# Patient Record
Sex: Female | Born: 1965 | Race: White | Hispanic: No | Smoking: Current every day smoker
Health system: Southern US, Community
[De-identification: ages and names within clinical notes are randomized; demographics above are authoritative.]

## PROBLEM LIST (undated history)

## (undated) DIAGNOSIS — Z9889 Other specified postprocedural states: Secondary | ICD-10-CM

## (undated) DIAGNOSIS — G8929 Other chronic pain: Secondary | ICD-10-CM

## (undated) DIAGNOSIS — G894 Chronic pain syndrome: Secondary | ICD-10-CM

## (undated) DIAGNOSIS — M546 Pain in thoracic spine: Secondary | ICD-10-CM

## (undated) DIAGNOSIS — S22070A Wedge compression fracture of T9-T10 vertebra, initial encounter for closed fracture: Secondary | ICD-10-CM

## (undated) DIAGNOSIS — I051 Rheumatic mitral insufficiency: Secondary | ICD-10-CM

## (undated) DIAGNOSIS — M542 Cervicalgia: Secondary | ICD-10-CM

## (undated) DIAGNOSIS — I340 Nonrheumatic mitral (valve) insufficiency: Secondary | ICD-10-CM

## (undated) DIAGNOSIS — S22070G Wedge compression fracture of T9-T10 vertebra, subsequent encounter for fracture with delayed healing: Secondary | ICD-10-CM

## (undated) DIAGNOSIS — S22000S Wedge compression fracture of unspecified thoracic vertebra, sequela: Secondary | ICD-10-CM

## (undated) DIAGNOSIS — Z1231 Encounter for screening mammogram for malignant neoplasm of breast: Secondary | ICD-10-CM

## (undated) DIAGNOSIS — M79642 Pain in left hand: Secondary | ICD-10-CM

## (undated) DIAGNOSIS — F418 Other specified anxiety disorders: Secondary | ICD-10-CM

## (undated) DIAGNOSIS — Z0000 Encounter for general adult medical examination without abnormal findings: Secondary | ICD-10-CM

## (undated) DIAGNOSIS — R0602 Shortness of breath: Secondary | ICD-10-CM

## (undated) DIAGNOSIS — M545 Low back pain, unspecified: Secondary | ICD-10-CM

## (undated) DIAGNOSIS — M810 Age-related osteoporosis without current pathological fracture: Secondary | ICD-10-CM

## (undated) DIAGNOSIS — J449 Chronic obstructive pulmonary disease, unspecified: Secondary | ICD-10-CM

## (undated) DIAGNOSIS — I2510 Atherosclerotic heart disease of native coronary artery without angina pectoris: Secondary | ICD-10-CM

## (undated) DIAGNOSIS — R911 Solitary pulmonary nodule: Secondary | ICD-10-CM

## (undated) DIAGNOSIS — Z8781 Personal history of (healed) traumatic fracture: Secondary | ICD-10-CM

## (undated) DIAGNOSIS — S22050A Wedge compression fracture of T5-T6 vertebra, initial encounter for closed fracture: Principal | ICD-10-CM

## (undated) DIAGNOSIS — Z01818 Encounter for other preprocedural examination: Secondary | ICD-10-CM

## (undated) DIAGNOSIS — M5441 Lumbago with sciatica, right side: Secondary | ICD-10-CM

## (undated) DIAGNOSIS — M65311 Trigger thumb, right thumb: Secondary | ICD-10-CM

## (undated) DIAGNOSIS — M8008XA Age-related osteoporosis with current pathological fracture, vertebra(e), initial encounter for fracture: Principal | ICD-10-CM

## (undated) DIAGNOSIS — S22000A Wedge compression fracture of unspecified thoracic vertebra, initial encounter for closed fracture: Principal | ICD-10-CM

## (undated) DIAGNOSIS — M5137 Other intervertebral disc degeneration, lumbosacral region: Secondary | ICD-10-CM

## (undated) DIAGNOSIS — G43009 Migraine without aura, not intractable, without status migrainosus: Secondary | ICD-10-CM

## (undated) DIAGNOSIS — M51379 Other intervertebral disc degeneration, lumbosacral region without mention of lumbar back pain or lower extremity pain: Secondary | ICD-10-CM

## (undated) DIAGNOSIS — R10A2 Flank pain, left side: Secondary | ICD-10-CM

## (undated) DIAGNOSIS — F319 Bipolar disorder, unspecified: Secondary | ICD-10-CM

## (undated) DIAGNOSIS — M1990 Unspecified osteoarthritis, unspecified site: Secondary | ICD-10-CM

## (undated) DIAGNOSIS — F4310 Post-traumatic stress disorder, unspecified: Secondary | ICD-10-CM

## (undated) DIAGNOSIS — R109 Unspecified abdominal pain: Secondary | ICD-10-CM

## (undated) DIAGNOSIS — I509 Heart failure, unspecified: Secondary | ICD-10-CM

## (undated) DIAGNOSIS — E7800 Pure hypercholesterolemia, unspecified: Secondary | ICD-10-CM

## (undated) DIAGNOSIS — I4891 Unspecified atrial fibrillation: Secondary | ICD-10-CM

## (undated) HISTORY — PX: VALVE REPLACEMENT: SUR13

## (undated) HISTORY — PX: TUBAL LIGATION: SHX77

## (undated) HISTORY — DX: Heart failure, unspecified: I50.9

## (undated) HISTORY — PX: KNEE ARTHROSCOPY: SUR90

## (undated) HISTORY — PX: CERVICAL SPINE SURGERY: SHX589

## (undated) HISTORY — DX: Pure hypercholesterolemia, unspecified: E78.00

## (undated) HISTORY — PX: ABDOMINAL HYSTERECTOMY: SHX81

## (undated) HISTORY — PX: OOPHORECTOMY: SHX86

## (undated) HISTORY — DX: Unspecified atrial fibrillation: I48.91

---

## 2011-11-09 DIAGNOSIS — J309 Allergic rhinitis, unspecified: Secondary | ICD-10-CM | POA: Insufficient documentation

## 2013-09-25 DIAGNOSIS — R911 Solitary pulmonary nodule: Secondary | ICD-10-CM

## 2013-09-25 HISTORY — DX: Solitary pulmonary nodule: R91.1

## 2013-10-09 DIAGNOSIS — F17200 Nicotine dependence, unspecified, uncomplicated: Secondary | ICD-10-CM | POA: Insufficient documentation

## 2015-06-12 DIAGNOSIS — E785 Hyperlipidemia, unspecified: Secondary | ICD-10-CM | POA: Insufficient documentation

## 2015-07-05 DIAGNOSIS — I5032 Chronic diastolic (congestive) heart failure: Secondary | ICD-10-CM | POA: Insufficient documentation

## 2015-07-10 DIAGNOSIS — I272 Pulmonary hypertension, unspecified: Secondary | ICD-10-CM

## 2015-07-10 HISTORY — DX: Pulmonary hypertension, unspecified: I27.20

## 2015-08-24 DIAGNOSIS — J449 Chronic obstructive pulmonary disease, unspecified: Secondary | ICD-10-CM | POA: Insufficient documentation

## 2015-10-01 DIAGNOSIS — I052 Rheumatic mitral stenosis with insufficiency: Secondary | ICD-10-CM

## 2015-10-01 HISTORY — DX: Rheumatic mitral stenosis with insufficiency: I05.2

## 2015-10-09 NOTE — Anesthesia Post-Procedure Evaluation (Signed)
Formatting of this note might be different from the original.    Patient: Tracie HarrierKaren C Laurel  * No procedures listed *  Anesthesia type: MAC    Patient location:  Phase II  Patient participation:  Patient able to participate in this evaluation at age appropriate level.    Vital signs reviewed and can be found in nursing documentation.    Post vital signs:   stable  Level of consciousness:   awake, alert and oriented    Post-anesthesia pain:   adequate analgesia  Airway patency:   patent  Respiratory:   unassisted, respiration function adequate, spontaneous ventilation, nasal cannula  Cardiovascular:   stable, blood pressure acceptable and heart rate acceptable  Hydration:   adequate hydration  Temperature: temperature adequate >96.72F  PONV:  nausea and vomiting controlled  Regional anesthesia: no block performed    Anesthetic complications:   no  Electronically signed by Kipp BroodLorie A Lisenby, CRNA at 10/09/2015  9:22 AM EDT

## 2015-10-09 NOTE — Anesthesia Pre-Procedure Evaluation (Signed)
Formatting of this note is different from the original.  Images from the original note were not included.  Past Medical History:   Diagnosis Date   ? Acid reflux    ? Allergy    ? Anxiety    ? Asthma    ? Atrial fibrillation (*)    ? Bipolar 1 disorder (*)    ? CHF (congestive heart failure) (*)    ? COPD (chronic obstructive pulmonary disease) (*)    ? DDD (degenerative disc disease)    ? DDD (degenerative disc disease), cervical    ? Diverticulitis    ? H/O steroid therapy    ? Mitral valve disease    ? Osteoporosis    ? Osteoporosis    ? Sciatic nerve pain    ? Scoliosis    ? Scoliosis    ? Seasonal allergies    ? Teeth missing     no uppper teeth     Past Surgical History:   Procedure Laterality Date   ? Cesarean section     ? Hysterectomy     ? Knee surgery     ? Neck surgery       Patient Active Problem List   Diagnosis   ? Allergy   ? Panlobular emphysema (*)   ? Asthma   ? Anxiety   ? Bipolar 1 disorder (*)   ? Allergic rhinitis   ? Antral gastritis   ? Chronic back pain   ? Onychomycosis   ? Nodule of right lung   ? Back pain with right-sided sciatica   ? Nicotine dependence   ? Right-sided low back pain without sciatica   ? Onycholysis   ? Dyslipidemia   ? Paroxysmal atrial fibrillation (*)   ? Diastolic CHF, chronic (*)   ? Moderate to severe pulmonary hypertension (*)   ? COPD (chronic obstructive pulmonary disease) (*)   ? A-fib (*)   ? Non-rheumatic mitral regurgitation   ? Mitral regurgitation and mitral stenosis     Social History   Substance Use Topics   ? Smoking status: Current Every Day Smoker     Packs/day: 0.25     Types: Cigarettes     Start date: 09/26/1978   ? Smokeless tobacco: Former Neurosurgeon     Quit date: 09/27/2015      Comment: 6 per day- smoking cessation sheet and [place] QuitNow number given to patient   ? Alcohol use No     Lab Results   Component Value Date    WBC 8.0 10/07/2015    HGB 9.5 (L) 10/07/2015    HCT 28.1 (L) 10/07/2015    Plt Ct 198 10/07/2015    CHOLESTEROL TOTAL 252 (H)  09/24/2015    Trig 154 (H) 09/24/2015    HDL 63 09/24/2015    LDL 158 (H) 09/24/2015    ALT 12 09/24/2015    AST 18 09/24/2015    Na 139 10/08/2015    Potassium 3.7 10/08/2015    Cl 99 10/08/2015    Creatinine 0.80 10/08/2015    BUN 8 10/08/2015    CO2 27 10/08/2015    TSH 3.85 08/25/2015    INR 2.71 10/09/2015    Glucose 98 10/08/2015    Hgb A1C 5.1 09/24/2015    Microalbum.,U,Random 25.9 06/12/2015     Ecg 12 Lead    Result Date: 08/25/2015  Ventricular Rate 178 Atrial Rate 197 QRS Duration 80 Q-T Interval 228 QTC Calculation(Bezet) 392 Calculated R  Axis 61 Calculated T Axis 123 Diagnosis #### Critical Test Result: High HR Atrial fibrillation with rapid ventricular response Septal infarct , age undetermined Abnormal ECG When compared with ECG of 04-Jul-2015 14:34, No significant change was found [name], [name] [phone]) on 08/25/2015 10:47:19 AM certifies that he/she has reviewed the ECG tracing and confirms the  independent interpretation is correct.    Ecg 12-lead    Result Date: 08/25/2015  Ventricular Rate 122 Atrial Rate 244 QRS Duration 92 Q-T Interval 344 QTC Calculation(Bezet) 490 Calculated P Axis -100 Calculated R Axis 51 Calculated T Axis -98 Diagnosis Atrial flutter with 2:1 AV conduction Marked ST abnormality, possible inferior subendocardial injury Abnormal ECG When compared with ECG of 24-Aug-2015 12:12, Atrial flutter has replaced Atrial fibrillation Criteria for Septal infarct are no longer present ST more depressed in Inferior leads ST more depressed in Anterior leads T wave inversion now evident in Inferior leads    Ecg 12 Lead    Result Date: 07/05/2015  Ventricular Rate 148 Atrial Rate 163 QRS Duration 98 Q-T Interval 260 QTC Calculation(Bezet) 408 Calculated R Axis 52 Calculated T Axis -124 Diagnosis Atrial fibrillation with rapid ventricular response Nonspecific ST and T wave abnormality Abnormal ECG When compared with ECG of 27-Sep-2013 15:08, Atrial fibrillation has replaced Sinus rhythm  Vent. rate has increased BY  56 BPM Nonspecific T wave abnormality now evident in Inferior leads Nonspecific T wave abnormality now evident in Lateral leads [name], [name] ([phone]) on 07/05/2015 9:44:29 AM certifies that he/she has reviewed the ECG tracing and confirms the independent  interpretation is correct.    Ecg 12 Lead Unit Performed    EKG: agree with computer interpretation.  unchanged from previous tracings, normal sinus rhythm, nonspecific ST and T waves changes, likely lvh, left atrial enlargement.     Ecg 12 Lead Unit Performed    EKG: agree with computer interpretation.  unchanged from previous tracings, normal sinus rhythm, nonspecific ST and T waves changes, likely lvh.     Results for orders placed or performed during the hospital encounter of 10/01/15   TEE during CVOR/CATH/EP    Narrative                                        [facility]                                          [address]                                        [address]                                         [id number]                                       [place],                                       [place] [phone]                      CVOR Transesophageal Echocardiogram  Name: Mary, [name] Date: 10/01/2015 09:40 AM  MRN: [id number]            Patient Location: [facility] CVRU^[id number]  DOB: 1965-08-14          Gender: Female  Age: 50 yrs              Race: White or Caucasian  Reason For Study: MV disorders (424.0)    Procedure/Quality  A complete transesophageal echocardiogram was performed (2D, Doppler, and  color flow Doppler). Intraoperative TEE was performed. The Intraoperative  TEE was used to guide the surgical procedure.    Procedure  Summary  Preop Diagnosis: Rheumatic valvuar heart diseaes.  Postop Diagnosis: S/P bioprosthetic MVR.  Procedure: Transesophageal echocardiogram.  Assistants: None.  Estimated Blood Loss: n/a.  Anesthesia: General anesthesia managed by anesthetist/anesthesiologist.  Complications: None.    Interpretation Summary  1. 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  2/ Preserved biventricular function. The left ventricular function is in  the 60% range.    Left Ventricle  The left ventricle is normal in size. There is no thrombus. There is normal  left ventricular wall thickness. Left ventricular systolic function is  normal. Contractile pattern is otherwise normal.    Right Ventricle  The right ventricle is normal size. The right ventricular ejection fraction  is normal.    Atria  The left atrium is severely dilated. No thrombus is detected in the left  atrial appendage. Intact septum. The right atrium is normal.    Mitral Valve  PRE: The mitral valve is grossly abnormal. It is thickened. The posterior  leaflet is tethered. The anterior leaflet has a"hockey stick" deformity c/w  rheumatic heart disease. There is 3+ MR and mild MS    POST: S/P bioprosthetic MVR with normal function.    Tricuspid Valve  The tricuspid valve is not well visualized, but is grossly normal. There is  trace  tricuspid regurgitation.    Aortic Valve  The valve is tricuspid with mild commisural thcikening. There are two jets  of AI that appear 1-2+. No AS seen.    Vessels  The aortic root is normal. The descending aorta is normal. The pulmonary  artery is normal.    Pericardium  There is no pericardial effusion.    ____________________________________________________________________________    Electronically signed by:  Lorna Dibble, M.D.  on 10/02/2015 04:56 PM  Ordering Physician: [id number]^Y Q^^^^^MV  Referring Physician: Hazel Sams  Performed By: Lorna Dibble., M.D.    Results for orders placed  or performed during the hospital encounter of 08/24/15   Echocardiogram Limited    Narrative                                                [facility]                                           [address]                                           [place], [place] [address]                                              [phone]    Adult Echocardiogram  Name: Mary [name], BOUTWELL Date: 08/25/2015 10:45 AM    BP: 117/77 mmHg  MRN: [id number]            Patient Location: [facility] ZDG^[id number]  DOB: Apr 13, 1966          Gender: Female                     Height: 64 in  Age: 53 yrs              Race: White or Caucasian           Weight: 205 lb  Reason For Study: Evaluate mitral valve                                                              BSA: 2.0 m2  Ordering Physician: [id number]^KAMDAR^APUR^R^^^^^EPI  Performed By: Kizzie Ide    Procedure/Quality  A limited 2D echo was performed. The image quality of this study was  technically adequate. The patient is in normal sinus rhythm.    Interpretation Summary  Limited echocardiogram assessement of mitral valve.    1. Left ventricle is normal in size and  function.  2. Mild to moderate mitral stenosis. Calculated MVA is 1.64 cm2 by pressure  half-time. Mean gradient is 7 mmHg. Moderate to moderately severe (2-3+)  mitral regurgitation.  3. Aortic valve is trileaflet with eccentric mild (1+) aortic  regurgitation. May be underestimated given eccentricity of regurgitant jet.    Left Ventricle  The left ventricle is normal in size. There is normal left ventricular wall  thickness. Left ventricular systolic function is normal. Ejection Fraction  = >55%.    Mitral Valve  Thickened leaflets with hockey-stick deformity of the anterior leaflet and  decreased excursion. There is mild mitral stenosis. There is moderate  mitral regurgitation (2+).    Pericardium/Pleural  There is a trivial pericardial effusion of no  hemodynamic significance.    MMode/2D Measurements & Calculations  IVSd: 0.99 cm           LVIDd:      IVS/LVPW: 0.99     Ao root diam:                          5.6 cm      FS: 38.9 %         2.5 cm                          LVIDs:      EDV(Teich):        Ao root area:                          3.4 cm      155.7 ml           4.9 cm2                          LVPWd:      EF(Teich): 68.6 %  LA dimension:                          1.0 cm                         5.0 cm            _____________________________________________________________  LVOT diam: 2.0 cm  LVOT area(traced):  3.1 cm2    Doppler Measurements & Calculations  MV E max vel:       MV max PG:     MV P1/2t max vel:     LV V1 max PG:  181.8 cm/sec        18.3 mmHg      211.7 cm/sec          7.4 mmHg  MV A max vel:       MV mean PG:    MV P1/2t: 133.7 msec  LV V1 mean PG:  80.2 cm/sec         6.5 mmHg       MVA(P1/2t): 1.6 cm2   3.8 mmHg  MV E/A: 2.3         MVA(VTI):      MV dec slope:         LV V1 max:                      1.2 cm2        463.8 cm/sec2  135.9 cm/sec                                     MV dec time: 0.40 sec LV V1 mean:                                                           89.3 cm/sec                                                           LV V1 VTI: 27.0 cm          _____________________________________________________________    SV(LVOT): 82.2 ml    ____________________________________________________________________________    Electronically signed [id number] Meadows  on 08/25/2015 11:41 AM    Transesophageal Echocardiogram    Narrative                                                [facility]                                           [address]                                           [place], [place] [address]                                              [phone]    Transesophageal Echocardiogram  Name: KAORU, BENDA Date: 08/26/2015 09:54 AM     BP:  125/68 mmHg  MRN: [id number]           Patient Location: [facility] W4XL^[id number]  DOB: 09-26-1965         Gender: Female                      Height: 64 in  Age: 59 yrs             Race: White or Caucasian            Weight: 205 lb  Reason For Study: MV disorders (424.0)  BSA: 2.0 m2  Ordering Physician: [id number]^JYNW^G^^^^^NFA  Performed By: Rod Holler    Procedure/Quality A complete transesophageal echocardiogram was performed  (2D, Doppler, and color flow Doppler). The image quality of this study was  technically adequate. The probe was swallowed without complications.  Patient underwent a transesophageal echocardiogram under sedation.    Interpretation Summary  The left ventricle is normal in size.  Left ventricular systolic function is normal.  The right ventricle is normal size.  The right ventricular ejection fraction is normal.  Saline contrast injected, no interatrial shunt.  The mitral valve leaflets appear thickened, but open well.  The mitral valve appears rheumatic.  There is severe mitral regurgitation (4+).  There is mild tricuspid regurgitation (1+).  The aortic valve opens well.  The aortic valve is tri-leaflet.  There is moderate aortic regurgitation (2+).  2 eccentric jets from right coronary cusp, 1 directed anteriorly, 1  directed posteriorly  _______________________________________    Left Ventricle  The left ventricle is normal in size. Left ventricular systolic function is  normal.    Right Ventricle  The right ventricle is normal size. The right ventricular ejection fraction  is normal.    Atria  Saline contrast injected, no interatrial shunt. The right atrium is normal.    Mitral Valve  The mitral valve leaflets appear thickened, but open well. The mitral valve  appears rheumatic.There is severe mitral regurgitation (4+).    Tricuspid Valve  The tricuspid valve leaflets are thin and pliable and the valve motion is  normal.  There is mild tricuspid regurgitation (1+).    Aortic Valve  The aortic valve opens well. The aortic valve is tri-leaflet. There is  moderate aortic regurgitation (2+). 2 eccentric jets from right coronary  cusp, 1 directed anteriorly, 1 directed posteriorly.    Pulmonic Valve  The pulmonic valve leaflets are thin and pliable; valve motion is normal.    Vessels  The aortic root is normal.    Pericardium  There is no pericardial effusion.    ____________________________________________________________________________    Electronically signed OZ:[id number] Kamdar  on 08/26/2015 01:18 PM    Results for orders placed or performed during the hospital encounter of 07/04/15   Echocardiogram 2D Complete    Narrative                                                [facility]                                           [address]                                           [place], [place] [address]  [phone]    Adult Echocardiogram  Name: Mary [name], PANIK Date: 07/05/2015 08:08 AM    BP: 124/68 mmHg  MRN: [id number]            Patient Location: [facility] IMU^[id number]  HR: 66  DOB: [birth year]          Gender: Female                     Height: 65 in  Age: 3 yrs              Race: White or Caucasian           Weight: 202 lb  Reason For Study: Atrial fibrillation (427.31)                                                              BSA: 2.0 m2  Ordering Physician: [id number]^[id number]^^^  Performed By: Merry Lofty    Procedure/Quality  A complete two-dimensional transthoracic echocardiogram was performed (2D,  M-mode, Doppler and color flow Doppler). The image quality of this study  was technically adequate. The patient is in normal sinus rhythm.    Interpretation Summary  Left ventricular systolic function is normal.  The left ventricle is mildly dilated.  Grade II Diastolic Dysfunction (moderate increase in filling  pressure).  LA Volume is c/w moderately dilated (42 ml/m2- 48 ml/m2)  Mild to moderate mitral stenosis  There is moderate mitral regurgitation (2+).  There is mild tricuspid regurgitation (1+).  Right ventricular systolic pressure is elevated at 40-46mm Hg, consistent  with moderate pulmonary hypertension.  Mild aortic regurgitation.    Left Ventricle  The left ventricle is mildly dilated. There is no thrombus. There is normal  left ventricular wall thickness. The left ventricular ejection fraction is  grossly normal. Ejection Fraction = >55%. Grade II Diastolic Dysfunction  (moderate increase in filling pressure). Left ventricular systolic function  is normal. The left ventricular wall motion is normal.    Right Ventricle  The right ventricle is normal size. The right ventricular ejection fraction  is grossly normal.    Atria  LA Volume is c/w moderately dilated (42 ml/m2- 48 ml/m2). The right atrium  is mildly dilated. The interatrial septum is intact with no evidence for an  atrial septal defect.    Mitral Valve  The mitral valve leaflets appear thickened, but open well. The Mean Mitral  Valve Gradient is 11 mmHg. The Mitral Valve Area by Pressure 1/2 time is  1.95 cm2. Mild to moderate mitral stenosis. There is moderate mitral  regurgitation (2+).    Tricuspid Valve  The tricuspid valve leaflets are thin and pliable and the valve motion is  normal. There is no tricuspid valve vegetation. There is no tricuspid  stenosis. There is mild tricuspid regurgitation (1+). Right ventricular  systolic pressure is elevated at 40-69mm Hg, consistent with moderate  pulmonary hypertension.    Aortic Valve  The aortic valve is normal in structure and function. The aortic valve is  trileaflet. There is no aortic valvular vegetation. No hemodynamically  significant valvular aortic stenosis. Mild aortic regurgitation.    Pulmonic Valve  The pulmonic valve is not well visualized. There is trace pulmonic  valvular  regurgitation.    Great Vessels  The  aortic root is normal size.    Pericardium/Pleural  There is a trivial pericardial effusion of no hemodynamic significance.  There is no pleural effusion.    MMode/2D Measurements & Calculations  IVSd: 0.91 cm             LVIDd: 5.5 cm IVS/LVPW: 1.2 Ao root diam: 2.9 cm                            LVIDs: 2.8 cm FS: 49.6 %    Ao root area: 6.7 cm2                            LVPWd: 0.75 cm              LA dimension: 4.8 cm            _____________________________________________________________  LVOT diam: 1.8 cm    LVOT area(traced): 2.5 cm2    Doppler Measurements & Calculations  MV E max vel:      MV max PG:        MV P1/2t max vel:    Ao V2 max:  170.5 cm/sec       22.2 mmHg         236.6 cm/sec         193.0 cm/sec  MV A max vel:      MV mean PG:       MV P1/2t: 112.7 msec Ao max PG:  142.1 cm/sec       10.6 mmHg         MVA(P1/2t): 2.0 cm2  14.9 mmHg  MV E/A: 1.2        MVA(VTI): 1.0 cm2 MV dec slope:        Ao mean PG:                                                            9.3 mmHg                                       614.9 cm/sec2        AVA(V,D): 2.0 cm2                                       MV dec time:                                       0.40 sec          _____________________________________________________________  AI max vel:        LV V1 max PG:     SV(LVOT): 80.7 ml    TR max vel:  441.9 cm/sec       9.2 mmHg                               313.7 cm/sec  AI max PG:  LV V1 mean PG:                         TR max PG:  78.1 mmHg          4.9 mmHg                               39.4 mmHg  AI dec slope:      LV V1 max:                     152.0 cm/sec  316.8 cm/sec2      LV V1 mean:  AI P1/2t:          103.5 cm/sec  408.5 msec         LV V1 VTI:                     31.2 cm    ____________________________________________________________________________    Electronically signed [id number] Slotwiner  on 07/05/2015 12:12 PM       Anesthesia  Evaluation     Patient summary reviewed and Nursing notes reviewed    Airway   Mallampati: II  TM distance: >3 FB  Neck ROM: full  Dental    (+) upper dentures       Pulmonary    (+) COPD moderate, asthma, decreased breath sounds, tobacco use pack-years,   Cardiovascular   Exercise tolerance: <4 METS (MV replaced over a week ago, pt has not been discharged from hospital yet)  (+) CHF, Valvular problems, MR,     ECG reviewed  Rhythm: regular  Rate: normal    Neuro/Psych    (+) neuromuscular disease, psychiatric history anxiety, bipolar disorder    GI/Hepatic/Renal - negative ROS   (+) GERD poorly controlled,     Endo/Other    (+) arthritis,   Abdominal  - normal exam     Other findings: +smoker's cough.   Other ROS/MED HX:  +bipolar  ANXIETY         Anesthesia Plan    intravenous induction   ASA 3     MAC   (I discussed IV propofol with spontaneous breathing. We discussed possible sore throat, hoarseness, and cough. She understands and agrees. )  Anesthetic plan and risks discussed with patient.    Plan discussed with attending.    Date of last liquid: 10/08/15     Date of last solid: 10/08/15         Electronically signed by Kipp Brood, CRNA at 10/09/2015  9:07 AM EDT

## 2015-12-18 DIAGNOSIS — Z952 Presence of prosthetic heart valve: Secondary | ICD-10-CM

## 2015-12-18 DIAGNOSIS — Z7901 Long term (current) use of anticoagulants: Secondary | ICD-10-CM | POA: Insufficient documentation

## 2015-12-18 HISTORY — DX: Presence of prosthetic heart valve: Z95.2

## 2016-05-24 DIAGNOSIS — F329 Major depressive disorder, single episode, unspecified: Secondary | ICD-10-CM | POA: Diagnosis not present

## 2016-05-24 DIAGNOSIS — I480 Paroxysmal atrial fibrillation: Secondary | ICD-10-CM | POA: Diagnosis not present

## 2016-05-24 DIAGNOSIS — I272 Pulmonary hypertension, unspecified: Secondary | ICD-10-CM | POA: Diagnosis not present

## 2016-05-24 DIAGNOSIS — R072 Precordial pain: Secondary | ICD-10-CM | POA: Diagnosis not present

## 2016-05-24 DIAGNOSIS — R0602 Shortness of breath: Secondary | ICD-10-CM | POA: Diagnosis not present

## 2016-05-24 DIAGNOSIS — R799 Abnormal finding of blood chemistry, unspecified: Secondary | ICD-10-CM | POA: Diagnosis not present

## 2016-05-24 DIAGNOSIS — I5032 Chronic diastolic (congestive) heart failure: Secondary | ICD-10-CM | POA: Diagnosis not present

## 2016-05-25 DIAGNOSIS — Z7901 Long term (current) use of anticoagulants: Secondary | ICD-10-CM | POA: Diagnosis not present

## 2016-06-14 DIAGNOSIS — F319 Bipolar disorder, unspecified: Secondary | ICD-10-CM | POA: Diagnosis not present

## 2016-06-14 DIAGNOSIS — I480 Paroxysmal atrial fibrillation: Secondary | ICD-10-CM | POA: Diagnosis not present

## 2016-06-14 DIAGNOSIS — F17210 Nicotine dependence, cigarettes, uncomplicated: Secondary | ICD-10-CM | POA: Diagnosis not present

## 2016-06-14 DIAGNOSIS — I272 Pulmonary hypertension, unspecified: Secondary | ICD-10-CM | POA: Diagnosis not present

## 2016-06-28 DIAGNOSIS — Z952 Presence of prosthetic heart valve: Secondary | ICD-10-CM | POA: Diagnosis not present

## 2016-07-22 DIAGNOSIS — J441 Chronic obstructive pulmonary disease with (acute) exacerbation: Secondary | ICD-10-CM | POA: Diagnosis not present

## 2016-07-22 DIAGNOSIS — R52 Pain, unspecified: Secondary | ICD-10-CM | POA: Diagnosis not present

## 2016-07-22 DIAGNOSIS — R0602 Shortness of breath: Secondary | ICD-10-CM | POA: Diagnosis not present

## 2016-07-26 DIAGNOSIS — J431 Panlobular emphysema: Secondary | ICD-10-CM | POA: Diagnosis not present

## 2016-07-26 DIAGNOSIS — F319 Bipolar disorder, unspecified: Secondary | ICD-10-CM | POA: Diagnosis not present

## 2016-08-13 DIAGNOSIS — J019 Acute sinusitis, unspecified: Secondary | ICD-10-CM | POA: Diagnosis not present

## 2016-08-13 DIAGNOSIS — Z7982 Long term (current) use of aspirin: Secondary | ICD-10-CM | POA: Diagnosis not present

## 2016-08-13 DIAGNOSIS — K219 Gastro-esophageal reflux disease without esophagitis: Secondary | ICD-10-CM | POA: Diagnosis not present

## 2016-08-13 DIAGNOSIS — J329 Chronic sinusitis, unspecified: Secondary | ICD-10-CM | POA: Diagnosis not present

## 2016-08-13 DIAGNOSIS — J441 Chronic obstructive pulmonary disease with (acute) exacerbation: Secondary | ICD-10-CM | POA: Diagnosis not present

## 2016-08-13 DIAGNOSIS — Z7951 Long term (current) use of inhaled steroids: Secondary | ICD-10-CM | POA: Diagnosis not present

## 2016-08-13 DIAGNOSIS — Z79899 Other long term (current) drug therapy: Secondary | ICD-10-CM | POA: Diagnosis not present

## 2016-08-13 DIAGNOSIS — F17210 Nicotine dependence, cigarettes, uncomplicated: Secondary | ICD-10-CM | POA: Diagnosis not present

## 2016-08-13 DIAGNOSIS — R0602 Shortness of breath: Secondary | ICD-10-CM | POA: Diagnosis not present

## 2016-08-13 DIAGNOSIS — R062 Wheezing: Secondary | ICD-10-CM | POA: Diagnosis not present

## 2016-08-13 DIAGNOSIS — I4891 Unspecified atrial fibrillation: Secondary | ICD-10-CM | POA: Diagnosis not present

## 2016-08-13 DIAGNOSIS — M419 Scoliosis, unspecified: Secondary | ICD-10-CM | POA: Diagnosis not present

## 2016-08-13 DIAGNOSIS — Z7901 Long term (current) use of anticoagulants: Secondary | ICD-10-CM | POA: Diagnosis not present

## 2016-08-13 DIAGNOSIS — M810 Age-related osteoporosis without current pathological fracture: Secondary | ICD-10-CM | POA: Diagnosis not present

## 2016-08-13 DIAGNOSIS — M5030 Other cervical disc degeneration, unspecified cervical region: Secondary | ICD-10-CM | POA: Diagnosis not present

## 2016-08-13 DIAGNOSIS — Z888 Allergy status to other drugs, medicaments and biological substances status: Secondary | ICD-10-CM | POA: Diagnosis not present

## 2016-08-13 DIAGNOSIS — Z91048 Other nonmedicinal substance allergy status: Secondary | ICD-10-CM | POA: Diagnosis not present

## 2016-08-13 DIAGNOSIS — R0902 Hypoxemia: Secondary | ICD-10-CM | POA: Diagnosis not present

## 2016-08-13 DIAGNOSIS — F319 Bipolar disorder, unspecified: Secondary | ICD-10-CM | POA: Diagnosis not present

## 2016-08-22 DIAGNOSIS — Z7901 Long term (current) use of anticoagulants: Secondary | ICD-10-CM | POA: Diagnosis not present

## 2016-08-25 DIAGNOSIS — I480 Paroxysmal atrial fibrillation: Secondary | ICD-10-CM | POA: Diagnosis not present

## 2016-08-25 DIAGNOSIS — Z7901 Long term (current) use of anticoagulants: Secondary | ICD-10-CM | POA: Diagnosis not present

## 2016-09-03 DIAGNOSIS — Z7982 Long term (current) use of aspirin: Secondary | ICD-10-CM | POA: Diagnosis not present

## 2016-09-03 DIAGNOSIS — Z952 Presence of prosthetic heart valve: Secondary | ICD-10-CM | POA: Diagnosis not present

## 2016-09-03 DIAGNOSIS — M5030 Other cervical disc degeneration, unspecified cervical region: Secondary | ICD-10-CM | POA: Diagnosis not present

## 2016-09-03 DIAGNOSIS — I059 Rheumatic mitral valve disease, unspecified: Secondary | ICD-10-CM | POA: Diagnosis not present

## 2016-09-03 DIAGNOSIS — S63502A Unspecified sprain of left wrist, initial encounter: Secondary | ICD-10-CM | POA: Diagnosis not present

## 2016-09-03 DIAGNOSIS — F419 Anxiety disorder, unspecified: Secondary | ICD-10-CM | POA: Diagnosis not present

## 2016-09-03 DIAGNOSIS — W010XXA Fall on same level from slipping, tripping and stumbling without subsequent striking against object, initial encounter: Secondary | ICD-10-CM | POA: Diagnosis not present

## 2016-09-03 DIAGNOSIS — K219 Gastro-esophageal reflux disease without esophagitis: Secondary | ICD-10-CM | POA: Diagnosis not present

## 2016-09-03 DIAGNOSIS — F319 Bipolar disorder, unspecified: Secondary | ICD-10-CM | POA: Diagnosis not present

## 2016-09-03 DIAGNOSIS — M25532 Pain in left wrist: Secondary | ICD-10-CM | POA: Diagnosis not present

## 2016-09-03 DIAGNOSIS — Z79899 Other long term (current) drug therapy: Secondary | ICD-10-CM | POA: Diagnosis not present

## 2016-09-03 DIAGNOSIS — M810 Age-related osteoporosis without current pathological fracture: Secondary | ICD-10-CM | POA: Diagnosis not present

## 2016-09-03 DIAGNOSIS — Z90710 Acquired absence of both cervix and uterus: Secondary | ICD-10-CM | POA: Diagnosis not present

## 2016-09-03 DIAGNOSIS — S6992XA Unspecified injury of left wrist, hand and finger(s), initial encounter: Secondary | ICD-10-CM | POA: Diagnosis not present

## 2016-09-03 DIAGNOSIS — I509 Heart failure, unspecified: Secondary | ICD-10-CM | POA: Diagnosis not present

## 2016-09-03 DIAGNOSIS — Z7901 Long term (current) use of anticoagulants: Secondary | ICD-10-CM | POA: Diagnosis not present

## 2016-09-03 DIAGNOSIS — M419 Scoliosis, unspecified: Secondary | ICD-10-CM | POA: Diagnosis not present

## 2016-09-03 DIAGNOSIS — M25539 Pain in unspecified wrist: Secondary | ICD-10-CM | POA: Diagnosis not present

## 2016-09-03 DIAGNOSIS — J449 Chronic obstructive pulmonary disease, unspecified: Secondary | ICD-10-CM | POA: Diagnosis not present

## 2016-09-03 DIAGNOSIS — Z888 Allergy status to other drugs, medicaments and biological substances status: Secondary | ICD-10-CM | POA: Diagnosis not present

## 2016-09-03 DIAGNOSIS — I4891 Unspecified atrial fibrillation: Secondary | ICD-10-CM | POA: Diagnosis not present

## 2016-09-03 DIAGNOSIS — F17210 Nicotine dependence, cigarettes, uncomplicated: Secondary | ICD-10-CM | POA: Diagnosis not present

## 2016-09-06 DIAGNOSIS — Z7901 Long term (current) use of anticoagulants: Secondary | ICD-10-CM | POA: Diagnosis not present

## 2016-09-08 DIAGNOSIS — J449 Chronic obstructive pulmonary disease, unspecified: Secondary | ICD-10-CM | POA: Diagnosis not present

## 2016-09-10 DIAGNOSIS — R51 Headache: Secondary | ICD-10-CM | POA: Diagnosis not present

## 2016-09-10 DIAGNOSIS — S064X0A Epidural hemorrhage without loss of consciousness, initial encounter: Secondary | ICD-10-CM | POA: Diagnosis not present

## 2016-09-10 DIAGNOSIS — S098XXA Other specified injuries of head, initial encounter: Secondary | ICD-10-CM | POA: Diagnosis not present

## 2016-09-10 DIAGNOSIS — S0190XA Unspecified open wound of unspecified part of head, initial encounter: Secondary | ICD-10-CM | POA: Diagnosis not present

## 2016-09-10 DIAGNOSIS — S0990XA Unspecified injury of head, initial encounter: Secondary | ICD-10-CM | POA: Diagnosis not present

## 2016-09-13 DIAGNOSIS — J449 Chronic obstructive pulmonary disease, unspecified: Secondary | ICD-10-CM | POA: Diagnosis not present

## 2016-09-16 DIAGNOSIS — Z7901 Long term (current) use of anticoagulants: Secondary | ICD-10-CM | POA: Diagnosis not present

## 2016-09-16 DIAGNOSIS — I480 Paroxysmal atrial fibrillation: Secondary | ICD-10-CM | POA: Diagnosis not present

## 2016-09-19 DIAGNOSIS — M545 Low back pain: Secondary | ICD-10-CM | POA: Diagnosis not present

## 2016-09-19 DIAGNOSIS — M533 Sacrococcygeal disorders, not elsewhere classified: Secondary | ICD-10-CM | POA: Diagnosis not present

## 2016-09-19 DIAGNOSIS — Z1231 Encounter for screening mammogram for malignant neoplasm of breast: Secondary | ICD-10-CM | POA: Diagnosis not present

## 2016-09-19 DIAGNOSIS — F17210 Nicotine dependence, cigarettes, uncomplicated: Secondary | ICD-10-CM | POA: Diagnosis not present

## 2016-09-19 DIAGNOSIS — Z1211 Encounter for screening for malignant neoplasm of colon: Secondary | ICD-10-CM | POA: Diagnosis not present

## 2016-09-19 DIAGNOSIS — Z1212 Encounter for screening for malignant neoplasm of rectum: Secondary | ICD-10-CM | POA: Diagnosis not present

## 2016-09-19 DIAGNOSIS — G8929 Other chronic pain: Secondary | ICD-10-CM | POA: Diagnosis not present

## 2016-10-13 DIAGNOSIS — I480 Paroxysmal atrial fibrillation: Secondary | ICD-10-CM | POA: Diagnosis not present

## 2016-10-13 DIAGNOSIS — Z7901 Long term (current) use of anticoagulants: Secondary | ICD-10-CM | POA: Diagnosis not present

## 2016-11-03 DIAGNOSIS — Z1231 Encounter for screening mammogram for malignant neoplasm of breast: Secondary | ICD-10-CM | POA: Diagnosis not present

## 2016-11-11 DIAGNOSIS — I480 Paroxysmal atrial fibrillation: Secondary | ICD-10-CM | POA: Diagnosis not present

## 2016-11-11 DIAGNOSIS — Z7901 Long term (current) use of anticoagulants: Secondary | ICD-10-CM | POA: Diagnosis not present

## 2016-11-24 DIAGNOSIS — R928 Other abnormal and inconclusive findings on diagnostic imaging of breast: Secondary | ICD-10-CM | POA: Diagnosis not present

## 2016-12-09 DIAGNOSIS — Z7901 Long term (current) use of anticoagulants: Secondary | ICD-10-CM | POA: Diagnosis not present

## 2016-12-09 DIAGNOSIS — I480 Paroxysmal atrial fibrillation: Secondary | ICD-10-CM | POA: Diagnosis not present

## 2016-12-26 DIAGNOSIS — R079 Chest pain, unspecified: Secondary | ICD-10-CM | POA: Diagnosis not present

## 2016-12-26 DIAGNOSIS — F319 Bipolar disorder, unspecified: Secondary | ICD-10-CM | POA: Diagnosis not present

## 2016-12-26 DIAGNOSIS — I480 Paroxysmal atrial fibrillation: Secondary | ICD-10-CM | POA: Diagnosis not present

## 2016-12-26 DIAGNOSIS — M25512 Pain in left shoulder: Secondary | ICD-10-CM | POA: Diagnosis not present

## 2016-12-26 DIAGNOSIS — M533 Sacrococcygeal disorders, not elsewhere classified: Secondary | ICD-10-CM | POA: Diagnosis not present

## 2016-12-26 DIAGNOSIS — M79672 Pain in left foot: Secondary | ICD-10-CM | POA: Diagnosis not present

## 2016-12-26 DIAGNOSIS — S2231XD Fracture of one rib, right side, subsequent encounter for fracture with routine healing: Secondary | ICD-10-CM | POA: Diagnosis not present

## 2016-12-26 DIAGNOSIS — N61 Mastitis without abscess: Secondary | ICD-10-CM | POA: Diagnosis not present

## 2017-01-05 DIAGNOSIS — Z7901 Long term (current) use of anticoagulants: Secondary | ICD-10-CM | POA: Diagnosis not present

## 2017-01-05 DIAGNOSIS — I480 Paroxysmal atrial fibrillation: Secondary | ICD-10-CM | POA: Diagnosis not present

## 2017-01-06 DIAGNOSIS — F319 Bipolar disorder, unspecified: Secondary | ICD-10-CM | POA: Diagnosis not present

## 2017-01-06 DIAGNOSIS — F17218 Nicotine dependence, cigarettes, with other nicotine-induced disorders: Secondary | ICD-10-CM | POA: Diagnosis not present

## 2017-02-03 DIAGNOSIS — Z7901 Long term (current) use of anticoagulants: Secondary | ICD-10-CM | POA: Diagnosis not present

## 2017-02-03 DIAGNOSIS — I480 Paroxysmal atrial fibrillation: Secondary | ICD-10-CM | POA: Diagnosis not present

## 2017-02-07 DIAGNOSIS — F314 Bipolar disorder, current episode depressed, severe, without psychotic features: Secondary | ICD-10-CM

## 2017-02-07 DIAGNOSIS — F17218 Nicotine dependence, cigarettes, with other nicotine-induced disorders: Secondary | ICD-10-CM | POA: Diagnosis not present

## 2017-02-07 DIAGNOSIS — F4310 Post-traumatic stress disorder, unspecified: Secondary | ICD-10-CM | POA: Diagnosis not present

## 2017-02-07 HISTORY — DX: Bipolar disorder, current episode depressed, severe, without psychotic features: F31.4

## 2017-02-17 DIAGNOSIS — R102 Pelvic and perineal pain: Secondary | ICD-10-CM | POA: Diagnosis not present

## 2017-02-17 DIAGNOSIS — F319 Bipolar disorder, unspecified: Secondary | ICD-10-CM | POA: Diagnosis not present

## 2017-02-17 DIAGNOSIS — S0990XA Unspecified injury of head, initial encounter: Secondary | ICD-10-CM | POA: Diagnosis not present

## 2017-02-17 DIAGNOSIS — R51 Headache: Secondary | ICD-10-CM | POA: Diagnosis not present

## 2017-02-17 DIAGNOSIS — M25561 Pain in right knee: Secondary | ICD-10-CM | POA: Diagnosis not present

## 2017-02-17 DIAGNOSIS — Z7982 Long term (current) use of aspirin: Secondary | ICD-10-CM | POA: Diagnosis not present

## 2017-02-17 DIAGNOSIS — F17210 Nicotine dependence, cigarettes, uncomplicated: Secondary | ICD-10-CM | POA: Diagnosis not present

## 2017-02-17 DIAGNOSIS — J449 Chronic obstructive pulmonary disease, unspecified: Secondary | ICD-10-CM | POA: Diagnosis not present

## 2017-02-17 DIAGNOSIS — Z043 Encounter for examination and observation following other accident: Secondary | ICD-10-CM | POA: Diagnosis not present

## 2017-02-17 DIAGNOSIS — M810 Age-related osteoporosis without current pathological fracture: Secondary | ICD-10-CM | POA: Diagnosis not present

## 2017-02-17 DIAGNOSIS — I4891 Unspecified atrial fibrillation: Secondary | ICD-10-CM | POA: Diagnosis not present

## 2017-02-17 DIAGNOSIS — Z79899 Other long term (current) drug therapy: Secondary | ICD-10-CM | POA: Diagnosis not present

## 2017-02-17 DIAGNOSIS — Z888 Allergy status to other drugs, medicaments and biological substances status: Secondary | ICD-10-CM | POA: Diagnosis not present

## 2017-02-17 DIAGNOSIS — R0781 Pleurodynia: Secondary | ICD-10-CM | POA: Diagnosis not present

## 2017-02-17 DIAGNOSIS — Z9109 Other allergy status, other than to drugs and biological substances: Secondary | ICD-10-CM | POA: Diagnosis not present

## 2017-02-17 DIAGNOSIS — M953 Acquired deformity of neck: Secondary | ICD-10-CM | POA: Diagnosis not present

## 2017-02-17 DIAGNOSIS — Z7901 Long term (current) use of anticoagulants: Secondary | ICD-10-CM | POA: Diagnosis not present

## 2017-02-17 DIAGNOSIS — Z7951 Long term (current) use of inhaled steroids: Secondary | ICD-10-CM | POA: Diagnosis not present

## 2017-02-17 DIAGNOSIS — K219 Gastro-esophageal reflux disease without esophagitis: Secondary | ICD-10-CM | POA: Diagnosis not present

## 2017-02-17 DIAGNOSIS — I509 Heart failure, unspecified: Secondary | ICD-10-CM | POA: Diagnosis not present

## 2017-02-17 DIAGNOSIS — S299XXA Unspecified injury of thorax, initial encounter: Secondary | ICD-10-CM | POA: Diagnosis not present

## 2017-02-17 DIAGNOSIS — R1084 Generalized abdominal pain: Secondary | ICD-10-CM | POA: Diagnosis not present

## 2017-02-17 DIAGNOSIS — M25562 Pain in left knee: Secondary | ICD-10-CM | POA: Diagnosis not present

## 2017-03-09 DIAGNOSIS — I480 Paroxysmal atrial fibrillation: Secondary | ICD-10-CM | POA: Diagnosis not present

## 2017-03-09 DIAGNOSIS — Z7901 Long term (current) use of anticoagulants: Secondary | ICD-10-CM | POA: Diagnosis not present

## 2017-04-18 DIAGNOSIS — D72829 Elevated white blood cell count, unspecified: Secondary | ICD-10-CM | POA: Diagnosis not present

## 2017-04-18 DIAGNOSIS — F17210 Nicotine dependence, cigarettes, uncomplicated: Secondary | ICD-10-CM | POA: Diagnosis not present

## 2017-04-18 DIAGNOSIS — J302 Other seasonal allergic rhinitis: Secondary | ICD-10-CM | POA: Diagnosis not present

## 2017-04-18 DIAGNOSIS — I4891 Unspecified atrial fibrillation: Secondary | ICD-10-CM | POA: Diagnosis not present

## 2017-04-18 DIAGNOSIS — I509 Heart failure, unspecified: Secondary | ICD-10-CM | POA: Diagnosis not present

## 2017-04-18 DIAGNOSIS — F319 Bipolar disorder, unspecified: Secondary | ICD-10-CM | POA: Diagnosis not present

## 2017-04-18 DIAGNOSIS — R069 Unspecified abnormalities of breathing: Secondary | ICD-10-CM | POA: Diagnosis not present

## 2017-04-18 DIAGNOSIS — R0602 Shortness of breath: Secondary | ICD-10-CM | POA: Diagnosis not present

## 2017-04-18 DIAGNOSIS — F419 Anxiety disorder, unspecified: Secondary | ICD-10-CM | POA: Diagnosis not present

## 2017-04-18 DIAGNOSIS — K219 Gastro-esophageal reflux disease without esophagitis: Secondary | ICD-10-CM | POA: Diagnosis not present

## 2017-04-18 DIAGNOSIS — J441 Chronic obstructive pulmonary disease with (acute) exacerbation: Secondary | ICD-10-CM | POA: Diagnosis not present

## 2017-04-18 DIAGNOSIS — Z952 Presence of prosthetic heart valve: Secondary | ICD-10-CM | POA: Diagnosis not present

## 2017-04-18 DIAGNOSIS — Z7982 Long term (current) use of aspirin: Secondary | ICD-10-CM | POA: Diagnosis not present

## 2017-04-18 DIAGNOSIS — Z7951 Long term (current) use of inhaled steroids: Secondary | ICD-10-CM | POA: Diagnosis not present

## 2017-04-18 DIAGNOSIS — J440 Chronic obstructive pulmonary disease with acute lower respiratory infection: Secondary | ICD-10-CM | POA: Diagnosis not present

## 2017-04-18 DIAGNOSIS — Z888 Allergy status to other drugs, medicaments and biological substances status: Secondary | ICD-10-CM | POA: Diagnosis not present

## 2017-04-18 DIAGNOSIS — Z79899 Other long term (current) drug therapy: Secondary | ICD-10-CM | POA: Diagnosis not present

## 2017-04-18 DIAGNOSIS — J209 Acute bronchitis, unspecified: Secondary | ICD-10-CM | POA: Diagnosis not present

## 2017-04-18 DIAGNOSIS — Z7901 Long term (current) use of anticoagulants: Secondary | ICD-10-CM | POA: Diagnosis not present

## 2017-04-21 DIAGNOSIS — I480 Paroxysmal atrial fibrillation: Secondary | ICD-10-CM | POA: Diagnosis not present

## 2017-04-21 DIAGNOSIS — Z7901 Long term (current) use of anticoagulants: Secondary | ICD-10-CM | POA: Diagnosis not present

## 2017-04-28 DIAGNOSIS — F17210 Nicotine dependence, cigarettes, uncomplicated: Secondary | ICD-10-CM | POA: Diagnosis not present

## 2017-04-28 DIAGNOSIS — E669 Obesity, unspecified: Secondary | ICD-10-CM | POA: Diagnosis not present

## 2017-04-28 DIAGNOSIS — M545 Low back pain: Secondary | ICD-10-CM | POA: Diagnosis not present

## 2017-04-28 DIAGNOSIS — Z1211 Encounter for screening for malignant neoplasm of colon: Secondary | ICD-10-CM | POA: Diagnosis not present

## 2017-04-28 DIAGNOSIS — F314 Bipolar disorder, current episode depressed, severe, without psychotic features: Secondary | ICD-10-CM | POA: Diagnosis not present

## 2017-04-28 DIAGNOSIS — G8929 Other chronic pain: Secondary | ICD-10-CM | POA: Diagnosis not present

## 2017-05-16 DIAGNOSIS — K0889 Other specified disorders of teeth and supporting structures: Secondary | ICD-10-CM | POA: Diagnosis not present

## 2017-05-16 DIAGNOSIS — I4891 Unspecified atrial fibrillation: Secondary | ICD-10-CM | POA: Diagnosis not present

## 2017-05-16 DIAGNOSIS — F319 Bipolar disorder, unspecified: Secondary | ICD-10-CM | POA: Diagnosis not present

## 2017-05-16 DIAGNOSIS — I509 Heart failure, unspecified: Secondary | ICD-10-CM | POA: Diagnosis not present

## 2017-05-16 DIAGNOSIS — M5030 Other cervical disc degeneration, unspecified cervical region: Secondary | ICD-10-CM | POA: Diagnosis not present

## 2017-05-16 DIAGNOSIS — J449 Chronic obstructive pulmonary disease, unspecified: Secondary | ICD-10-CM | POA: Diagnosis not present

## 2017-05-16 DIAGNOSIS — Z7901 Long term (current) use of anticoagulants: Secondary | ICD-10-CM | POA: Diagnosis not present

## 2017-05-16 DIAGNOSIS — K120 Recurrent oral aphthae: Secondary | ICD-10-CM | POA: Diagnosis not present

## 2017-05-16 DIAGNOSIS — Z7982 Long term (current) use of aspirin: Secondary | ICD-10-CM | POA: Diagnosis not present

## 2017-05-16 DIAGNOSIS — Z79899 Other long term (current) drug therapy: Secondary | ICD-10-CM | POA: Diagnosis not present

## 2017-05-16 DIAGNOSIS — F17210 Nicotine dependence, cigarettes, uncomplicated: Secondary | ICD-10-CM | POA: Diagnosis not present

## 2017-05-16 DIAGNOSIS — M810 Age-related osteoporosis without current pathological fracture: Secondary | ICD-10-CM | POA: Diagnosis not present

## 2017-05-16 DIAGNOSIS — Z7951 Long term (current) use of inhaled steroids: Secondary | ICD-10-CM | POA: Diagnosis not present

## 2017-05-16 DIAGNOSIS — R6884 Jaw pain: Secondary | ICD-10-CM | POA: Diagnosis not present

## 2017-05-16 DIAGNOSIS — Z91018 Allergy to other foods: Secondary | ICD-10-CM | POA: Diagnosis not present

## 2017-05-22 DIAGNOSIS — Z7901 Long term (current) use of anticoagulants: Secondary | ICD-10-CM | POA: Diagnosis not present

## 2017-05-22 DIAGNOSIS — I480 Paroxysmal atrial fibrillation: Secondary | ICD-10-CM | POA: Diagnosis not present

## 2017-06-13 DIAGNOSIS — Z7901 Long term (current) use of anticoagulants: Secondary | ICD-10-CM | POA: Diagnosis not present

## 2017-06-29 DIAGNOSIS — Z7901 Long term (current) use of anticoagulants: Secondary | ICD-10-CM | POA: Diagnosis not present

## 2017-06-29 DIAGNOSIS — I480 Paroxysmal atrial fibrillation: Secondary | ICD-10-CM | POA: Diagnosis not present

## 2017-07-03 DIAGNOSIS — F319 Bipolar disorder, unspecified: Secondary | ICD-10-CM | POA: Diagnosis not present

## 2017-07-03 DIAGNOSIS — F17210 Nicotine dependence, cigarettes, uncomplicated: Secondary | ICD-10-CM | POA: Diagnosis not present

## 2017-07-03 DIAGNOSIS — M810 Age-related osteoporosis without current pathological fracture: Secondary | ICD-10-CM | POA: Diagnosis not present

## 2017-07-03 DIAGNOSIS — Z91048 Other nonmedicinal substance allergy status: Secondary | ICD-10-CM | POA: Diagnosis not present

## 2017-07-03 DIAGNOSIS — K0889 Other specified disorders of teeth and supporting structures: Secondary | ICD-10-CM | POA: Diagnosis not present

## 2017-07-03 DIAGNOSIS — Z7901 Long term (current) use of anticoagulants: Secondary | ICD-10-CM | POA: Diagnosis not present

## 2017-07-03 DIAGNOSIS — F419 Anxiety disorder, unspecified: Secondary | ICD-10-CM | POA: Diagnosis not present

## 2017-07-03 DIAGNOSIS — M5030 Other cervical disc degeneration, unspecified cervical region: Secondary | ICD-10-CM | POA: Diagnosis not present

## 2017-07-03 DIAGNOSIS — Z888 Allergy status to other drugs, medicaments and biological substances status: Secondary | ICD-10-CM | POA: Diagnosis not present

## 2017-07-03 DIAGNOSIS — I509 Heart failure, unspecified: Secondary | ICD-10-CM | POA: Diagnosis not present

## 2017-07-03 DIAGNOSIS — K219 Gastro-esophageal reflux disease without esophagitis: Secondary | ICD-10-CM | POA: Diagnosis not present

## 2017-07-03 DIAGNOSIS — J449 Chronic obstructive pulmonary disease, unspecified: Secondary | ICD-10-CM | POA: Diagnosis not present

## 2017-07-03 DIAGNOSIS — I4891 Unspecified atrial fibrillation: Secondary | ICD-10-CM | POA: Diagnosis not present

## 2017-07-03 DIAGNOSIS — Z79899 Other long term (current) drug therapy: Secondary | ICD-10-CM | POA: Diagnosis not present

## 2017-07-05 DIAGNOSIS — M7989 Other specified soft tissue disorders: Secondary | ICD-10-CM | POA: Diagnosis not present

## 2017-07-05 DIAGNOSIS — I480 Paroxysmal atrial fibrillation: Secondary | ICD-10-CM | POA: Diagnosis not present

## 2017-07-05 DIAGNOSIS — I5032 Chronic diastolic (congestive) heart failure: Secondary | ICD-10-CM | POA: Diagnosis not present

## 2017-07-05 DIAGNOSIS — M79631 Pain in right forearm: Secondary | ICD-10-CM | POA: Diagnosis not present

## 2017-07-05 DIAGNOSIS — K047 Periapical abscess without sinus: Secondary | ICD-10-CM | POA: Diagnosis not present

## 2017-07-05 DIAGNOSIS — H903 Sensorineural hearing loss, bilateral: Secondary | ICD-10-CM

## 2017-07-05 HISTORY — DX: Sensorineural hearing loss, bilateral: H90.3

## 2017-07-12 DIAGNOSIS — Z720 Tobacco use: Secondary | ICD-10-CM | POA: Diagnosis not present

## 2017-07-12 DIAGNOSIS — I480 Paroxysmal atrial fibrillation: Secondary | ICD-10-CM | POA: Diagnosis not present

## 2017-07-12 DIAGNOSIS — Z952 Presence of prosthetic heart valve: Secondary | ICD-10-CM | POA: Diagnosis not present

## 2017-07-24 DIAGNOSIS — M810 Age-related osteoporosis without current pathological fracture: Secondary | ICD-10-CM | POA: Diagnosis not present

## 2017-07-24 DIAGNOSIS — I272 Pulmonary hypertension, unspecified: Secondary | ICD-10-CM | POA: Diagnosis not present

## 2017-07-24 DIAGNOSIS — G459 Transient cerebral ischemic attack, unspecified: Secondary | ICD-10-CM | POA: Diagnosis not present

## 2017-07-24 DIAGNOSIS — Z79899 Other long term (current) drug therapy: Secondary | ICD-10-CM | POA: Diagnosis not present

## 2017-07-24 DIAGNOSIS — R29818 Other symptoms and signs involving the nervous system: Secondary | ICD-10-CM | POA: Diagnosis not present

## 2017-07-24 DIAGNOSIS — Z7901 Long term (current) use of anticoagulants: Secondary | ICD-10-CM | POA: Diagnosis not present

## 2017-07-24 DIAGNOSIS — I5032 Chronic diastolic (congestive) heart failure: Secondary | ICD-10-CM | POA: Diagnosis not present

## 2017-07-24 DIAGNOSIS — K219 Gastro-esophageal reflux disease without esophagitis: Secondary | ICD-10-CM | POA: Diagnosis not present

## 2017-07-24 DIAGNOSIS — I6789 Other cerebrovascular disease: Secondary | ICD-10-CM | POA: Diagnosis not present

## 2017-07-24 DIAGNOSIS — I639 Cerebral infarction, unspecified: Secondary | ICD-10-CM | POA: Diagnosis not present

## 2017-07-24 DIAGNOSIS — J449 Chronic obstructive pulmonary disease, unspecified: Secondary | ICD-10-CM | POA: Diagnosis not present

## 2017-07-24 DIAGNOSIS — R29810 Facial weakness: Secondary | ICD-10-CM | POA: Diagnosis not present

## 2017-07-24 DIAGNOSIS — I480 Paroxysmal atrial fibrillation: Secondary | ICD-10-CM | POA: Diagnosis not present

## 2017-07-24 DIAGNOSIS — R4781 Slurred speech: Secondary | ICD-10-CM | POA: Diagnosis not present

## 2017-07-24 DIAGNOSIS — R531 Weakness: Secondary | ICD-10-CM | POA: Diagnosis not present

## 2017-07-24 DIAGNOSIS — Z952 Presence of prosthetic heart valve: Secondary | ICD-10-CM | POA: Diagnosis not present

## 2017-07-24 DIAGNOSIS — F17210 Nicotine dependence, cigarettes, uncomplicated: Secondary | ICD-10-CM | POA: Diagnosis not present

## 2017-07-24 HISTORY — DX: Transient cerebral ischemic attack, unspecified: G45.9

## 2017-07-25 DIAGNOSIS — I480 Paroxysmal atrial fibrillation: Secondary | ICD-10-CM | POA: Diagnosis not present

## 2017-07-25 DIAGNOSIS — I5032 Chronic diastolic (congestive) heart failure: Secondary | ICD-10-CM | POA: Diagnosis not present

## 2017-07-25 DIAGNOSIS — I082 Rheumatic disorders of both aortic and tricuspid valves: Secondary | ICD-10-CM | POA: Diagnosis not present

## 2017-07-25 DIAGNOSIS — I272 Pulmonary hypertension, unspecified: Secondary | ICD-10-CM | POA: Diagnosis not present

## 2017-07-25 DIAGNOSIS — J449 Chronic obstructive pulmonary disease, unspecified: Secondary | ICD-10-CM | POA: Diagnosis not present

## 2017-07-26 DIAGNOSIS — I272 Pulmonary hypertension, unspecified: Secondary | ICD-10-CM | POA: Diagnosis not present

## 2017-07-26 DIAGNOSIS — J449 Chronic obstructive pulmonary disease, unspecified: Secondary | ICD-10-CM | POA: Diagnosis not present

## 2017-07-26 DIAGNOSIS — I480 Paroxysmal atrial fibrillation: Secondary | ICD-10-CM | POA: Diagnosis not present

## 2017-07-26 DIAGNOSIS — I5032 Chronic diastolic (congestive) heart failure: Secondary | ICD-10-CM | POA: Diagnosis not present

## 2017-08-04 DIAGNOSIS — Z7901 Long term (current) use of anticoagulants: Secondary | ICD-10-CM | POA: Diagnosis not present

## 2017-08-04 DIAGNOSIS — I480 Paroxysmal atrial fibrillation: Secondary | ICD-10-CM | POA: Diagnosis not present

## 2017-08-31 DIAGNOSIS — Z7901 Long term (current) use of anticoagulants: Secondary | ICD-10-CM | POA: Diagnosis not present

## 2017-08-31 DIAGNOSIS — I480 Paroxysmal atrial fibrillation: Secondary | ICD-10-CM | POA: Diagnosis not present

## 2017-09-18 DIAGNOSIS — I517 Cardiomegaly: Secondary | ICD-10-CM | POA: Diagnosis not present

## 2017-09-18 DIAGNOSIS — I4891 Unspecified atrial fibrillation: Secondary | ICD-10-CM | POA: Diagnosis not present

## 2017-09-18 DIAGNOSIS — M810 Age-related osteoporosis without current pathological fracture: Secondary | ICD-10-CM | POA: Diagnosis not present

## 2017-09-18 DIAGNOSIS — Z7901 Long term (current) use of anticoagulants: Secondary | ICD-10-CM | POA: Diagnosis not present

## 2017-09-18 DIAGNOSIS — F319 Bipolar disorder, unspecified: Secondary | ICD-10-CM | POA: Diagnosis not present

## 2017-09-18 DIAGNOSIS — J449 Chronic obstructive pulmonary disease, unspecified: Secondary | ICD-10-CM | POA: Diagnosis not present

## 2017-09-18 DIAGNOSIS — Z7982 Long term (current) use of aspirin: Secondary | ICD-10-CM | POA: Diagnosis not present

## 2017-09-18 DIAGNOSIS — Z888 Allergy status to other drugs, medicaments and biological substances status: Secondary | ICD-10-CM | POA: Diagnosis not present

## 2017-09-18 DIAGNOSIS — Z7951 Long term (current) use of inhaled steroids: Secondary | ICD-10-CM | POA: Diagnosis not present

## 2017-09-18 DIAGNOSIS — Z79899 Other long term (current) drug therapy: Secondary | ICD-10-CM | POA: Diagnosis not present

## 2017-09-18 DIAGNOSIS — I509 Heart failure, unspecified: Secondary | ICD-10-CM | POA: Diagnosis not present

## 2017-09-18 DIAGNOSIS — R079 Chest pain, unspecified: Secondary | ICD-10-CM | POA: Diagnosis not present

## 2017-09-18 DIAGNOSIS — G319 Degenerative disease of nervous system, unspecified: Secondary | ICD-10-CM | POA: Diagnosis not present

## 2017-09-18 DIAGNOSIS — F17210 Nicotine dependence, cigarettes, uncomplicated: Secondary | ICD-10-CM | POA: Diagnosis not present

## 2017-09-18 DIAGNOSIS — F419 Anxiety disorder, unspecified: Secondary | ICD-10-CM | POA: Diagnosis not present

## 2017-09-26 DIAGNOSIS — R0602 Shortness of breath: Secondary | ICD-10-CM | POA: Diagnosis not present

## 2017-09-26 DIAGNOSIS — M79641 Pain in right hand: Secondary | ICD-10-CM | POA: Diagnosis not present

## 2017-09-26 DIAGNOSIS — M722 Plantar fascial fibromatosis: Secondary | ICD-10-CM | POA: Diagnosis not present

## 2017-09-26 DIAGNOSIS — G8929 Other chronic pain: Secondary | ICD-10-CM | POA: Diagnosis not present

## 2017-09-26 DIAGNOSIS — M79642 Pain in left hand: Secondary | ICD-10-CM | POA: Diagnosis not present

## 2017-09-26 DIAGNOSIS — M545 Low back pain: Secondary | ICD-10-CM | POA: Diagnosis not present

## 2017-09-26 DIAGNOSIS — J441 Chronic obstructive pulmonary disease with (acute) exacerbation: Secondary | ICD-10-CM | POA: Diagnosis not present

## 2017-09-28 DIAGNOSIS — I480 Paroxysmal atrial fibrillation: Secondary | ICD-10-CM | POA: Diagnosis not present

## 2017-09-28 DIAGNOSIS — Z7901 Long term (current) use of anticoagulants: Secondary | ICD-10-CM | POA: Diagnosis not present

## 2017-10-04 DIAGNOSIS — N6001 Solitary cyst of right breast: Secondary | ICD-10-CM | POA: Diagnosis not present

## 2017-10-04 DIAGNOSIS — M545 Low back pain: Secondary | ICD-10-CM | POA: Diagnosis not present

## 2017-10-04 DIAGNOSIS — M7552 Bursitis of left shoulder: Secondary | ICD-10-CM | POA: Diagnosis not present

## 2017-10-04 DIAGNOSIS — M546 Pain in thoracic spine: Secondary | ICD-10-CM | POA: Diagnosis not present

## 2017-10-04 DIAGNOSIS — M722 Plantar fascial fibromatosis: Secondary | ICD-10-CM | POA: Diagnosis not present

## 2017-10-04 DIAGNOSIS — G5603 Carpal tunnel syndrome, bilateral upper limbs: Secondary | ICD-10-CM | POA: Diagnosis not present

## 2017-10-04 DIAGNOSIS — F17210 Nicotine dependence, cigarettes, uncomplicated: Secondary | ICD-10-CM | POA: Diagnosis not present

## 2017-10-04 DIAGNOSIS — W19XXXA Unspecified fall, initial encounter: Secondary | ICD-10-CM | POA: Diagnosis not present

## 2017-10-04 DIAGNOSIS — G8929 Other chronic pain: Secondary | ICD-10-CM | POA: Diagnosis not present

## 2017-10-04 DIAGNOSIS — E669 Obesity, unspecified: Secondary | ICD-10-CM | POA: Diagnosis not present

## 2017-10-06 DIAGNOSIS — M79642 Pain in left hand: Secondary | ICD-10-CM | POA: Diagnosis not present

## 2017-10-06 DIAGNOSIS — M79641 Pain in right hand: Secondary | ICD-10-CM | POA: Diagnosis not present

## 2017-10-06 DIAGNOSIS — G5603 Carpal tunnel syndrome, bilateral upper limbs: Secondary | ICD-10-CM | POA: Diagnosis not present

## 2017-10-06 DIAGNOSIS — M25532 Pain in left wrist: Secondary | ICD-10-CM | POA: Diagnosis not present

## 2017-10-06 DIAGNOSIS — M25531 Pain in right wrist: Secondary | ICD-10-CM | POA: Diagnosis not present

## 2017-10-06 DIAGNOSIS — M546 Pain in thoracic spine: Secondary | ICD-10-CM | POA: Diagnosis not present

## 2017-10-27 DIAGNOSIS — R10814 Left lower quadrant abdominal tenderness: Secondary | ICD-10-CM | POA: Diagnosis not present

## 2017-10-27 DIAGNOSIS — Z9889 Other specified postprocedural states: Secondary | ICD-10-CM | POA: Diagnosis not present

## 2017-10-27 DIAGNOSIS — F17210 Nicotine dependence, cigarettes, uncomplicated: Secondary | ICD-10-CM | POA: Diagnosis not present

## 2017-10-27 DIAGNOSIS — Z91048 Other nonmedicinal substance allergy status: Secondary | ICD-10-CM | POA: Diagnosis not present

## 2017-10-27 DIAGNOSIS — Z90721 Acquired absence of ovaries, unilateral: Secondary | ICD-10-CM | POA: Diagnosis not present

## 2017-10-27 DIAGNOSIS — Z888 Allergy status to other drugs, medicaments and biological substances status: Secondary | ICD-10-CM | POA: Diagnosis not present

## 2017-10-27 DIAGNOSIS — Z79899 Other long term (current) drug therapy: Secondary | ICD-10-CM | POA: Diagnosis not present

## 2017-10-27 DIAGNOSIS — F419 Anxiety disorder, unspecified: Secondary | ICD-10-CM | POA: Diagnosis not present

## 2017-10-27 DIAGNOSIS — Z90710 Acquired absence of both cervix and uterus: Secondary | ICD-10-CM | POA: Diagnosis not present

## 2017-10-27 DIAGNOSIS — R109 Unspecified abdominal pain: Secondary | ICD-10-CM | POA: Diagnosis not present

## 2017-10-27 DIAGNOSIS — R1012 Left upper quadrant pain: Secondary | ICD-10-CM | POA: Diagnosis not present

## 2017-10-27 DIAGNOSIS — I4891 Unspecified atrial fibrillation: Secondary | ICD-10-CM | POA: Diagnosis not present

## 2017-10-27 DIAGNOSIS — Z7982 Long term (current) use of aspirin: Secondary | ICD-10-CM | POA: Diagnosis not present

## 2017-10-27 DIAGNOSIS — R1084 Generalized abdominal pain: Secondary | ICD-10-CM | POA: Diagnosis not present

## 2017-10-27 DIAGNOSIS — R52 Pain, unspecified: Secondary | ICD-10-CM | POA: Diagnosis not present

## 2017-10-27 DIAGNOSIS — M810 Age-related osteoporosis without current pathological fracture: Secondary | ICD-10-CM | POA: Diagnosis not present

## 2017-10-27 DIAGNOSIS — Z7951 Long term (current) use of inhaled steroids: Secondary | ICD-10-CM | POA: Diagnosis not present

## 2017-10-27 DIAGNOSIS — I509 Heart failure, unspecified: Secondary | ICD-10-CM | POA: Diagnosis not present

## 2017-10-27 DIAGNOSIS — F319 Bipolar disorder, unspecified: Secondary | ICD-10-CM | POA: Diagnosis not present

## 2017-10-27 DIAGNOSIS — Z7901 Long term (current) use of anticoagulants: Secondary | ICD-10-CM | POA: Diagnosis not present

## 2017-10-27 DIAGNOSIS — J449 Chronic obstructive pulmonary disease, unspecified: Secondary | ICD-10-CM | POA: Diagnosis not present

## 2017-10-27 DIAGNOSIS — R0689 Other abnormalities of breathing: Secondary | ICD-10-CM | POA: Diagnosis not present

## 2017-10-27 DIAGNOSIS — K2970 Gastritis, unspecified, without bleeding: Secondary | ICD-10-CM | POA: Diagnosis not present

## 2017-10-30 DIAGNOSIS — I480 Paroxysmal atrial fibrillation: Secondary | ICD-10-CM | POA: Diagnosis not present

## 2017-10-30 DIAGNOSIS — R109 Unspecified abdominal pain: Secondary | ICD-10-CM | POA: Diagnosis not present

## 2017-10-30 DIAGNOSIS — Z7901 Long term (current) use of anticoagulants: Secondary | ICD-10-CM | POA: Diagnosis not present

## 2017-10-30 DIAGNOSIS — F419 Anxiety disorder, unspecified: Secondary | ICD-10-CM | POA: Diagnosis not present

## 2017-10-30 DIAGNOSIS — K219 Gastro-esophageal reflux disease without esophagitis: Secondary | ICD-10-CM | POA: Diagnosis not present

## 2017-11-06 DIAGNOSIS — F319 Bipolar disorder, unspecified: Secondary | ICD-10-CM | POA: Diagnosis not present

## 2017-11-06 DIAGNOSIS — Z7951 Long term (current) use of inhaled steroids: Secondary | ICD-10-CM | POA: Diagnosis not present

## 2017-11-06 DIAGNOSIS — Z91048 Other nonmedicinal substance allergy status: Secondary | ICD-10-CM | POA: Diagnosis not present

## 2017-11-06 DIAGNOSIS — Z952 Presence of prosthetic heart valve: Secondary | ICD-10-CM | POA: Diagnosis not present

## 2017-11-06 DIAGNOSIS — Z79899 Other long term (current) drug therapy: Secondary | ICD-10-CM | POA: Diagnosis not present

## 2017-11-06 DIAGNOSIS — M810 Age-related osteoporosis without current pathological fracture: Secondary | ICD-10-CM | POA: Diagnosis not present

## 2017-11-06 DIAGNOSIS — J449 Chronic obstructive pulmonary disease, unspecified: Secondary | ICD-10-CM | POA: Diagnosis not present

## 2017-11-06 DIAGNOSIS — F17210 Nicotine dependence, cigarettes, uncomplicated: Secondary | ICD-10-CM | POA: Diagnosis not present

## 2017-11-06 DIAGNOSIS — M419 Scoliosis, unspecified: Secondary | ICD-10-CM | POA: Diagnosis not present

## 2017-11-06 DIAGNOSIS — I509 Heart failure, unspecified: Secondary | ICD-10-CM | POA: Diagnosis not present

## 2017-11-06 DIAGNOSIS — K219 Gastro-esophageal reflux disease without esophagitis: Secondary | ICD-10-CM | POA: Diagnosis not present

## 2017-11-06 DIAGNOSIS — F419 Anxiety disorder, unspecified: Secondary | ICD-10-CM | POA: Diagnosis not present

## 2017-11-06 DIAGNOSIS — R079 Chest pain, unspecified: Secondary | ICD-10-CM | POA: Diagnosis not present

## 2017-11-06 DIAGNOSIS — Z888 Allergy status to other drugs, medicaments and biological substances status: Secondary | ICD-10-CM | POA: Diagnosis not present

## 2017-11-06 DIAGNOSIS — Z7901 Long term (current) use of anticoagulants: Secondary | ICD-10-CM | POA: Diagnosis not present

## 2017-11-06 DIAGNOSIS — M5030 Other cervical disc degeneration, unspecified cervical region: Secondary | ICD-10-CM | POA: Diagnosis not present

## 2017-11-06 DIAGNOSIS — R0789 Other chest pain: Secondary | ICD-10-CM | POA: Diagnosis not present

## 2017-11-07 DIAGNOSIS — J449 Chronic obstructive pulmonary disease, unspecified: Secondary | ICD-10-CM | POA: Diagnosis not present

## 2017-11-08 DIAGNOSIS — K5909 Other constipation: Secondary | ICD-10-CM | POA: Diagnosis not present

## 2017-11-08 DIAGNOSIS — R634 Abnormal weight loss: Secondary | ICD-10-CM | POA: Diagnosis not present

## 2017-11-08 DIAGNOSIS — R1012 Left upper quadrant pain: Secondary | ICD-10-CM | POA: Diagnosis not present

## 2017-11-08 DIAGNOSIS — K219 Gastro-esophageal reflux disease without esophagitis: Secondary | ICD-10-CM | POA: Diagnosis not present

## 2017-11-08 DIAGNOSIS — Z1211 Encounter for screening for malignant neoplasm of colon: Secondary | ICD-10-CM | POA: Diagnosis not present

## 2017-11-08 DIAGNOSIS — Z7901 Long term (current) use of anticoagulants: Secondary | ICD-10-CM | POA: Diagnosis not present

## 2017-11-09 DIAGNOSIS — J449 Chronic obstructive pulmonary disease, unspecified: Secondary | ICD-10-CM | POA: Diagnosis not present

## 2017-11-15 DIAGNOSIS — N6001 Solitary cyst of right breast: Secondary | ICD-10-CM | POA: Diagnosis not present

## 2017-11-15 DIAGNOSIS — R922 Inconclusive mammogram: Secondary | ICD-10-CM | POA: Diagnosis not present

## 2017-11-27 DIAGNOSIS — Z7901 Long term (current) use of anticoagulants: Secondary | ICD-10-CM | POA: Diagnosis not present

## 2017-11-27 DIAGNOSIS — I480 Paroxysmal atrial fibrillation: Secondary | ICD-10-CM | POA: Diagnosis not present

## 2017-12-26 DIAGNOSIS — Z7901 Long term (current) use of anticoagulants: Secondary | ICD-10-CM | POA: Diagnosis not present

## 2017-12-26 DIAGNOSIS — I480 Paroxysmal atrial fibrillation: Secondary | ICD-10-CM | POA: Diagnosis not present

## 2018-01-23 DIAGNOSIS — I480 Paroxysmal atrial fibrillation: Secondary | ICD-10-CM | POA: Diagnosis not present

## 2018-01-23 DIAGNOSIS — Z7901 Long term (current) use of anticoagulants: Secondary | ICD-10-CM | POA: Diagnosis not present

## 2018-01-25 DIAGNOSIS — Z1211 Encounter for screening for malignant neoplasm of colon: Secondary | ICD-10-CM | POA: Diagnosis not present

## 2018-01-25 DIAGNOSIS — G5601 Carpal tunnel syndrome, right upper limb: Secondary | ICD-10-CM | POA: Diagnosis not present

## 2018-01-25 DIAGNOSIS — J302 Other seasonal allergic rhinitis: Secondary | ICD-10-CM | POA: Diagnosis not present

## 2018-01-25 DIAGNOSIS — I482 Chronic atrial fibrillation: Secondary | ICD-10-CM | POA: Diagnosis not present

## 2018-01-25 DIAGNOSIS — F319 Bipolar disorder, unspecified: Secondary | ICD-10-CM | POA: Diagnosis not present

## 2018-01-25 DIAGNOSIS — Z23 Encounter for immunization: Secondary | ICD-10-CM | POA: Diagnosis not present

## 2018-01-25 DIAGNOSIS — K210 Gastro-esophageal reflux disease with esophagitis: Secondary | ICD-10-CM | POA: Diagnosis not present

## 2018-01-25 DIAGNOSIS — F17200 Nicotine dependence, unspecified, uncomplicated: Secondary | ICD-10-CM | POA: Diagnosis not present

## 2018-01-30 DIAGNOSIS — J441 Chronic obstructive pulmonary disease with (acute) exacerbation: Secondary | ICD-10-CM | POA: Diagnosis not present

## 2018-02-06 DIAGNOSIS — Z1211 Encounter for screening for malignant neoplasm of colon: Secondary | ICD-10-CM | POA: Diagnosis not present

## 2018-02-20 DIAGNOSIS — Z7901 Long term (current) use of anticoagulants: Secondary | ICD-10-CM | POA: Diagnosis not present

## 2018-02-20 DIAGNOSIS — I480 Paroxysmal atrial fibrillation: Secondary | ICD-10-CM | POA: Diagnosis not present

## 2018-02-21 DIAGNOSIS — R195 Other fecal abnormalities: Secondary | ICD-10-CM | POA: Diagnosis not present

## 2018-02-21 DIAGNOSIS — K625 Hemorrhage of anus and rectum: Secondary | ICD-10-CM | POA: Diagnosis not present

## 2018-03-02 DIAGNOSIS — F319 Bipolar disorder, unspecified: Secondary | ICD-10-CM | POA: Diagnosis not present

## 2018-03-02 DIAGNOSIS — K625 Hemorrhage of anus and rectum: Secondary | ICD-10-CM | POA: Diagnosis not present

## 2018-03-02 DIAGNOSIS — I110 Hypertensive heart disease with heart failure: Secondary | ICD-10-CM | POA: Diagnosis not present

## 2018-03-02 DIAGNOSIS — K635 Polyp of colon: Secondary | ICD-10-CM | POA: Diagnosis not present

## 2018-03-02 DIAGNOSIS — E785 Hyperlipidemia, unspecified: Secondary | ICD-10-CM | POA: Diagnosis not present

## 2018-03-02 DIAGNOSIS — Z800 Family history of malignant neoplasm of digestive organs: Secondary | ICD-10-CM | POA: Diagnosis not present

## 2018-03-02 DIAGNOSIS — I4891 Unspecified atrial fibrillation: Secondary | ICD-10-CM | POA: Diagnosis not present

## 2018-03-02 DIAGNOSIS — Z90710 Acquired absence of both cervix and uterus: Secondary | ICD-10-CM | POA: Diagnosis not present

## 2018-03-02 DIAGNOSIS — Z7901 Long term (current) use of anticoagulants: Secondary | ICD-10-CM | POA: Diagnosis not present

## 2018-03-02 DIAGNOSIS — Z888 Allergy status to other drugs, medicaments and biological substances status: Secondary | ICD-10-CM | POA: Diagnosis not present

## 2018-03-02 DIAGNOSIS — R195 Other fecal abnormalities: Secondary | ICD-10-CM | POA: Diagnosis not present

## 2018-03-02 DIAGNOSIS — F411 Generalized anxiety disorder: Secondary | ICD-10-CM | POA: Diagnosis not present

## 2018-03-02 DIAGNOSIS — K219 Gastro-esophageal reflux disease without esophagitis: Secondary | ICD-10-CM | POA: Diagnosis not present

## 2018-03-02 DIAGNOSIS — D125 Benign neoplasm of sigmoid colon: Secondary | ICD-10-CM | POA: Diagnosis not present

## 2018-03-02 DIAGNOSIS — D124 Benign neoplasm of descending colon: Secondary | ICD-10-CM | POA: Diagnosis not present

## 2018-03-02 DIAGNOSIS — Z79899 Other long term (current) drug therapy: Secondary | ICD-10-CM | POA: Diagnosis not present

## 2018-03-02 DIAGNOSIS — M5137 Other intervertebral disc degeneration, lumbosacral region: Secondary | ICD-10-CM | POA: Diagnosis not present

## 2018-03-02 DIAGNOSIS — Z952 Presence of prosthetic heart valve: Secondary | ICD-10-CM | POA: Diagnosis not present

## 2018-03-02 DIAGNOSIS — I509 Heart failure, unspecified: Secondary | ICD-10-CM | POA: Diagnosis not present

## 2018-03-02 DIAGNOSIS — J449 Chronic obstructive pulmonary disease, unspecified: Secondary | ICD-10-CM | POA: Diagnosis not present

## 2018-03-02 DIAGNOSIS — Z8673 Personal history of transient ischemic attack (TIA), and cerebral infarction without residual deficits: Secondary | ICD-10-CM | POA: Diagnosis not present

## 2018-03-02 DIAGNOSIS — Z7951 Long term (current) use of inhaled steroids: Secondary | ICD-10-CM | POA: Diagnosis not present

## 2018-03-07 DIAGNOSIS — K635 Polyp of colon: Secondary | ICD-10-CM | POA: Diagnosis not present

## 2018-03-09 DIAGNOSIS — R110 Nausea: Secondary | ICD-10-CM | POA: Diagnosis not present

## 2018-03-09 DIAGNOSIS — R195 Other fecal abnormalities: Secondary | ICD-10-CM | POA: Diagnosis not present

## 2018-03-09 DIAGNOSIS — M549 Dorsalgia, unspecified: Secondary | ICD-10-CM | POA: Diagnosis not present

## 2018-03-09 DIAGNOSIS — R1012 Left upper quadrant pain: Secondary | ICD-10-CM | POA: Diagnosis not present

## 2018-03-13 ENCOUNTER — Encounter: Payer: Medicare Other | Admitting: Neurology

## 2018-03-13 ENCOUNTER — Telehealth: Payer: Self-pay | Admitting: Neurology

## 2018-03-13 DIAGNOSIS — Z7901 Long term (current) use of anticoagulants: Secondary | ICD-10-CM | POA: Diagnosis not present

## 2018-03-13 DIAGNOSIS — K0889 Other specified disorders of teeth and supporting structures: Secondary | ICD-10-CM | POA: Diagnosis not present

## 2018-03-13 DIAGNOSIS — Z79899 Other long term (current) drug therapy: Secondary | ICD-10-CM | POA: Diagnosis not present

## 2018-03-13 DIAGNOSIS — J449 Chronic obstructive pulmonary disease, unspecified: Secondary | ICD-10-CM | POA: Diagnosis not present

## 2018-03-13 DIAGNOSIS — K029 Dental caries, unspecified: Secondary | ICD-10-CM | POA: Diagnosis not present

## 2018-03-13 DIAGNOSIS — I4891 Unspecified atrial fibrillation: Secondary | ICD-10-CM | POA: Diagnosis not present

## 2018-03-13 DIAGNOSIS — F319 Bipolar disorder, unspecified: Secondary | ICD-10-CM | POA: Diagnosis not present

## 2018-03-13 DIAGNOSIS — F17200 Nicotine dependence, unspecified, uncomplicated: Secondary | ICD-10-CM | POA: Diagnosis not present

## 2018-03-13 DIAGNOSIS — Z7951 Long term (current) use of inhaled steroids: Secondary | ICD-10-CM | POA: Diagnosis not present

## 2018-03-13 DIAGNOSIS — K219 Gastro-esophageal reflux disease without esophagitis: Secondary | ICD-10-CM | POA: Diagnosis not present

## 2018-03-13 DIAGNOSIS — Z7982 Long term (current) use of aspirin: Secondary | ICD-10-CM | POA: Diagnosis not present

## 2018-03-13 NOTE — Telephone Encounter (Signed)
No showed for appointment, this patient did not show for a EMG and nerve conduction study today.

## 2018-03-14 ENCOUNTER — Encounter: Payer: Self-pay | Admitting: Neurology

## 2018-03-14 ENCOUNTER — Emergency Department (HOSPITAL_COMMUNITY)
Admission: EM | Admit: 2018-03-14 | Discharge: 2018-03-14 | Disposition: A | Discharge status: Home / Self Care | Payer: Medicare Other | Attending: Emergency Medicine | Admitting: Emergency Medicine

## 2018-03-14 ENCOUNTER — Encounter (HOSPITAL_COMMUNITY): Payer: Self-pay | Admitting: Emergency Medicine

## 2018-03-14 ENCOUNTER — Other Ambulatory Visit: Payer: Self-pay

## 2018-03-14 DIAGNOSIS — Z8673 Personal history of transient ischemic attack (TIA), and cerebral infarction without residual deficits: Secondary | ICD-10-CM | POA: Diagnosis not present

## 2018-03-14 DIAGNOSIS — K0889 Other specified disorders of teeth and supporting structures: Secondary | ICD-10-CM | POA: Diagnosis present

## 2018-03-14 DIAGNOSIS — J449 Chronic obstructive pulmonary disease, unspecified: Secondary | ICD-10-CM | POA: Insufficient documentation

## 2018-03-14 DIAGNOSIS — Z7901 Long term (current) use of anticoagulants: Secondary | ICD-10-CM | POA: Insufficient documentation

## 2018-03-14 DIAGNOSIS — I4891 Unspecified atrial fibrillation: Secondary | ICD-10-CM | POA: Insufficient documentation

## 2018-03-14 DIAGNOSIS — K047 Periapical abscess without sinus: Secondary | ICD-10-CM | POA: Diagnosis not present

## 2018-03-14 DIAGNOSIS — F419 Anxiety disorder, unspecified: Secondary | ICD-10-CM | POA: Diagnosis not present

## 2018-03-14 DIAGNOSIS — F17210 Nicotine dependence, cigarettes, uncomplicated: Secondary | ICD-10-CM | POA: Insufficient documentation

## 2018-03-14 DIAGNOSIS — Z7982 Long term (current) use of aspirin: Secondary | ICD-10-CM | POA: Diagnosis not present

## 2018-03-14 DIAGNOSIS — F319 Bipolar disorder, unspecified: Secondary | ICD-10-CM | POA: Diagnosis not present

## 2018-03-14 DIAGNOSIS — K219 Gastro-esophageal reflux disease without esophagitis: Secondary | ICD-10-CM | POA: Diagnosis not present

## 2018-03-14 DIAGNOSIS — F17200 Nicotine dependence, unspecified, uncomplicated: Secondary | ICD-10-CM | POA: Diagnosis not present

## 2018-03-14 DIAGNOSIS — Z79899 Other long term (current) drug therapy: Secondary | ICD-10-CM | POA: Diagnosis not present

## 2018-03-14 HISTORY — DX: Bipolar disorder, unspecified: F31.9

## 2018-03-14 HISTORY — DX: Unspecified osteoarthritis, unspecified site: M19.90

## 2018-03-14 HISTORY — DX: Post-traumatic stress disorder, unspecified: F43.10

## 2018-03-14 HISTORY — DX: Chronic obstructive pulmonary disease, unspecified: J44.9

## 2018-03-14 MED ORDER — LIDOCAINE VISCOUS HCL 2 % MT SOLN: 15.0000 mL | OROMUCOSAL | 0 refills | Status: DC | PRN

## 2018-03-14 MED ORDER — BUPIVACAINE-EPINEPHRINE (PF) 0.5% -1:200000 IJ SOLN
1.8000 mL | Freq: Once | INTRAMUSCULAR | Status: AC
  Administered 2018-03-14: 1.8 mL
  Filled 2018-03-14: qty 1.8

## 2018-03-14 NOTE — Discharge Instructions (Addendum)
Thank you for allowing me to care for you today in the Emergency Department.   Take 1 tablet of amoxicillin from the prescription that you were given at [facility] rocking him every 8 hours for the next week.  Do not stop taking this medication even if your symptoms improve.  You can use 50 mL of viscous lidocaine every 3 hours to help with your dental pain.  Take 650 mg of Tylenol or 600 mg of ibuprofen with food every 6 hours for pain control.  You can also alternate between these 2 medications every 3 hours.  You can apply cool compress to the right side of the face, but avoid applying heat to the right side of the face because this can cause worsening swelling and symptoms.  Please use the attached dental resource guide to schedule a follow-up with a dentist.  I would advise calling this afternoon as most dental offices are closed on Friday.  Also, Dr. Gwenlyn Saran office is listed above.  Return to the emergency department if you develop significantly worsening symptoms including feeling as if you are gasping for air, a high fever, inability to open your mouth, drooling, or other new, concerning symptoms.

## 2018-03-14 NOTE — ED Provider Notes (Signed)
[facility] EMERGENCY DEPARTMENT Provider Note   CSN: [id number] Arrival date & time: 03/14/18  1203     History   Chief Complaint Chief Complaint  Patient presents with  . Dental Pain    HPI Haley Patton is a 52 y.o. female with a history of mitral valve replacement, A. fib on Coumadin, who presents to the emergency department with a chief complaint of dental pain and right-sided facial swelling.  The patient reports right lower dental pain that began yesterday.  She was seen at [facility] last night and was treated with Norco and started on a course of amoxicillin.  She reports that when she awoke today she had some swelling to the right side of her face with worsening dental pain.  She denies fever, chills, chest pain, dyspnea, trismus, dysphagia, muffled voice, neck swelling.  Reports that she was sent home with 1 tablet of Norco, which she took this morning, but reports her pain has worsened and is constant.  She reports that the tooth that is causing her pain was fractured sometime ago.  She is not currently established with a dentist.  The history is provided by the patient. No language interpreter was used.    Past Medical History:  Diagnosis Date  . Bipolar 1 disorder ([place])   . COPD (chronic obstructive pulmonary disease) ([place])   . DJD (degenerative joint disease)   . PTSD (post-traumatic stress disorder)     There are no active problems to display for this patient.   Past Surgical History:  Procedure Laterality Date  . ABDOMINAL HYSTERECTOMY    . CERVICAL SPINE SURGERY    . VALVE REPLACEMENT       OB History   None      Home Medications    Prior to Admission medications   Medication Sig Start Date End Date Taking? Authorizing Provider  lidocaine (XYLOCAINE) 2 % solution Use as directed 15 mLs in the mouth or throat every 3 (three) hours as needed for mouth pain. 03/14/18   Daffney Greenly, Laymond Purser, PA-C    Family History History reviewed. No  pertinent family history.  Social History Social History   Tobacco Use  . Smoking status: Current Every Day Smoker    Packs/day: 1.50    Types: Cigarettes  . Smokeless tobacco: Never Used  Substance Use Topics  . Alcohol use: Never    Frequency: Never  . Drug use: Never     Allergies   Lithium   Review of Systems Review of Systems  Constitutional: Negative for activity change, chills and fever.  HENT: Positive for dental problem and facial swelling. Negative for sinus pressure, sinus pain, sore throat and trouble swallowing.   Respiratory: Negative for shortness of breath.   Cardiovascular: Negative for chest pain.  Gastrointestinal: Negative for abdominal pain.  Musculoskeletal: Negative for back pain.  Skin: Negative for rash.   Physical Exam Updated Vital Signs BP (!) 148/74 (BP Location: Right Arm)   Pulse 79   Temp 99.3 F (37.4 C) (Oral)   Resp 18   Ht 5\' 4"  (1.626 m)   Wt 97.1 kg   SpO2 94%   BMI 36.73 kg/m   Physical Exam  Constitutional: No distress.  HENT:  Head: Normocephalic.  Poor dentition. Tooth # 29 is fractured.  Mild induration noted to the corresponding gingiva with mild fluctuance, consistent with a dental abscess.  No active or drainage able to be expressed.  Mild swelling noted to the right body  of the mandible.  Eyes: Conjunctivae are normal.  Neck: Neck supple.  Cardiovascular: Normal rate and regular rhythm. Exam reveals no gallop and no friction rub.  No murmur heard. Pulmonary/Chest: Effort normal. No respiratory distress.  Abdominal: Soft. She exhibits no distension.  Neurological: She is alert.  Skin: Skin is warm. No rash noted.  Psychiatric: Her behavior is normal.  Nursing note and vitals reviewed.  ED Treatments / Results  Labs (all labs ordered are listed, but only abnormal results are displayed) Labs Reviewed - No data to display  EKG None  Radiology No results found.  Procedures Dental Block Date/Time:  03/14/2018 3:05 PM Performed by: Joanne Gavel, PA-C Authorized by: Joanne Gavel, PA-C   Consent:    Consent obtained:  Verbal   Consent given by:  Patient   Risks discussed:  Hematoma, intravascular injection, pain, swelling, nerve damage and unsuccessful block   Alternatives discussed:  No treatment Indications:    Indications: dental abscess   Location:    Block type:  Inferior alveolar   Laterality:  Right Procedure details (see MAR for exact dosages):    Needle gauge:  27 G   Anesthetic injected:  Bupivacaine 0.5% WITH epi   Injection procedure:  Anatomic landmarks identified, introduced needle, incremental injection, negative aspiration for blood and anatomic landmarks palpated Post-procedure details:    Outcome:  Anesthesia achieved   Patient tolerance of procedure:  Tolerated well, no immediate complications   (including critical care time)  Medications Ordered in ED Medications  bupivacaine-epinephrine (MARCAINE W/ EPI) 0.5% -1:200000 injection 1.8 mL (1.8 mLs Infiltration Given by Other 03/14/18 1420)     Initial Impression / Assessment and Plan / ED Course  I have reviewed the triage vital signs and the nursing notes.  Pertinent labs & imaging results that were available during my care of the patient were reviewed by me and considered in my medical decision making (see chart for details).     52 year old female with a history of mitral valve replacement, A. fib on Coumadin presenting with a right mandibular abscess.  She was started on antibiotics at another ED visit last night.  She is here for worsening pain.  On exam, she has worsening induration and a developing dental abscess, but it is not amenable to I&D at this time.  Discussed the patient with Dr. Roderic Palau, attending physician.  Dental block performed in the ED with resolution of her pain.  Will discharge with viscous lidocaine for home use and recommended anti-inflammatories.  She has been urged to  follow-up with a dentist.  Doubt Ludwig angina at this time.  She is hemodynamically stable and in no acute distress.  She is safe for discharge home with outpatient follow-up.  Final Clinical Impressions(s) / ED Diagnoses   Final diagnoses:  Dental abscess    ED Discharge Orders         Ordered    lidocaine (XYLOCAINE) 2 % solution  Every  3 hours PRN     03/14/18 1453           Tanor Glaspy A, PA-C 03/14/18 1511    Milton Ferguson, MD 03/14/18 [phone]

## 2018-03-14 NOTE — ED Triage Notes (Signed)
PT c/o right lower dental pain x2 days. PT states she was at [facility] yesterday and the pain hasn't been relieved by the 2 doses of amoxicillin taken. PT requesting pain medication today.

## 2018-03-27 DIAGNOSIS — I480 Paroxysmal atrial fibrillation: Secondary | ICD-10-CM | POA: Diagnosis not present

## 2018-03-27 DIAGNOSIS — Z7901 Long term (current) use of anticoagulants: Secondary | ICD-10-CM | POA: Diagnosis not present

## 2018-04-16 ENCOUNTER — Other Ambulatory Visit (HOSPITAL_COMMUNITY): Payer: Self-pay | Admitting: Internal Medicine

## 2018-04-16 DIAGNOSIS — I10 Essential (primary) hypertension: Secondary | ICD-10-CM | POA: Diagnosis not present

## 2018-04-16 DIAGNOSIS — R1084 Generalized abdominal pain: Secondary | ICD-10-CM

## 2018-04-16 DIAGNOSIS — J449 Chronic obstructive pulmonary disease, unspecified: Secondary | ICD-10-CM | POA: Diagnosis not present

## 2018-04-16 DIAGNOSIS — F17210 Nicotine dependence, cigarettes, uncomplicated: Secondary | ICD-10-CM | POA: Diagnosis not present

## 2018-04-16 DIAGNOSIS — I509 Heart failure, unspecified: Secondary | ICD-10-CM | POA: Diagnosis not present

## 2018-04-16 DIAGNOSIS — F319 Bipolar disorder, unspecified: Secondary | ICD-10-CM | POA: Diagnosis not present

## 2018-04-16 DIAGNOSIS — I482 Chronic atrial fibrillation, unspecified: Secondary | ICD-10-CM | POA: Diagnosis not present

## 2018-04-19 ENCOUNTER — Other Ambulatory Visit (HOSPITAL_COMMUNITY): Payer: Self-pay | Admitting: Internal Medicine

## 2018-04-19 ENCOUNTER — Ambulatory Visit (HOSPITAL_COMMUNITY)
Admission: RE | Admit: 2018-04-19 | Discharge: 2018-04-19 | Disposition: A | Discharge status: Home / Self Care | Payer: Medicare Other | Source: Ambulatory Visit | Attending: Internal Medicine | Admitting: Internal Medicine

## 2018-04-19 DIAGNOSIS — M549 Dorsalgia, unspecified: Secondary | ICD-10-CM | POA: Insufficient documentation

## 2018-04-19 DIAGNOSIS — M47816 Spondylosis without myelopathy or radiculopathy, lumbar region: Secondary | ICD-10-CM | POA: Diagnosis not present

## 2018-04-19 DIAGNOSIS — R109 Unspecified abdominal pain: Secondary | ICD-10-CM | POA: Diagnosis not present

## 2018-04-19 DIAGNOSIS — R932 Abnormal findings on diagnostic imaging of liver and biliary tract: Secondary | ICD-10-CM | POA: Insufficient documentation

## 2018-04-19 DIAGNOSIS — Z952 Presence of prosthetic heart valve: Secondary | ICD-10-CM | POA: Diagnosis not present

## 2018-04-19 DIAGNOSIS — I700 Atherosclerosis of aorta: Secondary | ICD-10-CM | POA: Diagnosis not present

## 2018-04-19 DIAGNOSIS — M47896 Other spondylosis, lumbar region: Secondary | ICD-10-CM | POA: Insufficient documentation

## 2018-04-19 DIAGNOSIS — R1012 Left upper quadrant pain: Secondary | ICD-10-CM | POA: Diagnosis not present

## 2018-04-19 DIAGNOSIS — M8588 Other specified disorders of bone density and structure, other site: Secondary | ICD-10-CM | POA: Diagnosis not present

## 2018-04-19 DIAGNOSIS — R1084 Generalized abdominal pain: Secondary | ICD-10-CM

## 2018-04-19 DIAGNOSIS — M4854XA Collapsed vertebra, not elsewhere classified, thoracic region, initial encounter for fracture: Secondary | ICD-10-CM | POA: Diagnosis not present

## 2018-04-19 DIAGNOSIS — I708 Atherosclerosis of other arteries: Secondary | ICD-10-CM | POA: Insufficient documentation

## 2018-04-19 DIAGNOSIS — Z7901 Long term (current) use of anticoagulants: Secondary | ICD-10-CM | POA: Diagnosis not present

## 2018-04-23 DIAGNOSIS — I482 Chronic atrial fibrillation, unspecified: Secondary | ICD-10-CM | POA: Diagnosis not present

## 2018-04-23 DIAGNOSIS — Z1389 Encounter for screening for other disorder: Secondary | ICD-10-CM | POA: Diagnosis not present

## 2018-04-23 DIAGNOSIS — F319 Bipolar disorder, unspecified: Secondary | ICD-10-CM | POA: Diagnosis not present

## 2018-04-23 DIAGNOSIS — Z1331 Encounter for screening for depression: Secondary | ICD-10-CM | POA: Diagnosis not present

## 2018-04-23 DIAGNOSIS — R300 Dysuria: Secondary | ICD-10-CM | POA: Diagnosis not present

## 2018-04-23 DIAGNOSIS — I10 Essential (primary) hypertension: Secondary | ICD-10-CM | POA: Diagnosis not present

## 2018-04-23 DIAGNOSIS — N390 Urinary tract infection, site not specified: Secondary | ICD-10-CM | POA: Diagnosis not present

## 2018-04-23 DIAGNOSIS — J449 Chronic obstructive pulmonary disease, unspecified: Secondary | ICD-10-CM | POA: Diagnosis not present

## 2018-04-23 DIAGNOSIS — I480 Paroxysmal atrial fibrillation: Secondary | ICD-10-CM | POA: Diagnosis not present

## 2018-04-23 DIAGNOSIS — I2510 Atherosclerotic heart disease of native coronary artery without angina pectoris: Secondary | ICD-10-CM | POA: Diagnosis not present

## 2018-04-23 DIAGNOSIS — Z7901 Long term (current) use of anticoagulants: Secondary | ICD-10-CM | POA: Diagnosis not present

## 2018-04-24 ENCOUNTER — Other Ambulatory Visit (HOSPITAL_COMMUNITY): Payer: Self-pay | Admitting: Internal Medicine

## 2018-04-24 DIAGNOSIS — R109 Unspecified abdominal pain: Secondary | ICD-10-CM

## 2018-04-26 ENCOUNTER — Other Ambulatory Visit: Payer: Self-pay

## 2018-04-26 ENCOUNTER — Encounter (HOSPITAL_COMMUNITY): Payer: Self-pay

## 2018-04-26 ENCOUNTER — Emergency Department (HOSPITAL_COMMUNITY)
Admission: EM | Admit: 2018-04-26 | Discharge: 2018-04-26 | Disposition: A | Discharge status: Home / Self Care | Payer: Medicare Other | Attending: Emergency Medicine | Admitting: Emergency Medicine

## 2018-04-26 ENCOUNTER — Emergency Department (HOSPITAL_COMMUNITY): Payer: Medicare Other

## 2018-04-26 DIAGNOSIS — J449 Chronic obstructive pulmonary disease, unspecified: Secondary | ICD-10-CM | POA: Insufficient documentation

## 2018-04-26 DIAGNOSIS — F17210 Nicotine dependence, cigarettes, uncomplicated: Secondary | ICD-10-CM | POA: Insufficient documentation

## 2018-04-26 DIAGNOSIS — R1013 Epigastric pain: Secondary | ICD-10-CM | POA: Insufficient documentation

## 2018-04-26 DIAGNOSIS — R1084 Generalized abdominal pain: Secondary | ICD-10-CM | POA: Diagnosis present

## 2018-04-26 DIAGNOSIS — K5730 Diverticulosis of large intestine without perforation or abscess without bleeding: Secondary | ICD-10-CM | POA: Diagnosis not present

## 2018-04-26 LAB — URINALYSIS, ROUTINE W REFLEX MICROSCOPIC
BILIRUBIN URINE: NEGATIVE
Glucose, UA: NEGATIVE mg/dL
Hgb urine dipstick: NEGATIVE
Ketones, ur: NEGATIVE mg/dL
LEUKOCYTES UA: NEGATIVE
Nitrite: NEGATIVE
PH: 5.5 (ref 5.0–8.0)
PROTEIN: NEGATIVE mg/dL

## 2018-04-26 LAB — LIPASE, BLOOD: Lipase: 28 U/L (ref 11–51)

## 2018-04-26 LAB — CBC WITH DIFFERENTIAL/PLATELET
Abs Immature Granulocytes: 0.02 10*3/uL (ref 0.00–0.07)
BASOS ABS: 0.1 10*3/uL (ref 0.0–0.1)
Basophils Relative: 1 %
EOS PCT: 2 %
Eosinophils Absolute: 0.2 10*3/uL (ref 0.0–0.5)
HEMATOCRIT: 44.8 % (ref 36.0–46.0)
Hemoglobin: 14 g/dL (ref 12.0–15.0)
Immature Granulocytes: 0 %
LYMPHS PCT: 38 %
Lymphs Abs: 2.4 10*3/uL (ref 0.7–4.0)
MCH: 27.8 pg (ref 26.0–34.0)
MCHC: 31.3 g/dL (ref 30.0–36.0)
MCV: 88.9 fL (ref 80.0–100.0)
MONOS PCT: 6 %
Monocytes Absolute: 0.4 10*3/uL (ref 0.1–1.0)
NRBC: 0 % (ref 0.0–0.2)
Neutro Abs: 3.2 10*3/uL (ref 1.7–7.7)
Neutrophils Relative %: 53 %
PLATELETS: 292 10*3/uL (ref 150–400)
RBC: 5.04 MIL/uL (ref 3.87–5.11)
RDW: 15.4 % (ref 11.5–15.5)
WBC: 6.1 10*3/uL (ref 4.0–10.5)

## 2018-04-26 LAB — COMPREHENSIVE METABOLIC PANEL
ALT: 17 U/L (ref 0–44)
AST: 20 U/L (ref 15–41)
Albumin: 4.3 g/dL (ref 3.5–5.0)
Alkaline Phosphatase: 63 U/L (ref 38–126)
Anion gap: 9 (ref 5–15)
BILIRUBIN TOTAL: 0.9 mg/dL (ref 0.3–1.2)
BUN: 10 mg/dL (ref 6–20)
CO2: 27 mmol/L (ref 22–32)
CREATININE: 1.06 mg/dL — AB (ref 0.44–1.00)
Calcium: 9.4 mg/dL (ref 8.9–10.3)
Chloride: 103 mmol/L (ref 98–111)
GFR calc Af Amer: >60 mL/min (ref >60)
GFR calc non Af Amer: >60 mL/min (ref >60)
GLUCOSE: 97 mg/dL (ref 70–99)
Potassium: 4 mmol/L (ref 3.5–5.1)
Sodium: 139 mmol/L (ref 135–145)
TOTAL PROTEIN: 8 g/dL (ref 6.5–8.1)

## 2018-04-26 MED ORDER — SODIUM CHLORIDE 0.9 % IV BOLUS
500.0000 mL | Freq: Once | INTRAVENOUS | Status: AC
  Administered 2018-04-26: 500 mL via INTRAVENOUS

## 2018-04-26 MED ORDER — ONDANSETRON HCL 4 MG/2ML IJ SOLN
4.0000 mg | Freq: Once | INTRAMUSCULAR | Status: AC
  Administered 2018-04-26: 4 mg via INTRAVENOUS
  Filled 2018-04-26: qty 2

## 2018-04-26 MED ORDER — HYDROMORPHONE HCL 1 MG/ML IJ SOLN
0.5000 mg | Freq: Once | INTRAMUSCULAR | Status: AC
  Administered 2018-04-26: 0.5 mg via INTRAVENOUS
  Filled 2018-04-26: qty 1

## 2018-04-26 MED ORDER — DICYCLOMINE HCL 20 MG PO TABS: ORAL_TABLET | ORAL | 0 refills | Status: DC

## 2018-04-26 MED ORDER — BUSPIRONE HCL 5 MG PO TABS: 15.0000 mg | ORAL_TABLET | Freq: Once | ORAL | Status: DC

## 2018-04-26 MED ORDER — IOPAMIDOL (ISOVUE-300) INJECTION 61%
100.0000 mL | Freq: Once | INTRAVENOUS | Status: AC | PRN
  Administered 2018-04-26: 100 mL via INTRAVENOUS

## 2018-04-26 NOTE — ED Triage Notes (Signed)
Pt reports chronic abd pain.  Says has been progressively worse.  PT is scheduled for a ct scan on Dec 24.  Pt says pain worse today and feels sharper than usual.  Reports pain is in LUQ and radiates to back and left shoulder.  VOmited x 1.  Denies diarrhea.  LBM was this morning and was normal per pt.

## 2018-04-26 NOTE — ED Provider Notes (Signed)
[facility] EMERGENCY DEPARTMENT Provider Note   CSN: [id number] Arrival date & time: 04/26/18  9562     History   Chief Complaint Chief Complaint  Patient presents with  . Abdominal Pain    HPI Haley Patton is a 52 y.o. female.  Patient complains of generalized abdominal pain.  She has been hurting for weeks and weeks.  Colonoscopy has been on remarkable.  The history is provided by the patient. No language interpreter was used.  Abdominal Pain   This is a new problem. The current episode started 12 to 24 hours ago. The problem occurs constantly. The problem has not changed since onset.The pain is associated with an unknown factor. The pain is located in the generalized abdominal region. The quality of the pain is aching. The pain is at a severity of 4/10. The pain is moderate. Pertinent negatives include anorexia, diarrhea, frequency, hematuria and headaches. Nothing aggravates the symptoms.    Past Medical History:  Diagnosis Date  . Bipolar 1 disorder ([place])   . COPD (chronic obstructive pulmonary disease) ([place])   . DJD (degenerative joint disease)   . PTSD (post-traumatic stress disorder)     There are no active problems to display for this patient.   Past Surgical History:  Procedure Laterality Date  . ABDOMINAL HYSTERECTOMY    . CERVICAL SPINE SURGERY    . VALVE REPLACEMENT       OB History   No obstetric history on file.      Home Medications    Prior to Admission medications   Medication Sig Start Date End Date Taking? Authorizing Provider  dicyclomine (BENTYL) 20 MG tablet Take 1 every 8 hours as needed for abdominal cramping 04/26/18   Milton Ferguson, MD  lidocaine (XYLOCAINE) 2 % solution Use as directed 15 mLs in the mouth or throat every 3 (three) hours as needed for mouth pain. 03/14/18   McDonald, Mia A, PA-C    Family History No family history on file.  Social History Social History   Tobacco Use  . Smoking status: Current Every Day  Smoker    Packs/day: 1.50    Types: Cigarettes  . Smokeless tobacco: Never Used  Substance Use Topics  . Alcohol use: Never    Frequency: Never  . Drug use: Never     Allergies   Lithium   Review of Systems Review of Systems  Constitutional: Negative for appetite change and fatigue.  HENT: Negative for congestion, ear discharge and sinus pressure.   Eyes: Negative for discharge.  Respiratory: Negative for cough.   Cardiovascular: Negative for chest pain.  Gastrointestinal: Positive for abdominal pain. Negative for anorexia and diarrhea.  Genitourinary: Negative for frequency and hematuria.  Musculoskeletal: Negative for back pain.  Skin: Negative for rash.  Neurological: Negative for seizures and headaches.  Psychiatric/Behavioral: Negative for hallucinations.     Physical Exam Updated Vital Signs BP 132/69   Pulse 61   Temp 98 F (36.7 C) (Oral)   Resp 19   Ht 5\' 4"  (1.626 m)   Wt 101.2 kg   SpO2 95%   BMI 38.28 kg/m   Physical Exam Constitutional:      Appearance: She is well-developed.  HENT:     Head: Normocephalic.     Nose: No congestion.  Eyes:     General: No scleral icterus.    Conjunctiva/sclera: Conjunctivae normal.  Neck:     Musculoskeletal: Neck supple.     Thyroid: No thyromegaly.  Cardiovascular:  Rate and Rhythm: Normal rate and regular rhythm.     Heart sounds: No murmur. No friction rub. No gallop.   Pulmonary:     Breath sounds: No stridor. No wheezing or rales.  Chest:     Chest wall: No tenderness.  Abdominal:     General: There is no distension.     Tenderness: There is abdominal tenderness. There is no rebound.  Musculoskeletal: Normal range of motion.  Lymphadenopathy:     Cervical: No cervical adenopathy.  Skin:    Findings: No erythema or rash.  Neurological:     Mental Status: She is oriented to person, place, and time.     Motor: No abnormal muscle tone.     Coordination: Coordination normal.  Psychiatric:          Behavior: Behavior normal.      ED Treatments / Results  Labs (all labs ordered are listed, but only abnormal results are displayed) Labs Reviewed  COMPREHENSIVE METABOLIC PANEL - Abnormal; Notable for the following components:      Result Value   Creatinine, Ser 1.06 (*)    All other components within normal limits  URINALYSIS, ROUTINE W REFLEX MICROSCOPIC - Abnormal; Notable for the following components:   Specific Gravity, Urine <1.005 (*)    All other components within normal limits  CBC WITH DIFFERENTIAL/PLATELET  LIPASE, BLOOD    EKG EKG Interpretation  Date/Time:  Thursday April 26 2018 09:48:11 EST Ventricular Rate:  60 PR Interval:    QRS Duration: 104 QT Interval:  467 QTC Calculation: 467 R Axis:   55 Text Interpretation:  Sinus rhythm Nonspecific T abnrm, anterolateral leads Baseline wander in lead(s) V1 Confirmed by Milton Ferguson [phone]) on 04/26/2018 1:22:26 PM   Radiology Ct Abdomen Pelvis W Contrast  Result Date: 04/26/2018 CLINICAL DATA:  Pt reports chronic abd pain. Says has been progressively worse. Pt says pain worse today and feels sharper than usual. Reports pain is in LUQ and radiates to back and left shoulder. VOmited x 1. HX COPD, hysterectomy EXAM: CT ABDOMEN AND PELVIS WITH CONTRAST TECHNIQUE: Multidetector CT imaging of the abdomen and pelvis was performed using the standard protocol following bolus administration of intravenous contrast. CONTRAST:  123mL ISOVUE-300 IOPAMIDOL (ISOVUE-300) INJECTION 61% COMPARISON:  Ultrasound 04/19/2018 FINDINGS: Lower chest: No pleural or pericardial effusion. Visualized lung bases clear. Hepatobiliary: No focal liver abnormality is seen. No gallstones, gallbladder wall thickening, or biliary dilatation. Pancreas: Unremarkable. No pancreatic ductal dilatation or surrounding inflammatory changes. Spleen: Normal in size without focal abnormality. Adrenals/Urinary Tract: Adrenal glands are unremarkable. Kidneys  are normal, without renal calculi, focal lesion, or hydronephrosis. Bladder is unremarkable. Stomach/Bowel: Stomach and small bowel are nondilated. Normal appendix. Colon is nondistended, with scattered sigmoid diverticula; no adjacent inflammatory/edematous change or abscess. Vascular/Lymphatic: Scattered calcified aortoiliac atheromatous plaque without aneurysm or high-grade stenosis. Portal vein patent. Circumaortic left renal vein, an anatomic variant. No abdominal or pelvic adenopathy. Reproductive: Status post hysterectomy. No adnexal masses. Other: No ascites. Right pelvic phleboliths. Surgical staple line anterior to the right psoas. No free air. Musculoskeletal: No acute or significant osseous findings. IMPRESSION: 1. No acute findings. 2. Scattered sigmoid diverticula. 3. Aortoiliac  atherosclerosis (ICD10-170.0). Electronically Signed   By: Lucrezia Europe M.D.   On: 04/26/2018 11:32    Procedures Procedures (including critical care time)  Medications Ordered in ED Medications  busPIRone (BUSPAR) tablet 15 mg (has no administration in time range)  sodium chloride 0.9 % bolus 500 mL (0 mLs Intravenous  Stopped 04/26/18 1151)  ondansetron (ZOFRAN) injection 4 mg (4 mg Intravenous Given 04/26/18 1032)  HYDROmorphone (DILAUDID) injection 0.5 mg (0.5 mg Intravenous Given 04/26/18 1032)  iopamidol (ISOVUE-300) 61 % injection 100 mL (100 mLs Intravenous Contrast Given 04/26/18 1105)  HYDROmorphone (DILAUDID) injection 0.5 mg (0.5 mg Intravenous Given 04/26/18 1304)     Initial Impression / Assessment and Plan / ED Course  I have reviewed the triage vital signs and the nursing notes.  Pertinent labs & imaging results that were available during my care of the patient were reviewed by me and considered in my medical decision making (see chart for details).     Labs and CT scan unremarkable.  Patient given Bentyl for the discomfort and referred to GI  Final Clinical Impressions(s) / ED Diagnoses     Final diagnoses:  Epigastric pain    ED Discharge Orders         Ordered    dicyclomine (BENTYL) 20 MG tablet     04/26/18 1323           Milton Ferguson, MD 04/26/18 1329

## 2018-04-26 NOTE — Discharge Instructions (Signed)
Follow-up with Dr. Gala Romney in 1-2 weeks

## 2018-04-30 ENCOUNTER — Telehealth: Payer: Self-pay | Admitting: Gastroenterology

## 2018-04-30 NOTE — Telephone Encounter (Signed)
Pt seen at [facility] ER and was recommended to follow up with [place]. Can we accept her as a new patient?

## 2018-04-30 NOTE — Telephone Encounter (Signed)
Yes

## 2018-05-01 ENCOUNTER — Encounter: Payer: Self-pay | Admitting: Gastroenterology

## 2018-05-01 DIAGNOSIS — K219 Gastro-esophageal reflux disease without esophagitis: Secondary | ICD-10-CM | POA: Diagnosis not present

## 2018-05-01 DIAGNOSIS — J449 Chronic obstructive pulmonary disease, unspecified: Secondary | ICD-10-CM | POA: Diagnosis not present

## 2018-05-01 DIAGNOSIS — R1011 Right upper quadrant pain: Secondary | ICD-10-CM | POA: Diagnosis not present

## 2018-05-01 DIAGNOSIS — I482 Chronic atrial fibrillation, unspecified: Secondary | ICD-10-CM | POA: Diagnosis not present

## 2018-05-01 NOTE — Telephone Encounter (Signed)
OV made and letter mailed °

## 2018-05-08 ENCOUNTER — Encounter (HOSPITAL_COMMUNITY): Payer: Self-pay

## 2018-05-08 ENCOUNTER — Ambulatory Visit (HOSPITAL_COMMUNITY): Payer: Medicare Other

## 2018-05-10 ENCOUNTER — Ambulatory Visit (INDEPENDENT_AMBULATORY_CARE_PROVIDER_SITE_OTHER): Payer: Medicare Other | Admitting: Internal Medicine

## 2018-05-10 ENCOUNTER — Encounter (INDEPENDENT_AMBULATORY_CARE_PROVIDER_SITE_OTHER): Payer: Self-pay | Admitting: Internal Medicine

## 2018-05-10 VITALS — BP 130/70 | HR 68 | Temp 98.6°F | Ht 64.5 in | Wt 221.2 lb

## 2018-05-10 DIAGNOSIS — R1012 Left upper quadrant pain: Secondary | ICD-10-CM

## 2018-05-10 DIAGNOSIS — R109 Unspecified abdominal pain: Secondary | ICD-10-CM

## 2018-05-10 NOTE — Patient Instructions (Signed)
CBC, CMET, UA 

## 2018-05-10 NOTE — Progress Notes (Signed)
Subjective:    Patient ID: Haley Patton, female    DOB: November 12, 1965, 52 y.o.   MRN: [id number]  HPI Referred by Dr. Legrand Rams for abdominal pain. Seen in ED due to abdominal pain. CT scan of abdomen 04/26/2018 did not show anything significant. She was started on Dicyclomine.  US abdomen 04/19/2018 revealed  IMPRESSION: 1. No gallstones.  No ductal dilatation. 2. Echogenic liver parenchyma consistent with hepatic steatosis. No focal abnormality. 3. Only the tail of the pancreas is obscured. The remainder of the pancreas appears normal. Today she says she has a pain in her left flank for about a year. She says she was scheduled for an EGD/Colonoscopy in [place] but had to move due to family circumstances. She says she had a colonoscopy 2 months ago and she had polyps.  Biopsy in epic states they wree hyperplastic. (Dr Anthony Sar)  She says the pain is under her left lateral ribs and radiates into her back.  Hx of atrial fib, CHF, animal heart valve and maintained on Coumadin.  Appetite is good. No weight loss. Her BMs are okay. She takes Fiber and has a BM four times a weeks. No melena or BRRB.   CBC    Component Value Date/Time   WBC 6.1 04/26/2018 1007   RBC 5.04 04/26/2018 1007   HGB 14.0 04/26/2018 1007   HCT 44.8 04/26/2018 1007   PLT 292 04/26/2018 1007   MCV 88.9 04/26/2018 1007   MCH 27.8 04/26/2018 1007   MCHC 31.3 04/26/2018 1007   RDW 15.4 04/26/2018 1007   LYMPHSABS 2.4 04/26/2018 1007   MONOABS 0.4 04/26/2018 1007   EOSABS 0.2 04/26/2018 1007   BASOSABS 0.1 04/26/2018 1007    Urinalysis    Component Value Date/Time   COLORURINE YELLOW 04/26/2018 0944   APPEARANCEUR CLEAR 04/26/2018 0944   LABSPEC <1.005 (L) 04/26/2018 0944   PHURINE 5.5 04/26/2018 0944   GLUCOSEU NEGATIVE 04/26/2018 0944   HGBUR NEGATIVE 04/26/2018 0944   BILIRUBINUR NEGATIVE 04/26/2018 0944   KETONESUR NEGATIVE 04/26/2018 0944   PROTEINUR NEGATIVE 04/26/2018 0944   NITRITE NEGATIVE  04/26/2018 0944   LEUKOCYTESUR NEGATIVE 04/26/2018 0944   CMP Latest Ref Rng & Units 04/26/2018  Glucose 70 - 99 mg/dL 97  BUN 6 - 20 mg/dL 10  Creatinine 0.44 - 1.00 mg/dL 1.06(H)  Sodium 135 - 145 mmol/L 139  Potassium 3.5 - 5.1 mmol/L 4.0  Chloride 98 - 111 mmol/L 103  CO2 22 - 32 mmol/L 27  Calcium 8.9 - 10.3 mg/dL 9.4  Total Protein 6.5 - 8.1 g/dL 8.0  Total Bilirubin 0.3 - 1.2 mg/dL 0.9  Alkaline Phos 38 - 126 U/L 63  AST 15 - 41 U/L 20  ALT 0 - 44 U/L 17        Review of Systems    Past Medical History:  Diagnosis Date  . Atrial fibrillation ([place])   . Bipolar 1 disorder ([place])   . CHF (congestive heart failure) ([place])   . COPD (chronic obstructive pulmonary disease) ([place])   . DJD (degenerative joint disease)   . High cholesterol   . PTSD (post-traumatic stress disorder)     Past Surgical History:  Procedure Laterality Date  . ABDOMINAL HYSTERECTOMY    . CERVICAL SPINE SURGERY    . VALVE REPLACEMENT      Allergies  Allergen Reactions  . Lithium     Vomiting     Current Outpatient Medications on File Prior to Visit  Medication Sig  Dispense Refill  . busPIRone (BUSPAR) 10 MG tablet Take 10 mg by mouth at bedtime.    . dicyclomine (BENTYL) 20 MG tablet Take 1 every 8 hours as needed for abdominal cramping 20 tablet 0  . furosemide (LASIX) 40 MG tablet Take 40 mg by mouth daily.    . Lurasidone HCl (LATUDA) 60 MG TABS Take by mouth daily.    . montelukast (SINGULAIR) 10 MG tablet Take 10 mg by mouth at bedtime.    Marland Kitchen omeprazole (PRILOSEC) 40 MG capsule Take 40 mg by mouth daily.    . pravastatin (PRAVACHOL) 40 MG tablet Take 40 mg by mouth daily.    . pregabalin (LYRICA) 150 MG capsule Take 150 mg by mouth 3 (three) times daily.    Marland Kitchen warfarin (COUMADIN) 2.5 MG tablet Take 2.5 mg by mouth daily. Takes 5mg  daily.     No current facility-administered medications on file prior to visit.        Objective:   Physical Exam Blood pressure 130/70, pulse 68,  temperature 98.6 F (37 C), height 5' 4.5" (1.638 m), weight 221 lb 3.2 oz (100.3 kg). Alert and oriented. Skin warm and dry. Oral mucosa is moist.   . Sclera anicteric, conjunctivae is pink. Thyroid not enlarged. No cervical lymphadenopathy. Lungs clear. Heart regular rate and rhythm.  Abdomen is soft. Bowel sounds are positive. No hepatomegaly. No abdominal masses felt. No tenderness.  No edema to lower extremities.         Assessment & Plan:  Left flank pain. Left upper quadrant pain.  I do not think this is GI. Will get a CBC,CMET, and urinalysis. Probably needs to see a urologist. Colonoscopy UTD.

## 2018-05-11 LAB — CBC WITH DIFFERENTIAL/PLATELET
Absolute Monocytes: 482 cells/uL (ref 200–950)
BASOS PCT: 0.6 %
Basophils Absolute: 47 cells/uL (ref 0–200)
EOS ABS: 142 {cells}/uL (ref 15–500)
EOS PCT: 1.8 %
HCT: 43.3 % (ref 35.0–45.0)
HEMOGLOBIN: 14.5 g/dL (ref 11.7–15.5)
LYMPHS ABS: 3042 {cells}/uL (ref 850–3900)
MCH: 28.4 pg (ref 27.0–33.0)
MCHC: 33.5 g/dL (ref 32.0–36.0)
MCV: 84.7 fL (ref 80.0–100.0)
MPV: 10.7 fL (ref 7.5–12.5)
Monocytes Relative: 6.1 %
NEUTROS ABS: 4187 {cells}/uL (ref 1500–7800)
Neutrophils Relative %: 53 %
Platelets: 312 10*3/uL (ref 140–400)
RBC: 5.11 10*6/uL — ABNORMAL HIGH (ref 3.80–5.10)
RDW: 14.5 % (ref 11.0–15.0)
Total Lymphocyte: 38.5 %
WBC: 7.9 10*3/uL (ref 3.8–10.8)

## 2018-05-11 LAB — COMPREHENSIVE METABOLIC PANEL
AG RATIO: 1.7 (calc) (ref 1.0–2.5)
ALT: 11 U/L (ref 6–29)
AST: 17 U/L (ref 10–35)
Albumin: 4.5 g/dL (ref 3.6–5.1)
Alkaline phosphatase (APISO): 67 U/L (ref 33–130)
BUN/Creatinine Ratio: 12 (calc) (ref 6–22)
BUN: 14 mg/dL (ref 7–25)
CHLORIDE: 100 mmol/L (ref 98–110)
CO2: 29 mmol/L (ref 20–32)
Calcium: 9.5 mg/dL (ref 8.6–10.4)
Creat: 1.14 mg/dL — ABNORMAL HIGH (ref 0.50–1.05)
GLOBULIN: 2.7 g/dL (ref 1.9–3.7)
GLUCOSE: 80 mg/dL (ref 65–139)
Potassium: 4.3 mmol/L (ref 3.5–5.3)
SODIUM: 139 mmol/L (ref 135–146)
TOTAL PROTEIN: 7.2 g/dL (ref 6.1–8.1)
Total Bilirubin: 0.5 mg/dL (ref 0.2–1.2)

## 2018-05-11 LAB — URINALYSIS
Bilirubin Urine: NEGATIVE
Glucose, UA: NEGATIVE
Hgb urine dipstick: NEGATIVE
Ketones, ur: NEGATIVE
LEUKOCYTES UA: NEGATIVE
NITRITE: NEGATIVE
PH: 6 (ref 5.0–8.0)
Protein, ur: NEGATIVE
SPECIFIC GRAVITY, URINE: 1.018 (ref 1.001–1.03)

## 2018-05-21 DIAGNOSIS — I480 Paroxysmal atrial fibrillation: Secondary | ICD-10-CM | POA: Diagnosis not present

## 2018-05-21 DIAGNOSIS — Z7901 Long term (current) use of anticoagulants: Secondary | ICD-10-CM | POA: Diagnosis not present

## 2018-05-22 DIAGNOSIS — J449 Chronic obstructive pulmonary disease, unspecified: Secondary | ICD-10-CM | POA: Diagnosis not present

## 2018-05-22 DIAGNOSIS — M940 Chondrocostal junction syndrome [Tietze]: Secondary | ICD-10-CM | POA: Diagnosis not present

## 2018-05-22 DIAGNOSIS — M549 Dorsalgia, unspecified: Secondary | ICD-10-CM | POA: Diagnosis not present

## 2018-05-22 DIAGNOSIS — F319 Bipolar disorder, unspecified: Secondary | ICD-10-CM | POA: Diagnosis not present

## 2018-05-28 ENCOUNTER — Ambulatory Visit (INDEPENDENT_AMBULATORY_CARE_PROVIDER_SITE_OTHER): Payer: Medicare Other | Admitting: Cardiology

## 2018-05-28 ENCOUNTER — Encounter: Payer: Self-pay | Admitting: *Deleted

## 2018-05-28 ENCOUNTER — Encounter: Payer: Self-pay | Admitting: Cardiology

## 2018-05-28 VITALS — BP 111/70 | HR 81 | Ht 64.0 in | Wt 227.2 lb

## 2018-05-28 DIAGNOSIS — Z952 Presence of prosthetic heart valve: Secondary | ICD-10-CM | POA: Diagnosis not present

## 2018-05-28 DIAGNOSIS — I4891 Unspecified atrial fibrillation: Secondary | ICD-10-CM | POA: Insufficient documentation

## 2018-05-28 MED ORDER — METOPROLOL TARTRATE 25 MG PO TABS: 12.5000 mg | ORAL_TABLET | Freq: Two times a day (BID) | ORAL | 3 refills | Status: DC

## 2018-05-28 NOTE — Patient Instructions (Addendum)
Medication Instructions:   Your physician recommends that you continue on your current medications as directed. Please refer to the Current Medication list given to you today.   Restart metoprolol tartrate 12.5 mg by mouth twice daily.  Labwork:  NONE  Testing/Procedures:  NONE  Follow-Up:  Your physician recommends that you schedule a follow-up appointment in: 6 months. You will receive a reminder letter in the mail in about 4 months reminding you to call and schedule your appointment. If you don't receive this letter, please contact our office.  Any Other Special Instructions Will Be Listed Below (If Applicable).  You have been referred to the Coumadin [facility]  If you need a refill on your cardiac medications before your next appointment, please call your pharmacy.

## 2018-05-28 NOTE — Progress Notes (Signed)
Clinical Summary Haley Patton is a 53 y.o.female previuosly followed by [facility] cardiology. This is her first visit with [place], seen today as a new patient.   1. PAF/flutter - on coumadin. INR followed at [facility]. - no recent palpitations. Has not been taking metoprolol, stopped on her own.   2. History of rheumatic mitral valve disease - from [facility] notes history of bioprosthetic MVR, MAZE, and LAA ligation May 2017 with a 27 mm St Jude porcine mitral valve. Surgery done in [place].  - 07/2017 echo [facility]: LVEF 60-65%, normal MVR  - chronic SOB she related to her COPD. CHronic LE edema that is controlled with lasix.    3. COPD - followed by pcp    Past Medical History:  Diagnosis Date  . Atrial fibrillation ([place])   . Bipolar 1 disorder ([place])   . CHF (congestive heart failure) ([place])   . COPD (chronic obstructive pulmonary disease) ([place])   . DJD (degenerative joint disease)   . High cholesterol   . PTSD (post-traumatic stress disorder)      Allergies  Allergen Reactions  . Lithium     Vomiting      Current Outpatient Medications  Medication Sig Dispense Refill  . aspirin EC 81 MG tablet Take 81 mg by mouth daily.    . busPIRone (BUSPAR) 10 MG tablet Take 10 mg by mouth at bedtime.    . dicyclomine (BENTYL) 20 MG tablet Take 1 every 8 hours as needed for abdominal cramping 20 tablet 0  . furosemide (LASIX) 40 MG tablet Take 40 mg by mouth daily.    . Lurasidone HCl (LATUDA) 60 MG TABS Take by mouth daily.    . montelukast (SINGULAIR) 10 MG tablet Take 10 mg by mouth at bedtime.    Marland Kitchen omeprazole (PRILOSEC) 40 MG capsule Take 40 mg by mouth daily.    . pravastatin (PRAVACHOL) 40 MG tablet Take 40 mg by mouth daily.    . pregabalin (LYRICA) 150 MG capsule Take 150 mg by mouth 3 (three) times daily.    Marland Kitchen warfarin (COUMADIN) 2.5 MG tablet Take 2.5 mg by mouth daily. Takes 5mg  daily.     No current facility-administered medications for this visit.      Past Surgical  History:  Procedure Laterality Date  . ABDOMINAL HYSTERECTOMY    . CERVICAL SPINE SURGERY    . VALVE REPLACEMENT       Allergies  Allergen Reactions  . Lithium     Vomiting       No family history on file.   Social History Haley Patton reports that she has been smoking cigarettes. She has been smoking about 1.50 packs per day. She has never used smokeless tobacco. Haley Patton reports no history of alcohol use.   Review of Systems CONSTITUTIONAL: No weight loss, fever, chills, weakness or fatigue.  HEENT: Eyes: No visual loss, blurred vision, double vision or yellow sclerae.No hearing loss, sneezing, congestion, runny nose or sore throat.  SKIN: No rash or itching.  CARDIOVASCULAR: per hpi RESPIRATORY: per hpi  GASTROINTESTINAL: No anorexia, nausea, vomiting or diarrhea. No abdominal pain or blood.  GENITOURINARY: No burning on urination, no polyuria NEUROLOGICAL: No headache, dizziness, syncope, paralysis, ataxia, numbness or tingling in the extremities. No change in bowel or bladder control.  MUSCULOSKELETAL: No muscle, back pain, joint pain or stiffness.  LYMPHATICS: No enlarged nodes. No history of splenectomy.  PSYCHIATRIC: No history of depression or anxiety.  ENDOCRINOLOGIC: No reports of sweating,  cold or heat intolerance. No polyuria or polydipsia.  Marland Kitchen   Physical Examination There were no vitals filed for this visit. Filed Weights   05/28/18 1439  Weight: 227 lb 3.2 oz (103.1 kg)    Gen: resting comfortably, no acute distress HEENT: no scleral icterus, pupils equal round and reactive, no palptable cervical adenopathy,  CV: RRR, no m/r/g, no jvd Resp: Clear to auscultation bilaterally GI: abdomen is soft, non-tender, non-distended, normal bowel sounds, no hepatosplenomegaly MSK: extremities are warm, no edema.  Skin: warm, no rash Neuro:  no focal deficits Psych: appropriate affect   Diagnostic Studies  Echocardiogram: 06/15/2016 Fair quality study  with normal LV systolic function, right atrial enlargement, mild mitral stenosis, bioprosthetic mitral valve, moderate pulmonary hypertension, mild aortic regurgitation.  11/13/2015 Normal LV size with mild concentyric LVH with borderline normal systolic function EF is 49-70%.  There is severe BAE.  Intact bioprosthesis in the mitral position. There is trace MR.  The prosthetic mitral valve mean gradient is normal at 89mmHg  There is mild TR with mild pulmonary hypertension  There is moderate AI.  08/26/2015 (TEE) The left ventricle is normal in size. Left ventricular systolic function is normal. The right ventricle is normal size. The right ventricular ejection fraction is normal. Saline contrast injected, no interatrial shunt. The mitral valve leaflets appear thickened, but open well. The mitral valve appears rheumatic. There is severe mitral regurgitation (4+). There is mild tricuspid regurgitation (1+). The aortic valve opens well. The aortic valve is tri-leaflet. There is moderate aortic regurgitation (2+). 2 eccentric jets from right coronary cusp, 1 directed anteriorly, 1 directed posteriorly  08/25/2015 1. Left ventricle is normal in size and function. 2. Mild to moderate mitral stenosis. Calculated MVA is 1.64 cm2 by pressure half-time. Mean gradient is 7 mmHg. Moderate to moderately severe (2-3+) mitral regurgitation. 3. Aortic valve is trileaflet with eccentric mild (1+) aortic regurgitation. May be underestimated given eccentricity of regurgitant jet.  07/05/2015 Left ventricular systolic function is normal. The left ventricle is mildly dilated. Grade II Diastolic Dysfunction (moderate increase in filling pressure). LA Volume is c/w moderately dilated (42 ml/m2- 48 ml/m2) Mild to moderate mitral stenosis There is moderate mitral regurgitation (2+). There is mild tricuspid regurgitation (1+). Right ventricular systolic pressure is elevated at 40-59mm Hg,  consistent with moderate pulmonary hypertension. Mild aortic regurgitation.  Heart Catheterization:  08/27/2015 1. Essentially normal coronary arteries with minor luminal irregularities of mid RCA and otherwise normal coronaries. 2. Normal LV end diastolic pressure at 14 mmHg.  External Cardioversion: 10/09/2015 The risk of cardiac thrombi was assessed. Patient received a total of 1 synchronized biphasic shock of 100. Cardioversion was successful with post-cardioversion rhythm of Sinus. Complications: None  Intraoperative TEE: 10/01/2015 1. 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 2. Preserved biventricular function. The left ventricular function is in the 60% range.  07/2017 echo Interpretation Summary 1. Left ventricle is normal in size and function with EF 60-65%. Normal LV wall thickness. 2. Right ventricle is normal in size and function. 3. Status post bioprosthetic mitral valve replacement. Valve appears well seated and functioning well. Mean gradient across the valve 4 mmHg. 4. Aortic valve appears trileaflet with mild sclerosis. No evidence of any aortic stenosis. Mild aortic regurgitation. 5. Atria are normal in size. 6. Normal (-) agitated saline bubble study. No interatrial shunt. No PFO/ASD.  Assessment and Plan  1. PAF - no recent symptoms - she will restart her home lopressor 12.5mg  bid, she had stopped  on her own. - continue coumadin, we will refer to our coumadin clinic - EKG today shows she is in normal sinus rhythm  2. History of MVR - bioprosthetic valve with normal function by 07/2017 - no symptoms, continue to monitor  F/u 6 months. Request pcp labs.        Arnoldo Lenis, M.D.

## 2018-05-29 DIAGNOSIS — Z5181 Encounter for therapeutic drug level monitoring: Secondary | ICD-10-CM | POA: Insufficient documentation

## 2018-05-31 ENCOUNTER — Ambulatory Visit (INDEPENDENT_AMBULATORY_CARE_PROVIDER_SITE_OTHER): Payer: Medicare Other | Admitting: Pharmacist

## 2018-05-31 DIAGNOSIS — Z5181 Encounter for therapeutic drug level monitoring: Secondary | ICD-10-CM | POA: Diagnosis not present

## 2018-05-31 DIAGNOSIS — I4891 Unspecified atrial fibrillation: Secondary | ICD-10-CM

## 2018-05-31 LAB — POCT INR: INR: 2.5 (ref 2.0–3.0)

## 2018-05-31 NOTE — Patient Instructions (Addendum)
Description   Continue 2 tablets every day. Recheck in 1 week      A full discussion of the nature of anticoagulants has been carried out.  A benefit risk analysis has been presented to the patient, so that they understand the justification for choosing anticoagulation at this time. The need for frequent and regular monitoring, precise dosage adjustment and compliance is stressed.  Side effects of potential bleeding are discussed.  The patient should avoid any OTC items containing aspirin or ibuprofen, and should avoid great swings in general diet.  Avoid alcohol consumption.  Call if any signs of abnormal bleeding.

## 2018-06-04 NOTE — Progress Notes (Signed)
INR goal 2-3 is fine for her. She has been doing home monitoring for a long time with [facility] and is very comfortable with it, she is transferring her care to use and would very much like to continue this home arrangement, I am ok with it   Zandra Abts MD

## 2018-06-05 ENCOUNTER — Telehealth: Payer: Self-pay | Admitting: Pharmacist

## 2018-06-05 DIAGNOSIS — Z7901 Long term (current) use of anticoagulants: Secondary | ICD-10-CM | POA: Diagnosis not present

## 2018-06-05 DIAGNOSIS — Z952 Presence of prosthetic heart valve: Secondary | ICD-10-CM | POA: Diagnosis not present

## 2018-06-05 DIAGNOSIS — I052 Rheumatic mitral stenosis with insufficiency: Secondary | ICD-10-CM | POA: Diagnosis not present

## 2018-06-05 NOTE — Telephone Encounter (Signed)
INR goal 2-3 is fine for her. She has been doing home monitoring for a long time with [facility] and is very comfortable with it, she is transferring her care to use and would very much like to continue this home arrangement, I am ok with it   Haley Abts MD  Will maintain INR at 2-3 per above and set up home monitoring at visit on Thursday.

## 2018-06-05 NOTE — Telephone Encounter (Signed)
-----   Message from Arnoldo Lenis, MD sent at 06/04/2018  9:10 AM EST -----   ----- Message ----- From: Erskine Emery, [place] Sent: 05/31/2018  11:37 AM EST To: Arnoldo Lenis, MD  Dr. Harl Bowie - Pt has a tissue valve and Afib, but was previously maintained at 2.5-3.5 INR. Based on guidelines for her Afib indication she should be 2.0-3.0. Please advise if you would like for me to change her back to the higher INR range. She also has a home monitor, which we generally reserve only for patients that are unable to come to appts for transportation or other clinical/social reasons. Would you like for her to be monitored remotely or in office? Please advise. Thank you!

## 2018-06-13 ENCOUNTER — Ambulatory Visit (HOSPITAL_COMMUNITY): Payer: Self-pay | Admitting: Psychiatry

## 2018-06-13 DIAGNOSIS — G894 Chronic pain syndrome: Secondary | ICD-10-CM | POA: Diagnosis not present

## 2018-06-13 DIAGNOSIS — F319 Bipolar disorder, unspecified: Secondary | ICD-10-CM | POA: Diagnosis not present

## 2018-06-13 DIAGNOSIS — J449 Chronic obstructive pulmonary disease, unspecified: Secondary | ICD-10-CM | POA: Diagnosis not present

## 2018-06-13 DIAGNOSIS — I482 Chronic atrial fibrillation, unspecified: Secondary | ICD-10-CM | POA: Diagnosis not present

## 2018-06-19 DIAGNOSIS — Z7901 Long term (current) use of anticoagulants: Secondary | ICD-10-CM | POA: Diagnosis not present

## 2018-06-19 DIAGNOSIS — I480 Paroxysmal atrial fibrillation: Secondary | ICD-10-CM | POA: Diagnosis not present
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
## 2018-06-29 ENCOUNTER — Encounter (INDEPENDENT_AMBULATORY_CARE_PROVIDER_SITE_OTHER): Payer: Self-pay

## 2018-06-29 ENCOUNTER — Ambulatory Visit (INDEPENDENT_AMBULATORY_CARE_PROVIDER_SITE_OTHER): Payer: Medicare Other | Admitting: Psychiatry

## 2018-06-29 ENCOUNTER — Encounter (HOSPITAL_COMMUNITY): Payer: Self-pay | Admitting: Psychiatry

## 2018-06-29 VITALS — BP 120/74 | HR 64 | Ht 64.0 in | Wt 226.0 lb

## 2018-06-29 DIAGNOSIS — F0630 Mood disorder due to known physiological condition, unspecified: Secondary | ICD-10-CM | POA: Diagnosis not present

## 2018-06-29 DIAGNOSIS — F4310 Post-traumatic stress disorder, unspecified: Secondary | ICD-10-CM

## 2018-06-29 MED ORDER — LURASIDONE HCL 40 MG PO TABS: 40.0000 mg | ORAL_TABLET | Freq: Every day | ORAL | 0 refills | Status: DC

## 2018-06-29 NOTE — Patient Instructions (Signed)
1. Continue Latuda 40 mg daily 2. Referral to therapy  3. Check TSH 4. Obtain record from her prior psyhciatrist 5. Return to clinic in one month for 30 mins

## 2018-07-06 ENCOUNTER — Ambulatory Visit (HOSPITAL_COMMUNITY)
Admission: RE | Admit: 2018-07-06 | Discharge: 2018-07-06 | Disposition: A | Discharge status: Home / Self Care | Payer: Medicare Other | Source: Ambulatory Visit | Attending: Internal Medicine | Admitting: Internal Medicine

## 2018-07-06 ENCOUNTER — Other Ambulatory Visit (HOSPITAL_COMMUNITY): Payer: Self-pay | Admitting: Internal Medicine

## 2018-07-06 DIAGNOSIS — M25552 Pain in left hip: Secondary | ICD-10-CM | POA: Insufficient documentation

## 2018-07-06 DIAGNOSIS — M549 Dorsalgia, unspecified: Secondary | ICD-10-CM | POA: Diagnosis not present

## 2018-07-13 ENCOUNTER — Telehealth: Payer: Self-pay | Admitting: Orthopedic Surgery

## 2018-07-13 NOTE — Telephone Encounter (Signed)
Per referral for left hip pain by patient's primary care, Dr Legrand Rams, said is a new patient there as well, please review Xray of left hip/pelvis. Patient recently moved back to this area; said over the past 20 years, has had back fractures x3, epidural steroid injection x1, also had neck surgery, and states has herniated disc. States has been unable to obtain any past records as of yet. Please advise if ok to schedule for hip evaluation.

## 2018-07-16 DIAGNOSIS — Z7901 Long term (current) use of anticoagulants: Secondary | ICD-10-CM | POA: Diagnosis not present

## 2018-07-16 DIAGNOSIS — I480 Paroxysmal atrial fibrillation: Secondary | ICD-10-CM | POA: Diagnosis not present

## 2018-07-16 NOTE — Telephone Encounter (Signed)
Called back to patient; scheduled as noted; patient and referring provider's office aware.

## 2018-07-16 NOTE — Telephone Encounter (Signed)
March 23

## 2018-07-17 ENCOUNTER — Telehealth: Payer: Self-pay | Admitting: Gastroenterology

## 2018-07-17 NOTE — Telephone Encounter (Signed)
Noted  

## 2018-07-17 NOTE — Telephone Encounter (Signed)
Haley Patton, patient was referred as a new patient to [place] from the ED. She established care with Dr. Olevia Perches practice 05/10/18. Please cancel appt and needs to stay with Dr. Laural Golden. Routing to [place] just as FYI.

## 2018-07-17 NOTE — Telephone Encounter (Signed)
CANCELLED APPOINTMENT

## 2018-07-19 ENCOUNTER — Ambulatory Visit: Payer: Medicare Other | Admitting: Gastroenterology

## 2018-07-20 DIAGNOSIS — J0100 Acute maxillary sinusitis, unspecified: Secondary | ICD-10-CM | POA: Diagnosis not present

## 2018-07-24 ENCOUNTER — Ambulatory Visit (HOSPITAL_COMMUNITY): Payer: Medicare Other | Admitting: Licensed Clinical Social Worker
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
## 2018-07-31 ENCOUNTER — Ambulatory Visit: Payer: Self-pay | Admitting: *Deleted

## 2018-08-02 ENCOUNTER — Other Ambulatory Visit: Payer: Self-pay

## 2018-08-02 ENCOUNTER — Encounter (HOSPITAL_COMMUNITY): Payer: Self-pay | Admitting: Psychiatry

## 2018-08-02 ENCOUNTER — Ambulatory Visit (INDEPENDENT_AMBULATORY_CARE_PROVIDER_SITE_OTHER): Payer: Medicare Other | Admitting: Psychiatry

## 2018-08-02 VITALS — BP 115/77 | HR 76 | Ht 64.0 in | Wt 228.0 lb

## 2018-08-02 DIAGNOSIS — Z79899 Other long term (current) drug therapy: Secondary | ICD-10-CM

## 2018-08-02 DIAGNOSIS — F4310 Post-traumatic stress disorder, unspecified: Secondary | ICD-10-CM | POA: Diagnosis not present

## 2018-08-02 DIAGNOSIS — Z915 Personal history of self-harm: Secondary | ICD-10-CM | POA: Diagnosis not present

## 2018-08-02 DIAGNOSIS — F0630 Mood disorder due to known physiological condition, unspecified: Secondary | ICD-10-CM | POA: Diagnosis not present

## 2018-08-02 MED ORDER — LAMOTRIGINE 25 MG PO TABS: ORAL_TABLET | ORAL | 1 refills | Status: DC

## 2018-08-02 NOTE — Patient Instructions (Signed)
1. Discontinue Latuda (used to be on 20 mg) 2. Start lamotrigine 25 mg daily for two weeks, then 50 mg daily  3. Check TSH 4. Return to clinic in two months for 15 mins

## 2018-08-06 ENCOUNTER — Ambulatory Visit: Payer: Medicare Other | Admitting: Orthopedic Surgery

## 2018-08-14 DIAGNOSIS — Z7901 Long term (current) use of anticoagulants: Secondary | ICD-10-CM | POA: Diagnosis not present

## 2018-08-14 DIAGNOSIS — I480 Paroxysmal atrial fibrillation: Secondary | ICD-10-CM | POA: Diagnosis not present

## 2018-08-20 ENCOUNTER — Other Ambulatory Visit (HOSPITAL_COMMUNITY): Payer: Self-pay | Admitting: Psychiatry

## 2018-08-20 ENCOUNTER — Telehealth (HOSPITAL_COMMUNITY): Payer: Self-pay | Admitting: *Deleted

## 2018-08-20 MED ORDER — TRAZODONE HCL 50 MG PO TABS: ORAL_TABLET | ORAL | 1 refills | Status: DC

## 2018-08-20 NOTE — Telephone Encounter (Signed)
Ask her if she would like to try Trazodone. Will order it to the pharmacy if she is interested.

## 2018-08-20 NOTE — Telephone Encounter (Signed)
Ptient informed per Dr Modesta Messing : Ordered trazodone, 25-50 mg at night as needed for sleep. Advise her to try lower dose (25 mg) first to avoid oversedation

## 2018-08-20 NOTE — Telephone Encounter (Signed)
Dr Modesta Messing Patient called " stating that on last visit she did mention to you that she isn't getting any sleep". And now with everything going on in the world &  her PTSD & Bipolar she isn't sleeping & asked if she could get something that would help her sleep? Net visit is 10-07-2018. # [phone]

## 2018-08-20 NOTE — Telephone Encounter (Signed)
Dr Alvino Chapel with patient & she is willing to try the Trazodone.

## 2018-08-20 NOTE — Telephone Encounter (Signed)
Ordered trazodone, 25-50 mg at night as needed for sleep. Advise her to try lower dose (25 mg) first to avoid oversedation.

## 2018-08-21 DIAGNOSIS — J449 Chronic obstructive pulmonary disease, unspecified: Secondary | ICD-10-CM | POA: Diagnosis not present

## 2018-08-21 DIAGNOSIS — J069 Acute upper respiratory infection, unspecified: Secondary | ICD-10-CM | POA: Diagnosis not present

## 2018-08-27 ENCOUNTER — Ambulatory Visit: Payer: Medicare Other | Admitting: Orthopedic Surgery

## 2018-08-27 ENCOUNTER — Other Ambulatory Visit: Payer: Self-pay

## 2018-08-28 ENCOUNTER — Other Ambulatory Visit: Payer: Self-pay

## 2018-08-28 ENCOUNTER — Emergency Department (HOSPITAL_COMMUNITY): Payer: Medicare Other

## 2018-08-28 ENCOUNTER — Emergency Department (HOSPITAL_COMMUNITY)
Admission: EM | Admit: 2018-08-28 | Discharge: 2018-08-28 | Disposition: A | Discharge status: Home / Self Care | Payer: Medicare Other | Attending: Emergency Medicine | Admitting: Emergency Medicine

## 2018-08-28 ENCOUNTER — Encounter (HOSPITAL_COMMUNITY): Payer: Self-pay | Admitting: Emergency Medicine

## 2018-08-28 DIAGNOSIS — F17210 Nicotine dependence, cigarettes, uncomplicated: Secondary | ICD-10-CM | POA: Diagnosis not present

## 2018-08-28 DIAGNOSIS — G8929 Other chronic pain: Secondary | ICD-10-CM | POA: Insufficient documentation

## 2018-08-28 DIAGNOSIS — I110 Hypertensive heart disease with heart failure: Secondary | ICD-10-CM | POA: Insufficient documentation

## 2018-08-28 DIAGNOSIS — Z7982 Long term (current) use of aspirin: Secondary | ICD-10-CM | POA: Insufficient documentation

## 2018-08-28 DIAGNOSIS — Z79899 Other long term (current) drug therapy: Secondary | ICD-10-CM | POA: Diagnosis not present

## 2018-08-28 DIAGNOSIS — R0789 Other chest pain: Secondary | ICD-10-CM | POA: Diagnosis not present

## 2018-08-28 DIAGNOSIS — R109 Unspecified abdominal pain: Secondary | ICD-10-CM | POA: Insufficient documentation

## 2018-08-28 DIAGNOSIS — I509 Heart failure, unspecified: Secondary | ICD-10-CM | POA: Insufficient documentation

## 2018-08-28 HISTORY — DX: Flank pain, left side: R10.A2

## 2018-08-28 HISTORY — DX: Other chronic pain: G89.29

## 2018-08-28 HISTORY — DX: Unspecified abdominal pain: R10.9

## 2018-08-28 LAB — URINALYSIS, ROUTINE W REFLEX MICROSCOPIC
Bilirubin Urine: NEGATIVE
Glucose, UA: NEGATIVE mg/dL
Ketones, ur: NEGATIVE mg/dL
Leukocytes,Ua: NEGATIVE
Nitrite: NEGATIVE
Protein, ur: NEGATIVE mg/dL
Specific Gravity, Urine: 1.016 (ref 1.005–1.030)
pH: 5 (ref 5.0–8.0)

## 2018-08-28 MED ORDER — DICYCLOMINE HCL 10 MG/ML IM SOLN
20.0000 mg | Freq: Once | INTRAMUSCULAR | Status: AC
  Administered 2018-08-28: 20 mg via INTRAMUSCULAR
  Filled 2018-08-28: qty 2

## 2018-08-28 MED ORDER — METHOCARBAMOL 500 MG PO TABS: 1000.0000 mg | ORAL_TABLET | Freq: Four times a day (QID) | ORAL | 0 refills | Status: DC | PRN

## 2018-08-28 NOTE — ED Provider Notes (Signed)
[facility] EMERGENCY DEPARTMENT Provider Note   CSN: [id number] Arrival date & time: 08/28/18  1834    History   Chief Complaint Chief Complaint  Patient presents with   Flank Pain    HPI Haley Patton is a 53 y.o. female.     HPI  Pt was seen at 1850. Per pt, c/o sudden onset and persistence of multiple intermittent episodes of left sided flank "pain" for the past 1 year, constant over the past 2 days.  Pt describes the pain as "aching," "bloating," "cramping" and radiating into the left side of her abd. Denies any change in her usual chronic pain pattern.  Has been associated with no other symptoms. States her PMD has been treating her "with pain medications." States she does not have definitive dx for her chronic/recurrent pain other than "maybe it's my kidney."  Pt states she ran out of pain meds (tramadol) and was called in mobic by her PMD. States she "can't take this because I'm on coumadin." Denies dysuria/hematuria, no abd pain, no N/V, no diarrhea, no black or blood in stools, no CP/palpitations, no cough/SOB, no fevers, no rash. The symptoms have been associated with no other complaints. The patient has a significant history of similar symptoms previously, recently being evaluated for this complaint and multiple prior evals for same.      Past Medical History:  Diagnosis Date   Atrial fibrillation ([place])    Bipolar 1 disorder ([facility])    CHF (congestive heart failure) ([facility])    Chronic left flank pain    COPD (chronic obstructive pulmonary disease) ([facility])    DJD (degenerative joint disease)    High cholesterol    PTSD (post-traumatic stress disorder)     Patient Active Problem List   Diagnosis Date Noted   Mood disorder in conditions classified elsewhere 06/29/2018   PTSD (post-traumatic stress disorder) 06/29/2018   LUQ pain 05/10/2018   Flank pain 05/10/2018    Past Surgical History:  Procedure Laterality Date   ABDOMINAL HYSTERECTOMY      CERVICAL SPINE SURGERY     VALVE REPLACEMENT       OB History   No obstetric history on file.      Home Medications    Prior to Admission medications   Medication Sig Start Date End Date Taking? Authorizing Provider  aspirin EC 81 MG tablet Take 81 mg by mouth daily.    [provider]  dicyclomine (BENTYL) 20 MG tablet Take 1 every 8 hours as needed for abdominal cramping 04/26/18   Milton Ferguson, MD  furosemide (LASIX) 40 MG tablet Take 40 mg by mouth daily.    [provider]  Ipratropium-Albuterol (COMBIVENT RESPIMAT) 20-100 MCG/ACT AERS respimat Inhale 1 puff into the lungs every 6 (six) hours.    [provider]  lamoTRIgine (LAMICTAL) 25 MG tablet 25 mg daily for two weeks, then 50 mg daily 08/02/18   [name], MD  metoprolol tartrate (LOPRESSOR) 25 MG tablet Take 0.5 tablets (12.5 mg total) by mouth 2 (two) times daily. 05/28/18 08/26/18  Arnoldo Lenis, MD  montelukast (SINGULAIR) 10 MG tablet Take 10 mg by mouth at bedtime.    [provider]  omeprazole (PRILOSEC) 40 MG capsule Take 40 mg by mouth daily.    [provider]  pravastatin (PRAVACHOL) 40 MG tablet Take 40 mg by mouth daily.    [provider]  pregabalin (LYRICA) 150 MG capsule Take 150 mg by mouth 3 (three) times  daily.    [provider]  traZODone (DESYREL) 50 MG tablet 25-50 mg at night as needed for sleep 08/20/18   [name], MD  warfarin (COUMADIN) 2.5 MG tablet Take 2.5 mg by mouth daily. Takes 5mg  daily.    [provider]    Family History Family History  Problem Relation Age of Onset   Bipolar disorder Mother     Social History Social History   Tobacco Use   Smoking status: Current Every Day Smoker    Packs/day: 1.50    Types: Cigarettes   Smokeless tobacco: Never Used  Substance Use Topics   Alcohol use: Never    Frequency: Never   Drug use: Never     Allergies   Lithium and Tape   Review of  Systems Review of Systems ROS: Statement: All systems negative except as marked or noted in the HPI; Constitutional: Negative for fever and chills. ; ; Eyes: Negative for eye pain, redness and discharge. ; ; ENMT: Negative for ear pain, hoarseness, nasal congestion, sinus pressure and sore throat. ; ; Cardiovascular: Negative for chest pain, palpitations, diaphoresis, dyspnea and peripheral edema. ; ; Respiratory: Negative for cough, wheezing and stridor. ; ; Gastrointestinal: Negative for nausea, vomiting, diarrhea, abdominal pain, blood in stool, hematemesis, jaundice and rectal bleeding. . ; ; Genitourinary: +flank pain. Negative for dysuria and hematuria. ; ; Musculoskeletal: Negative for back pain and neck pain. Negative for swelling and trauma.; ; Skin: Negative for pruritus, rash, abrasions, blisters, bruising and skin lesion.; ; Neuro: Negative for headache, lightheadedness and neck stiffness. Negative for weakness, altered level of consciousness, altered mental status, extremity weakness, paresthesias, involuntary movement, seizure and syncope.      Physical Exam Updated Vital Signs BP 137/81 (BP Location: Right Arm)    Pulse 85    Temp 98.3 F (36.8 C) (Oral)    Resp 18    Ht 5\' 4"  (1.626 m)    Wt 101.6 kg    SpO2 99%    BMI 38.45 kg/m   Physical Exam 1855: Physical examination:  Nursing notes reviewed; Vital signs and O2 SAT reviewed;  Constitutional: Well developed, Well nourished, Well hydrated, In no acute distress; Head:  Normocephalic, atraumatic; Eyes: EOMI, PERRL, No scleral icterus; ENMT: Mouth and pharynx normal, Mucous membranes moist; Neck: Supple, Full range of motion, No lymphadenopathy; Cardiovascular: Regular rate and rhythm, No gallop; Respiratory: Breath sounds clear & equal bilaterally, No wheezes.  Speaking full sentences with ease, Normal respiratory effort/excursion; Chest: Nontender, Movement normal; Abdomen: Soft, Nontender, Nondistended, Normal bowel sounds;  Genitourinary: No CVA tenderness; Spine:  No midline CS, TS, LS tenderness. +TTP left lumbar paraspinal muscles. No rash, no ecchymosis.;; Extremities: Peripheral pulses normal, No tenderness, No edema, No calf edema or asymmetry.; Neuro: AA&Ox3, Major CN grossly intact.  Speech clear. No gross focal motor or sensory deficits in extremities.; Skin: Color normal, Warm, Dry.   ED Treatments / Results  Labs (all labs ordered are listed, but only abnormal results are displayed)   EKG None  Radiology   Procedures Procedures (including critical care time)  Medications Ordered in ED Medications - No data to display   Initial Impression / Assessment and Plan / ED Course  I have reviewed the triage vital signs and the nursing notes.  Pertinent labs & imaging results that were available during my care of the patient were reviewed by me and considered in my medical decision making (see chart for details).     MDM  Reviewed: previous chart, nursing note and vitals Reviewed previous: labs Interpretation: labs and CT scan    Results for orders placed or performed during the hospital encounter of 08/28/18  Urinalysis, Routine w reflex microscopic  Result Value Ref Range   Color, Urine YELLOW YELLOW   APPearance CLEAR CLEAR   Specific Gravity, Urine 1.016 1.005 - 1.030   pH 5.0 5.0 - 8.0   Glucose, UA NEGATIVE NEGATIVE mg/dL   Hgb urine dipstick SMALL (A) NEGATIVE   Bilirubin Urine NEGATIVE NEGATIVE   Ketones, ur NEGATIVE NEGATIVE mg/dL   Protein, ur NEGATIVE NEGATIVE mg/dL   Nitrite NEGATIVE NEGATIVE   Leukocytes,Ua NEGATIVE NEGATIVE   RBC / HPF 0-5 0 - 5 RBC/hpf   WBC, UA 0-5 0 - 5 WBC/hpf   Bacteria, UA RARE (A) NONE SEEN   Squamous Epithelial / LPF 0-5 0 - 5   Ct Renal Stone Study Result Date: 08/28/2018 CLINICAL DATA:  Left flank pain for 2 days EXAM: CT ABDOMEN AND PELVIS WITHOUT CONTRAST TECHNIQUE: Multidetector CT imaging of the abdomen and pelvis was performed following  the standard protocol without IV contrast. COMPARISON:  04/26/2018 FINDINGS: LOWER CHEST: There is no basilar pleural or apical pericardial effusion. HEPATOBILIARY: The hepatic contours and density are normal. There is no intra- or extrahepatic biliary dilatation. The gallbladder is normal. PANCREAS: The pancreatic parenchymal contours are normal and there is no ductal dilatation. There is no peripancreatic fluid collection. SPLEEN: Normal. ADRENALS/URINARY TRACT: --Adrenal glands: Normal. --Right kidney/ureter: No hydronephrosis, nephroureterolithiasis, perinephric stranding or solid renal mass. --Left kidney/ureter: No hydronephrosis, nephroureterolithiasis, perinephric stranding or solid renal mass. --Urinary bladder: Normal for degree of distention STOMACH/BOWEL: --Stomach/Duodenum: There is no hiatal hernia or other gastric abnormality. The duodenal course and caliber are normal. --Small bowel: No dilatation or inflammation. --Colon: No focal abnormality. --Appendix: Surgically absent. VASCULAR/LYMPHATIC: There is aortic atherosclerosis without hemodynamically significant stenosis. No abdominal or pelvic lymphadenopathy. REPRODUCTIVE: Status post hysterectomy. No adnexal mass. MUSCULOSKELETAL. No bony spinal canal stenosis or focal osseous abnormality. OTHER: None. IMPRESSION: No acute abnormality of the abdomen or pelvis. Aortic atherosclerosis (ICD10-I70.0). Electronically Signed   By: Ulyses Jarred M.D.   On: 08/28/2018 19:25     Encounter Details Date Type Department Care Team Description  08/28/2018 Anti-coag visit [facility])  [address]  [place], [place] [address]  [phone]      Lab Results - documented in this encounter Protime-INR (08/27/2018) Protime-INR (08/27/2018)  Component Value Ref Range Performed At Pathologist Signature  INR 3.0 (A) 0.9 - 1.1     Protime-INR (08/27/2018)  Specimen  Blood    1954:   Workup reassuring. No clear cause for pt's chronic pain. Tx symptomatically, f/u PMD and GI MD. Dx and testing d/w pt.  Questions answered.  Verb understanding, agreeable to d/c home with outpt f/u.       Final Clinical Impressions(s) / ED Diagnoses   Final diagnoses:  None    ED Discharge Orders    None       Francine Graven, DO 08/31/18 1552

## 2018-08-28 NOTE — Discharge Instructions (Signed)
Take the prescription as directed.  Apply moist heat or ice to the area(s) of discomfort, for 15 minutes at a time, several times per day for the next few days.  Do not fall asleep on a heating or ice pack.  Call your regular medical doctor tomorrow to schedule a follow up appointment this week.  Call your GI doctor tomorrow to schedule a follow up appointment within the next week. Return to the Emergency Department immediately if worsening.

## 2018-08-28 NOTE — ED Triage Notes (Addendum)
Patient complaining of left flank pain x 2 days. States she has history of same and was treated by PCP. States she was given tramadol but is now out of the medication. States "Dr Legrand Rams called me in mobic today but he knows I can't take that because I'm on warfarin."

## 2018-08-29 ENCOUNTER — Telehealth (HOSPITAL_COMMUNITY): Payer: Self-pay | Admitting: *Deleted

## 2018-08-29 NOTE — Telephone Encounter (Signed)
Advise her to discontinue Trazodone. Will not plan to try other sleep aids at this time.

## 2018-08-29 NOTE — Telephone Encounter (Signed)
Dr Modesta Messing Patient called asking if she could STOP taking the Trazodone due to it's making her feel more depressed & she isn't sleeping well even trying the 25 mg & the 50 mg. It's like as if she took a "downer".

## 2018-08-29 NOTE — Telephone Encounter (Signed)
Patient called back stating sh had to go the ER yesterday for side pain & the doctor prescribed her Mobic.  She asked if that would have that kind of effect on her?

## 2018-08-29 NOTE — Telephone Encounter (Signed)
Patient stated she was previously on Buspirone HCL 10 mg PRN for Anxiety  Up to 3 a day.  Patient states that it really helped her sleep & could she try that medication again along with what she's already on?

## 2018-08-29 NOTE — Telephone Encounter (Signed)
Not at this time.

## 2018-08-29 NOTE — Telephone Encounter (Signed)
That is one possibility. One option is to discontinue Trazodone to see if it helps, and if not, consider limited use of Mobic, although I would recommend she would consult her physician about it.

## 2018-08-30 LAB — URINE CULTURE

## 2018-09-03 ENCOUNTER — Encounter (INDEPENDENT_AMBULATORY_CARE_PROVIDER_SITE_OTHER): Payer: Self-pay | Admitting: Internal Medicine

## 2018-09-03 ENCOUNTER — Other Ambulatory Visit: Payer: Self-pay

## 2018-09-03 ENCOUNTER — Ambulatory Visit (INDEPENDENT_AMBULATORY_CARE_PROVIDER_SITE_OTHER): Payer: Medicare Other | Admitting: Internal Medicine

## 2018-09-03 DIAGNOSIS — R109 Unspecified abdominal pain: Secondary | ICD-10-CM | POA: Diagnosis not present

## 2018-09-03 DIAGNOSIS — R1012 Left upper quadrant pain: Secondary | ICD-10-CM

## 2018-09-03 MED ORDER — LIDOCAINE 5 % EX PTCH: 1.0000 | MEDICATED_PATCH | CUTANEOUS | 0 refills | Status: DC

## 2018-09-03 NOTE — Patient Instructions (Signed)
Labs today. Try Lidocaine patch to LUQ. OV in 2 months.

## 2018-09-03 NOTE — Progress Notes (Signed)
Subjective:    Patient ID: Haley Patton, female    DOB: [birth year], 53 y.o.   MRN: [id number] Time [id number]. Total time 20 minutes.  PCP Dr. Legrand Rams LUQ pain and left flank pain.  HPI This is a telephone office visit. Patient is agreeable to see me. She is at home. I am in the office.  This is a telephone OV due to the Covid-19. Patient is unable to do video visit.   She underwent a colonoscopy in October of 2019 by Dr. Anthony Sar for LUQ pain. She did have hyperplastic polyps (In Care Everywhere). She has been seen multiple times in the ED due to this pain. Labs have all been normal. Has had this pain for about a year.  Her work up so far has been negative. No injury. She tells me she is still having the pain. She was told she had a lot of stool in her colon in the ED and took a laxative. The pain was not relieved but not as intense. She does take Metamucil once a week for her bowel. Her appetite is okay for the most part. No weight loss. The pain is located in her flank or her LUQ.  She denies melena or BRRB.  She says after she eats, sometimes she may have increased pain.  No Nausea or vomiting. Denies fever.   Hx of heart valve replacement and hx of atrial fib, CHF and maintained on Warfarin. 04/26/2018 CT abdomen/pelvis with CM: abdominal pain: LUQ IMPRESSION: 1. No acute findings. 2. Scattered sigmoid diverticula. 3. Aortoiliac  atherosclerosis (ICD10-170.0).  08/29/2018 US Renal Stone Study: LUQ pain:  IMPRESSION: No acute abnormality of the abdomen or pelvis.  Aortic atherosclerosis (ICD10-I70.0).   CBC    Component Value Date/Time   WBC 7.9 05/10/2018 1525   RBC 5.11 (H) 05/10/2018 1525   HGB 14.5 05/10/2018 1525   HCT 43.3 05/10/2018 1525   PLT 312 05/10/2018 1525   MCV 84.7 05/10/2018 1525   MCH 28.4 05/10/2018 1525   MCHC 33.5 05/10/2018 1525   RDW 14.5 05/10/2018 1525   LYMPHSABS 3,042 05/10/2018 1525   MONOABS 0.4 04/26/2018 1007   EOSABS 142  05/10/2018 1525   BASOSABS 47 05/10/2018 1525   Hepatic Function Latest Ref Rng & Units 05/10/2018 04/26/2018  Total Protein 6.1 - 8.1 g/dL 7.2 8.0  Albumin 3.5 - 5.0 g/dL - 4.3  AST 10 - 35 U/L 17 20  ALT 6 - 29 U/L 11 17  Alk Phosphatase 38 - 126 U/L - 63  Total Bilirubin 0.2 - 1.2 mg/dL 0.5 0.9   04/26/2018 Lipase: 2.5    Review of Systems Past Medical History:  Diagnosis Date  . Atrial fibrillation ([place])   . Bipolar 1 disorder ([place])   . CHF (congestive heart failure) ([place])   . Chronic left flank pain   . COPD (chronic obstructive pulmonary disease) ([place])   . DJD (degenerative joint disease)   . High cholesterol   . PTSD (post-traumatic stress disorder)     Past Surgical History:  Procedure Laterality Date  . ABDOMINAL HYSTERECTOMY    . CERVICAL SPINE SURGERY    . VALVE REPLACEMENT      Allergies  Allergen Reactions  . Lithium     Vomiting   . Tape Other (See Comments)    blisters    Current Outpatient Medications on File Prior to Visit  Medication Sig Dispense Refill  . aspirin EC 81 MG tablet Take 81 mg by mouth  daily.    . dicyclomine (BENTYL) 20 MG tablet Take 1 every 8 hours as needed for abdominal cramping 20 tablet 0  . furosemide (LASIX) 40 MG tablet Take 40 mg by mouth daily.    . Ipratropium-Albuterol (COMBIVENT RESPIMAT) 20-100 MCG/ACT AERS respimat Inhale 1 puff into the lungs every 6 (six) hours.    Marland Kitchen lamoTRIgine (LAMICTAL) 25 MG tablet Take 25 mg by mouth 2 (two) times daily.    . metoprolol succinate (TOPROL-XL) 25 MG 24 hr tablet Take 12.5 mg by mouth 2 (two) times a day.    Marland Kitchen omeprazole (PRILOSEC) 40 MG capsule Take 40 mg by mouth daily.    . pravastatin (PRAVACHOL) 40 MG tablet Take 40 mg by mouth daily.    . pregabalin (LYRICA) 150 MG capsule Take 150 mg by mouth 3 (three) times daily.    Marland Kitchen warfarin (COUMADIN) 2.5 MG tablet Take 2.5 mg by mouth daily. Takes 71m daily.    . metoprolol tartrate (LOPRESSOR) 25 MG tablet Take 0.5 tablets (12.5  mg total) by mouth 2 (two) times daily. 90 tablet 3   No current facility-administered medications on file prior to visit.         Objective:   Physical Exam Deferred        Assessment & Plan:  Left flank and LUQ pain. Will get a CBC, hepatic panel, amylase, and an H. Pylori, sedrate, H. pylori  Try the Lidoderm patch.  OV in 8 weeks.

## 2018-09-04 ENCOUNTER — Other Ambulatory Visit (HOSPITAL_COMMUNITY)
Admission: RE | Admit: 2018-09-04 | Discharge: 2018-09-04 | Disposition: A | Discharge status: Home / Self Care | Payer: Medicare Other | Source: Ambulatory Visit | Attending: Internal Medicine | Admitting: Internal Medicine

## 2018-09-04 ENCOUNTER — Other Ambulatory Visit: Payer: Self-pay

## 2018-09-04 DIAGNOSIS — R1012 Left upper quadrant pain: Secondary | ICD-10-CM | POA: Insufficient documentation

## 2018-09-04 LAB — HEPATIC FUNCTION PANEL
ALT: 14 U/L (ref 0–44)
AST: 16 U/L (ref 15–41)
Albumin: 3.9 g/dL (ref 3.5–5.0)
Alkaline Phosphatase: 60 U/L (ref 38–126)
Bilirubin, Direct: 0.1 mg/dL (ref 0.0–0.2)
Indirect Bilirubin: 0.4 mg/dL (ref 0.3–0.9)
Total Bilirubin: 0.5 mg/dL (ref 0.3–1.2)
Total Protein: 7 g/dL (ref 6.5–8.1)

## 2018-09-04 LAB — CBC WITH DIFFERENTIAL/PLATELET
Abs Immature Granulocytes: 0.01 10*3/uL (ref 0.00–0.07)
Basophils Absolute: 0.1 10*3/uL (ref 0.0–0.1)
Basophils Relative: 1 %
Eosinophils Absolute: 0.1 10*3/uL (ref 0.0–0.5)
Eosinophils Relative: 2 %
HCT: 44 % (ref 36.0–46.0)
Hemoglobin: 14.2 g/dL (ref 12.0–15.0)
Immature Granulocytes: 0 %
Lymphocytes Relative: 41 %
Lymphs Abs: 2.7 10*3/uL (ref 0.7–4.0)
MCH: 28.6 pg (ref 26.0–34.0)
MCHC: 32.3 g/dL (ref 30.0–36.0)
MCV: 88.5 fL (ref 80.0–100.0)
Monocytes Absolute: 0.4 10*3/uL (ref 0.1–1.0)
Monocytes Relative: 6 %
Neutro Abs: 3.3 10*3/uL (ref 1.7–7.7)
Neutrophils Relative %: 50 %
Platelets: 277 10*3/uL (ref 150–400)
RBC: 4.97 MIL/uL (ref 3.87–5.11)
RDW: 15 % (ref 11.5–15.5)
WBC: 6.7 10*3/uL (ref 4.0–10.5)
nRBC: 0 % (ref 0.0–0.2)

## 2018-09-04 LAB — AMYLASE: Amylase: 41 U/L (ref 28–100)

## 2018-09-04 LAB — SEDIMENTATION RATE: Sed Rate: 11 mm/hr (ref 0–22)

## 2018-09-05 LAB — H. PYLORI ANTIBODY, IGG: H Pylori IgG: 0.14 Index Value (ref 0.00–0.79)

## 2018-09-11 DIAGNOSIS — Z7901 Long term (current) use of anticoagulants: Secondary | ICD-10-CM | POA: Diagnosis not present

## 2018-09-11 DIAGNOSIS — I480 Paroxysmal atrial fibrillation: Secondary | ICD-10-CM | POA: Diagnosis not present

## 2018-09-14 DIAGNOSIS — M539 Dorsopathy, unspecified: Secondary | ICD-10-CM | POA: Diagnosis not present

## 2018-09-14 DIAGNOSIS — I2510 Atherosclerotic heart disease of native coronary artery without angina pectoris: Secondary | ICD-10-CM | POA: Diagnosis not present

## 2018-09-14 DIAGNOSIS — I482 Chronic atrial fibrillation, unspecified: Secondary | ICD-10-CM | POA: Diagnosis not present

## 2018-09-20 ENCOUNTER — Ambulatory Visit (HOSPITAL_COMMUNITY): Payer: Medicare Other | Admitting: Licensed Clinical Social Worker

## 2018-09-20 ENCOUNTER — Other Ambulatory Visit: Payer: Self-pay
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
## 2018-10-02 ENCOUNTER — Ambulatory Visit (HOSPITAL_COMMUNITY): Payer: Medicare Other | Admitting: Psychiatry

## 2018-10-02 ENCOUNTER — Other Ambulatory Visit: Payer: Self-pay

## 2018-10-04 ENCOUNTER — Other Ambulatory Visit (HOSPITAL_COMMUNITY): Payer: Self-pay | Admitting: Psychiatry

## 2018-10-04 ENCOUNTER — Telehealth (HOSPITAL_COMMUNITY): Payer: Self-pay | Admitting: *Deleted

## 2018-10-04 MED ORDER — LAMOTRIGINE 25 MG PO TABS: 50.0000 mg | ORAL_TABLET | Freq: Every day | ORAL | 0 refills | Status: DC

## 2018-10-04 NOTE — Telephone Encounter (Signed)
Dr Modesta Messing Patient was transferred to me after scheduling appointment with front office to notify that she has 14 pills left of her Lamictal & her visit is on 10/16/2018.

## 2018-10-04 NOTE — Telephone Encounter (Signed)
Ordered (lamotrigine 50 mg daily; take 2 of 25 mg tabs). Please discuss attendance policy as well.

## 2018-10-04 NOTE — Telephone Encounter (Signed)
Spoke with patient per provider:Ordered (lamotrigine 50 mg daily; take 2 of 25 mg tabs). Reminded attendance policy as well

## 2018-10-09 DIAGNOSIS — I480 Paroxysmal atrial fibrillation: Secondary | ICD-10-CM | POA: Diagnosis not present

## 2018-10-09 DIAGNOSIS — Z7901 Long term (current) use of anticoagulants: Secondary | ICD-10-CM | POA: Diagnosis not present
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
## 2018-10-15 ENCOUNTER — Other Ambulatory Visit: Payer: Self-pay

## 2018-10-15 ENCOUNTER — Ambulatory Visit (HOSPITAL_COMMUNITY)
Admission: RE | Admit: 2018-10-15 | Discharge: 2018-10-15 | Disposition: A | Discharge status: Home / Self Care | Payer: Medicare Other | Source: Ambulatory Visit | Attending: Internal Medicine | Admitting: Internal Medicine

## 2018-10-15 ENCOUNTER — Other Ambulatory Visit (HOSPITAL_COMMUNITY): Payer: Self-pay | Admitting: Internal Medicine

## 2018-10-15 DIAGNOSIS — M545 Low back pain, unspecified: Secondary | ICD-10-CM

## 2018-10-15 DIAGNOSIS — M549 Dorsalgia, unspecified: Secondary | ICD-10-CM | POA: Diagnosis not present

## 2018-10-15 DIAGNOSIS — M5384 Other specified dorsopathies, thoracic region: Secondary | ICD-10-CM | POA: Diagnosis not present

## 2018-10-15 DIAGNOSIS — N390 Urinary tract infection, site not specified: Secondary | ICD-10-CM | POA: Diagnosis not present

## 2018-10-16 ENCOUNTER — Ambulatory Visit (INDEPENDENT_AMBULATORY_CARE_PROVIDER_SITE_OTHER): Payer: Medicare Other | Admitting: Psychiatry

## 2018-10-16 ENCOUNTER — Encounter (HOSPITAL_COMMUNITY): Payer: Self-pay | Admitting: Psychiatry

## 2018-10-16 DIAGNOSIS — F0630 Mood disorder due to known physiological condition, unspecified: Secondary | ICD-10-CM | POA: Diagnosis not present

## 2018-10-16 DIAGNOSIS — F4310 Post-traumatic stress disorder, unspecified: Secondary | ICD-10-CM

## 2018-10-16 MED ORDER — LAMOTRIGINE 100 MG PO TABS: 100.0000 mg | ORAL_TABLET | Freq: Every day | ORAL | 1 refills | Status: DC

## 2018-10-16 NOTE — Patient Instructions (Signed)
1. Increase lamotrigine 100 mg daily  2. Next appointment: 7/31 at 8:40

## 2018-10-17 ENCOUNTER — Ambulatory Visit (INDEPENDENT_AMBULATORY_CARE_PROVIDER_SITE_OTHER): Payer: Medicare Other | Admitting: Licensed Clinical Social Worker

## 2018-10-17 ENCOUNTER — Other Ambulatory Visit: Payer: Self-pay | Admitting: Internal Medicine

## 2018-10-17 ENCOUNTER — Other Ambulatory Visit: Payer: Self-pay

## 2018-10-17 ENCOUNTER — Encounter (HOSPITAL_COMMUNITY): Payer: Self-pay | Admitting: Licensed Clinical Social Worker

## 2018-10-17 DIAGNOSIS — F0630 Mood disorder due to known physiological condition, unspecified: Secondary | ICD-10-CM | POA: Diagnosis not present

## 2018-10-17 DIAGNOSIS — F4310 Post-traumatic stress disorder, unspecified: Secondary | ICD-10-CM

## 2018-10-17 DIAGNOSIS — R9389 Abnormal findings on diagnostic imaging of other specified body structures: Secondary | ICD-10-CM
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
## 2018-10-18 ENCOUNTER — Other Ambulatory Visit (HOSPITAL_COMMUNITY): Payer: Self-pay | Admitting: Psychiatry

## 2018-10-18 ENCOUNTER — Telehealth (HOSPITAL_COMMUNITY): Payer: Self-pay | Admitting: *Deleted

## 2018-10-18 MED ORDER — LORAZEPAM 1 MG PO TABS: 1.0000 mg | ORAL_TABLET | Freq: Once | ORAL | 0 refills | Status: AC

## 2018-10-18 NOTE — Telephone Encounter (Signed)
Patient said Thank You So Much

## 2018-10-18 NOTE — Telephone Encounter (Signed)
Dr Harrington Challenger Patient called stated " Dr Legrand Rams ordered her a X-ray due to her c/o severe back pain. X-ray  Now requires a  MRI to done. And patient is very nervous because [facility] MRI machine is a 'Closed MRI Machine'. She says with her Claustrophobia she can't do it & asked if you could prescribe something that  Would help her relax? MRI is 10/25/2018 @ [facility]

## 2018-10-18 NOTE — Telephone Encounter (Signed)
Dr Modesta Messing pt

## 2018-10-18 NOTE — Telephone Encounter (Signed)
Dr Modesta Messing Patient called stated " Dr Legrand Rams ordered her a X-ray due to her c/o severe back pain. X-ray Now requires a  MRI to done. And patient is very nervous because [facility] MRI machine is a 'Closed MRI Machine'.  She says with her Claustrophobia she can't do it & asked if you could prescribe something that Would help her relax? MRI is 10/25/2018 @ [facility]

## 2018-10-18 NOTE — Telephone Encounter (Signed)
I ordered ativan 1 mg. Advise her to take this 15 mins before taking MRI. Please advise her not to drive after taking this medication.

## 2018-10-25 ENCOUNTER — Other Ambulatory Visit: Payer: Self-pay

## 2018-10-25 ENCOUNTER — Encounter (HOSPITAL_COMMUNITY): Payer: Self-pay

## 2018-10-25 ENCOUNTER — Ambulatory Visit (HOSPITAL_COMMUNITY)
Admission: RE | Admit: 2018-10-25 | Discharge: 2018-10-25 | Disposition: A | Discharge status: Home / Self Care | Payer: Medicare Other | Source: Ambulatory Visit | Attending: Internal Medicine | Admitting: Internal Medicine

## 2018-10-25 DIAGNOSIS — R9389 Abnormal findings on diagnostic imaging of other specified body structures: Secondary | ICD-10-CM

## 2018-10-26 ENCOUNTER — Telehealth (HOSPITAL_COMMUNITY): Payer: Self-pay | Admitting: *Deleted

## 2018-10-26 ENCOUNTER — Other Ambulatory Visit (HOSPITAL_COMMUNITY): Payer: Self-pay | Admitting: Psychiatry

## 2018-10-26 MED ORDER — LORAZEPAM 1 MG PO TABS: ORAL_TABLET | ORAL | 0 refills | Status: DC

## 2018-10-26 NOTE — Telephone Encounter (Signed)
ordered

## 2018-10-26 NOTE — Telephone Encounter (Signed)
Dr Modesta Messing MRI schedule on  10/25/2018 was placed on hold after taking her Ativan. Place on hold" due to clip in her heart & needed to get clearance from Cardiologist". Next MRI scheduled for  10-31-2018 @ 11:00 am @ [place] patient asked for another Ativan for this MRI. Patient has been informed that this would be the last Ativan prescribed .

## 2018-10-31 ENCOUNTER — Ambulatory Visit (HOSPITAL_COMMUNITY)
Admission: RE | Admit: 2018-10-31 | Discharge: 2018-10-31 | Disposition: A | Discharge status: Home / Self Care | Payer: Medicare Other | Source: Ambulatory Visit | Attending: Internal Medicine | Admitting: Internal Medicine

## 2018-10-31 ENCOUNTER — Other Ambulatory Visit: Payer: Self-pay

## 2018-10-31 DIAGNOSIS — R9389 Abnormal findings on diagnostic imaging of other specified body structures: Secondary | ICD-10-CM | POA: Insufficient documentation

## 2018-10-31 DIAGNOSIS — M4854XA Collapsed vertebra, not elsewhere classified, thoracic region, initial encounter for fracture: Secondary | ICD-10-CM | POA: Diagnosis not present

## 2018-11-01 DIAGNOSIS — M539 Dorsopathy, unspecified: Secondary | ICD-10-CM | POA: Diagnosis not present

## 2018-11-01 DIAGNOSIS — M4854XS Collapsed vertebra, not elsewhere classified, thoracic region, sequela of fracture: Secondary | ICD-10-CM | POA: Diagnosis not present

## 2018-11-05 ENCOUNTER — Ambulatory Visit (INDEPENDENT_AMBULATORY_CARE_PROVIDER_SITE_OTHER): Payer: Self-pay | Admitting: Internal Medicine

## 2018-11-05 DIAGNOSIS — Z7901 Long term (current) use of anticoagulants: Secondary | ICD-10-CM | POA: Diagnosis not present

## 2018-11-05 DIAGNOSIS — I480 Paroxysmal atrial fibrillation: Secondary | ICD-10-CM | POA: Diagnosis not present

## 2018-11-07 DIAGNOSIS — M545 Low back pain: Secondary | ICD-10-CM | POA: Diagnosis not present

## 2018-11-07 DIAGNOSIS — M546 Pain in thoracic spine: Secondary | ICD-10-CM | POA: Diagnosis not present

## 2018-11-08 ENCOUNTER — Telehealth (HOSPITAL_COMMUNITY): Payer: Self-pay | Admitting: *Deleted

## 2018-11-08 NOTE — Telephone Encounter (Signed)
PATIENT INFORMED PER PROVIDER : Advise her to decrease lamotrigine back to 50 mg daily. Advise her to contact us if any worsening in rash even she does not scratch or any rash in her mouth

## 2018-11-08 NOTE — Telephone Encounter (Signed)
Advise her to decrease lamotrigine back to 50 mg daily. Advise her to contact us if any worsening in rash even she does not scratch or any rash in her mouth.
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
## 2018-11-09 DIAGNOSIS — M549 Dorsalgia, unspecified: Secondary | ICD-10-CM | POA: Diagnosis not present

## 2018-11-09 DIAGNOSIS — Z952 Presence of prosthetic heart valve: Secondary | ICD-10-CM | POA: Diagnosis not present

## 2018-11-13 ENCOUNTER — Other Ambulatory Visit (HOSPITAL_COMMUNITY): Payer: Self-pay | Admitting: Interventional Radiology

## 2018-11-13 DIAGNOSIS — S22060A Wedge compression fracture of T7-T8 vertebra, initial encounter for closed fracture: Secondary | ICD-10-CM

## 2018-11-14 ENCOUNTER — Telehealth (HOSPITAL_COMMUNITY): Payer: Self-pay
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
## 2018-11-14 NOTE — Telephone Encounter (Signed)
Noted, thanks!

## 2018-11-15 ENCOUNTER — Other Ambulatory Visit: Payer: Self-pay

## 2018-11-15 ENCOUNTER — Encounter (HOSPITAL_COMMUNITY): Payer: Self-pay | Admitting: Licensed Clinical Social Worker

## 2018-11-15 ENCOUNTER — Ambulatory Visit (INDEPENDENT_AMBULATORY_CARE_PROVIDER_SITE_OTHER): Payer: Medicaid Other | Admitting: Licensed Clinical Social Worker

## 2018-11-15 DIAGNOSIS — F4310 Post-traumatic stress disorder, unspecified: Secondary | ICD-10-CM

## 2018-11-15 NOTE — Progress Notes (Signed)
Virtual Visit via Telephone Note  I connected with Haley Patton on 11/15/18 at  9:00 AM EDT by telephone and verified that I am speaking with the correct person using two identifiers.  Location: Patient: Home Provider: Office   I discussed the limitations, risks, security and privacy concerns of performing an evaluation and management service by telephone and the availability of in person appointments. I also discussed with the patient that there may be a patient responsible charge related to this service. The patient expressed understanding and agreed to proceed.  Participation Level: Active  Behavioral Response: CasualAlertDepressed  Type of Therapy: Individual Therapy  Treatment Goals addressed: Coping  Interventions: CBT and Solution Focused  Summary: Haley Patton is a 53 y.o. female who presents oriented x5 (person, place, situation, time, and object), casually dressed, appropriately groomed, average height, average weight and cooperative to address PTSD. Patient has a history of medical treatment including COPD, degenerative joint disease, and heart attack. Patient has a history of mental health treatment including outpatient therapy, medication management, and hospitalization. Patient denies psychosis including auditory and visual hallucinations. Patient denies homicidal and suicidal ideations. Patient denies substance abuse but has a history of substance abuse. Patient is at low risk for lethality at this time.  Physically: Patient has been picking at her skin. She has engaged in this behavior since she was a teenager. Patient has a lot of chronic health issues that make it difficult for her to move around.  Spiritually/values: No issues identified.  Relationships: Patient is worried about her adult sons. They are substance abusers. She contacts them on a daily basis to check on them but it leaves her feeling hurt and frustrated. She has decided to limit how often she talks to  them. Patient has agreed to contact them 5 days a week instead of 7 and slowly reduce from there.  Emotionally/Mentally/Behavior: Patient has been anxious. After discussion, patient agreed to work on examining her thoughts to identify why she experiences the emotions that she experiences.   Patient engaged in session. She responded well to interventions. 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 Patient will continue in outpatient therapy due to being the least restrictive service to meet her needs. Patient made minimal progress on her goals at this time.   Suicidal/Homicidal: Negativewithout intent/plan  Therapist Response: Therapist reviewed patient's recent thoughts and behavior. Therapist utilized CBT to address mood and anxiety. Therapist processed patient's thoughts to identify triggers for mood and anxiety. Therapist discussed patient's health and relationship with children. Therapist completed treatment plan with patient.   Plan: Return again in 4 weeks.  Diagnosis: Axis I: Post Traumatic Stress Disorder    Axis II: No diagnosis  I discussed the assessment and treatment plan with the patient. The patient was provided an opportunity to ask questions and all were answered. The patient agreed with the plan and demonstrated an understanding of the instructions.   The patient was advised to call back or seek an in-person evaluation if the symptoms worsen or if the condition fails to improve as anticipated.  I provided 40 minutes of non-face-to-face time during this encounter.     Glori Bickers, LCSW 11/15/2018

## 2018-11-26 ENCOUNTER — Other Ambulatory Visit: Payer: Self-pay | Admitting: Student

## 2018-11-27 ENCOUNTER — Other Ambulatory Visit: Payer: Self-pay

## 2018-11-27 ENCOUNTER — Ambulatory Visit (HOSPITAL_COMMUNITY)
Admission: RE | Admit: 2018-11-27 | Discharge: 2018-11-27 | Disposition: A | Discharge status: Home / Self Care | Payer: Medicare Other | Source: Ambulatory Visit | Attending: Interventional Radiology | Admitting: Interventional Radiology

## 2018-11-27 ENCOUNTER — Encounter (HOSPITAL_COMMUNITY): Payer: Self-pay

## 2018-11-27 ENCOUNTER — Other Ambulatory Visit (HOSPITAL_COMMUNITY): Payer: Self-pay | Admitting: Interventional Radiology

## 2018-11-27 DIAGNOSIS — Z87891 Personal history of nicotine dependence: Secondary | ICD-10-CM | POA: Diagnosis not present

## 2018-11-27 DIAGNOSIS — Z79899 Other long term (current) drug therapy: Secondary | ICD-10-CM | POA: Diagnosis not present

## 2018-11-27 DIAGNOSIS — J449 Chronic obstructive pulmonary disease, unspecified: Secondary | ICD-10-CM | POA: Diagnosis not present

## 2018-11-27 DIAGNOSIS — I4891 Unspecified atrial fibrillation: Secondary | ICD-10-CM | POA: Diagnosis not present

## 2018-11-27 DIAGNOSIS — Z7901 Long term (current) use of anticoagulants: Secondary | ICD-10-CM | POA: Insufficient documentation

## 2018-11-27 DIAGNOSIS — I509 Heart failure, unspecified: Secondary | ICD-10-CM | POA: Diagnosis not present

## 2018-11-27 DIAGNOSIS — S22060A Wedge compression fracture of T7-T8 vertebra, initial encounter for closed fracture: Secondary | ICD-10-CM | POA: Insufficient documentation

## 2018-11-27 DIAGNOSIS — E7800 Pure hypercholesterolemia, unspecified: Secondary | ICD-10-CM | POA: Insufficient documentation

## 2018-11-27 DIAGNOSIS — X58XXXA Exposure to other specified factors, initial encounter: Secondary | ICD-10-CM | POA: Diagnosis not present

## 2018-11-27 DIAGNOSIS — Z7982 Long term (current) use of aspirin: Secondary | ICD-10-CM | POA: Diagnosis not present

## 2018-11-27 HISTORY — PX: IR KYPHO THORACIC WITH BONE BIOPSY: IMG5518

## 2018-11-27 LAB — CBC
HCT: 42.5 % (ref 36.0–46.0)
Hemoglobin: 13.6 g/dL (ref 12.0–15.0)
MCH: 28.9 pg (ref 26.0–34.0)
MCHC: 32 g/dL (ref 30.0–36.0)
MCV: 90.4 fL (ref 80.0–100.0)
Platelets: 242 10*3/uL (ref 150–400)
RBC: 4.7 MIL/uL (ref 3.87–5.11)
RDW: 14.1 % (ref 11.5–15.5)
WBC: 6.8 10*3/uL (ref 4.0–10.5)
nRBC: 0 % (ref 0.0–0.2)

## 2018-11-27 LAB — URINALYSIS, COMPLETE (UACMP) WITH MICROSCOPIC
Bilirubin Urine: NEGATIVE
Glucose, UA: NEGATIVE mg/dL
Hgb urine dipstick: NEGATIVE
Ketones, ur: NEGATIVE mg/dL
Leukocytes,Ua: NEGATIVE
Nitrite: NEGATIVE
Protein, ur: NEGATIVE mg/dL
Specific Gravity, Urine: 1.015 (ref 1.005–1.030)
pH: 6 (ref 5.0–8.0)

## 2018-11-27 LAB — BASIC METABOLIC PANEL
Anion gap: 10 (ref 5–15)
BUN: 14 mg/dL (ref 6–20)
CO2: 23 mmol/L (ref 22–32)
Calcium: 9.3 mg/dL (ref 8.9–10.3)
Chloride: 106 mmol/L (ref 98–111)
Creatinine, Ser: 0.88 mg/dL (ref 0.44–1.00)
GFR calc Af Amer: >60 mL/min (ref >60)
GFR calc non Af Amer: >60 mL/min (ref >60)
Glucose, Bld: 98 mg/dL (ref 70–99)
Potassium: 3.9 mmol/L (ref 3.5–5.1)
Sodium: 139 mmol/L (ref 135–145)

## 2018-11-27 LAB — PROTIME-INR
INR: 1.1 (ref 0.8–1.2)
Prothrombin Time: 14.5 seconds (ref 11.4–15.2)

## 2018-11-27 LAB — APTT: aPTT: 34 seconds (ref 24–36)

## 2018-11-27 MED ORDER — CEFAZOLIN SODIUM-DEXTROSE 2-4 GM/100ML-% IV SOLN
INTRAVENOUS | Status: AC
  Administered 2018-11-27: 10:00:00 2 g via INTRAVENOUS
  Filled 2018-11-27: qty 100

## 2018-11-27 MED ORDER — HYDROMORPHONE HCL 1 MG/ML IJ SOLN
INTRAMUSCULAR | Status: AC | PRN
  Administered 2018-11-27: 1 mg via INTRAVENOUS

## 2018-11-27 MED ORDER — HYDROMORPHONE HCL 1 MG/ML IJ SOLN
INTRAMUSCULAR | Status: AC
  Filled 2018-11-27: qty 1

## 2018-11-27 MED ORDER — TOBRAMYCIN SULFATE 1.2 G IJ SOLR
INTRAMUSCULAR | Status: AC
  Filled 2018-11-27: qty 1.2

## 2018-11-27 MED ORDER — IOHEXOL 300 MG/ML  SOLN: 5.0000 mL | Freq: Once | INTRAMUSCULAR | Status: DC | PRN

## 2018-11-27 MED ORDER — FENTANYL CITRATE (PF) 100 MCG/2ML IJ SOLN
INTRAMUSCULAR | Status: AC | PRN
  Administered 2018-11-27 (×3): 25 ug via INTRAVENOUS

## 2018-11-27 MED ORDER — SODIUM CHLORIDE 0.9 % IV SOLN
INTRAVENOUS | Status: AC
  Administered 2018-11-27: 12:00:00 via INTRAVENOUS

## 2018-11-27 MED ORDER — BUPIVACAINE HCL (PF) 0.5 % IJ SOLN
INTRAMUSCULAR | Status: AC
  Filled 2018-11-27: qty 30

## 2018-11-27 MED ORDER — FENTANYL CITRATE (PF) 100 MCG/2ML IJ SOLN
INTRAMUSCULAR | Status: AC
  Filled 2018-11-27: qty 2

## 2018-11-27 MED ORDER — CEFAZOLIN SODIUM-DEXTROSE 2-4 GM/100ML-% IV SOLN
2.0000 g | Freq: Once | INTRAVENOUS | Status: AC
  Administered 2018-11-27: 2 g via INTRAVENOUS

## 2018-11-27 MED ORDER — MIDAZOLAM HCL 2 MG/2ML IJ SOLN
INTRAMUSCULAR | Status: AC
  Filled 2018-11-27: qty 2

## 2018-11-27 MED ORDER — BUPIVACAINE HCL 0.25 % IJ SOLN
INTRAMUSCULAR | Status: AC | PRN
  Administered 2018-11-27: 20 mL

## 2018-11-27 MED ORDER — MIDAZOLAM HCL 2 MG/2ML IJ SOLN
INTRAMUSCULAR | Status: AC | PRN
  Administered 2018-11-27 (×2): 1 mg via INTRAVENOUS

## 2018-11-27 NOTE — Procedures (Signed)
S/P T 8 balloon KP 
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
## 2018-11-27 NOTE — Discharge Instructions (Addendum)
1. No stooping,bending or lifting more than 10 lbs for 2 weeks. 2.Use walker to ambulate for 2 weeks. 3.No driving for 2 weeks   Balloon Kyphoplasty, Care After This sheet gives you information about how to care for yourself after your procedure. Your health care provider may also give you more specific instructions. If you have problems or questions, contact your health care provider. What can I expect after the procedure? After your procedure, it is common to have back pain. Follow these instructions at home: Medicines  Take over-the-counter and prescription medicines only as told by your health care provider.  Ask your health care provider if the medicine prescribed to you: ? Requires you to avoid driving or using heavy machinery. ? Can cause constipation. You may need to take steps to prevent or treat constipation, such as:  Drink enough fluid to keep your urine pale yellow.  Take over-the-counter or prescription medicines.  Eat foods that are high in fiber, such as beans, whole grains, and fresh fruits and vegetables.  Limit foods that are high in fat and processed sugars, such as fried or sweet foods. Puncture site care   Follow instructions from your health care provider about how to take care of your puncture site. Make sure you: ? Wash your hands with soap and water before and after you change your bandage (dressing). If soap and water are not available, use hand sanitizer. ? Change your dressing as told by your health care provider. ? Leave skin glue or adhesive strips in place. These skin closures may need to be in place for 2 weeks or longer. If adhesive strip edges start to loosen and curl up, you may trim the loose edges. Do not remove adhesive strips completely unless your health care provider tells you to do that.  Check your puncture site every day for signs of infection. Watch for: ? Redness, swelling, or pain. ? Fluid or blood. ? Warmth. ? Pus or a bad  smell.  Keep your dressing dry until your health care provider says that it can be removed. Managing pain, stiffness, and swelling   If directed, put ice on the painful area. ? Put ice in a plastic bag. ? Place a towel between your skin and the bag. ? Leave the ice on for 20 minutes, 2-3 times a day. Activity  Rest your back and avoid intense physical activity for as long as told by your health care provider.  Avoid bending, lifting, or twisting your back for as long as told by your health care provider.  Return to your normal activities as told by your health care provider. Ask your health care provider what activities are safe for you.  Do not lift anything that is heavier than 5 lb (2.2 kg). You may need to avoid heavy lifting for several weeks. General instructions  Do not use any products that contain nicotine or tobacco, such as cigarettes, e-cigarettes, and chewing tobacco. These can delay bone healing. If you need help quitting, ask your health care provider.  Do not drive for 24 hours if you were given a sedative during your procedure.  Keep all follow-up visits as told by your health care provider. This is important. Contact a health care provider if:  You have a fever or chills.  You have redness, swelling, or pain at the site of your puncture.  You have fluid, blood, or pus coming from the puncture site.  You have pain that gets worse or does not get  better with medicine.  You develop numbness or weakness in any part of your body. Get help right away if:  You have chest pain.  You have difficulty breathing.  You have weakness, numbness, or tingling in your legs.  You cannot control your bladder or bowel movements.  You suddenly become weak or numb on one side of your body.  You become very confused.  You have trouble speaking or understanding, or both. Summary  Follow instructions from your health care provider about how to take care of your puncture  site.  Take over-the-counter and prescription medicines only as told by your health care provider.  Rest your back and avoid intense physical activity for as long as told by your health care provider.  Contact a health care provider if you have pain that gets worse or does not get better with medicine.  Keep all follow-up visits as told by your health care provider. This is important. This information is not intended to replace advice given to you by your health care provider. Make sure you discuss any questions you have with your health care provider. Document Released: 01/21/2015 Document Revised: 04/09/2018 Document Reviewed: 04/09/2018 Elsevier Patient Education  2020 [employer].

## 2018-11-29 ENCOUNTER — Encounter (HOSPITAL_COMMUNITY): Payer: Self-pay | Admitting: Interventional Radiology

## 2018-11-30 ENCOUNTER — Other Ambulatory Visit (HOSPITAL_COMMUNITY)
Admission: RE | Admit: 2018-11-30 | Discharge: 2018-11-30 | Disposition: A | Discharge status: Home / Self Care | Payer: Medicare Other | Source: Ambulatory Visit | Attending: Internal Medicine | Admitting: Internal Medicine

## 2018-11-30 DIAGNOSIS — Z6835 Body mass index (BMI) 35.0-35.9, adult: Secondary | ICD-10-CM | POA: Insufficient documentation

## 2018-11-30 DIAGNOSIS — E785 Hyperlipidemia, unspecified: Secondary | ICD-10-CM | POA: Diagnosis not present

## 2018-11-30 DIAGNOSIS — Z7901 Long term (current) use of anticoagulants: Secondary | ICD-10-CM | POA: Diagnosis not present

## 2018-11-30 DIAGNOSIS — R7303 Prediabetes: Secondary | ICD-10-CM | POA: Diagnosis not present

## 2018-11-30 DIAGNOSIS — Z952 Presence of prosthetic heart valve: Secondary | ICD-10-CM | POA: Diagnosis not present

## 2018-11-30 DIAGNOSIS — I480 Paroxysmal atrial fibrillation: Secondary | ICD-10-CM | POA: Diagnosis not present

## 2018-11-30 LAB — COMPREHENSIVE METABOLIC PANEL
ALT: 15 U/L (ref 0–44)
AST: 20 U/L (ref 15–41)
Albumin: 4.2 g/dL (ref 3.5–5.0)
Alkaline Phosphatase: 65 U/L (ref 38–126)
Anion gap: 11 (ref 5–15)
BUN: 12 mg/dL (ref 6–20)
CO2: 27 mmol/L (ref 22–32)
Calcium: 9.2 mg/dL (ref 8.9–10.3)
Chloride: 101 mmol/L (ref 98–111)
Creatinine, Ser: 0.86 mg/dL (ref 0.44–1.00)
GFR calc Af Amer: >60 mL/min (ref >60)
GFR calc non Af Amer: >60 mL/min (ref >60)
Glucose, Bld: 75 mg/dL (ref 70–99)
Potassium: 3.6 mmol/L (ref 3.5–5.1)
Sodium: 139 mmol/L (ref 135–145)
Total Bilirubin: 1 mg/dL (ref 0.3–1.2)
Total Protein: 7.4 g/dL (ref 6.5–8.1)

## 2018-11-30 LAB — CBC WITH DIFFERENTIAL/PLATELET
Abs Immature Granulocytes: 0.01 10*3/uL (ref 0.00–0.07)
Basophils Absolute: 0 10*3/uL (ref 0.0–0.1)
Basophils Relative: 1 %
Eosinophils Absolute: 0.1 10*3/uL (ref 0.0–0.5)
Eosinophils Relative: 3 %
HCT: 44.4 % (ref 36.0–46.0)
Hemoglobin: 14.3 g/dL (ref 12.0–15.0)
Immature Granulocytes: 0 %
Lymphocytes Relative: 47 %
Lymphs Abs: 2.5 10*3/uL (ref 0.7–4.0)
MCH: 28.7 pg (ref 26.0–34.0)
MCHC: 32.2 g/dL (ref 30.0–36.0)
MCV: 89 fL (ref 80.0–100.0)
Monocytes Absolute: 0.4 10*3/uL (ref 0.1–1.0)
Monocytes Relative: 7 %
Neutro Abs: 2.2 10*3/uL (ref 1.7–7.7)
Neutrophils Relative %: 42 %
Platelets: 256 10*3/uL (ref 150–400)
RBC: 4.99 MIL/uL (ref 3.87–5.11)
RDW: 14 % (ref 11.5–15.5)
WBC: 5.3 10*3/uL (ref 4.0–10.5)
nRBC: 0 % (ref 0.0–0.2)

## 2018-11-30 LAB — LIPID PANEL
Cholesterol: 178 mg/dL (ref 0–200)
HDL: 62 mg/dL (ref >40)
LDL Cholesterol: 97 mg/dL (ref 0–99)
Total CHOL/HDL Ratio: 2.9 RATIO
Triglycerides: 95 mg/dL (ref <150)
VLDL: 19 mg/dL (ref 0–40)

## 2018-11-30 LAB — HEMOGLOBIN A1C
Hgb A1c MFr Bld: 5.3 % (ref 4.8–5.6)
Mean Plasma Glucose: 105.41 mg/dL

## 2018-11-30 LAB — LDL CHOLESTEROL, DIRECT: Direct LDL: 99.4 mg/dL — ABNORMAL HIGH (ref 0–99)

## 2018-11-30 LAB — TSH: TSH: 1.889 u[IU]/mL (ref 0.350–4.500)

## 2018-12-04 LAB — LIPOPROTEIN A (LPA): Lipoprotein (a): 21.7 nmol/L (ref <75.0)

## 2018-12-06 DIAGNOSIS — Z7901 Long term (current) use of anticoagulants: Secondary | ICD-10-CM | POA: Diagnosis not present

## 2018-12-06 DIAGNOSIS — I480 Paroxysmal atrial fibrillation: Secondary | ICD-10-CM | POA: Diagnosis not present
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
## 2018-12-14 ENCOUNTER — Encounter (HOSPITAL_COMMUNITY): Payer: Self-pay | Admitting: Psychiatry

## 2018-12-14 ENCOUNTER — Ambulatory Visit (INDEPENDENT_AMBULATORY_CARE_PROVIDER_SITE_OTHER): Payer: Medicare Other | Admitting: Psychiatry

## 2018-12-14 ENCOUNTER — Other Ambulatory Visit: Payer: Self-pay

## 2018-12-14 DIAGNOSIS — F4310 Post-traumatic stress disorder, unspecified: Secondary | ICD-10-CM | POA: Diagnosis not present

## 2018-12-14 DIAGNOSIS — F39 Unspecified mood [affective] disorder: Secondary | ICD-10-CM | POA: Diagnosis not present

## 2018-12-14 MED ORDER — LAMOTRIGINE 25 MG PO TABS: 75.0000 mg | ORAL_TABLET | Freq: Every day | ORAL | 0 refills | Status: DC

## 2018-12-14 NOTE — Patient Instructions (Signed)
1. Increase lamotrigine 75 mg daily  2. Next appointment: 10/5 at 9:20

## 2018-12-20 ENCOUNTER — Other Ambulatory Visit: Payer: Self-pay

## 2018-12-20 ENCOUNTER — Ambulatory Visit (INDEPENDENT_AMBULATORY_CARE_PROVIDER_SITE_OTHER): Payer: Medicaid Other | Admitting: Licensed Clinical Social Worker

## 2018-12-20 DIAGNOSIS — F4310 Post-traumatic stress disorder, unspecified: Secondary | ICD-10-CM

## 2018-12-20 NOTE — Progress Notes (Signed)
Virtual Visit via Telephone Note  I connected with Haley Patton on 12/20/18 at  9:00 AM EDT by telephone and verified that I am speaking with the correct person using two identifiers.  Location: Patient: Home Provider: Office   I discussed the limitations, risks, security and privacy concerns of performing an evaluation and management service by telephone and the availability of in person appointments. I also discussed with the patient that there may be a patient responsible charge related to this service. The patient expressed understanding and agreed to proceed.  Participation Level: Active  Behavioral Response: CasualAlertDepressed  Type of Therapy: Individual Therapy  Treatment Goals addressed: Coping  Interventions: CBT and Solution Focused  Summary: Haley Patton is a 53 y.o. female who presents oriented x5 (person, place, situation, time, and object), casually dressed, appropriately groomed, average height, average weight and cooperative to address PTSD. Patient has a history of medical treatment including COPD, degenerative joint disease, and heart attack. Patient has a history of mental health treatment including outpatient therapy, medication management, and hospitalization. Patient denies psychosis including auditory and visual hallucinations. Patient denies homicidal and suicidal ideations. Patient denies substance abuse but has a history of substance abuse. Patient is at low risk for lethality at this time.  Physically: Patient continues to pick at her skin. Patient has had some neck pain and back pain. Spiritually/values: No issues identified.  Relationships: Patient has been setting limits with her sons. She is no longer contacting her oldest son several times a week. She only contacted him 3 times between sessions. Patient also has not been sending her youngest son in jail money any more. She does not want to enable him any more.  Emotionally/Mentally/Behavior: Patient  has been anxious. She has been picking her skin more. She couldn't identify a specific reason why. After discussion, patient admitted that she was anxious about establishing care with a new primary care physician. Patient feels like it will take a lot of effort and she will have to go to a new place. Patient understood that it will be uncomfortable but that it will be a temporary pain/discomfort in comparison to the neck pain or back pain she would experience if she didn't establish care to get her pain taken care of. .   Patient engaged in session. She responded well to interventions. 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 Patient will continue in outpatient therapy due to being the least restrictive service to meet her needs. Patient made minimal progress on her goals at this time.   Suicidal/Homicidal: Negativewithout intent/plan  Therapist Response: Therapist reviewed patient's recent thoughts and behavior. Therapist utilized CBT to address mood and anxiety. Therapist processed patient's thoughts to identify triggers for mood and anxiety. Therapist discussed patient's health and setting boundaries with her children.   Plan: Return again in 4 weeks.  Diagnosis: Axis I: Post Traumatic Stress Disorder    Axis II: No diagnosis  I discussed the assessment and treatment plan with the patient. The patient was provided an opportunity to ask questions and all were answered. The patient agreed with the plan and demonstrated an understanding of the instructions.   The patient was advised to call back or seek an in-person evaluation if the symptoms worsen or if the condition fails to improve as anticipated.  I provided 40 minutes of non-face-to-face time during this encounter.     Glori Bickers, LCSW 12/20/2018

## 2019-01-03 ENCOUNTER — Ambulatory Visit (HOSPITAL_COMMUNITY)
Admission: RE | Admit: 2019-01-03 | Discharge: 2019-01-03 | Disposition: A | Discharge status: Home / Self Care | Payer: Medicare Other | Source: Ambulatory Visit | Attending: Internal Medicine | Admitting: Internal Medicine

## 2019-01-03 ENCOUNTER — Other Ambulatory Visit: Payer: Self-pay

## 2019-01-03 ENCOUNTER — Other Ambulatory Visit (HOSPITAL_COMMUNITY): Payer: Self-pay | Admitting: Internal Medicine

## 2019-01-03 DIAGNOSIS — J449 Chronic obstructive pulmonary disease, unspecified: Secondary | ICD-10-CM | POA: Diagnosis not present

## 2019-01-03 DIAGNOSIS — K219 Gastro-esophageal reflux disease without esophagitis: Secondary | ICD-10-CM | POA: Diagnosis not present

## 2019-01-03 DIAGNOSIS — M25532 Pain in left wrist: Secondary | ICD-10-CM

## 2019-01-03 DIAGNOSIS — M542 Cervicalgia: Secondary | ICD-10-CM

## 2019-01-03 DIAGNOSIS — F319 Bipolar disorder, unspecified: Secondary | ICD-10-CM | POA: Diagnosis not present

## 2019-01-03 DIAGNOSIS — Z7901 Long term (current) use of anticoagulants: Secondary | ICD-10-CM | POA: Diagnosis not present

## 2019-01-03 DIAGNOSIS — I480 Paroxysmal atrial fibrillation: Secondary | ICD-10-CM | POA: Diagnosis not present

## 2019-01-07 ENCOUNTER — Ambulatory Visit (INDEPENDENT_AMBULATORY_CARE_PROVIDER_SITE_OTHER): Payer: Medicare Other | Admitting: Psychiatry

## 2019-01-07 ENCOUNTER — Encounter (HOSPITAL_COMMUNITY): Payer: Self-pay | Admitting: Psychiatry

## 2019-01-07 ENCOUNTER — Other Ambulatory Visit: Payer: Self-pay

## 2019-01-07 DIAGNOSIS — F0630 Mood disorder due to known physiological condition, unspecified: Secondary | ICD-10-CM | POA: Diagnosis not present

## 2019-01-07 DIAGNOSIS — F4310 Post-traumatic stress disorder, unspecified: Secondary | ICD-10-CM

## 2019-01-07 DIAGNOSIS — Z62810 Personal history of physical and sexual abuse in childhood: Secondary | ICD-10-CM

## 2019-01-07 MED ORDER — HYDROXYZINE HCL 25 MG PO TABS: 25.0000 mg | ORAL_TABLET | Freq: Every day | ORAL | 1 refills | Status: DC | PRN
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
## 2019-01-10 ENCOUNTER — Ambulatory Visit (INDEPENDENT_AMBULATORY_CARE_PROVIDER_SITE_OTHER): Payer: Medicare Other | Admitting: Nurse Practitioner

## 2019-01-11 ENCOUNTER — Other Ambulatory Visit: Payer: Self-pay

## 2019-01-11 ENCOUNTER — Encounter (HOSPITAL_COMMUNITY): Payer: Self-pay | Admitting: Licensed Clinical Social Worker

## 2019-01-11 ENCOUNTER — Ambulatory Visit (INDEPENDENT_AMBULATORY_CARE_PROVIDER_SITE_OTHER): Payer: Medicaid Other | Admitting: Licensed Clinical Social Worker

## 2019-01-11 DIAGNOSIS — F4310 Post-traumatic stress disorder, unspecified: Secondary | ICD-10-CM | POA: Diagnosis not present

## 2019-01-11 NOTE — Progress Notes (Signed)
Virtual Visit via Telephone Note  I connected with Haley Patton on 01/11/19 at  8:00 AM EDT by telephone and verified that I am speaking with the correct person using two identifiers.  Location: Patient: Home Provider: Office   I discussed the limitations, risks, security and privacy concerns of performing an evaluation and management service by telephone and the availability of in person appointments. I also discussed with the patient that there may be a patient responsible charge related to this service. The patient expressed understanding and agreed to proceed.  Participation Level: Active  Behavioral Response: CasualAlertDepressed  Type of Therapy: Individual Therapy  Treatment Goals addressed: Coping  Interventions: CBT and Solution Focused  Summary: Haley Patton is a 53 y.o. female who presents oriented x5 (person, place, situation, time, and object), casually dressed, appropriately groomed, average height, average weight and cooperative to address PTSD. Patient has a history of medical treatment including COPD, degenerative joint disease, and heart attack. Patient has a history of mental health treatment including outpatient therapy, medication management, and hospitalization. Patient denies psychosis including auditory and visual hallucinations. Patient denies homicidal and suicidal ideations. Patient denies substance abuse but has a history of substance abuse. Patient is at low risk for lethality at this time.  Physically: Patient has a UTI. She has stopped taking her anti biotic due to making her feel bad. Spiritually/values: No issues identified.  Relationships: Patient has struggled with keeping her healthy boundaries with her sons. Patient has been checking on her son who has had to move and is now living with drug dealers.  Emotionally/Mentally/Behavior: Patient has been anxious. She has been worried about her sons. Patient has also not been able to place a call to find a  new doctor. She feels overwhelmed. After discussion, patient agreed to place one call a day to the doctor to find a new one. Patient was able to identify that when she calls her son's less and when she is able to get out in nature/outside she feels better.   Patient engaged in session. She responded well to interventions. 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 Patient will continue in outpatient therapy due to being the least restrictive service to meet her needs. Patient made minimal progress on her goals at this time.   Suicidal/Homicidal: Negativewithout intent/plan  Therapist Response: Therapist reviewed patient's recent thoughts and behavior. Therapist utilized CBT to address mood and anxiety. Therapist processed patient's thoughts to identify triggers for mood and anxiety. Therapist discussed patient's health and managing the anxiety.    Plan: Return again in 4 weeks.  Diagnosis: Axis I: Post Traumatic Stress Disorder    Axis II: No diagnosis  I discussed the assessment and treatment plan with the patient. The patient was provided an opportunity to ask questions and all were answered. The patient agreed with the plan and demonstrated an understanding of the instructions.   The patient was advised to call back or seek an in-person evaluation if the symptoms worsen or if the condition fails to improve as anticipated.  I provided 40 minutes of non-face-to-face time during this encounter.     Glori Bickers, LCSW 01/11/2019

## 2019-01-14 ENCOUNTER — Other Ambulatory Visit (HOSPITAL_COMMUNITY): Payer: Self-pay | Admitting: Internal Medicine

## 2019-01-14 DIAGNOSIS — Z1231 Encounter for screening mammogram for malignant neoplasm of breast: Secondary | ICD-10-CM

## 2019-01-15 DIAGNOSIS — S63502A Unspecified sprain of left wrist, initial encounter: Secondary | ICD-10-CM | POA: Diagnosis not present

## 2019-01-17 ENCOUNTER — Ambulatory Visit (INDEPENDENT_AMBULATORY_CARE_PROVIDER_SITE_OTHER): Payer: Medicare Other | Admitting: Nurse Practitioner

## 2019-01-23 ENCOUNTER — Other Ambulatory Visit: Payer: Self-pay

## 2019-01-23 ENCOUNTER — Ambulatory Visit (HOSPITAL_COMMUNITY)
Admission: RE | Admit: 2019-01-23 | Discharge: 2019-01-23 | Disposition: A | Discharge status: Home / Self Care | Payer: Medicare Other | Source: Ambulatory Visit | Attending: Internal Medicine | Admitting: Internal Medicine

## 2019-01-23 ENCOUNTER — Other Ambulatory Visit (HOSPITAL_COMMUNITY)
Admission: RE | Admit: 2019-01-23 | Discharge: 2019-01-23 | Disposition: A | Discharge status: Home / Self Care | Payer: Medicare Other | Source: Ambulatory Visit | Attending: Internal Medicine | Admitting: Internal Medicine

## 2019-01-23 DIAGNOSIS — Z1231 Encounter for screening mammogram for malignant neoplasm of breast: Secondary | ICD-10-CM | POA: Diagnosis not present

## 2019-01-23 LAB — URINALYSIS, ROUTINE W REFLEX MICROSCOPIC
Bilirubin Urine: NEGATIVE
Glucose, UA: NEGATIVE mg/dL
Hgb urine dipstick: NEGATIVE
Ketones, ur: NEGATIVE mg/dL
Leukocytes,Ua: NEGATIVE
Nitrite: NEGATIVE
Protein, ur: NEGATIVE mg/dL
Specific Gravity, Urine: 1.01 (ref 1.005–1.030)
pH: 6 (ref 5.0–8.0)

## 2019-01-28 DIAGNOSIS — Z79899 Other long term (current) drug therapy: Secondary | ICD-10-CM | POA: Diagnosis not present

## 2019-01-28 DIAGNOSIS — R300 Dysuria: Secondary | ICD-10-CM | POA: Diagnosis not present

## 2019-01-28 DIAGNOSIS — Z7901 Long term (current) use of anticoagulants: Secondary | ICD-10-CM | POA: Diagnosis not present

## 2019-01-28 DIAGNOSIS — Z7982 Long term (current) use of aspirin: Secondary | ICD-10-CM | POA: Diagnosis not present

## 2019-01-28 DIAGNOSIS — R1084 Generalized abdominal pain: Secondary | ICD-10-CM | POA: Diagnosis not present

## 2019-01-28 DIAGNOSIS — N159 Renal tubulo-interstitial disease, unspecified: Secondary | ICD-10-CM | POA: Diagnosis not present

## 2019-01-28 DIAGNOSIS — K5901 Slow transit constipation: Secondary | ICD-10-CM | POA: Diagnosis not present

## 2019-01-28 DIAGNOSIS — M545 Low back pain: Secondary | ICD-10-CM | POA: Diagnosis not present

## 2019-01-28 DIAGNOSIS — F17200 Nicotine dependence, unspecified, uncomplicated: Secondary | ICD-10-CM | POA: Diagnosis not present

## 2019-01-31 DIAGNOSIS — Z7901 Long term (current) use of anticoagulants: Secondary | ICD-10-CM | POA: Diagnosis not present

## 2019-01-31 DIAGNOSIS — I480 Paroxysmal atrial fibrillation: Secondary | ICD-10-CM | POA: Diagnosis not present

## 2019-02-03 DIAGNOSIS — I2510 Atherosclerotic heart disease of native coronary artery without angina pectoris: Secondary | ICD-10-CM | POA: Diagnosis not present

## 2019-02-03 DIAGNOSIS — J449 Chronic obstructive pulmonary disease, unspecified: Secondary | ICD-10-CM | POA: Diagnosis not present

## 2019-02-04 ENCOUNTER — Ambulatory Visit (HOSPITAL_COMMUNITY): Payer: Medicare Other | Admitting: Licensed Clinical Social Worker

## 2019-02-04 ENCOUNTER — Telehealth (HOSPITAL_COMMUNITY): Payer: Self-pay | Admitting: Licensed Clinical Social Worker

## 2019-02-04 ENCOUNTER — Other Ambulatory Visit: Payer: Self-pay

## 2019-02-04 NOTE — Telephone Encounter (Signed)
I connected with Haley Patton by phone. After 5 minutes she ended session due to not feeling like talking. I had previously offered her a shorter session of 15 minutes due to her not feeling well. This will be counted as a no-show.
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
## 2019-02-10 NOTE — Progress Notes (Addendum)
Subjective:    Patient ID: Haley Patton, female    DOB: 11-23-1965, 53 y.o.   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 porcine valve) + MAZE + ligation of left atrial appendage 10/01/2015.  She is on warfarin.  She is followed by cardiologist, Dr. Sharman Cheek.  She presents today for complaints of chronic left upper quadrant/flank pain which started 2 years ago.  She complains of abdominal distention, she feels knots in her abdomen. She presented to [facility] on 01/28/2019 with complaints of lower back pain and left flank pain.  An abdominal x-ray was done which showed stool in the colon. She was diagnosed with constipation and prescribed magnesium citrate and she was discharged home.  A urinalysis was negative.  No blood testing was completed.  She had a similar emergency room visit to [facility] 08/28/2018.  She complains of central abdominal distention with pain specifically to the left upper quadrant and left flank area.  She feels several hard knots to the left side of her abdomen.  She feels the central upper part of her abdomen protrudes. She is passing a normal formed bowel movement once daily, sometimes skips a few days.  No rectal bleeding or black stool.  No nausea or vomiting.  No heartburn or dysphasia.  No fever, sweats or chills.  No weight loss. She underwent a colonoscopy in October of 2019 by Dr. Anthony Sar which identified as several hyperplastic polyps in the descending and sigmoid colon, a repeat colonoscopy in 5 years was recommended, earlier if stool heme positive.  She continues to smoke cigarettes.  No alcohol use.  Labs 11/30/2018: Sodium 139.  Potassium 3.6.  BUN 12.  Creatinine 0.86.  Calcium 9.2.  AST 20.  ALT 15.  Alk phos 65.  WBC 5.3.  Hemoglobin 14.3.  Hematocrit 44.4.  Platelet 256.  Abdominal/pelvic CT 04/26/2018:  1. No acute findings. 2. Scattered sigmoid diverticula. 3. Aortoiliac  atherosclerosis    Past Medical History:  Diagnosis Date  . Atrial fibrillation ([place])   . CHF (congestive heart failure) ([place])   . Chronic left flank pain   . COPD (chronic obstructive pulmonary disease) ([place])   . DJD (degenerative joint disease)   . High cholesterol   . PTSD (post-traumatic stress disorder)    Past Surgical History:  Procedure Laterality Date  . ABDOMINAL HYSTERECTOMY    . CERVICAL SPINE SURGERY    . IR KYPHO THORACIC WITH BONE BIOPSY  11/27/2018  . VALVE REPLACEMENT     Current Outpatient Medications on File Prior to Visit  Medication Sig Dispense Refill  . albuterol (VENTOLIN HFA) 108 (90 Base) MCG/ACT inhaler Inhale 2 puffs into the lungs every 4 (four) hours as needed for wheezing or shortness of breath.    Marland Kitchen aspirin EC 81 MG tablet Take 81 mg by mouth daily.    . cetirizine (ZYRTEC) 10 MG tablet Take 10 mg by mouth daily.    . fluticasone (FLONASE) 50 MCG/ACT nasal spray Place 1 spray into both nostrils daily.    . furosemide (LASIX) 40 MG tablet Take 40 mg by mouth daily.    . hydrOXYzine (ATARAX/VISTARIL) 25 MG tablet Take 1 tablet (25 mg total) by mouth daily as needed for anxiety. 30 tablet 1  . Ipratropium-Albuterol (COMBIVENT RESPIMAT) 20-100 MCG/ACT AERS respimat Inhale 1 puff into the  lungs every 6 (six) hours.    Marland Kitchen lamoTRIgine (LAMICTAL) 25 MG tablet Take 3 tablets (75 mg total) by mouth daily. 270 tablet 0  . metoprolol succinate (TOPROL-XL) 25 MG 24 hr tablet Take 25 mg by mouth daily.    Marland Kitchen omeprazole (PRILOSEC) 40 MG capsule Take 40 mg by mouth daily.    Marland Kitchen OZEMPIC, 0.25 OR 0.5 MG/DOSE, 2 MG/1.5ML SOPN INJECT 0.25 MG SUBCUTANEOUSLY ONCE A WEEK FOR 4 WEEKS THEN INCREASE TO 0.5 MG WEEKLY    . pravastatin (PRAVACHOL) 40 MG tablet Take 40 mg by mouth at bedtime.     . pregabalin (LYRICA) 150 MG capsule Take 150 mg by mouth 3 (three) times daily.    . psyllium (METAMUCIL) 58.6 %  powder Take 1 packet by mouth daily.    . traMADol (ULTRAM) 50 MG tablet Take 50 mg by mouth 2 (two) times daily as needed.    . warfarin (COUMADIN) 2.5 MG tablet Take 3.75 mg by mouth daily.      No current facility-administered medications on file prior to visit.    Allergies  Allergen Reactions  . Lithium     Vomiting   . Tape Other (See Comments)    blisters  . Tobacco [Tobacco] Other (See Comments)    Increasing lung deterioration    Review of Systems See HPI, all other systems reviewed and are negative    Objective:   Physical Exam BP 128/85   Pulse 73   Temp 98.8 F (37.1 C) (Oral)   Ht 5' 5"  (1.651 m)   Wt 221 lb 4.8 oz (100.4 kg)   BMI 36.83 kg/m  General: 53 year old female in no acute distress Eyes: Sclera nonicteric, conjunctiva pink Mouth: Poor dentition, no ulcers or lesions Neck: Supple, no lymphadenopathy or thyromegaly Heart: Regular rate and rhythm, no murmurs Lungs: Breath sounds clear throughout Abdomen: The upper abdomen protrudes without hernia appreciated, 1-2 small firm lipoma type lesions to the left upper quadrant identified with deep palpation, mild left upper quadrant tenderness, negative CVA tenderness no lower abdominal tenderness, no HSM, positive bowel sounds to all 4 quadrants Extremities: No edema Neuro: Alert and oriented x4, no focal deficits     Assessment & Plan:   29.  53 year old female with chronic left upper quadrant/left flank pain -Abdominal MRI to further evaluate the pancreas as passed CT imaging did not explain her left upper quadrant/flank pain -Discuss scheduling the EGD if abdominal MRI negative -Follow-up in the office in 3 to 4 weeks  2.  Constipation.  Colonoscopy 02/2018 by Dr. Anthony Sar identified hyperplastic polyps. -MiraLAX 1 capful mixed in 8 ounces water at bedtime -next colonoscopy due 02/2023  3.  Status post MVR secondary to rheumatic heart disease ligation of left atrial appendage 10/01/2015.  She is on  warfarin.

## 2019-02-12 ENCOUNTER — Encounter (INDEPENDENT_AMBULATORY_CARE_PROVIDER_SITE_OTHER): Payer: Self-pay | Admitting: *Deleted

## 2019-02-12 ENCOUNTER — Ambulatory Visit (INDEPENDENT_AMBULATORY_CARE_PROVIDER_SITE_OTHER): Payer: Medicare Other | Admitting: Nurse Practitioner

## 2019-02-12 ENCOUNTER — Other Ambulatory Visit: Payer: Self-pay

## 2019-02-12 ENCOUNTER — Encounter (INDEPENDENT_AMBULATORY_CARE_PROVIDER_SITE_OTHER): Payer: Self-pay | Admitting: Nurse Practitioner

## 2019-02-12 VITALS — BP 128/85 | HR 73 | Temp 98.8°F | Ht 65.0 in | Wt 221.3 lb

## 2019-02-12 DIAGNOSIS — R1012 Left upper quadrant pain: Secondary | ICD-10-CM | POA: Diagnosis not present

## 2019-02-12 DIAGNOSIS — R109 Unspecified abdominal pain: Secondary | ICD-10-CM

## 2019-02-12 NOTE — Patient Instructions (Signed)
1. Complete the lab order   2. Schedule an abdominal MRI  3. Follow up in office in 3 to 4 weeks   4. If your abdominal MRI is normal, we will discuss scheduling an upper endoscopy to evaluate your stomach

## 2019-02-13 LAB — CBC WITH DIFFERENTIAL/PLATELET
Absolute Monocytes: 420 cells/uL (ref 200–950)
Basophils Absolute: 30 cells/uL (ref 0–200)
Basophils Relative: 0.5 %
Eosinophils Absolute: 48 cells/uL (ref 15–500)
Eosinophils Relative: 0.8 %
HCT: 45.2 % — ABNORMAL HIGH (ref 35.0–45.0)
Hemoglobin: 15.3 g/dL (ref 11.7–15.5)
Lymphs Abs: 2646 cells/uL (ref 850–3900)
MCH: 29.4 pg (ref 27.0–33.0)
MCHC: 33.8 g/dL (ref 32.0–36.0)
MCV: 86.8 fL (ref 80.0–100.0)
MPV: 10.8 fL (ref 7.5–12.5)
Monocytes Relative: 7 %
Neutro Abs: 2856 cells/uL (ref 1500–7800)
Neutrophils Relative %: 47.6 %
Platelets: 266 10*3/uL (ref 140–400)
RBC: 5.21 10*6/uL — ABNORMAL HIGH (ref 3.80–5.10)
RDW: 13.6 % (ref 11.0–15.0)
Total Lymphocyte: 44.1 %
WBC: 6 10*3/uL (ref 3.8–10.8)

## 2019-02-13 LAB — COMPLETE METABOLIC PANEL WITH GFR
AG Ratio: 2 (calc) (ref 1.0–2.5)
ALT: 12 U/L (ref 6–29)
AST: 18 U/L (ref 10–35)
Albumin: 4.8 g/dL (ref 3.6–5.1)
Alkaline phosphatase (APISO): 61 U/L (ref 37–153)
BUN/Creatinine Ratio: 7 (calc) (ref 6–22)
BUN: 6 mg/dL — ABNORMAL LOW (ref 7–25)
CO2: 26 mmol/L (ref 20–32)
Calcium: 10 mg/dL (ref 8.6–10.4)
Chloride: 104 mmol/L (ref 98–110)
Creat: 0.91 mg/dL (ref 0.50–1.05)
GFR, Est African American: 84 mL/min/{1.73_m2} (ref >=60)
GFR, Est Non African American: 73 mL/min/{1.73_m2} (ref >=60)
Globulin: 2.4 g/dL (calc) (ref 1.9–3.7)
Glucose, Bld: 86 mg/dL (ref 65–139)
Potassium: 4.1 mmol/L (ref 3.5–5.3)
Sodium: 141 mmol/L (ref 135–146)
Total Bilirubin: 0.6 mg/dL (ref 0.2–1.2)
Total Protein: 7.2 g/dL (ref 6.1–8.1)

## 2019-02-13 LAB — LIPASE: Lipase: 40 U/L (ref 7–60)

## 2019-02-13 LAB — C-REACTIVE PROTEIN: CRP: 5.7 mg/L (ref <8.0)

## 2019-02-18 ENCOUNTER — Encounter (HOSPITAL_COMMUNITY): Payer: Self-pay | Admitting: Psychiatry

## 2019-02-18 ENCOUNTER — Ambulatory Visit (INDEPENDENT_AMBULATORY_CARE_PROVIDER_SITE_OTHER): Payer: Medicare Other | Admitting: Psychiatry

## 2019-02-18 ENCOUNTER — Other Ambulatory Visit: Payer: Self-pay

## 2019-02-18 DIAGNOSIS — F4310 Post-traumatic stress disorder, unspecified: Secondary | ICD-10-CM

## 2019-02-18 DIAGNOSIS — F0630 Mood disorder due to known physiological condition, unspecified: Secondary | ICD-10-CM

## 2019-02-18 MED ORDER — LORAZEPAM 1 MG PO TABS: 1.0000 mg | ORAL_TABLET | Freq: Once | ORAL | 0 refills | Status: AC

## 2019-02-18 MED ORDER — LAMOTRIGINE 25 MG PO TABS: 75.0000 mg | ORAL_TABLET | Freq: Every day | ORAL | 1 refills | Status: DC

## 2019-02-18 NOTE — Patient Instructions (Signed)
1.Continue lamotrigine75mg  daily  2. Continue hydroxyzine 25 mg daily as needed for anxiety  3. Next appointment: in January

## 2019-02-19 ENCOUNTER — Ambulatory Visit (HOSPITAL_COMMUNITY)
Admission: RE | Admit: 2019-02-19 | Discharge: 2019-02-19 | Disposition: A | Discharge status: Home / Self Care | Payer: Medicare Other | Source: Ambulatory Visit | Attending: Nurse Practitioner | Admitting: Nurse Practitioner

## 2019-02-19 ENCOUNTER — Other Ambulatory Visit (INDEPENDENT_AMBULATORY_CARE_PROVIDER_SITE_OTHER): Payer: Self-pay | Admitting: Nurse Practitioner

## 2019-02-19 ENCOUNTER — Other Ambulatory Visit: Payer: Self-pay

## 2019-02-19 DIAGNOSIS — R109 Unspecified abdominal pain: Secondary | ICD-10-CM

## 2019-02-19 DIAGNOSIS — R1012 Left upper quadrant pain: Secondary | ICD-10-CM

## 2019-02-19 DIAGNOSIS — R10A0 Flank pain, unspecified side: Secondary | ICD-10-CM

## 2019-02-19 MED ORDER — GADOBUTROL 1 MMOL/ML IV SOLN
7.0000 mL | Freq: Once | INTRAVENOUS | Status: AC | PRN
  Administered 2019-02-19: 7 mL via INTRAVENOUS

## 2019-02-20 ENCOUNTER — Telehealth (INDEPENDENT_AMBULATORY_CARE_PROVIDER_SITE_OTHER): Payer: Self-pay | Admitting: Nurse Practitioner

## 2019-02-20 NOTE — Telephone Encounter (Signed)
Tammy, please contact the patient in 8 weeks to have a repeat CBC with iron, iron saturation and ferritin level. thx

## 2019-02-25 ENCOUNTER — Other Ambulatory Visit: Payer: Self-pay

## 2019-02-25 ENCOUNTER — Ambulatory Visit (INDEPENDENT_AMBULATORY_CARE_PROVIDER_SITE_OTHER): Payer: Medicaid Other | Admitting: Licensed Clinical Social Worker

## 2019-02-25 ENCOUNTER — Encounter (HOSPITAL_COMMUNITY): Payer: Self-pay | Admitting: Licensed Clinical Social Worker

## 2019-02-25 DIAGNOSIS — F4310 Post-traumatic stress disorder, unspecified: Secondary | ICD-10-CM

## 2019-02-25 NOTE — Progress Notes (Signed)
Virtual Visit via Telephone Note  I connected with Haley Patton on 02/25/19 at 10:00 AM EDT by telephone and verified that I am speaking with the correct person using two identifiers.  Location: Patient: Home Provider: Office   I discussed the limitations, risks, security and privacy concerns of performing an evaluation and management service by telephone and the availability of in person appointments. I also discussed with the patient that there may be a patient responsible charge related to this service. The patient expressed understanding and agreed to proceed.  Participation Level: Active  Behavioral Response: CasualAlertDepressed  Type of Therapy: Individual Therapy  Treatment Goals addressed: Coping  Interventions: CBT and Solution Focused  Summary: Haley Patton is a 53 y.o. female who presents oriented x5 (person, place, situation, time, and object), casually dressed, appropriately groomed, average height, average weight and cooperative to address PTSD. Patient has a history of medical treatment including COPD, degenerative joint disease, and heart attack. Patient has a history of mental health treatment including outpatient therapy, medication management, and hospitalization. Patient denies psychosis including auditory and visual hallucinations. Patient denies homicidal and suicidal ideations. Patient denies substance abuse but has a history of substance abuse. Patient is at low risk for lethality at this time.  Physically: Patient has been having GI issues. She has chronic constipation and it causes her constant discomfort. She also noticed that she has cysts that were found on an MRI and feels like her doctor has not addressed it. Patient has a follow up appointment and agreed to advocate for herself and ask about the concerns she has as well as asking about taking something to help with her bowel movements.  Spiritually/values: No issues identified.  Relationships: Patient  got her oldest son who was living [place] the woods and brought him home. She has been making sure he eats, etc. Patient could not talk about more due to feeling "crowded" by her son and partner.   Emotionally/Mentally/Behavior: Patient's mood has been up and down. She hasn't felt well physically. She agrees to continue to call to find a new doctor and to advocate for herself during her doctors visit.   Patient engaged in session. She responded well to interventions. 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 Patient will continue in outpatient therapy due to being the least restrictive service to meet her needs. Patient made minimal progress on her goals at this time.   Suicidal/Homicidal: Negativewithout intent/plan  Therapist Response: Therapist reviewed patient's recent thoughts and behavior. Therapist utilized CBT to address mood and anxiety. Therapist processed patient's thoughts to identify triggers for mood and anxiety. Therapist discussed patient's health and managing anxiety to call around for a new doctor.   Plan: Return again in 4 weeks.  Diagnosis: Axis I: Post Traumatic Stress Disorder    Axis II: No diagnosis  I discussed the assessment and treatment plan with the patient. The patient was provided an opportunity to ask questions and all were answered. The patient agreed with the plan and demonstrated an understanding of the instructions.   The patient was advised to call back or seek an in-person evaluation if the symptoms worsen or if the condition fails to improve as anticipated.  I provided 30 minutes of non-face-to-face time during this encounter.     Glori Bickers, LCSW 02/25/2019

## 2019-02-27 ENCOUNTER — Other Ambulatory Visit (INDEPENDENT_AMBULATORY_CARE_PROVIDER_SITE_OTHER): Payer: Self-pay | Admitting: *Deleted

## 2019-02-27 DIAGNOSIS — Z7901 Long term (current) use of anticoagulants: Secondary | ICD-10-CM

## 2019-02-27 DIAGNOSIS — R1012 Left upper quadrant pain: Secondary | ICD-10-CM

## 2019-02-27 NOTE — Telephone Encounter (Signed)
Lab work has been completed , how ever the [employer] would not cover. A waiver was printed. The orders were ordered under Dr.Rehman so that he will get the results.

## 2019-03-01 DIAGNOSIS — I480 Paroxysmal atrial fibrillation: Secondary | ICD-10-CM | POA: Diagnosis not present

## 2019-03-01 DIAGNOSIS — I2510 Atherosclerotic heart disease of native coronary artery without angina pectoris: Secondary | ICD-10-CM | POA: Diagnosis not present

## 2019-03-01 DIAGNOSIS — Z7901 Long term (current) use of anticoagulants: Secondary | ICD-10-CM | POA: Diagnosis not present

## 2019-03-01 DIAGNOSIS — K219 Gastro-esophageal reflux disease without esophagitis: Secondary | ICD-10-CM | POA: Diagnosis not present

## 2019-03-07 DIAGNOSIS — Z23 Encounter for immunization: Secondary | ICD-10-CM | POA: Diagnosis not present

## 2019-03-07 DIAGNOSIS — J449 Chronic obstructive pulmonary disease, unspecified: Secondary | ICD-10-CM | POA: Diagnosis not present

## 2019-03-07 DIAGNOSIS — F17200 Nicotine dependence, unspecified, uncomplicated: Secondary | ICD-10-CM | POA: Diagnosis not present

## 2019-03-07 DIAGNOSIS — R109 Unspecified abdominal pain: Secondary | ICD-10-CM | POA: Diagnosis not present

## 2019-03-28 ENCOUNTER — Ambulatory Visit (INDEPENDENT_AMBULATORY_CARE_PROVIDER_SITE_OTHER): Payer: Medicare Other | Admitting: Nurse Practitioner

## 2019-03-28 DIAGNOSIS — I480 Paroxysmal atrial fibrillation: Secondary | ICD-10-CM | POA: Diagnosis not present

## 2019-03-28 DIAGNOSIS — Z7901 Long term (current) use of anticoagulants: Secondary | ICD-10-CM | POA: Diagnosis not present

## 2019-04-01 ENCOUNTER — Other Ambulatory Visit: Payer: Self-pay

## 2019-04-01 ENCOUNTER — Ambulatory Visit (INDEPENDENT_AMBULATORY_CARE_PROVIDER_SITE_OTHER): Payer: Medicaid Other | Admitting: Licensed Clinical Social Worker

## 2019-04-01 ENCOUNTER — Encounter (HOSPITAL_COMMUNITY): Payer: Self-pay | Admitting: Licensed Clinical Social Worker

## 2019-04-01 DIAGNOSIS — F4310 Post-traumatic stress disorder, unspecified: Secondary | ICD-10-CM

## 2019-04-01 NOTE — Progress Notes (Signed)
Virtual Visit via Telephone Note  I connected with Haley Patton on 04/01/19 at 11:00 AM EST by telephone and verified that I am speaking with the correct person using two identifiers.  Location: Patient: Home Provider: Office   I discussed the limitations, risks, security and privacy concerns of performing an evaluation and management service by telephone and the availability of in person appointments. I also discussed with the patient that there may be a patient responsible charge related to this service. The patient expressed understanding and agreed to proceed.  Participation Level: Active  Behavioral Response: CasualAlertDepressed  Type of Therapy: Individual Therapy  Treatment Goals addressed: Coping  Interventions: CBT and Solution Focused  Summary: Haley Patton is a 53 y.o. female who presents oriented x5 (person, place, situation, time, and object), casually dressed, appropriately groomed, average height, average weight and cooperative to address PTSD. Patient has a history of medical treatment including COPD, degenerative joint disease, and heart attack. Patient has a history of mental health treatment including outpatient therapy, medication management, and hospitalization. Patient denies psychosis including auditory and visual hallucinations. Patient denies homicidal and suicidal ideations. Patient denies substance abuse but has a history of substance abuse. Patient is at low risk for lethality at this time.  Physically: Patient has lost weight due to changes that she has made. Patient is allergic to tobacco but continues to smoke. She has a sinus infection and has called her doctor for appropriate treatment. She feels like her cysts on her abdomen were not taken serious by her doctor. She saw her mother have cysts that grew and were not removed until they weighed several pounds.  Spiritually/values: Patient is very family oriented.  Relationships: Patient's relationship with  her son has improved.  Emotionally/Mentally/Behavior: Patient reports that things are getting better "slowly but surely." Patient has been taking steps to work on her physical health, her relationships, and continue to try to see "positves" where she can. Patient understood that if she can be consistent with this then it will greatly improve her mood.   Patient engaged in session. She responded well to interventions. 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 Patient will continue in outpatient therapy due to being the least restrictive service to meet her needs. Patient made minimal progress on her goals at this time.   Suicidal/Homicidal: Negativewithout intent/plan  Therapist Response: Therapist reviewed patient's recent thoughts and behavior. Therapist utilized CBT to address mood and anxiety. Therapist processed patient's thoughts to identify triggers for mood and anxiety. Therapist had patient identify what has gone well with patient and resources she has to manage her mood.   Plan: Return again in 4 weeks.  Diagnosis: Axis I: Post Traumatic Stress Disorder    Axis II: No diagnosis  I discussed the assessment and treatment plan with the patient. The patient was provided an opportunity to ask questions and all were answered. The patient agreed with the plan and demonstrated an understanding of the instructions.   The patient was advised to call back or seek an in-person evaluation if the symptoms worsen or if the condition fails to improve as anticipated.  I provided 40 minutes of non-face-to-face time during this encounter.     Glori Bickers, LCSW 04/01/2019

## 2019-04-08 ENCOUNTER — Other Ambulatory Visit (INDEPENDENT_AMBULATORY_CARE_PROVIDER_SITE_OTHER): Payer: Self-pay | Admitting: *Deleted

## 2019-04-08 DIAGNOSIS — R1012 Left upper quadrant pain: Secondary | ICD-10-CM

## 2019-04-23 ENCOUNTER — Ambulatory Visit: Payer: Medicare Other | Admitting: Podiatry

## 2019-04-28 DIAGNOSIS — Z7901 Long term (current) use of anticoagulants: Secondary | ICD-10-CM | POA: Diagnosis not present

## 2019-04-28 DIAGNOSIS — I480 Paroxysmal atrial fibrillation: Secondary | ICD-10-CM | POA: Diagnosis not present

## 2019-04-29 ENCOUNTER — Ambulatory Visit (INDEPENDENT_AMBULATORY_CARE_PROVIDER_SITE_OTHER): Payer: Medicare Other | Admitting: Licensed Clinical Social Worker

## 2019-04-29 ENCOUNTER — Other Ambulatory Visit: Payer: Self-pay

## 2019-04-29 ENCOUNTER — Encounter (HOSPITAL_COMMUNITY): Payer: Self-pay | Admitting: Licensed Clinical Social Worker

## 2019-04-29 DIAGNOSIS — F0630 Mood disorder due to known physiological condition, unspecified: Secondary | ICD-10-CM

## 2019-04-29 DIAGNOSIS — F4310 Post-traumatic stress disorder, unspecified: Secondary | ICD-10-CM | POA: Diagnosis not present

## 2019-04-29 NOTE — Progress Notes (Signed)
Virtual Visit via Telephone Note  I connected with Haley Patton on 04/29/19 at 11:00 AM EST by telephone and verified that I am speaking with the correct person using two identifiers.  Location: Patient: Home Provider: Office   I discussed the limitations, risks, security and privacy concerns of performing an evaluation and management service by telephone and the availability of in person appointments. I also discussed with the patient that there may be a patient responsible charge related to this service. The patient expressed understanding and agreed to proceed.  Participation Level: Active  Behavioral Response: CasualAlertDepressed  Type of Therapy: Individual Therapy  Treatment Goals addressed: Coping  Interventions: CBT and Solution Focused  Summary: Haley Patton is a 53 y.o. female who presents oriented x5 (person, place, situation, time, and object), casually dressed, appropriately groomed, average height, average weight and cooperative to address PTSD. Patient has a history of medical treatment including COPD, degenerative joint disease, and heart attack. Patient has a history of mental health treatment including outpatient therapy, medication management, and hospitalization. Patient denies psychosis including auditory and visual hallucinations. Patient denies homicidal and suicidal ideations. Patient denies substance abuse but has a history of substance abuse. Patient is at low risk for lethality at this time.  Physically: Patient reports that her pain has increased due to cooler weather. She is sleeping better and not using her inhaler as much. Patient has a rash on her shoulders that started the previous day that she is unsure what is occurring. Patient was directed to contact her PCP if her rash does not go away or increases.  Spiritually/values: None reported   Relationships: Patient reported that she is getting along with her son for "now." Patient noted that she lost her  ex at the beginning of the month. It was difficult for her but she is doing better with it now.  Emotionally/Mentally/Behavior: Patient continues to report improved mood and reduced anxiety. She feels like sleep and weight loss have played a big factor in her improved mood at this time. Patient continues to struggle with social anxiety but is taking steps to improve her life on a regular basis.    Patient engaged in session. She responded well to interventions. 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 Patient will continue in outpatient therapy due to being the least restrictive service to meet her needs. Patient made minimal progress on her goals at this time.   Suicidal/Homicidal: Negativewithout intent/plan  Therapist Response: Therapist reviewed patient's recent thoughts and behavior. Therapist utilized CBT to address mood and anxiety. Therapist processed patient's thoughts to identify triggers for mood and anxiety. Therapist had patient identify what has gone well with patient and discussed grief related to losing her ex.    Plan: Return again in 4 weeks.  Diagnosis: Axis I: Post Traumatic Stress Disorder    Axis II: No diagnosis  I discussed the assessment and treatment plan with the patient. The patient was provided an opportunity to ask questions and all were answered. The patient agreed with the plan and demonstrated an understanding of the instructions.   The patient was advised to call back or seek an in-person evaluation if the symptoms worsen or if the condition fails to improve as anticipated.  I provided 20 minutes of non-face-to-face time during this encounter.     Glori Bickers, LCSW 04/29/2019

## 2019-05-03 ENCOUNTER — Ambulatory Visit: Payer: Medicare Other | Admitting: Podiatry

## 2019-05-20 ENCOUNTER — Telehealth (HOSPITAL_COMMUNITY): Payer: Self-pay | Admitting: Clinical

## 2019-05-20 ENCOUNTER — Ambulatory Visit (HOSPITAL_COMMUNITY): Payer: Medicare Other | Admitting: Clinical

## 2019-05-20 ENCOUNTER — Other Ambulatory Visit: Payer: Self-pay

## 2019-05-20 NOTE — Telephone Encounter (Signed)
The [facility] therapist attempted text to session x2 at 11:00AM and 11:10AM, however, the patient did not respond and missed her scheduled session.

## 2019-05-27 ENCOUNTER — Ambulatory Visit (HOSPITAL_COMMUNITY): Payer: Medicaid Other | Admitting: Psychiatry

## 2019-06-05 ENCOUNTER — Encounter: Payer: Self-pay | Admitting: Psychiatry

## 2019-06-05 ENCOUNTER — Ambulatory Visit (INDEPENDENT_AMBULATORY_CARE_PROVIDER_SITE_OTHER): Payer: Medicare Other | Admitting: Psychiatry

## 2019-06-05 ENCOUNTER — Other Ambulatory Visit: Payer: Self-pay

## 2019-06-05 DIAGNOSIS — F0630 Mood disorder due to known physiological condition, unspecified: Secondary | ICD-10-CM | POA: Diagnosis not present

## 2019-06-05 DIAGNOSIS — F4310 Post-traumatic stress disorder, unspecified: Secondary | ICD-10-CM | POA: Diagnosis not present

## 2019-06-05 MED ORDER — HYDROXYZINE HCL 25 MG PO TABS: 25.0000 mg | ORAL_TABLET | Freq: Every day | ORAL | 0 refills | Status: AC | PRN

## 2019-06-05 MED ORDER — LAMOTRIGINE 25 MG PO TABS: 75.0000 mg | ORAL_TABLET | Freq: Every day | ORAL | 0 refills | Status: DC

## 2019-06-05 MED ORDER — LORAZEPAM 0.5 MG PO TABS: 0.5000 mg | ORAL_TABLET | Freq: Every day | ORAL | 0 refills | Status: DC | PRN

## 2019-06-05 NOTE — Progress Notes (Signed)
[facility] MD OP Progress Note  Virtual Visit via Telephone Note  I connected with Haley Patton on 06/05/19 at 11:00 AM EST by telephone and verified that I am speaking with the correct person using two identifiers.  I discussed the limitations, risks, security and privacy concerns of performing an evaluation and management service by telephone and the availability of in person appointments. I also discussed with the patient that there may be a patient responsible charge related to this service. The patient expressed understanding and agreed to proceed.   06/05/2019 11:01 AM Haley Patton  MRN:  [id number]  Chief Complaint:  " I am not doing well."  HPI: Patient was noted to be quite talkative during the session.  She described in great detail how she was stressed due to some of the ongoing issues in her life.  At times she was hard to follow and she informed that her younger son is being mean towards her and that is really hurtful to her.  She ruminated about things that happened in the past.  She stated that she does not think the medicines really help her much but she has tried so many that she does not know if there is any option left for her.  She requested if she could be given a few tablets of lorazepam or clonazepam to help her with excessive anxiety.  She stated that she acknowledges she has history of addiction and she does not want to be hooked up to any controlled substances but really would benefit from having something that she can take if she really needs it.  She acknowledged smoking marijuana but then stated that it does not really help much anyways. She denied any suicidal or homicidal ideations.  She acknowledged having some symptoms in the past but denied any intention to do anything at this point. She has a better if we knew of her place where she could live like a residential assisted living facility.  She states she knows that she is too young so she probably would not qualify for  a place like that.  Visit Diagnosis:    ICD-10-CM   1. Mood disorder in conditions classified elsewhere  F06.30   2. PTSD (post-traumatic stress disorder)  F43.10     Past Psychiatric History: PTSD, mood d/o  Past Medical History:  Past Medical History:  Diagnosis Date  . Atrial fibrillation ([place])   . CHF (congestive heart failure) ([place])   . Chronic left flank pain   . COPD (chronic obstructive pulmonary disease) ([place])   . DJD (degenerative joint disease)   . High cholesterol   . PTSD (post-traumatic stress disorder)     Past Surgical History:  Procedure Laterality Date  . ABDOMINAL HYSTERECTOMY    . CERVICAL SPINE SURGERY    . IR KYPHO THORACIC WITH BONE BIOPSY  11/27/2018  . VALVE REPLACEMENT      Family Psychiatric History: Mom- bipolar d/o  Family History:  Family History  Problem Relation Age of Onset  . Bipolar disorder Mother     Social History:  Social History   Socioeconomic History  . Marital status: Legally Separated    Spouse name: Not on file  . Number of children: Not on file  . Years of education: Not on file  . Highest education level: Not on file  Occupational History  . Not on file  Tobacco Use  . Smoking status: Current Every Day Smoker    Packs/day: 1.50  Types: Cigarettes  . Smokeless tobacco: Never Used  Substance and Sexual Activity  . Alcohol use: Never  . Drug use: Never  . Sexual activity: Not on file  Other Topics Concern  . Not on file  Social History Narrative  . Not on file   Social Determinants of Health   Financial Resource Strain:   . Difficulty of Paying Living Expenses: Not on file  Food Insecurity:   . Worried About Charity fundraiser in the Last Year: Not on file  . Ran Out of Food in the Last Year: Not on file  Transportation Needs:   . Lack of Transportation (Medical): Not on file  . Lack of Transportation (Non-Medical): Not on file  Physical Activity:   . Days of Exercise per Week: Not on file  .  Minutes of Exercise per Session: Not on file  Stress:   . Feeling of Stress : Not on file  Social Connections:   . Frequency of Communication with Friends and Family: Not on file  . Frequency of Social Gatherings with Friends and Family: Not on file  . Attends Religious Services: Not on file  . Active Member of Clubs or Organizations: Not on file  . Attends Archivist Meetings: Not on file  . Marital Status: Not on file    Allergies:  Allergies  Allergen Reactions  . Lithium     Vomiting   . Tape Other (See Comments)    blisters  . Tobacco [Tobacco] Other (See Comments)    Increasing lung deterioration     Metabolic Disorder Labs: Lab Results  Component Value Date   HGBA1C 5.3 11/30/2018   MPG 105.41 11/30/2018   No results found for: PROLACTIN Lab Results  Component Value Date   CHOL 178 11/30/2018   TRIG 95 11/30/2018   HDL 62 11/30/2018   CHOLHDL 2.9 11/30/2018   VLDL 19 11/30/2018   LDLCALC 97 11/30/2018   Lab Results  Component Value Date   TSH 1.889 11/30/2018    Therapeutic Level Labs: No results found for: LITHIUM No results found for: VALPROATE No components found for:  CBMZ  Current Medications: Current Outpatient Medications  Medication Sig Dispense Refill  . albuterol (VENTOLIN HFA) 108 (90 Base) MCG/ACT inhaler Inhale 2 puffs into the lungs every 4 (four) hours as needed for wheezing or shortness of breath.    Marland Kitchen aspirin EC 81 MG tablet Take 81 mg by mouth daily.    . cetirizine (ZYRTEC) 10 MG tablet Take 10 mg by mouth daily.    . fluticasone (FLONASE) 50 MCG/ACT nasal spray Place 1 spray into both nostrils daily.    . furosemide (LASIX) 40 MG tablet Take 40 mg by mouth daily.    . hydrOXYzine (ATARAX/VISTARIL) 25 MG tablet Take 1 tablet (25 mg total) by mouth daily as needed for anxiety. 30 tablet 1  . Ipratropium-Albuterol (COMBIVENT RESPIMAT) 20-100 MCG/ACT AERS respimat Inhale 1 puff into the lungs every 6 (six) hours.    Marland Kitchen  lamoTRIgine (LAMICTAL) 25 MG tablet Take 3 tablets (75 mg total) by mouth daily. 270 tablet 1  . metoprolol succinate (TOPROL-XL) 25 MG 24 hr tablet Take 25 mg by mouth daily.    Marland Kitchen omeprazole (PRILOSEC) 40 MG capsule Take 40 mg by mouth daily.    Marland Kitchen OZEMPIC, 0.25 OR 0.5 MG/DOSE, 2 MG/1.5ML SOPN INJECT 0.25 MG SUBCUTANEOUSLY ONCE A WEEK FOR 4 WEEKS THEN INCREASE TO 0.5 MG WEEKLY    . pravastatin (PRAVACHOL)  40 MG tablet Take 40 mg by mouth at bedtime.     . pregabalin (LYRICA) 150 MG capsule Take 150 mg by mouth 3 (three) times daily.    . psyllium (METAMUCIL) 58.6 % powder Take 1 packet by mouth daily.    . traMADol (ULTRAM) 50 MG tablet Take 50 mg by mouth 2 (two) times daily as needed.    . warfarin (COUMADIN) 2.5 MG tablet Take 3.75 mg by mouth daily.      No current facility-administered medications for this visit.       Psychiatric Specialty Exam: Review of Systems  There were no vitals taken for this visit.There is no height or weight on file to calculate BMI.  General Appearance: unable to assess due to phone visit  Eye Contact:  unable to assess due to phone visit  Speech:  Normal Rate and talkative  Volume:  Normal  Mood:  Depressed  Affect:  Congruent  Thought Process:  Descriptions of Associations: Circumstantial  Orientation:  Full (Time, Place, and Person)  Thought Content: Logical and Rumination   Suicidal Thoughts:  No  Homicidal Thoughts:  No  Memory:  Recent;   Good Remote;   Good  Judgement:  Fair  Insight:  Fair  Psychomotor Activity:  Normal  Concentration:  Concentration: Good and Attention Span: Fair  Recall:  Good  Fund of Knowledge: Good  Language: Good  Akathisia:  Negative  Handed:  Right  AIMS (if indicated): not done  Assets:  Communication Skills Desire for Improvement Financial Resources/Insurance Housing  ADL's:  Intact  Cognition: WNL  Sleep:  Fair    Assessment and Plan: Patient continues to have anxiety and is under a lot of stress  due to recent events.  She requested a few tablets of lorazepam or clonazepam for breakthrough anxiety.  She was provided with 10 tablets of lorazepam with no refills.  1. Mood disorder in conditions classified elsewhere  - LORazepam (ATIVAN) 0.5 MG tablet; Take 1 tablet (0.5 mg total) by mouth daily as needed for anxiety.  Dispense: 10 tablet; Refill: 0 - hydrOXYzine (ATARAX/VISTARIL) 25 MG tablet; Take 1 tablet (25 mg total) by mouth daily as needed for anxiety.  Dispense: 30 tablet; Refill: 0 - lamoTRIgine (LAMICTAL) 25 MG tablet; Take 3 tablets (75 mg total) by mouth daily.  Dispense: 270 tablet; Refill: 0  2. PTSD (post-traumatic stress disorder)  - lamoTRIgine (LAMICTAL) 25 MG tablet; Take 3 tablets (75 mg total) by mouth daily.  Dispense: 270 tablet; Refill: 0  We will have office reschedule her an appointment with therapist. Follow-up with Dr. Modesta Messing in 2 months.  [place] Crane, MD 06/05/2019, 11:01 AM

## 2019-06-06 ENCOUNTER — Emergency Department (HOSPITAL_COMMUNITY)
Admission: EM | Admit: 2019-06-06 | Discharge: 2019-06-06 | Disposition: A | Discharge status: Home / Self Care | Payer: Medicare Other | Attending: Emergency Medicine | Admitting: Emergency Medicine

## 2019-06-06 ENCOUNTER — Other Ambulatory Visit: Payer: Self-pay

## 2019-06-06 ENCOUNTER — Encounter (HOSPITAL_COMMUNITY): Payer: Self-pay | Admitting: Emergency Medicine

## 2019-06-06 ENCOUNTER — Emergency Department (HOSPITAL_COMMUNITY): Payer: Medicare Other

## 2019-06-06 DIAGNOSIS — R109 Unspecified abdominal pain: Secondary | ICD-10-CM | POA: Diagnosis not present

## 2019-06-06 DIAGNOSIS — Z7982 Long term (current) use of aspirin: Secondary | ICD-10-CM | POA: Insufficient documentation

## 2019-06-06 DIAGNOSIS — F17210 Nicotine dependence, cigarettes, uncomplicated: Secondary | ICD-10-CM | POA: Insufficient documentation

## 2019-06-06 DIAGNOSIS — J449 Chronic obstructive pulmonary disease, unspecified: Secondary | ICD-10-CM | POA: Diagnosis not present

## 2019-06-06 DIAGNOSIS — R319 Hematuria, unspecified: Secondary | ICD-10-CM | POA: Diagnosis not present

## 2019-06-06 DIAGNOSIS — Z7901 Long term (current) use of anticoagulants: Secondary | ICD-10-CM | POA: Insufficient documentation

## 2019-06-06 DIAGNOSIS — I480 Paroxysmal atrial fibrillation: Secondary | ICD-10-CM | POA: Diagnosis not present

## 2019-06-06 DIAGNOSIS — Z79899 Other long term (current) drug therapy: Secondary | ICD-10-CM | POA: Diagnosis not present

## 2019-06-06 DIAGNOSIS — R1032 Left lower quadrant pain: Secondary | ICD-10-CM | POA: Diagnosis not present

## 2019-06-06 LAB — CBC
HCT: 46.7 % — ABNORMAL HIGH (ref 36.0–46.0)
Hemoglobin: 15.5 g/dL — ABNORMAL HIGH (ref 12.0–15.0)
MCH: 30.5 pg (ref 26.0–34.0)
MCHC: 33.2 g/dL (ref 30.0–36.0)
MCV: 91.9 fL (ref 80.0–100.0)
Platelets: 281 10*3/uL (ref 150–400)
RBC: 5.08 MIL/uL (ref 3.87–5.11)
RDW: 13.4 % (ref 11.5–15.5)
WBC: 6.3 10*3/uL (ref 4.0–10.5)
nRBC: 0 % (ref 0.0–0.2)

## 2019-06-06 LAB — URINALYSIS, ROUTINE W REFLEX MICROSCOPIC
Bilirubin Urine: NEGATIVE
Glucose, UA: NEGATIVE mg/dL
Hgb urine dipstick: NEGATIVE
Ketones, ur: NEGATIVE mg/dL
Leukocytes,Ua: NEGATIVE
Nitrite: POSITIVE — AB
Protein, ur: NEGATIVE mg/dL
Specific Gravity, Urine: 1.023 (ref 1.005–1.030)
pH: 5 (ref 5.0–8.0)

## 2019-06-06 LAB — BASIC METABOLIC PANEL
Anion gap: 10 (ref 5–15)
BUN: 17 mg/dL (ref 6–20)
CO2: 22 mmol/L (ref 22–32)
Calcium: 9 mg/dL (ref 8.9–10.3)
Chloride: 105 mmol/L (ref 98–111)
Creatinine, Ser: 1.07 mg/dL — ABNORMAL HIGH (ref 0.44–1.00)
GFR calc Af Amer: >60 mL/min (ref >60)
GFR calc non Af Amer: 60 mL/min — ABNORMAL LOW (ref >60)
Glucose, Bld: 80 mg/dL (ref 70–99)
Potassium: 4 mmol/L (ref 3.5–5.1)
Sodium: 137 mmol/L (ref 135–145)

## 2019-06-06 LAB — DIFFERENTIAL
Abs Immature Granulocytes: 0.01 10*3/uL (ref 0.00–0.07)
Basophils Absolute: 0 10*3/uL (ref 0.0–0.1)
Basophils Relative: 1 %
Eosinophils Absolute: 0.2 10*3/uL (ref 0.0–0.5)
Eosinophils Relative: 3 %
Immature Granulocytes: 0 %
Lymphocytes Relative: 45 %
Lymphs Abs: 2.8 10*3/uL (ref 0.7–4.0)
Monocytes Absolute: 0.4 10*3/uL (ref 0.1–1.0)
Monocytes Relative: 7 %
Neutro Abs: 2.7 10*3/uL (ref 1.7–7.7)
Neutrophils Relative %: 44 %

## 2019-06-06 LAB — HEPATIC FUNCTION PANEL
ALT: 16 U/L (ref 0–44)
AST: 18 U/L (ref 15–41)
Albumin: 4 g/dL (ref 3.5–5.0)
Alkaline Phosphatase: 53 U/L (ref 38–126)
Bilirubin, Direct: 0.1 mg/dL (ref 0.0–0.2)
Indirect Bilirubin: 1.1 mg/dL — ABNORMAL HIGH (ref 0.3–0.9)
Total Bilirubin: 1.2 mg/dL (ref 0.3–1.2)
Total Protein: 7.2 g/dL (ref 6.5–8.1)

## 2019-06-06 LAB — PROTIME-INR
INR: 2.4 — ABNORMAL HIGH (ref 0.8–1.2)
Prothrombin Time: 25.8 seconds — ABNORMAL HIGH (ref 11.4–15.2)

## 2019-06-06 MED ORDER — SODIUM CHLORIDE 0.9 % IV BOLUS
500.0000 mL | Freq: Once | INTRAVENOUS | Status: AC
  Administered 2019-06-06: 13:00:00 500 mL via INTRAVENOUS

## 2019-06-06 MED ORDER — HYDROMORPHONE HCL 1 MG/ML IJ SOLN
0.5000 mg | Freq: Once | INTRAMUSCULAR | Status: AC
  Administered 2019-06-06: 13:00:00 0.5 mg via INTRAVENOUS
  Filled 2019-06-06: qty 1

## 2019-06-06 MED ORDER — ONDANSETRON HCL 4 MG/2ML IJ SOLN
4.0000 mg | Freq: Once | INTRAMUSCULAR | Status: AC
  Administered 2019-06-06: 13:00:00 4 mg via INTRAVENOUS
  Filled 2019-06-06: qty 2

## 2019-06-06 MED ORDER — HYDROCODONE-ACETAMINOPHEN 5-325 MG PO TABS: 1.0000 | ORAL_TABLET | Freq: Four times a day (QID) | ORAL | 0 refills | Status: DC | PRN

## 2019-06-06 NOTE — ED Triage Notes (Signed)
Patient complains of left side flank pain x3 days. Chills reported last night and the patient took tylenol without taking temp.

## 2019-06-06 NOTE — ED Provider Notes (Signed)
[facility] EMERGENCY DEPARTMENT Provider Note   CSN: [id number] Arrival date & time: 06/06/19  1155     History Chief Complaint  Patient presents with  . Flank Pain    Haley Patton is a 54 y.o. female.  Patient complains of left flank pain.  Patient did have a kidney stone before.  She also states that she saw some blood in her urine.  Patient is on Coumadin  The history is provided by the patient. No language interpreter was used.  Flank Pain This is a new problem. The current episode started yesterday. The problem occurs constantly. The problem has not changed since onset.Pertinent negatives include no chest pain, no abdominal pain and no headaches. Nothing aggravates the symptoms. Nothing relieves the symptoms. She has tried nothing for the symptoms.       Past Medical History:  Diagnosis Date  . Atrial fibrillation ([place])   . CHF (congestive heart failure) ([place])   . Chronic left flank pain   . COPD (chronic obstructive pulmonary disease) ([place])   . DJD (degenerative joint disease)   . High cholesterol   . PTSD (post-traumatic stress disorder)     Patient Active Problem List   Diagnosis Date Noted  . Mood disorder in conditions classified elsewhere 06/29/2018  . PTSD (post-traumatic stress disorder) 06/29/2018  . LUQ pain 05/10/2018  . Flank pain 05/10/2018    Past Surgical History:  Procedure Laterality Date  . ABDOMINAL HYSTERECTOMY    . CERVICAL SPINE SURGERY    . IR KYPHO THORACIC WITH BONE BIOPSY  11/27/2018  . VALVE REPLACEMENT       OB History   No obstetric history on file.     Family History  Problem Relation Age of Onset  . Bipolar disorder Mother     Social History   Tobacco Use  . Smoking status: Current Every Day Smoker    Packs/day: 1.50    Types: Cigarettes  . Smokeless tobacco: Never Used  Substance Use Topics  . Alcohol use: Never  . Drug use: Never    Home Medications Prior to Admission medications   Medication Sig  Start Date End Date Taking? Authorizing Provider  albuterol (VENTOLIN HFA) 108 (90 Base) MCG/ACT inhaler Inhale 2 puffs into the lungs every 4 (four) hours as needed for wheezing or shortness of breath.    [provider]  aspirin EC 81 MG tablet Take 81 mg by mouth daily.    [provider]  cetirizine (ZYRTEC) 10 MG tablet Take 10 mg by mouth daily.    [provider]  fluticasone (FLONASE) 50 MCG/ACT nasal spray Place 1 spray into both nostrils daily.    [provider]  furosemide (LASIX) 40 MG tablet Take 40 mg by mouth daily.    [provider]  hydrOXYzine (ATARAX/VISTARIL) 25 MG tablet Take 1 tablet (25 mg total) by mouth daily as needed for anxiety. 06/05/19   [place] Crane, MD  Ipratropium-Albuterol (COMBIVENT RESPIMAT) 20-100 MCG/ACT AERS respimat Inhale 1 puff into the lungs every 6 (six) hours.    [provider]  lamoTRIgine (LAMICTAL) 25 MG tablet Take 3 tablets (75 mg total) by mouth daily. 06/05/19   [place] Crane, MD  LORazepam (ATIVAN) 0.5 MG tablet Take 1 tablet (0.5 mg total) by mouth daily as needed for anxiety. 06/05/19   [place] Crane, MD  metoprolol succinate (TOPROL-XL) 25 MG 24 hr tablet Take 25 mg by mouth daily. 02/01/19   [provider]  omeprazole (  PRILOSEC) 40 MG capsule Take 40 mg by mouth daily.    [provider]  OZEMPIC, 0.25 OR 0.5 MG/DOSE, 2 MG/1.5ML SOPN INJECT 0.25 MG SUBCUTANEOUSLY ONCE A WEEK FOR 4 WEEKS THEN INCREASE TO 0.5 MG WEEKLY 01/24/19   [provider]  pravastatin (PRAVACHOL) 40 MG tablet Take 40 mg by mouth at bedtime.     [provider]  pregabalin (LYRICA) 150 MG capsule Take 150 mg by mouth 3 (three) times daily.    [provider]  psyllium (METAMUCIL) 58.6 % powder Take 1 packet by mouth daily.    [provider]  traMADol (ULTRAM) 50 MG tablet Take 50 mg by mouth 2 (two) times daily as needed.    [provider]  warfarin  (COUMADIN) 2.5 MG tablet Take 3.75 mg by mouth daily.     [provider]    Allergies    Lithium, Tape, and Tobacco [tobacco]  Review of Systems   Review of Systems  Constitutional: Negative for appetite change and fatigue.  HENT: Negative for congestion, ear discharge and sinus pressure.   Eyes: Negative for discharge.  Respiratory: Negative for cough.   Cardiovascular: Negative for chest pain.  Gastrointestinal: Negative for abdominal pain and diarrhea.  Genitourinary: Positive for flank pain. Negative for frequency and hematuria.  Musculoskeletal: Negative for back pain.  Skin: Negative for rash.  Neurological: Negative for seizures and headaches.  Psychiatric/Behavioral: Negative for hallucinations.    Physical Exam Updated Vital Signs BP 98/79 (BP Location: Right Arm)   Pulse 83   Temp 98.5 F (36.9 C) (Oral)   Resp 16   Ht 5\' 4"  (1.626 m)   Wt 93 kg   SpO2 97%   BMI 35.19 kg/m   Physical Exam Vitals and nursing note reviewed.  Constitutional:      Appearance: She is well-developed.  HENT:     Head: Normocephalic.     Nose: Nose normal.  Eyes:     General: No scleral icterus.    Conjunctiva/sclera: Conjunctivae normal.  Neck:     Thyroid: No thyromegaly.  Cardiovascular:     Rate and Rhythm: Normal rate and regular rhythm.     Heart sounds: No murmur. No friction rub. No gallop.   Pulmonary:     Breath sounds: No stridor. No wheezing or rales.  Chest:     Chest wall: No tenderness.  Abdominal:     General: There is no distension.     Tenderness: There is no abdominal tenderness. There is no rebound.  Musculoskeletal:        General: Normal range of motion.     Cervical back: Neck supple.  Lymphadenopathy:     Cervical: No cervical adenopathy.  Skin:    Findings: No erythema or rash.  Neurological:     Mental Status: She is alert and oriented to person, place, and time.     Motor: No abnormal muscle tone.     Coordination: Coordination  normal.  Psychiatric:        Behavior: Behavior normal.     ED Results / Procedures / Treatments   Labs (all labs ordered are listed, but only abnormal results are displayed) Labs Reviewed  BASIC METABOLIC PANEL - Abnormal; Notable for the following components:      Result Value   Creatinine, Ser 1.07 (*)    GFR calc non Af Amer 60 (*)    All other components within normal limits  CBC - Abnormal; Notable for  the following components:   Hemoglobin 15.5 (*)    HCT 46.7 (*)    All other components within normal limits  PROTIME-INR - Abnormal; Notable for the following components:   Prothrombin Time 25.8 (*)    INR 2.4 (*)    All other components within normal limits  HEPATIC FUNCTION PANEL - Abnormal; Notable for the following components:   Indirect Bilirubin 1.1 (*)    All other components within normal limits  URINE CULTURE  DIFFERENTIAL  URINALYSIS, ROUTINE W REFLEX MICROSCOPIC    EKG None  Radiology CT Renal Stone Study  Result Date: 06/06/2019 CLINICAL DATA:  Left-sided abdominal pain 1 week with hematuria and clots. EXAM: CT ABDOMEN AND PELVIS WITHOUT CONTRAST TECHNIQUE: Multidetector CT imaging of the abdomen and pelvis was performed following the standard protocol without IV contrast. COMPARISON:  08/28/2018 FINDINGS: Lower chest: Minimal scarring over the left lung base. 4 mm nodular density over the lateral right lower lobe unchanged. Calcification of the mitral valve annulus. Sternotomy wires present. Minimal calcified plaque over the descending thoracic aorta. Hepatobiliary: Mild prominence of the gallbladder which is otherwise unremarkable. Liver and biliary tree are normal. Pancreas: Normal. Spleen: Normal. Adrenals/Urinary Tract: Adrenal glands are normal. Kidneys are normal in size without hydronephrosis or nephrolithiasis. Subcentimeter hypodensity over the mid pole right kidney too small to characterize but likely a cyst and unchanged. Ureters and bladder are  normal. Stomach/Bowel: Stomach and small bowel are normal. Appendix is normal. Colon is unremarkable. Vascular/Lymphatic: Mild calcified plaque over the abdominal aorta which is normal in caliber. No adenopathy. Reproductive: Previous hysterectomy. Other: No free fluid or focal inflammatory change. Musculoskeletal: Mild T11 compression fracture unchanged. IMPRESSION: 1. No acute findings in the abdomen/pelvis. No evidence of renal/ureteral stones or obstruction. 2. Stable 4 mm nodular density over the right lower lobe. Recommend 1 follow-up noncontrast chest CT in 1 year. This recommendation follows the consensus statement: Guidelines for Management of Small Pulmonary Nodules Detected on CT Scans: A Statement from the [place] as published in Radiology 2005; 237:395-400. Online at: https://www.arnold.com/. 3. Subcentimeter hypodensity over the right renal mid pole likely a cyst but too small to characterize. 4.  Aortic Atherosclerosis (ICD10-I70.0). 5.  Stable mild T11 compression fracture. Electronically Signed   By: Marin Olp M.D.   On: 06/06/2019 14:28    Procedures Procedures (including critical care time)  Medications Ordered in ED Medications  sodium chloride 0.9 % bolus 500 mL (0 mLs Intravenous Stopped 06/06/19 1443)  HYDROmorphone (DILAUDID) injection 0.5 mg (0.5 mg Intravenous Given 06/06/19 1246)  ondansetron (ZOFRAN) injection 4 mg (4 mg Intravenous Given 06/06/19 1247)    ED Course  I have reviewed the triage vital signs and the nursing notes.  Pertinent labs & imaging results that were available during my care of the patient were reviewed by me and considered in my medical decision making (see chart for details).    MDM Rules/Calculators/A&P                     Patient with left-sided flank pain.  CT scan unremarkable.,   Urinalysis unremarkable.  Patient given pain medicine she will follow-up with a primary care doctor for her persistent  back pain which is most likely musculoskeletal or she could have passed a kidney stone does not seem Final Clinical Impression(s) / ED Diagnoses Final diagnoses:  None    Rx / DC Orders ED Discharge Orders    None  Milton Ferguson, MD 06/07/19 1100

## 2019-06-06 NOTE — Discharge Instructions (Addendum)
Follow up with your md next week. °

## 2019-06-08 LAB — URINE CULTURE: Special Requests: NORMAL

## 2019-06-10 DIAGNOSIS — Z6835 Body mass index (BMI) 35.0-35.9, adult: Secondary | ICD-10-CM | POA: Diagnosis not present

## 2019-06-10 DIAGNOSIS — I480 Paroxysmal atrial fibrillation: Secondary | ICD-10-CM | POA: Diagnosis not present

## 2019-06-10 DIAGNOSIS — Z720 Tobacco use: Secondary | ICD-10-CM | POA: Diagnosis not present

## 2019-06-10 DIAGNOSIS — Z952 Presence of prosthetic heart valve: Secondary | ICD-10-CM | POA: Diagnosis not present

## 2019-06-22 DIAGNOSIS — K219 Gastro-esophageal reflux disease without esophagitis: Secondary | ICD-10-CM | POA: Diagnosis not present

## 2019-06-22 DIAGNOSIS — Z8673 Personal history of transient ischemic attack (TIA), and cerebral infarction without residual deficits: Secondary | ICD-10-CM | POA: Diagnosis not present

## 2019-06-22 DIAGNOSIS — J449 Chronic obstructive pulmonary disease, unspecified: Secondary | ICD-10-CM | POA: Diagnosis not present

## 2019-06-22 DIAGNOSIS — Z7982 Long term (current) use of aspirin: Secondary | ICD-10-CM | POA: Diagnosis not present

## 2019-06-22 DIAGNOSIS — F17200 Nicotine dependence, unspecified, uncomplicated: Secondary | ICD-10-CM | POA: Diagnosis not present

## 2019-06-22 DIAGNOSIS — I4891 Unspecified atrial fibrillation: Secondary | ICD-10-CM | POA: Diagnosis not present

## 2019-06-22 DIAGNOSIS — Z79899 Other long term (current) drug therapy: Secondary | ICD-10-CM | POA: Diagnosis not present

## 2019-06-22 DIAGNOSIS — F419 Anxiety disorder, unspecified: Secondary | ICD-10-CM | POA: Diagnosis not present

## 2019-06-22 DIAGNOSIS — Z7901 Long term (current) use of anticoagulants: Secondary | ICD-10-CM | POA: Diagnosis not present

## 2019-06-22 DIAGNOSIS — R079 Chest pain, unspecified: Secondary | ICD-10-CM | POA: Diagnosis not present

## 2019-06-22 DIAGNOSIS — F319 Bipolar disorder, unspecified: Secondary | ICD-10-CM | POA: Diagnosis not present

## 2019-06-22 DIAGNOSIS — Z888 Allergy status to other drugs, medicaments and biological substances status: Secondary | ICD-10-CM | POA: Diagnosis not present

## 2019-07-02 ENCOUNTER — Other Ambulatory Visit: Payer: Self-pay

## 2019-07-02 ENCOUNTER — Ambulatory Visit (INDEPENDENT_AMBULATORY_CARE_PROVIDER_SITE_OTHER): Payer: Medicare Other | Admitting: Clinical

## 2019-07-02 DIAGNOSIS — F331 Major depressive disorder, recurrent, moderate: Secondary | ICD-10-CM

## 2019-07-02 DIAGNOSIS — F4310 Post-traumatic stress disorder, unspecified: Secondary | ICD-10-CM

## 2019-07-02 DIAGNOSIS — F419 Anxiety disorder, unspecified: Secondary | ICD-10-CM
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
## 2019-07-07 DIAGNOSIS — Z7901 Long term (current) use of anticoagulants: Secondary | ICD-10-CM | POA: Diagnosis not present

## 2019-07-07 DIAGNOSIS — I480 Paroxysmal atrial fibrillation: Secondary | ICD-10-CM | POA: Diagnosis not present
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
## 2019-07-19 ENCOUNTER — Ambulatory Visit (INDEPENDENT_AMBULATORY_CARE_PROVIDER_SITE_OTHER): Payer: Medicare Other | Admitting: Psychiatry

## 2019-07-19 ENCOUNTER — Encounter (HOSPITAL_COMMUNITY): Payer: Self-pay | Admitting: Psychiatry

## 2019-07-19 ENCOUNTER — Other Ambulatory Visit: Payer: Self-pay

## 2019-07-19 DIAGNOSIS — F4310 Post-traumatic stress disorder, unspecified: Secondary | ICD-10-CM | POA: Diagnosis not present

## 2019-07-19 DIAGNOSIS — F331 Major depressive disorder, recurrent, moderate: Secondary | ICD-10-CM

## 2019-07-19 DIAGNOSIS — F419 Anxiety disorder, unspecified: Secondary | ICD-10-CM

## 2019-07-19 MED ORDER — ESCITALOPRAM OXALATE 10 MG PO TABS: ORAL_TABLET | ORAL | 1 refills | Status: DC

## 2019-07-19 NOTE — Patient Instructions (Addendum)
1.Continuelamotrigine75mg  daily 2. Start lexapro 5 mg daily for one week, then 15 mg daily  3. Continue hydroxyzine 25 mg daily as needed for anxiety  4. Next appointment: 4/26 at 11:20  CONTACT INFORMATION  What to do if you need to get in touch with someone regarding a psychiatric issue:  1. EMERGENCY: For psychiatric emergencies (if you are suicidal or if there are any other safety issues) call 911 and/or go to your nearest Emergency Room immediately.   2. IF YOU NEED SOMEONE TO TALK TO RIGHT NOW: Given my clinical responsibilities, I may not be able to speak with you over the phone for a prolonged period of time.  a. You may always call The National Suicide Prevention Lifeline at 1-800-273-TALK 9408811858).  b. Your county of residence will also have local crisis services. For [facility]: [place] at [phone] ([place])

## 2019-08-02 ENCOUNTER — Other Ambulatory Visit: Payer: Self-pay

## 2019-08-02 ENCOUNTER — Ambulatory Visit (INDEPENDENT_AMBULATORY_CARE_PROVIDER_SITE_OTHER): Payer: Medicare Other | Admitting: Clinical

## 2019-08-02 DIAGNOSIS — F331 Major depressive disorder, recurrent, moderate: Secondary | ICD-10-CM | POA: Diagnosis not present

## 2019-08-02 DIAGNOSIS — F419 Anxiety disorder, unspecified: Secondary | ICD-10-CM

## 2019-08-02 DIAGNOSIS — F4310 Post-traumatic stress disorder, unspecified: Secondary | ICD-10-CM | POA: Diagnosis not present

## 2019-08-02 NOTE — Progress Notes (Addendum)
  Virtual Visit via Telephone Note  I connected with Haley Patton on 08/02/19 at 10:00 AM EDT by telephone and verified that I am speaking with the correct person using two identifiers.  Location: Patient: Home Provider: Office    I discussed the limitations, risks, security and privacy concerns of performing an evaluation and management service by telephone and the availability of in person appointments. I also discussed with the patient that there may be a patient responsible charge related to this service. The patient expressed understanding and agreed to proceed.     THERAPIST PROGRESS NOTE  Session Time: 10:00AM-10:55AM  Participation Level: Active  Behavioral Response: CasualAlertAngry and Irritable  Type of Therapy: Individual Therapy  Treatment Goals addressed: Anger  Interventions: CBT and Strength-based  Summary: Haley Patton is a 54 y.o. female who presents with PTSD and Depression. The OPT therapist worked with the patient for her initial session. The OPT therapist utilized Motivational Interviewing to assist in creating therapeutic repore. The patient in the session was engaged and work in collaboration giving feedback about her triggers and symptoms over the past few weeks. The OPT therapist utilized Cognitive Behavioral Therapy through cognitive restructuring as well as worked with the patient on coping strategies to assist in management of PTSD and mood. The OPT therapist inquired for holistic care about the patients adherence to medication therapy.  Suicidal/Homicidal: Nowithout intent/plan  Therapist Response: The OPT therapist worked with the patient for the patients initial scheduled session. The patient was engaged in her session and gave feedback in relation to triggers, symptoms, and behavior responses over the past few weeks. The OPT therapist worked with the patient utilizing an in session Cognitive Behavioral Therapy exercise. The patient was responsive  in the session and verbalized, " I dont understand other people they dont like me I talk to much, I dont want to be like them ". The patient indicated compliance, but concern with effectiveness in relation to her current medication therapy. The OPT therapist will continue treatment work with the patient in her next scheduled session.  Plan: Return again in 3 weeks.  Diagnosis: Axis I: PTSD (post-traumatic stress disorder) and Recurrent moderate major depressive disorder with anxiety    Axis II: No diagnosis  I discussed the assessment and treatment plan with the patient. The patient was provided an opportunity to ask questions and all were answered. The patient agreed with the plan and demonstrated an understanding of the instructions.   The patient was advised to call back or seek an in-person evaluation if the symptoms worsen or if the condition fails to improve as anticipated.  I provided 55 minutes of non-face-to-face time during this encounter.  Lennox Grumbles, LCSW 08/02/2019

## 2019-08-05 ENCOUNTER — Ambulatory Visit (HOSPITAL_COMMUNITY): Payer: Medicare Other | Admitting: Psychiatry

## 2019-08-06 ENCOUNTER — Ambulatory Visit: Payer: Medicare Other | Admitting: Family Medicine

## 2019-08-08 DIAGNOSIS — Z7901 Long term (current) use of anticoagulants: Secondary | ICD-10-CM | POA: Diagnosis not present

## 2019-08-08 DIAGNOSIS — I480 Paroxysmal atrial fibrillation: Secondary | ICD-10-CM | POA: Diagnosis not present

## 2019-08-20 ENCOUNTER — Telehealth: Payer: Self-pay | Admitting: Family Medicine

## 2019-08-20 NOTE — Telephone Encounter (Signed)
Left detailed message.   

## 2019-08-20 NOTE — Telephone Encounter (Signed)
Pt called back to get update on whether Haley Patton will see her for telephone or video visit tomorrow for new patient appt since she can't come in due to having ear pain. Says she really needs to keep this appt because she is about to run out of all of her medications. Wants to be called back asap.

## 2019-08-20 NOTE — Telephone Encounter (Signed)
We can do telephone visit.

## 2019-08-21 ENCOUNTER — Encounter: Payer: Self-pay | Admitting: Family Medicine

## 2019-08-21 ENCOUNTER — Ambulatory Visit (INDEPENDENT_AMBULATORY_CARE_PROVIDER_SITE_OTHER): Payer: Medicare Other | Admitting: Family Medicine

## 2019-08-21 DIAGNOSIS — J301 Allergic rhinitis due to pollen: Secondary | ICD-10-CM

## 2019-08-21 DIAGNOSIS — J019 Acute sinusitis, unspecified: Secondary | ICD-10-CM | POA: Diagnosis not present

## 2019-08-21 DIAGNOSIS — F0630 Mood disorder due to known physiological condition, unspecified: Secondary | ICD-10-CM

## 2019-08-21 DIAGNOSIS — I052 Rheumatic mitral stenosis with insufficiency: Secondary | ICD-10-CM

## 2019-08-21 DIAGNOSIS — K219 Gastro-esophageal reflux disease without esophagitis: Secondary | ICD-10-CM | POA: Diagnosis not present

## 2019-08-21 DIAGNOSIS — J449 Chronic obstructive pulmonary disease, unspecified: Secondary | ICD-10-CM | POA: Diagnosis not present

## 2019-08-21 DIAGNOSIS — F4310 Post-traumatic stress disorder, unspecified: Secondary | ICD-10-CM

## 2019-08-21 DIAGNOSIS — F314 Bipolar disorder, current episode depressed, severe, without psychotic features: Secondary | ICD-10-CM | POA: Diagnosis not present

## 2019-08-21 DIAGNOSIS — I5032 Chronic diastolic (congestive) heart failure: Secondary | ICD-10-CM | POA: Diagnosis not present

## 2019-08-21 DIAGNOSIS — M5137 Other intervertebral disc degeneration, lumbosacral region: Secondary | ICD-10-CM | POA: Insufficient documentation

## 2019-08-21 HISTORY — DX: Gastro-esophageal reflux disease without esophagitis: K21.9

## 2019-08-21 MED ORDER — PREGABALIN 150 MG PO CAPS: 150.0000 mg | ORAL_CAPSULE | Freq: Three times a day (TID) | ORAL | 2 refills | Status: DC

## 2019-08-21 MED ORDER — AMOXICILLIN-POT CLAVULANATE 875-125 MG PO TABS: 1.0000 | ORAL_TABLET | Freq: Two times a day (BID) | ORAL | 0 refills | Status: AC

## 2019-08-21 MED ORDER — PREDNISONE 10 MG (21) PO TBPK: ORAL_TABLET | ORAL | 0 refills | Status: AC

## 2019-08-21 MED ORDER — FLUTICASONE PROPIONATE 50 MCG/ACT NA SUSP: 1.0000 | Freq: Every day | NASAL | 5 refills | Status: AC

## 2019-08-21 MED ORDER — OMEPRAZOLE 40 MG PO CPDR: 40.0000 mg | DELAYED_RELEASE_CAPSULE | Freq: Every day | ORAL | 2 refills | Status: AC
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
## 2019-08-26 ENCOUNTER — Ambulatory Visit (INDEPENDENT_AMBULATORY_CARE_PROVIDER_SITE_OTHER): Payer: Medicare Other | Admitting: Clinical

## 2019-08-26 ENCOUNTER — Other Ambulatory Visit: Payer: Self-pay

## 2019-08-26 DIAGNOSIS — F4310 Post-traumatic stress disorder, unspecified: Secondary | ICD-10-CM | POA: Diagnosis not present

## 2019-08-26 DIAGNOSIS — F331 Major depressive disorder, recurrent, moderate: Secondary | ICD-10-CM | POA: Diagnosis not present

## 2019-08-26 DIAGNOSIS — F419 Anxiety disorder, unspecified: Secondary | ICD-10-CM | POA: Diagnosis not present
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
## 2019-08-28 ENCOUNTER — Telehealth: Payer: Self-pay | Admitting: Family Medicine

## 2019-08-29 ENCOUNTER — Other Ambulatory Visit: Payer: Self-pay

## 2019-08-29 MED ORDER — PREGABALIN 150 MG PO CAPS: 150.0000 mg | ORAL_CAPSULE | Freq: Three times a day (TID) | ORAL | 1 refills | Status: DC

## 2019-08-29 MED ORDER — PREGABALIN 150 MG PO CAPS: 150.0000 mg | ORAL_CAPSULE | Freq: Three times a day (TID) | ORAL | 2 refills | Status: DC

## 2019-08-29 MED ORDER — COMBIVENT RESPIMAT 20-100 MCG/ACT IN AERS: 1.0000 | INHALATION_SPRAY | Freq: Four times a day (QID) | RESPIRATORY_TRACT | 1 refills | Status: DC

## 2019-08-29 NOTE — Telephone Encounter (Signed)
Pt is unsure if she is moving to [place] or not. She said to give her to Monday and she will call back and make [place] aware.

## 2019-08-29 NOTE — Telephone Encounter (Signed)
I have sent both prescriptions electronically. I will VOID the printed one. Since she has moved to [place], is she transferring care from here?

## 2019-08-29 NOTE — Telephone Encounter (Signed)
Original message says pt recently moved to [place].

## 2019-08-29 NOTE — Telephone Encounter (Signed)
lmtcb

## 2019-08-29 NOTE — Telephone Encounter (Signed)
Pt made it sound like she was staying at a hotel for a few days

## 2019-08-29 NOTE — Telephone Encounter (Signed)
Lyrica Rx cancelled at [facility] in [place]. New Lyrica Rx printed and placed on Britney's desk to sign and fax to [facility] in [place].  Britney,  Are you alright to send in Combivent?
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
## 2019-09-09 ENCOUNTER — Other Ambulatory Visit: Payer: Self-pay

## 2019-09-09 ENCOUNTER — Encounter (HOSPITAL_COMMUNITY): Payer: Self-pay | Admitting: Psychiatry

## 2019-09-09 ENCOUNTER — Telehealth (INDEPENDENT_AMBULATORY_CARE_PROVIDER_SITE_OTHER): Payer: Medicare Other | Admitting: Psychiatry

## 2019-09-09 DIAGNOSIS — F331 Major depressive disorder, recurrent, moderate: Secondary | ICD-10-CM | POA: Diagnosis not present

## 2019-09-09 DIAGNOSIS — F4310 Post-traumatic stress disorder, unspecified: Secondary | ICD-10-CM | POA: Diagnosis not present

## 2019-09-09 MED ORDER — MIRTAZAPINE 15 MG PO TABS: ORAL_TABLET | ORAL | 1 refills | Status: AC

## 2019-09-09 MED ORDER — LAMOTRIGINE 25 MG PO TABS: 75.0000 mg | ORAL_TABLET | Freq: Every day | ORAL | 0 refills | Status: AC

## 2019-09-09 NOTE — Patient Instructions (Addendum)
1.Continuelamotrigine75mg  daily 2. Start mirtazapine 7.5 mg at night for one week, then 15 mg at night  3.Continuehydroxyzine 25 mg daily as needed for anxiety  4. Next appointment: 6/7 at 11:20

## 2019-09-13 ENCOUNTER — Other Ambulatory Visit: Payer: Self-pay

## 2019-09-13 ENCOUNTER — Ambulatory Visit (INDEPENDENT_AMBULATORY_CARE_PROVIDER_SITE_OTHER): Payer: Medicare Other | Admitting: Clinical

## 2019-09-13 ENCOUNTER — Ambulatory Visit (HOSPITAL_COMMUNITY): Payer: Medicare Other | Admitting: Clinical

## 2019-09-13 DIAGNOSIS — F4310 Post-traumatic stress disorder, unspecified: Secondary | ICD-10-CM

## 2019-09-13 DIAGNOSIS — F419 Anxiety disorder, unspecified: Secondary | ICD-10-CM | POA: Diagnosis not present

## 2019-09-13 DIAGNOSIS — F331 Major depressive disorder, recurrent, moderate: Secondary | ICD-10-CM | POA: Diagnosis not present

## 2019-09-13 DIAGNOSIS — I480 Paroxysmal atrial fibrillation: Secondary | ICD-10-CM | POA: Diagnosis not present
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
## 2019-09-16 ENCOUNTER — Ambulatory Visit (INDEPENDENT_AMBULATORY_CARE_PROVIDER_SITE_OTHER): Payer: Medicare Other | Admitting: Internal Medicine

## 2019-09-25 ENCOUNTER — Ambulatory Visit (HOSPITAL_COMMUNITY): Payer: Medicare Other | Admitting: Clinical

## 2019-09-26 DIAGNOSIS — Z7901 Long term (current) use of anticoagulants: Secondary | ICD-10-CM | POA: Diagnosis not present

## 2019-09-26 DIAGNOSIS — I480 Paroxysmal atrial fibrillation: Secondary | ICD-10-CM | POA: Diagnosis not present
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
## 2019-10-18 DIAGNOSIS — N209 Urinary calculus, unspecified: Secondary | ICD-10-CM | POA: Diagnosis not present

## 2019-10-21 ENCOUNTER — Telehealth (HOSPITAL_COMMUNITY): Payer: Self-pay | Admitting: Psychiatry

## 2019-10-21 ENCOUNTER — Telehealth (HOSPITAL_COMMUNITY): Payer: Medicare Other | Admitting: Psychiatry

## 2019-10-21 ENCOUNTER — Other Ambulatory Visit: Payer: Self-pay

## 2019-10-21 NOTE — Telephone Encounter (Signed)
Called the patient twice for appointment scheduled today. The patient did not answer the phone. No option to leave a voice message.  

## 2019-10-23 ENCOUNTER — Other Ambulatory Visit: Payer: Self-pay | Admitting: Family Medicine

## 2019-10-23 MED ORDER — CETIRIZINE HCL 10 MG PO TABS: 10.0000 mg | ORAL_TABLET | Freq: Every day | ORAL | 1 refills | Status: AC

## 2019-10-23 NOTE — Telephone Encounter (Signed)
°  Prescription Request  10/23/2019  What is the name of the medication or equipment? cetirizine (ZYRTEC) 10 MG tablet    Have you contacted your pharmacy to request a refill? (if applicable) yes  Which pharmacy would you like this sent to? [facility] in [place]    Patient notified that their request is being sent to the clinical staff for review and that they should receive a response within 2 business days.

## 2019-10-24 ENCOUNTER — Telehealth: Payer: Self-pay | Admitting: Family Medicine

## 2019-10-24 NOTE — Telephone Encounter (Signed)
Patient is staying in [place] right now with family will be back in July has appt 11/26/19 with you.

## 2019-10-24 NOTE — Telephone Encounter (Signed)
  Prescription Request  10/24/2019  What is the name of the medication or equipment? pregabalin (LYRICA) 150 MG capsule  Have you contacted your pharmacy to request a refill? (if applicable) yes  Which pharmacy would you like this sent to? [facility] in [place]   Patient notified that their request is being sent to the clinical staff for review and that they should receive a response within 2 business days.

## 2019-10-24 NOTE — Telephone Encounter (Signed)
Patient is just staying with goddaughter until July helping watch kids.

## 2019-10-24 NOTE — Telephone Encounter (Signed)
Responded to in refill encounter.

## 2019-10-29 ENCOUNTER — Inpatient Hospital Stay
Admit: 2019-10-29 | Discharge: 2019-10-29 | Disposition: A | Discharge status: Home / Self Care | Payer: MEDICARE | Attending: Emergency Medicine

## 2019-10-29 ENCOUNTER — Emergency Department: Admit: 2019-10-29 | Payer: MEDICARE | Primary: Family Medicine

## 2019-10-29 DIAGNOSIS — R0789 Other chest pain: Secondary | ICD-10-CM

## 2019-10-29 LAB — CBC WITH AUTOMATED DIFF
ABS. BASOPHILS: 0.1 10*3/uL (ref 0.0–0.1)
ABS. EOSINOPHILS: 0 10*3/uL (ref 0.0–0.4)
ABS. LYMPHOCYTES: 2.3 10*3/uL (ref 0.9–3.6)
ABS. MONOCYTES: 0.9 10*3/uL (ref 0.05–1.2)
ABS. NEUTROPHILS: 11.2 10*3/uL — ABNORMAL HIGH (ref 1.8–8.0)
BASOPHILS: 0 % (ref 0–2)
EOSINOPHILS: 0 % (ref 0–5)
HCT: 40.4 % (ref 35.0–45.0)
HGB: 13.8 g/dL (ref 12.0–16.0)
LYMPHOCYTES: 16 % — ABNORMAL LOW (ref 21–52)
MCH: 30.4 PG (ref 24.0–34.0)
MCHC: 34.2 g/dL (ref 31.0–37.0)
MCV: 89 FL (ref 74.0–97.0)
MONOCYTES: 6 % (ref 3–10)
MPV: 9.5 FL (ref 9.2–11.8)
NEUTROPHILS: 77 % — ABNORMAL HIGH (ref 40–73)
PLATELET: 301 10*3/uL (ref 135–420)
RBC: 4.54 M/uL (ref 4.20–5.30)
RDW: 13.1 % (ref 11.6–14.5)
WBC: 14.5 10*3/uL — ABNORMAL HIGH (ref 4.6–13.2)

## 2019-10-29 LAB — METABOLIC PANEL, BASIC
Anion gap: 5 mmol/L (ref 3.0–18)
BUN/Creatinine ratio: 8 — ABNORMAL LOW (ref 12–20)
BUN: 7 MG/DL (ref 7.0–18)
CO2: 29 mmol/L (ref 21–32)
Calcium: 9.9 MG/DL (ref 8.5–10.1)
Chloride: 105 mmol/L (ref 100–111)
Creatinine: 0.83 MG/DL (ref 0.6–1.3)
GFR est AA: >60 mL/min/{1.73_m2} (ref >60)
GFR est non-AA: >60 mL/min/{1.73_m2} (ref >60)
Glucose: 98 mg/dL (ref 74–99)
Potassium: 3.4 mmol/L — ABNORMAL LOW (ref 3.5–5.5)
Sodium: 139 mmol/L (ref 136–145)

## 2019-10-29 LAB — CARDIAC PANEL,(CK, CKMB & TROPONIN)
CK - MB: <1 ng/ml (ref <3.6)
CK: 196 U/L — ABNORMAL HIGH (ref 26–192)
Troponin-I, QT: <0.02 NG/ML (ref 0.0–0.045)

## 2019-10-29 LAB — EKG, 12 LEAD, INITIAL
Atrial Rate: 81 {beats}/min
Calculated P Axis: 74 degrees
Calculated R Axis: 36 degrees
Calculated T Axis: 61 degrees
Diagnosis: NORMAL
P-R Interval: 144 ms
Q-T Interval: 388 ms
QRS Duration: 84 ms
QTC Calculation (Bezet): 450 ms
Ventricular Rate: 81 {beats}/min

## 2019-10-29 LAB — MAGNESIUM
Magnesium: 2.2 mg/dL (ref 1.6–2.6)
Magnesium: 2.2 mg/dL (ref 1.6–2.6)

## 2019-10-29 LAB — NT-PRO BNP: NT pro-BNP: 430 PG/ML (ref 0–900)

## 2019-10-29 LAB — BASIC METABOLIC PANEL
Anion Gap: 5 mmol/L (ref 3.0–18)
BUN: 7 MG/DL (ref 7.0–18)
Bun/Cre Ratio: 8 — ABNORMAL LOW (ref 12–20)
CO2: 29 mmol/L (ref 21–32)
Calcium: 9.9 MG/DL (ref 8.5–10.1)
Chloride: 105 mmol/L (ref 100–111)
Creatinine: 0.83 MG/DL (ref 0.6–1.3)
EGFR IF NonAfrican American: >60 mL/min/{1.73_m2} (ref >60)
GFR African American: >60 mL/min/{1.73_m2} (ref >60)
Glucose: 98 mg/dL (ref 74–99)
Potassium: 3.4 mmol/L — ABNORMAL LOW (ref 3.5–5.5)
Sodium: 139 mmol/L (ref 136–145)

## 2019-10-29 LAB — CBC WITH AUTO DIFFERENTIAL
Basophils %: 0 % (ref 0–2)
Basophils Absolute: 0.1 10*3/uL (ref 0.0–0.1)
Eosinophils %: 0 % (ref 0–5)
Eosinophils Absolute: 0 10*3/uL (ref 0.0–0.4)
Hematocrit: 40.4 % (ref 35.0–45.0)
Hemoglobin: 13.8 g/dL (ref 12.0–16.0)
Lymphocytes %: 16 % — ABNORMAL LOW (ref 21–52)
Lymphocytes Absolute: 2.3 10*3/uL (ref 0.9–3.6)
MCH: 30.4 PG (ref 24.0–34.0)
MCHC: 34.2 g/dL (ref 31.0–37.0)
MCV: 89 FL (ref 74.0–97.0)
MPV: 9.5 FL (ref 9.2–11.8)
Monocytes %: 6 % (ref 3–10)
Monocytes Absolute: 0.9 10*3/uL (ref 0.05–1.2)
Neutrophils %: 77 % — ABNORMAL HIGH (ref 40–73)
Neutrophils Absolute: 11.2 10*3/uL — ABNORMAL HIGH (ref 1.8–8.0)
Platelets: 301 10*3/uL (ref 135–420)
RBC: 4.54 M/uL (ref 4.20–5.30)
RDW: 13.1 % (ref 11.6–14.5)
WBC: 14.5 10*3/uL — ABNORMAL HIGH (ref 4.6–13.2)

## 2019-10-29 LAB — CARDIAC PANEL
CK-MB: <1 ng/ml (ref <3.6)
Total CK: 196 U/L — ABNORMAL HIGH (ref 26–192)
Troponin I: <0.02 NG/ML (ref 0.0–0.045)

## 2019-10-29 LAB — EKG 12-LEAD
Atrial Rate: 81 {beats}/min
Diagnosis: NORMAL
P Axis: 74 degrees
P-R Interval: 144 ms
Q-T Interval: 388 ms
QRS Duration: 84 ms
QTc Calculation (Bazett): 450 ms
R Axis: 36 degrees
T Axis: 61 degrees
Ventricular Rate: 81 {beats}/min

## 2019-10-29 LAB — PROBNP, N-TERMINAL: BNP: 430 PG/ML (ref 0–900)

## 2019-10-29 MED ORDER — DOXYCYCLINE HYCLATE 100 MG TAB
100 mg | ORAL_TABLET | Freq: Two times a day (BID) | ORAL | 0 refills | Status: AC
Start: 2019-10-29 — End: 2019-11-05

## 2019-10-29 MED ORDER — PHARMACY INFORMATION NOTE
Freq: Once | Status: DC
Start: 2019-10-29 — End: 2019-10-29

## 2019-10-29 MED ORDER — LORAZEPAM 2 MG/ML IJ SOLN
2 mg/mL | INTRAMUSCULAR | Status: AC
Start: 2019-10-29 — End: 2019-10-29
  Administered 2019-10-29: 16:00:00 via INTRAVENOUS

## 2019-10-29 MED FILL — PHARMACY INFORMATION NOTE: Qty: 1

## 2019-10-29 MED FILL — LORAZEPAM 2 MG/ML IJ SOLN: 2 mg/mL | INTRAMUSCULAR | Qty: 1
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
## 2019-10-29 NOTE — ED Notes (Signed)
Pt presents to ED via EMS w/ c/o chest pain starting at approx 0400. Pt reports recent episode like this that was relieved w/ just aspirin. Pt had left atrial valve replaced 5 years ago. EMS gave aspirin in route.

## 2019-11-19 ENCOUNTER — Telehealth: Payer: Self-pay | Admitting: Family Medicine

## 2019-11-19 MED ORDER — ALBUTEROL SULFATE HFA 108 (90 BASE) MCG/ACT IN AERS: 2.0000 | INHALATION_SPRAY | RESPIRATORY_TRACT | 0 refills | Status: DC | PRN

## 2019-11-19 MED ORDER — ALBUTEROL SULFATE HFA 108 (90 BASE) MCG/ACT IN AERS: 2.0000 | INHALATION_SPRAY | Freq: Four times a day (QID) | RESPIRATORY_TRACT | 5 refills | Status: AC | PRN

## 2019-11-19 MED ORDER — COMBIVENT RESPIMAT 20-100 MCG/ACT IN AERS: 1.0000 | INHALATION_SPRAY | Freq: Four times a day (QID) | RESPIRATORY_TRACT | 0 refills | Status: AC

## 2019-11-19 NOTE — Telephone Encounter (Signed)
[facility] calling about the albuterol (VENTOLIN HFA) 108 (90 Base) MCG/ACT inhaler. State they only carry the 18gram inhaler and wanting to know if the 8g can be changed to 18gram.

## 2019-11-19 NOTE — Telephone Encounter (Signed)
Done

## 2019-11-19 NOTE — Addendum Note (Signed)
Addended by: Loman Brooklyn on: 11/19/2019 03:14 PM   Modules accepted: Orders

## 2019-11-19 NOTE — Telephone Encounter (Signed)
Aware. 

## 2019-11-19 NOTE — Telephone Encounter (Signed)
Left message on VM that requested rx refills were sent to there pharmacy and to keep f/u appt with Colvin Caroli 7/13 for further refills.

## 2019-11-19 NOTE — Telephone Encounter (Signed)
  Prescription Request  11/19/2019  What is the name of the medication or equipment?  Ipratropium-Albuterol (COMBIVENT RESPIMAT) 20-100 MCG/ACT AERS respimat albuterol (VENTOLIN HFA) 108 (90 Base) MCG/ACT inhaler   Have you contacted your pharmacy to request a refill? (if applicable) no  Which pharmacy would you like this sent to? [facility] in [place]    Patient notified that their request is being sent to the clinical staff for review and that they should receive a response within 2 business days.

## 2019-11-26 ENCOUNTER — Ambulatory Visit: Payer: Medicare Other | Admitting: Family Medicine

## 2019-12-03 ENCOUNTER — Encounter: Payer: Self-pay | Admitting: Family Medicine

## 2019-12-03 DIAGNOSIS — I480 Paroxysmal atrial fibrillation: Secondary | ICD-10-CM | POA: Diagnosis not present

## 2019-12-03 DIAGNOSIS — Z7901 Long term (current) use of anticoagulants: Secondary | ICD-10-CM | POA: Diagnosis not present

## 2019-12-21 ENCOUNTER — Other Ambulatory Visit: Payer: Self-pay | Admitting: Family Medicine

## 2019-12-24 DIAGNOSIS — E785 Hyperlipidemia, unspecified: Secondary | ICD-10-CM | POA: Diagnosis not present

## 2019-12-24 DIAGNOSIS — E119 Type 2 diabetes mellitus without complications: Secondary | ICD-10-CM | POA: Diagnosis not present

## 2019-12-24 DIAGNOSIS — Z114 Encounter for screening for human immunodeficiency virus [HIV]: Secondary | ICD-10-CM | POA: Diagnosis not present

## 2019-12-24 DIAGNOSIS — R791 Abnormal coagulation profile: Secondary | ICD-10-CM | POA: Diagnosis not present

## 2019-12-24 DIAGNOSIS — Z0000 Encounter for general adult medical examination without abnormal findings: Secondary | ICD-10-CM | POA: Diagnosis not present

## 2019-12-24 DIAGNOSIS — F99 Mental disorder, not otherwise specified: Secondary | ICD-10-CM | POA: Diagnosis not present

## 2019-12-24 DIAGNOSIS — Z1159 Encounter for screening for other viral diseases: Secondary | ICD-10-CM | POA: Diagnosis not present

## 2019-12-24 NOTE — Telephone Encounter (Signed)
Needs to schedule follow up appointment

## 2019-12-24 NOTE — Telephone Encounter (Signed)
Pt is no longer seeing [place] as she lives in [place].

## 2019-12-26 ENCOUNTER — Emergency Department: Admit: 2019-12-26 | Payer: MEDICARE | Primary: Family Medicine

## 2019-12-26 ENCOUNTER — Inpatient Hospital Stay
Admit: 2019-12-26 | Discharge: 2019-12-27 | Disposition: A | Discharge status: Home / Self Care | Payer: MEDICARE | Attending: Emergency Medicine

## 2019-12-26 DIAGNOSIS — R1084 Generalized abdominal pain: Secondary | ICD-10-CM

## 2019-12-26 DIAGNOSIS — N23 Unspecified renal colic: Secondary | ICD-10-CM | POA: Diagnosis not present

## 2019-12-26 DIAGNOSIS — R109 Unspecified abdominal pain: Secondary | ICD-10-CM | POA: Diagnosis not present

## 2019-12-26 DIAGNOSIS — I4891 Unspecified atrial fibrillation: Secondary | ICD-10-CM | POA: Diagnosis not present

## 2019-12-26 DIAGNOSIS — Z952 Presence of prosthetic heart valve: Secondary | ICD-10-CM | POA: Diagnosis not present

## 2019-12-26 DIAGNOSIS — Z79899 Other long term (current) drug therapy: Secondary | ICD-10-CM | POA: Diagnosis not present

## 2019-12-26 DIAGNOSIS — R52 Pain, unspecified: Secondary | ICD-10-CM | POA: Diagnosis not present

## 2019-12-26 DIAGNOSIS — F419 Anxiety disorder, unspecified: Secondary | ICD-10-CM | POA: Diagnosis not present

## 2019-12-26 DIAGNOSIS — M5489 Other dorsalgia: Secondary | ICD-10-CM | POA: Diagnosis not present

## 2019-12-26 DIAGNOSIS — F17210 Nicotine dependence, cigarettes, uncomplicated: Secondary | ICD-10-CM | POA: Diagnosis not present

## 2019-12-26 DIAGNOSIS — F411 Generalized anxiety disorder: Secondary | ICD-10-CM | POA: Diagnosis not present

## 2019-12-26 DIAGNOSIS — R079 Chest pain, unspecified: Secondary | ICD-10-CM | POA: Diagnosis not present

## 2019-12-26 DIAGNOSIS — I509 Heart failure, unspecified: Secondary | ICD-10-CM | POA: Diagnosis not present

## 2019-12-26 DIAGNOSIS — Z87442 Personal history of urinary calculi: Secondary | ICD-10-CM | POA: Diagnosis not present

## 2019-12-26 DIAGNOSIS — J449 Chronic obstructive pulmonary disease, unspecified: Secondary | ICD-10-CM | POA: Diagnosis not present

## 2019-12-26 DIAGNOSIS — Z7901 Long term (current) use of anticoagulants: Secondary | ICD-10-CM | POA: Diagnosis not present

## 2019-12-26 DIAGNOSIS — K5730 Diverticulosis of large intestine without perforation or abscess without bleeding: Secondary | ICD-10-CM | POA: Diagnosis not present

## 2019-12-26 DIAGNOSIS — M797 Fibromyalgia: Secondary | ICD-10-CM | POA: Diagnosis not present

## 2019-12-26 DIAGNOSIS — R069 Unspecified abnormalities of breathing: Secondary | ICD-10-CM | POA: Diagnosis not present

## 2019-12-26 LAB — CBC WITH AUTOMATED DIFF
ABS. BASOPHILS: 0.1 10*3/uL (ref 0.0–0.1)
ABS. EOSINOPHILS: 0.1 10*3/uL (ref 0.0–0.4)
ABS. LYMPHOCYTES: 3.1 10*3/uL (ref 0.9–3.6)
ABS. MONOCYTES: 0.7 10*3/uL (ref 0.05–1.2)
ABS. NEUTROPHILS: 7.8 10*3/uL (ref 1.8–8.0)
BASOPHILS: 1 % (ref 0–2)
EOSINOPHILS: 1 % (ref 0–5)
HCT: 41.9 % (ref 35.0–45.0)
HGB: 14.2 g/dL (ref 12.0–16.0)
LYMPHOCYTES: 26 % (ref 21–52)
MCH: 30.1 PG (ref 24.0–34.0)
MCHC: 33.9 g/dL (ref 31.0–37.0)
MCV: 89 FL (ref 74.0–97.0)
MONOCYTES: 6 % (ref 3–10)
MPV: 10.4 FL (ref 9.2–11.8)
NEUTROPHILS: 67 % (ref 40–73)
PLATELET: 237 10*3/uL (ref 135–420)
RBC: 4.71 M/uL (ref 4.20–5.30)
RDW: 13 % (ref 11.6–14.5)
WBC: 11.7 10*3/uL (ref 4.6–13.2)

## 2019-12-26 LAB — METABOLIC PANEL, COMPREHENSIVE
A-G Ratio: 1.2 (ref 0.8–1.7)
ALT (SGPT): 23 U/L (ref 13–56)
AST (SGOT): 31 U/L (ref 10–38)
Albumin: 4.3 g/dL (ref 3.4–5.0)
Alk. phosphatase: 70 U/L (ref 45–117)
Anion gap: 4 mmol/L (ref 3.0–18)
BUN/Creatinine ratio: 14 (ref 12–20)
BUN: 16 MG/DL (ref 7.0–18)
Bilirubin, total: 0.6 MG/DL (ref 0.2–1.0)
CO2: 31 mmol/L (ref 21–32)
Calcium: 9.5 MG/DL (ref 8.5–10.1)
Chloride: 104 mmol/L (ref 100–111)
Creatinine: 1.12 MG/DL (ref 0.6–1.3)
GFR est AA: >60 mL/min/{1.73_m2} (ref >60)
GFR est non-AA: 51 mL/min/{1.73_m2} — ABNORMAL LOW (ref >60)
Globulin: 3.6 g/dL (ref 2.0–4.0)
Glucose: 88 mg/dL (ref 74–99)
Potassium: 3.4 mmol/L — ABNORMAL LOW (ref 3.5–5.5)
Protein, total: 7.9 g/dL (ref 6.4–8.2)
Sodium: 139 mmol/L (ref 136–145)

## 2019-12-26 LAB — EKG, 12 LEAD, INITIAL
Atrial Rate: 79 {beats}/min
Calculated P Axis: 80 degrees
Calculated R Axis: 63 degrees
Calculated T Axis: 71 degrees
Diagnosis: NORMAL
P-R Interval: 134 ms
Q-T Interval: 406 ms
QRS Duration: 82 ms
QTC Calculation (Bezet): 465 ms
Ventricular Rate: 79 {beats}/min

## 2019-12-26 LAB — NT-PRO BNP: NT pro-BNP: 441 PG/ML (ref 0–900)

## 2019-12-26 LAB — TROPONIN I: Troponin-I, QT: <0.02 NG/ML (ref 0.0–0.045)

## 2019-12-26 LAB — COMPREHENSIVE METABOLIC PANEL
ALT: 23 U/L (ref 13–56)
AST: 31 U/L (ref 10–38)
Albumin/Globulin Ratio: 1.2 (ref 0.8–1.7)
Albumin: 4.3 g/dL (ref 3.4–5.0)
Alkaline Phosphatase: 70 U/L (ref 45–117)
Anion Gap: 4 mmol/L (ref 3.0–18)
BUN/Creatinine Ratio: 14 (ref 12–20)
BUN: 16 MG/DL (ref 7.0–18)
CO2: 31 mmol/L (ref 21–32)
Calcium: 9.5 MG/DL (ref 8.5–10.1)
Chloride: 104 mmol/L (ref 100–111)
Creatinine: 1.12 MG/DL (ref 0.6–1.3)
GFR African American: >60 mL/min/{1.73_m2} (ref >60)
Globulin: 3.6 g/dL (ref 2.0–4.0)
Glucose: 88 mg/dL (ref 74–99)
Potassium: 3.4 mmol/L — ABNORMAL LOW (ref 3.5–5.5)
Sodium: 139 mmol/L (ref 136–145)
Total Bilirubin: 0.6 MG/DL (ref 0.2–1.0)
Total Protein: 7.9 g/dL (ref 6.4–8.2)
eGFR NON-AA: 51 mL/min/{1.73_m2} — ABNORMAL LOW (ref >60)

## 2019-12-26 LAB — EKG 12-LEAD
Atrial Rate: 79 {beats}/min
Diagnosis: NORMAL
P Axis: 80 degrees
P-R Interval: 134 ms
Q-T Interval: 406 ms
QRS Duration: 82 ms
QTc Calculation (Bazett): 465 ms
R Axis: 63 degrees
T Axis: 71 degrees
Ventricular Rate: 79 {beats}/min

## 2019-12-26 LAB — CBC WITH AUTO DIFFERENTIAL
Basophils %: 1 % (ref 0–2)
Basophils Absolute: 0.1 10*3/uL (ref 0.0–0.1)
Eosinophils %: 1 % (ref 0–5)
Eosinophils Absolute: 0.1 10*3/uL (ref 0.0–0.4)
Hematocrit: 41.9 % (ref 35.0–45.0)
Hemoglobin: 14.2 g/dL (ref 12.0–16.0)
Lymphocytes %: 26 % (ref 21–52)
Lymphocytes Absolute: 3.1 10*3/uL (ref 0.9–3.6)
MCH: 30.1 PG (ref 24.0–34.0)
MCHC: 33.9 g/dL (ref 31.0–37.0)
MCV: 89 FL (ref 74.0–97.0)
MPV: 10.4 FL (ref 9.2–11.8)
Monocytes %: 6 % (ref 3–10)
Monocytes Absolute: 0.7 10*3/uL (ref 0.05–1.2)
Neutrophils %: 67 % (ref 40–73)
Neutrophils Absolute: 7.8 10*3/uL (ref 1.8–8.0)
Platelets: 237 10*3/uL (ref 135–420)
RBC: 4.71 M/uL (ref 4.20–5.30)
RDW: 13 % (ref 11.6–14.5)
WBC: 11.7 10*3/uL (ref 4.6–13.2)

## 2019-12-26 LAB — TROPONIN: Troponin I: <0.02 NG/ML (ref 0.0–0.045)

## 2019-12-26 LAB — PROBNP, N-TERMINAL: BNP: 441 PG/ML (ref 0–900)

## 2019-12-26 MED ORDER — LORAZEPAM 2 MG/ML IJ SOLN
2 mg/mL | INTRAMUSCULAR | Status: DC
Start: 2019-12-26 — End: 2019-12-26

## 2019-12-26 MED ORDER — KETOROLAC TROMETHAMINE 15 MG/ML INJECTION
15 mg/mL | INTRAMUSCULAR | Status: DC
Start: 2019-12-26 — End: 2019-12-26

## 2019-12-26 MED FILL — KETOROLAC TROMETHAMINE 15 MG/ML INJECTION: 15 mg/mL | INTRAMUSCULAR | Qty: 1

## 2019-12-26 NOTE — ED Provider Notes (Signed)
EMERGENCY DEPARTMENT HISTORY AND PHYSICAL EXAM    I have evaluated the patient at 1:53 PM      Date: 12/26/2019  Patient Name: Mary [name]    History of Presenting Illness     Chief Complaint   Patient presents with   ??? Chest Pain         History Provided By: Patient  Location/Duration/Severity/Modifying factors   Patient is a 54 year old female with past medical history of PTSD, anxiety/depression, CHF, atrial fibrillation on warfarin status post atrial clip, valve replacement, COPD, fibromyalgia, chronic back pain presenting to the emergency department for evaluation of chest pain and anxiety.  Patient reports that she feels like her CHF has been "acting up".  She talked to her primary care doctor who told her to double up on her dose of Lasix and that she would see her in a week.  However, she feels like to symptoms are not improving.  She is complaining of chest tightness and extreme anxiety.  Additionally, patient reports passing an approximately 3 mm renal stone this morning and she is having spasms in her left flank.  She denies having fevers or chills, headache, shortness of breath, cough, abdominal pain, nausea, vomiting, diarrhea, numbness or tingling or weakness.                PCP: None    Current Facility-Administered Medications   Medication Dose Route Frequency Provider Last Rate Last Admin   ??? LORazepam (ATIVAN) injection 0.5 mg  0.5 mg IntraVENous See Admin Instructions Retta Diones, DO       ??? ketorolac (TORADOL) injection 15 mg  15 mg IntraVENous NOW Retta Diones, DO           Past History     Past Medical History:  No past medical history on file.    Past Surgical History:  No past surgical history on file.    Family History:  No family history on file.    Social History:  Social History     Tobacco Use   ??? Smoking status: Not on file   Substance Use Topics   ??? Alcohol use: Not on file   ??? Drug use: Not on file       Allergies:  Allergies   Allergen Reactions   ??? Adhesive Rash   ??? Lithium  Nausea and Vomiting         Review of Systems       Review of Systems   Constitutional: Negative for activity change, chills, diaphoresis, fatigue and fever.   HENT: Negative for congestion, ear pain, sinus pain and voice change.    Eyes: Negative for photophobia, pain and visual disturbance.   Respiratory: Negative for cough, chest tightness, shortness of breath, wheezing and stridor.    Cardiovascular: Positive for chest pain. Negative for palpitations.   Gastrointestinal: Negative for abdominal distention, abdominal pain, constipation, diarrhea, nausea and vomiting.   Endocrine: Negative for polydipsia, polyphagia and polyuria.   Genitourinary: Positive for flank pain. Negative for difficulty urinating, dysuria and hematuria.   Musculoskeletal: Negative for back pain, joint swelling and myalgias.   Skin: Negative for rash.   Neurological: Negative for dizziness, weakness and headaches.   Psychiatric/Behavioral: Negative for agitation. The patient is nervous/anxious.          Physical Exam     Visit Vitals  BP 128/66 (BP 1 Location: Left upper arm, BP Patient Position: At rest)   Pulse 96   Temp 99.4 ??F (37.4 ??  C)   Resp 20   Ht 5' 3" (1.6 m)   Wt 82.1 kg (181 lb)   SpO2 95%   BMI 32.06 kg/m??         Physical Exam  Constitutional:       General: She is not in acute distress.     Appearance: She is not toxic-appearing.      Comments: Patient appears very anxious but in no acute distress.   HENT:      Head: Normocephalic and atraumatic.      Mouth/Throat:      Mouth: Mucous membranes are moist.   Eyes:      Extraocular Movements: Extraocular movements intact.      Pupils: Pupils are equal, round, and reactive to light.   Cardiovascular:      Rate and Rhythm: Normal rate and regular rhythm.      Heart sounds: Normal heart sounds. No murmur heard.   No friction rub. No gallop.    Pulmonary:      Effort: Pulmonary effort is normal.      Breath sounds: Normal breath sounds.   Abdominal:      General: There is no  distension.      Palpations: Abdomen is soft. There is no mass.      Tenderness: There is no abdominal tenderness. There is no guarding.      Hernia: No hernia is present.   Musculoskeletal:         General: No swelling, tenderness or deformity.      Cervical back: Normal range of motion and neck supple.   Skin:     General: Skin is warm and dry.      Findings: No rash.   Neurological:      General: No focal deficit present.      Mental Status: She is alert and oriented to person, place, and time.   Psychiatric:         Mood and Affect: Mood is anxious.           Diagnostic Study Results     Labs -  Recent Results (from the past 12 hour(s))   METABOLIC PANEL, COMPREHENSIVE    Collection Time: 12/26/19 10:50 AM   Result Value Ref Range    Sodium 139 136 - 145 mmol/L    Potassium 3.4 (L) 3.5 - 5.5 mmol/L    Chloride 104 100 - 111 mmol/L    CO2 31 21 - 32 mmol/L    Anion gap 4 3.0 - 18 mmol/L    Glucose 88 74 - 99 mg/dL    BUN 16 7.0 - 18 MG/DL    Creatinine 1.12 0.6 - 1.3 MG/DL    BUN/Creatinine ratio 14 12 - 20      GFR est AA >60 >60 ml/min/1.5m    GFR est non-AA 51 (L) >60 ml/min/1.743m   Calcium 9.5 8.5 - 10.1 MG/DL    Bilirubin, total 0.6 0.2 - 1.0 MG/DL    ALT (SGPT) 23 13 - 56 U/L    AST (SGOT) 31 10 - 38 U/L    Alk. phosphatase 70 45 - 117 U/L    Protein, total 7.9 6.4 - 8.2 g/dL    Albumin 4.3 3.4 - 5.0 g/dL    Globulin 3.6 2.0 - 4.0 g/dL    A-G Ratio 1.2 0.8 - 1.7     CBC WITH AUTOMATED DIFF    Collection Time: 12/26/19 10:50 AM   Result Value Ref  Range    WBC 11.7 4.6 - 13.2 K/uL    RBC 4.71 4.20 - 5.30 M/uL    HGB 14.2 12.0 - 16.0 g/dL    HCT 41.9 35.0 - 45.0 %    MCV 89.0 74.0 - 97.0 FL    MCH 30.1 24.0 - 34.0 PG    MCHC 33.9 31.0 - 37.0 g/dL    RDW 13.0 11.6 - 14.5 %    PLATELET 237 135 - 420 K/uL    MPV 10.4 9.2 - 11.8 FL    NEUTROPHILS 67 40 - 73 %    LYMPHOCYTES 26 21 - 52 %    MONOCYTES 6 3 - 10 %    EOSINOPHILS 1 0 - 5 %    BASOPHILS 1 0 - 2 %    ABS. NEUTROPHILS 7.8 1.8 - 8.0 K/UL    ABS.  LYMPHOCYTES 3.1 0.9 - 3.6 K/UL    ABS. MONOCYTES 0.7 0.05 - 1.2 K/UL    ABS. EOSINOPHILS 0.1 0.0 - 0.4 K/UL    ABS. BASOPHILS 0.1 0.0 - 0.1 K/UL    DF AUTOMATED     NT-PRO BNP    Collection Time: 12/26/19 10:50 AM   Result Value Ref Range    NT pro-BNP 441 0 - 900 PG/ML   TROPONIN I    Collection Time: 12/26/19 10:50 AM   Result Value Ref Range    Troponin-I, QT <0.02 0.0 - 0.045 NG/ML   EKG, 12 LEAD, INITIAL    Collection Time: 12/26/19 10:54 AM   Result Value Ref Range    Ventricular Rate 79 BPM    Atrial Rate 79 BPM    P-R Interval 134 ms    QRS Duration 82 ms    Q-T Interval 406 ms    QTC Calculation (Bezet) 465 ms    Calculated P Axis 80 degrees    Calculated R Axis 63 degrees    Calculated T Axis 71 degrees    Diagnosis       Normal sinus rhythm  Minimal voltage criteria for LVH, may be normal variant ( Sokolow-Lyon )  Nonspecific ST and T wave abnormality  Abnormal ECG  When compared with ECG of 29-Oct-2019 11:28,  No significant change was found  Confirmed by Berna Spare MD, --- ([id number]) on 12/26/2019 3:30:46 PM         Radiologic Studies -   XR CHEST PORT   Final Result   :      Minimal bilateral lung base atelectasis or scarring. Otherwise unremarkable   exam.            Medical Decision Making   I am the first provider for this patient.    I reviewed the vital signs, available nursing notes, past medical history, past surgical history, family history and social history.    Vital Signs-Reviewed the patient's vital signs.      EKG: NSR with rate of 79.  No ST elevation or depression.  Normal intervals.      Records Reviewed: Nursing Notes and Old Medical Records (Time of Review: 1:53 PM)    ED Course: Progress Notes, Reevaluation, and Consults:         Provider Notes (Medical Decision Making):   MDM  Number of Diagnoses or Management Options  Anxiety state  Renal colic  Diagnosis management comments: Patient presenting to the emergency department for evaluation of worsening CHF and anxiety, as well as left  flank pain after passing a renal stone  this morning.  She is very anxious on exam but vitals are stable.  She is afebrile.  Saturating well on room air.  Overall physical exam is benign.  Prior to my evaluation, patient had left the emergency department to go to her primary care doctor's office around the corner.  She reportedly sat herself down outside on the sidewalk and had a panic attack.  She was brought back to the emergency department and on my evaluation was more calm although still anxious.  She will be given a dose of Ativan.  She will be given Toradol for her renal colic/bladder spasms.  Blood work that was initiated in triage is negative for acute heart failure.  Initial troponin is negative.  Blood work in general is unremarkable.  Unremarkable chest x-ray.  I will also get in touch with crisis services that they can evaluate her.      1600:  Patient was feeling improved after receiving Ativan and Toradol.  I did discuss case with crisis services who came to provide her with resources, however patient had left the emergency department prior to them seeing her.  She did not divulge any active suicidal or homicidal ideations.  Patient will be discharged to follow-up with her primary care doctor.  ED return precautions have been discussed.             Diagnosis     Clinical Impression:   1. Renal colic    2. Anxiety state        Disposition: home    Follow-up Information     Follow up With Specialties Details Why Jeff    [facility] EMERGENCY DEPT Emergency Medicine  As needed, If symptoms worsen [place]  [phone]    your primary care provider               Patient's Medications    No medications on file     Disclaimer: Sections of this note are dictated using utilizing voice recognition software.  Minor typographical errors may be present. If questions arise, please do not hesitate to contact me or call our department.

## 2019-12-26 NOTE — ED Notes (Signed)
Pt arrived EMS for chest pain and bilateral lower extremity edema starting yesterday  Hx of HTN, CHF, COPD, BiPolar, AFIB, PTSD.    Pt reports Lasix prescription adjusted recently and took three pills yesterday with some relief and chest pain worsening today.  Chest pain is non radiating burning pain.

## 2019-12-26 NOTE — ED Notes (Signed)
Pt presenting w/CP/L-sided pain since 1800 day prior.  Pt states she passed a kidney stone prior to calling 911.    Hx: CHF, A-fib, Hypertension, Aortic valve replacement, COPD, Asthma  Hx: Bipolar, PTSD, Personality disorder, OCD, depression; PT DENIES HI/SI   Pt is a smoker

## 2019-12-26 NOTE — ED Notes (Signed)
 Presents via [employer] with c/o passing a kidney stone  Pt has been seen 2 times in this ed today for the same.  Pt denies any specific complaint

## 2019-12-26 NOTE — ED Notes (Deleted)
Left sided CP; may also have kidney stone.  H/o CHF, afib, HTN, AVR, COPD, asthma.  Will need help with transportation home.    I performed a brief evaluation, including history and physical, of the patient here in triage and I have determined that pt will need further treatment and evaluation from the main side ER physician.  I have placed initial orders to help in expediting patients care.     December 26, 2019 at 11:11 AM - Loral Kitchens, PA        Visit Vitals  BP (!) 134/113 (BP 1 Location: Left upper arm, BP Patient Position: At rest;Sitting)   Pulse 73   Temp 99.4 F (37.4 C)   Resp 16   Ht 5\' 3"  (1.6 m)   Wt 82.1 kg (181 lb)   SpO2 100%   BMI 32.06 kg/m

## 2019-12-26 NOTE — ED Notes (Signed)
Pt screaming she was walking out and leaving, demanding IV be pulled.  IV DC'd at this time.   Pt THEN asking for crackers, anxiety meds and kleenex.

## 2019-12-27 ENCOUNTER — Inpatient Hospital Stay
Admit: 2019-12-27 | Discharge: 2019-12-27 | Disposition: A | Discharge status: Home / Self Care | Payer: MEDICARE | Attending: Emergency Medicine

## 2019-12-27 ENCOUNTER — Emergency Department: Admit: 2019-12-27 | Payer: MEDICARE | Primary: Family Medicine

## 2019-12-27 DIAGNOSIS — R1084 Generalized abdominal pain: Secondary | ICD-10-CM | POA: Diagnosis not present

## 2019-12-27 DIAGNOSIS — R109 Unspecified abdominal pain: Secondary | ICD-10-CM | POA: Diagnosis not present

## 2019-12-27 DIAGNOSIS — R2243 Localized swelling, mass and lump, lower limb, bilateral: Secondary | ICD-10-CM | POA: Diagnosis not present

## 2019-12-27 DIAGNOSIS — J449 Chronic obstructive pulmonary disease, unspecified: Secondary | ICD-10-CM | POA: Diagnosis not present

## 2019-12-27 DIAGNOSIS — K5730 Diverticulosis of large intestine without perforation or abscess without bleeding: Secondary | ICD-10-CM | POA: Diagnosis not present

## 2019-12-27 LAB — URINALYSIS W/ RFLX MICROSCOPIC
Bilirubin, Urine: NEGATIVE
Bilirubin: NEGATIVE
Blood, Urine: NEGATIVE
Blood: NEGATIVE
Glucose, Ur: NEGATIVE mg/dL
Glucose: NEGATIVE mg/dL
Ketone: NEGATIVE mg/dL
Ketones, Urine: NEGATIVE mg/dL
Leukocyte Esterase, Urine: NEGATIVE
Leukocyte Esterase: NEGATIVE
Nitrite, Urine: NEGATIVE
Nitrites: NEGATIVE
Protein, UA: NEGATIVE mg/dL
Protein: NEGATIVE mg/dL
Specific Gravity, UA: 1.029 (ref 1.005–1.030)
Specific gravity: 1.029 (ref 1.005–1.030)
Urobilinogen, UA, POCT: 1 EU/dL (ref 0.2–1.0)
Urobilinogen: 1 EU/dL (ref 0.2–1.0)
pH (UA): 5.5 (ref 5.0–8.0)
pH, UA: 5.5 (ref 5.0–8.0)

## 2019-12-27 MED ORDER — ACETAMINOPHEN 500 MG TAB
500 mg | Freq: Once | ORAL | Status: AC
Start: 2019-12-27 — End: 2019-12-27
  Administered 2019-12-27: 06:00:00 via ORAL

## 2019-12-27 MED ORDER — IBUPROFEN 600 MG TAB
600 mg | Freq: Once | ORAL | Status: AC
Start: 2019-12-27 — End: 2019-12-27
  Administered 2019-12-27: 06:00:00 via ORAL

## 2019-12-27 MED FILL — ACETAMINOPHEN 500 MG TAB: 500 mg | ORAL | Qty: 2

## 2019-12-27 MED FILL — IBUPROFEN 600 MG TAB: 600 mg | ORAL | Qty: 1

## 2019-12-27 NOTE — ED Notes (Signed)
Pt d/cd by provider

## 2019-12-27 NOTE — ED Provider Notes (Signed)
Patient is a 54 year old female who presents to the emergency department with a left-sided flank pain radiating around into her groin and.  Of note patient states that she was seen here earlier today for what she describes as a CHF exacerbation, however she states that she left because she was tired of sitting and waiting.  Currently she denies any chest pain or difficulty breathing.  States the swelling in her lower extremities has improved.  She denies any worsening cough.  She states that for which she was seen here this morning she feels better however she does have this additional pain in her left flank, rating down to her left groin that she is concerned about.  She states that she recently passed a very large kidney stone.  She is states those she has had kidney stones in the past.  She denies any dysuria but states she has had significant increased urinary frequency.  Denies any hematuria.  She has had some subjective sweats but denies any overt fevers.  She denies any difficulty urinating.  She denies any urgency.  She denies any trauma or injury.  States that the pain is worse if she tries to take a deep breath or touches the area.  She denies any rashes or skin changes.  The symptoms of been going on for several days.  She passed the stone only a couple days ago.           Past Medical History:   Diagnosis Date   ??? Anxiety    ??? Asthma    ??? Bipolar 1 disorder (HCC)    ??? CHF (congestive heart failure) (HCC)    ??? COPD (chronic obstructive pulmonary disease) (HCC)    ??? H/O aortic valve replacement    ??? PTSD (post-traumatic stress disorder)        History reviewed. No pertinent surgical history.      History reviewed. No pertinent family history.    Social History     Socioeconomic History   ??? Marital status: SINGLE     Spouse name: Not on file   ??? Number of children: Not on file   ??? Years of education: Not on file   ??? Highest education level: Not on file   Occupational History   ??? Not on file   Tobacco Use    ??? Smoking status: Current Every Day Smoker   Substance and Sexual Activity   ??? Alcohol use: Not Currently   ??? Drug use: Yes     Types: Marijuana   ??? Sexual activity: Not on file   Other Topics Concern   ??? Not on file   Social History Narrative   ??? Not on file     Social Determinants of Health     Financial Resource Strain:    ??? Difficulty of Paying Living Expenses:    Food Insecurity:    ??? Worried About Programme researcher, broadcasting/film/video in the Last Year:    ??? Barista in the Last Year:    Transportation Needs:    ??? Freight forwarder (Medical):    ??? Lack of Transportation (Non-Medical):    Physical Activity:    ??? Days of Exercise per Week:    ??? Minutes of Exercise per Session:    Stress:    ??? Feeling of Stress :    Social Connections:    ??? Frequency of Communication with Friends and Family:    ??? Frequency of Social Gatherings with  Friends and Family:    ??? Attends Religious Services:    ??? Database administrator or Organizations:    ??? Attends Engineer, structural:    ??? Marital Status:    Intimate Programme researcher, broadcasting/film/video Violence:    ??? Fear of Current or Ex-Partner:    ??? Emotionally Abused:    ??? Physically Abused:    ??? Sexually Abused:          ALLERGIES: Adhesive and Lithium    Review of Systems  Constitutional: [no fever]  HENT: [no ear pain]  Eyes: [no change in vision]  Respiratory: [no SOB]  Cardio: [no chest pain]  GI: [no blood in stool]  GU: [no hematuria]  MSK: Positive for back pain  Skin: [no rashes]  Neuro: [no headache]    Vitals:    12/26/19 2150   BP: (!) 110/90   Pulse: 73   Resp: 16   Temp: 99.4 ??F (37.4 ??C)   SpO2: 93%   Weight: 82.1 kg (181 lb)            Physical Exam   General: [No acute distress]  Head: [Normocephalic, atraumatic]  Psych: [cooperative and alert]  Eyes: [No scleral icterus, normal conjunctiva]  ENT: [moist oral mucosa]  Neck: [supple]  CV: [regular rate and rhythm, 1+ pitting edema, palpable radial pulses]  Pulm: [clear breath sounds bilaterally without any wheezing or rhonchi, normal  respiratory rate]  GI: [normal bowel sounds, soft, non-tender], mild left-sided CVA tenderness  MSK: [moves all four extremities]  Skin: [no rashes]  Neuro: [alert and conversive]     MDM   Patient is a 54 year old female who presents withLeft-sided flank pain and increased urinary frequency.  Patient does state that she recently passed a kidney stone and does have a history of kidney stones.  Unclear if she still has a retained stone causing her discomfort or if this is just pain from previously passing 1.  Would also like to rule out infection in this patient.  Review of the EMR shows that she had blood work done less than 24 hours ago.  This is reviewed.  This shows a CBC, CMP, troponin and BNP all within normal limits.  Since her symptoms have improved I do not think that she requires repeat imaging at this time.  She is still making urine and at that time had minimal increase in her baseline creatinine and therefore do not feel this needs to be repeated at this time.  We will however check a urine and obtain a CT Noncon to evaluate for any retained kidney stones.  UA is unremarkable for any signs of infection or blood.    CT scan of the abdomen pelvis shows no acute abnormalities.  No renal stones are visualized.  Patient has diverticulosis without diverticulitis.  They also note a chronic appearing compression fracture of T11 and T12.  Upon reexamination patient does not have any point tenderness here and states that she has had chronic back pain in this area for some time.  She denies any difficulty walking, traveling into her urine or stool or changes in her symptoms over this time.      Patient stable for discharge at this time.  Patient is in agreement with the plan to be discharged at this time.  All the patient's questions were answered.  Patient was given written instructions on the diagnosis, and states understanding of the plan moving forward.  We did discuss important signs and symptoms that should  prompt quick return to the emergency department.    Disposition: Patient was discharged home in stable condition.  They will follow up with her PCP and urology    Prescriptions: None    Diagnosis: Acute flank pain  History of CHF    Procedures

## 2019-12-27 NOTE — ED Notes (Signed)
Pt walking in and out of ed lobby to smoke cigarettes

## 2019-12-31 DIAGNOSIS — M549 Dorsalgia, unspecified: Secondary | ICD-10-CM | POA: Diagnosis not present

## 2019-12-31 DIAGNOSIS — Z8739 Personal history of other diseases of the musculoskeletal system and connective tissue: Secondary | ICD-10-CM | POA: Diagnosis not present

## 2019-12-31 DIAGNOSIS — R0781 Pleurodynia: Secondary | ICD-10-CM | POA: Diagnosis not present

## 2019-12-31 DIAGNOSIS — Z8679 Personal history of other diseases of the circulatory system: Secondary | ICD-10-CM | POA: Diagnosis not present

## 2019-12-31 DIAGNOSIS — Z7901 Long term (current) use of anticoagulants: Secondary | ICD-10-CM | POA: Diagnosis not present

## 2019-12-31 DIAGNOSIS — F3175 Bipolar disorder, in partial remission, most recent episode depressed: Secondary | ICD-10-CM | POA: Diagnosis not present

## 2019-12-31 DIAGNOSIS — I480 Paroxysmal atrial fibrillation: Secondary | ICD-10-CM | POA: Diagnosis not present

## 2020-01-21 ENCOUNTER — Other Ambulatory Visit: Payer: Self-pay | Admitting: Family Medicine

## 2020-01-24 ENCOUNTER — Inpatient Hospital Stay: Payer: MEDICARE | Primary: Family Medicine

## 2020-01-24 ENCOUNTER — Encounter

## 2020-01-24 ENCOUNTER — Inpatient Hospital Stay: Admit: 2020-01-24 | Payer: MEDICARE | Attending: Family Medicine | Primary: Family Medicine

## 2020-01-24 DIAGNOSIS — M545 Low back pain, unspecified: Secondary | ICD-10-CM

## 2020-01-24 DIAGNOSIS — M8580 Other specified disorders of bone density and structure, unspecified site: Secondary | ICD-10-CM

## 2020-01-24 DIAGNOSIS — M47816 Spondylosis without myelopathy or radiculopathy, lumbar region: Secondary | ICD-10-CM | POA: Diagnosis not present

## 2020-01-25 ENCOUNTER — Inpatient Hospital Stay
Admit: 2020-01-25 | Discharge: 2020-01-25 | Disposition: A | Discharge status: Home / Self Care | Payer: MEDICARE | Attending: Emergency Medicine

## 2020-01-25 DIAGNOSIS — M5442 Lumbago with sciatica, left side: Secondary | ICD-10-CM

## 2020-01-25 DIAGNOSIS — I509 Heart failure, unspecified: Secondary | ICD-10-CM | POA: Diagnosis not present

## 2020-01-25 DIAGNOSIS — T148XXA Other injury of unspecified body region, initial encounter: Secondary | ICD-10-CM | POA: Diagnosis not present

## 2020-01-25 DIAGNOSIS — F17200 Nicotine dependence, unspecified, uncomplicated: Secondary | ICD-10-CM | POA: Diagnosis not present

## 2020-01-25 DIAGNOSIS — Z952 Presence of prosthetic heart valve: Secondary | ICD-10-CM | POA: Diagnosis not present

## 2020-01-25 DIAGNOSIS — J449 Chronic obstructive pulmonary disease, unspecified: Secondary | ICD-10-CM | POA: Diagnosis not present

## 2020-01-25 MED ORDER — PREDNISONE 50 MG TAB
50 mg | ORAL_TABLET | Freq: Every day | ORAL | 0 refills | Status: AC
Start: 2020-01-25 — End: 2020-01-28

## 2020-01-25 MED ORDER — OXYCODONE-ACETAMINOPHEN 5 MG-325 MG TAB
5-325 mg | ORAL_TABLET | ORAL | 0 refills | Status: AC | PRN
Start: 2020-01-25 — End: 2020-02-01

## 2020-01-25 NOTE — ED Provider Notes (Signed)
EMERGENCY DEPARTMENT HISTORY AND PHYSICAL EXAM  This was created with voice recognition software and transcription errors may be present.     2:44 PM  Date: 01/25/2020  Patient Name: Mary [name]    History of Presenting Illness     Chief Complaint:    History Provided By:     HPI: Mary [name] is a 54 y.o. female past medical history of anxiety asthma bipolar disease CHF COPD aortic valve replacement PTSD multiple back injuries who presents with low back pain.  Patient [place] that [place]  She lives with her daughter and notes that she is having worse pain.  Patient states she walked to the store and back yesterday but states she thinks she aggravated it.  She has lost some left leg shooting pains no saddle paresthesias no bowel or bladder incontinence.  No difficulty with bowel or bladder problems either.  Also notes some intermittent left leg numbness that is unchanged from prior    PCP: None      Past History     Past Medical History:  Past Medical History:   Diagnosis Date   ??? Anxiety    ??? Asthma    ??? Bipolar 1 disorder (HCC)    ??? CHF (congestive heart failure) (HCC)    ??? COPD (chronic obstructive pulmonary disease) (HCC)    ??? H/O aortic valve replacement    ??? PTSD (post-traumatic stress disorder)        Past Surgical History:  No past surgical history on file.    Family History:  No family history on file.    Social History:  Social History     Tobacco Use   ??? Smoking status: Current Every Day Smoker   Substance Use Topics   ??? Alcohol use: Not Currently   ??? Drug use: Yes     Types: Marijuana       Allergies:  Allergies   Allergen Reactions   ??? Adhesive Rash   ??? Lithium Nausea and Vomiting       Review of Systems     Review of Systems   All other systems reviewed and are negative.    10 point review of systems otherwise negative unless noted in HPI.    Physical Exam       Physical Exam  Constitutional:       Appearance: She is well-developed.   HENT:      Head: Normocephalic and  atraumatic.   Eyes:      Pupils: Pupils are equal, round, and reactive to light.   Cardiovascular:      Rate and Rhythm: Normal rate and regular rhythm.      Heart sounds: Normal heart sounds. No murmur heard.   No friction rub.   Pulmonary:      Effort: Pulmonary effort is normal. No respiratory distress.      Breath sounds: Normal breath sounds. No wheezing.   Abdominal:      General: There is no distension.      Palpations: Abdomen is soft.      Tenderness: There is no abdominal tenderness. There is no guarding or rebound.   Musculoskeletal:         General: Normal range of motion.      Cervical back: Normal range of motion and neck supple.      Comments: Left paraspinous lumbar back pain   Skin:     General: Skin is warm and dry.   Neurological:  Mental Status: She is alert and oriented to person, place, and time.   Psychiatric:         Behavior: Behavior normal.         Thought Content: Thought content normal.         Diagnostic Study Results     Vital Signs  EKG:  Labs:   Imaging:     Medical Decision Making     ED Course: Progress Notes, Reevaluation, and Consults:    I will be the provider of record for this patient.     Provider Notes (Medical Decision Making): With extensive history of injuries to her back with rods and fractures had x-rays done yesterday notes persistent pain    X-ray reviewed from yesterday unremarkable will discuss with family medicine team to determine course of action    Family medicine patient was not given any prescription for pain medication on her last visit.  We will give her prescription here and then have her follow-up Monday with the family medicine group       Diagnosis     Clinical Impression: No diagnosis found.    Disposition:        Patient's Medications    No medications on file

## 2020-01-25 NOTE — ED Notes (Signed)
Patient arrived via ems for mid back pain. States she walked to [employer] on Thursday and thinks she hurt her back carrying too much home.

## 2020-01-28 DIAGNOSIS — Z7901 Long term (current) use of anticoagulants: Secondary | ICD-10-CM | POA: Diagnosis not present

## 2020-01-28 DIAGNOSIS — I480 Paroxysmal atrial fibrillation: Secondary | ICD-10-CM | POA: Diagnosis not present

## 2020-02-06 ENCOUNTER — Ambulatory Visit
Admit: 2020-02-06 | Discharge: 2020-02-06 | Payer: MEDICARE | Attending: Physical Medicine & Rehabilitation | Primary: Family Medicine

## 2020-02-06 ENCOUNTER — Ambulatory Visit: Attending: Physical Medicine & Rehabilitation | Primary: Family Medicine

## 2020-02-06 DIAGNOSIS — M542 Cervicalgia: Secondary | ICD-10-CM

## 2020-02-06 DIAGNOSIS — M818 Other osteoporosis without current pathological fracture: Secondary | ICD-10-CM | POA: Diagnosis not present

## 2020-02-06 DIAGNOSIS — M4854XD Collapsed vertebra, not elsewhere classified, thoracic region, subsequent encounter for fracture with routine healing: Secondary | ICD-10-CM | POA: Diagnosis not present

## 2020-02-06 DIAGNOSIS — G894 Chronic pain syndrome: Secondary | ICD-10-CM | POA: Diagnosis not present

## 2020-02-06 DIAGNOSIS — M546 Pain in thoracic spine: Secondary | ICD-10-CM | POA: Diagnosis not present

## 2020-02-06 DIAGNOSIS — G8929 Other chronic pain: Secondary | ICD-10-CM | POA: Diagnosis not present

## 2020-02-06 MED ORDER — CYCLOBENZAPRINE 5 MG TAB
5 mg | ORAL_TABLET | ORAL | 0 refills | Status: DC
Start: 2020-02-06 — End: 2020-03-06

## 2020-02-06 MED ORDER — PREGABALIN 200 MG CAP
200 mg | ORAL_CAPSULE | Freq: Three times a day (TID) | ORAL | 2 refills | Status: DC
Start: 2020-02-06 — End: 2020-04-29

## 2020-02-06 NOTE — Progress Notes (Signed)
 Mary [name] presents today for   Chief Complaint   Patient presents with   . Neck Pain   . Back Pain       Is someone accompanying this pt?no    Is the patient using any DME equipment during OV? no      Coordination of Care:  1. Have you been to the ER, urgent care clinic since your last visit? no  Hospitalized since your last visit? no    2. Have you seen or consulted any other health care providers outside of the [facility] since your last visit? no Include any pap smears or colon screening. no
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
## 2020-02-07 NOTE — Telephone Encounter (Signed)
Patient called again crying requesting guidance for what she should do regarding her pain. Please advise.

## 2020-02-07 NOTE — Telephone Encounter (Signed)
Patient called again crying. Please advise patient. Patient's contact is [phone]

## 2020-02-07 NOTE — Telephone Encounter (Signed)
Patient called crying because she has been up all night in pain.  She said she called the ED and they told her they would just be having her sit in a wheelchair because they had no beds. She is asking to be seen today.  Please advise.    Patient [phone]

## 2020-02-07 NOTE — Telephone Encounter (Signed)
I called and spoke to the pt. The pt was identified using 2 pt identifiers. She states that something happened yesterday to her back. She felt a pop and she has not been comfortable since then. Her pain is in the mid back area and very sharp. Pt reports that she has had a  compression fracture in the back at T8 in December 2020. Pt is not able to go to urgent care because off transportation issues.

## 2020-02-08 NOTE — Telephone Encounter (Signed)
Late entry from yesterday. Spoke to patient. Acute back pain while standing having coffee and cigarette. Denies trauma, neuro sx, fever, CP. Sounded sedated.    Use ice/heat/stretches. If severe, needs go to urgent care.

## 2020-02-11 NOTE — Telephone Encounter (Signed)
 Patient states she cannot take the muscle relaxer. She says taking it along with her other medications makes her walk around like a drunk.  She did not take it last night or today.     She is asking if we can just order an xray of her back.  She is still in pain and tearful.  She does not want to just go to the ED for an xray.  She says she has a history of compression fractures.      Patient [phone]

## 2020-02-12 NOTE — Telephone Encounter (Signed)
She needs to be evaluated.  I dont know what part of her back is hurting  cervical, thoracic, lumbar?

## 2020-02-12 NOTE — Telephone Encounter (Signed)
It is the thoracic area. Pt reported this Friday when I spoke to her. She spoke to Dr. Wilford Corner on Friday evening. Pt is aware that we are not an urgent care clinic.

## 2020-02-12 NOTE — Telephone Encounter (Signed)
She will need to be evaluated.  She can go to urgent care that has x-ray if needed.

## 2020-02-12 NOTE — Telephone Encounter (Signed)
Sent back to you

## 2020-02-12 NOTE — Telephone Encounter (Signed)
Patient called again for status of this request.  Please return call at [phone]

## 2020-02-13 ENCOUNTER — Encounter

## 2020-02-13 NOTE — Telephone Encounter (Signed)
I called the pt about her message. She was not able to be reached. A message was left for the pt letting her know that the reviewing provider recommends that she go to an urgent care for the requested x-ray and evaluation. The number to the office was provided in case she has any questions or concerns. No other numbers listed for pt contact.

## 2020-02-18 ENCOUNTER — Ambulatory Visit: Attending: Cardiovascular Disease | Primary: Family Medicine

## 2020-02-21 ENCOUNTER — Ambulatory Visit: Payer: MEDICARE | Primary: Family Medicine

## 2020-02-25 DIAGNOSIS — Z7901 Long term (current) use of anticoagulants: Secondary | ICD-10-CM | POA: Diagnosis not present

## 2020-02-25 DIAGNOSIS — I480 Paroxysmal atrial fibrillation: Secondary | ICD-10-CM | POA: Diagnosis not present

## 2020-03-02 ENCOUNTER — Emergency Department: Admit: 2020-03-02 | Payer: MEDICARE | Primary: Family Medicine

## 2020-03-02 ENCOUNTER — Inpatient Hospital Stay
Admit: 2020-03-02 | Discharge: 2020-03-02 | Disposition: A | Discharge status: Home / Self Care | Payer: MEDICARE | Attending: Emergency Medicine

## 2020-03-02 DIAGNOSIS — M546 Pain in thoracic spine: Secondary | ICD-10-CM

## 2020-03-02 DIAGNOSIS — Y93K1 Activity, walking an animal: Secondary | ICD-10-CM | POA: Diagnosis not present

## 2020-03-02 DIAGNOSIS — J449 Chronic obstructive pulmonary disease, unspecified: Secondary | ICD-10-CM | POA: Diagnosis not present

## 2020-03-02 DIAGNOSIS — Z79899 Other long term (current) drug therapy: Secondary | ICD-10-CM | POA: Diagnosis not present

## 2020-03-02 DIAGNOSIS — I509 Heart failure, unspecified: Secondary | ICD-10-CM | POA: Diagnosis not present

## 2020-03-02 DIAGNOSIS — X500XXA Overexertion from strenuous movement or load, initial encounter: Secondary | ICD-10-CM | POA: Diagnosis not present

## 2020-03-02 DIAGNOSIS — M810 Age-related osteoporosis without current pathological fracture: Secondary | ICD-10-CM | POA: Diagnosis not present

## 2020-03-02 DIAGNOSIS — Z791 Long term (current) use of non-steroidal anti-inflammatories (NSAID): Secondary | ICD-10-CM | POA: Diagnosis not present

## 2020-03-02 DIAGNOSIS — F17200 Nicotine dependence, unspecified, uncomplicated: Secondary | ICD-10-CM | POA: Diagnosis not present

## 2020-03-02 DIAGNOSIS — Z7901 Long term (current) use of anticoagulants: Secondary | ICD-10-CM | POA: Diagnosis not present

## 2020-03-02 MED ORDER — HYDROCODONE-ACETAMINOPHEN 5 MG-325 MG TAB
5-325 mg | ORAL | Status: AC
Start: 2020-03-02 — End: 2020-03-02
  Administered 2020-03-02: 16:00:00 via ORAL

## 2020-03-02 MED ORDER — LIDOCAINE 5 % (700 MG/PATCH) ADHESIVE PATCH
5 % | CUTANEOUS | 0 refills | Status: DC
Start: 2020-03-02 — End: 2020-04-27

## 2020-03-02 MED ORDER — HYDROCODONE-ACETAMINOPHEN 5 MG-325 MG TAB
5-325 mg | ORAL_TABLET | Freq: Four times a day (QID) | ORAL | 0 refills | Status: AC | PRN
Start: 2020-03-02 — End: 2020-03-05

## 2020-03-02 MED FILL — HYDROCODONE-ACETAMINOPHEN 5 MG-325 MG TAB: 5-325 mg | ORAL | Qty: 1

## 2020-03-02 NOTE — ED Provider Notes (Signed)
ED Provider Notes by Silvio Pate, PA at 03/02/20 0845                Author: Silvio Pate, PA  Service: EMERGENCY  Author Type: Physician Assistant       Filed: 03/02/20 1846  Date of Service: 03/02/20 0845  Status: Attested           Editor: Silvio Pate, PA (Physician Assistant)  Cosigner: Gerrit Halls, MD at 03/03/20 0732          Attestation signed by Gerrit Halls, MD at 03/03/20 0732          I was personally available for consultation in the emergency department.  I have reviewed the chart and agree with the documented record by the APP, including  the assessment, treatment plan, and disposition.  Barbaraann Share, MD.                                 EMERGENCY DEPARTMENT HISTORY AND PHYSICAL EXAM      8:45 AM         Date: 03/02/2020   Patient Name: Mary [name]        History of Presenting Illness          Chief Complaint       Patient presents with        ?  Back Pain        History Provided By: Patient      Additional History (Context): Mary [name]  is a 54 y.o. female with hx of  PTSD, COPD, asthma, chronic thoracic back pain, and other noted PMH who presents with complaint of acute on chronic mid back pain x2 days.  Patient notes symptoms started after "cleaning a lot and walking my daughter's dog ".  Patient notes symptoms are  worse with movement, associated with muscle spasms.  Patient denies fever or chills, diaphoresis, chest pain, shortness of breath, abdominal pain, nausea or vomiting, urinary or bowel incontinence or retention, numbness or tingling, history IVDA, history  of cancer.  Patient notes she has taken her Lyrica and a "leftover baclofen" without relief of symptoms.      PCP: None        Current Facility-Administered Medications             Medication  Dose  Route  Frequency  Provider  Last Rate  Last Admin              ?  HYDROcodone-acetaminophen (NORCO) 5-325 mg per tablet 1 Tablet   1 Tablet  Oral  NOW  Silvio Pate, PA                Current Outpatient  Medications          Medication  Sig  Dispense  Refill           ?  HYDROcodone-acetaminophen (NORCO) 5-325 mg per tablet  Take 1 Tablet by mouth every six (6) hours as needed for Pain for up to 3 days. Max Daily Amount: 4 Tablets.  10 Tablet  0           ?  lidocaine (Lidoderm) 5 %  Apply patch to the affected area for 12 hours a day and remove for 12 hours a day.  30 Each  0           ?  albuterol (PROVENTIL HFA, VENTOLIN HFA, PROAIR HFA) 90  mcg/actuation inhaler  Take 2 Puffs by inhalation every six (6) hours as needed.         ?  aspirin delayed-release 81 mg tablet  Take 81 mg by mouth daily.         ?  cetirizine (ZYRTEC) 10 mg tablet  Take 10 mg by mouth daily.         ?  fluticasone propionate (FLONASE) 50 mcg/actuation nasal spray  1 Spray by Nasal route daily.         ?  furosemide (LASIX) 40 mg tablet  TAKE ONE TABLET BY MOUTH EVERY DAY         ?  ipratropium-albuteroL (Combivent Respimat) 20-100 mcg/actuation inhaler  Take 1 Puff by inhalation.         ?  Combivent Respimat 20-100 mcg/actuation inhaler  INHALE 1 PUFF BY MOUTH INTO LUNGS EVERY 6 HOURS         ?  lamoTRIgine (LaMICtal) 25 mg tablet  TAKE 3 TABLETS BY MOUTH ONCE DAILY         ?  metoprolol succinate (TOPROL-XL) 25 mg XL tablet           ?  omeprazole (PRILOSEC) 40 mg capsule  Take 40 mg by mouth daily.         ?  pravastatin (PRAVACHOL) 40 mg tablet  Take 40 mg by mouth nightly.           ?  predniSONE (STERAPRED DS) 10 mg dose pack  As directed x 6 days         ?  Psyllium Husk-Sucrose 3.4 gram/7 gram powd  Take 1 Packet by mouth daily.         ?  warfarin (COUMADIN) 2.5 mg tablet  TAKE 2 TABLETS BY MOUTH ONCE DAILY ON MONDAY WEDNESDAY FRIDAY AND TAKE 1 & 1 2 (ONE AND ONE HALF) ALL OTHER DAYS         ?  pregabalin (LYRICA) 200 mg capsule  Take 1 Capsule by mouth three (3) times daily. Max Daily Amount: 600 mg.  90 Capsule  2           ?  cyclobenzaprine (FLEXERIL) 5 mg tablet  TAKE 1 TABLET BY MOUTH EVERY DAY AT BEDTIME AS NEEDED  30  Tablet  0             Past History        Past Medical History:     Past Medical History:        Diagnosis  Date         ?  Anxiety       ?  Asthma       ?  Bipolar 1 disorder (HCC)       ?  CHF (congestive heart failure) (HCC)       ?  COPD (chronic obstructive pulmonary disease) (HCC)       ?  H/O aortic valve replacement           ?  PTSD (post-traumatic stress disorder)             Past Surgical History:   No past surgical history on file.      Family History:   No family history on file.      Social History:     Social History          Tobacco Use         ?  Smoking status:  Current  Every Day Smoker              Packs/day:  1.00         ?  Smokeless tobacco:  Never Used       Substance Use Topics         ?  Alcohol use:  Not Currently     ?  Drug use:  Yes              Types:  Marijuana           Allergies:     Allergies        Allergen  Reactions         ?  Adhesive  Rash         ?  Lithium  Nausea and Vomiting                Review of Systems           Review of Systems    Constitutional: Negative for chills and fever.    Respiratory: Negative for shortness of breath.     Cardiovascular: Negative for chest pain.    Gastrointestinal: Negative for abdominal pain, nausea and vomiting.    Musculoskeletal: Positive for back pain.    Skin: Negative for rash.    Neurological: Negative for weakness.    All other systems reviewed and are negative.              Physical Exam        Visit Vitals      BP  (!) 148/84     Pulse  68     Temp  98.8 ??F (37.1 ??C)     Resp  16        SpO2  97%              Physical Exam   Vitals and nursing note reviewed.   Constitutional:        General: She is not in acute distress.     Appearance: Normal appearance. She is well-developed. She is not ill-appearing, toxic-appearing or diaphoretic.   HENT:       Head: Normocephalic and atraumatic.   Cardiovascular :       Rate and Rhythm: Normal rate and regular rhythm.      Heart sounds: Normal heart sounds. No murmur heard.   No friction rub.  No gallop.    Pulmonary:       Effort: Pulmonary effort is normal. No respiratory distress.      Breath sounds: Normal breath sounds. No wheezing or rales.    Abdominal:      General: Bowel sounds are normal. There is no distension.      Palpations: Abdomen is soft.      Tenderness: There is no abdominal tenderness. There is no right CVA tenderness,  left CVA tenderness, guarding or rebound.     Musculoskeletal:          General: Normal range of motion.      Cervical back: Normal, normal range of motion and neck supple.      Thoracic back: Tenderness  (left thoracic paravertebrals TTP without erythema/edema) and bony tenderness  present. No swelling, edema or deformity. Normal range of motion.      Lumbar back: Normal. No swelling, edema, deformity or spasms.      Comments: No saddle anesthesia, negative SLR  b/l, full ROM of hip and knee, normal gait     Skin:  General: Skin is warm and dry.      Findings: No rash.    Neurological:       Mental Status: She is alert.      Gait: Gait normal.                 Diagnostic Study Results        Labs -   No results found for this or any previous visit (from the past 12 hour(s)).      Radiologic Studies -      XR SPINE THORAC 2 V       Final Result     Osteoporosis with compression fractures as described.                       Medical Decision Making     I am the first provider for this patient.      I reviewed the vital signs, available nursing notes, past medical history, past surgical history, family history and social history.      Vital Signs-Reviewed the patient's vital signs.      Records Reviewed: Nursing Notes and Old Medical Records (Time of Review: 8:45 AM)   Orthopedics visit 9/23:      1. Neck pain   -     AMB POC XRAY, SPINE, CERVICAL; 2 OR 3   -     pregabalin (LYRICA) 200 mg capsule; Take 1 Capsule by mouth three (3) times daily. Max Daily Amount: 600 mg.   2. Chronic midline thoracic back pain   -     AMB POC XRAY, SPINE; THORACIC, 2 VIEW   3. Thoracic  compression fracture, sequela   -     DEXA BONE DENSITY STUDY AXIAL; Future   4. Chronic pain syndrome   -     AMB SUPPLY ORDER   -     pregabalin (LYRICA) 200 mg capsule; Take 1 Capsule by mouth three (3) times daily. Max Daily Amount: 600 mg.   -     cyclobenzaprine (FLEXERIL) 5 mg tablet; TAKE 1 TABLET BY MOUTH EVERY DAY AT BEDTIME AS NEEDED   5. Other osteoporosis without current pathological fracture    -     DEXA BONE DENSITY STUDY AXIAL; Future   1.  Mary [name] is a 54 y.o. female with history of cervical fusion, multiple osteoporotic compression fractures, status post kyphoplasty  presenting with diffuse spine pain.  No focal radiculopathy or myelopathy on exam.  Multiple stressors including unstable home situation and psychiatric disease.   2.  DEXA bone density scan for evaluation of osteoporosis, she has multiple compression fractures.   3.  DME order for single point cane   4.  Dose adjustment. Increase Lyrica to 200 mg TID   5.  Refill Flexeril. Take QHS PRN for spasm      ED Course: Progress Notes, Reevaluation, and Consults:   10:30 AM  Reviewed results with patient. Discussed need for close outpatient follow-up with spine surgeon this week for reassessment. Discussed strict return precautions, including numbness, weakness,  or any other medical concerns. Pt in agreement with plan, ready to go home.       Provider Notes (Medical Decision Making): 54 year old female with history of chronic thoracic back pain and compression fractures who presents  to the ED due to acute on chronic pain.  No fall or trauma. No numbness/tingling, weakness. X-ray demonstrates osteoporosis with known compression fractures.  Pain controlled.  Stable  for discharge with close outpatient follow-up with spine surgeon, strict  return precautions.        Diagnosis        Clinical Impression:       1.  Acute midline thoracic back pain            Disposition: home         Follow-up Information               Follow up With   Specialties  Details  Why  Contact Info              [facility] EMERGENCY DEPT  Emergency Medicine    If symptoms worsen  [address]   [place] [address]   [phone]              [name], MD  Physical Medicine and Rehabilitation  Schedule an appointment as soon as possible for a visit     [address]   [address]   [place] [address]   [phone]                      Patient's Medications       Start Taking           HYDROCODONE-ACETAMINOPHEN (NORCO) 5-325 MG PER TABLET     Take 1 Tablet by mouth every six (6) hours as needed for Pain for up to 3 days. Max Daily Amount: 4 Tablets.           LIDOCAINE (LIDODERM) 5 %     Apply patch to the affected area for 12 hours a day and remove for 12 hours a day.       Continue Taking           ALBUTEROL (PROVENTIL HFA, VENTOLIN HFA, PROAIR HFA) 90 MCG/ACTUATION INHALER     Take 2 Puffs by inhalation every six (6) hours as needed.           ASPIRIN DELAYED-RELEASE 81 MG TABLET     Take 81 mg by mouth daily.           CETIRIZINE (ZYRTEC) 10 MG TABLET     Take 10 mg by mouth daily.           COMBIVENT RESPIMAT 20-100 MCG/ACTUATION INHALER     INHALE 1 PUFF BY MOUTH INTO LUNGS EVERY 6 HOURS           CYCLOBENZAPRINE (FLEXERIL) 5 MG TABLET     TAKE 1 TABLET BY MOUTH EVERY DAY AT BEDTIME AS NEEDED           FLUTICASONE PROPIONATE (FLONASE) 50 MCG/ACTUATION NASAL SPRAY     1 Spray by Nasal route daily.           FUROSEMIDE (LASIX) 40 MG TABLET     TAKE ONE TABLET BY MOUTH EVERY DAY           IPRATROPIUM-ALBUTEROL (COMBIVENT RESPIMAT) 20-100 MCG/ACTUATION INHALER     Take 1 Puff by inhalation.           LAMOTRIGINE (LAMICTAL) 25 MG TABLET     TAKE 3 TABLETS BY MOUTH ONCE DAILY           METOPROLOL SUCCINATE (TOPROL-XL) 25 MG XL TABLET                OMEPRAZOLE (PRILOSEC) 40 MG CAPSULE     Take 40 mg by mouth daily.  PRAVASTATIN (PRAVACHOL) 40 MG TABLET     Take 40 mg by mouth nightly.             PREDNISONE (STERAPRED DS) 10 MG DOSE PACK     As directed  x 6 days           PREGABALIN (LYRICA) 200 MG CAPSULE     Take 1 Capsule by mouth three (3) times daily. Max Daily Amount: 600 mg.           PSYLLIUM HUSK-SUCROSE 3.4 GRAM/7 GRAM POWD     Take 1 Packet by mouth daily.           WARFARIN (COUMADIN) 2.5 MG TABLET     TAKE 2 TABLETS BY MOUTH ONCE DAILY ON MONDAY WEDNESDAY FRIDAY AND TAKE 1 & 1 2 (ONE AND ONE HALF) ALL OTHER DAYS       These Medications have changed          No medications on file       Stop Taking          No medications on file           Dictation disclaimer:  Please note that this dictation was completed with Dragon, the computer voice recognition software.  Quite often unanticipated grammatical, syntax, homophones, and other  interpretive errors are inadvertently transcribed by the computer software.  Please disregard these errors.  Please excuse any errors that have escaped final proofreading.

## 2020-03-03 ENCOUNTER — Encounter

## 2020-03-03 NOTE — Progress Notes (Signed)
EDnotes and new thoracic xray reviewed. Slight worsening T10,11,12. Ordered new T spine for increased pain, poss. kypho.

## 2020-03-03 NOTE — Progress Notes (Signed)
Call pt and let her know that Dr. Wilford Corner reviewed her thoracic x-ray and has ordered a thoracic MRI.

## 2020-03-04 ENCOUNTER — Telehealth

## 2020-03-04 ENCOUNTER — Encounter

## 2020-03-04 NOTE — Telephone Encounter (Signed)
Patient called in to state she had to go to ED on 10/18 for back pain. Patient wanted to see if her appt scheduled (03/19/20) can be moved up ASAP. Patient would also like a pain med be sent to her pharmacy.     Patient has bone density scheduled (03/06/20) and thinks it should probably be rescheduled.     Requesting call back @: [phone]

## 2020-03-04 NOTE — Telephone Encounter (Signed)
Reviewed ER note.  Pt has already been ordered a thoracic MRI by Dr. Wilford Corner.  She needs to have this done prior to being seen by Dr. Wilford Corner.  We cannot give any prescription pain medication.

## 2020-03-04 NOTE — Progress Notes (Signed)
No new fx.

## 2020-03-05 NOTE — Telephone Encounter (Signed)
I called and spoke to the pt. The pt was identified using 2 pt identifiers. She was notified of the provider's response to her recent phone call. The pt verbalized understanding and states that she has not been contacted yet to schedule the MRI. She was going to wait until today to call them if she had not heard from them. The pt was advised to get the MRI scheduled and then call the office back with the date. Then we can try to change her follow up appt to an earlier date based on the MRI date. She verbalized understanding and will call back once she has this information.

## 2020-03-06 ENCOUNTER — Ambulatory Visit: Payer: MEDICARE | Primary: Family Medicine

## 2020-03-06 MED ORDER — DIAZEPAM 10 MG TAB
10 mg | ORAL_TABLET | ORAL | 0 refills | Status: DC
Start: 2020-03-06 — End: 2020-03-06

## 2020-03-06 MED ORDER — CYCLOBENZAPRINE 5 MG TAB
5 mg | ORAL_TABLET | Freq: Three times a day (TID) | ORAL | 0 refills | Status: DC | PRN
Start: 2020-03-06 — End: 2020-04-05

## 2020-03-06 MED ORDER — DIAZEPAM 10 MG TAB
10 mg | ORAL_TABLET | ORAL | 0 refills | Status: DC
Start: 2020-03-06 — End: 2020-04-27

## 2020-03-06 NOTE — Telephone Encounter (Signed)
Patient called stating her MRI is scheduled for 11/8, and her bone scan is 11/9. She is scheduled to come in 11/4. She wanted to know when she can follow up after. The next available is 12/1.    Patient can be reached at [phone].

## 2020-03-06 NOTE — Telephone Encounter (Signed)
Pt was contacted and notified of the new prescriptions and where they were sent. She verbalized understanding. No questions or concerns voiced at this time.

## 2020-03-06 NOTE — Telephone Encounter (Signed)
Patient called in again in regards to the previous message. Patient is also requesting a rx for pain medication to help with pain she's experiencing. Patient's contact is [phone]

## 2020-03-06 NOTE — Telephone Encounter (Signed)
I called and spoke to the pt about her request. The pt was identified using 2 pt identifiers. Pt was made aware that the provider is not in the office on Fridays and will not be able to review her message until Monday. The earliest appt I could offer the pt after her tests was 03/30/20 at 1545. She accepted the appt. The pt is also requesting something to take for her anxiety/claustrophobia for the upcoming MRI. Pt would also like something for pain. Message will be forwarded to provider for review. Pt will be called back once response received. No other questions or concerns voiced at this time.

## 2020-03-06 NOTE — Telephone Encounter (Signed)
Ok for Valium pre-MRI, won't need for DEXA.  Increase Flexeril to TID. Cont Lyrica 200mg  TID.    Rx x 2 e-scribed.

## 2020-03-09 ENCOUNTER — Telehealth

## 2020-03-09 MED ORDER — HYDROCODONE-ACETAMINOPHEN 5 MG-325 MG TAB
5-325 mg | ORAL_TABLET | Freq: Four times a day (QID) | ORAL | 0 refills | Status: AC | PRN
Start: 2020-03-09 — End: 2020-03-16

## 2020-03-09 NOTE — Telephone Encounter (Signed)
I e-scribed  some Norco. PMP reviewed.  Changed the T MRI to STAT, worsening T10 on xray  Put in for referral to IR for poss kypho T10

## 2020-03-09 NOTE — Telephone Encounter (Signed)
Patient called requesting a call back from Dr. Wilford Corner. She stated she is in so much pain she cannot put her bra on. She stated her medication Flexeril is helping her sleep better, but it is not helping with the pain. She stated the only relief she gets is from laying down, and she cannot lay down all day. She stated she will go back to the emergency room if she cannot get help.    Please advise patient at [phone].

## 2020-03-09 NOTE — Telephone Encounter (Signed)
Dr Wilford Corner,    Pt with compression fractures.  Has been getting small of amounts of opioids from ER per PMP.  Do you want to put her on anything else.  Is on lyrica currently.

## 2020-03-09 NOTE — Telephone Encounter (Signed)
Pt was contacted and notified of the prescription sent over to the pharmacy. She was also made aware that the MRI for the thoracic spine was changed from routine to stat. She will be looking out for a call from central scheduling for this. No questions or concerns voiced by the pt at this time.

## 2020-03-10 ENCOUNTER — Encounter

## 2020-03-10 ENCOUNTER — Inpatient Hospital Stay: Admit: 2020-03-10 | Payer: MEDICARE | Attending: Physical Medicine & Rehabilitation | Primary: Family Medicine

## 2020-03-10 DIAGNOSIS — S22070A Wedge compression fracture of T9-T10 vertebra, initial encounter for closed fracture: Secondary | ICD-10-CM

## 2020-03-10 DIAGNOSIS — M546 Pain in thoracic spine: Secondary | ICD-10-CM | POA: Diagnosis not present

## 2020-03-11 ENCOUNTER — Encounter: Payer: MEDICARE | Attending: Physical Medicine & Rehabilitation | Primary: Family Medicine

## 2020-03-12 NOTE — Telephone Encounter (Signed)
Attempted to contact about her message. She was not able to be reached. A message was left asking for a return call to the office at her earliest convenience so that we can get her another appt with Dr. Wilford Corner. Next available was 11/18. The number to the office was provided. No pt names were used in the message for HIPAA protection.

## 2020-03-12 NOTE — Telephone Encounter (Signed)
Patient took a Flexeril yesterday prior to her scheduled appt and fell asleep missing her appt to review her MRI.  She apologized for this.  She is asking if she can be fit in for another appt.  She was thinking she still had the one scheduled for 03/30/20, but that was cancelled when yesterday's was made.  She will still need to review the bone scan results.  Please advise if this is possible.    Patient [id number]

## 2020-03-19 ENCOUNTER — Encounter: Attending: Physical Medicine & Rehabilitation | Primary: Family Medicine

## 2020-03-23 ENCOUNTER — Telehealth

## 2020-03-23 ENCOUNTER — Ambulatory Visit: Payer: MEDICARE | Primary: Family Medicine

## 2020-03-23 NOTE — Telephone Encounter (Signed)
Patient called to request pain medication. Patient states she is in constant pain and needs something to hold her until she goes to appointment.    Requesting call back: [phone]    Confirmed pharmacy: [facility] [address]

## 2020-03-23 NOTE — Telephone Encounter (Signed)
I can give her an acute rx of ultram if she would like that

## 2020-03-23 NOTE — Telephone Encounter (Signed)
She was given a one time rx for norco.  It is not re-fillable.

## 2020-03-23 NOTE — Telephone Encounter (Signed)
Pt was called and notified of the provider's response to her request. She verbalized understanding and is asking if any other medication can be called in to help her discomfort. She has the bone scan scheduled for tomorrow and is scheduled for the interventional radiology appt on 04/03/20. The day after she is scheduled to see Dr. Wilford Corner on 04/02/20. Message will be routed to provider for review. Pt aware. No questions or concerns voiced at this time.

## 2020-03-24 ENCOUNTER — Encounter

## 2020-03-24 ENCOUNTER — Ambulatory Visit: Payer: MEDICARE | Primary: Family Medicine

## 2020-03-24 MED ORDER — TRAMADOL 50 MG TAB
50 mg | ORAL_TABLET | ORAL | 0 refills | Status: AC | PRN
Start: 2020-03-24 — End: 2020-03-31

## 2020-03-24 NOTE — Telephone Encounter (Signed)
sent 

## 2020-03-24 NOTE — Telephone Encounter (Signed)
Patient called regarding the message thread from yesterday 03/23/20. She says her daughter tried to go to her pharmacy to pick up the medication but they said nothing was there for her. She confirmed her pharmacy is [facility] on [address] and she's requesting to be called to let her know when we send it over at [phone].

## 2020-03-24 NOTE — Telephone Encounter (Signed)
Pt called and notified. The pt was identified using 2 pt identifiers. Pt verbalized understanding and is aware of pharmacy location.

## 2020-03-24 NOTE — Telephone Encounter (Signed)
I resent the ultram.  I notified the patient

## 2020-03-24 NOTE — Telephone Encounter (Signed)
I called and spoke to [name]. She was asked if she would like to have a prescription for some tramadol to help with her pain. The pt states that she will take anything that will help her pain. She confirmed her pharmacy over the phone. She is aware that it will be a small prescription. Pt will be notified once the medication has been sent over to the pharmacy.

## 2020-03-25 DIAGNOSIS — I480 Paroxysmal atrial fibrillation: Secondary | ICD-10-CM | POA: Diagnosis not present

## 2020-03-25 DIAGNOSIS — Z7901 Long term (current) use of anticoagulants: Secondary | ICD-10-CM | POA: Diagnosis not present

## 2020-03-30 ENCOUNTER — Encounter: Attending: Physical Medicine & Rehabilitation | Primary: Family Medicine

## 2020-04-02 ENCOUNTER — Encounter: Attending: Physical Medicine & Rehabilitation | Primary: Family Medicine

## 2020-04-03 NOTE — Telephone Encounter (Signed)
 Patient called very upset and crying asking why provider cancelled her appt today with the doctor for cement in her back at [facility].  She is reluctant to call her medicaid transportation because she is still hopeful she can attend this appt.   I tried to assist, but I am confused as to who, what, or where.  She had an appt that was missed yesterday with us  for follow up.  I do not see communication regarding cancelling a procedure for her.  Please return call to clarify.     Patient [phone]

## 2020-04-03 NOTE — Telephone Encounter (Signed)
I tried contacting Dr. Jaquita Folds office to see why the appt was canceled for today for the pt. Judeth Cornfield is currently in clinic and not available. A message was left for her to call the office back so that we can get clarification on this appt. The number was provided to the office. I called and spoke to the pt. The pt was identified using 2 pt identifiers. She states that she is not sure why it was canceled. Pt was advised that I will contact her when I get the rest of the info from radiology. She verbalized understanding and has no questions at this time.

## 2020-04-05 ENCOUNTER — Inpatient Hospital Stay
Admit: 2020-04-05 | Discharge: 2020-04-05 | Disposition: A | Discharge status: Home / Self Care | Payer: MEDICARE | Attending: Emergency Medicine

## 2020-04-05 ENCOUNTER — Emergency Department: Admit: 2020-04-05 | Payer: MEDICARE | Primary: Family Medicine

## 2020-04-05 ENCOUNTER — Emergency Department: Payer: MEDICARE | Primary: Family Medicine

## 2020-04-05 DIAGNOSIS — R0789 Other chest pain: Secondary | ICD-10-CM

## 2020-04-05 DIAGNOSIS — F17200 Nicotine dependence, unspecified, uncomplicated: Secondary | ICD-10-CM | POA: Diagnosis not present

## 2020-04-05 DIAGNOSIS — Z7982 Long term (current) use of aspirin: Secondary | ICD-10-CM | POA: Diagnosis not present

## 2020-04-05 DIAGNOSIS — R079 Chest pain, unspecified: Secondary | ICD-10-CM | POA: Diagnosis not present

## 2020-04-05 DIAGNOSIS — I509 Heart failure, unspecified: Secondary | ICD-10-CM | POA: Diagnosis not present

## 2020-04-05 DIAGNOSIS — Z79899 Other long term (current) drug therapy: Secondary | ICD-10-CM | POA: Diagnosis not present

## 2020-04-05 DIAGNOSIS — J449 Chronic obstructive pulmonary disease, unspecified: Secondary | ICD-10-CM | POA: Diagnosis not present

## 2020-04-05 DIAGNOSIS — R0781 Pleurodynia: Secondary | ICD-10-CM | POA: Diagnosis not present

## 2020-04-05 DIAGNOSIS — R791 Abnormal coagulation profile: Secondary | ICD-10-CM | POA: Diagnosis not present

## 2020-04-05 LAB — CBC WITH AUTOMATED DIFF
ABS. BASOPHILS: 0.1 10*3/uL (ref 0.0–0.1)
ABS. EOSINOPHILS: 0.1 10*3/uL (ref 0.0–0.4)
ABS. IMM. GRANS.: 0 10*3/uL (ref 0.00–0.04)
ABS. LYMPHOCYTES: 2.4 10*3/uL (ref 0.9–3.6)
ABS. MONOCYTES: 0.3 10*3/uL (ref 0.05–1.2)
ABS. NEUTROPHILS: 2.5 10*3/uL (ref 1.8–8.0)
ABSOLUTE NRBC: 0 10*3/uL (ref 0.00–0.01)
BASOPHILS: 1 % (ref 0–2)
EOSINOPHILS: 2 % (ref 0–5)
HCT: 44.7 % (ref 35.0–45.0)
HGB: 14.7 g/dL (ref 12.0–16.0)
IMMATURE GRANULOCYTES: 0 % (ref 0.0–0.5)
LYMPHOCYTES: 45 % (ref 21–52)
MCH: 29.5 PG (ref 24.0–34.0)
MCHC: 32.9 g/dL (ref 31.0–37.0)
MCV: 89.6 FL (ref 78.0–100.0)
MONOCYTES: 6 % (ref 3–10)
MPV: 9.9 FL (ref 9.2–11.8)
NEUTROPHILS: 46 % (ref 40–73)
NRBC: 0 PER 100 WBC
PLATELET: 268 10*3/uL (ref 135–420)
RBC: 4.99 M/uL (ref 4.20–5.30)
RDW: 13.2 % (ref 11.6–14.5)
WBC: 5.5 10*3/uL (ref 4.6–13.2)

## 2020-04-05 LAB — METABOLIC PANEL, COMPREHENSIVE
A-G Ratio: 1.1 (ref 0.8–1.7)
ALT (SGPT): 21 U/L (ref 13–56)
AST (SGOT): 17 U/L (ref 10–38)
Albumin: 4.2 g/dL (ref 3.4–5.0)
Alk. phosphatase: 61 U/L (ref 45–117)
Anion gap: 4 mmol/L (ref 3.0–18)
BUN/Creatinine ratio: 16 (ref 12–20)
BUN: 15 MG/DL (ref 7.0–18)
Bilirubin, total: 0.4 MG/DL (ref 0.2–1.0)
CO2: 31 mmol/L (ref 21–32)
Calcium: 10 MG/DL (ref 8.5–10.1)
Chloride: 105 mmol/L (ref 100–111)
Creatinine: 0.93 MG/DL (ref 0.6–1.3)
GFR est AA: >60 mL/min/{1.73_m2} (ref >60)
GFR est non-AA: >60 mL/min/{1.73_m2} (ref >60)
Globulin: 3.7 g/dL (ref 2.0–4.0)
Glucose: 91 mg/dL (ref 74–99)
Potassium: 3.8 mmol/L (ref 3.5–5.5)
Protein, total: 7.9 g/dL (ref 6.4–8.2)
Sodium: 140 mmol/L (ref 136–145)

## 2020-04-05 LAB — PROTHROMBIN TIME + INR
INR: 1.8 — ABNORMAL HIGH (ref 0.8–1.2)
Prothrombin time: 20.6 s — ABNORMAL HIGH (ref 11.5–15.2)

## 2020-04-05 LAB — EKG, 12 LEAD, INITIAL
Atrial Rate: 70 {beats}/min
Calculated P Axis: 80 degrees
Calculated R Axis: 57 degrees
Calculated T Axis: 50 degrees
Diagnosis: NORMAL
P-R Interval: 154 ms
Q-T Interval: 416 ms
QRS Duration: 88 ms
QTC Calculation (Bezet): 449 ms
Ventricular Rate: 70 {beats}/min

## 2020-04-05 LAB — EKG, 12 LEAD, SUBSEQUENT
Atrial Rate: 53 {beats}/min
Calculated P Axis: 81 degrees
Calculated R Axis: 34 degrees
Calculated T Axis: 54 degrees
P-R Interval: 152 ms
Q-T Interval: 444 ms
QRS Duration: 102 ms
QTC Calculation (Bezet): 416 ms
Ventricular Rate: 53 {beats}/min

## 2020-04-05 LAB — TROPONIN I: Troponin-I, QT: <0.02 NG/ML (ref 0.0–0.045)

## 2020-04-05 LAB — NT-PRO BNP: NT pro-BNP: 506 PG/ML (ref 0–900)

## 2020-04-05 LAB — CARDIAC PANEL,(CK, CKMB & TROPONIN)
CK - MB: 1.9 ng/ml (ref <3.6)
CK-MB Index: 0.9 % (ref 0.0–4.0)
CK: 211 U/L — ABNORMAL HIGH (ref 26–192)
Troponin-I, QT: <0.02 NG/ML (ref 0.0–0.045)

## 2020-04-05 LAB — MAGNESIUM
Magnesium: 2.3 mg/dL (ref 1.6–2.6)
Magnesium: 2.3 mg/dL (ref 1.6–2.6)

## 2020-04-05 LAB — EKG 12-LEAD
Atrial Rate: 53 {beats}/min
Atrial Rate: 70 {beats}/min
Diagnosis: NORMAL
P Axis: 80 degrees
P Axis: 81 degrees
P-R Interval: 152 ms
P-R Interval: 154 ms
Q-T Interval: 416 ms
Q-T Interval: 444 ms
QRS Duration: 102 ms
QRS Duration: 88 ms
QTc Calculation (Bazett): 416 ms
QTc Calculation (Bazett): 449 ms
R Axis: 34 degrees
R Axis: 57 degrees
T Axis: 50 degrees
T Axis: 54 degrees
Ventricular Rate: 53 {beats}/min
Ventricular Rate: 70 {beats}/min

## 2020-04-05 LAB — CBC WITH AUTO DIFFERENTIAL
Basophils %: 1 % (ref 0–2)
Basophils Absolute: 0.1 10*3/uL (ref 0.0–0.1)
Eosinophils %: 2 % (ref 0–5)
Eosinophils Absolute: 0.1 10*3/uL (ref 0.0–0.4)
Granulocyte Absolute Count: 0 10*3/uL (ref 0.00–0.04)
Hematocrit: 44.7 % (ref 35.0–45.0)
Hemoglobin: 14.7 g/dL (ref 12.0–16.0)
Immature Granulocytes: 0 % (ref 0.0–0.5)
Lymphocytes %: 45 % (ref 21–52)
Lymphocytes Absolute: 2.4 10*3/uL (ref 0.9–3.6)
MCH: 29.5 PG (ref 24.0–34.0)
MCHC: 32.9 g/dL (ref 31.0–37.0)
MCV: 89.6 FL (ref 78.0–100.0)
MPV: 9.9 FL (ref 9.2–11.8)
Monocytes %: 6 % (ref 3–10)
Monocytes Absolute: 0.3 10*3/uL (ref 0.05–1.2)
NRBC Absolute: 0 10*3/uL (ref 0.00–0.01)
Neutrophils %: 46 % (ref 40–73)
Neutrophils Absolute: 2.5 10*3/uL (ref 1.8–8.0)
Nucleated RBCs: 0 PER 100 WBC
Platelets: 268 10*3/uL (ref 135–420)
RBC: 4.99 M/uL (ref 4.20–5.30)
RDW: 13.2 % (ref 11.6–14.5)
WBC: 5.5 10*3/uL (ref 4.6–13.2)

## 2020-04-05 LAB — COMPREHENSIVE METABOLIC PANEL
ALT: 21 U/L (ref 13–56)
AST: 17 U/L (ref 10–38)
Albumin/Globulin Ratio: 1.1 (ref 0.8–1.7)
Albumin: 4.2 g/dL (ref 3.4–5.0)
Alkaline Phosphatase: 61 U/L (ref 45–117)
Anion Gap: 4 mmol/L (ref 3.0–18)
BUN: 15 MG/DL (ref 7.0–18)
Bun/Cre Ratio: 16 (ref 12–20)
CO2: 31 mmol/L (ref 21–32)
Calcium: 10 MG/DL (ref 8.5–10.1)
Chloride: 105 mmol/L (ref 100–111)
Creatinine: 0.93 MG/DL (ref 0.6–1.3)
EGFR IF NonAfrican American: >60 mL/min/{1.73_m2} (ref >60)
GFR African American: >60 mL/min/{1.73_m2} (ref >60)
Globulin: 3.7 g/dL (ref 2.0–4.0)
Glucose: 91 mg/dL (ref 74–99)
Potassium: 3.8 mmol/L (ref 3.5–5.5)
Sodium: 140 mmol/L (ref 136–145)
Total Bilirubin: 0.4 MG/DL (ref 0.2–1.0)
Total Protein: 7.9 g/dL (ref 6.4–8.2)

## 2020-04-05 LAB — TROPONIN: Troponin I: <0.02 NG/ML (ref 0.0–0.045)

## 2020-04-05 LAB — PROBNP, N-TERMINAL: BNP: 506 PG/ML (ref 0–900)

## 2020-04-05 LAB — PROTIME-INR
INR: 1.8 — ABNORMAL HIGH (ref 0.8–1.2)
Protime: 20.6 s — ABNORMAL HIGH (ref 11.5–15.2)

## 2020-04-05 LAB — CARDIAC PANEL
CK-MB Index: 0.9 % (ref 0.0–4.0)
CK-MB: 1.9 ng/ml (ref <3.6)
Total CK: 211 U/L — ABNORMAL HIGH (ref 26–192)
Troponin I: <0.02 NG/ML (ref 0.0–0.045)

## 2020-04-05 MED ORDER — IOPAMIDOL 76 % IV SOLN
76 % | Freq: Once | INTRAVENOUS | Status: AC
Start: 2020-04-05 — End: 2020-04-05
  Administered 2020-04-05: 17:00:00 via INTRAVENOUS

## 2020-04-05 MED ORDER — TRAMADOL 50 MG TAB
50 mg | ORAL | Status: AC
Start: 2020-04-05 — End: 2020-04-05
  Administered 2020-04-05: 15:00:00 via ORAL

## 2020-04-05 MED FILL — ISOVUE-370  76 % INTRAVENOUS SOLUTION: 370 mg iodine /mL (76 %) | INTRAVENOUS | Qty: 100

## 2020-04-05 MED FILL — TRAMADOL 50 MG TAB: 50 mg | ORAL | Qty: 1

## 2020-04-05 NOTE — ED Notes (Signed)
Pt c/o left sided pain in left side of chest behind breast x 4 days that has been gradually been getting worse.

## 2020-04-05 NOTE — ED Notes (Signed)
Pt given discharge instructions. Pt verbalized understanding of instructions.

## 2020-04-05 NOTE — ED Provider Notes (Signed)
ED Provider Notes by Charma Igo, DO at 04/05/20 0902                Author: Charma Igo, DO  Service: EMERGENCY  Author Type: Physician       Filed: 04/05/20 1312  Date of Service: 04/05/20 0902  Status: Signed          Editor: Charma Igo, DO (Physician)               [place]           Patient Name: Mary [name]        History of Presenting Illness        HPI:       54 year old female with a past medical history of asthma, anxiety, bipolar disorder, COPD, aortic valve replacement, congestive heart failure, afib on warfarin s/p mitraclip (all cardiac procedures done in [place], currently trying to establish cardiac care  in this area) presents to the ED via EMS for evaluation of 4-day history of left-sided chest pain. She was given 324 ASA by EMS prior to arrival.  Patient reports the pain is there constantly and made worse with palpation to the area or deep inspiration.   She states it is on the left anterior axillary line at the level of her breast.  Nonradiating.  Reports it makes it more difficult for her to smoke cigarettes.  She denies any wheezing, cough, dyspnea, syncope, presyncope, fevers, chills.      Of note, patient was recently diagnosed with a thoracic depression fracture and she states she does not know if this pain is from that or if it is cardiac related.         PCP: None        No current facility-administered medications on file prior to encounter.          Current Outpatient Medications on File Prior to Encounter          Medication  Sig  Dispense  Refill           ?  diazePAM (Valium) 10 mg tablet  Take 30-60 minutes prior to procedure. Must have driver.  1 Tablet  0     ?  [DISCONTINUED] cyclobenzaprine (FLEXERIL) 5 mg tablet  Take 1 Tablet by mouth three (3) times daily as needed for Muscle Spasm(s).  90 Tablet  0     ?  lidocaine (Lidoderm) 5 %  Apply patch to the affected area for 12 hours a day and remove  for 12 hours a day.  30 Each  0     ?  albuterol (PROVENTIL HFA, VENTOLIN HFA, PROAIR HFA) 90 mcg/actuation inhaler  Take 2 Puffs by inhalation every six (6) hours as needed.         ?  aspirin delayed-release 81 mg tablet  Take 81 mg by mouth daily.         ?  cetirizine (ZYRTEC) 10 mg tablet  Take 10 mg by mouth daily.         ?  fluticasone propionate (FLONASE) 50 mcg/actuation nasal spray  1 Spray by Nasal route daily.         ?  furosemide (LASIX) 40 mg tablet  TAKE ONE TABLET BY MOUTH EVERY DAY         ?  ipratropium-albuteroL (Combivent Respimat) 20-100 mcg/actuation inhaler  Take 1 Puff by inhalation.         ?  Combivent Respimat  20-100 mcg/actuation inhaler  INHALE 1 PUFF BY MOUTH INTO LUNGS EVERY 6 HOURS         ?  lamoTRIgine (LaMICtal) 25 mg tablet  TAKE 3 TABLETS BY MOUTH ONCE DAILY         ?  metoprolol succinate (TOPROL-XL) 25 mg XL tablet           ?  omeprazole (PRILOSEC) 40 mg capsule  Take 40 mg by mouth daily.         ?  pravastatin (PRAVACHOL) 40 mg tablet  Take 40 mg by mouth nightly.           ?  predniSONE (STERAPRED DS) 10 mg dose pack  As directed x 6 days         ?  Psyllium Husk-Sucrose 3.4 gram/7 gram powd  Take 1 Packet by mouth daily.         ?  warfarin (COUMADIN) 2.5 mg tablet  TAKE 2 TABLETS BY MOUTH ONCE DAILY ON MONDAY WEDNESDAY FRIDAY AND TAKE 1 & 1 2 (ONE AND ONE HALF) ALL OTHER DAYS               ?  pregabalin (LYRICA) 200 mg capsule  Take 1 Capsule by mouth three (3) times daily. Max Daily Amount: 600 mg.  90 Capsule  2             Past History        Past Medical History:     Past Medical History:        Diagnosis  Date         ?  Anxiety       ?  Asthma       ?  Bipolar 1 disorder ([place])       ?  CHF (congestive heart failure) ([place])       ?  COPD (chronic obstructive pulmonary disease) ([place])       ?  H/O aortic valve replacement           ?  PTSD (post-traumatic stress disorder)             Past Surgical History:   No past surgical history on file.      Family History:   No  family history on file.      Social History:     Social History          Tobacco Use         ?  Smoking status:  Current Every Day Smoker              Packs/day:  1.00         ?  Smokeless tobacco:  Never Used       Substance Use Topics         ?  Alcohol use:  Not Currently     ?  Drug use:  Yes              Types:  Marijuana           Allergies:     Allergies        Allergen  Reactions         ?  Adhesive  Rash         ?  Lithium  Nausea and Vomiting           Review of Nursing Notes: I have reviewed the relevant nursing notes that were available at the time of this  entry. Portions of the Family and  Social history, as well as medications and allergies, may have been entered into my documentation by nursing or other ancillary staff; I have confirmed and may have supplemented that information to the best of my ability at the time the note was reviewed.  There are some disagreements between the nursing notes and my evaluation at times. Some of the above referenced nursing documentation appears in my note after completion of my review.        Review of Systems       CONSTITUTIONAL: Denies fevers, chills, sweats, or weight changes.   EYES: Denies any visual changes or symptoms  HENT: Denies headaches, changes to hearing,  rhinitis, sore throat, dysphagia, or change in voice.  CV: Denies palpitations.   Lungs/Chest: Denies dyspnea, wheezing, or cough.  GI: Denies nausea, vomiting, diarrhea,  constipation, abdominal pain, hematochezia, or melena.  GU: Denies urinary retention or incontinence. No genital discharge. No dysuria.   MSK: Denies myalgias or arthralgias.  NEURO: Denies chronic headaches or seizures.  No numbness, tingling or weakness.  PSYCHIATRIC: Denies problems with mood disturbance and anxiety.   DERMATOLOGIC: Denies any rashes or skin changes.       Physical Exam       General: In no apparent distress. Well-nourished/well-developed.   Head/Neck: Normocephalic, atraumatic. Nontender midline neck, normal  ROM.  EENT:  PERRLA. EOM intact bilaterally. Oropharyngeal mucosa is moist, lesions or erythema. No nasal discharge.   Cardiovascular: RRR, no murmurs,  gallops, or rubs. Peripheral pulses normal and intact in BUE and BLE.  Lungs/Chest: CTAB, no wheezing, rhonchi, or rales. Tenderness  to palpation to the left anterior axillary line at the level of the inframammary fold.  Abdomen: No distention. No organomegaly. No  rebound, rigidity, or guarding.  Nontender.  Extremities/Skin: Warm distal extremities No deformities. No edema. No rashes.    Neuro: A&O x 3. Grossly intact sensations and motor function in upper and lower extremities bilaterally.  Psych:  Appropriate mood and affect.        Diagnostic Study Results        Labs -         Recent Results (from the past 12 hour(s))     CBC WITH AUTOMATED DIFF          Collection Time: 04/05/20  9:22 AM         Result  Value  Ref Range            WBC  5.5  4.6 - 13.2 K/uL       RBC  4.99  4.20 - 5.30 M/uL       HGB  14.7  12.0 - 16.0 g/dL       HCT  44.7  35.0 - 45.0 %       MCV  89.6  78.0 - 100.0 FL       MCH  29.5  24.0 - 34.0 PG       MCHC  32.9  31.0 - 37.0 g/dL       RDW  13.2  11.6 - 14.5 %       PLATELET  268  135 - 420 K/uL       MPV  9.9  9.2 - 11.8 FL       NRBC  0.0  0 PER 100 WBC       ABSOLUTE NRBC  0.00  0.00 - 0.01 K/uL       NEUTROPHILS  46  40 - 73 %       LYMPHOCYTES  45  21 - 52 %       MONOCYTES  6  3 - 10 %       EOSINOPHILS  2  0 - 5 %       BASOPHILS  1  0 - 2 %       IMMATURE GRANULOCYTES  0  0.0 - 0.5 %       ABS. NEUTROPHILS  2.5  1.8 - 8.0 K/UL       ABS. LYMPHOCYTES  2.4  0.9 - 3.6 K/UL       ABS. MONOCYTES  0.3  0.05 - 1.2 K/UL       ABS. EOSINOPHILS  0.1  0.0 - 0.4 K/UL       ABS. BASOPHILS  0.1  0.0 - 0.1 K/UL       ABS. IMM. GRANS.  0.0  0.00 - 0.04 K/UL       DF  AUTOMATED          METABOLIC PANEL, COMPREHENSIVE          Collection Time: 04/05/20  9:22 AM         Result  Value  Ref Range            Sodium  140  136 - 145 mmol/L        Potassium  3.8  3.5 - 5.5 mmol/L       Chloride  105  100 - 111 mmol/L       CO2  31  21 - 32 mmol/L       Anion gap  4  3.0 - 18 mmol/L       Glucose  91  74 - 99 mg/dL       BUN  15  7.0 - 18 MG/DL       Creatinine  0.93  0.6 - 1.3 MG/DL       BUN/Creatinine ratio  16  12 - 20         GFR est AA  >60  >60 ml/min/1.63m       GFR est non-AA  >60  >60 ml/min/1.771m      Calcium  10.0  8.5 - 10.1 MG/DL       Bilirubin, total  0.4  0.2 - 1.0 MG/DL       ALT (SGPT)  21  13 - 56 U/L       AST (SGOT)  17  10 - 38 U/L       Alk. phosphatase  61  45 - 117 U/L       Protein, total  7.9  6.4 - 8.2 g/dL       Albumin  4.2  3.4 - 5.0 g/dL       Globulin  3.7  2.0 - 4.0 g/dL       A-G Ratio  1.1  0.8 - 1.7         NT-PRO BNP          Collection Time: 04/05/20  9:22 AM         Result  Value  Ref Range            NT pro-BNP  506  0 - 900 PG/ML       MAGNESIUM          Collection Time: 04/05/20  9:22 AM  Result  Value  Ref Range            Magnesium  2.3  1.6 - 2.6 mg/dL       CARDIAC PANEL,(CK, CKMB & TROPONIN)          Collection Time: 04/05/20  9:22 AM         Result  Value  Ref Range            CK - MB  1.9  <3.6 ng/ml       CK-MB Index  0.9  0.0 - 4.0 %       CK  211 (H)  26 - 192 U/L       Troponin-I, QT  <0.02  0.0 - 0.045 NG/ML       PROTHROMBIN TIME + INR          Collection Time: 04/05/20  9:22 AM         Result  Value  Ref Range            Prothrombin time  20.6 (H)  11.5 - 15.2 sec       INR  1.8 (H)  0.8 - 1.2         TROPONIN I          Collection Time: 04/05/20 12:38 PM         Result  Value  Ref Range            Troponin-I, QT  <0.02  0.0 - 0.045 NG/ML       EKG, 12 LEAD, INITIAL          Collection Time: 04/05/20 12:40 PM         Result  Value  Ref Range            Ventricular Rate  70  BPM       Atrial Rate  70  BPM       P-R Interval  154  ms       QRS Duration  88  ms       Q-T Interval  416  ms       QTC Calculation (Bezet)  449  ms       Calculated P Axis  80  degrees       Calculated R Axis  57   degrees       Calculated T Axis  50  degrees       Diagnosis                 Normal sinus rhythm   Nonspecific ST and T wave abnormality   Abnormal ECG   When compared with ECG of 26-Dec-2019 10:54,   No significant change was found              Radiologic Studies -      CTA CHEST W OR W WO CONT       Final Result          1. No pulmonary embolism.          2. Diffuse bronchial wall thickening likely reflects chronic bronchitis. Mild     emphysema.          3. Minimal contrast reflux into the IVC/hepatic veins, can be seen with elevated     right heart pressure.          4. Small pulmonary nodules measuring up to 5 mm.        Fleischner Society  recommendation for management of multiple incidental solid       pulmonary nodules < 6 mm is no routine follow up in low risk patients and     optional CT follow up at 12 months in high risk patients.           5. Partially visualized small right renal lesion, most likely a cyst, not     significantly changed from 12/27/2019.                                XR CHEST PORT       Final Result      IMPRESSION:          1. May resent sequela of early congestion. Follow-up with plain imaging.                 CT Results   (Last 48 hours)                                    04/05/20 1143    CTA CHEST W OR W WO CONT  Final result            Impression:           1. No pulmonary embolism.             2. Diffuse bronchial wall thickening likely reflects chronic bronchitis. Mild      emphysema.             3. Minimal contrast reflux into the IVC/hepatic veins, can be seen with elevated      right heart pressure.             4. Small pulmonary nodules measuring up to 5 mm.       Fleischner Society recommendation for management of multiple incidental solid      pulmonary nodules < 6 mm is no routine follow up in low risk patients and      optional CT follow up at 12 months in high risk patients.              5. Partially visualized small right renal lesion, most likely a cyst, not       significantly changed from 12/27/2019.                                                   Narrative:    EXAM: CTA CHEST PULMONARY EMBOLISM              CLINICAL INDICATION/HISTORY: pleuritic left chest pain, r/o PE. Left-sided chest      pain for 4 days, gradually worsening.             COMPARISON:  Chest radiograph earlier today             TECHNIQUE: CTA of the chest was performed using timing optimized for pulmonary      embolism technique, with IV contrast.  To maximize sensitivity the sagittal and      coronal reconstructions were created using a 3D multislice MIP (maximal      intensity projection) methodology.      CT scans at this facility are performed using dose optimization technique as  appropriate to the performed exam, to include automated exposure control,      adjustment of the mA and/or kV according to patient size (including appropriate      matching for site specific examinations), or use of iterative reconstruction      technique.                    FINDINGS:             Lung/Airway:  No evidence for pneumonia or pleural effusion. Mild atelectasis      medial right middle lobe and left lower lobe. Diffuse bronchial wall thickening.      Mild emphysematous changes. 5 mm right lower lobe nodule (3, 37). 4 mm      subpleural nodule medial right lower lobe (2, 128).             Lower neck: Unremarkable.             Mediastinum:  No mediastinal lymphadenopathy.             Pulmonary arteries (includes assessment of MIP images): Main pulmonary artery      measures 2.5 cm. No filling defect is seen in the main, lobar, or visualized      segmental pulmonary arteries.               Aorta and other cardiovascular structures: No aortic aneurysm or dissection.      Left atrial appendage clip. Mitral valve replacement.      Heart Strain assessment:      -  RV/LV ratio (normal <0.9): Normal      -  Dysfunction or bowing of interventricular septum: None      -  There is minimal visualization of contrast  reflux from the right heart into      the IVC/hepatic veins.             Upper abdominal structures: Partially visualized hypodense lesion at the      posterior right kidney measures at least 1.6 cm, incompletely characterized, but      likely a cyst.             Chest wall: Unremarkable.             Bones: External fixation hardware. Partially visualized ACDF. Prior      vertebroplasty T8. Compression deformities T12-L1, unchanged from prior      radiographs.                                        CXR Results   (Last 48 hours)                                    04/05/20 0941    XR CHEST PORT  Final result            Impression:     IMPRESSION:             1. May resent sequela of early congestion. Follow-up with plain imaging.                       Narrative:    HISTORY: Chest pain.             EXAM: Chest.  TECHNIQUE: Single view portable upright chest.              COMPARISON: 03/02/2020 and 12/26/2019             FINDINGS: Minimal bilateral diffuse interstitial prominence is demonstrated      without edema thorax pneumonia or pleural effusions. Heart and mediastinal      structures are unchanged. Heart is enlarged.. Visualized bony thorax and soft      tissues are within normal limits.                                    Medical Decision Making        I am the first provider for this patient.       Vital Signs- I personally reviewed and interpreted the patient's vital signs.   Patient Vitals for the past 12 hrs:            Temp  Pulse  Resp  BP  SpO2            04/05/20 1032  --  --  --  --  96 %            04/05/20 0930  98.2 ??F (36.8 ??C)  69  19  109/62  96 %           EKG interpretation: Sinus bradycardia.  53.  Normal axis.  Nonspecific ST/T changes.  No significant change from prior EKG earlier this  year.  No STEMI.   EKG read and interpreted by ED physician at 1017      Records Reviewed: I reviewed the patient's records to interpret any previous medical data available to me including EKGs,  previous medical  records, previous images, previous labs.      Provider Notes (Medical Decision Making):       54 year old female with a history of A. fib s/p mitraclip on warfarin, aortic valve replacement, COPD, CHF, presents to the ER via EMS for evaluation of left-sided chest pressure over the last 3 to  4 days.  Seems to be constant however exacerbated with certain movements and deep inspiration.  Her cardiologist is in [place].  States her PCP is with [facility].  EMS gave patient the need for ASA prior to arrival.      Upon my examination, patient is afebrile and overall well appearing. Hemodynamically normal. Neurovascularly intact.  She does have left anterior axillary line point tenderness however pain is also present at rest.      Given today's clinical picture, my differential diagnoses indlude: COPD, pneumonia, rib fracture, muscle strain, arrhythmia, ACS.      To further refine my diagnosis, I will order CBC, INR, CMP, cardiac enzymes, EKG, CXR. Will give patient her home dose of tramadol as she does have a recent thoracic compression fracture.      ED Course:         ED Course as of 04/05/20 1312       Sun Apr 05, 2020        1053  On reevaluation after tramadol, patient states the pain improved slightly however it is still there. States she feels it is more pleuritic at this  point. SPO2 96% on room air. Given her being subtherapeutic on her INR and her multiple underlying comorbidities including her extensive cardiac history, will obtain a CTA of the chest to ensure a pulmonary  embolus is not the reason for her pleuritic  chest pain that she presented to the ER for today. We will also plan for a 3-hour troponin and EKG. [EK]        1259  Repeat EKG within normal limits, no acute changes.  At this point, I do not think that this is cardiac related.  Her CTA is negative for a PE however,  she does have a pulmonary nodule on the right.  I discussed the case with the [facility]  family medicine resident who will evaluate the patient to ensure proper follow-up as an outpatient. [EK]        1301  Repeat troponin negative.  Family medicine team evaluated the patient and they will set up an appointment for her this week.      Patient remained stable throughout their ED course. I discussed relevant findings with patient. My plan is to discharge home with return precautions and PCP follow-up within two days. Additional verbal discharge instructions were given and discussed with  the patient. Patient expressed understanding, agrees to plan, and all of their questions were answered. [EK]              ED Course User Index   [EK] Rhiley Tarver, DO           Shaheem Pichon, DO   Date: 04/05/2020

## 2020-04-06 ENCOUNTER — Encounter

## 2020-04-06 MED ORDER — TRAMADOL 50 MG TAB
50 mg | ORAL_TABLET | Freq: Three times a day (TID) | ORAL | 0 refills | Status: AC | PRN
Start: 2020-04-06 — End: 2020-04-09

## 2020-04-06 NOTE — Telephone Encounter (Signed)
Patient called in regards to the previous message on 04/03/2020. Patient stated she is anxiously awaiting a call in regards to why her appt for cement in her back was cancelled. Patient stated she's also in pain and would like a refill request on tramadol(that was prescribed in the ER on 04/05/2020).    Patient is awaiting a cal back at [phone]

## 2020-04-06 NOTE — Telephone Encounter (Signed)
I called and spoke to [name]. The pt was identified using 2 pt identifiers. She was made aware of the provider's response to her recent request. The pt verbalized understanding and states that she has a follow up scheduled with her pcp on Wednesday to follow up for her recent ED visit yesterday. Pt was at the ER for chest pain. She will talk to her pcp about getting pain management referral. [name] states that she has now gotten her transportation status back up and running. I provided her the number to the scheduling dept for [facility] so that she can get her DEXA bone density test scheduled. The pt was instructed to contact the office once she has this scheduled so that we can make her an appt to follow up. She verbalized understanding and has no questions or concerns at this time.

## 2020-04-06 NOTE — Telephone Encounter (Signed)
I called and spoke to [facility] at Interventional Radiology regarding the pt's appt being canceled. She states that out providers spoke to Dr. Larry Sierras and canceled the appt. This procedure will not help the pt. Message will be routed to Dr. Wilford Corner for further review regarding the pt's request.

## 2020-04-06 NOTE — Telephone Encounter (Signed)
Dr. Larry Sierras called me last week and advised that there were no recent fractures on her recent MRI.   Old fractures like she has are not cemented, they have already healed.    Last visit, she was supposed to have a DEXA, I don't see that she has had this done. Results of this test will determine treatment options for improving her bone density and decreasing fracture risk.    We do not Rx chronic opioids. One time Norco was given because I thought she had a new fx. No further fills.     If she needs this type of medication, she needs to discuss PM referral with her PCP. I have seen her once in the office.

## 2020-04-07 ENCOUNTER — Ambulatory Visit
Admit: 2020-04-07 | Discharge: 2020-04-07 | Payer: MEDICARE | Attending: Cardiovascular Disease | Primary: Family Medicine

## 2020-04-07 ENCOUNTER — Ambulatory Visit: Attending: Cardiovascular Disease | Primary: Family Medicine

## 2020-04-07 DIAGNOSIS — Z9889 Other specified postprocedural states: Secondary | ICD-10-CM

## 2020-04-07 DIAGNOSIS — I208 Other forms of angina pectoris: Secondary | ICD-10-CM | POA: Diagnosis not present

## 2020-04-07 DIAGNOSIS — I10 Essential (primary) hypertension: Secondary | ICD-10-CM | POA: Diagnosis not present

## 2020-04-07 DIAGNOSIS — Z953 Presence of xenogenic heart valve: Secondary | ICD-10-CM | POA: Diagnosis not present

## 2020-04-07 DIAGNOSIS — F17200 Nicotine dependence, unspecified, uncomplicated: Secondary | ICD-10-CM | POA: Diagnosis not present

## 2020-04-07 LAB — AMB POC PT/INR
INR POC: 2.2
INR, POC: 2.2 NA

## 2020-04-07 NOTE — Progress Notes (Signed)
HISTORY OF PRESENT ILLNESS  Mary [name] is a 54 y.o. female.    New Patient  The history is provided by the patient. This is a chronic problem. The problem occurs daily. Associated symptoms include chest pain and shortness of breath. Pertinent negatives include no abdominal pain and no headaches.   Chest Pain (Angina)   The history is provided by the patient. This is a recurrent problem. The problem has not changed since onset.The problem occurs daily. The pain is present in the substernal region. The pain is at a severity of 3/10. The pain is mild. The quality of the pain is described as tightness. The pain does not radiate. Associated symptoms include diaphoresis and shortness of breath. Pertinent negatives include no abdominal pain, no claudication, no cough, no dizziness, no fever, no headaches, no hemoptysis, no nausea, no orthopnea, no palpitations, no PND, no sputum production, no vomiting and no weakness. Risk factors include smoking/tobacco exposure and hypertension.   Shortness of Breath  The history is provided by the patient. This is a chronic problem. The problem occurs frequently.The problem has not changed since onset.Associated symptoms include chest pain. Pertinent negatives include no fever, no headaches, no ear pain, no neck pain, no cough, no sputum production, no hemoptysis, no wheezing, no PND, no orthopnea, no vomiting, no abdominal pain, no rash, no leg swelling and no claudication. Associated medical issues include COPD.       Review of Systems   Constitutional: Positive for diaphoresis. Negative for chills, fever and weight loss.   HENT: Negative for ear pain and hearing loss.    Eyes: Negative for blurred vision.   Respiratory: Positive for shortness of breath. Negative for cough, hemoptysis, sputum production, wheezing and stridor.    Cardiovascular: Positive for chest pain. Negative for palpitations, orthopnea, claudication, leg swelling and PND.   Gastrointestinal: Negative for  abdominal pain, heartburn, nausea and vomiting.   Musculoskeletal: Negative for myalgias and neck pain.   Skin: Negative for rash.   Neurological: Negative for dizziness, tingling, tremors, focal weakness, loss of consciousness, weakness and headaches.   Psychiatric/Behavioral: Negative for depression and suicidal ideas.     Family History   Problem Relation Age of Onset   ??? No Known Problems Mother        Past Medical History:   Diagnosis Date   ??? Anxiety    ??? Asthma    ??? Bipolar 1 disorder (HCC)    ??? CHF (congestive heart failure) (HCC)    ??? COPD (chronic obstructive pulmonary disease) (HCC)    ??? H/O aortic valve replacement    ??? PTSD (post-traumatic stress disorder)        Past Surgical History:   Procedure Laterality Date   ??? HX HEART CATHETERIZATION     ??? HX HEART VALVE SURGERY         Social History     Tobacco Use   ??? Smoking status: Current Every Day Smoker     Packs/day: 1.00   ??? Smokeless tobacco: Never Used   Substance Use Topics   ??? Alcohol use: Never       Allergies   Allergen Reactions   ??? Adhesive Rash   ??? Lithium Nausea and Vomiting       Outpatient Medications Marked as Taking for the 04/07/20 encounter (Office Visit) with Lenox Ahr, MD   Medication Sig Dispense Refill   ??? traMADoL (Ultram) 50 mg tablet Take 1 Tablet by mouth every eight (8) hours as needed  for Pain for up to 3 days. Max Daily Amount: 150 mg. 9 Tablet 0   ??? albuterol (PROVENTIL HFA, VENTOLIN HFA, PROAIR HFA) 90 mcg/actuation inhaler Take 2 Puffs by inhalation every six (6) hours as needed.     ??? aspirin delayed-release 81 mg tablet Take 81 mg by mouth daily.     ??? cetirizine (ZYRTEC) 10 mg tablet Take 10 mg by mouth daily.     ??? fluticasone propionate (FLONASE) 50 mcg/actuation nasal spray 1 Spray by Nasal route daily.     ??? furosemide (LASIX) 40 mg tablet TAKE ONE TABLET BY MOUTH EVERY DAY     ??? ipratropium-albuteroL (Combivent Respimat) 20-100 mcg/actuation inhaler Take 1 Puff by inhalation.     ??? Combivent Respimat  20-100 mcg/actuation inhaler INHALE 1 PUFF BY MOUTH INTO LUNGS EVERY 6 HOURS     ??? lamoTRIgine (LaMICtal) 25 mg tablet TAKE 3 TABLETS BY MOUTH ONCE DAILY     ??? metoprolol succinate (TOPROL-XL) 25 mg XL tablet      ??? omeprazole (PRILOSEC) 40 mg capsule Take 40 mg by mouth daily.     ??? pravastatin (PRAVACHOL) 40 mg tablet Take 40 mg by mouth nightly.       ??? Psyllium Husk-Sucrose 3.4 gram/7 gram powd Take 1 Packet by mouth daily.     ??? warfarin (COUMADIN) 2.5 mg tablet TAKE 2 TABLETS BY MOUTH ONCE DAILY ON MONDAY WEDNESDAY FRIDAY AND TAKE 1 & 1 2 (ONE AND ONE HALF) ALL OTHER DAYS     ??? pregabalin (LYRICA) 200 mg capsule Take 1 Capsule by mouth three (3) times daily. Max Daily Amount: 600 mg. 90 Capsule 2        Visit Vitals  BP 122/63 (BP 1 Location: Left upper arm, BP Patient Position: Sitting, BP Cuff Size: Adult)   Pulse 74   Ht 5\' 3"  (1.6 m)   Wt 87.1 kg (192 lb)   SpO2 91%   BMI 34.01 kg/m??       Physical Exam  Constitutional:       Appearance: She is well-developed.   HENT:      Head: Normocephalic and atraumatic.   Eyes:      General: No scleral icterus.     Conjunctiva/sclera: Conjunctivae normal.   Neck:      Vascular: No JVD.   Cardiovascular:      Rate and Rhythm: Normal rate and regular rhythm.      Heart sounds: Normal heart sounds. No murmur heard.  No friction rub. No gallop.    Pulmonary:      Effort: Pulmonary effort is normal. No respiratory distress.      Breath sounds: Normal breath sounds. No stridor. No wheezing or rales.   Chest:      Chest wall: No tenderness.   Abdominal:      General: There is no distension.      Tenderness: There is no abdominal tenderness.   Musculoskeletal:         General: No tenderness. Normal range of motion.      Cervical back: Normal range of motion and neck supple.   Lymphadenopathy:      Cervical: No cervical adenopathy.   Skin:     Findings: No erythema or rash.   Neurological:      Mental Status: She is alert and oriented to person, place, and time.      Cranial  Nerves: No cranial nerve deficit.   Psychiatric:  Behavior: Behavior normal.     echo 2019:  Interpretation Summary   1. Left ventricle is normal in size and function with EF 60-65%. Normal LV   wall thickness.   2. Right ventricle is normal in size and function.   3. Status post bioprosthetic mitral valve replacement. Valve appears well   seated and functioning well. Mean gradient across the valve 4 mmHg.   4. Aortic valve appears trileaflet with mild sclerosis. No evidence of any   aortic stenosis. Mild aortic regurgitation.   5. Atria are normal in size.   6. Normal (-) agitated saline bubble study. No interatrial shunt. No   PFO/ASD.       ASSESSMENT and PLAN    ICD-10-CM ICD-9-CM    1. Stable angina pectoris (HCC)  I20.8 413.9 METABOLIC PANEL, COMPREHENSIVE      PROTHROMBIN TIME + INR      PTT      TSH 3RD GENERATION      CBC W/O DIFF      CASE REQUEST CATH LAB   2. Benign essential HTN  I10 401.1    3. Smoker  F17.200 305.1    4. S/P left atrial appendage ligation  Z98.890 V45.89    5. H/O maze procedure  Z98.890 V15.1    6. History of mitral valve replacement with bioprosthetic valve  Z95.3 V42.2 ECHO ADULT COMPLETE     Orders Placed This Encounter   ??? METABOLIC PANEL, COMPREHENSIVE     Standing Status:   Future     Standing Expiration Date:   04/08/2021   ??? PROTHROMBIN TIME + INR     Standing Status:   Future     Standing Expiration Date:   04/08/2021   ??? PTT     Standing Status:   Future     Standing Expiration Date:   04/08/2021   ??? TSH 3RD GENERATION     Standing Status:   Future     Standing Expiration Date:   04/08/2021   ??? CBC W/O DIFF     Standing Status:   Future     Standing Expiration Date:   04/08/2021   ??? ECHO ADULT COMPLETE     Standing Status:   Future     Standing Expiration Date:   10/08/2020     Order Specific Question:   Contrast Enhancement (Bubble Study, Definity, Optison) may be used if criteria listed in established evidence-based protocol has been identified.     Answer:   Yes      Follow-up and Dispositions    ?? Return in about 1 month (around 05/07/2020).       current treatment plan is effective, no change in therapy  very strongly urged to quit smoking to reduce cardiovascular risk  cardiovascular risk and specific lipid/LDL goals reviewed.    Patient is a 54 year old female with history of bioprosthetic mitral valve replacement/Maze procedure/LAA appendage ligation in 2017 seen for recurrent episodes of retrosternal chest discomfort and shortness of breath.  Continues to smoke 1 pack a day.  On Coumadin for history of proximal atrial fibrillation.  Chest pain-reports it is retrosternal occurring intermittently with shortness of breath.  Has mild diaphoresis.  CCS class III.  Shortness of breath progressively worsening.  No orthopnea or PND.  Recent EKG negative for acute ischemia.  Given multiple cardiac risk factors and chest pain concerning for intermittent coronary syndrome we will proceed with coronary angiogram to evaluate CAD.  Meanwhile patient advised to quit  smoking.  Will check INR-target INR is 2-2.5.

## 2020-04-07 NOTE — Addendum Note (Signed)
Addendum  Note by Marcelline Deist at 04/07/20 0900                Author: Marcelline Deist  Service: --  Author Type: Technician       Filed: 04/07/20 1014  Encounter Date: 04/07/2020  Status: Signed          Editor: Marcelline Deist (Technician)          Addended by: Marcelline Deist on: 04/07/2020 10:14 AM    Modules accepted: Orders

## 2020-04-07 NOTE — Progress Notes (Signed)
[name] is here today for anticoagulation monitoring for the diagnosis of Atrial Fibrillation.  Her INR goal is 2.0-3.0 and her current Coumadin dose is 2.5.     Today's findings include an INR of 2.2     Considering [name] past history, todays findings, and per the coumadin policy/protocol, [name] was instructed to take Coumadin as follows,  Keep current dose.  She was also instructed to come back in 2 weeks for an INR check.    A full discussion of the nature of anticoagulants has been carried out.  A full discussion of the need for frequent and regular monitoring, precise dosage adjustment and compliance was stressed.  Side effects of potential bleeding were discussed and [name] was instructed to call [phone] if there are any signs of abnormal bleeding.  [name] was instructed to avoid any OTC items containing aspirin or ibuprofen and prior to starting any new OTC products to consult with her physician or pharmacist to ensure no drug interactions are present.  [name] was instructed to avoid any major changes in her general diet and to avoid alcohol consumption.  .      [name] verbalized her understanding of all instructions and will call the office with any questions, concerns, or signs of abnormal bleeding or blood clot.

## 2020-04-08 DIAGNOSIS — F419 Anxiety disorder, unspecified: Secondary | ICD-10-CM | POA: Diagnosis not present

## 2020-04-08 DIAGNOSIS — M5134 Other intervertebral disc degeneration, thoracic region: Secondary | ICD-10-CM | POA: Diagnosis not present

## 2020-04-08 DIAGNOSIS — F3175 Bipolar disorder, in partial remission, most recent episode depressed: Secondary | ICD-10-CM | POA: Diagnosis not present

## 2020-04-08 DIAGNOSIS — S32000A Wedge compression fracture of unspecified lumbar vertebra, initial encounter for closed fracture: Secondary | ICD-10-CM | POA: Diagnosis not present

## 2020-04-11 IMAGING — MG MM DIGITAL SCREENING BILAT W/ TOMO W/ CAD
8 series · 8 of 24 positions shown · non-contrast
Comparison: Previous exam(s).

ACR Breast Density Category a: The breast tissue is almost entirely
fatty.

CLINICAL DATA: Screening.

EXAM:
DIGITAL SCREENING BILATERAL MAMMOGRAM WITH TOMO AND CAD

[L MLO synth-2D]
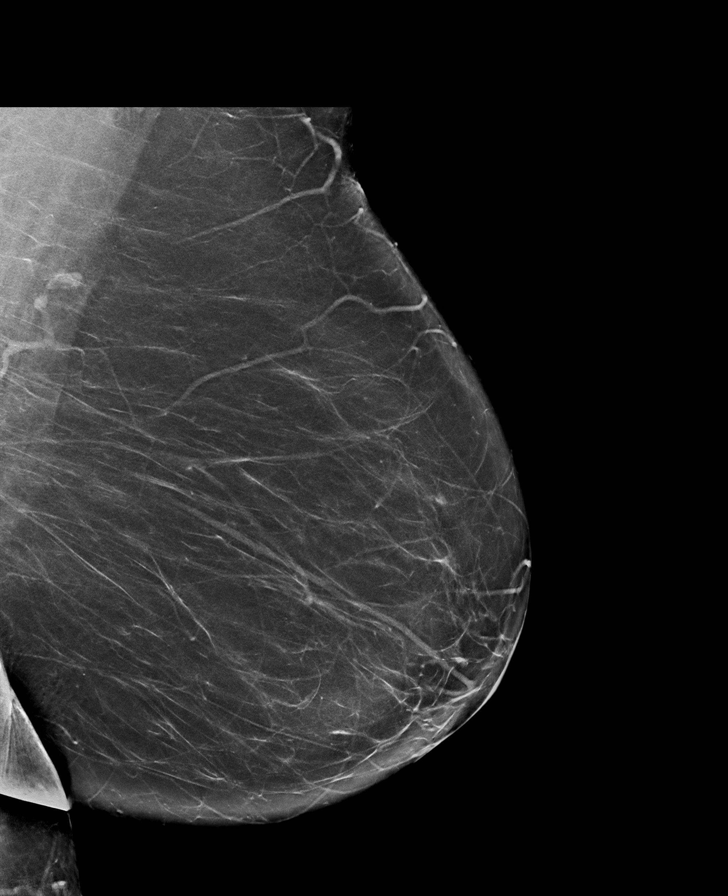

[L CC synth-2D]
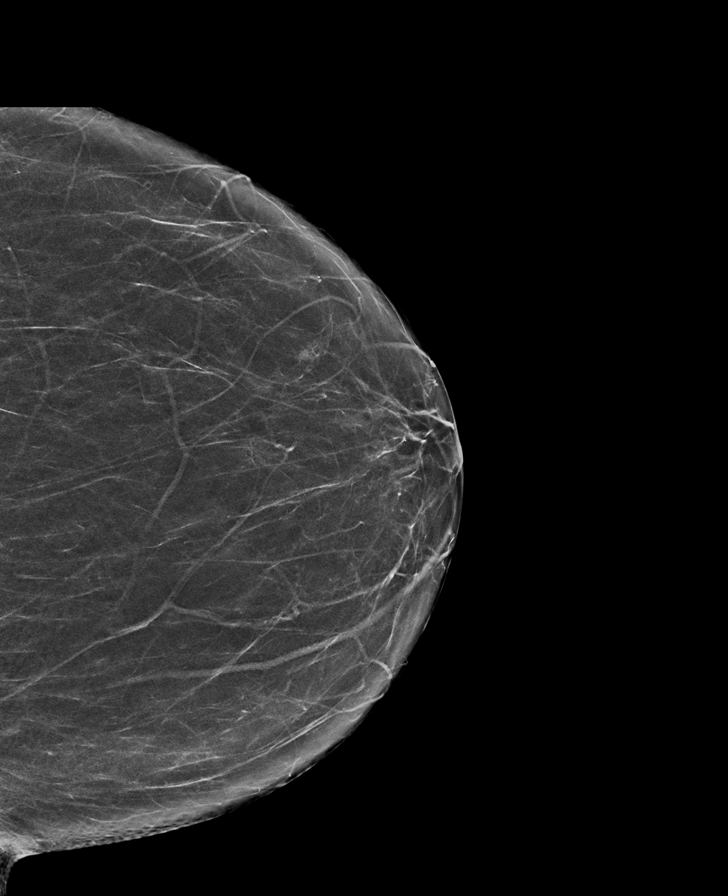

[R CC synth-2D]
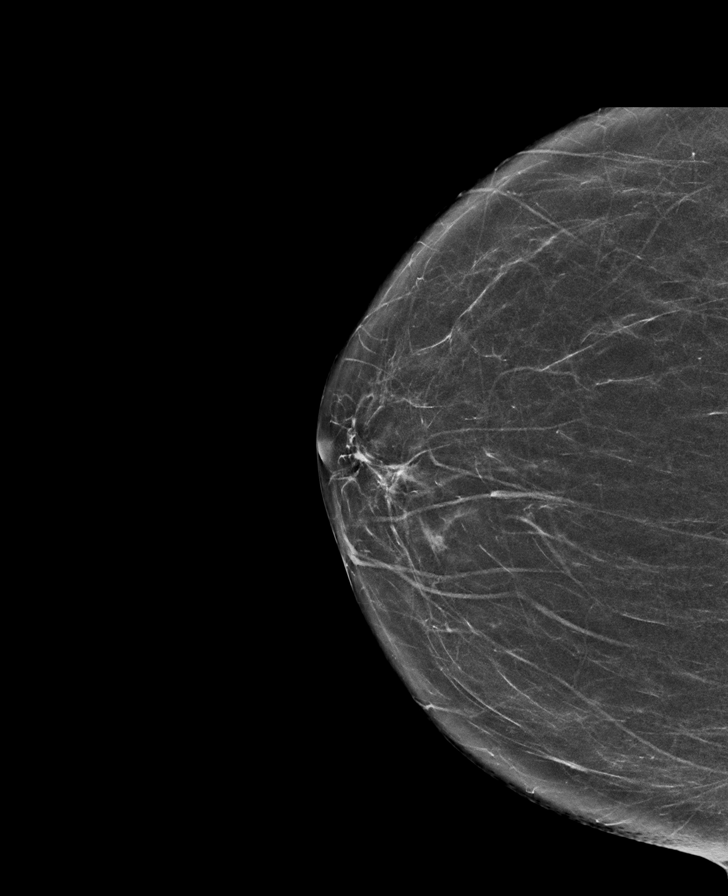

[R MLO synth-2D]
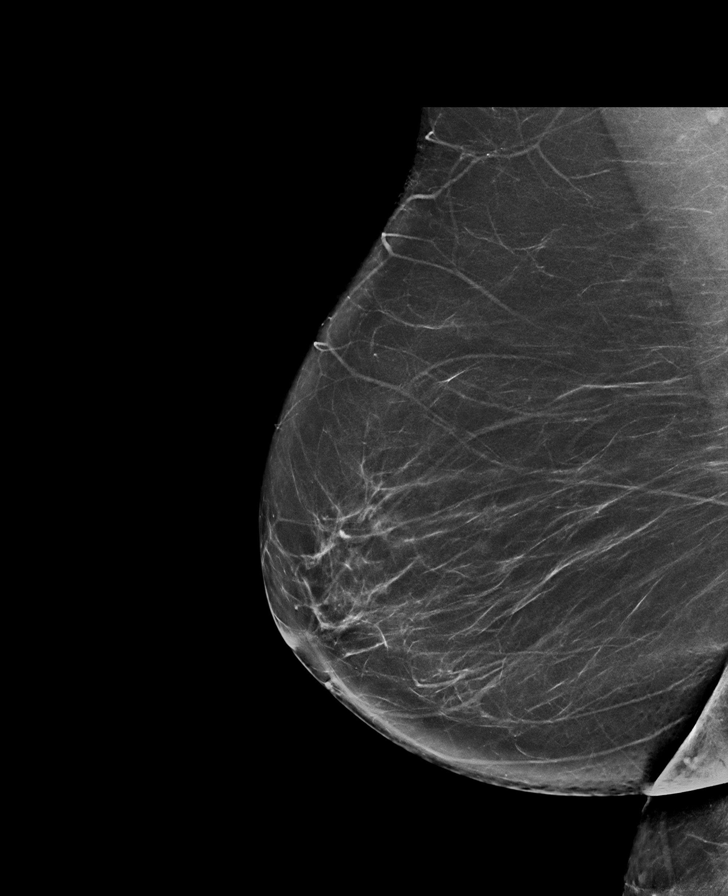

[L MLO tomo · tomo slice 39/77.0]
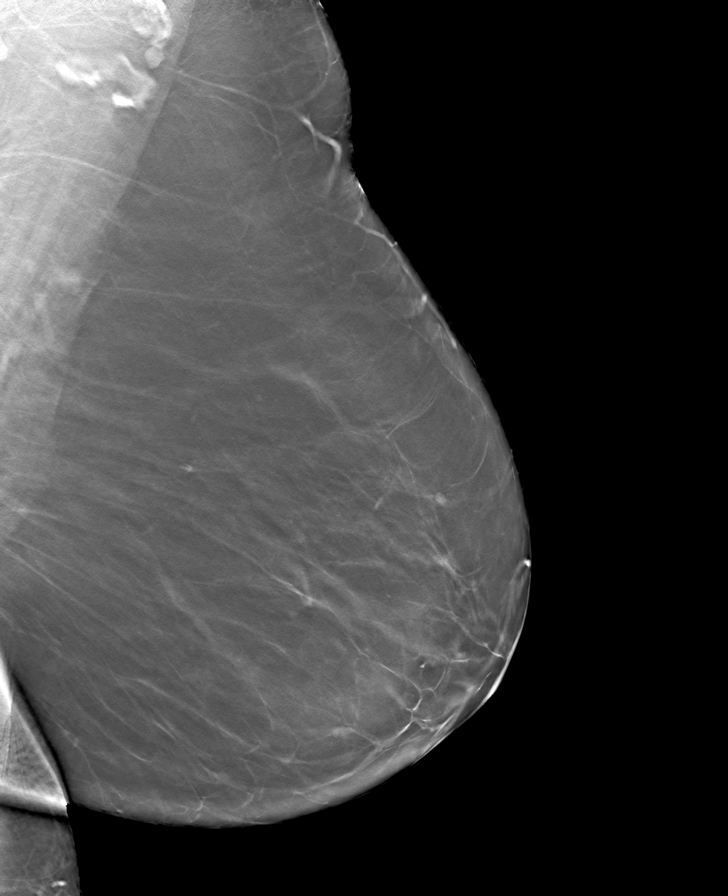

[R MLO tomo · tomo slice 38/75.0]
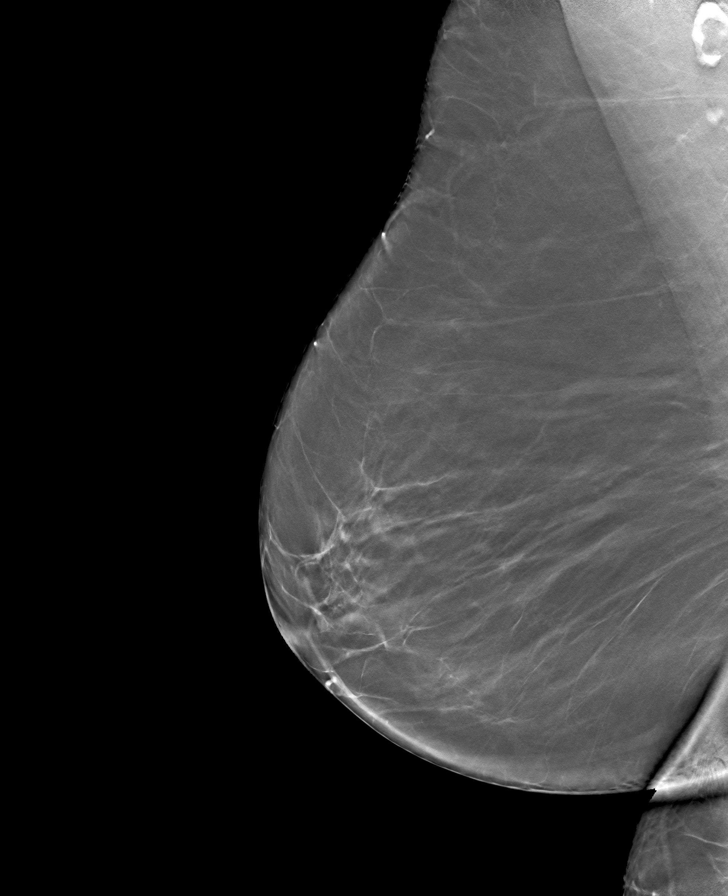

[L CC tomo · tomo slice 32/63.0]
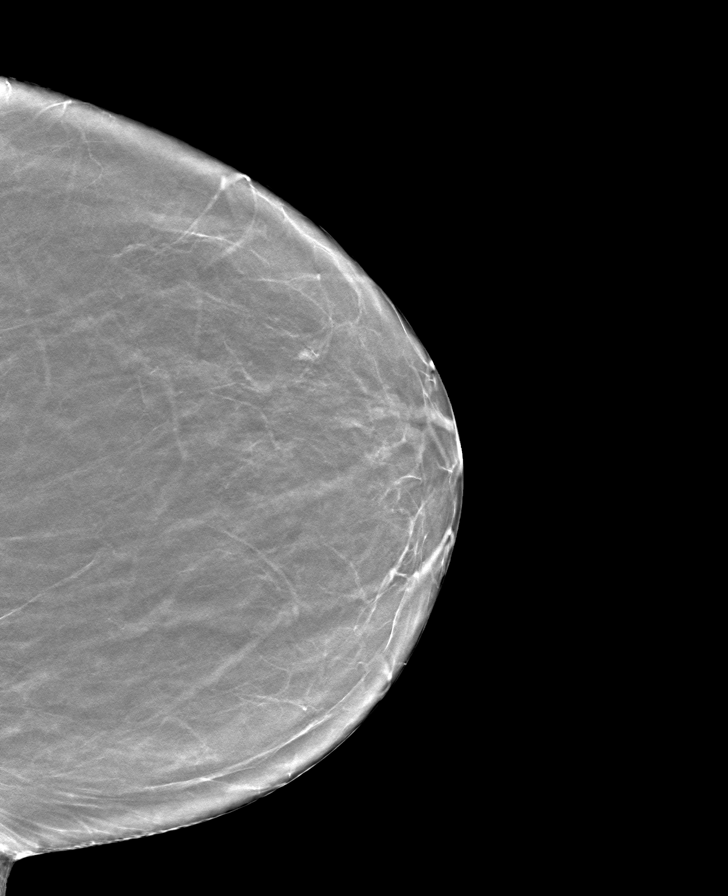

[R CC tomo · tomo slice 31/62.0]
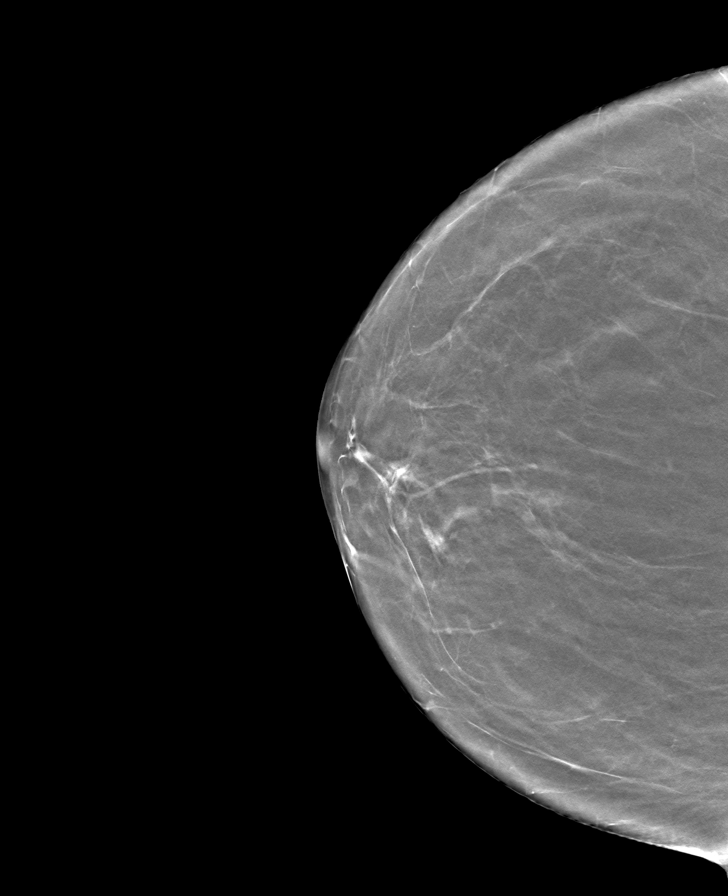

[8 of 24 positions shown; findings below may reference images not displayed]

FINDINGS: There are no findings suspicious for malignancy. Images were
processed with CAD.
IMPRESSION: No mammographic evidence of malignancy. A result letter of this
screening mammogram will be mailed directly to the patient.

RECOMMENDATION:
Screening mammogram in one year. (Code:[id number])

BI-RADS CATEGORY  1: Negative.

## 2020-04-22 ENCOUNTER — Inpatient Hospital Stay: Admit: 2020-04-22 | Payer: MEDICARE | Primary: Family Medicine

## 2020-04-22 DIAGNOSIS — I208 Other forms of angina pectoris: Secondary | ICD-10-CM

## 2020-04-22 LAB — METABOLIC PANEL, COMPREHENSIVE
A-G Ratio: 1.2 (ref 0.8–1.7)
ALT (SGPT): 20 U/L (ref 13–56)
AST (SGOT): 17 U/L (ref 10–38)
Albumin: 3.8 g/dL (ref 3.4–5.0)
Alk. phosphatase: 55 U/L (ref 45–117)
Anion gap: 5 mmol/L (ref 3.0–18)
BUN/Creatinine ratio: 15 (ref 12–20)
BUN: 15 MG/DL (ref 7.0–18)
Bilirubin, total: 0.6 MG/DL (ref 0.2–1.0)
CO2: 30 mmol/L (ref 21–32)
Calcium: 9.3 MG/DL (ref 8.5–10.1)
Chloride: 107 mmol/L (ref 100–111)
Creatinine: 0.98 MG/DL (ref 0.6–1.3)
GFR est AA: >60 mL/min/{1.73_m2} (ref >60)
GFR est non-AA: 59 mL/min/{1.73_m2} — ABNORMAL LOW (ref >60)
Globulin: 3.2 g/dL (ref 2.0–4.0)
Glucose: 82 mg/dL (ref 74–99)
Potassium: 4.5 mmol/L (ref 3.5–5.5)
Protein, total: 7 g/dL (ref 6.4–8.2)
Sodium: 142 mmol/L (ref 136–145)

## 2020-04-22 LAB — CBC W/O DIFF
ABSOLUTE NRBC: 0 10*3/uL (ref 0.00–0.01)
HCT: 44.3 % (ref 35.0–45.0)
HGB: 14.1 g/dL (ref 12.0–16.0)
MCH: 29.3 PG (ref 24.0–34.0)
MCHC: 31.8 g/dL (ref 31.0–37.0)
MCV: 92.1 FL (ref 78.0–100.0)
MPV: 9.3 FL (ref 9.2–11.8)
NRBC: 0 PER 100 WBC
PLATELET: 241 10*3/uL (ref 135–420)
RBC: 4.81 M/uL (ref 4.20–5.30)
RDW: 13 % (ref 11.6–14.5)
WBC: 6.1 10*3/uL (ref 4.6–13.2)

## 2020-04-22 LAB — PTT: aPTT: 36.4 s (ref 23.0–36.4)

## 2020-04-22 LAB — PROTHROMBIN TIME + INR
INR: 1.4 — ABNORMAL HIGH (ref 0.8–1.2)
Prothrombin time: 17.2 s — ABNORMAL HIGH (ref 11.5–15.2)

## 2020-04-22 LAB — TSH 3RD GENERATION
TSH: 1.88 u[IU]/mL (ref 0.36–3.74)
TSH: 1.88 u[IU]/mL (ref 0.36–3.74)

## 2020-04-22 LAB — COMPREHENSIVE METABOLIC PANEL
ALT: 20 U/L (ref 13–56)
AST: 17 U/L (ref 10–38)
Albumin/Globulin Ratio: 1.2 (ref 0.8–1.7)
Albumin: 3.8 g/dL (ref 3.4–5.0)
Alkaline Phosphatase: 55 U/L (ref 45–117)
Anion Gap: 5 mmol/L (ref 3.0–18)
BUN: 15 MG/DL (ref 7.0–18)
Bun/Cre Ratio: 15 (ref 12–20)
CO2: 30 mmol/L (ref 21–32)
Calcium: 9.3 MG/DL (ref 8.5–10.1)
Chloride: 107 mmol/L (ref 100–111)
Creatinine: 0.98 MG/DL (ref 0.6–1.3)
EGFR IF NonAfrican American: 59 mL/min/{1.73_m2} — ABNORMAL LOW (ref >60)
GFR African American: >60 mL/min/{1.73_m2} (ref >60)
Globulin: 3.2 g/dL (ref 2.0–4.0)
Glucose: 82 mg/dL (ref 74–99)
Potassium: 4.5 mmol/L (ref 3.5–5.5)
Sodium: 142 mmol/L (ref 136–145)
Total Bilirubin: 0.6 MG/DL (ref 0.2–1.0)
Total Protein: 7 g/dL (ref 6.4–8.2)

## 2020-04-22 LAB — PROTIME-INR
INR: 1.4 — ABNORMAL HIGH (ref 0.8–1.2)
Protime: 17.2 s — ABNORMAL HIGH (ref 11.5–15.2)

## 2020-04-22 LAB — APTT: aPTT: 36.4 s (ref 23.0–36.4)

## 2020-04-22 LAB — CBC
Hematocrit: 44.3 % (ref 35.0–45.0)
Hemoglobin: 14.1 g/dL (ref 12.0–16.0)
MCH: 29.3 PG (ref 24.0–34.0)
MCHC: 31.8 g/dL (ref 31.0–37.0)
MCV: 92.1 FL (ref 78.0–100.0)
MPV: 9.3 FL (ref 9.2–11.8)
NRBC Absolute: 0 10*3/uL (ref 0.00–0.01)
Nucleated RBCs: 0 PER 100 WBC
Platelets: 241 10*3/uL (ref 135–420)
RBC: 4.81 M/uL (ref 4.20–5.30)
RDW: 13 % (ref 11.6–14.5)
WBC: 6.1 10*3/uL (ref 4.6–13.2)

## 2020-04-22 NOTE — Telephone Encounter (Signed)
On warfarin for bioprosthetic valve and is scheduled for Cardiac cath on 04/27/20. Please advised if needs bridged to lovenox  and when to stop warfarin.

## 2020-04-23 ENCOUNTER — Telehealth

## 2020-04-23 ENCOUNTER — Ambulatory Visit: Payer: MEDICARE | Primary: Family Medicine

## 2020-04-23 DIAGNOSIS — I480 Paroxysmal atrial fibrillation: Secondary | ICD-10-CM | POA: Diagnosis not present

## 2020-04-23 DIAGNOSIS — Z7901 Long term (current) use of anticoagulants: Secondary | ICD-10-CM | POA: Diagnosis not present

## 2020-04-23 MED ORDER — ENOXAPARIN 60 MG/0.6 ML SUB-Q SYRINGE
60 mg/0.6 mL | Freq: Two times a day (BID) | SUBCUTANEOUS | 0 refills | Status: DC
Start: 2020-04-23 — End: 2020-05-03

## 2020-04-23 NOTE — Progress Notes (Signed)
Called and left message with patient regarding Lovenox injection per Dr. Norva Karvonen. Take 60 ml sub every 12 hours on 04/24/20- 04/26/20 and have cardiac cath on Monday. Prescription was sent to your [facility] pharmacy. If any questions to call office.

## 2020-04-24 NOTE — Telephone Encounter (Signed)
Sent to [place]  LPN  to complete

## 2020-04-27 ENCOUNTER — Inpatient Hospital Stay: Payer: MEDICARE

## 2020-04-27 DIAGNOSIS — I509 Heart failure, unspecified: Secondary | ICD-10-CM | POA: Diagnosis not present

## 2020-04-27 DIAGNOSIS — I208 Other forms of angina pectoris: Secondary | ICD-10-CM | POA: Diagnosis not present

## 2020-04-27 DIAGNOSIS — J449 Chronic obstructive pulmonary disease, unspecified: Secondary | ICD-10-CM | POA: Diagnosis not present

## 2020-04-27 DIAGNOSIS — F17210 Nicotine dependence, cigarettes, uncomplicated: Secondary | ICD-10-CM | POA: Diagnosis not present

## 2020-04-27 DIAGNOSIS — Z952 Presence of prosthetic heart valve: Secondary | ICD-10-CM | POA: Diagnosis not present

## 2020-04-27 DIAGNOSIS — Z7901 Long term (current) use of anticoagulants: Secondary | ICD-10-CM | POA: Diagnosis not present

## 2020-04-27 DIAGNOSIS — I110 Hypertensive heart disease with heart failure: Secondary | ICD-10-CM | POA: Diagnosis not present

## 2020-04-27 DIAGNOSIS — I2510 Atherosclerotic heart disease of native coronary artery without angina pectoris: Secondary | ICD-10-CM | POA: Diagnosis not present

## 2020-04-27 DIAGNOSIS — Z79899 Other long term (current) drug therapy: Secondary | ICD-10-CM | POA: Diagnosis not present

## 2020-04-27 DIAGNOSIS — Z7982 Long term (current) use of aspirin: Secondary | ICD-10-CM | POA: Diagnosis not present

## 2020-04-27 LAB — POC INR (AMB): INR (POC): 1.1 (ref <1.2)

## 2020-04-27 MED ORDER — HEPARIN (PORCINE) IN NS (PF) 1,000 UNIT/500 ML IV
1000 unit/500 mL | INTRAVENOUS | Status: AC | PRN
Start: 2020-04-27 — End: 2020-04-27
  Administered 2020-04-27 (×2)

## 2020-04-27 MED ORDER — FENTANYL CITRATE (PF) 50 MCG/ML IJ SOLN
50 mcg/mL | INTRAMUSCULAR | Status: DC | PRN
Start: 2020-04-27 — End: 2020-04-27
  Administered 2020-04-27 (×2): via INTRAVENOUS

## 2020-04-27 MED ORDER — LIDOCAINE (PF) 10 MG/ML (1 %) IJ SOLN
10 mg/mL (1 %) | INTRAMUSCULAR | Status: DC | PRN
Start: 2020-04-27 — End: 2020-04-27
  Administered 2020-04-27 (×2): via SUBCUTANEOUS

## 2020-04-27 MED ORDER — MIDAZOLAM 1 MG/ML IJ SOLN
1 mg/mL | INTRAMUSCULAR | Status: DC | PRN
Start: 2020-04-27 — End: 2020-04-27
  Administered 2020-04-27 (×2): via INTRAVENOUS

## 2020-04-27 MED ORDER — IOPAMIDOL 61 % IV SOLN
61 % | INTRAVENOUS | Status: DC | PRN
Start: 2020-04-27 — End: 2020-04-27
  Administered 2020-04-27: 16:00:00

## 2020-04-27 MED ORDER — VERAPAMIL 2.5 MG/ML IV
2.5 mg/mL | INTRAVENOUS | Status: AC
Start: 2020-04-27 — End: ?

## 2020-04-27 MED ORDER — HEPARIN (PORCINE) 1,000 UNIT/ML IJ SOLN
1000 unit/mL | INTRAMUSCULAR | Status: AC
Start: 2020-04-27 — End: ?

## 2020-04-27 MED ORDER — NITROGLYCERIN 2 MG/5 ML (400 MCG/ML) COMPOUNDED INJ HR
400 mcg/mL | Status: AC
Start: 2020-04-27 — End: ?

## 2020-04-27 MED ORDER — SODIUM CHLORIDE 0.9 % IV
INTRAVENOUS | Status: AC | PRN
Start: 2020-04-27 — End: 2020-04-27
  Administered 2020-04-27: 16:00:00 via INTRAVENOUS

## 2020-04-27 MED ORDER — ASPIRIN 81 MG CHEWABLE TAB
81 mg | ORAL | Status: DC
Start: 2020-04-27 — End: 2020-04-27

## 2020-04-27 MED ORDER — FENTANYL CITRATE (PF) 50 MCG/ML IJ SOLN
50 mcg/mL | INTRAMUSCULAR | Status: AC
Start: 2020-04-27 — End: ?

## 2020-04-27 MED ORDER — LIDOCAINE (PF) 10 MG/ML (1 %) IJ SOLN
10 mg/mL (1 %) | INTRAMUSCULAR | Status: AC
Start: 2020-04-27 — End: ?

## 2020-04-27 MED ORDER — SODIUM CHLORIDE 0.9 % IJ SYRG
Freq: Three times a day (TID) | INTRAMUSCULAR | Status: DC
Start: 2020-04-27 — End: 2020-04-27

## 2020-04-27 MED ORDER — ASPIRIN 81 MG CHEWABLE TAB
81 mg | Freq: Once | ORAL | Status: AC
Start: 2020-04-27 — End: 2020-04-27
  Administered 2020-04-27: 15:00:00 via ORAL

## 2020-04-27 MED ORDER — VERAPAMIL 2.5 MG/ML IV
2.5 mg/mL | INTRAVENOUS | Status: DC | PRN
Start: 2020-04-27 — End: 2020-04-27
  Administered 2020-04-27: 15:00:00 via INTRA_ARTERIAL

## 2020-04-27 MED ORDER — NITROGLYCERIN 2 MG/5 ML (400 MCG/ML) COMPOUNDED INJ HR
400 mcg/mL | Status: DC | PRN
Start: 2020-04-27 — End: 2020-04-27
  Administered 2020-04-27: 15:00:00 via INTRA_ARTERIAL

## 2020-04-27 MED ORDER — HEPARIN (PORCINE) IN NS (PF) 1,000 UNIT/500 ML IV
1000 unit/500 mL | INTRAVENOUS | Status: AC
Start: 2020-04-27 — End: ?

## 2020-04-27 MED ORDER — SODIUM CHLORIDE 0.9 % IJ SYRG
INTRAMUSCULAR | Status: DC | PRN
Start: 2020-04-27 — End: 2020-04-27

## 2020-04-27 MED ORDER — MIDAZOLAM 1 MG/ML IJ SOLN
1 mg/mL | INTRAMUSCULAR | Status: AC
Start: 2020-04-27 — End: ?

## 2020-04-27 MED FILL — MIDAZOLAM 1 MG/ML IJ SOLN: 1 mg/mL | INTRAMUSCULAR | Qty: 2

## 2020-04-27 MED FILL — HEPARIN (PORCINE) IN NS (PF) 1,000 UNIT/500 ML IV: 1000 unit/500 mL | INTRAVENOUS | Qty: 1000

## 2020-04-27 MED FILL — NITROGLYCERIN 2 MG/5 ML (400 MCG/ML) COMPOUNDED INJ HR: 400 mcg/mL | Qty: 5

## 2020-04-27 MED FILL — XYLOCAINE-MPF 10 MG/ML (1 %) INJECTION SOLUTION: 10 mg/mL (1 %) | INTRAMUSCULAR | Qty: 30

## 2020-04-27 MED FILL — FENTANYL CITRATE (PF) 50 MCG/ML IJ SOLN: 50 mcg/mL | INTRAMUSCULAR | Qty: 2

## 2020-04-27 MED FILL — BD POSIFLUSH NORMAL SALINE 0.9 % INJECTION SYRINGE: INTRAMUSCULAR | Qty: 40

## 2020-04-27 MED FILL — HEPARIN (PORCINE) 1,000 UNIT/ML IJ SOLN: 1000 unit/mL | INTRAMUSCULAR | Qty: 10

## 2020-04-27 MED FILL — VERAPAMIL 2.5 MG/ML IV: 2.5 mg/mL | INTRAVENOUS | Qty: 2

## 2020-04-27 MED FILL — ASPIRIN 81 MG CHEWABLE TAB: 81 mg | ORAL | Qty: 1

## 2020-04-27 NOTE — H&P (Signed)
H&P update    No new symptoms  Risks, benefits and alternatives of LHC/ptca/stent explained to patient.  All questions answered

## 2020-04-29 ENCOUNTER — Encounter

## 2020-04-29 DIAGNOSIS — Z7901 Long term (current) use of anticoagulants: Secondary | ICD-10-CM | POA: Diagnosis not present

## 2020-04-29 DIAGNOSIS — G8929 Other chronic pain: Secondary | ICD-10-CM | POA: Diagnosis not present

## 2020-04-29 DIAGNOSIS — M549 Dorsalgia, unspecified: Secondary | ICD-10-CM | POA: Diagnosis not present

## 2020-04-29 DIAGNOSIS — Z5181 Encounter for therapeutic drug level monitoring: Secondary | ICD-10-CM | POA: Diagnosis not present

## 2020-04-29 MED ORDER — PREGABALIN 200 MG CAP
200 mg | ORAL_CAPSULE | ORAL | 1 refills | Status: DC
Start: 2020-04-29 — End: 2020-06-03

## 2020-04-29 NOTE — Telephone Encounter (Signed)
Patient called as well.  She has her bone scan tomorrow with a follow up on the schedule for 06/03/20.      Patient

## 2020-04-30 ENCOUNTER — Ambulatory Visit: Payer: MEDICARE | Primary: Family Medicine

## 2020-05-03 ENCOUNTER — Inpatient Hospital Stay
Admit: 2020-05-03 | Discharge: 2020-05-15 | Disposition: A | Discharge status: Home / Self Care | Payer: MEDICARE | Attending: Family Medicine | Admitting: Family Medicine

## 2020-05-03 ENCOUNTER — Emergency Department: Admit: 2020-05-03 | Payer: MEDICARE | Primary: Family Medicine

## 2020-05-03 DIAGNOSIS — U071 COVID-19: Principal | ICD-10-CM

## 2020-05-03 DIAGNOSIS — R6520 Severe sepsis without septic shock: Secondary | ICD-10-CM | POA: Diagnosis not present

## 2020-05-03 DIAGNOSIS — J80 Acute respiratory distress syndrome: Secondary | ICD-10-CM | POA: Diagnosis present

## 2020-05-03 DIAGNOSIS — R918 Other nonspecific abnormal finding of lung field: Secondary | ICD-10-CM | POA: Diagnosis not present

## 2020-05-03 DIAGNOSIS — R079 Chest pain, unspecified: Secondary | ICD-10-CM | POA: Diagnosis not present

## 2020-05-03 DIAGNOSIS — J811 Chronic pulmonary edema: Secondary | ICD-10-CM | POA: Diagnosis not present

## 2020-05-03 DIAGNOSIS — I959 Hypotension, unspecified: Secondary | ICD-10-CM | POA: Diagnosis present

## 2020-05-03 DIAGNOSIS — I4891 Unspecified atrial fibrillation: Secondary | ICD-10-CM | POA: Diagnosis not present

## 2020-05-03 DIAGNOSIS — R509 Fever, unspecified: Secondary | ICD-10-CM | POA: Diagnosis not present

## 2020-05-03 DIAGNOSIS — J449 Chronic obstructive pulmonary disease, unspecified: Secondary | ICD-10-CM | POA: Diagnosis not present

## 2020-05-03 DIAGNOSIS — J9601 Acute respiratory failure with hypoxia: Secondary | ICD-10-CM | POA: Diagnosis not present

## 2020-05-03 DIAGNOSIS — Z79899 Other long term (current) drug therapy: Secondary | ICD-10-CM | POA: Diagnosis not present

## 2020-05-03 DIAGNOSIS — Z7901 Long term (current) use of anticoagulants: Secondary | ICD-10-CM | POA: Diagnosis not present

## 2020-05-03 DIAGNOSIS — F4310 Post-traumatic stress disorder, unspecified: Secondary | ICD-10-CM | POA: Diagnosis present

## 2020-05-03 DIAGNOSIS — Z9861 Coronary angioplasty status: Secondary | ICD-10-CM | POA: Diagnosis not present

## 2020-05-03 DIAGNOSIS — A419 Sepsis, unspecified organism: Secondary | ICD-10-CM | POA: Diagnosis not present

## 2020-05-03 DIAGNOSIS — I509 Heart failure, unspecified: Secondary | ICD-10-CM | POA: Diagnosis present

## 2020-05-03 DIAGNOSIS — N179 Acute kidney failure, unspecified: Secondary | ICD-10-CM | POA: Diagnosis not present

## 2020-05-03 DIAGNOSIS — R069 Unspecified abnormalities of breathing: Secondary | ICD-10-CM | POA: Diagnosis not present

## 2020-05-03 DIAGNOSIS — B9729 Other coronavirus as the cause of diseases classified elsewhere: Secondary | ICD-10-CM | POA: Diagnosis not present

## 2020-05-03 DIAGNOSIS — J1282 Pneumonia due to coronavirus disease 2019: Secondary | ICD-10-CM | POA: Diagnosis present

## 2020-05-03 DIAGNOSIS — J9690 Respiratory failure, unspecified, unspecified whether with hypoxia or hypercapnia: Secondary | ICD-10-CM | POA: Diagnosis not present

## 2020-05-03 DIAGNOSIS — Z6834 Body mass index (BMI) 34.0-34.9, adult: Secondary | ICD-10-CM | POA: Diagnosis not present

## 2020-05-03 DIAGNOSIS — J189 Pneumonia, unspecified organism: Secondary | ICD-10-CM | POA: Diagnosis not present

## 2020-05-03 DIAGNOSIS — J439 Emphysema, unspecified: Secondary | ICD-10-CM | POA: Diagnosis present

## 2020-05-03 DIAGNOSIS — J1289 Other viral pneumonia: Secondary | ICD-10-CM | POA: Diagnosis not present

## 2020-05-03 DIAGNOSIS — F319 Bipolar disorder, unspecified: Secondary | ICD-10-CM | POA: Diagnosis not present

## 2020-05-03 DIAGNOSIS — R55 Syncope and collapse: Secondary | ICD-10-CM | POA: Diagnosis not present

## 2020-05-03 DIAGNOSIS — I110 Hypertensive heart disease with heart failure: Secondary | ICD-10-CM | POA: Diagnosis present

## 2020-05-03 DIAGNOSIS — R001 Bradycardia, unspecified: Secondary | ICD-10-CM | POA: Diagnosis not present

## 2020-05-03 DIAGNOSIS — Z952 Presence of prosthetic heart valve: Secondary | ICD-10-CM | POA: Diagnosis not present

## 2020-05-03 DIAGNOSIS — J90 Pleural effusion, not elsewhere classified: Secondary | ICD-10-CM | POA: Diagnosis not present

## 2020-05-03 DIAGNOSIS — D849 Immunodeficiency, unspecified: Secondary | ICD-10-CM | POA: Diagnosis not present

## 2020-05-03 DIAGNOSIS — I480 Paroxysmal atrial fibrillation: Secondary | ICD-10-CM | POA: Diagnosis present

## 2020-05-03 DIAGNOSIS — F419 Anxiety disorder, unspecified: Secondary | ICD-10-CM | POA: Diagnosis present

## 2020-05-03 DIAGNOSIS — R0602 Shortness of breath: Secondary | ICD-10-CM | POA: Diagnosis not present

## 2020-05-03 DIAGNOSIS — F17210 Nicotine dependence, cigarettes, uncomplicated: Secondary | ICD-10-CM | POA: Diagnosis not present

## 2020-05-03 LAB — METABOLIC PANEL, COMPREHENSIVE
A-G Ratio: 1.4 (ref 0.8–1.7)
ALT (SGPT): 32 U/L (ref 13–56)
AST (SGOT): 30 U/L (ref 10–38)
Albumin: 4.2 g/dL (ref 3.4–5.0)
Alk. phosphatase: 55 U/L (ref 45–117)
Anion gap: 10 mmol/L (ref 3.0–18)
BUN/Creatinine ratio: 8 — ABNORMAL LOW (ref 12–20)
BUN: 8 MG/DL (ref 7.0–18)
Bilirubin, total: 0.6 MG/DL (ref 0.2–1.0)
CO2: 27 mmol/L (ref 21–32)
Calcium: 9.2 MG/DL (ref 8.5–10.1)
Chloride: 103 mmol/L (ref 100–111)
Creatinine: 0.99 MG/DL (ref 0.6–1.3)
GFR est AA: >60 mL/min/{1.73_m2} (ref >60)
GFR est non-AA: 59 mL/min/{1.73_m2} — ABNORMAL LOW (ref >60)
Globulin: 3.1 g/dL (ref 2.0–4.0)
Glucose: 98 mg/dL (ref 74–99)
Potassium: 3.6 mmol/L (ref 3.5–5.5)
Protein, total: 7.3 g/dL (ref 6.4–8.2)
Sodium: 140 mmol/L (ref 136–145)

## 2020-05-03 LAB — BLOOD GAS, ARTERIAL POC
Allens test (POC): POSITIVE
Base Excess, Arterial: 0.7 mmol/L
Base excess (POC): 0.7 mmol/L
FIO2 (POC): 2 %
FIO2: 2 %
HCO3 (POC): 25.4 MMOL/L (ref 22–26)
HCO3, Art: 25.4 MMOL/L (ref 22–26)
POC Allen's Test: POSITIVE
POC O2 SAT: 93 % (ref 92–97)
Respiratory Rate: 22
Total resp. rate: 22
pCO2 (POC): 40.3 MMHG (ref 35.0–45.0)
pCO2, Art: 40.3 MMHG (ref 35.0–45.0)
pH (POC): 7.41 (ref 7.35–7.45)
pH, Art: 7.41 (ref 7.35–7.45)
pO2 (POC): 67 MMHG — ABNORMAL LOW (ref 80–100)
pO2, Art: 67 MMHG — ABNORMAL LOW (ref 80–100)
sO2 (POC): 93 % (ref 92–97)

## 2020-05-03 LAB — POC LACTIC ACID: Lactic Acid (POC): 0.65 mmol/L (ref 0.40–2.00)

## 2020-05-03 LAB — CBC WITH AUTOMATED DIFF
ABS. BASOPHILS: 0 10*3/uL (ref 0.0–0.1)
ABS. EOSINOPHILS: 0 10*3/uL (ref 0.0–0.4)
ABS. IMM. GRANS.: 0 10*3/uL (ref 0.00–0.04)
ABS. LYMPHOCYTES: 0.4 10*3/uL — ABNORMAL LOW (ref 0.9–3.6)
ABS. MONOCYTES: 0.4 10*3/uL (ref 0.05–1.2)
ABS. NEUTROPHILS: 1.8 10*3/uL (ref 1.8–8.0)
ABSOLUTE NRBC: 0 10*3/uL (ref 0.00–0.01)
BASOPHILS: 1 % (ref 0–2)
EOSINOPHILS: 0 % (ref 0–5)
HCT: 43.1 % (ref 35.0–45.0)
HGB: 13.9 g/dL (ref 12.0–16.0)
IMMATURE GRANULOCYTES: 0 % (ref 0.0–0.5)
LYMPHOCYTES: 16 % — ABNORMAL LOW (ref 21–52)
MCH: 29.3 PG (ref 24.0–34.0)
MCHC: 32.3 g/dL (ref 31.0–37.0)
MCV: 90.7 FL (ref 78.0–100.0)
MONOCYTES: 15 % — ABNORMAL HIGH (ref 3–10)
MPV: 10.1 FL (ref 9.2–11.8)
NEUTROPHILS: 67 % (ref 40–73)
NRBC: 0 PER 100 WBC
PLATELET: 200 10*3/uL (ref 135–420)
RBC: 4.75 M/uL (ref 4.20–5.30)
RDW: 13.2 % (ref 11.6–14.5)
WBC: 2.7 10*3/uL — ABNORMAL LOW (ref 4.6–13.2)

## 2020-05-03 LAB — D DIMER: D DIMER: 0.34 ug/ml(FEU) (ref <0.46)

## 2020-05-03 LAB — EKG, 12 LEAD, INITIAL
Atrial Rate: 88 {beats}/min
Calculated P Axis: 95 degrees
Calculated R Axis: 45 degrees
Calculated T Axis: 145 degrees
Diagnosis: NORMAL
P-R Interval: 140 ms
Q-T Interval: 354 ms
QRS Duration: 90 ms
QTC Calculation (Bezet): 428 ms
Ventricular Rate: 88 {beats}/min

## 2020-05-03 LAB — RESPIRATORY VIRUS PANEL W/COVID-19, PCR
Adenovirus: NOT DETECTED
Adenovirus: NOT DETECTED
B. parapertussis, PCR: NOT DETECTED
BORDETELLA PARAPERTUSSIS PCR, BORPAR: NOT DETECTED
Bordetella pertussis - PCR: NOT DETECTED
Bordetella pertussis by PCR: NOT DETECTED
CORONAVIRUS 229E: NOT DETECTED
CORONAVIRUS HKU1: NOT DETECTED
CORONAVIRUS NL63: NOT DETECTED
CORONAVIRUS OC43: NOT DETECTED
Chlamydophila pneumoniae DNA, QL, PCR: NOT DETECTED
Chlamydophilia pneumoniae by PCR: NOT DETECTED
Coronavirus 229E: NOT DETECTED
Coronavirus CVNL63: NOT DETECTED
Coronavirus HKU1: NOT DETECTED
Coronavirus OC43: NOT DETECTED
INFLUENZA A: NOT DETECTED
INFLUENZA B: NOT DETECTED
Influenza A: NOT DETECTED
Influenza B: NOT DETECTED
Metapneumovirus: NOT DETECTED
Metapneumovirus: NOT DETECTED
Mycoplasma pneumoniae DNA, QL, PCR: NOT DETECTED
Mycoplasma pneumoniae by PCR: NOT DETECTED
PARAINFLUENZA 4: NOT DETECTED
Parainfluenza 1: NOT DETECTED
Parainfluenza 1: NOT DETECTED
Parainfluenza 2: NOT DETECTED
Parainfluenza 2: NOT DETECTED
Parainfluenza 3: NOT DETECTED
Parainfluenza 3: NOT DETECTED
Parainfluenza virus 4: NOT DETECTED
RSV by PCR: NOT DETECTED
RSV by PCR: NOT DETECTED
Rhinovirus Enterovirus PCR: NOT DETECTED
Rhinovirus and Enterovirus: NOT DETECTED
SARS Coronavirus-2: DETECTED — CR
SARS-CoV-2, PCR: DETECTED — CR

## 2020-05-03 LAB — C REACTIVE PROTEIN, QT: C-Reactive protein: 0.8 mg/dL — ABNORMAL HIGH (ref 0–0.3)

## 2020-05-03 LAB — TROPONIN-HIGH SENSITIVITY: Troponin-High Sensitivity: 8 ng/L (ref 0–54)

## 2020-05-03 LAB — PROCALCITONIN
Procalcitonin: <0.05 ng/mL
Procalcitonin: <0.05 ng/mL

## 2020-05-03 LAB — NT-PRO BNP: NT pro-BNP: 1579 PG/ML — ABNORMAL HIGH (ref 0–900)

## 2020-05-03 LAB — LD: LD: 248 U/L — ABNORMAL HIGH (ref 81–234)

## 2020-05-03 LAB — FERRITIN
Ferritin: 64 NG/ML (ref 8–388)
Ferritin: 64 NG/ML (ref 8–388)

## 2020-05-03 LAB — CBC WITH AUTO DIFFERENTIAL
Basophils %: 1 % (ref 0–2)
Basophils Absolute: 0 10*3/uL (ref 0.0–0.1)
Eosinophils %: 0 % (ref 0–5)
Eosinophils Absolute: 0 10*3/uL (ref 0.0–0.4)
Granulocyte Absolute Count: 0 10*3/uL (ref 0.00–0.04)
Hematocrit: 43.1 % (ref 35.0–45.0)
Hemoglobin: 13.9 g/dL (ref 12.0–16.0)
Immature Granulocytes: 0 % (ref 0.0–0.5)
Lymphocytes %: 16 % — ABNORMAL LOW (ref 21–52)
Lymphocytes Absolute: 0.4 10*3/uL — ABNORMAL LOW (ref 0.9–3.6)
MCH: 29.3 PG (ref 24.0–34.0)
MCHC: 32.3 g/dL (ref 31.0–37.0)
MCV: 90.7 FL (ref 78.0–100.0)
MPV: 10.1 FL (ref 9.2–11.8)
Monocytes %: 15 % — ABNORMAL HIGH (ref 3–10)
Monocytes Absolute: 0.4 10*3/uL (ref 0.05–1.2)
NRBC Absolute: 0 10*3/uL (ref 0.00–0.01)
Neutrophils %: 67 % (ref 40–73)
Neutrophils Absolute: 1.8 10*3/uL (ref 1.8–8.0)
Nucleated RBCs: 0 PER 100 WBC
Platelets: 200 10*3/uL (ref 135–420)
RBC: 4.75 M/uL (ref 4.20–5.30)
RDW: 13.2 % (ref 11.6–14.5)
WBC: 2.7 10*3/uL — ABNORMAL LOW (ref 4.6–13.2)

## 2020-05-03 LAB — EKG 12-LEAD
Atrial Rate: 88 {beats}/min
Diagnosis: NORMAL
P Axis: 95 degrees
P-R Interval: 140 ms
Q-T Interval: 354 ms
QRS Duration: 90 ms
QTc Calculation (Bazett): 428 ms
R Axis: 45 degrees
T Axis: 145 degrees
Ventricular Rate: 88 {beats}/min

## 2020-05-03 LAB — COMPREHENSIVE METABOLIC PANEL
ALT: 32 U/L (ref 13–56)
AST: 30 U/L (ref 10–38)
Albumin/Globulin Ratio: 1.4 (ref 0.8–1.7)
Albumin: 4.2 g/dL (ref 3.4–5.0)
Alkaline Phosphatase: 55 U/L (ref 45–117)
Anion Gap: 10 mmol/L (ref 3.0–18)
BUN: 8 MG/DL (ref 7.0–18)
Bun/Cre Ratio: 8 — ABNORMAL LOW (ref 12–20)
CO2: 27 mmol/L (ref 21–32)
Calcium: 9.2 MG/DL (ref 8.5–10.1)
Chloride: 103 mmol/L (ref 100–111)
Creatinine: 0.99 MG/DL (ref 0.6–1.3)
EGFR IF NonAfrican American: 59 mL/min/{1.73_m2} — ABNORMAL LOW (ref >60)
GFR African American: >60 mL/min/{1.73_m2} (ref >60)
Globulin: 3.1 g/dL (ref 2.0–4.0)
Glucose: 98 mg/dL (ref 74–99)
Potassium: 3.6 mmol/L (ref 3.5–5.5)
Sodium: 140 mmol/L (ref 136–145)
Total Bilirubin: 0.6 MG/DL (ref 0.2–1.0)
Total Protein: 7.3 g/dL (ref 6.4–8.2)

## 2020-05-03 LAB — POCT LACTIC ACID: POC Lactic Acid: 0.65 mmol/L (ref 0.40–2.00)

## 2020-05-03 LAB — D-DIMER, QUANTITATIVE: D-Dimer, Quant: 0.34 ug/ml(FEU) (ref <0.46)

## 2020-05-03 LAB — LACTATE DEHYDROGENASE: LD: 248 U/L — ABNORMAL HIGH (ref 81–234)

## 2020-05-03 LAB — PROBNP, N-TERMINAL: BNP: 1579 PG/ML — ABNORMAL HIGH (ref 0–900)

## 2020-05-03 LAB — TROPONIN, HIGH SENSITIVITY: Troponin, High Sensitivity: 8 ng/L (ref 0–54)

## 2020-05-03 LAB — C-REACTIVE PROTEIN: CRP: 0.8 mg/dL — ABNORMAL HIGH (ref 0–0.3)

## 2020-05-03 MED ORDER — ONDANSETRON 4 MG TAB, RAPID DISSOLVE
4 mg | Freq: Three times a day (TID) | ORAL | Status: DC | PRN
Start: 2020-05-03 — End: 2020-05-15

## 2020-05-03 MED ORDER — ENOXAPARIN 40 MG/0.4 ML SUB-Q SYRINGE
40 mg/0.4 mL | Freq: Two times a day (BID) | SUBCUTANEOUS | Status: DC
Start: 2020-05-03 — End: 2020-05-03
  Administered 2020-05-03: 17:00:00 via SUBCUTANEOUS

## 2020-05-03 MED ORDER — ENOXAPARIN 60 MG/0.6 ML SUB-Q SYRINGE
60 mg/0.6 mL | Freq: Two times a day (BID) | SUBCUTANEOUS | Status: DC
Start: 2020-05-03 — End: 2020-05-03

## 2020-05-03 MED ORDER — FUROSEMIDE 10 MG/ML IJ SOLN
10 mg/mL | Freq: Once | INTRAMUSCULAR | Status: AC
Start: 2020-05-03 — End: 2020-05-03
  Administered 2020-05-03: 17:00:00 via INTRAVENOUS

## 2020-05-03 MED ORDER — FUROSEMIDE 40 MG TAB
40 mg | Freq: Every day | ORAL | Status: DC
Start: 2020-05-03 — End: 2020-05-15
  Administered 2020-05-04 – 2020-05-06 (×3): via ORAL

## 2020-05-03 MED ORDER — PHARMACY WARFARIN NOTE
Status: DC
Start: 2020-05-03 — End: 2020-05-15

## 2020-05-03 MED ORDER — LAMOTRIGINE 25 MG TAB
25 mg | Freq: Every day | ORAL | Status: DC
Start: 2020-05-03 — End: 2020-05-04

## 2020-05-03 MED ORDER — SODIUM CHLORIDE 0.9 % IV
4 mg/mL | INTRAVENOUS | Status: DC
Start: 2020-05-03 — End: 2020-05-03
  Administered 2020-05-03: 18:00:00 via INTRAVENOUS

## 2020-05-03 MED ORDER — ACETAMINOPHEN 325 MG TABLET
325 mg | ORAL | Status: AC
Start: 2020-05-03 — End: 2020-05-03
  Administered 2020-05-03: 15:00:00 via ORAL

## 2020-05-03 MED ORDER — HYDROXYZINE 10 MG TAB
10 mg | Freq: Three times a day (TID) | ORAL | Status: DC | PRN
Start: 2020-05-03 — End: 2020-05-15
  Administered 2020-05-03 – 2020-05-07 (×2): via ORAL

## 2020-05-03 MED ORDER — PREGABALIN 25 MG CAP
25 mg | Freq: Three times a day (TID) | ORAL | Status: DC
Start: 2020-05-03 — End: 2020-05-15
  Administered 2020-05-03 – 2020-05-15 (×37): via ORAL

## 2020-05-03 MED ORDER — PANTOPRAZOLE 40 MG TAB, DELAYED RELEASE
40 mg | Freq: Every day | ORAL | Status: DC
Start: 2020-05-03 — End: 2020-05-15
  Administered 2020-05-04 – 2020-05-15 (×12): via ORAL

## 2020-05-03 MED ORDER — SODIUM CHLORIDE 0.9 % IJ SYRG
INTRAMUSCULAR | Status: DC | PRN
Start: 2020-05-03 — End: 2020-05-15

## 2020-05-03 MED ORDER — DEXAMETHASONE SODIUM PHOSPHATE 4 MG/ML IJ SOLN
4 mg/mL | Freq: Once | INTRAMUSCULAR | Status: AC
Start: 2020-05-03 — End: 2020-05-03
  Administered 2020-05-03: 17:00:00 via INTRAVENOUS

## 2020-05-03 MED ORDER — FLUTICASONE 50 MCG/ACTUATION NASAL SPRAY, SUSP
50 mcg/actuation | Freq: Every day | NASAL | Status: DC
Start: 2020-05-03 — End: 2020-05-15
  Administered 2020-05-05 – 2020-05-15 (×11): via NASAL

## 2020-05-03 MED ORDER — CETIRIZINE 10 MG TAB
10 mg | Freq: Every day | ORAL | Status: DC
Start: 2020-05-03 — End: 2020-05-15
  Administered 2020-05-04 – 2020-05-15 (×12): via ORAL

## 2020-05-03 MED ORDER — SODIUM CHLORIDE 0.9 % IV
4 mg/mL | INTRAVENOUS | Status: DC
Start: 2020-05-03 — End: 2020-05-03

## 2020-05-03 MED ORDER — IPRATROPIUM 20 MCG-ALBUTEROL 100 MCG/ACTUATION MIST FOR INHALATION
20-100 mcg/actuation | Freq: Four times a day (QID) | RESPIRATORY_TRACT | Status: DC
Start: 2020-05-03 — End: 2020-05-13
  Administered 2020-05-04 – 2020-05-14 (×31): via RESPIRATORY_TRACT

## 2020-05-03 MED ORDER — PRAVASTATIN 20 MG TAB
20 mg | Freq: Every evening | ORAL | Status: DC
Start: 2020-05-03 — End: 2020-05-15
  Administered 2020-05-04 – 2020-05-15 (×12): via ORAL

## 2020-05-03 MED ORDER — ACETAMINOPHEN 500 MG TAB
500 mg | Freq: Three times a day (TID) | ORAL | Status: DC | PRN
Start: 2020-05-03 — End: 2020-05-04
  Administered 2020-05-03 – 2020-05-04 (×4): via ORAL

## 2020-05-03 MED ORDER — METOPROLOL SUCCINATE SR 25 MG 24 HR TAB
25 mg | Freq: Every day | ORAL | Status: DC
Start: 2020-05-03 — End: 2020-05-15
  Administered 2020-05-04 – 2020-05-06 (×3): via ORAL

## 2020-05-03 MED ORDER — ACETAMINOPHEN 325 MG TABLET
325 mg | Freq: Four times a day (QID) | ORAL | Status: DC | PRN
Start: 2020-05-03 — End: 2020-05-03

## 2020-05-03 MED ORDER — ENOXAPARIN 100 MG/ML SUB-Q SYRINGE
100 mg/mL | Freq: Two times a day (BID) | SUBCUTANEOUS | Status: DC
Start: 2020-05-03 — End: 2020-05-04

## 2020-05-03 MED ORDER — ALBUTEROL SULFATE HFA 90 MCG/ACTUATION AEROSOL INHALER
90 mcg/actuation | Freq: Four times a day (QID) | RESPIRATORY_TRACT | Status: DC | PRN
Start: 2020-05-03 — End: 2020-05-15
  Administered 2020-05-04 – 2020-05-12 (×4): via RESPIRATORY_TRACT

## 2020-05-03 MED ORDER — ACETAMINOPHEN 650 MG RECTAL SUPPOSITORY
650 mg | Freq: Four times a day (QID) | RECTAL | Status: DC | PRN
Start: 2020-05-03 — End: 2020-05-03

## 2020-05-03 MED ORDER — DEXAMETHASONE SODIUM PHOSPHATE 4 MG/ML IJ SOLN
4 mg/mL | INTRAMUSCULAR | Status: DC
Start: 2020-05-03 — End: 2020-05-11
  Administered 2020-05-03 – 2020-05-11 (×8): via INTRAVENOUS

## 2020-05-03 MED ORDER — SODIUM CHLORIDE 0.9 % IJ SYRG
Freq: Three times a day (TID) | INTRAMUSCULAR | Status: DC
Start: 2020-05-03 — End: 2020-05-15
  Administered 2020-05-03 – 2020-05-15 (×34): via INTRAVENOUS

## 2020-05-03 MED ORDER — ONDANSETRON (PF) 4 MG/2 ML INJECTION
4 mg/2 mL | Freq: Four times a day (QID) | INTRAMUSCULAR | Status: DC | PRN
Start: 2020-05-03 — End: 2020-05-15
  Administered 2020-05-03: 19:00:00 via INTRAVENOUS

## 2020-05-03 MED ORDER — CHOLECALCIFEROL (VITAMIN D3) 1,000 UNIT (25 MCG) TAB
Freq: Every day | ORAL | Status: DC
Start: 2020-05-03 — End: 2020-05-15
  Administered 2020-05-04 – 2020-05-15 (×12): via ORAL

## 2020-05-03 MED FILL — BD POSIFLUSH NORMAL SALINE 0.9 % INJECTION SYRINGE: INTRAMUSCULAR | Qty: 40

## 2020-05-03 MED FILL — ACETAMINOPHEN 500 MG TAB: 500 mg | ORAL | Qty: 2

## 2020-05-03 MED FILL — HYDROXYZINE 25 MG TAB: 25 mg | ORAL | Qty: 1

## 2020-05-03 MED FILL — ENOXAPARIN 60 MG/0.6 ML SUB-Q SYRINGE: 60 mg/0.6 mL | SUBCUTANEOUS | Qty: 0.6

## 2020-05-03 MED FILL — DEXAMETHASONE SODIUM PHOSPHATE 4 MG/ML IJ SOLN: 4 mg/mL | INTRAMUSCULAR | Qty: 2

## 2020-05-03 MED FILL — MAPAP (ACETAMINOPHEN) 325 MG TABLET: 325 mg | ORAL | Qty: 3

## 2020-05-03 MED FILL — ONDANSETRON (PF) 4 MG/2 ML INJECTION: 4 mg/2 mL | INTRAMUSCULAR | Qty: 2

## 2020-05-03 MED FILL — FUROSEMIDE 10 MG/ML IJ SOLN: 10 mg/mL | INTRAMUSCULAR | Qty: 4

## 2020-05-03 MED FILL — PHARMACY WARFARIN NOTE: Qty: 1

## 2020-05-03 MED FILL — PREGABALIN 50 MG CAP: 50 mg | ORAL | Qty: 1

## 2020-05-03 NOTE — ED Notes (Signed)
Pt arrived via EMS for reported flu symptoms.  Pt reports productive cough, headache, and shortness of breath.  Pt states she used her inhaler several times at home.  Pt also states her temp was 101.6 this morning.  Pt also reports burning in her chest and states she had a head cath on Monday.

## 2020-05-03 NOTE — ED Notes (Signed)
Attempted to call report. Told nurse was in a contact room and will call back.

## 2020-05-03 NOTE — ED Provider Notes (Signed)
EMERGENCY DEPARTMENT HISTORY AND PHYSICAL EXAM    I have evaluated the patient at 10:06 AM      Date: 05/03/2020  Patient Name: Mary [name]    History of Presenting Illness     Chief Complaint   Patient presents with   ??? Shortness of Breath   ??? Chest Pain   ??? Cough   ??? Fever         History Provided By: Patient  Location/Duration/Severity/Modifying factors   54 year old female with history of bipolar disorder, asthma, COPD, aortic valve replacement CHF presenting to the emergency department for evaluation of fever and shortness of breath.  Patient reports feeling this way since yesterday evening.  Reports malaise, myalgias, cough, mild chest pain, and shortness of breath.  She is not vaccinated against Covid.  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           PCP: Izora Ribas, MD    Current Facility-Administered Medications   Medication Dose Route Frequency Provider Last Rate Last Admin   ??? furosemide (LASIX) injection 40 mg  40 mg IntraVENous ONCE Jamarius Saha, DO       ??? dexamethasone (DECADRON) 4 mg/mL injection 6 mg  6 mg IntraVENous ONCE Retta Diones, DO         Current Outpatient Medications   Medication Sig Dispense Refill   ??? pregabalin (LYRICA) 200 mg capsule take 1 capsule by mouth three times a day 90 Capsule 1   ??? enoxaparin (Lovenox) 60 mg/0.6 mL injection 60 mg by SubCUTAneous route every twelve (12) hours. 6 Each 0   ??? albuterol (PROVENTIL HFA, VENTOLIN HFA, PROAIR HFA) 90 mcg/actuation inhaler Take 2 Puffs by inhalation every six (6) hours as needed.     ??? cetirizine (ZYRTEC) 10 mg tablet Take 10 mg by mouth daily.     ??? fluticasone propionate (FLONASE) 50 mcg/actuation nasal spray 1 Spray by Nasal route daily.     ??? furosemide (LASIX) 40 mg tablet TAKE ONE TABLET BY MOUTH EVERY DAY     ??? ipratropium-albuteroL (Combivent Respimat) 20-100 mcg/actuation inhaler Take 1 Puff by inhalation.     ??? Combivent Respimat 20-100  mcg/actuation inhaler INHALE 1 PUFF BY MOUTH INTO LUNGS EVERY 6 HOURS     ??? lamoTRIgine (LaMICtal) 25 mg tablet TAKE 3 TABLETS BY MOUTH ONCE DAILY     ??? metoprolol succinate (TOPROL-XL) 25 mg XL tablet      ??? omeprazole (PRILOSEC) 40 mg capsule Take 40 mg by mouth daily.     ??? pravastatin (PRAVACHOL) 40 mg tablet Take 40 mg by mouth nightly.       ??? warfarin (COUMADIN) 2.5 mg tablet TAKE 2 TABLETS BY MOUTH ONCE DAILY ON MONDAY WEDNESDAY FRIDAY AND TAKE 1 & 1 2 (ONE AND ONE HALF) ALL OTHER DAYS         Past History     Past Medical History:  Past Medical History:   Diagnosis Date   ??? Anxiety    ??? Asthma    ??? Bipolar 1 disorder ([place])    ??? CHF (congestive heart failure) ([place])    ??? COPD (chronic obstructive pulmonary disease) ([place])    ??? H/O aortic valve replacement    ??? PTSD (post-traumatic stress disorder)        Past Surgical History:  Past Surgical History:   Procedure Laterality Date   ??? HX HEART CATHETERIZATION     ???  HX HEART VALVE SURGERY         Family History:  Family History   Problem Relation Age of Onset   ??? No Known Problems Mother        Social History:  Social History     Tobacco Use   ??? Smoking status: Current Every Day Smoker     Packs/day: 1.00   ??? Smokeless tobacco: Never Used   Substance Use Topics   ??? Alcohol use: Never   ??? Drug use: Yes     Types: Marijuana       Allergies:  Allergies   Allergen Reactions   ??? Adhesive Rash   ??? Lithium Nausea and Vomiting         Review of Systems       Review of Systems   Constitutional: Positive for chills, fatigue and fever. Negative for activity change and diaphoresis.   HENT: Negative for congestion, sinus pressure, sinus pain and sore throat.    Respiratory: Positive for shortness of breath. Negative for cough, chest tightness, wheezing and stridor.    Cardiovascular: Positive for chest pain. Negative for palpitations.   Gastrointestinal: Negative for abdominal distention, abdominal pain, constipation, diarrhea, nausea and vomiting.   Genitourinary: Negative  for difficulty urinating, dysuria and hematuria.   Musculoskeletal: Negative for back pain, joint swelling and myalgias.   Skin: Negative for rash and wound.   Neurological: Negative for dizziness, weakness and headaches.   Psychiatric/Behavioral: Negative for agitation. The patient is not nervous/anxious.          Physical Exam     Visit Vitals  BP 119/70   Pulse 91   Temp (!) 100.5 ??F (38.1 ??C)   Resp 20   Ht 5' 3"  (1.6 m)   Wt 89.4 kg (197 lb)   SpO2 90%   BMI 34.90 kg/m??         Physical Exam  Constitutional:       General: She is not in acute distress.     Appearance: She is not toxic-appearing.   HENT:      Head: Normocephalic and atraumatic.   Cardiovascular:      Rate and Rhythm: Normal rate and regular rhythm.      Heart sounds: Normal heart sounds. No murmur heard.  No friction rub. No gallop.    Pulmonary:      Effort: Pulmonary effort is normal.      Breath sounds: Normal breath sounds.   Abdominal:      General: There is no distension.      Palpations: Abdomen is soft. There is no mass.      Tenderness: There is no abdominal tenderness. There is no guarding.      Hernia: No hernia is present.   Musculoskeletal:         General: No swelling, tenderness or deformity.      Cervical back: Normal range of motion and neck supple.   Skin:     General: Skin is warm and dry.      Capillary Refill: Capillary refill takes less than 2 seconds.      Findings: No rash.   Neurological:      General: No focal deficit present.      Mental Status: She is alert and oriented to person, place, and time.   Psychiatric:         Mood and Affect: Mood normal.           Diagnostic Study Results  Labs -  Recent Results (from the past 12 hour(s))   EKG, 12 LEAD, INITIAL    Collection Time: 05/03/20  9:29 AM   Result Value Ref Range    Ventricular Rate 88 BPM    Atrial Rate 88 BPM    P-R Interval 140 ms    QRS Duration 90 ms    Q-T Interval 354 ms    QTC Calculation (Bezet) 428 ms    Calculated P Axis 95 degrees    Calculated R  Axis 45 degrees    Calculated T Axis 145 degrees    Diagnosis       Normal sinus rhythm  ST & T wave abnormality, consider inferior ischemia  ST & T wave abnormality, consider anterolateral ischemia  Abnormal ECG  When compared with ECG of 05-Apr-2020 12:40,  T wave inversion now evident in Lateral leads  Confirmed by Gerre Scull, MD, ----- ([id number]) on 05/03/2020 10:52:41 AM     POC LACTIC ACID    Collection Time: 05/03/20  9:38 AM   Result Value Ref Range    Lactic Acid (POC) 0.65 0.40 - 2.00 mmol/L   CBC WITH AUTOMATED DIFF    Collection Time: 05/03/20  9:40 AM   Result Value Ref Range    WBC 2.7 (L) 4.6 - 13.2 K/uL    RBC 4.75 4.20 - 5.30 M/uL    HGB 13.9 12.0 - 16.0 g/dL    HCT 43.1 35.0 - 45.0 %    MCV 90.7 78.0 - 100.0 FL    MCH 29.3 24.0 - 34.0 PG    MCHC 32.3 31.0 - 37.0 g/dL    RDW 13.2 11.6 - 14.5 %    PLATELET 200 135 - 420 K/uL    MPV 10.1 9.2 - 11.8 FL    NRBC 0.0 0 PER 100 WBC    ABSOLUTE NRBC 0.00 0.00 - 0.01 K/uL    NEUTROPHILS 67 40 - 73 %    LYMPHOCYTES 16 (L) 21 - 52 %    MONOCYTES 15 (H) 3 - 10 %    EOSINOPHILS 0 0 - 5 %    BASOPHILS 1 0 - 2 %    IMMATURE GRANULOCYTES 0 0.0 - 0.5 %    ABS. NEUTROPHILS 1.8 1.8 - 8.0 K/UL    ABS. LYMPHOCYTES 0.4 (L) 0.9 - 3.6 K/UL    ABS. MONOCYTES 0.4 0.05 - 1.2 K/UL    ABS. EOSINOPHILS 0.0 0.0 - 0.4 K/UL    ABS. BASOPHILS 0.0 0.0 - 0.1 K/UL    ABS. IMM. GRANS. 0.0 0.00 - 0.04 K/UL    DF AUTOMATED     METABOLIC PANEL, COMPREHENSIVE    Collection Time: 05/03/20  9:40 AM   Result Value Ref Range    Sodium 140 136 - 145 mmol/L    Potassium 3.6 3.5 - 5.5 mmol/L    Chloride 103 100 - 111 mmol/L    CO2 27 21 - 32 mmol/L    Anion gap 10 3.0 - 18 mmol/L    Glucose 98 74 - 99 mg/dL    BUN 8 7.0 - 18 MG/DL    Creatinine 0.99 0.6 - 1.3 MG/DL    BUN/Creatinine ratio 8 (L) 12 - 20      GFR est AA >60 >60 ml/min/1.28m    GFR est non-AA 59 (L) >60 ml/min/1.730m   Calcium 9.2 8.5 - 10.1 MG/DL    Bilirubin, total 0.6 0.2 - 1.0 MG/DL    ALT (SGPT)  32 13 - 56 U/L    AST (SGOT)  30 10 - 38 U/L    Alk. phosphatase 55 45 - 117 U/L    Protein, total 7.3 6.4 - 8.2 g/dL    Albumin 4.2 3.4 - 5.0 g/dL    Globulin 3.1 2.0 - 4.0 g/dL    A-G Ratio 1.4 0.8 - 1.7     NT-PRO BNP    Collection Time: 05/03/20  9:40 AM   Result Value Ref Range    NT pro-BNP 1,579 (H) 0 - 900 PG/ML   TROPONIN-HIGH SENSITIVITY    Collection Time: 05/03/20  9:40 AM   Result Value Ref Range    Troponin-High Sensitivity 8 0 - 54 ng/L   RESPIRATORY VIRUS PANEL W/COVID-19, PCR    Collection Time: 05/03/20 10:10 AM    Specimen: Nasopharyngeal   Result Value Ref Range    Adenovirus Not detected NOTD      Coronavirus 229E Not detected NOTD      Coronavirus HKU1 Not detected NOTD      Coronavirus CVNL63 Not detected NOTD      Coronavirus OC43 Not detected NOTD      SARS-CoV-2, PCR Detected (AA) NOTD      Metapneumovirus Not detected NOTD      Rhinovirus and Enterovirus Not detected NOTD      Influenza A Not detected NOTD      Influenza B Not detected NOTD      Parainfluenza 1 Not detected NOTD      Parainfluenza 2 Not detected NOTD      Parainfluenza 3 Not detected NOTD      Parainfluenza virus 4 Not detected NOTD      RSV by PCR Not detected NOTD      B. parapertussis, PCR Not detected NOTD      Bordetella pertussis - PCR Not detected NOTD      Chlamydophila pneumoniae DNA, QL, PCR Not detected NOTD      Mycoplasma pneumoniae DNA, QL, PCR Not detected NOTD         Radiologic Studies -   XR CHEST PORT    (Results Pending)         Medical Decision Making   I am the first provider for this patient.    I reviewed the vital signs, available nursing notes, past medical history, past surgical history, family history and social history.    Vital Signs-Reviewed the patient's vital signs.      EKG: Diffuse ST depression in the infero lateral leads slightly worse when compared to previous EKG.  Records Reviewed: Nursing Notes, Old Medical Records, Previous electrocardiograms, Previous Radiology Studies and Previous Laboratory Studies (Time of  Review: 10:06 AM)    ED Course: Progress Notes, Reevaluation, and Consults:         Provider Notes (Medical Decision Making):   MDM  Number of Diagnoses or Management Options  Acute respiratory failure with hypoxia ([place])  COVID-19  Diagnosis management comments: 54 year old female presenting for evaluation of shortness of breath and fever chills.  She is febrile temperature 100.5.  Saturating low 90s, she has been placed on 2L nasal cannula.  I suspect Covid 19.  She will be swabbed in addition to screening lab work and chest x-ray.  Her blood work does demonstrate a leukopenia 2.7.  BNP is also elevated greater than 1500.  She will be given Lasix for this.  Opponent otherwise negative.  Nonactionable chemistry.  Negative troponin    1117:  Patient is positive for COVID-19.  She will be given Decadron and admitted for further treatment and optimization of hypoxic respiratory failure in the setting of COVID-19.        Diagnosis     Clinical Impression:   1. Acute respiratory failure with hypoxia ([place])    2. COVID-19        Disposition: admitted    Follow-up Information    None          Patient's Medications   Start Taking    No medications on file   Continue Taking    ALBUTEROL (PROVENTIL HFA, VENTOLIN HFA, PROAIR HFA) 90 MCG/ACTUATION INHALER    Take 2 Puffs by inhalation every six (6) hours as needed.    CETIRIZINE (ZYRTEC) 10 MG TABLET    Take 10 mg by mouth daily.    COMBIVENT RESPIMAT 20-100 MCG/ACTUATION INHALER    INHALE 1 PUFF BY MOUTH INTO LUNGS EVERY 6 HOURS    ENOXAPARIN (LOVENOX) 60 MG/0.6 ML INJECTION    60 mg by SubCUTAneous route every twelve (12) hours.    FLUTICASONE PROPIONATE (FLONASE) 50 MCG/ACTUATION NASAL SPRAY    1 Spray by Nasal route daily.    FUROSEMIDE (LASIX) 40 MG TABLET    TAKE ONE TABLET BY MOUTH EVERY DAY    IPRATROPIUM-ALBUTEROL (COMBIVENT RESPIMAT) 20-100 MCG/ACTUATION INHALER    Take 1 Puff by inhalation.    LAMOTRIGINE (LAMICTAL) 25 MG TABLET    TAKE 3 TABLETS BY MOUTH ONCE DAILY     METOPROLOL SUCCINATE (TOPROL-XL) 25 MG XL TABLET        OMEPRAZOLE (PRILOSEC) 40 MG CAPSULE    Take 40 mg by mouth daily.    PRAVASTATIN (PRAVACHOL) 40 MG TABLET    Take 40 mg by mouth nightly.      PREGABALIN (LYRICA) 200 MG CAPSULE    take 1 capsule by mouth three times a day    WARFARIN (COUMADIN) 2.5 MG TABLET    TAKE 2 TABLETS BY MOUTH ONCE DAILY ON MONDAY WEDNESDAY FRIDAY AND TAKE 1 & 1 2 (ONE AND ONE HALF) ALL OTHER DAYS   These Medications have changed    No medications on file   Stop Taking    No medications on file     Disclaimer: Sections of this note are dictated using utilizing voice recognition software.  Minor typographical errors may be present. If questions arise, please do not hesitate to contact me or call our department.

## 2020-05-03 NOTE — H&P (Signed)
H&P by Berlinda Last, MD  at 05/03/20 1314                Author: Berlinda Last, MD  Service: FAMILY MEDICINE  Author Type: Resident       Filed: 05/03/20 1605  Date of Service: 05/03/20 1314  Status: Attested           Editor: Berlinda Last, MD (Resident)  Cosigner: Joanna Hews, MD at 05/03/20 1607          Attestation signed by Joanna Hews, MD at 05/03/20 1607          I have personally seen and evaluated this patient.  I have personally reviewed the resident note.  I have personally discussed the management and plan of  care of this patient.  I agree with the note as written.       [name] said that she had a heart cath this past Monday and hasn't been feeling well since. Endorsed fevers earlier today (but stated that she felt quite poor yesterday as well). Endorses some difficulty  with breathing and 'heaviness' in the 'back of the lungs' but not necessarily the chest.        Has >40 pack year smoking history. Not COVID vaccinated.        Lungs had diffuse crackles on exam and [name] was coughing throughout the lung exam. Heart was slightly tachycardic but otherwise regular. NABS. Good DP pulses. Was diaphoretic.        COVID positive. Hypoxemia in room air - needs around 1-2LNC for hypoxemia at this current time. Obtain ABG. Leukopenia on admission as well. D-Dimer normal - no need to pursue CT chest at this time.  Low Pro-Cal, doubt has bacterial infection and does not need ABX at this time.        Normal kidney function. BNP elevated but has some ST-wave depressions on leads II and AVF as well as V4, V5 and V6. Need to trend troponins q 4-6 hours. Obtain echo given CTA chest findings of IV contrast  reflux in 03/2020 and concern for increased pulmonary HTN.        Dexamethasone 80m q24 hours. Continue Lovenox BID for Afib (uses this as a bridge as she is on Warfarin for her Afib) but needs to be 164mkg BID (approximately 9079mID of Lovenox and not 76m76mFollow  daily INRs.         ID consulted - appreciate assistance.                                       Admission History and Physical   [facility]             Patient: Mary OhnemusN: [id number]: [id number]      Date of Birth: [birth year]ge: 54 y68.   Sex: female         Admission Date: 05/03/2020           HPI:        Mary [name] 54 y52.  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      Patient evaluated at bedside.  Patient appeared to be very anxious and tearful about her diagnosis of COVID-19 infection.  Patient stated that her symptoms started yesterday and that she noted a fever of 101.42F yesterday.  Patient also stated that she  used her albuterol multiple times yesterday due to shortness of breath.  Patient stated that she is on Combivent 4 times daily usually.  Patient stated that she does have some nausea, headache, dizziness however denied any vomiting, abdominal pain, diarrhea,  constipation, blurred vision, dysuria, frequency, falls.      Of note patient is not vaccinated against COVID-19, and has never had COVID-19 infection in the past.      ED Course (See objective for values/interpretations):   Labs obtained: RPR, CBC, CMP  Medications administered: Dexamethasone '6mg'$  IV    Imaging obtained:CXR      Review of Systems    Constitutional: Positive for chills, fever  and malaise/fatigue.    HENT: Positive for congestion. Negative for sore throat.     Eyes: Negative for blurred vision and double vision.    Respiratory: Positive for cough, shortness of breath  and wheezing.     Cardiovascular: Negative for chest pain, palpitations and leg swelling.    Gastrointestinal: Positive for nausea. Negative for abdominal pain, constipation, diarrhea and  vomiting.    Genitourinary: Negative for dysuria and frequency.    Musculoskeletal: Negative for falls.    Skin: Negative for itching and rash.    Neurological: Positive for dizziness and headaches .         Past Medical History:        Diagnosis  Date         ?  Anxiety       ?  Asthma       ?  Bipolar 1 disorder ([place])       ?  CHF (congestive heart failure) ([place])       ?  COPD (chronic obstructive pulmonary disease) ([place])       ?  H/O aortic valve replacement           ?  PTSD (post-traumatic stress disorder)               Past Surgical History:         Procedure  Laterality  Date          ?  HX HEART CATHETERIZATION              ?  HX HEART VALVE SURGERY                 Family History         Problem  Relation  Age of Onset          ?  No Known Problems  Mother               Social History          Socioeconomic History         ?  Marital status:  DIVORCED       Tobacco Use         ?  Smoking status:  Current Every Day Smoker              Packs/day:  1.00         ?  Smokeless tobacco:  Never Used       Substance and Sexual Activity         ?  Alcohol use:  Never     ?  Drug use:  Yes              Types:  Marijuana             Allergies        Allergen  Reactions         ?  Adhesive  Rash         ?  Lithium  Nausea and Vomiting             Prior to Admission Medications     Prescriptions  Last Dose  Informant  Patient Reported?  Taking?      Combivent Respimat 20-100 mcg/actuation inhaler  05/02/2020 at Unknown time    Yes  Yes      Sig: INHALE 1 PUFF BY MOUTH INTO LUNGS EVERY 6 HOURS      acetaminophen (Tylenol Extra Strength) 500 mg tablet  05/02/2020 at Unknown time    Yes  Yes      Sig: Take 1,000 mg by mouth every eight (8) hours as needed for Pain.      albuterol (PROVENTIL HFA, VENTOLIN HFA, PROAIR HFA) 90 mcg/actuation inhaler  05/02/2020 at Unknown time    Yes  Yes      Sig: Take 2 Puffs by inhalation every six (6) hours as needed.      cetirizine (ZYRTEC) 10 mg tablet  05/02/2020 at Unknown time    Yes   Yes      Sig: Take 10 mg by mouth daily.      cyclobenzaprine (FLEXERIL) 5 mg tablet  05/02/2020 at Unknown time    Yes  Yes      Sig: Take 5 mg by mouth nightly as needed for Muscle Spasm(s).      enoxaparin (Lovenox) 40 mg/0.4 mL  05/02/2020 at Unknown time    Yes  Yes      Sig: 60 mg by SubCUTAneous route every twelve (12) hours every twelve (12) hours.      fluticasone propionate (FLONASE) 50 mcg/actuation nasal spray  05/02/2020 at Unknown time    Yes  Yes      Sig: 1 Spray by Nasal route daily.      furosemide (LASIX) 40 mg tablet  05/02/2020 at Unknown time    Yes  Yes      Sig: TAKE ONE TABLET BY MOUTH EVERY DAY      hydrOXYzine HCL (ATARAX) 25 mg tablet  05/02/2020 at Unknown time    Yes  Yes      Sig: Take  by mouth nightly as needed.      lamoTRIgine (LaMICtal) 25 mg tablet  05/02/2020 at Unknown time    Yes  Yes      Sig: TAKE 3 TABLETS BY MOUTH ONCE DAILY      metoprolol succinate (TOPROL-XL) 25 mg XL tablet  05/02/2020 at Unknown time    Yes  Yes      omeprazole (PRILOSEC) 40 mg capsule  05/02/2020 at Unknown time    Yes  Yes      Sig: Take 40 mg by mouth daily.      pravastatin (PRAVACHOL) 40 mg tablet  05/02/2020 at Unknown time    Yes  Yes      Sig: Take 40 mg by mouth nightly.        pregabalin (LYRICA) 200 mg capsule  05/02/2020 at Unknown time    No  Yes      Sig: take 1 capsule by mouth three times a  day      warfarin (COUMADIN) 2.5 mg tablet  05/02/2020 at Unknown time    Yes  Yes      Sig: TAKE 2 TABLETS BY MOUTH ONCE DAILY ON MONDAY WEDNESDAY FRIDAY AND TAKE 1 & 1 2 (ONE AND ONE HALF) ALL OTHER DAYS               Facility-Administered Medications: None             Physical Exam:        Patient Vitals for the past 24 hrs:            Temp  Pulse  Resp  BP  SpO2            05/03/20 1150  --  92  --  (!) 98/56  94 %            05/03/20 1120  --  89  --  (!) 105/49  90 %     05/03/20 1050  --  88  --  (!) 107/58  95 %     05/03/20 1020  --  85  --  (!) 114/49  90 %     05/03/20 0950  --  90  27   (!) 126/58  94 %     05/03/20 0920  (!) 100.5 ??F (38.1 ??C)  91  20  119/70  90 %            05/03/20 0920  --  93  --  --  --           Physical Exam:    General:  AAOx3, NAD    HEENT: Conjunctiva pink, sclera anicteric. PERRL. EOMI.  Pharynx moist, nonerythematous.  Moist mucous membranes.  No cervical, supraclavicular,  occipital or submandibular lymphadenopathy.  No other gross abnormalities present.   CV:  RRR,   No MRG   RESP:  Unlabored breathing, sating 94% 1.5L NC at rest, Diffuse wheezing bilateral posterior lung fields   ABD:  Soft, nontender, nondistended. BS (+).  No hepatosplenomegaly.  No suprapubic tenderness.    MS:  No joint deformity or instability.  No atrophy.   Neuro:  5/5 strength bilateral upper extremities and lower extremities.   Ext:  No edema.  2+ radial and dp pulses bilaterally.   Skin:  No rashes, lesions, or ulcers.  Good turgor.        Recent Results (from the past 12 hour(s))     EKG, 12 LEAD, INITIAL          Collection Time: 05/03/20  9:29 AM         Result  Value  Ref Range            Ventricular Rate  88  BPM       Atrial Rate  88  BPM       P-R Interval  140  ms       QRS Duration  90  ms       Q-T Interval  354  ms       QTC Calculation (Bezet)  428  ms       Calculated P Axis  95  degrees       Calculated R Axis  45  degrees       Calculated T Axis  145  degrees       Diagnosis  Normal sinus rhythm   ST & T wave abnormality, consider inferior ischemia   ST & T wave abnormality, consider anterolateral ischemia   Abnormal ECG   When compared with ECG of 05-Apr-2020 12:40,   T wave inversion now evident in Lateral leads   Confirmed by Gerre Scull, MD, ----- ([id number]) on 05/03/2020 10:52:41 AM          POC LACTIC ACID          Collection Time: 05/03/20  9:38 AM         Result  Value  Ref Range            Lactic Acid (POC)  0.65  0.40 - 2.00 mmol/L       CBC WITH AUTOMATED DIFF          Collection Time: 05/03/20  9:40 AM         Result  Value  Ref Range             WBC  2.7 (L)  4.6 - 13.2 K/uL       RBC  4.75  4.20 - 5.30 M/uL       HGB  13.9  12.0 - 16.0 g/dL       HCT  43.1  35.0 - 45.0 %       MCV  90.7  78.0 - 100.0 FL       MCH  29.3  24.0 - 34.0 PG       MCHC  32.3  31.0 - 37.0 g/dL       RDW  13.2  11.6 - 14.5 %       PLATELET  200  135 - 420 K/uL       MPV  10.1  9.2 - 11.8 FL       NRBC  0.0  0 PER 100 WBC       ABSOLUTE NRBC  0.00  0.00 - 0.01 K/uL       NEUTROPHILS  67  40 - 73 %       LYMPHOCYTES  16 (L)  21 - 52 %       MONOCYTES  15 (H)  3 - 10 %       EOSINOPHILS  0  0 - 5 %       BASOPHILS  1  0 - 2 %       IMMATURE GRANULOCYTES  0  0.0 - 0.5 %       ABS. NEUTROPHILS  1.8  1.8 - 8.0 K/UL       ABS. LYMPHOCYTES  0.4 (L)  0.9 - 3.6 K/UL       ABS. MONOCYTES  0.4  0.05 - 1.2 K/UL       ABS. EOSINOPHILS  0.0  0.0 - 0.4 K/UL       ABS. BASOPHILS  0.0  0.0 - 0.1 K/UL       ABS. IMM. GRANS.  0.0  0.00 - 0.04 K/UL       DF  AUTOMATED          METABOLIC PANEL, COMPREHENSIVE          Collection Time: 05/03/20  9:40 AM         Result  Value  Ref Range            Sodium  140  136 - 145 mmol/L       Potassium  3.6  3.5 - 5.5 mmol/L  Chloride  103  100 - 111 mmol/L       CO2  27  21 - 32 mmol/L       Anion gap  10  3.0 - 18 mmol/L       Glucose  98  74 - 99 mg/dL       BUN  8  7.0 - 18 MG/DL       Creatinine  0.99  0.6 - 1.3 MG/DL       BUN/Creatinine ratio  8 (L)  12 - 20         GFR est AA  >60  >60 ml/min/1.21m       GFR est non-AA  59 (L)  >60 ml/min/1.757m      Calcium  9.2  8.5 - 10.1 MG/DL       Bilirubin, total  0.6  0.2 - 1.0 MG/DL       ALT (SGPT)  32  13 - 56 U/L       AST (SGOT)  30  10 - 38 U/L       Alk. phosphatase  55  45 - 117 U/L       Protein, total  7.3  6.4 - 8.2 g/dL       Albumin  4.2  3.4 - 5.0 g/dL       Globulin  3.1  2.0 - 4.0 g/dL       A-G Ratio  1.4  0.8 - 1.7         NT-PRO BNP          Collection Time: 05/03/20  9:40 AM         Result  Value  Ref Range            NT pro-BNP  1,579 (H)  0 - 900 PG/ML       TROPONIN-HIGH SENSITIVITY           Collection Time: 05/03/20  9:40 AM         Result  Value  Ref Range            Troponin-High Sensitivity  8  0 - 54 ng/L       D DIMER          Collection Time: 05/03/20  9:40 AM         Result  Value  Ref Range            D DIMER  0.34  <0.46 ug/ml(FEU)       RESPIRATORY VIRUS PANEL W/COVID-19, PCR          Collection Time: 05/03/20 10:10 AM       Specimen: Nasopharyngeal         Result  Value  Ref Range            Adenovirus  Not detected  NOTD         Coronavirus 229E  Not detected  NOTD         Coronavirus HKU1  Not detected  NOTD         Coronavirus CVNL63  Not detected  NOTD         Coronavirus OC43  Not detected  NOTD         SARS-CoV-2, PCR  Detected (AA)  NOTD         Metapneumovirus  Not detected  NOTD         Rhinovirus and Enterovirus  Not detected  NOTD  Influenza A  Not detected  NOTD         Influenza B  Not detected  NOTD         Parainfluenza 1  Not detected  NOTD         Parainfluenza 2  Not detected  NOTD         Parainfluenza 3  Not detected  NOTD         Parainfluenza virus 4  Not detected  NOTD         RSV by PCR  Not detected  NOTD         B. parapertussis, PCR  Not detected  NOTD         Bordetella pertussis - PCR  Not detected  NOTD         Chlamydophila pneumoniae DNA, QL, PCR  Not detected  NOTD              Mycoplasma pneumoniae DNA, QL, PCR  Not detected  NOTD          XR CHEST PORT      Result Date: 05/03/2020   Stable enlarged cardiac silhouette. Question hazy basilar opacities. Question small pleural effusion. Findings may reflect developing edema and/or pneumonitis. Correlate clinically. Follow-up can be obtained.        Assessment/Plan:     54 y.o. female  with PMH bioprosthetic mitral valve replacement/Maze procedure/LAA appendage ligation in 2017, Afib(on warfarin bridged with Lovenox), HTN, Smoker, Bipolar 1 disorder/PTSD, stable angina pectoris(s/p LHC) now admitted with acute hypoxic respiratory failure  in the setting of COVID-19.      Acute Hypoxic Respiratory  failure 2/2 COVID-19 infection:    DDx: Covid-19 pneumonia, CHF exacerbation(BNP: 5573), COPD Exacerbation(Pt with no current diagnosis of COPD). Pt present to the ED with history of fever of 101.6 F and shortness of breath at home since  yesterday.  Upon present team to the ED patient's temperature was 100.5.  Patient required 1.5-2 L O2 while in the ED to maintain sats above 92%.  Labs significant for RPR positive for COVID-19, CRP 0.8, D-dimer 0.34, pro-Cal <0.05, ferritin 64,  LD 248.  Labs also significant for WBC 2.7.    -Admit to telemetry   -Vitals per unit routine   -PT/OT/CM   -Daily CBC and BMP   -Daily CRP, D-dimer, ferritin, proCal   -ID consulted (appreciate recommendations)   -Continue dexamethasone IV 6 mg daily   -Follow-up ABG      Afib(on warfarin bridged with Lovenox):   Patient on Lovenox 60 mg subcutaneously twice daily as a bridge to therapeutic INR on warfarin of 2-3 at home. Increased dose to be appropriate for current weight.    -Confirmed Lovenox dosing and will continue Lovenox 90 mg twice daily subcutaneously while patient admitted   -Pharmacy consult placed for dosing of warfarin   -Hold metoprolol succinate 25 mg daily in the setting of soft blood pressures.  (Consider restarting tomorrow)      Bioprosthetic mitral valve replacement/Maze procedure/LAA appendage ligation in 2017/stable angina pectoris(s/p LHC):   LHC significant for Prox RCA lesion 30% stenosed.  Otherwise unremarkable.  BNP 1579, negative high-sensitivity troponin   -Continue home Lasix 40 mg daily PO   -Hold metoprolol succinate 25 mg daily in the setting of soft blood pressures.  (Consider restarting tomorrow)   -Follow-up Results for ECHO   -Follow-up Troponin q6hrs 3 times      Tobacco dependence/questionable COPD:   Patient with 40-pack-year smoking  history.  Current every day smoker.   -Continue home albuterol every 6 hours as needed wheezing and shortness of breath   -Continue home Combivent 4 times daily   -We  will refrain from using nebulizer treatments in the setting of COVID-19 infection      Bipolar 1 disorder/PTSD:   On lamotrigine 75 mg daily at home.   -Continue home lamotrigine 75 mg daily         Diet   Cardiac diet     DVT Prophylaxis   Lovenox 90 mg twice daily     GI Prophylaxis   Protonix 40 mg daily     Code status   full        Disposition  >2MN, telemetry           Point of Contact  Jerrica Bulluck    Relationship: God-Daughter   [phone]           Berlinda Last, MD , PGY-3     [place]     Senior Pager: [phone]       May 03, 2020, 1:14 PM

## 2020-05-03 NOTE — Progress Notes (Signed)
Pharmacist Consult: Warfarin Management    Assessment/Plan  INR is 1.3  Will give warfarin 5 mg as a one-time dose.   Recheck INR daily and adjust dose accordingly. Will continue to follow INR trend and patient's clinical progress daily.     Subjective/Objective  Patient on warfarin therapy for Atrial Fibrillation  Target goal INR of 2 - 3  Patient is has been on warfarin PTA. Patient's home dose is 5 mg MWF, 3.75 mg TTSS,, with last dose taken 12/18.   Patient was here last week for cardiac cath. She was sent home with warfarin + lovenox bridge. Patient INR low at 1.3, compliance vs. Need for increased dose. She has been same dose since ~10/21.   INR was requested STAT ~1400. Not drawn until ~1900.     Date 12/19         INR 1.3         Warfarin Dose (mg) 5             Bleeding/Sensitivity Risk Factors: Hx variable INRs, Recent Trauma or Surgery, and Hypertension  Potentially interacting medications include:   Drugs that may increase INR: None  Drugs that may decrease INR: None  Other anticoagulants include: Enoxaparin    Recent Labs  Coags Lab Results   Component Value Date/Time    PTP 15.7 (H) 05/03/2020 06:50 PM    INR 1.3 (H) 05/03/2020 06:50 PM      CBC Lab Results   Component Value Date/Time    WBC 2.7 (L) 05/03/2020 09:40 AM    HGB 13.9 05/03/2020 09:40 AM    HCT 43.1 05/03/2020 09:40 AM    PLT 200 05/03/2020 09:40 AM      Occult Blood No results found for: OB1, OB2, OB3, OCBL, POCOBL, SOB, OCCBLDEXT       Thank you,  Nicholaus Corolla, [facility]  05/03/2020

## 2020-05-03 NOTE — ED Notes (Signed)
Report called to [place], RN on [address] [facility] [place]

## 2020-05-03 NOTE — Progress Notes (Signed)
Progress Notes by Lisbeth Ply, MD at 05/03/20 2148                Author: Lisbeth Ply, MD  Service: FAMILY MEDICINE  Author Type: Resident       Filed: 05/04/20 0009  Date of Service: 05/03/20 2148  Status: Addendum          Editor: Lisbeth Ply, MD (Resident)       Related Notes: Original Note by Lisbeth Ply, MD (Resident) filed at 05/03/20 2230          Cosigner: Dawayne Patricia, MD at 05/04/20 0805                       Brief PM Progress Note   [facility]           Subjective:         Visited patient at bedside for PM check.  Patient resting in bed with NC in place. Endorses SOB which she says is improving and intermittent HA which responds to Tylenol.  Her only other concerns are when she will get dinner and receive her Lovenox.       ROS: Endorses non-productive cough, but denies lightheadedness, abdominal pain, muscle weakness.        Objective:        Visit Vitals      BP  116/74 (BP 1 Location: Left upper arm, BP Patient Position: At rest)     Pulse  75     Temp  98.4 ??F (36.9 ??C)     Resp  20     Ht  5\' 3"  (1.6 m)     Wt  89.4 kg (197 lb)     SpO2  95%        BMI  34.90 kg/m??           Physical Exam:    General: NAD.   CV:  RRR, no M/G/R.   RESP: Unlabored breathing. Equal expansion bilaterally. Mild wheezing on inspiration in anterior superior lung fields.   MS: Mild diffuse TTP of chest and mid back. No joint deformity or instability. No atrophy.   Ext:   Moving extremities  freely and purposefully. No edema.     Skin: Warm & dry. No rashes, lesions, or ulcers. Good turgor.    Psych: Appropriate mood and affect.           Assessment & Plan:     Patient continues to endorse SOB and cough, but it is improving. Mild TTP of chest and back likely due to continued cough. VSS, but repeat ABG shows hypoxia (pO2 68) despite patient  being on 2L O2 NC according to nurse. O2 increased from 2L to 5L. Confirmed with nurse that O2 was adjusted.      For full assessment  and plan, please see daily progress note.         , MD, PGY-1     [facility]       May 03, 2020, 9:54 PM

## 2020-05-03 NOTE — ED Notes (Signed)
Received patient report from [place], LPN. Several attempts to call report to [facility] made by [facility]. Will attempt to contact floor again to give patient report. Patient resting on stretcher no distress noted.

## 2020-05-03 NOTE — ED Notes (Signed)
Never received call back for report.Tried calling report again at 1845. Was placed on hold for . Could no longer stay at the phone and wait for nurse. Passed along to [employer] shift.

## 2020-05-04 ENCOUNTER — Inpatient Hospital Stay: Admit: 2020-05-04 | Payer: MEDICARE | Primary: Family Medicine

## 2020-05-04 ENCOUNTER — Inpatient Hospital Stay: Payer: MEDICARE | Primary: Family Medicine

## 2020-05-04 LAB — CBC WITH AUTOMATED DIFF
ABS. BASOPHILS: 0 10*3/uL (ref 0.0–0.1)
ABS. EOSINOPHILS: 0 10*3/uL (ref 0.0–0.4)
ABS. IMM. GRANS.: 0 10*3/uL (ref 0.00–0.04)
ABS. LYMPHOCYTES: 1.1 10*3/uL (ref 0.9–3.6)
ABS. MONOCYTES: 0.4 10*3/uL (ref 0.05–1.2)
ABS. NEUTROPHILS: 1.8 10*3/uL (ref 1.8–8.0)
ABSOLUTE NRBC: 0 10*3/uL (ref 0.00–0.01)
BASOPHILS: 0 % (ref 0–2)
EOSINOPHILS: 0 % (ref 0–5)
HCT: 41.5 % (ref 35.0–45.0)
HGB: 13.1 g/dL (ref 12.0–16.0)
IMMATURE GRANULOCYTES: 0 % (ref 0.0–0.5)
LYMPHOCYTES: 32 % (ref 21–52)
MCH: 28.9 PG (ref 24.0–34.0)
MCHC: 31.6 g/dL (ref 31.0–37.0)
MCV: 91.4 FL (ref 78.0–100.0)
MONOCYTES: 11 % — ABNORMAL HIGH (ref 3–10)
MPV: 10.2 FL (ref 9.2–11.8)
NEUTROPHILS: 56 % (ref 40–73)
NRBC: 0 PER 100 WBC
PLATELET: 175 10*3/uL (ref 135–420)
RBC: 4.54 M/uL (ref 4.20–5.30)
RDW: 13.1 % (ref 11.6–14.5)
WBC: 3.2 10*3/uL — ABNORMAL LOW (ref 4.6–13.2)

## 2020-05-04 LAB — METABOLIC PANEL, BASIC
Anion gap: 5 mmol/L (ref 3.0–18)
BUN/Creatinine ratio: 16 (ref 12–20)
BUN: 12 MG/DL (ref 7.0–18)
CO2: 25 mmol/L (ref 21–32)
Calcium: 9 MG/DL (ref 8.5–10.1)
Chloride: 107 mmol/L (ref 100–111)
Creatinine: 0.76 MG/DL (ref 0.6–1.3)
GFR est AA: >60 mL/min/{1.73_m2} (ref >60)
GFR est non-AA: >60 mL/min/{1.73_m2} (ref >60)
Glucose: 88 mg/dL (ref 74–99)
Potassium: 3.3 mmol/L — ABNORMAL LOW (ref 3.5–5.5)
Sodium: 137 mmol/L (ref 136–145)

## 2020-05-04 LAB — CRP, HIGH SENSITIVITY
CRP High Sensitivity: 6 mg/L
CRP, High sensitivity: 6 mg/L

## 2020-05-04 LAB — BLOOD GAS, ARTERIAL POC
Allens test (POC): POSITIVE
BASE DEFICIT (POC): 0.6 mmol/L
Base deficit (POC): 0.6 mmol/L
FIO2 (POC): 32 %
FIO2: 32 %
HCO3 (POC): 24.4 MMOL/L (ref 22–26)
HCO3, Art: 24.4 MMOL/L (ref 22–26)
POC Allen's Test: POSITIVE
POC O2 SAT: 93 % (ref 92–97)
pCO2 (POC): 40.6 MMHG (ref 35.0–45.0)
pCO2, Art: 40.6 MMHG (ref 35.0–45.0)
pH (POC): 7.39 (ref 7.35–7.45)
pH, Art: 7.39 (ref 7.35–7.45)
pO2 (POC): 68 MMHG — ABNORMAL LOW (ref 80–100)
pO2, Art: 68 MMHG — ABNORMAL LOW (ref 80–100)
sO2 (POC): 93 % (ref 92–97)

## 2020-05-04 LAB — D DIMER: D DIMER: 0.45 ug/ml(FEU) (ref <0.46)

## 2020-05-04 LAB — TROPONIN-HIGH SENSITIVITY
Troponin-High Sensitivity: 6 ng/L (ref 0–54)
Troponin-High Sensitivity: 6 ng/L (ref 0–54)

## 2020-05-04 LAB — PTT: aPTT: 46.2 s — ABNORMAL HIGH (ref 23.0–36.4)

## 2020-05-04 LAB — C REACTIVE PROTEIN, QT: C-Reactive protein: <0.3 mg/dL (ref 0–0.3)

## 2020-05-04 LAB — PROTHROMBIN TIME + INR
INR: 1.3 — ABNORMAL HIGH (ref 0.8–1.2)
INR: 1.3 — ABNORMAL HIGH (ref 0.8–1.2)
Prothrombin time: 15.7 s — ABNORMAL HIGH (ref 11.5–15.2)
Prothrombin time: 16.4 s — ABNORMAL HIGH (ref 11.5–15.2)

## 2020-05-04 LAB — FIBRINOGEN
Fibrinogen: 442 mg/dL (ref 210–451)
Fibrinogen: 442 mg/dL (ref 210–451)

## 2020-05-04 LAB — PROCALCITONIN
Procalcitonin: <0.05 ng/mL
Procalcitonin: <0.05 ng/mL

## 2020-05-04 LAB — FERRITIN
Ferritin: 95 NG/ML (ref 8–388)
Ferritin: 95 NG/ML (ref 8–388)

## 2020-05-04 LAB — TRANSTHORACIC ECHOCARDIOGRAM (TTE) LIMITED (CONTRAST/BUBBLE/3D PRN)
AV Area by Peak Velocity: 2.3 cm2
AV Area by Peak Velocity: 2.3 cm2
AV Area by VTI: 2.5 cm2
AV Area by VTI: 2.5 cm2
AV Mean Gradient: 7 mmHg
AV Mean Velocity: 1.3 m/s
AV Peak Gradient: 13 mmHg
AV Peak Velocity: 1.8 m/s
AV VTI: 29.8 cm
AV Velocity Ratio: 0.72
Ao Root Index: 1.25 cm/m2
Aortic Root: 2.4 cm
EF Physician: 50 %
Fractional Shortening 2D: 18 % (ref 28–44)
IVSd: 1.1 cm — AB (ref 0.6–0.9)
LV Mass 2D Index: 107.9 g/m2 — AB (ref 43–95)
LV Mass 2D: 207.1 g — AB (ref 67–162)
LV RWT Ratio: 0.44
LVIDd Index: 2.6 cm/m2
LVIDd: 5 cm (ref 3.9–5.3)
LVIDs Index: 2.14 cm/m2
LVIDs: 4.1 cm
LVOT Area: 3.1 cm2
LVOT Diameter: 2 cm
LVOT Mean Gradient: 3 mmHg
LVOT Peak Gradient: 7 mmHg
LVOT Peak Velocity: 1.3 m/s
LVOT SV: 73.5 ml
LVOT Stroke Volume Index: 38.3 mL/m2
LVOT VTI: 23.4 cm
LVOT:AV VTI Index: 0.79
LVPWd: 1.1 cm — AB (ref 0.6–0.9)
Left Ventricular Ejection Fraction: 50
MV Area by PHT: 1.8 cm2
MV Area by VTI: 1.3 cm2
MV Max Velocity: 2.2 m/s
MV Mean Gradient: 9 mmHg
MV Mean Velocity: 1.4 m/s
MV PHT: 124.3 ms
MV Peak Gradient: 19 mmHg
MV VTI: 58.5 cm
MV:LVOT VTI Index: 2.5
PASP: 27 mmHg
TR Max Velocity: 2.46 m/s
TR Peak Gradient: 24 mmHg

## 2020-05-04 LAB — PROTIME-INR
INR: 1.3 — ABNORMAL HIGH (ref 0.8–1.2)
INR: 1.3 — ABNORMAL HIGH (ref 0.8–1.2)
Protime: 15.7 s — ABNORMAL HIGH (ref 11.5–15.2)
Protime: 16.4 s — ABNORMAL HIGH (ref 11.5–15.2)

## 2020-05-04 LAB — CBC WITH AUTO DIFFERENTIAL
Basophils %: 0 % (ref 0–2)
Basophils Absolute: 0 10*3/uL (ref 0.0–0.1)
Eosinophils %: 0 % (ref 0–5)
Eosinophils Absolute: 0 10*3/uL (ref 0.0–0.4)
Granulocyte Absolute Count: 0 10*3/uL (ref 0.00–0.04)
Hematocrit: 41.5 % (ref 35.0–45.0)
Hemoglobin: 13.1 g/dL (ref 12.0–16.0)
Immature Granulocytes: 0 % (ref 0.0–0.5)
Lymphocytes %: 32 % (ref 21–52)
Lymphocytes Absolute: 1.1 10*3/uL (ref 0.9–3.6)
MCH: 28.9 PG (ref 24.0–34.0)
MCHC: 31.6 g/dL (ref 31.0–37.0)
MCV: 91.4 FL (ref 78.0–100.0)
MPV: 10.2 FL (ref 9.2–11.8)
Monocytes %: 11 % — ABNORMAL HIGH (ref 3–10)
Monocytes Absolute: 0.4 10*3/uL (ref 0.05–1.2)
NRBC Absolute: 0 10*3/uL (ref 0.00–0.01)
Neutrophils %: 56 % (ref 40–73)
Neutrophils Absolute: 1.8 10*3/uL (ref 1.8–8.0)
Nucleated RBCs: 0 PER 100 WBC
Platelets: 175 10*3/uL (ref 135–420)
RBC: 4.54 M/uL (ref 4.20–5.30)
RDW: 13.1 % (ref 11.6–14.5)
WBC: 3.2 10*3/uL — ABNORMAL LOW (ref 4.6–13.2)

## 2020-05-04 LAB — C-REACTIVE PROTEIN: CRP: <0.3 mg/dL (ref 0–0.3)

## 2020-05-04 LAB — BASIC METABOLIC PANEL
Anion Gap: 5 mmol/L (ref 3.0–18)
BUN: 12 MG/DL (ref 7.0–18)
Bun/Cre Ratio: 16 (ref 12–20)
CO2: 25 mmol/L (ref 21–32)
Calcium: 9 MG/DL (ref 8.5–10.1)
Chloride: 107 mmol/L (ref 100–111)
Creatinine: 0.76 MG/DL (ref 0.6–1.3)
EGFR IF NonAfrican American: >60 mL/min/{1.73_m2} (ref >60)
GFR African American: >60 mL/min/{1.73_m2} (ref >60)
Glucose: 88 mg/dL (ref 74–99)
Potassium: 3.3 mmol/L — ABNORMAL LOW (ref 3.5–5.5)
Sodium: 137 mmol/L (ref 136–145)

## 2020-05-04 LAB — TROPONIN, HIGH SENSITIVITY
Troponin, High Sensitivity: 6 ng/L (ref 0–54)
Troponin, High Sensitivity: 6 ng/L (ref 0–54)

## 2020-05-04 LAB — D-DIMER, QUANTITATIVE: D-Dimer, Quant: 0.45 ug/ml(FEU) (ref <0.46)

## 2020-05-04 LAB — APTT: aPTT: 46.2 s — ABNORMAL HIGH (ref 23.0–36.4)

## 2020-05-04 MED ORDER — WARFARIN 5 MG TAB
5 mg | Freq: Once | ORAL | Status: AC
Start: 2020-05-04 — End: 2020-05-04
  Administered 2020-05-04: 23:00:00 via ORAL

## 2020-05-04 MED ORDER — ACETAMINOPHEN 500 MG TAB
500 mg | Freq: Three times a day (TID) | ORAL | Status: DC
Start: 2020-05-04 — End: 2020-05-15
  Administered 2020-05-05 – 2020-05-15 (×33): via ORAL

## 2020-05-04 MED ORDER — POTASSIUM CHLORIDE SR 20 MEQ TAB, PARTICLES/CRYSTALS
20 mEq | ORAL | Status: AC
Start: 2020-05-04 — End: 2020-05-04
  Administered 2020-05-04 (×3): via ORAL

## 2020-05-04 MED ORDER — WARFARIN 5 MG TAB
5 mg | ORAL | Status: AC
Start: 2020-05-04 — End: 2020-05-03
  Administered 2020-05-04: 04:00:00 via ORAL

## 2020-05-04 MED ORDER — LAMOTRIGINE 25 MG TAB
25 mg | Freq: Every day | ORAL | Status: DC
Start: 2020-05-04 — End: 2020-05-15
  Administered 2020-05-04 – 2020-05-15 (×11): via ORAL

## 2020-05-04 MED ORDER — TRAMADOL 50 MG TAB
50 mg | ORAL | Status: AC
Start: 2020-05-04 — End: 2020-05-04
  Administered 2020-05-04: 23:00:00 via ORAL

## 2020-05-04 MED ORDER — PERFLUTREN LIPID MICROSPHERES 1.1 MG/ML IV
1.1 mg/mL | Freq: Once | INTRAVENOUS | Status: AC
Start: 2020-05-04 — End: 2020-05-04
  Administered 2020-05-04: 21:00:00 via INTRAVENOUS

## 2020-05-04 MED ORDER — ENOXAPARIN 100 MG/ML SUB-Q SYRINGE
100 mg/mL | Freq: Two times a day (BID) | SUBCUTANEOUS | Status: DC
Start: 2020-05-04 — End: 2020-05-09
  Administered 2020-05-04 – 2020-05-08 (×8): via SUBCUTANEOUS

## 2020-05-04 MED FILL — COMBIVENT RESPIMAT 20 MCG-100 MCG/ACTUATION SOLUTION FOR INHALATION: 20-100 mcg/actuation | RESPIRATORY_TRACT | Qty: 0.01

## 2020-05-04 MED FILL — PRAVASTATIN 20 MG TAB: 20 mg | ORAL | Qty: 2

## 2020-05-04 MED FILL — COMBIVENT RESPIMAT 20 MCG-100 MCG/ACTUATION SOLUTION FOR INHALATION: 20-100 mcg/actuation | RESPIRATORY_TRACT | Qty: 1

## 2020-05-04 MED FILL — TRAMADOL 50 MG TAB: 50 mg | ORAL | Qty: 1

## 2020-05-04 MED FILL — CETIRIZINE 10 MG TAB: 10 mg | ORAL | Qty: 1

## 2020-05-04 MED FILL — LAMOTRIGINE 25 MG TAB: 25 mg | ORAL | Qty: 2

## 2020-05-04 MED FILL — KLOR-CON M20 MEQ TABLET,EXTENDED RELEASE: 20 mEq | ORAL | Qty: 1

## 2020-05-04 MED FILL — ALBUTEROL SULFATE HFA 90 MCG/ACTUATION AEROSOL INHALER: 90 mcg/actuation | RESPIRATORY_TRACT | Qty: 0

## 2020-05-04 MED FILL — LAMOTRIGINE 25 MG TAB: 25 mg | ORAL | Qty: 3

## 2020-05-04 MED FILL — PREGABALIN 50 MG CAP: 50 mg | ORAL | Qty: 1

## 2020-05-04 MED FILL — DEFINITY 1.1 MG/ML INTRAVENOUS SUSPENSION: 1.1 mg/mL | INTRAVENOUS | Qty: 2

## 2020-05-04 MED FILL — DEXAMETHASONE SODIUM PHOSPHATE 4 MG/ML IJ SOLN: 4 mg/mL | INTRAMUSCULAR | Qty: 2

## 2020-05-04 MED FILL — METOPROLOL SUCCINATE SR 25 MG 24 HR TAB: 25 mg | ORAL | Qty: 1

## 2020-05-04 MED FILL — ENOXAPARIN 100 MG/ML SUB-Q SYRINGE: 100 mg/mL | SUBCUTANEOUS | Qty: 1

## 2020-05-04 MED FILL — ACETAMINOPHEN 500 MG TAB: 500 mg | ORAL | Qty: 2

## 2020-05-04 MED FILL — ALBUTEROL SULFATE HFA 90 MCG/ACTUATION AEROSOL INHALER: 90 mcg/actuation | RESPIRATORY_TRACT | Qty: 8.5

## 2020-05-04 MED FILL — FUROSEMIDE 40 MG TAB: 40 mg | ORAL | Qty: 1

## 2020-05-04 MED FILL — FLUTICASONE 50 MCG/ACTUATION NASAL SPRAY, SUSP: 50 mcg/actuation | NASAL | Qty: 16

## 2020-05-04 MED FILL — PANTOPRAZOLE 40 MG TAB, DELAYED RELEASE: 40 mg | ORAL | Qty: 1

## 2020-05-04 MED FILL — WARFARIN 5 MG TAB: 5 mg | ORAL | Qty: 1

## 2020-05-04 MED FILL — CHOLECALCIFEROL (VITAMIN D3) 1,000 UNIT (25 MCG) TAB: ORAL | Qty: 2

## 2020-05-04 NOTE — Progress Notes (Signed)
 Chaplain conducted an initial consultation and Spiritual Assessment for Mary [name], who is a 54 y.o.,female. Patient's Primary Language is: [place].   According to the patient's EMR Religious Affiliation is: No preference.     The reason the Patient came to the hospital is:   Patient Active Problem List    Diagnosis Date Noted   . Respiratory failure (HCC) 05/03/2020   . COVID-19 05/03/2020   . Mitral regurgitation 04/23/2020        The Chaplain provided the following Interventions:  Initiated a relationship of care and support.   Chart reviewed.    Assessment:  Not able to assess the patient due to medical condition. No family at bedside.    Plan:  Chaplains will continue to follow and will provide pastoral care on an as needed/requested basis.  Chaplain recommends bedside caregivers page chaplain on duty if patient shows signs of acute spiritual or emotional distress.    Chaplain Cherene Rake  Spiritual Care  [phone])

## 2020-05-04 NOTE — Progress Notes (Signed)
Pharmacist Consult: Warfarin Management    Assessment/Plan  INR is subtherapeutic: 1.3  Will give warfarin 5 mg as a one-time dose.   Recheck INR daily and adjust dose accordingly. Will continue to follow INR trend and patient's clinical progress daily.     Subjective/Objective  Patient on warfarin therapy for Atrial Fibrillation.   Target goal INR of 2 - 3  Patient is has been on warfarin PTA. Patient's home dose is 5 mg MWF, 3.75 mg TTSS.   Patient was here last week for cardiac cath. She was sent home with warfarin + lovenox bridge. Patient INR low at 1.3, compliance vs. Need for increased dose. She has been same dose since ~10/21.     Date 12/19 12/20        INR 1.3 1.3        Warfarin Dose (mg) 5 mg 5 mg            Bleeding/Sensitivity Risk Factors: Hx variable INRs, Recent Trauma or Surgery, and Hypertension  Potentially interacting medications include:   Drugs that may increase INR: None  Drugs that may decrease INR: None  Other anticoagulants include: Enoxaparin    Thank you,  Maeola Sarah, [facility]  05/04/2020

## 2020-05-04 NOTE — Progress Notes (Signed)
 Problem: Self Care Deficits Care Plan (Adult)  Goal: *Acute Goals and Plan of Care (Insert Text)  Outcome: Resolved/Met   OCCUPATIONAL THERAPY EVALUATION/DISCHARGE    Patient: Mary [name] (54 y.o. female)  Date: 05/04/2020  Primary Diagnosis: Respiratory failure (HCC) [J96.90]  COVID-19 [U07.1]  Pneumonia due to COVID-19 virus [U07.1, J12.82]        Precautions:   Fall,Contact,Other (comment) (droplet+)  PLOF: .independent with ADLs and functional mobility, prescription for [facility] and has not yet picked up    ASSESSMENT AND RECOMMENDATIONS:  Nursing/RN cleared for pt to participate in OT evaluation and tx session. Patient presents lying supine in bed, A & O x 4, no c/o pain. Bed mobility: Mod I supine -> sit edge of bed. LB dress: Mod I doff and don slipper sock seated edge of bed w/ Good sitting balance using crossing leg method. UB bathe and dress with Mod I seated on edge of bed. Simulated toilet transfer: occasional hand held assist to simulate SPC with Mod I, pt declined need to toilet. Pt seated in recliner chair at end of tx session, Patient verbalized understanding to utilize call bell to request assist as needed. Patient presents at close at baseline as PLOF with ADLs and functional mobility. Patient verbalized understanding of skilled OT services not indicated at this time while in acute hospital. Pt reports chronic arthritis and pain with movement with BUEs/shoulders with hx of multiple cervical sx's and chronic back pain, BUE AROM WFL, MMT grossly 3/5.     Skilled occupational therapy is not indicated at this time.  Discharge Recommendations: Home Health for home safety assessment  Further Equipment Recommendations for Discharge: shower chair for energy conservation     SUBJECTIVE:   Patient stated "I don't know how I got Covid, I'm the only one who is careful to stay home."    OBJECTIVE DATA SUMMARY:     Past Medical History:   Diagnosis Date    Anxiety     Asthma     Bipolar 1 disorder (HCC)     CHF  (congestive heart failure) (HCC)     COPD (chronic obstructive pulmonary disease) (HCC)     H/O aortic valve replacement     PTSD (post-traumatic stress disorder)      Past Surgical History:   Procedure Laterality Date    HX HEART CATHETERIZATION      HX HEART VALVE SURGERY       Barriers to Learning/Limitations: None  Compensate with: visual, verbal, tactile, kinesthetic cues/model    Home Situation:   Home Situation  Home Environment: Private residence  # Steps to Enter: 2  Rails to Enter: Yes  One/Two Story Residence: One story  Living Alone: No  Support Systems: Other Family Member(s)  Patient Expects to be Discharged to:: House  Current DME Used/Available at Home: Grab bars  Tub or Shower Type: Shower  [x]      Right hand dominant   []      Left hand dominant    Cognitive/Behavioral Status:  Neurologic State: Alert  Orientation Level: Oriented X4  Cognition: Follows commands  Safety/Judgement: Fall prevention    Skin: appears intact  Edema: none noted    Vision/Perceptual:  appears intact, able to tell correct time on wall clock       Coordination: BUE  Coordination: Within functional limits  Fine Motor Skills-Upper: Left Intact;Right Intact    Gross Motor Skills-Upper: Left Intact;Right Intact    Balance:  Sitting: Intact  Standing: Impaired  Standing - Static: Good  Standing - Dynamic : Good    Strength: BUE  Strength: Within functional limits (grossly 3/5)     Tone & Sensation: BUE  Tone: Normal  Sensation: Intact     Range of Motion: BUE  AROM: Within functional limits     Functional Mobility and Transfers for ADLs:  Bed Mobility:     Supine to Sit: Modified independent  Sit to Supine: Modified independent     Transfers:  Sit to Stand: Modified independent  Stand to Sit: Modified independent    ADL Assessment:  Feeding: Modified independent    Oral Facial Hygiene/Grooming: Modified Independent    Bathing: Modified independent    Upper Body Dressing: Modified independent    Lower Body Dressing: Modified  independent    Toileting: Modified independent     ADL Intervention:  Grooming  Position Performed: Seated edge of bed  Washing Face: Modified independent  Brushing/Combing Hair: Modified independent    Upper Body Bathing  Bathing Assistance: Modified independent  Position Performed: Seated edge of bed    Lower Body Bathing  Bathing Assistance:  (pt declined, request to perform later in day)    Upper Body Dressing Assistance  Shirt simulation with hospital gown: Modified independent    Lower Body Dressing Assistance  Socks: Modified independent  Leg Crossed Method Used: Yes  Position Performed: Seated edge of bed  Cognitive Retraining  Safety/Judgement: Fall prevention    Pain:  Pain level pre-treatment: 0/10   Pain level post-treatment: 0/10     Activity Tolerance:   fair  Please refer to the flowsheet for vital signs taken during this treatment.  After treatment:   [x]   Patient left in no apparent distress sitting up in chair  []   Patient left in no apparent distress in bed  [x]   Call bell left within reach  [x]   Nursing notified  []   Caregiver present  []   Bed alarm activated    COMMUNICATION/EDUCATION:   [x]       Role of Occupational Therapy in the acute care setting  [x]       Home safety education was provided and the patient/caregiver indicated understanding.  [x]       Patient/family have participated as able and agree with findings and recommendations.  []       Patient is unable to participate in plan of care at this time.    Thank you for this referral.  Abigail BRAVO Lausier  Time Calculation: 26 mins      Eval Complexity: History: MEDIUM Complexity : Expanded review of history including physical, cognitive and psychosocial  history ;   Examination: MEDIUM Complexity : 3-5 performance deficits relating to physical, cognitive , or psychosocial skils that result in activity limitations and / or participation restrictions;   Decision Making:MEDIUM Complexity : Patient may present with comorbidities that affect  occupational performnce. Miniml to moderate modification of tasks or assistance (eg, physical or verbal ) with assesment(s) is necessary to enable patient to complete evaluation

## 2020-05-04 NOTE — Progress Notes (Signed)
Progress Notes by Angus Seller, DO at 05/04/20 1950                Author: Angus Seller, DO  Service: FAMILY MEDICINE  Author Type: Resident       Filed: 05/04/20 0729  Date of Service: 05/04/20 0637  Status: Attested           Editor: Angus Seller, DO (Resident)  Cosigner: Izora Ribas, MD at 05/04/20 1501          Attestation signed by Izora Ribas, MD at 05/04/20 1501                 I have seen and examined the patient. I reviewed the resident's note and agree with below.     Feels better. Little cough with scant sputum.   On anticoagulant for a. Fib      Izora Ribas, MD   05/04/2020, 3:00 PM                                       Intern Progress Note   [facility]              Patient: Mary [name]  MRN: [id number]   CSN: [id number]          Date of Birth: Feb 14, 1966   Age: 54 y.o.   Sex: female          DOA: 05/03/2020  LOS:  LOS: 1 day                          Subjective:            Acute events: NC increased to 5L overnight after ABG.        Patient seen and examined this morning.  No complaints, states feeling better since admission since yesterday.  She is concerned about her medications which we reviewed.  She is still coughing but feels more comfortable on 5LNC.  She wishes to know how  long she will be in the hospital.  ID to see today.        Review of Systems    Constitutional: Negative for chills, fever and malaise/fatigue.    Respiratory: Positive for cough, shortness of breath  and wheezing. Negative for hemoptysis and sputum production.     Cardiovascular: Negative for chest pain, palpitations, orthopnea and leg swelling.    Gastrointestinal: Negative for abdominal pain, constipation, diarrhea, heartburn, nausea and vomiting.    Musculoskeletal: Negative for myalgias.    Skin: Negative for rash.    Neurological: Negative for headaches.            Objective:        Patient Vitals for the past 12 hrs:            Temp  Pulse  Resp  BP   SpO2            05/04/20 0418  98.3 ??F (36.8 ??C)  72  18  128/74  96 %            05/04/20 0002  98.8 ??F (37.1 ??C)  79  20  127/82  95 %     05/03/20 2041  98.4 ??F (36.9 ??C)  75  20  116/74  95 %     05/03/20 1945  99 ??F (37.2 ??C)  74  20  106/61  93 %            05/03/20 1845  --  81  --  107/68  92 %            No intake or output data in the 24 hours ending 05/04/20 5638      Physical Exam:    General: well-appearing, NAD, AOx3, NC in place    CV:  RRR, no M/G/R   RESP: Unlabored breathing. Bilaterally wheezing and rhonchi. Equal expansion bilaterally.    ABD:  Soft, nontender, nondistended. No hepatosplenomegaly.    MS:  No joint deformity or instability. No atrophy.   Ext:  No edema.  2+ radial and dp pulses bilaterally.   Skin: Warm & dry. No rashes, lesions, or ulcers.  Good turgor.    Psych: normal mood and affect       Lab/Data Reviewed:     Recent Results (from the past 24 hour(s))     EKG, 12 LEAD, INITIAL          Collection Time: 05/03/20  9:29 AM         Result  Value  Ref Range            Ventricular Rate  88  BPM       Atrial Rate  88  BPM       P-R Interval  140  ms       QRS Duration  90  ms       Q-T Interval  354  ms       QTC Calculation (Bezet)  428  ms       Calculated P Axis  95  degrees       Calculated R Axis  45  degrees       Calculated T Axis  145  degrees       Diagnosis                 Normal sinus rhythm   ST & T wave abnormality, consider inferior ischemia   ST & T wave abnormality, consider anterolateral ischemia   Abnormal ECG   When compared with ECG of 05-Apr-2020 12:40,   T wave inversion now evident in Lateral leads   Confirmed by Gerre Scull, MD, ----- ([id number]) on 05/03/2020 10:52:41 AM          POC LACTIC ACID          Collection Time: 05/03/20  9:38 AM         Result  Value  Ref Range            Lactic Acid (POC)  0.65  0.40 - 2.00 mmol/L       CBC WITH AUTOMATED DIFF          Collection Time: 05/03/20  9:40 AM         Result  Value  Ref Range            WBC  2.7 (L)  4.6  - 13.2 K/uL       RBC  4.75  4.20 - 5.30 M/uL       HGB  13.9  12.0 - 16.0 g/dL       HCT  43.1  35.0 - 45.0 %       MCV  90.7  78.0 - 100.0 FL       MCH  29.3  24.0 - 34.0 PG  MCHC  32.3  31.0 - 37.0 g/dL       RDW  13.2  11.6 - 14.5 %       PLATELET  200  135 - 420 K/uL       MPV  10.1  9.2 - 11.8 FL       NRBC  0.0  0 PER 100 WBC       ABSOLUTE NRBC  0.00  0.00 - 0.01 K/uL       NEUTROPHILS  67  40 - 73 %       LYMPHOCYTES  16 (L)  21 - 52 %       MONOCYTES  15 (H)  3 - 10 %       EOSINOPHILS  0  0 - 5 %       BASOPHILS  1  0 - 2 %       IMMATURE GRANULOCYTES  0  0.0 - 0.5 %       ABS. NEUTROPHILS  1.8  1.8 - 8.0 K/UL       ABS. LYMPHOCYTES  0.4 (L)  0.9 - 3.6 K/UL       ABS. MONOCYTES  0.4  0.05 - 1.2 K/UL       ABS. EOSINOPHILS  0.0  0.0 - 0.4 K/UL       ABS. BASOPHILS  0.0  0.0 - 0.1 K/UL       ABS. IMM. GRANS.  0.0  0.00 - 0.04 K/UL       DF  AUTOMATED          METABOLIC PANEL, COMPREHENSIVE          Collection Time: 05/03/20  9:40 AM         Result  Value  Ref Range            Sodium  140  136 - 145 mmol/L       Potassium  3.6  3.5 - 5.5 mmol/L       Chloride  103  100 - 111 mmol/L       CO2  27  21 - 32 mmol/L       Anion gap  10  3.0 - 18 mmol/L       Glucose  98  74 - 99 mg/dL       BUN  8  7.0 - 18 MG/DL       Creatinine  0.99  0.6 - 1.3 MG/DL       BUN/Creatinine ratio  8 (L)  12 - 20         GFR est AA  >60  >60 ml/min/1.46m       GFR est non-AA  59 (L)  >60 ml/min/1.733m      Calcium  9.2  8.5 - 10.1 MG/DL       Bilirubin, total  0.6  0.2 - 1.0 MG/DL       ALT (SGPT)  32  13 - 56 U/L       AST (SGOT)  30  10 - 38 U/L       Alk. phosphatase  55  45 - 117 U/L       Protein, total  7.3  6.4 - 8.2 g/dL       Albumin  4.2  3.4 - 5.0 g/dL       Globulin  3.1  2.0 - 4.0 g/dL       A-G Ratio  1.4  0.8 - 1.7         NT-PRO BNP          Collection Time: 05/03/20  9:40 AM         Result  Value  Ref Range            NT pro-BNP  1,579 (H)  0 - 900 PG/ML       TROPONIN-HIGH SENSITIVITY          Collection  Time: 05/03/20  9:40 AM         Result  Value  Ref Range            Troponin-High Sensitivity  8  0 - 54 ng/L       CRP, HIGH SENSITIVITY          Collection Time: 05/03/20  9:40 AM         Result  Value  Ref Range            CRP, High sensitivity  6.0  mg/L       PROCALCITONIN          Collection Time: 05/03/20  9:40 AM         Result  Value  Ref Range            Procalcitonin  <0.05  ng/mL       D DIMER          Collection Time: 05/03/20  9:40 AM         Result  Value  Ref Range            D DIMER  0.34  <0.46 ug/ml(FEU)       FERRITIN          Collection Time: 05/03/20  9:40 AM         Result  Value  Ref Range            Ferritin  64  8 - 388 NG/ML       LD          Collection Time: 05/03/20  9:40 AM         Result  Value  Ref Range            LD  248 (H)  81 - 234 U/L       C REACTIVE PROTEIN, QT          Collection Time: 05/03/20  9:40 AM         Result  Value  Ref Range            C-Reactive protein  0.8 (H)  0 - 0.3 mg/dL       RESPIRATORY VIRUS PANEL W/COVID-19, PCR          Collection Time: 05/03/20 10:10 AM       Specimen: Nasopharyngeal         Result  Value  Ref Range            Adenovirus  Not detected  NOTD         Coronavirus 229E  Not detected  NOTD         Coronavirus HKU1  Not detected  NOTD         Coronavirus CVNL63  Not detected  NOTD         Coronavirus OC43  Not detected  NOTD         SARS-CoV-2, PCR  Detected (AA)  NOTD  Metapneumovirus  Not detected  NOTD         Rhinovirus and Enterovirus  Not detected  NOTD         Influenza A  Not detected  NOTD         Influenza B  Not detected  NOTD         Parainfluenza 1  Not detected  NOTD         Parainfluenza 2  Not detected  NOTD         Parainfluenza 3  Not detected  NOTD         Parainfluenza virus 4  Not detected  NOTD         RSV by PCR  Not detected  NOTD         B. parapertussis, PCR  Not detected  NOTD         Bordetella pertussis - PCR  Not detected  NOTD         Chlamydophila pneumoniae DNA, QL, PCR  Not detected  NOTD          Mycoplasma pneumoniae DNA, QL, PCR  Not detected  NOTD         BLOOD GAS, ARTERIAL POC          Collection Time: 05/03/20  5:03 PM         Result  Value  Ref Range            Device:  NASAL CANNULA          FIO2 (POC)  2  %       pH (POC)  7.41  7.35 - 7.45         pCO2 (POC)  40.3  35.0 - 45.0 MMHG       pO2 (POC)  67 (L)  80 - 100 MMHG       HCO3 (POC)  25.4  22 - 26 MMOL/L       sO2 (POC)  93.0  92 - 97 %       Base excess (POC)  0.7  mmol/L       Allens test (POC)  Positive          Total resp. rate  22          Site  RIGHT RADIAL          Specimen type (POC)  ARTERIAL          Performed by  Midge Minium         PROTHROMBIN TIME + INR          Collection Time: 05/03/20  6:50 PM         Result  Value  Ref Range            Prothrombin time  15.7 (H)  11.5 - 15.2 sec       INR  1.3 (H)  0.8 - 1.2         BLOOD GAS, ARTERIAL POC          Collection Time: 05/03/20  9:26 PM         Result  Value  Ref Range            Device:  NASAL CANNULA          FIO2 (POC)  32  %       pH (POC)  7.39  7.35 - 7.45         pCO2 (POC)  40.6  35.0 - 45.0 MMHG       pO2 (POC)  68 (L)  80 - 100 MMHG       HCO3 (POC)  24.4  22 - 26 MMOL/L       sO2 (POC)  93.0  92 - 97 %       Base deficit (POC)  0.6  mmol/L       Allens test (POC)  Positive          Site  RIGHT BRACHIAL          Specimen type (POC)  ARTERIAL          Performed by  Milon Score         TROPONIN-HIGH SENSITIVITY          Collection Time: 05/03/20  9:44 PM         Result  Value  Ref Range            Troponin-High Sensitivity  6  0 - 54 ng/L             Assessment & Plan:        This is a 53 y.o. 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  Overnight, her oxygen was increased to [facility] but this morning she is feeling improved from  admission.     ??   1. Acute Hypoxic Respiratory failure 2/2 COVID-19 infection:    Oxygen increased overnight after hypoxia on ABG, feeling improved and comfortable on 5LNC this morning.  Awaiting ID recommendations this morning.      - Supplemental oxygen as needed, wean as tolerated    - PT/OT/CM    - Infectious disease consulted, appreciate recommendations     - Trend CBC, BMP, CRP, D-dimer, Ferritin, Procal    - Continue dexamethasone IV 6 mg daily    - Contact precautions     - Continue home inhalers at this time    ??   2. pAfib currently NSR    - Continue Lovenox 72mg BID until INR therapeutic     - Pharmacy recommendations reviewed, appreciate assistance     - Restart Metoprolol succinate 25 mg daily    ??   3. Bioprosthetic mitral valve replacement/Maze procedure/LAA appendage ligation in 2017/stable angina pectoris(s/p LHC):   LHC significant for Prox RCA lesion 30% stenosed.  Otherwise unremarkable.  BNP 1579, negative high-sensitivity troponin x 2    - Continue Lasix 40 mg daily    - Follow-up results for TTE    - Follow Troponin q6hrs 3 times   ??   4. Tobacco dependence/questionable COPD:   Patient with 40-pack-year smoking history.  Current every day smoker.    - Continue home inhalers    - No nebulizer treatments in the setting of COVID-19 infection    - Nicotine patch if needed   ??   5. Bipolar 1 disorder/PTSD:   Written for Lamotrigine 75 mg daily by Psychiatrist but patient states taking 566mat 7586ms too high, causes sedation and confusion    - Continue Lamotrigine 60m46mily   ??      Diet   Cardiac diet     DVT Prophylaxis   Lovenox 90 mg twice daily     GI Prophylaxis   Protonix 40 mg daily  Code status   Full     Disposition  >2MN, telemetry     ??      Point of Contact  Jerrica Bulluck    Relationship: God-Daughter   ([phone]) [place], DO , PGY-1     [facility]     May 04, 2020, 6:38 AM

## 2020-05-04 NOTE — Progress Notes (Signed)
 Reason for Admission:  Respiratory failure (HCC) [J96.90]  COVID-19 [U07.1]  Pneumonia due to COVID-19 virus [U07.1, J12.82]                 RUR Score:    12%            Plan for utilizing home health:   No, not at this time.                      Likelihood of Readmission:   LOW                         Transition of Care Plan:              Initial assessment completed with patient. Cognitive status of patient: oriented to time, place, person and situation.     Face sheet information confirmed:  yes.  The patient designates Jurrica, her goddaughter to participate in her discharge plan and to receive any needed information. This patient lives in a single family home with her goddaughter.  Patient is able to navigate steps as needed.  Prior to hospitalization, patient was considered to be independent with ADLs/IADLS : yes .   Patient has a current ACP document on file: no      Healthcare Decision Maker:     Click here to complete Clinical research associate of the Healthcare Decision Maker Relationship (ie Primary)    The patient will be available to transport patient home upon discharge.   The patient denies any DMEs at home.      Patient is not currently active with home health.  Patient has not stayed in a skilled nursing facility or rehab.    This patient is on dialysis :no     Currently, the discharge plan is Home.    The patient states that she can obtain her medications from the pharmacy, and take her medications as directed.    Patient's current insurance is Medicare and Medicaid.       Care Management Interventions  PCP Verified by CM: Yes  Mode of Transport at Discharge: Self  Transition of Care Consult (CM Consult): Discharge Planning  Support Systems: Other Family Member(s)        Meade Code, RN Case Management

## 2020-05-04 NOTE — Progress Notes (Signed)
Received report on pt.from off going RN.  Resting quietly in bed on rounds. Denies c/o pain or SOB at this time. HOB elevated. Call bell at side.  No acute distress noted. Will cont to monitor for any changes in status.

## 2020-05-04 NOTE — Consults (Signed)
Infectious Disease Consultation Note        Reason: COVID-19 pneumonia    Current abx Prior abx         Lines:       Assessment :  54 y.o. 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    Malaise for about one week  S/p cardiac cath about a week ago    Clinical presentation consistent with acute hypoxia-present on admission due to COVID-19 pneumonia     ABG reviewed.  PO2-68.  Persistent hypoxia likely due to covid-19 infection superimposed on underlying emphysema    Patient seems to be clinically responding to current dose of steroids.    Oxygen saturation 99% on pulse ox on 5 L.  It remained 99% when I titrated oxygen down to 3 L.    Low procalcitonin argues against superimposed bacterial infection      Recommendations:    1.  Recommend Decadron 6 mg every 24 hours IV  2.  Titrate oxygen as tolerated  3.  Monitor inflammatory markers, oxygenation  4.  Continue diuretics per primary team    Thank you for consultation request. Above plan was discussed in details with patient, and dr Christophe Louis . Please call me if any further questions or concerns. Will continue to participate in the care of this patient.  HPI:    54 y.o. female with bioprosthetic mitral valve replacement/Maze procedure/LAA appendage ligation in 2017, Afib(on warfarin bridged with Lovenox), HTN, Smoker, Bipolar 1 disorder/PTSD, stable angina pectoris(s/p LHC) now admitted to [facility] on 05/03/20 with acute hypoxic respiratory failure in the setting of COVID-19.  ??  Patient states that she was having chest discomfort about a week ago.  She subsequently underwent cardiac cath a week ago.  She has not been feeling good subsequently.   On 05/02/2020, she noted a fever of 101.64F yesterday.  Patient also stated that she  used her albuterol multiple times due to shortness of breath.    She subsequently came to  emergency room on 12/19 for further evaluation.  Here she was noted to be hypoxic and was placed on supplemental oxygen.  She was initially placed on about 1.5 L of oxygen.  Subsequently her FiO2 was increased to 3 L.  I was consulted for further recommendations.  She was started on IV Decadron.  I recommended to obtain inflammatory markers.  Patient had an ABG which revealed persistent hypoxia with PO2 of 68.  Hence that FiO2 was increased to 5 L overnight.  I am seeing patient for further recommendations.    Patient states that she feels better today compared to yesterday.  She is wondering when she can go home.  Denies significant cough/sputum production.  Denies pleuritic chest pain, abdominal pain, diarrhea, nausea.    Past Medical History:   Diagnosis Date   ??? Anxiety    ??? Asthma    ??? Bipolar 1 disorder (HCC)    ??? CHF (congestive heart failure) (HCC)    ??? COPD (chronic obstructive pulmonary disease) (HCC)    ??? H/O aortic valve replacement    ??? PTSD (post-traumatic stress disorder)        Past Surgical History:   Procedure Laterality Date   ??? HX HEART CATHETERIZATION     ??? HX HEART VALVE SURGERY  home Medication List    Details   hydrOXYzine HCL (ATARAX) 25 mg tablet Take  by mouth nightly as needed.      enoxaparin (Lovenox) 40 mg/0.4 mL 60 mg by SubCUTAneous route every twelve (12) hours every twelve (12) hours.      cyclobenzaprine (FLEXERIL) 5 mg tablet Take 5 mg by mouth nightly as needed for Muscle Spasm(s).      acetaminophen (Tylenol Extra Strength) 500 mg tablet Take 1,000 mg by mouth every eight (8) hours as needed for Pain.      pregabalin (LYRICA) 200 mg capsule take 1 capsule by mouth three times a day  Qty: 90 Capsule, Refills: 1    Associated Diagnoses: Neck pain; Chronic pain syndrome      albuterol (PROVENTIL HFA, VENTOLIN HFA, PROAIR HFA) 90 mcg/actuation inhaler Take 2 Puffs by inhalation every  six (6) hours as needed.      cetirizine (ZYRTEC) 10 mg tablet Take 10 mg by mouth daily.      fluticasone propionate (FLONASE) 50 mcg/actuation nasal spray 1 Spray by Nasal route daily.      furosemide (LASIX) 40 mg tablet TAKE ONE TABLET BY MOUTH EVERY DAY      Combivent Respimat 20-100 mcg/actuation inhaler INHALE 1 PUFF BY MOUTH INTO LUNGS EVERY 6 HOURS      lamoTRIgine (LaMICtal) 25 mg tablet TAKE 3 TABLETS BY MOUTH ONCE DAILY      metoprolol succinate (TOPROL-XL) 25 mg XL tablet       omeprazole (PRILOSEC) 40 mg capsule Take 40 mg by mouth daily.      pravastatin (PRAVACHOL) 40 mg tablet Take 40 mg by mouth nightly.        warfarin (COUMADIN) 2.5 mg tablet TAKE 2 TABLETS BY MOUTH ONCE DAILY ON MONDAY WEDNESDAY FRIDAY AND TAKE 1 & 1 2 (ONE AND ONE HALF) ALL OTHER DAYS             Current Facility-Administered Medications   Medication Dose Route Frequency   ??? lamoTRIgine (LaMICtal) tablet 50 mg  50 mg Oral DAILY   ??? enoxaparin (LOVENOX) injection 90 mg  90 mg SubCUTAneous Q12H   ??? sodium chloride (NS) flush 5-40 mL  5-40 mL IntraVENous Q8H   ??? sodium chloride (NS) flush 5-40 mL  5-40 mL IntraVENous PRN   ??? ondansetron (ZOFRAN ODT) tablet 4 mg  4 mg Oral Q8H PRN    Or   ??? ondansetron (ZOFRAN) injection 4 mg  4 mg IntraVENous Q6H PRN   ??? cholecalciferol (VITAMIN D3) (1000 Units /25 mcg) tablet 2,000 Units  2,000 Units Oral DAILY   ??? dexamethasone (DECADRON) 4 mg/mL injection 6 mg  6 mg IntraVENous Q24H   ??? acetaminophen (TYLENOL) tablet 1,000 mg  1,000 mg Oral Q8H PRN   ??? cetirizine (ZYRTEC) tablet 10 mg  10 mg Oral DAILY   ??? fluticasone propionate (FLONASE) 50 mcg/actuation nasal spray 2 Spray  2 Spray Both Nostrils DAILY   ??? furosemide (LASIX) tablet 40 mg  40 mg Oral DAILY   ??? hydrOXYzine HCL (ATARAX) tablet 25 mg  25 mg Oral TID PRN   ??? pantoprazole (PROTONIX) tablet 40 mg  40 mg Oral DAILY   ??? pravastatin (PRAVACHOL) tablet 40 mg  40 mg Oral QHS   ??? pregabalin (LYRICA) capsule 200 mg  200 mg Oral TID   ???  metoprolol succinate (TOPROL-XL) XL tablet 25 mg  25 mg Oral DAILY   ??? albuterol (PROVENTIL HFA, VENTOLIN HFA, PROAIR  HFA) inhaler 2 Puff  2 Puff Inhalation Q6H PRN   ??? ipratropium-albuterol (COMBIVENT RESPIMAT) 20 mcg-100 mcg inhalation spray  1 Puff Inhalation Q6H RT   ??? Warfarin - Pharmacy to Dose   Other Rx Dosing/Monitoring       Allergies: Adhesive and Lithium    Family History   Problem Relation Age of Onset   ??? No Known Problems Mother      Social History     Socioeconomic History   ??? Marital status: DIVORCED     Spouse name: Not on file   ??? Number of children: Not on file   ??? Years of education: Not on file   ??? Highest education level: Not on file   Occupational History   ??? Not on file   Tobacco Use   ??? Smoking status: Current Every Day Smoker     Packs/day: 1.00   ??? Smokeless tobacco: Never Used   Substance and Sexual Activity   ??? Alcohol use: Never   ??? Drug use: Yes     Types: Marijuana   ??? Sexual activity: Not on file   Other Topics Concern   ??? Not on file   Social History Narrative   ??? Not on file     Social Determinants of Health     Financial Resource Strain:    ??? Difficulty of Paying Living Expenses: Not on file   Food Insecurity:    ??? Worried About Running Out of Food in the Last Year: Not on file   ??? Ran Out of Food in the Last Year: Not on file   Transportation Needs:    ??? Lack of Transportation (Medical): Not on file   ??? Lack of Transportation (Non-Medical): Not on file   Physical Activity:    ??? Days of Exercise per Week: Not on file   ??? Minutes of Exercise per Session: Not on file   Stress:    ??? Feeling of Stress : Not on file   Social Connections:    ??? Frequency of Communication with Friends and Family: Not on file   ??? Frequency of Social Gatherings with Friends and Family: Not on file   ??? Attends Religious Services: Not on file   ??? Active Member of Clubs or Organizations: Not on file   ??? Attends BankerClub or Organization Meetings: Not on file   ??? Marital Status: Not on file   Intimate Partner  Violence:    ??? Fear of Current or Ex-Partner: Not on file   ??? Emotionally Abused: Not on file   ??? Physically Abused: Not on file   ??? Sexually Abused: Not on file   Housing Stability:    ??? Unable to Pay for Housing in the Last Year: Not on file   ??? Number of Places Lived in the Last Year: Not on file   ??? Unstable Housing in the Last Year: Not on file     Social History     Tobacco Use   Smoking Status Current Every Day Smoker   ??? Packs/day: 1.00   Smokeless Tobacco Never Used        Temp (24hrs), Avg:99 ??F (37.2 ??C), Min:98.3 ??F (36.8 ??C), Max:100.5 ??F (38.1 ??C)    Visit Vitals  BP 128/74 (BP 1 Location: Left upper arm, BP Patient Position: At rest)   Pulse 72   Temp 98.3 ??F (36.8 ??C)   Resp 18   Ht 5\' 3"  (1.6 m)   Wt 86.2 kg (  190 lb)   SpO2 96%   Breastfeeding No   BMI 33.66 kg/m??       ROS: 12 point ROS obtained in details. Pertinent positives as mentioned in HPI,   otherwise negative    Physical Exam:    Vitals signs and nursing note reviewed.   Constitutional:    Sitting on bed.  Alert awake oriented x3, appears comfortable  HENT:      Head: Normocephalic.   Eyes:      Conjunctiva/sclera: Conjunctivae normal.      Neck:      Musculoskeletal: Normal range of motion and neck supple.   Cardiovascular:      Rate and Rhythm: Normal rate and regular rhythm on monitor  Chest:      Bilateral chest movements equal.  Auscultation deferred due to Covid positive  Abdominal:      General: There is no distension.      Palpations: Abdomen is soft.      Tenderness: There is no abdominal tenderness. There is no rebound.   Musculoskeletal: Normal range of motion.         General: No tenderness.   Skin:     General: Skin is warm and dry.      Findings: No rash.   Neurological:      Mental Status:  alert and oriented to person, place, and time.      Cranial Nerves: No cranial nerve deficit.      Motor: No abnormal muscle tone.      Coordination: Coordination normal.   Psychiatric:         Behavior: Behavior normal.         Thought  Content: Thought content normal.         Judgment: Judgment normal.     Labs: Results:   Chemistry Recent Labs     05/03/20  0940   GLU 98   NA 140   K 3.6   CL 103   CO2 27   BUN 8   CREA 0.99   CA 9.2   AGAP 10   BUCR 8*   AP 55   TP 7.3   ALB 4.2   GLOB 3.1   AGRAT 1.4      CBC w/Diff Recent Labs     05/03/20  0940   WBC 2.7*   RBC 4.75   HGB 13.9   HCT 43.1   PLT 200   GRANS 67   LYMPH 16*   EOS 0      Microbiology No results for input(s): CULT in the last 72 hours.       RADIOLOGY:    All available imaging studies/reports in connect care for this admission were reviewed      Disclaimer: Sections of this note are dictated utilizing voice recognition software, which may have resulted in some phonetic based errors in grammar and contents. Even though attempts were made to correct all the mistakes, some may have been missed, and remained in the body of the document. If questions arise, please contact our department.    Dr. Raiford Simmonds, Infectious Disease Specialist  [phone]  May 04, 2020  8:54 AM

## 2020-05-04 NOTE — Progress Notes (Signed)
 Problem: Mobility Impaired (Adult and Pediatric)  Goal: *Acute Goals and Plan of Care (Insert Text)  Outcome: Resolved/Met   PHYSICAL THERAPY EVALUATION AND DISCHARGE    Patient: Mary [name] (54 y.o. female)  Date: 05/04/2020  Primary Diagnosis: Respiratory failure (HCC) [J96.90]  COVID-19 [U07.1]  Pneumonia due to COVID-19 virus [U07.1, J12.82]  Precautions: Fall  PLOF: Independent; has prescription for cane d/t chronic back pain  ASSESSMENT :  Droplet plus isolation. On 4L O2. Educated on activity pacing and benefits of EOB/OOB for meals and toileting; verbalized understanding. Reports chronic back pain. Expresses frustration with social circumstances and current situation. Poor compliance with education. Mod I for bed mobility and transfers. Amb 86ft with supervision. Decreased gait speed. Uses handheld assist for balance while amb; reports she has prescription for cane but has not filled it yet. Recommend ww for amb. Demonstrates appropriate for mobility for room with staff supervision. Educated on need for RN assistance with mobility; verbalized understanding. Call bell in reach.     Skilled physical therapy is not indicated at this time.      PLAN :  Discharge Recommendations: Home Health  Further Equipment Recommendations for Discharge: rolling walker     SUBJECTIVE:   Patient stated "I never leave the house."    OBJECTIVE DATA SUMMARY:     Past Medical History:   Diagnosis Date   . Anxiety    . Asthma    . Bipolar 1 disorder (HCC)    . CHF (congestive heart failure) (HCC)    . COPD (chronic obstructive pulmonary disease) (HCC)    . H/O aortic valve replacement    . PTSD (post-traumatic stress disorder)      Past Surgical History:   Procedure Laterality Date   . HX HEART CATHETERIZATION     . HX HEART VALVE SURGERY       Barriers to Learning/Limitations: None  Compensate with: Visual Cues, Verbal Cues, Tactile Cues and Kinesthetic Cues  Home Situation:   Home Situation  Home Environment: Private  residence  # Steps to Enter: 5  Rails to Enter: Yes  One/Two Story Residence: One story  Living Alone: No  Support Systems: Other Family Member(s)  Patient Expects to be Discharged to::  (returned back to the room where she paying rent)  Current DME Used/Available at Home: None  Critical Behavior:  Neurologic State: Alert  Orientation Level: Oriented X4  Cognition: Follows commands     Psychosocial  Patient Behaviors: Cooperative    Strength:    Manual Muscle Testing (LE)         R     L    Hip Flexion:   4+/5  4+/5  Knee EXT:   4+/5  4+/5  Knee FLEX:   4+/5  4+/5  Ankle DF:   4+/5  4+/5  _________________________________________________   Range Of Motion:  BLE AROM WFL   Functional Mobility:  Bed Mobility:  Supine to Sit: Modified independent  Sit to Supine: Modified independent  Transfers:  Sit to Stand: Modified independent  Stand to Sit: Modified independent  Balance:   Sitting: Intact  Standing: Impaired  Standing - Static: Good  Standing - Dynamic : Good  Ambulation/Gait Training:  Distance (ft): 20 Feet (ft)   Ambulation - Level of Assistance: Supervision  Neuro Re-education:  Seated EOB 15 minutes    Pain:  Pain level pre-treatment: 0/10   Pain level post-treatment: 0/10    Activity Tolerance:   Fair    After  treatment:   [x]          Patient left in no apparent distress sitting up in chair  []          Patient left in no apparent distress in bed  [x]          Call bell left within reach  [x]          Nursing notified  []          Caregiver present  []          Bed alarm activated  []          SCDs applied    COMMUNICATION/EDUCATION:   [x]          Role of physical therapy in the acute care setting.  [x]          Fall prevention education was provided and the patient/caregiver indicated understanding.  [x]          Patient/family have participated as able in goal setting and plan of care.  [x]          Patient/family agree to work toward stated goals and plan of care.  []          Patient understands intent and goals of  therapy, but is neutral about his/her participation.  []          Patient is unable to participate in goal setting/plan of care: ongoing with therapy staff.    Thank you for this referral.  Edsel LOISE Loll, PT   Time Calculation: 26 mins    Eval Complexity: History: MEDIUM  Complexity : 1-2 comorbidities / personal factors will impact the outcome/ POC Exam:MEDIUM Complexity : 3 Standardized tests and measures addressing body structure, function, activity limitation and / or participation in recreation  Presentation: MEDIUM Complexity : Evolving with changing characteristics  Clinical Decision Making:Medium Complexity  Clinical judgement; ROM, MMT, functional mobility Overall Complexity:MEDIUM

## 2020-05-04 NOTE — Progress Notes (Signed)
Received report on pt.from off going RN.  Resting quietly in bed on rounds. Denies c/o pain or SOB at this time. Call bell at room.02 on at 5 LPM.  No acute distress noted. Will cont to monitor for any changes in status.     1000 sitting up in chair. Call bell at side.02 on at 3 LPM.     1400 up to BR. Denies CP or SOB. 02 sats remain mid 90's. 02 decreased to 2 Lpm.     1600 tolerating 02 at 2LPM well. No distress noted. Requesting stronger pain med. sts tylenol is not helping. MD notified.      1819 ultram given po. X 1 dose.     2000 Bedside and Verbal shift change report given to Adeline RN (oncoming nurse) by Okey Regal, RN (offgoing nurse).  Report given with SBAR, Kardex and MAR.

## 2020-05-05 LAB — ECHO ADULT FOLLOW-UP OR LIMITED
AV Area by Peak Velocity: 2.3 cm2
AV Area by Peak Velocity: 2.3 cm2
AV Area by VTI: 2.5 cm2
AV Area by VTI: 2.5 cm2
AV Mean Gradient: 7 mmHg
AV Mean Velocity: 1.3 m/s
AV Peak Gradient: 13 mmHg
AV Peak Velocity: 1.8 m/s
AV VTI: 29.8 cm
AV Velocity Ratio: 0.72
Ao Root Index: 1.25 cm/m2
Aortic Root: 2.4 cm
EF Physician: 50 %
Fractional Shortening 2D: 18 % (ref 28–44)
IVSd: 1.1 cm — AB (ref 0.6–0.9)
LV Mass 2D Index: 107.9 g/m2 — AB (ref 43–95)
LV Mass 2D: 207.1 g — AB (ref 67–162)
LV RWT Ratio: 0.44
LVIDd Index: 2.6 cm/m2
LVIDd: 5 cm (ref 3.9–5.3)
LVIDs Index: 2.14 cm/m2
LVIDs: 4.1 cm
LVOT Area: 3.1 cm2
LVOT Diameter: 2 cm
LVOT Mean Gradient: 3 mmHg
LVOT Peak Gradient: 7 mmHg
LVOT Peak Velocity: 1.3 m/s
LVOT SV: 73.5 ml
LVOT Stroke Volume Index: 38.3 mL/m2
LVOT VTI: 23.4 cm
LVOT:AV VTI Index: 0.79
LVPWd: 1.1 cm — AB (ref 0.6–0.9)
MV Area by PHT: 1.8 cm2
MV Area by VTI: 1.3 cm2
MV Max Velocity: 2.2 m/s
MV Mean Gradient: 9 mmHg
MV Mean Velocity: 1.4 m/s
MV PHT: 124.3 ms
MV Peak Gradient: 19 mmHg
MV VTI: 58.5 cm
MV:LVOT VTI Index: 2.5
PASP: 27 mmHg
TR Max Velocity: 2.46 m/s
TR Peak Gradient: 24 mmHg

## 2020-05-05 LAB — CBC WITH AUTOMATED DIFF
ABS. BASOPHILS: 0 10*3/uL (ref 0.0–0.1)
ABS. EOSINOPHILS: 0 10*3/uL (ref 0.0–0.4)
ABS. IMM. GRANS.: 0 10*3/uL (ref 0.00–0.04)
ABS. LYMPHOCYTES: 0.8 10*3/uL — ABNORMAL LOW (ref 0.9–3.6)
ABS. MONOCYTES: 0.3 10*3/uL (ref 0.05–1.2)
ABS. NEUTROPHILS: 1.8 10*3/uL (ref 1.8–8.0)
ABSOLUTE NRBC: 0 10*3/uL (ref 0.00–0.01)
BASOPHILS: 0 % (ref 0–2)
EOSINOPHILS: 0 % (ref 0–5)
HCT: 42.9 % (ref 35.0–45.0)
HGB: 13.7 g/dL (ref 12.0–16.0)
IMMATURE GRANULOCYTES: 0 % (ref 0.0–0.5)
LYMPHOCYTES: 28 % (ref 21–52)
MCH: 29.1 PG (ref 24.0–34.0)
MCHC: 31.9 g/dL (ref 31.0–37.0)
MCV: 91.1 FL (ref 78.0–100.0)
MONOCYTES: 9 % (ref 3–10)
MPV: 10.3 FL (ref 9.2–11.8)
NEUTROPHILS: 62 % (ref 40–73)
NRBC: 0 PER 100 WBC
PLATELET: 185 10*3/uL (ref 135–420)
RBC: 4.71 M/uL (ref 4.20–5.30)
RDW: 13.1 % (ref 11.6–14.5)
WBC: 3 10*3/uL — ABNORMAL LOW (ref 4.6–13.2)

## 2020-05-05 LAB — PROTHROMBIN TIME + INR
INR: 1.6 — ABNORMAL HIGH (ref 0.8–1.2)
Prothrombin time: 18.6 s — ABNORMAL HIGH (ref 11.5–15.2)

## 2020-05-05 LAB — METABOLIC PANEL, BASIC
Anion gap: 5 mmol/L (ref 3.0–18)
Anion gap: 5 mmol/L (ref 3.0–18)
BUN/Creatinine ratio: 13 (ref 12–20)
BUN/Creatinine ratio: 17 (ref 12–20)
BUN: 13 MG/DL (ref 7.0–18)
BUN: 13 MG/DL (ref 7.0–18)
CO2: 29 mmol/L (ref 21–32)
CO2: 25 mmol/L (ref 21–32)
Calcium: 9.3 MG/DL (ref 8.5–10.1)
Calcium: 8.9 MG/DL (ref 8.5–10.1)
Chloride: 105 mmol/L (ref 100–111)
Chloride: 107 mmol/L (ref 100–111)
Creatinine: 0.98 MG/DL (ref 0.6–1.3)
Creatinine: 0.78 MG/DL (ref 0.6–1.3)
GFR est AA: >60 mL/min/{1.73_m2} (ref >60)
GFR est AA: >60 mL/min/{1.73_m2} (ref >60)
GFR est non-AA: 59 mL/min/{1.73_m2} — ABNORMAL LOW (ref >60)
GFR est non-AA: >60 mL/min/{1.73_m2} (ref >60)
Glucose: 126 mg/dL — ABNORMAL HIGH (ref 74–99)
Glucose: 102 mg/dL — ABNORMAL HIGH (ref 74–99)
Potassium: 4.5 mmol/L (ref 3.5–5.5)
Potassium: 3.9 mmol/L (ref 3.5–5.5)
Sodium: 139 mmol/L (ref 136–145)
Sodium: 137 mmol/L (ref 136–145)

## 2020-05-05 LAB — GLUCOSE, POC: Glucose (POC): 128 mg/dL — ABNORMAL HIGH (ref 70–110)

## 2020-05-05 LAB — PROCALCITONIN
Procalcitonin: <0.05 ng/mL
Procalcitonin: <0.05 ng/mL

## 2020-05-05 LAB — C REACTIVE PROTEIN, QT: C-Reactive protein: 0.5 mg/dL — ABNORMAL HIGH (ref 0–0.3)

## 2020-05-05 LAB — D DIMER: D DIMER: 0.38 ug/ml(FEU) (ref <0.46)

## 2020-05-05 LAB — FERRITIN
Ferritin: 104 NG/ML (ref 8–388)
Ferritin: 104 NG/ML (ref 8–388)

## 2020-05-05 LAB — BASIC METABOLIC PANEL
Anion Gap: 5 mmol/L (ref 3.0–18)
Anion Gap: 5 mmol/L (ref 3.0–18)
BUN: 13 MG/DL (ref 7.0–18)
BUN: 13 MG/DL (ref 7.0–18)
Bun/Cre Ratio: 13 (ref 12–20)
Bun/Cre Ratio: 17 (ref 12–20)
CO2: 29 mmol/L (ref 21–32)
CO2: 25 mmol/L (ref 21–32)
Calcium: 9.3 MG/DL (ref 8.5–10.1)
Calcium: 8.9 MG/DL (ref 8.5–10.1)
Chloride: 105 mmol/L (ref 100–111)
Chloride: 107 mmol/L (ref 100–111)
Creatinine: 0.98 MG/DL (ref 0.6–1.3)
Creatinine: 0.78 MG/DL (ref 0.6–1.3)
EGFR IF NonAfrican American: 59 mL/min/{1.73_m2} — ABNORMAL LOW (ref >60)
EGFR IF NonAfrican American: >60 mL/min/{1.73_m2} (ref >60)
GFR African American: >60 mL/min/{1.73_m2} (ref >60)
GFR African American: >60 mL/min/{1.73_m2} (ref >60)
Glucose: 126 mg/dL — ABNORMAL HIGH (ref 74–99)
Glucose: 102 mg/dL — ABNORMAL HIGH (ref 74–99)
Potassium: 4.5 mmol/L (ref 3.5–5.5)
Potassium: 3.9 mmol/L (ref 3.5–5.5)
Sodium: 139 mmol/L (ref 136–145)
Sodium: 137 mmol/L (ref 136–145)

## 2020-05-05 LAB — POCT GLUCOSE: POC Glucose: 128 mg/dL — ABNORMAL HIGH (ref 70–110)

## 2020-05-05 LAB — CBC WITH AUTO DIFFERENTIAL
Basophils %: 0 % (ref 0–2)
Basophils Absolute: 0 10*3/uL (ref 0.0–0.1)
Eosinophils %: 0 % (ref 0–5)
Eosinophils Absolute: 0 10*3/uL (ref 0.0–0.4)
Granulocyte Absolute Count: 0 10*3/uL (ref 0.00–0.04)
Hematocrit: 42.9 % (ref 35.0–45.0)
Hemoglobin: 13.7 g/dL (ref 12.0–16.0)
Immature Granulocytes: 0 % (ref 0.0–0.5)
Lymphocytes %: 28 % (ref 21–52)
Lymphocytes Absolute: 0.8 10*3/uL — ABNORMAL LOW (ref 0.9–3.6)
MCH: 29.1 PG (ref 24.0–34.0)
MCHC: 31.9 g/dL (ref 31.0–37.0)
MCV: 91.1 FL (ref 78.0–100.0)
MPV: 10.3 FL (ref 9.2–11.8)
Monocytes %: 9 % (ref 3–10)
Monocytes Absolute: 0.3 10*3/uL (ref 0.05–1.2)
NRBC Absolute: 0 10*3/uL (ref 0.00–0.01)
Neutrophils %: 62 % (ref 40–73)
Neutrophils Absolute: 1.8 10*3/uL (ref 1.8–8.0)
Nucleated RBCs: 0 PER 100 WBC
Platelets: 185 10*3/uL (ref 135–420)
RBC: 4.71 M/uL (ref 4.20–5.30)
RDW: 13.1 % (ref 11.6–14.5)
WBC: 3 10*3/uL — ABNORMAL LOW (ref 4.6–13.2)

## 2020-05-05 LAB — PROTIME-INR
INR: 1.6 — ABNORMAL HIGH (ref 0.8–1.2)
Protime: 18.6 s — ABNORMAL HIGH (ref 11.5–15.2)

## 2020-05-05 LAB — C-REACTIVE PROTEIN: CRP: 0.5 mg/dL — ABNORMAL HIGH (ref 0–0.3)

## 2020-05-05 LAB — D-DIMER, QUANTITATIVE: D-Dimer, Quant: 0.38 ug/ml(FEU) (ref <0.46)

## 2020-05-05 MED ORDER — ACETAMINOPHEN 500 MG TAB
500 mg | Freq: Once | ORAL | Status: AC
Start: 2020-05-05 — End: 2020-05-05
  Administered 2020-05-05: 17:00:00 via ORAL

## 2020-05-05 MED ORDER — WARFARIN 7.5 MG TAB
7.5 mg | Freq: Once | ORAL | Status: AC
Start: 2020-05-05 — End: 2020-05-05
  Administered 2020-05-05: 22:00:00 via ORAL

## 2020-05-05 MED FILL — METOPROLOL SUCCINATE SR 25 MG 24 HR TAB: 25 mg | ORAL | Qty: 1

## 2020-05-05 MED FILL — ACETAMINOPHEN 500 MG TAB: 500 mg | ORAL | Qty: 2

## 2020-05-05 MED FILL — FUROSEMIDE 40 MG TAB: 40 mg | ORAL | Qty: 1

## 2020-05-05 MED FILL — ACETAMINOPHEN 500 MG TAB: 500 mg | ORAL | Qty: 1

## 2020-05-05 MED FILL — COUMADIN 7.5 MG TABLET: 7.5 mg | ORAL | Qty: 1

## 2020-05-05 MED FILL — LAMOTRIGINE 25 MG TAB: 25 mg | ORAL | Qty: 2

## 2020-05-05 MED FILL — PREGABALIN 50 MG CAP: 50 mg | ORAL | Qty: 1

## 2020-05-05 MED FILL — PRAVASTATIN 20 MG TAB: 20 mg | ORAL | Qty: 2

## 2020-05-05 MED FILL — COMBIVENT RESPIMAT 20 MCG-100 MCG/ACTUATION SOLUTION FOR INHALATION: 20-100 mcg/actuation | RESPIRATORY_TRACT | Qty: 0.01

## 2020-05-05 MED FILL — DEXAMETHASONE SODIUM PHOSPHATE 4 MG/ML IJ SOLN: 4 mg/mL | INTRAMUSCULAR | Qty: 2

## 2020-05-05 MED FILL — ENOXAPARIN 100 MG/ML SUB-Q SYRINGE: 100 mg/mL | SUBCUTANEOUS | Qty: 1

## 2020-05-05 MED FILL — CETIRIZINE 10 MG TAB: 10 mg | ORAL | Qty: 1

## 2020-05-05 MED FILL — PANTOPRAZOLE 40 MG TAB, DELAYED RELEASE: 40 mg | ORAL | Qty: 1

## 2020-05-05 MED FILL — CHOLECALCIFEROL (VITAMIN D3) 1,000 UNIT (25 MCG) TAB: ORAL | Qty: 2

## 2020-05-05 NOTE — Progress Notes (Signed)
Progress Notes by Benedetto Goad, DO at 05/05/20 [phone]                Author: Benedetto Goad, DO  Service: FAMILY MEDICINE  Author Type: Resident       Filed: 05/05/20 0651  Date of Service: 05/05/20 0644  Status: Attested           Editor: Benedetto Goad, DO (Resident)  Cosigner: Caryl Ada, MD at 05/05/20 1754          Attestation signed by Caryl Ada, MD at 05/05/20 1754                 I have seen and examined the patient. I reviewed the resident's note and agree with below.     Cough more congested and doesn't feel as well as yesterday. Continue to observe respiratory status.      Caryl Ada, MD   05/05/2020, 5:53 PM                                       Intern Progress Note   [facility]              Patient: Mary [name]  MRN: [id number]   CSN: [id number]          Date of Birth: [birth year]   Age: 54 y.o.   Sex: female          DOA: 05/03/2020  LOS:  LOS: 2 days                          Subjective:            No events overnight.        Patient seen and examined this morning.  No complaints, states continues to feel better.  Oxygen decreased to 2L yesterday.  She has been OOB and walking around room.  Worried about getting home soon to help take care of her dog.  States will need transportation  home.  NC taken off patient during rounds.        Review of Systems    Constitutional: Negative for chills, fever and malaise/fatigue.    Respiratory: Positive for cough. Negative for hemoptysis, sputum production, shortness of breath and wheezing.     Cardiovascular: Negative for chest pain, palpitations, orthopnea and leg swelling.    Gastrointestinal: Negative for abdominal pain, constipation, diarrhea, heartburn, nausea and vomiting.    Musculoskeletal: Negative for myalgias.    Skin: Negative for rash.    Neurological: Negative for headaches.            Objective:        Patient Vitals for the past 12 hrs:            Temp  Pulse  Resp  BP  SpO2             05/05/20 0355  98.4 ??F (36.9 ??C)  71  16  120/72  96 %            05/05/20 0029  99.7 ??F (37.6 ??C)  79  18  111/68  93 %            05/04/20 2016  100.1 ??F (37.8 ??C)  84  18  111/64  92 %            No  intake or output data in the 24 hours ending 05/05/20 0645      Physical Exam:    General: well-appearing, NAD, AOx3   CV:  RRR, no M/G/R   RESP: Unlabored breathing. Wheezing improved.  No rhonchi and rales. Equal expansion bilaterally.    ABD:  Soft, nontender, nondistended. No hepatosplenomegaly.    MS:  No joint deformity or instability. No atrophy.   Ext:  No edema.  2+ radial and dp pulses bilaterally.   Skin: Warm & dry. No rashes, lesions, or ulcers.  Good turgor.    Psych: normal mood and affect       Lab/Data Reviewed:     Recent Results (from the past 24 hour(s))     METABOLIC PANEL, BASIC          Collection Time: 05/04/20 10:48 AM         Result  Value  Ref Range            Sodium  137  136 - 145 mmol/L       Potassium  3.3 (L)  3.5 - 5.5 mmol/L       Chloride  107  100 - 111 mmol/L       CO2  25  21 - 32 mmol/L       Anion gap  5  3.0 - 18 mmol/L       Glucose  88  74 - 99 mg/dL       BUN  12  7.0 - 18 MG/DL       Creatinine  1.61  0.6 - 1.3 MG/DL       BUN/Creatinine ratio  16  12 - 20         GFR est AA  >60  >60 ml/min/1.72m2       GFR est non-AA  >60  >60 ml/min/1.70m2       Calcium  9.0  8.5 - 10.1 MG/DL       CBC WITH AUTOMATED DIFF          Collection Time: 05/04/20 10:48 AM         Result  Value  Ref Range            WBC  3.2 (L)  4.6 - 13.2 K/uL       RBC  4.54  4.20 - 5.30 M/uL       HGB  13.1  12.0 - 16.0 g/dL       HCT  09.6  04.5 - 45.0 %       MCV  91.4  78.0 - 100.0 FL            MCH  28.9  24.0 - 34.0 PG            MCHC  31.6  31.0 - 37.0 g/dL       RDW  40.9  81.1 - 14.5 %       PLATELET  175  135 - 420 K/uL       MPV  10.2  9.2 - 11.8 FL       NRBC  0.0  0 PER 100 WBC       ABSOLUTE NRBC  0.00  0.00 - 0.01 K/uL       NEUTROPHILS  56  40 - 73 %       LYMPHOCYTES  32  21 - 52 %        MONOCYTES  11 (H)  3 -  10 %       EOSINOPHILS  0  0 - 5 %       BASOPHILS  0  0 - 2 %       IMMATURE GRANULOCYTES  0  0.0 - 0.5 %       ABS. NEUTROPHILS  1.8  1.8 - 8.0 K/UL       ABS. LYMPHOCYTES  1.1  0.9 - 3.6 K/UL       ABS. MONOCYTES  0.4  0.05 - 1.2 K/UL       ABS. EOSINOPHILS  0.0  0.0 - 0.4 K/UL       ABS. BASOPHILS  0.0  0.0 - 0.1 K/UL       ABS. IMM. GRANS.  0.0  0.00 - 0.04 K/UL       DF  AUTOMATED          PROTHROMBIN TIME + INR          Collection Time: 05/04/20 10:48 AM         Result  Value  Ref Range            Prothrombin time  16.4 (H)  11.5 - 15.2 sec       INR  1.3 (H)  0.8 - 1.2         PTT          Collection Time: 05/04/20 10:48 AM         Result  Value  Ref Range            aPTT  46.2 (H)  23.0 - 36.4 SEC       FIBRINOGEN          Collection Time: 05/04/20 10:48 AM         Result  Value  Ref Range            Fibrinogen  442  210 - 451 mg/dL       PROCALCITONIN          Collection Time: 05/04/20 10:48 AM         Result  Value  Ref Range            Procalcitonin  <0.05  ng/mL       C REACTIVE PROTEIN, QT          Collection Time: 05/04/20 10:48 AM         Result  Value  Ref Range            C-Reactive protein  <0.3  0 - 0.3 mg/dL       D DIMER          Collection Time: 05/04/20 10:48 AM         Result  Value  Ref Range            D DIMER  0.45  <0.46 ug/ml(FEU)       FERRITIN          Collection Time: 05/04/20 10:48 AM         Result  Value  Ref Range            Ferritin  95  8 - 388 NG/ML       TROPONIN-HIGH SENSITIVITY          Collection Time: 05/04/20 10:48 AM         Result  Value  Ref Range            Troponin-High Sensitivity  6  0 - 54 ng/L       ECHO ADULT FOLLOW-UP OR LIMITED          Collection Time: 05/04/20  3:34 PM         Result  Value  Ref Range            IVSd  1.1 (A)  0.6 - 0.9 cm       LVIDd  5.0  3.9 - 5.3 cm       LVIDs  4.1  cm       LVOT Diameter  2.0  cm       LVPWd  1.1 (A)  0.6 - 0.9 cm       LVOT SV  73.5  ml       LVOT Peak Gradient  7  mmHg       LVOT Mean  Gradient  3  mmHg       LVOT Peak Velocity  1.3  m/s       LVOT VTI  23.4  cm       AV Area by Peak Velocity  2.3  cm2       AV Area by Peak Velocity  2.3  cm2       AV Area by VTI  2.5  cm2       AV Area by VTI  2.5  cm2       AV Peak Gradient  13  mmHg       AV Mean Gradient  7  mmHg       AV Peak Velocity  1.8  m/s       AV Mean Velocity  1.3  m/s       AV VTI  29.8  cm       MV Area by VTI  1.3  cm2       MV Peak Gradient  19  mmHg       MV Mean Gradient  9  mmHg       MV PHT  124.3  ms       MV Max Velocity  2.2  m/s       MV Mean Velocity  1.4  m/s       MV VTI  58.5  cm       MV Area by PHT  1.8  cm2       TR Peak Gradient  24  mmHg       TR Max Velocity  2.46  m/s       Aortic Root  2.4  cm       Fractional Shortening 2D  18  28 - 44 %       LVIDd Index  2.60  cm/m2       LVIDs Index  2.14  cm/m2       LV RWT Ratio  0.44         LV Mass 2D  207.1 (A)  67 - 162 g       LV Mass 2D Index  107.9 (A)  43 - 95 g/m2       LVOT Stroke Volume Index  38.3  mL/m2       LVOT Area  3.1  cm2       Ao Root Index  1.25  cm/m2       AV Velocity Ratio  0.72         LVOT:AV VTI Index  0.79  ZO:XWRU VTI Index  2.50         PASP  27  mmHg       EF Physician  50  %       METABOLIC PANEL, BASIC          Collection Time: 05/04/20  9:49 PM         Result  Value  Ref Range            Sodium  139  136 - 145 mmol/L       Potassium  4.5  3.5 - 5.5 mmol/L       Chloride  105  100 - 111 mmol/L       CO2  29  21 - 32 mmol/L       Anion gap  5  3.0 - 18 mmol/L       Glucose  126 (H)  74 - 99 mg/dL       BUN  13  7.0 - 18 MG/DL       Creatinine  0.45  0.6 - 1.3 MG/DL       BUN/Creatinine ratio  13  12 - 20         GFR est AA  >60  >60 ml/min/1.76m2       GFR est non-AA  59 (L)  >60 ml/min/1.3m2       Calcium  9.3  8.5 - 10.1 MG/DL       GLUCOSE, POC          Collection Time: 05/04/20 10:31 PM         Result  Value  Ref Range            Glucose (POC)  128 (H)  70 - 110 mg/dL       METABOLIC PANEL, BASIC          Collection Time:  05/05/20  4:03 AM         Result  Value  Ref Range            Sodium  137  136 - 145 mmol/L       Potassium  3.9  3.5 - 5.5 mmol/L       Chloride  107  100 - 111 mmol/L       CO2  25  21 - 32 mmol/L       Anion gap  5  3.0 - 18 mmol/L       Glucose  102 (H)  74 - 99 mg/dL       BUN  13  7.0 - 18 MG/DL       Creatinine  4.09  0.6 - 1.3 MG/DL       BUN/Creatinine ratio  17  12 - 20         GFR est AA  >60  >60 ml/min/1.30m2       GFR est non-AA  >60  >60 ml/min/1.76m2       Calcium  8.9  8.5 - 10.1 MG/DL       CBC WITH AUTOMATED DIFF          Collection Time: 05/05/20  4:03 AM         Result  Value  Ref Range            WBC  3.0 (L)  4.6 - 13.2 K/uL       RBC  4.71  4.20 - 5.30 M/uL       HGB  13.7  12.0 - 16.0 g/dL       HCT  96.2  95.2 - 45.0 %       MCV  91.1  78.0 - 100.0 FL       MCH  29.1  24.0 - 34.0 PG       MCHC  31.9  31.0 - 37.0 g/dL       RDW  84.1  32.4 - 14.5 %       PLATELET  185  135 - 420 K/uL       MPV  10.3  9.2 - 11.8 FL       NRBC  0.0  0 PER 100 WBC       ABSOLUTE NRBC  0.00  0.00 - 0.01 K/uL       NEUTROPHILS  62  40 - 73 %       LYMPHOCYTES  28  21 - 52 %       MONOCYTES  9  3 - 10 %       EOSINOPHILS  0  0 - 5 %       BASOPHILS  0  0 - 2 %       IMMATURE GRANULOCYTES  0  0.0 - 0.5 %       ABS. NEUTROPHILS  1.8  1.8 - 8.0 K/UL       ABS. LYMPHOCYTES  0.8 (L)  0.9 - 3.6 K/UL       ABS. MONOCYTES  0.3  0.05 - 1.2 K/UL       ABS. EOSINOPHILS  0.0  0.0 - 0.4 K/UL            ABS. BASOPHILS  0.0  0.0 - 0.1 K/UL            ABS. IMM. GRANS.  0.0  0.00 - 0.04 K/UL       DF  AUTOMATED          PROCALCITONIN          Collection Time: 05/05/20  4:03 AM         Result  Value  Ref Range            Procalcitonin  <0.05  ng/mL       C REACTIVE PROTEIN, QT          Collection Time: 05/05/20  4:03 AM         Result  Value  Ref Range            C-Reactive protein  0.5 (H)  0 - 0.3 mg/dL       D DIMER          Collection Time: 05/05/20  4:03 AM         Result  Value  Ref Range            D DIMER  0.38  <0.46  ug/ml(FEU)       FERRITIN          Collection Time: 05/05/20  4:03 AM         Result  Value  Ref Range            Ferritin  104  8 - 388 NG/ML       PROTHROMBIN TIME + INR          Collection Time: 05/05/20  4:03 AM         Result  Value  Ref Range  Prothrombin time  18.6 (H)  11.5 - 15.2 sec            INR  1.6 (H)  0.8 - 1.2               Assessment & Plan:        This is a 54 y.o. 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 She is improved since admission, NC discontinued this morning, will monitor for hypoxia this morning, patient should continue  walking and OOB, will work for discharge home in the next 24-48 hours.     ??   1. Acute Hypoxic Respiratory failure 2/2 COVID-19 PNA:    Improved over past 24 hours, no longer on supplemental oxygen.      - Supplemental oxygen as needed, wean as tolerated    - PT/OT/CM    - Infectious disease following, appreciate recommendations     - Trend CBC, BMP, CRP, D-dimer, Ferritin, Procal    - Continue dexamethasone IV 6 mg daily    - Contact precautions     - Continue home inhalers at this time     - this afternoon vs tomorrow    ??   2. pAfib currently NSR    - Continue Lovenox BID until INR therapeutic     - Pharmacy recommendations reviewed, appreciate assistance     - Continue Metoprolol 25 mg daily    ??   3. Bioprosthetic mitral valve replacement/Maze procedure/LAA appendage ligation in 2017/stable angina pectoris(s/p LHC):   LHC significant for Prox RCA lesion 30% stenosed.  Otherwise unremarkable.  BNP 1579, negative high-sensitivity troponin x 2    - Continue Lasix 40 mg daily    - TTE results reviewed     - Follow Troponin q6hrs 3 times   ??   4. Tobacco dependence/questionable COPD:   Patient with  40-pack-year smoking history.  Current every day smoker.    - Continue home inhalers    - No nebulizer treatments in the setting of COVID-19 infection    - Nicotine patch if needed   ??   5. Bipolar 1 disorder/PTSD:   Written for Lamotrigine 75 mg daily by Psychiatrist but patient states taking 50mg  at 75mg  is too high, causes sedation and confusion    - Continue Lamotrigine 50mg  daily   ??      Diet   Cardiac diet     DVT Prophylaxis   Lovenox 90 mg twice daily     GI Prophylaxis   Protonix 40 mg daily     Code status   Full     Disposition   Home     ??      Point of Contact  Mary [name]    Relationship: God-Daughter   [phone]             Benedetto Goad, DO , PGY-1     [facility]     May 05, 2020, 6:51 AM

## 2020-05-05 NOTE — Progress Notes (Signed)
 Comprehensive Nutrition Assessment    Type and Reason for Visit: Initial,Positive nutrition screen    Nutrition Recommendations/Plan:   - Add oral supplement to optimize nutrition intake: Ensure Enlive once daily    Nutrition Assessment:  Tolerating diet with fair appetite.  Oxygen levels improving and NC removed today.    Malnutrition Assessment:  Malnutrition Status:  No malnutrition      Nutrition History and Allergies: PMH: anxiety, CHF, COPD, AVR, PTSD, bipolar disorder. Admitted with acute hypoxic respiratory failure 2/2 Covid-19 infection.  Stable wt hx. Slight decrease in appetite x 1-2 days PTA per pt. NKFA.     Estimated Daily Nutrient Needs:  Energy (kcal): 8531-8237; Weight Used for Energy Requirements: Current  Protein (g): 71-89; Weight Used for Protein Requirements: Current (0.8-1)  Fluid (ml/day): 8531-8237; Method Used for Fluid Requirements: 1 ml/kcal      Nutrition Related Findings:  BM 12/19. Meds: cholecalciferol, steroid, Lasix .      Wounds:    None       Current Nutrition Therapies:  ADULT DIET Easy to Chew; Low Fat/Low Chol/High Fiber/NAS    Anthropometric Measures:   Height:  5' 3 (160 cm)   Current Body Wt:  89.4 kg (197 lb 1.5 oz)    Admission Body Wt:  197 lb 1.5 oz     Usual Body Wt:  89.4 kg (197 lb) (per pt)      Ideal Body Wt:  115 lbs:  171.4 %    BMI Category:  Obese class 1 (BMI 30.0-34.9)       Nutrition Diagnosis:    Inadequate oral intake related to acute injury/trauma as evidenced by intake 51-75%      Nutrition Interventions:   Food and/or Nutrient Delivery: Continue current diet,Start oral nutrition supplement  Nutrition Education and Counseling: No recommendations at this time,Education not indicated  Coordination of Nutrition Care: Continue to monitor while inpatient    Goals:  PO nutrition intake will meet >75% of patient estimated nutritional needs within the next 7 days.       Nutrition Monitoring and Evaluation:   Behavioral-Environmental Outcomes: None  identified  Food/Nutrient Intake Outcomes: Food and nutrient intake,Supplement intake  Physical Signs/Symptoms Outcomes: Biochemical data,Meal time behavior,Nutrition focused physical findings    Discharge Planning:    No discharge needs at this time     Electronically signed by Recardo ONEIDA Purdue, RD on 05/05/2020 at 11:50 AM    Contact: [phone]

## 2020-05-05 NOTE — Progress Notes (Signed)
Infectious Disease progress Note        Reason: COVID-19 pneumonia    Current abx Prior abx         Lines:       Assessment :  54 y.o. 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    Malaise for about one week  S/p cardiac cath about a week ago    Clinical presentation consistent with acute hypoxia-present on admission due to COVID-19 pneumonia     ABG reviewed.  PO2-68.  Persistent hypoxia likely due to covid-19 infection superimposed on underlying emphysema    Patient seems to be clinically responding to current dose of steroids.    Oxygen saturation 94 percent on room air on today's exam    Low procalcitonin argues against superimposed bacterial infection    Clinically better. Eager to go home. Improved cough. sob      Recommendations:    1.  Recommend Decadron 6 mg every 24 hours for total 10 days  2.  Evaluate for home oxygen  3. Continue diuretics per primary team    Addendum- 13:00  Patient noted to have fever with tm:102. o2 sats now 90 percent on room air at rest. Presentation concerning for worsening covid-19 associated inflammation/pneumonia. Recommend inpatient observation. Will need to increase steroids and add anti inflammatory agents if worsening hypoxia noted on future exam.      Above plan was discussed in details with patient,RN and dr Christophe Louis . Please call me if any further questions or concerns. Will continue to participate in the care of this patient.  HPI:    Patient states that she feels better today compared to yesterday.  She is wondering when she can go home.  Denies significant cough/sputum production.  Denies pleuritic chest pain, abdominal pain, diarrhea, nausea.    Past Medical History:   Diagnosis Date   ??? Anxiety    ??? Asthma    ??? Bipolar 1  disorder (HCC)    ??? CHF (congestive heart failure) (HCC)    ??? COPD (chronic obstructive pulmonary disease) (HCC)    ??? H/O aortic valve replacement    ??? PTSD (post-traumatic stress disorder)        Past Surgical History:   Procedure Laterality Date   ??? HX HEART CATHETERIZATION     ??? HX HEART VALVE SURGERY         home Medication List    Details   hydrOXYzine HCL (ATARAX) 25 mg tablet Take  by mouth nightly as needed.      enoxaparin (Lovenox) 40 mg/0.4 mL 60 mg by SubCUTAneous route every twelve (12) hours every twelve (12) hours.      cyclobenzaprine (FLEXERIL) 5 mg tablet Take 5 mg by mouth nightly as needed for Muscle Spasm(s).      acetaminophen (Tylenol Extra Strength) 500 mg tablet Take 1,000 mg by mouth every eight (8) hours as needed for Pain.      pregabalin (LYRICA) 200 mg capsule take 1 capsule by mouth three times a day  Qty: 90 Capsule, Refills: 1    Associated Diagnoses: Neck pain; Chronic pain syndrome      albuterol (PROVENTIL HFA, VENTOLIN HFA, PROAIR HFA) 90 mcg/actuation inhaler Take 2 Puffs by inhalation every six (6) hours as needed.      cetirizine (ZYRTEC) 10 mg  tablet Take 10 mg by mouth daily.      fluticasone propionate (FLONASE) 50 mcg/actuation nasal spray 1 Spray by Nasal route daily.      furosemide (LASIX) 40 mg tablet TAKE ONE TABLET BY MOUTH EVERY DAY      Combivent Respimat 20-100 mcg/actuation inhaler INHALE 1 PUFF BY MOUTH INTO LUNGS EVERY 6 HOURS      lamoTRIgine (LaMICtal) 25 mg tablet TAKE 3 TABLETS BY MOUTH ONCE DAILY      metoprolol succinate (TOPROL-XL) 25 mg XL tablet       omeprazole (PRILOSEC) 40 mg capsule Take 40 mg by mouth daily.      pravastatin (PRAVACHOL) 40 mg tablet Take 40 mg by mouth nightly.        warfarin (COUMADIN) 2.5 mg tablet TAKE 2 TABLETS BY MOUTH ONCE DAILY ON MONDAY WEDNESDAY FRIDAY AND TAKE 1 & 1 2 (ONE AND ONE HALF) ALL OTHER DAYS             Current Facility-Administered Medications   Medication Dose Route Frequency   ??? lamoTRIgine (LaMICtal)  tablet 50 mg  50 mg Oral DAILY   ??? enoxaparin (LOVENOX) injection 90 mg  90 mg SubCUTAneous Q12H   ??? acetaminophen (TYLENOL) tablet 1,000 mg  1,000 mg Oral Q8H   ??? sodium chloride (NS) flush 5-40 mL  5-40 mL IntraVENous Q8H   ??? sodium chloride (NS) flush 5-40 mL  5-40 mL IntraVENous PRN   ??? ondansetron (ZOFRAN ODT) tablet 4 mg  4 mg Oral Q8H PRN    Or   ??? ondansetron (ZOFRAN) injection 4 mg  4 mg IntraVENous Q6H PRN   ??? cholecalciferol (VITAMIN D3) (1000 Units /25 mcg) tablet 2,000 Units  2,000 Units Oral DAILY   ??? dexamethasone (DECADRON) 4 mg/mL injection 6 mg  6 mg IntraVENous Q24H   ??? cetirizine (ZYRTEC) tablet 10 mg  10 mg Oral DAILY   ??? fluticasone propionate (FLONASE) 50 mcg/actuation nasal spray 2 Spray  2 Spray Both Nostrils DAILY   ??? furosemide (LASIX) tablet 40 mg  40 mg Oral DAILY   ??? hydrOXYzine HCL (ATARAX) tablet 25 mg  25 mg Oral TID PRN   ??? pantoprazole (PROTONIX) tablet 40 mg  40 mg Oral DAILY   ??? pravastatin (PRAVACHOL) tablet 40 mg  40 mg Oral QHS   ??? pregabalin (LYRICA) capsule 200 mg  200 mg Oral TID   ??? metoprolol succinate (TOPROL-XL) XL tablet 25 mg  25 mg Oral DAILY   ??? albuterol (PROVENTIL HFA, VENTOLIN HFA, PROAIR HFA) inhaler 2 Puff  2 Puff Inhalation Q6H PRN   ??? ipratropium-albuterol (COMBIVENT RESPIMAT) 20 mcg-100 mcg inhalation spray  1 Puff Inhalation Q6H RT   ??? Warfarin - Pharmacy to Dose   Other Rx Dosing/Monitoring       Allergies: Adhesive and Lithium    Family History   Problem Relation Age of Onset   ??? No Known Problems Mother      Social History     Socioeconomic History   ??? Marital status: DIVORCED     Spouse name: Not on file   ??? Number of children: Not on file   ??? Years of education: Not on file   ??? Highest education level: Not on file   Occupational History   ??? Not on file   Tobacco Use   ??? Smoking status: Current Every Day Smoker     Packs/day: 1.00   ??? Smokeless tobacco: Never Used   Substance  and Sexual Activity   ??? Alcohol use: Never   ??? Drug use: Yes     Types:  Marijuana   ??? Sexual activity: Not on file   Other Topics Concern   ??? Not on file   Social History Narrative   ??? Not on file     Social Determinants of Health     Financial Resource Strain:    ??? Difficulty of Paying Living Expenses: Not on file   Food Insecurity:    ??? Worried About Running Out of Food in the Last Year: Not on file   ??? Ran Out of Food in the Last Year: Not on file   Transportation Needs:    ??? Lack of Transportation (Medical): Not on file   ??? Lack of Transportation (Non-Medical): Not on file   Physical Activity:    ??? Days of Exercise per Week: Not on file   ??? Minutes of Exercise per Session: Not on file   Stress:    ??? Feeling of Stress : Not on file   Social Connections:    ??? Frequency of Communication with Friends and Family: Not on file   ??? Frequency of Social Gatherings with Friends and Family: Not on file   ??? Attends Religious Services: Not on file   ??? Active Member of Clubs or Organizations: Not on file   ??? Attends Banker Meetings: Not on file   ??? Marital Status: Not on file   Intimate Partner Violence:    ??? Fear of Current or Ex-Partner: Not on file   ??? Emotionally Abused: Not on file   ??? Physically Abused: Not on file   ??? Sexually Abused: Not on file   Housing Stability:    ??? Unable to Pay for Housing in the Last Year: Not on file   ??? Number of Places Lived in the Last Year: Not on file   ??? Unstable Housing in the Last Year: Not on file     Social History     Tobacco Use   Smoking Status Current Every Day Smoker   ??? Packs/day: 1.00   Smokeless Tobacco Never Used        Temp (24hrs), Avg:99.3 ??F (37.4 ??C), Min:98.4 ??F (36.9 ??C), Max:100.1 ??F (37.8 ??C)    Visit Vitals  BP 120/72 (BP 1 Location: Right upper arm, BP Patient Position: At rest;Sitting)   Pulse 71   Temp 98.4 ??F (36.9 ??C)   Resp 16   Ht 5\' 3"  (1.6 m)   Wt 89.4 kg (197 lb)   SpO2 93% Comment: room air   Breastfeeding No   BMI 34.90 kg/m??       ROS: 12 point ROS obtained in details. Pertinent positives as mentioned in HPI,    otherwise negative    Physical Exam:    Vitals signs and nursing note reviewed.   Constitutional:    Sitting on bed.  Alert awake oriented x3, appears comfortable  HENT:      Head: Normocephalic.   Eyes:      Conjunctiva/sclera: Conjunctivae normal.      Neck:      Musculoskeletal: Normal range of motion and neck supple.   Cardiovascular:      Rate and Rhythm: Normal rate and regular rhythm on monitor  Chest:      Bilateral chest movements equal.  Auscultation deferred due to Covid positive  Abdominal:      General: There is no distension.  Palpations: Abdomen is soft.      Tenderness: There is no abdominal tenderness. There is no rebound.   Musculoskeletal: Normal range of motion.         General: No tenderness.   Skin:     General: Skin is warm and dry.      Findings: No rash.   Neurological:      Mental Status:  alert and oriented to person, place, and time.      Cranial Nerves: No cranial nerve deficit.      Motor: No abnormal muscle tone.      Coordination: Coordination normal.   Psychiatric:         Behavior: Behavior normal.         Thought Content: Thought content normal.         Judgment: Judgment normal.     Labs: Results:   Chemistry Recent Labs     05/05/20  0403 05/04/20  2149 05/04/20  1048 05/03/20  0940 05/03/20  0940   GLU 102* 126* 88   < > 98   NA 137 139 137   < > 140   K 3.9 4.5 3.3*   < > 3.6   CL 107 105 107   < > 103   CO2 25 29 25    < > 27   BUN 13 13 12    < > 8   CREA 0.78 0.98 0.76   < > 0.99   CA 8.9 9.3 9.0   < > 9.2   AGAP 5 5 5    < > 10   BUCR 17 13 16    < > 8*   AP  --   --   --   --  55   TP  --   --   --   --  7.3   ALB  --   --   --   --  4.2   GLOB  --   --   --   --  3.1   AGRAT  --   --   --   --  1.4    < > = values in this interval not displayed.      CBC w/Diff Recent Labs     05/05/20  0403 05/04/20  1048 05/03/20  0940   WBC 3.0* 3.2* 2.7*   RBC 4.71 4.54 4.75   HGB 13.7 13.1 13.9   HCT 42.9 41.5 43.1   PLT 185 175 200   GRANS 62 56 67   LYMPH 28 32 16*   EOS 0 0 0       Microbiology No results for input(s): CULT in the last 72 hours.       RADIOLOGY:    All available imaging studies/reports in connect care for this admission were reviewed  High complexity decision making was performed during the evaluation of this patient at high risk for decompensation with multiple organ involvement         Disclaimer: Sections of this note are dictated utilizing voice recognition software, which may have resulted in some phonetic based errors in grammar and contents. Even though attempts were made to correct all the mistakes, some may have been missed, and remained in the body of the document. If questions arise, please contact our department.    Dr. Raiford SimmondsManali Antinette Keough, Infectious Disease Specialist  [phone]  May 05, 2020  8:54 AM

## 2020-05-05 NOTE — Progress Notes (Signed)
Problem: Patient Education: Go to Patient Education Activity  Goal: Patient/Family Education  Outcome: Progressing Towards Goal     Problem: Patient Education: Go to Patient Education Activity  Goal: Patient/Family Education  Outcome: Progressing Towards Goal     Problem: Diabetes Self-Management  Goal: *Disease process and treatment process  Description: Define diabetes and identify own type of diabetes; list 3 options for treating diabetes.  Outcome: Progressing Towards Goal  Goal: *Incorporating nutritional management into lifestyle  Description: Describe effect of type, amount and timing of food on blood glucose; list 3 methods for planning meals.  Outcome: Progressing Towards Goal  Goal: *Incorporating physical activity into lifestyle  Description: State effect of exercise on blood glucose levels.  Outcome: Progressing Towards Goal  Goal: *Developing strategies to promote health/change behavior  Description: Define the ABC's of diabetes; identify appropriate screenings, schedule and personal plan for screenings.  Outcome: Progressing Towards Goal  Goal: *Using medications safely  Description: State effect of diabetes medications on diabetes; name diabetes medication taking, action and side effects.  Outcome: Progressing Towards Goal  Goal: *Monitoring blood glucose, interpreting and using results  Description: Identify recommended blood glucose targets  and personal targets.  Outcome: Progressing Towards Goal  Goal: *Prevention, detection, treatment of acute complications  Description: List symptoms of hyper- and hypoglycemia; describe how to treat low blood sugar and actions for lowering  high blood glucose level.  Outcome: Progressing Towards Goal  Goal: *Prevention, detection and treatment of chronic complications  Description: Define the natural course of diabetes and describe the relationship of blood glucose levels to long term complications of diabetes.  Outcome: Progressing Towards Goal  Goal:  *Developing strategies to address psychosocial issues  Description: Describe feelings about living with diabetes; identify support needed and support network  Outcome: Progressing Towards Goal  Goal: *Insulin pump training  Outcome: Progressing Towards Goal  Goal: *Sick day guidelines  Outcome: Progressing Towards Goal  Goal: *Patient Specific Goal (EDIT GOAL, INSERT TEXT)  Outcome: Progressing Towards Goal     Problem: Pain  Goal: *Control of Pain  Outcome: Progressing Towards Goal  Goal: *PALLIATIVE CARE:  Alleviation of Pain  Outcome: Progressing Towards Goal     Problem: Patient Education: Go to Patient Education Activity  Goal: Patient/Family Education  Outcome: Progressing Towards Goal     Problem: Airway Clearance - Ineffective  Goal: Achieve or maintain patent airway  Outcome: Progressing Towards Goal     Problem: Isolation Precautions - Risk of Spread of Infection  Goal: Prevent transmission of infectious organism to others  Outcome: Progressing Towards Goal     Problem: Nutrition Deficits  Goal: Optimize nutrtional status  Outcome: Progressing Towards Goal     Problem: Risk for Fluid Volume Deficit  Goal: Maintain normal heart rhythm  Outcome: Progressing Towards Goal  Goal: Maintain absence of muscle cramping  Outcome: Progressing Towards Goal  Goal: Maintain normal serum potassium, sodium, calcium, phosphorus, and pH  Outcome: Progressing Towards Goal     Problem: Gas Exchange - Impaired  Goal: *Absence of hypoxia  Outcome: Progressing Towards Goal     Problem: Patient Education: Go to Patient Education Activity  Goal: Patient/Family Education  Outcome: Progressing Towards Goal     Problem: Patient Education: Go to Patient Education Activity  Goal: Patient/Family Education  Outcome: Progressing Towards Goal

## 2020-05-05 NOTE — Progress Notes (Signed)
 Pharmacist Consult: Warfarin Management    Assessment/Plan  1. INR is subtherapeutic: 1.6  2. Will give warfarin 3.75 mg as a one-time dose.   3. Recheck INR daily and adjust dose accordingly. Will continue to follow INR trend and patient's clinical progress daily.     Subjective/Objective  1. Patient on warfarin therapy for Atrial Fibrillation.   2. Target goal INR of 2 - 3  3. Patient is has been on warfarin PTA. Patient's home dose is 5 mg MWF, 3.75 mg TTSS.   4. Patient was here last week for cardiac cath. She was sent home with warfarin + lovenox  bridge. Patient INR low at 1.3, compliance vs. Need for increased dose. She has been same dose since ~10/21.     Date 12/19 12/20 12/21        INR 1.3 1.3 1.6       Warfarin Dose (mg) 5 mg 5 mg 3.75 mg           Bleeding/Sensitivity Risk Factors: Hx variable INRs, Recent Trauma or Surgery, and Hypertension  Potentially interacting medications include:   SABRA Drugs that may increase INR: None  . Drugs that may decrease INR: None  Other anticoagulants include: Enoxaparin     Thank you,  Wilburn Cassis, [facility]  05/05/2020

## 2020-05-06 ENCOUNTER — Ambulatory Visit: Attending: Family | Primary: Family Medicine

## 2020-05-06 ENCOUNTER — Inpatient Hospital Stay: Admit: 2020-05-06 | Payer: MEDICARE | Primary: Family Medicine

## 2020-05-06 LAB — METABOLIC PANEL, BASIC
Anion gap: 9 mmol/L (ref 3.0–18)
BUN/Creatinine ratio: 22 — ABNORMAL HIGH (ref 12–20)
BUN: 19 MG/DL — ABNORMAL HIGH (ref 7.0–18)
CO2: 22 mmol/L (ref 21–32)
Calcium: 8.7 MG/DL (ref 8.5–10.1)
Chloride: 105 mmol/L (ref 100–111)
Creatinine: 0.88 MG/DL (ref 0.6–1.3)
GFR est AA: >60 mL/min/{1.73_m2} (ref >60)
GFR est non-AA: >60 mL/min/{1.73_m2} (ref >60)
Glucose: 96 mg/dL (ref 74–99)
Potassium: 3.6 mmol/L (ref 3.5–5.5)
Sodium: 136 mmol/L (ref 136–145)

## 2020-05-06 LAB — CBC WITH AUTOMATED DIFF
ABS. BASOPHILS: 0 10*3/uL (ref 0.0–0.1)
ABS. EOSINOPHILS: 0 10*3/uL (ref 0.0–0.4)
ABS. IMM. GRANS.: 0 10*3/uL (ref 0.00–0.04)
ABS. LYMPHOCYTES: 1.1 10*3/uL (ref 0.9–3.6)
ABS. MONOCYTES: 0.3 10*3/uL (ref 0.05–1.2)
ABS. NEUTROPHILS: 1.3 10*3/uL — ABNORMAL LOW (ref 1.8–8.0)
ABSOLUTE NRBC: 0 10*3/uL (ref 0.00–0.01)
BASOPHILS: 0 % (ref 0–2)
EOSINOPHILS: 0 % (ref 0–5)
HCT: 45.2 % — ABNORMAL HIGH (ref 35.0–45.0)
HGB: 14.7 g/dL (ref 12.0–16.0)
IMMATURE GRANULOCYTES: 0 % (ref 0.0–0.5)
LYMPHOCYTES: 42 % (ref 21–52)
MCH: 29.3 PG (ref 24.0–34.0)
MCHC: 32.5 g/dL (ref 31.0–37.0)
MCV: 90.2 FL (ref 78.0–100.0)
MONOCYTES: 10 % (ref 3–10)
MPV: 10.5 FL (ref 9.2–11.8)
NEUTROPHILS: 48 % (ref 40–73)
NRBC: 0 PER 100 WBC
PLATELET: 175 10*3/uL (ref 135–420)
RBC: 5.01 M/uL (ref 4.20–5.30)
RDW: 13.4 % (ref 11.6–14.5)
WBC: 2.7 10*3/uL — ABNORMAL LOW (ref 4.6–13.2)

## 2020-05-06 LAB — PROCALCITONIN
Procalcitonin: <0.05 ng/mL
Procalcitonin: <0.05 ng/mL

## 2020-05-06 LAB — D DIMER: D DIMER: 0.28 ug/ml(FEU) (ref <0.46)

## 2020-05-06 LAB — PROTHROMBIN TIME + INR
INR: 1.7 — ABNORMAL HIGH (ref 0.8–1.2)
Prothrombin time: 19.8 s — ABNORMAL HIGH (ref 11.5–15.2)

## 2020-05-06 LAB — C REACTIVE PROTEIN, QT: C-Reactive protein: 0.5 mg/dL — ABNORMAL HIGH (ref 0–0.3)

## 2020-05-06 LAB — FERRITIN
Ferritin: 180 NG/ML (ref 8–388)
Ferritin: 180 NG/ML (ref 8–388)

## 2020-05-06 LAB — BASIC METABOLIC PANEL
Anion Gap: 9 mmol/L (ref 3.0–18)
BUN: 19 MG/DL — ABNORMAL HIGH (ref 7.0–18)
Bun/Cre Ratio: 22 — ABNORMAL HIGH (ref 12–20)
CO2: 22 mmol/L (ref 21–32)
Calcium: 8.7 MG/DL (ref 8.5–10.1)
Chloride: 105 mmol/L (ref 100–111)
Creatinine: 0.88 MG/DL (ref 0.6–1.3)
EGFR IF NonAfrican American: >60 mL/min/{1.73_m2} (ref >60)
GFR African American: >60 mL/min/{1.73_m2} (ref >60)
Glucose: 96 mg/dL (ref 74–99)
Potassium: 3.6 mmol/L (ref 3.5–5.5)
Sodium: 136 mmol/L (ref 136–145)

## 2020-05-06 LAB — CBC WITH AUTO DIFFERENTIAL
Basophils %: 0 % (ref 0–2)
Basophils Absolute: 0 10*3/uL (ref 0.0–0.1)
Eosinophils %: 0 % (ref 0–5)
Eosinophils Absolute: 0 10*3/uL (ref 0.0–0.4)
Granulocyte Absolute Count: 0 10*3/uL (ref 0.00–0.04)
Hematocrit: 45.2 % — ABNORMAL HIGH (ref 35.0–45.0)
Hemoglobin: 14.7 g/dL (ref 12.0–16.0)
Immature Granulocytes: 0 % (ref 0.0–0.5)
Lymphocytes %: 42 % (ref 21–52)
Lymphocytes Absolute: 1.1 10*3/uL (ref 0.9–3.6)
MCH: 29.3 PG (ref 24.0–34.0)
MCHC: 32.5 g/dL (ref 31.0–37.0)
MCV: 90.2 FL (ref 78.0–100.0)
MPV: 10.5 FL (ref 9.2–11.8)
Monocytes %: 10 % (ref 3–10)
Monocytes Absolute: 0.3 10*3/uL (ref 0.05–1.2)
NRBC Absolute: 0 10*3/uL (ref 0.00–0.01)
Neutrophils %: 48 % (ref 40–73)
Neutrophils Absolute: 1.3 10*3/uL — ABNORMAL LOW (ref 1.8–8.0)
Nucleated RBCs: 0 PER 100 WBC
Platelets: 175 10*3/uL (ref 135–420)
RBC: 5.01 M/uL (ref 4.20–5.30)
RDW: 13.4 % (ref 11.6–14.5)
WBC: 2.7 10*3/uL — ABNORMAL LOW (ref 4.6–13.2)

## 2020-05-06 LAB — PROTIME-INR
INR: 1.7 — ABNORMAL HIGH (ref 0.8–1.2)
Protime: 19.8 s — ABNORMAL HIGH (ref 11.5–15.2)

## 2020-05-06 LAB — C-REACTIVE PROTEIN: CRP: 0.5 mg/dL — ABNORMAL HIGH (ref 0–0.3)

## 2020-05-06 LAB — D-DIMER, QUANTITATIVE: D-Dimer, Quant: 0.28 ug/ml(FEU) (ref <0.46)

## 2020-05-06 MED ORDER — DEXTROMETHORPHAN-GUAIFENESIN 10 MG-100 MG/5 ML SYRUP
100-10 mg/5 mL | Freq: Four times a day (QID) | ORAL | Status: DC
Start: 2020-05-06 — End: 2020-05-15
  Administered 2020-05-06 – 2020-05-15 (×35): via ORAL

## 2020-05-06 MED ORDER — SODIUM CHLORIDE 0.9 % IV PIGGY BACK
100 mg | Freq: Two times a day (BID) | INTRAVENOUS | Status: DC
Start: 2020-05-06 — End: 2020-05-12
  Administered 2020-05-06 – 2020-05-13 (×12): via INTRAVENOUS

## 2020-05-06 MED ORDER — IBUPROFEN 600 MG TAB
600 mg | Freq: Four times a day (QID) | ORAL | Status: DC | PRN
Start: 2020-05-06 — End: 2020-05-06

## 2020-05-06 MED ORDER — IBUPROFEN 600 MG TAB
600 mg | Freq: Four times a day (QID) | ORAL | Status: AC
Start: 2020-05-06 — End: 2020-05-07
  Administered 2020-05-06 – 2020-05-07 (×4): via ORAL

## 2020-05-06 MED ORDER — WARFARIN 5 MG TAB
5 mg | Freq: Every evening | ORAL | Status: AC
Start: 2020-05-06 — End: 2020-05-06
  Administered 2020-05-06: 23:00:00 via ORAL

## 2020-05-06 MED ORDER — DOXYCYCLINE HYCLATE 100 MG CAP
100 mg | Freq: Two times a day (BID) | ORAL | Status: DC
Start: 2020-05-06 — End: 2020-05-06

## 2020-05-06 MED FILL — WARFARIN 5 MG TAB: 5 mg | ORAL | Qty: 1

## 2020-05-06 MED FILL — ACETAMINOPHEN 500 MG TAB: 500 mg | ORAL | Qty: 2

## 2020-05-06 MED FILL — PREGABALIN 50 MG CAP: 50 mg | ORAL | Qty: 1

## 2020-05-06 MED FILL — DOXYCYCLINE HYCLATE 100 MG IV SOLUTION: 100 mg | INTRAVENOUS | Qty: 100

## 2020-05-06 MED FILL — LAMOTRIGINE 25 MG TAB: 25 mg | ORAL | Qty: 2

## 2020-05-06 MED FILL — PRAVASTATIN 20 MG TAB: 20 mg | ORAL | Qty: 2

## 2020-05-06 MED FILL — ENOXAPARIN 100 MG/ML SUB-Q SYRINGE: 100 mg/mL | SUBCUTANEOUS | Qty: 1

## 2020-05-06 MED FILL — DEXTROMETHORPHAN-GUAIFENESIN 10 MG-100 MG/5 ML SYRUP: 100-10 mg/5 mL | ORAL | Qty: 10

## 2020-05-06 MED FILL — FUROSEMIDE 40 MG TAB: 40 mg | ORAL | Qty: 1

## 2020-05-06 MED FILL — IBUPROFEN 600 MG TAB: 600 mg | ORAL | Qty: 1

## 2020-05-06 MED FILL — CETIRIZINE 10 MG TAB: 10 mg | ORAL | Qty: 1

## 2020-05-06 MED FILL — CHOLECALCIFEROL (VITAMIN D3) 1,000 UNIT (25 MCG) TAB: ORAL | Qty: 2

## 2020-05-06 MED FILL — METOPROLOL SUCCINATE SR 25 MG 24 HR TAB: 25 mg | ORAL | Qty: 1

## 2020-05-06 MED FILL — DEXAMETHASONE SODIUM PHOSPHATE 4 MG/ML IJ SOLN: 4 mg/mL | INTRAMUSCULAR | Qty: 2

## 2020-05-06 MED FILL — PANTOPRAZOLE 40 MG TAB, DELAYED RELEASE: 40 mg | ORAL | Qty: 1

## 2020-05-06 NOTE — Progress Notes (Signed)
Patient has temp of 101. Paged PFM, physician aware, no orders received.

## 2020-05-06 NOTE — Progress Notes (Signed)
Referral for [facility] was alerted to [facility] and [facility], [facility] confirmed acceptance. Will place in queue upon receiving the order.    Roseanne Kaufman, RN Case Management

## 2020-05-06 NOTE — Progress Notes (Signed)
Progress Notes by Benedetto Goad, DO at 05/06/20 9470                Author: Benedetto Goad, DO  Service: FAMILY MEDICINE  Author Type: Resident       Filed: 05/06/20 0645  Date of Service: 05/06/20 0635  Status: Attested           Editor: Benedetto Goad, DO (Resident)  Cosigner: Caryl Ada, MD at 05/06/20 1256          Attestation signed by Caryl Ada, MD at 05/06/20 1256                 I have seen and examined the patient. I reviewed the resident's note and agree with below.     Developed temperature. Not feeling well today. Still has cough with small amounts of sputum.      Caryl Ada, MD   05/06/2020, 12:55 PM                                       Intern Progress Note   [facility]              Patient: Mary [name]  MRN: [id number]   CSN: [id number]          Date of Birth: 1965-12-10   Age: 54 y.o.   Sex: female          DOA: 05/03/2020  LOS:  LOS: 3 days                          Subjective:            No events overnight.        Patient seen and examined this morning.  Having some pain on right side of face with oxygen tubing is rubbing.  She slept most of the day yesterday, was OOB.  She states not feeling as well as she did a few days ago.  Wishes to change clothes and wash  up.  Having more productive cough over past day.        Review of Systems    Constitutional: Negative for chills, fever and malaise/fatigue.    Respiratory: Positive for cough. Negative for hemoptysis, sputum production, shortness of breath and wheezing.     Cardiovascular: Negative for chest pain, palpitations, orthopnea and leg swelling.    Gastrointestinal: Negative for abdominal pain, constipation, diarrhea, heartburn, nausea and vomiting.    Musculoskeletal: Negative for myalgias.    Skin: Negative for rash.    Neurological: Negative for headaches.            Objective:        Patient Vitals for the past 12 hrs:            Temp  Pulse  Resp  BP  SpO2             05/06/20 0300  97.5 ??F (36.4 ??C)  78  16  115/77  92 %            05/05/20 1925  99.8 ??F (37.7 ??C)  83  14  93/60  92 %            No intake or output data in the 24 hours ending 05/06/20 0645      Physical Exam:    General: Lying  in bed, NAD, AOx3, NC in place    CV:  RRR, no M/G/R   RESP: Unlabored breathing. Wheezing improved.  No rhonchi and rales. Equal expansion bilaterally.    ABD:  Soft, nontender, nondistended. No hepatosplenomegaly.    MS:  No joint deformity or instability. No atrophy.   Ext:  No edema.  2+ radial and dp pulses bilaterally.   Skin: Warm & dry. No rashes, lesions, or ulcers.  Good turgor.    Psych: normal mood and affect       Lab/Data Reviewed:   No results found for this or any previous visit (from the past 24 hour(s)).        Assessment & Plan:        This is a 54 y.o. 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 Febrile over the past 24 hours, having more cough and fatigue, requiring 2L NC starting yesterday afternoon.   Will monitor  throughout today, may increase steroids or add additional medications pending on ID evaluation/opinion today.     ??   1. Acute Hypoxic Respiratory failure 2/2 COVID-19 PNA:    Stable, she is back on 2LNC from yesterday afternoon, having fevers yesterday with increased cough and sputum.      - Supplemental oxygen as needed, wean as tolerated    - PT/OT/CM    - Infectious disease following, appreciate recommendations     - Consider additional anti-inflammatories today    - Trend CBC, BMP, CRP, D-dimer, Ferritin, Procal    - Continue dexamethasone IV 6 mg daily    - Contact precautions     - Continue home inhalers at this time     - prior to disposition    ??   2. pAfib currently NSR    - Continue Lovenox  BID until INR therapeutic (goal 2-3)     - INR 1.6 yesterday    - Pharmacy recommendations reviewed, appreciate assistance     - Continue Metoprolol 25 mg daily    ??   3. Bioprosthetic mitral valve replacement/Maze procedure/LAA appendage ligation in 2017/stable angina pectoris(s/p LHC):   LHC significant for Prox RCA lesion 30% stenosed.  Otherwise unremarkable.  BNP 1579, negative high-sensitivity troponin x 2    - Continue Lasix 40 mg daily    - TTE results reviewed     - Follow Troponin q6hrs 3 times   ??   4. Tobacco dependence/questionable COPD:   Patient with 40-pack-year smoking history.  Current every day smoker.    - Continue home inhalers    - No nebulizer treatments in the setting of COVID-19 infection    - Nicotine patch if needed   ??   5. Bipolar 1 disorder/PTSD:   Written for Lamotrigine 75 mg daily by Psychiatrist but patient states taking 50mg  at 75mg  is too high, causes sedation and confusion    - Continue Lamotrigine 50mg  daily   ??      Diet   Cardiac diet     DVT Prophylaxis   Lovenox 90 mg twice daily     GI Prophylaxis   Protonix 40 mg daily     Code status   Full     Disposition   Home     ??      Point of  Contact  Jerrica Bulluck    Relationship: God-Daughter   [phone]             Benedetto Goad, DO , PGY-1     [facility]     May 06, 2020, 6:51 AM

## 2020-05-06 NOTE — Progress Notes (Signed)
Infectious Disease progress Note        Reason: COVID-19 pneumonia    Current abx Prior abx         Lines:       Assessment :  54 y.o. 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    Malaise for about one week  S/p cardiac cath about a week ago    Clinical presentation consistent with acute hypoxia-present on admission due to COVID-19 pneumonia     ABG reviewed.  PO2-68.  Persistent hypoxia likely due to covid-19 infection superimposed on underlying emphysema    Increasing oxygen requirement, increased cough.  Currently on 2 L oxygen.    Worsening hypoxia could be secondary to ongoing COVID-19 pneumonia/? superimposed bacterial pneumonia    Low procalcitonin argues against superimposed bacterial infection.  Cannot exclude superimposed gram-positive infection/atypical community-acquired pneumonia infection with full certainty (procalcitonin can remain low in these infections)      Recommendations:    1.  Recommend Decadron 6 mg every 24 hours   2.  Add doxycycline, Robitussin-DM. Obtain sputum cultures  3.  Repeat chest x-ray to evaluate for worsening infiltrates, effusions  4. Continue diuretics per primary team  5.  Continue therapeutic Lovenox  6.  Monitor CRP, D-dimer, procalcitonin  7.  Titrate oxygen as tolerated    I am away till 05/10/2020.  Please call me at [phone] if any questions/concerns in the interim     Above plan was discussed in details with patient,RN and dr Christophe Louis . Please call me if any further questions or concerns. Will continue to participate in the care of this patient.  HPI:  States that she does not feel too good.  Complains of feeling tired.   Denies pleuritic chest pain, abdominal pain, diarrhea, nausea.    Past Medical History:   Diagnosis  Date   ??? Anxiety    ??? Asthma    ??? Bipolar 1 disorder (HCC)    ??? CHF (congestive heart failure) (HCC)    ??? COPD (chronic obstructive pulmonary disease) (HCC)    ??? H/O aortic valve replacement    ??? PTSD (post-traumatic stress disorder)        Past Surgical History:   Procedure Laterality Date   ??? HX HEART CATHETERIZATION     ??? HX HEART VALVE SURGERY         home Medication List    Details   hydrOXYzine HCL (ATARAX) 25 mg tablet Take  by mouth nightly as needed.      enoxaparin (Lovenox) 40 mg/0.4 mL 60 mg by SubCUTAneous route every twelve (12) hours every twelve (12) hours.      cyclobenzaprine (FLEXERIL) 5 mg tablet Take 5 mg by mouth nightly as needed for Muscle Spasm(s).      acetaminophen (Tylenol Extra Strength) 500 mg tablet Take 1,000 mg by mouth every eight (8) hours as needed for Pain.      pregabalin (LYRICA) 200 mg capsule take 1 capsule by mouth three times a day  Qty: 90 Capsule, Refills: 1    Associated Diagnoses: Neck pain; Chronic pain syndrome      albuterol (PROVENTIL HFA, VENTOLIN HFA, PROAIR HFA) 90 mcg/actuation inhaler Take 2 Puffs by inhalation every six (6) hours as needed.      cetirizine (ZYRTEC) 10 mg tablet Take 10  mg by mouth daily.      fluticasone propionate (FLONASE) 50 mcg/actuation nasal spray 1 Spray by Nasal route daily.      furosemide (LASIX) 40 mg tablet TAKE ONE TABLET BY MOUTH EVERY DAY      Combivent Respimat 20-100 mcg/actuation inhaler INHALE 1 PUFF BY MOUTH INTO LUNGS EVERY 6 HOURS      lamoTRIgine (LaMICtal) 25 mg tablet TAKE 3 TABLETS BY MOUTH ONCE DAILY      metoprolol succinate (TOPROL-XL) 25 mg XL tablet       omeprazole (PRILOSEC) 40 mg capsule Take 40 mg by mouth daily.      pravastatin (PRAVACHOL) 40 mg tablet Take 40 mg by mouth nightly.        warfarin (COUMADIN) 2.5 mg tablet TAKE 2 TABLETS BY MOUTH ONCE DAILY ON MONDAY WEDNESDAY FRIDAY AND TAKE 1 & 1 2 (ONE AND ONE HALF) ALL OTHER DAYS             Current Facility-Administered Medications   Medication Dose  Route Frequency   ??? lamoTRIgine (LaMICtal) tablet 50 mg  50 mg Oral DAILY   ??? enoxaparin (LOVENOX) injection 90 mg  90 mg SubCUTAneous Q12H   ??? acetaminophen (TYLENOL) tablet 1,000 mg  1,000 mg Oral Q8H   ??? sodium chloride (NS) flush 5-40 mL  5-40 mL IntraVENous Q8H   ??? sodium chloride (NS) flush 5-40 mL  5-40 mL IntraVENous PRN   ??? ondansetron (ZOFRAN ODT) tablet 4 mg  4 mg Oral Q8H PRN    Or   ??? ondansetron (ZOFRAN) injection 4 mg  4 mg IntraVENous Q6H PRN   ??? cholecalciferol (VITAMIN D3) (1000 Units /25 mcg) tablet 2,000 Units  2,000 Units Oral DAILY   ??? dexamethasone (DECADRON) 4 mg/mL injection 6 mg  6 mg IntraVENous Q24H   ??? cetirizine (ZYRTEC) tablet 10 mg  10 mg Oral DAILY   ??? fluticasone propionate (FLONASE) 50 mcg/actuation nasal spray 2 Spray  2 Spray Both Nostrils DAILY   ??? furosemide (LASIX) tablet 40 mg  40 mg Oral DAILY   ??? hydrOXYzine HCL (ATARAX) tablet 25 mg  25 mg Oral TID PRN   ??? pantoprazole (PROTONIX) tablet 40 mg  40 mg Oral DAILY   ??? pravastatin (PRAVACHOL) tablet 40 mg  40 mg Oral QHS   ??? pregabalin (LYRICA) capsule 200 mg  200 mg Oral TID   ??? metoprolol succinate (TOPROL-XL) XL tablet 25 mg  25 mg Oral DAILY   ??? albuterol (PROVENTIL HFA, VENTOLIN HFA, PROAIR HFA) inhaler 2 Puff  2 Puff Inhalation Q6H PRN   ??? ipratropium-albuterol (COMBIVENT RESPIMAT) 20 mcg-100 mcg inhalation spray  1 Puff Inhalation Q6H RT   ??? Warfarin - Pharmacy to Dose   Other Rx Dosing/Monitoring       Allergies: Adhesive and Lithium    Family History   Problem Relation Age of Onset   ??? No Known Problems Mother      Social History     Socioeconomic History   ??? Marital status: DIVORCED     Spouse name: Not on file   ??? Number of children: Not on file   ??? Years of education: Not on file   ??? Highest education level: Not on file   Occupational History   ??? Not on file   Tobacco Use   ??? Smoking status: Current Every Day Smoker     Packs/day: 1.00   ??? Smokeless tobacco: Never Used   Substance and Sexual Activity   ???  Alcohol  use: Never   ??? Drug use: Yes     Types: Marijuana   ??? Sexual activity: Not on file   Other Topics Concern   ??? Not on file   Social History Narrative   ??? Not on file     Social Determinants of Health     Financial Resource Strain:    ??? Difficulty of Paying Living Expenses: Not on file   Food Insecurity:    ??? Worried About Running Out of Food in the Last Year: Not on file   ??? Ran Out of Food in the Last Year: Not on file   Transportation Needs:    ??? Lack of Transportation (Medical): Not on file   ??? Lack of Transportation (Non-Medical): Not on file   Physical Activity:    ??? Days of Exercise per Week: Not on file   ??? Minutes of Exercise per Session: Not on file   Stress:    ??? Feeling of Stress : Not on file   Social Connections:    ??? Frequency of Communication with Friends and Family: Not on file   ??? Frequency of Social Gatherings with Friends and Family: Not on file   ??? Attends Religious Services: Not on file   ??? Active Member of Clubs or Organizations: Not on file   ??? Attends Banker Meetings: Not on file   ??? Marital Status: Not on file   Intimate Partner Violence:    ??? Fear of Current or Ex-Partner: Not on file   ??? Emotionally Abused: Not on file   ??? Physically Abused: Not on file   ??? Sexually Abused: Not on file   Housing Stability:    ??? Unable to Pay for Housing in the Last Year: Not on file   ??? Number of Places Lived in the Last Year: Not on file   ??? Unstable Housing in the Last Year: Not on file     Social History     Tobacco Use   Smoking Status Current Every Day Smoker   ??? Packs/day: 1.00   Smokeless Tobacco Never Used        Temp (24hrs), Avg:100.1 ??F (37.8 ??C), Min:97.5 ??F (36.4 ??C), Max:102.2 ??F (39 ??C)    Visit Vitals  BP 115/77 (BP 1 Location: Right upper arm, BP Patient Position: At rest;Sitting)   Pulse 78   Temp 97.5 ??F (36.4 ??C)   Resp 16   Ht 5\' 3"  (1.6 m)   Wt 85.5 kg (188 lb 8 oz)   SpO2 92%   Breastfeeding No   BMI 33.39 kg/m??       ROS: 12 point ROS obtained in details. Pertinent  positives as mentioned in HPI,   otherwise negative    Physical Exam:    Vitals signs and nursing note reviewed.   Constitutional:    Sitting on bed.  Alert awake oriented x3, appears tired  HENT:      Head: Normocephalic.   Eyes:      Conjunctiva/sclera: Conjunctivae normal.      Neck:      Musculoskeletal: Normal range of motion and neck supple.   Cardiovascular:      Rate and Rhythm: Normal rate and regular rhythm on monitor  Chest:      Bilateral chest movements equal.  Auscultation deferred due to Covid positive  Abdominal:      General: There is no distension.      Palpations: Abdomen is soft.  Tenderness: There is no abdominal tenderness. There is no rebound.   Musculoskeletal: Normal range of motion.         General: No tenderness.   Skin:     General: Skin is warm and dry.      Findings: No rash.   Neurological:      Mental Status:  alert and oriented to person, place, and time.      Cranial Nerves: No cranial nerve deficit.      Motor: No abnormal muscle tone.      Coordination: Coordination normal.   Psychiatric:         Behavior: Behavior normal.         Thought Content: Thought content normal.         Judgment: Judgment normal.     Labs: Results:   Chemistry Recent Labs     05/05/20  0403 05/04/20  2149 05/04/20  1048 05/03/20  0940 05/03/20  0940   GLU 102* 126* 88   < > 98   NA 137 139 137   < > 140   K 3.9 4.5 3.3*   < > 3.6   CL 107 105 107   < > 103   CO2 25 29 25    < > 27   BUN 13 13 12    < > 8   CREA 0.78 0.98 0.76   < > 0.99   CA 8.9 9.3 9.0   < > 9.2   AGAP 5 5 5    < > 10   BUCR 17 13 16    < > 8*   AP  --   --   --   --  55   TP  --   --   --   --  7.3   ALB  --   --   --   --  4.2   GLOB  --   --   --   --  3.1   AGRAT  --   --   --   --  1.4    < > = values in this interval not displayed.      CBC w/Diff Recent Labs     05/06/20  0534 05/05/20  0403 05/04/20  1048   WBC 2.7* 3.0* 3.2*   RBC 5.01 4.71 4.54   HGB 14.7 13.7 13.1   HCT 45.2* 42.9 41.5   PLT 175 185 175   GRANS 48 62 56    LYMPH 42 28 32   EOS 0 0 0      Microbiology No results for input(s): CULT in the last 72 hours.       RADIOLOGY:    All available imaging studies/reports in connect care for this admission were reviewed  High complexity decision making was performed during the evaluation of this patient at high risk for decompensation with multiple organ involvement         Disclaimer: Sections of this note are dictated utilizing voice recognition software, which may have resulted in some phonetic based errors in grammar and contents. Even though attempts were made to correct all the mistakes, some may have been missed, and remained in the body of the document. If questions arise, please contact our department.    Dr. Raiford SimmondsManali Jourden [place], Infectious Disease Specialist  [phone]  May 06, 2020  8:54 AM

## 2020-05-06 NOTE — Progress Notes (Signed)
Pharmacist Consult: Warfarin Management    Assessment/Plan  INR is subtherapeutic: 1.7  Will give warfarin 5 mg as a one-time dose.   Recheck INR daily and adjust dose accordingly. Will continue to follow INR trend and patient's clinical progress daily.     Subjective/Objective  Patient on warfarin therapy for Atrial Fibrillation.   Target goal INR of 2 - 3  Patient is has been on warfarin PTA. Patient's home dose is 5 mg MWF, 3.75 mg TTSS.   Patient was here last week for cardiac cath. She was sent home with warfarin + lovenox bridge. Patient INR low at 1.3, compliance vs. Need for increased dose. She has been same dose since ~10/21.     Date 12/19 12/20 12/21  12/22      INR 1.3 1.3 1.6 1.7      Warfarin Dose (mg) 5 mg 5 mg 3.75 mg 5mg           Bleeding/Sensitivity Risk Factors: Hx variable INRs, Recent Trauma or Surgery, and Hypertension  Potentially interacting medications include:   Drugs that may increase INR: None  Drugs that may decrease INR: None  Other anticoagulants include: Enoxaparin    Thank you,  1/23, [facility]  05/06/2020

## 2020-05-07 LAB — CBC WITH AUTOMATED DIFF
ABS. BASOPHILS: 0 10*3/uL (ref 0.0–0.1)
ABS. BASOPHILS: 0 10*3/uL (ref 0.0–0.1)
ABS. EOSINOPHILS: 0 10*3/uL (ref 0.0–0.4)
ABS. EOSINOPHILS: 0 10*3/uL (ref 0.0–0.4)
ABS. IMM. GRANS.: 0 10*3/uL (ref 0.00–0.04)
ABS. IMM. GRANS.: 0 10*3/uL (ref 0.00–0.04)
ABS. LYMPHOCYTES: 0.9 10*3/uL (ref 0.9–3.6)
ABS. LYMPHOCYTES: 1.2 10*3/uL (ref 0.9–3.6)
ABS. MONOCYTES: 0.3 10*3/uL (ref 0.05–1.2)
ABS. MONOCYTES: 0.2 10*3/uL (ref 0.05–1.2)
ABS. NEUTROPHILS: 1.3 10*3/uL — ABNORMAL LOW (ref 1.8–8.0)
ABS. NEUTROPHILS: 2.2 10*3/uL (ref 1.8–8.0)
ABSOLUTE NRBC: 0 10*3/uL (ref 0.00–0.01)
ABSOLUTE NRBC: 0 10*3/uL (ref 0.00–0.01)
BASOPHILS: 0 % (ref 0–2)
BASOPHILS: 0 % (ref 0–2)
EOSINOPHILS: 0 % (ref 0–5)
EOSINOPHILS: 0 % (ref 0–5)
HCT: 45 % (ref 35.0–45.0)
HCT: 42.3 % (ref 35.0–45.0)
HGB: 14.4 g/dL (ref 12.0–16.0)
HGB: 13.7 g/dL (ref 12.0–16.0)
IMMATURE GRANULOCYTES: 0 % (ref 0.0–0.5)
IMMATURE GRANULOCYTES: 0 % (ref 0.0–0.5)
LYMPHOCYTES: 37 % (ref 21–52)
LYMPHOCYTES: 34 % (ref 21–52)
MCH: 28.9 PG (ref 24.0–34.0)
MCH: 29.5 PG (ref 24.0–34.0)
MCHC: 32 g/dL (ref 31.0–37.0)
MCHC: 32.4 g/dL (ref 31.0–37.0)
MCV: 90.4 FL (ref 78.0–100.0)
MCV: 91 FL (ref 78.0–100.0)
MONOCYTES: 10 % (ref 3–10)
MONOCYTES: 6 % (ref 3–10)
MPV: 10.5 FL (ref 9.2–11.8)
MPV: 10.5 FL (ref 9.2–11.8)
NEUTROPHILS: 52 % (ref 40–73)
NEUTROPHILS: 60 % (ref 40–73)
NRBC: 0 PER 100 WBC
NRBC: 0 PER 100 WBC
PLATELET: 152 10*3/uL (ref 135–420)
PLATELET: 137 10*3/uL (ref 135–420)
RBC: 4.98 M/uL (ref 4.20–5.30)
RBC: 4.65 M/uL (ref 4.20–5.30)
RDW: 13.3 % (ref 11.6–14.5)
RDW: 13.3 % (ref 11.6–14.5)
WBC: 2.5 10*3/uL — ABNORMAL LOW (ref 4.6–13.2)
WBC: 3.6 10*3/uL — ABNORMAL LOW (ref 4.6–13.2)

## 2020-05-07 LAB — METABOLIC PANEL, BASIC
Anion gap: 9 mmol/L (ref 3.0–18)
Anion gap: 4 mmol/L (ref 3.0–18)
BUN/Creatinine ratio: 23 — ABNORMAL HIGH (ref 12–20)
BUN/Creatinine ratio: 25 — ABNORMAL HIGH (ref 12–20)
BUN: 38 MG/DL — ABNORMAL HIGH (ref 7.0–18)
BUN: 33 MG/DL — ABNORMAL HIGH (ref 7.0–18)
CO2: 24 mmol/L (ref 21–32)
CO2: 28 mmol/L (ref 21–32)
Calcium: 8.2 MG/DL — ABNORMAL LOW (ref 8.5–10.1)
Calcium: 8.1 MG/DL — ABNORMAL LOW (ref 8.5–10.1)
Chloride: 103 mmol/L (ref 100–111)
Chloride: 108 mmol/L (ref 100–111)
Creatinine: 1.64 MG/DL — ABNORMAL HIGH (ref 0.6–1.3)
Creatinine: 1.31 MG/DL — ABNORMAL HIGH (ref 0.6–1.3)
GFR est AA: 40 mL/min/{1.73_m2} — ABNORMAL LOW (ref >60)
GFR est AA: 51 mL/min/{1.73_m2} — ABNORMAL LOW (ref >60)
GFR est non-AA: 33 mL/min/{1.73_m2} — ABNORMAL LOW (ref >60)
GFR est non-AA: 42 mL/min/{1.73_m2} — ABNORMAL LOW (ref >60)
Glucose: 99 mg/dL (ref 74–99)
Glucose: 92 mg/dL (ref 74–99)
Potassium: 3.8 mmol/L (ref 3.5–5.5)
Potassium: 3.6 mmol/L (ref 3.5–5.5)
Sodium: 136 mmol/L (ref 136–145)
Sodium: 140 mmol/L (ref 136–145)

## 2020-05-07 LAB — C REACTIVE PROTEIN, QT: C-Reactive protein: 0.5 mg/dL — ABNORMAL HIGH (ref 0–0.3)

## 2020-05-07 LAB — PROCALCITONIN
Procalcitonin: 0.08 ng/mL
Procalcitonin: 0.08 ng/mL

## 2020-05-07 LAB — PROTHROMBIN TIME + INR
INR: 2.1 — ABNORMAL HIGH (ref 0.8–1.2)
Prothrombin time: 23 s — ABNORMAL HIGH (ref 11.5–15.2)

## 2020-05-07 LAB — D DIMER
D DIMER: 0.27 ug/ml(FEU) (ref <0.46)
D DIMER: 0.31 ug/ml(FEU) (ref <0.46)

## 2020-05-07 LAB — TROPONIN-HIGH SENSITIVITY: Troponin-High Sensitivity: 17 ng/L (ref 0–54)

## 2020-05-07 LAB — FERRITIN
Ferritin: 399 NG/ML — ABNORMAL HIGH (ref 8–388)
Ferritin: 399 NG/ML — ABNORMAL HIGH (ref 8–388)

## 2020-05-07 LAB — LACTIC ACID
Lactic Acid: 1.1 MMOL/L (ref 0.4–2.0)
Lactic acid: 1.1 MMOL/L (ref 0.4–2.0)

## 2020-05-07 LAB — CBC WITH AUTO DIFFERENTIAL
Basophils %: 0 % (ref 0–2)
Basophils %: 0 % (ref 0–2)
Basophils Absolute: 0 10*3/uL (ref 0.0–0.1)
Basophils Absolute: 0 10*3/uL (ref 0.0–0.1)
Eosinophils %: 0 % (ref 0–5)
Eosinophils %: 0 % (ref 0–5)
Eosinophils Absolute: 0 10*3/uL (ref 0.0–0.4)
Eosinophils Absolute: 0 10*3/uL (ref 0.0–0.4)
Granulocyte Absolute Count: 0 10*3/uL (ref 0.00–0.04)
Granulocyte Absolute Count: 0 10*3/uL (ref 0.00–0.04)
Hematocrit: 45 % (ref 35.0–45.0)
Hematocrit: 42.3 % (ref 35.0–45.0)
Hemoglobin: 14.4 g/dL (ref 12.0–16.0)
Hemoglobin: 13.7 g/dL (ref 12.0–16.0)
Immature Granulocytes: 0 % (ref 0.0–0.5)
Immature Granulocytes: 0 % (ref 0.0–0.5)
Lymphocytes %: 37 % (ref 21–52)
Lymphocytes %: 34 % (ref 21–52)
Lymphocytes Absolute: 0.9 10*3/uL (ref 0.9–3.6)
Lymphocytes Absolute: 1.2 10*3/uL (ref 0.9–3.6)
MCH: 28.9 PG (ref 24.0–34.0)
MCH: 29.5 PG (ref 24.0–34.0)
MCHC: 32 g/dL (ref 31.0–37.0)
MCHC: 32.4 g/dL (ref 31.0–37.0)
MCV: 90.4 FL (ref 78.0–100.0)
MCV: 91 FL (ref 78.0–100.0)
MPV: 10.5 FL (ref 9.2–11.8)
MPV: 10.5 FL (ref 9.2–11.8)
Monocytes %: 10 % (ref 3–10)
Monocytes %: 6 % (ref 3–10)
Monocytes Absolute: 0.3 10*3/uL (ref 0.05–1.2)
Monocytes Absolute: 0.2 10*3/uL (ref 0.05–1.2)
NRBC Absolute: 0 10*3/uL (ref 0.00–0.01)
NRBC Absolute: 0 10*3/uL (ref 0.00–0.01)
Neutrophils %: 52 % (ref 40–73)
Neutrophils %: 60 % (ref 40–73)
Neutrophils Absolute: 1.3 10*3/uL — ABNORMAL LOW (ref 1.8–8.0)
Neutrophils Absolute: 2.2 10*3/uL (ref 1.8–8.0)
Nucleated RBCs: 0 PER 100 WBC
Nucleated RBCs: 0 PER 100 WBC
Platelets: 152 10*3/uL (ref 135–420)
Platelets: 137 10*3/uL (ref 135–420)
RBC: 4.98 M/uL (ref 4.20–5.30)
RBC: 4.65 M/uL (ref 4.20–5.30)
RDW: 13.3 % (ref 11.6–14.5)
RDW: 13.3 % (ref 11.6–14.5)
WBC: 2.5 10*3/uL — ABNORMAL LOW (ref 4.6–13.2)
WBC: 3.6 10*3/uL — ABNORMAL LOW (ref 4.6–13.2)

## 2020-05-07 LAB — BASIC METABOLIC PANEL
Anion Gap: 9 mmol/L (ref 3.0–18)
Anion Gap: 4 mmol/L (ref 3.0–18)
BUN: 38 MG/DL — ABNORMAL HIGH (ref 7.0–18)
BUN: 33 MG/DL — ABNORMAL HIGH (ref 7.0–18)
Bun/Cre Ratio: 23 — ABNORMAL HIGH (ref 12–20)
Bun/Cre Ratio: 25 — ABNORMAL HIGH (ref 12–20)
CO2: 24 mmol/L (ref 21–32)
CO2: 28 mmol/L (ref 21–32)
Calcium: 8.2 MG/DL — ABNORMAL LOW (ref 8.5–10.1)
Calcium: 8.1 MG/DL — ABNORMAL LOW (ref 8.5–10.1)
Chloride: 103 mmol/L (ref 100–111)
Chloride: 108 mmol/L (ref 100–111)
Creatinine: 1.64 MG/DL — ABNORMAL HIGH (ref 0.6–1.3)
Creatinine: 1.31 MG/DL — ABNORMAL HIGH (ref 0.6–1.3)
EGFR IF NonAfrican American: 33 mL/min/{1.73_m2} — ABNORMAL LOW (ref >60)
EGFR IF NonAfrican American: 42 mL/min/{1.73_m2} — ABNORMAL LOW (ref >60)
GFR African American: 40 mL/min/{1.73_m2} — ABNORMAL LOW (ref >60)
GFR African American: 51 mL/min/{1.73_m2} — ABNORMAL LOW (ref >60)
Glucose: 99 mg/dL (ref 74–99)
Glucose: 92 mg/dL (ref 74–99)
Potassium: 3.8 mmol/L (ref 3.5–5.5)
Potassium: 3.6 mmol/L (ref 3.5–5.5)
Sodium: 136 mmol/L (ref 136–145)
Sodium: 140 mmol/L (ref 136–145)

## 2020-05-07 LAB — C-REACTIVE PROTEIN: CRP: 0.5 mg/dL — ABNORMAL HIGH (ref 0–0.3)

## 2020-05-07 LAB — TROPONIN, HIGH SENSITIVITY: Troponin, High Sensitivity: 17 ng/L (ref 0–54)

## 2020-05-07 LAB — D-DIMER, QUANTITATIVE
D-Dimer, Quant: 0.27 ug/ml(FEU) (ref <0.46)
D-Dimer, Quant: 0.31 ug/ml(FEU) (ref <0.46)

## 2020-05-07 LAB — PROTIME-INR
INR: 2.1 — ABNORMAL HIGH (ref 0.8–1.2)
Protime: 23 s — ABNORMAL HIGH (ref 11.5–15.2)

## 2020-05-07 MED ORDER — DOXYCYCLINE 100 MG CAP
100 mg | ORAL_CAPSULE | Freq: Two times a day (BID) | ORAL | 0 refills | Status: DC
Start: 2020-05-07 — End: 2020-05-08

## 2020-05-07 MED ORDER — DEXAMETHASONE 6 MG TAB
6 mg | ORAL_TABLET | ORAL | 0 refills | Status: DC
Start: 2020-05-07 — End: 2020-05-08

## 2020-05-07 MED ORDER — LACTATED RINGERS BOLUS IV
Freq: Once | INTRAVENOUS | Status: AC
Start: 2020-05-07 — End: 2020-05-07
  Administered 2020-05-07: 06:00:00 via INTRAVENOUS

## 2020-05-07 MED ORDER — WARFARIN 7.5 MG TAB
7.5 mg | Freq: Every evening | ORAL | Status: AC
Start: 2020-05-07 — End: 2020-05-07
  Administered 2020-05-07: 23:00:00 via ORAL

## 2020-05-07 MED ORDER — IBUPROFEN 600 MG TAB
600 mg | Freq: Four times a day (QID) | ORAL | Status: DC | PRN
Start: 2020-05-07 — End: 2020-05-08
  Administered 2020-05-08: 08:00:00 via ORAL

## 2020-05-07 MED ORDER — SODIUM CHLORIDE 0.9 % IV
INTRAVENOUS | Status: DC
Start: 2020-05-07 — End: 2020-05-08
  Administered 2020-05-07 – 2020-05-08 (×3): via INTRAVENOUS

## 2020-05-07 MED ORDER — CHOLECALCIFEROL (VITAMIN D3) 1,000 UNIT (25 MCG) TAB
ORAL_TABLET | Freq: Every day | ORAL | 3 refills | Status: DC
Start: 2020-05-07 — End: 2020-05-15

## 2020-05-07 MED ORDER — SODIUM CHLORIDE 0.9 % IV
Freq: Once | INTRAVENOUS | Status: AC
Start: 2020-05-07 — End: 2020-05-07
  Administered 2020-05-07: 18:00:00 via INTRAVENOUS

## 2020-05-07 MED ORDER — DEXTROMETHORPHAN-GUAIFENESIN 10 MG-100 MG/5 ML SYRUP
100-10 mg/5 mL | Freq: Four times a day (QID) | ORAL | 0 refills | Status: DC
Start: 2020-05-07 — End: 2020-05-15

## 2020-05-07 MED FILL — COUMADIN 7.5 MG TABLET: 7.5 mg | ORAL | Qty: 1

## 2020-05-07 MED FILL — CHOLECALCIFEROL (VITAMIN D3) 1,000 UNIT (25 MCG) TAB: ORAL | Qty: 1

## 2020-05-07 MED FILL — DEXTROMETHORPHAN-GUAIFENESIN 10 MG-100 MG/5 ML SYRUP: 100-10 mg/5 mL | ORAL | Qty: 10

## 2020-05-07 MED FILL — ACETAMINOPHEN 500 MG TAB: 500 mg | ORAL | Qty: 2

## 2020-05-07 MED FILL — DOXYCYCLINE HYCLATE 100 MG IV SOLUTION: 100 mg | INTRAVENOUS | Qty: 100

## 2020-05-07 MED FILL — PREGABALIN 50 MG CAP: 50 mg | ORAL | Qty: 1

## 2020-05-07 MED FILL — PANTOPRAZOLE 40 MG TAB, DELAYED RELEASE: 40 mg | ORAL | Qty: 1

## 2020-05-07 MED FILL — SODIUM CHLORIDE 0.9 % IV: INTRAVENOUS | Qty: 1000

## 2020-05-07 MED FILL — IBUPROFEN 600 MG TAB: 600 mg | ORAL | Qty: 1

## 2020-05-07 MED FILL — DEXAMETHASONE SODIUM PHOSPHATE 4 MG/ML IJ SOLN: 4 mg/mL | INTRAMUSCULAR | Qty: 2

## 2020-05-07 MED FILL — LAMOTRIGINE 25 MG TAB: 25 mg | ORAL | Qty: 2

## 2020-05-07 MED FILL — ENOXAPARIN 100 MG/ML SUB-Q SYRINGE: 100 mg/mL | SUBCUTANEOUS | Qty: 1

## 2020-05-07 MED FILL — HYDROXYZINE 10 MG TAB: 10 mg | ORAL | Qty: 3

## 2020-05-07 MED FILL — CETIRIZINE 10 MG TAB: 10 mg | ORAL | Qty: 1

## 2020-05-07 MED FILL — SODIUM CHLORIDE 0.9 % IV: INTRAVENOUS | Qty: 500

## 2020-05-07 MED FILL — LACTATED RINGERS IV: INTRAVENOUS | Qty: 1000

## 2020-05-07 MED FILL — PRAVASTATIN 20 MG TAB: 20 mg | ORAL | Qty: 2

## 2020-05-07 NOTE — Progress Notes (Signed)
Progress Notes by Benedetto Goad, DO at 05/07/20 8242                Author: Benedetto Goad, DO  Service: FAMILY MEDICINE  Author Type: Resident       Filed: 05/07/20 0711  Date of Service: 05/07/20 0704  Status: Attested           Editor: Benedetto Goad, DO (Resident)  Cosigner: Caryl Ada, MD at 05/07/20 1305          Attestation signed by Caryl Ada, MD at 05/07/20 1305                 I have seen and examined the patient. I reviewed the resident's note and agree with below.     Feeling better. More energetic. Up walking in room.      Caryl Ada, MD   05/07/2020, 1:03 PM                                       Intern Progress Note   [facility]              Patient: Mary [name]  MRN: [id number]   CSN: [id number]          Date of Birth: 1965/12/03   Age: 54 y.o.   Sex: female          DOA: 05/03/2020  LOS:  LOS: 4 days                          Subjective:            No events overnight.        Patient seen and examined this morning.  States she is feeling a little improved when compared to yesterday.  She thinks the Motrin is helping.  She admits to decreased appetite, decreased PO intake.  Continues to urinate regularly.  She is coughing less.   Breathing feels improved.  Wishes to be home by Christmas.        Review of Systems    Constitutional: Negative for chills, fever and malaise/fatigue.    Respiratory: Positive for cough. Negative for hemoptysis, sputum production, shortness of breath and wheezing.     Cardiovascular: Negative for chest pain, palpitations, orthopnea and leg swelling.    Gastrointestinal: Negative for abdominal pain, constipation, diarrhea, heartburn, nausea and vomiting.    Musculoskeletal: Negative for myalgias.    Skin: Negative for rash.    Neurological: Negative for headaches.            Objective:        Patient Vitals for the past 12 hrs:            Temp  Pulse  Resp  BP  SpO2            05/07/20 0436  98.7 ??F (37.1  ??C)  60  19  (!) 97/58  94 %            05/07/20 0044  --  --  --  98/64  --     05/06/20 2358  97.7 ??F (36.5 ??C)  61  20  (!) 73/49  93 %            05/06/20 2116  97.6 ??F (36.4 ??C)  63  20  95/62  90 %  Intake/Output Summary (Last 24 hours) at 05/07/2020 0704   Last data filed at 05/07/2020 1601     Gross per 24 hour        Intake  1220 ml        Output  0 ml        Net  1220 ml           Physical Exam:    General: Lying in bed, NAD, AOx3, NC in place    CV:  RRR, no M/G/R   RESP: Unlabored breathing. No wheezing, rhonchi and rales. Equal expansion bilaterally.    ABD:  Soft, nontender, nondistended. No hepatosplenomegaly.    MS:  No joint deformity or instability. No atrophy.   Ext:  No edema.  2+ radial and dp pulses bilaterally.   Skin: Warm & dry. No rashes, lesions, or ulcers.  Good turgor.    Psych: normal mood and affect       Lab/Data Reviewed:     Recent Results (from the past 24 hour(s))     METABOLIC PANEL, BASIC          Collection Time: 05/07/20  4:36 AM         Result  Value  Ref Range            Sodium  136  136 - 145 mmol/L       Potassium  3.8  3.5 - 5.5 mmol/L       Chloride  103  100 - 111 mmol/L       CO2  24  21 - 32 mmol/L       Anion gap  9  3.0 - 18 mmol/L       Glucose  99  74 - 99 mg/dL       BUN  38 (H)  7.0 - 18 MG/DL       Creatinine  0.93 (H)  0.6 - 1.3 MG/DL       BUN/Creatinine ratio  23 (H)  12 - 20         GFR est AA  40 (L)  >60 ml/min/1.53m2       GFR est non-AA  33 (L)  >60 ml/min/1.53m2       Calcium  8.2 (L)  8.5 - 10.1 MG/DL       CBC WITH AUTOMATED DIFF          Collection Time: 05/07/20  4:36 AM         Result  Value  Ref Range            WBC  2.5 (L)  4.6 - 13.2 K/uL            RBC  4.98  4.20 - 5.30 M/uL            HGB  14.4  12.0 - 16.0 g/dL       HCT  23.5  57.3 - 45.0 %       MCV  90.4  78.0 - 100.0 FL       MCH  28.9  24.0 - 34.0 PG       MCHC  32.0  31.0 - 37.0 g/dL       RDW  22.0  25.4 - 14.5 %       PLATELET  152  135 - 420 K/uL       MPV  10.5  9.2  - 11.8 FL       NRBC  0.0  0 PER 100 WBC       ABSOLUTE NRBC  0.00  0.00 - 0.01 K/uL       NEUTROPHILS  52  40 - 73 %       LYMPHOCYTES  37  21 - 52 %       MONOCYTES  10  3 - 10 %       EOSINOPHILS  0  0 - 5 %       BASOPHILS  0  0 - 2 %       IMMATURE GRANULOCYTES  0  0.0 - 0.5 %       ABS. NEUTROPHILS  1.3 (L)  1.8 - 8.0 K/UL       ABS. LYMPHOCYTES  0.9  0.9 - 3.6 K/UL       ABS. MONOCYTES  0.3  0.05 - 1.2 K/UL       ABS. EOSINOPHILS  0.0  0.0 - 0.4 K/UL       ABS. BASOPHILS  0.0  0.0 - 0.1 K/UL       ABS. IMM. GRANS.  0.0  0.00 - 0.04 K/UL       DF  AUTOMATED          PROCALCITONIN          Collection Time: 05/07/20  4:36 AM         Result  Value  Ref Range            Procalcitonin  0.08  ng/mL       C REACTIVE PROTEIN, QT          Collection Time: 05/07/20  4:36 AM         Result  Value  Ref Range            C-Reactive protein  0.5 (H)  0 - 0.3 mg/dL       D DIMER          Collection Time: 05/07/20  4:36 AM         Result  Value  Ref Range            D DIMER  0.27  <0.46 ug/ml(FEU)       FERRITIN          Collection Time: 05/07/20  4:36 AM         Result  Value  Ref Range            Ferritin  399 (H)  8 - 388 NG/ML       PROTHROMBIN TIME + INR          Collection Time: 05/07/20  4:36 AM         Result  Value  Ref Range            Prothrombin time  23.0 (H)  11.5 - 15.2 sec            INR  2.1 (H)  0.8 - 1.2               Assessment & Plan:        This is a 54 y.o. 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 Fevers improved over past day.  CXR stable.  Breathing and cough is improving.  NC turned down to 2L  this AM.  Plan to  wean O2 throughout today and walk test for oxygen requirement.     ??   1. Acute Hypoxic Respiratory failure 2/2 COVID-19 PNA:    Improving,  fevers improved, requiring less oxygen this morning.  Cough improving.  Doxycycling started yesterday for thought of an atypical pneumonia.  Her CXR was stable yesterday from previous.  Will wean O2 and walk test today.  Labs continue to be  stable.      - Supplemental oxygen as needed, wean as tolerated    - PT/OT/CM    - Infectious disease following, appreciate recommendations     - Continue Doxycycline x 7 days    - Trend CBC, BMP, CRP, D-dimer, Ferritin, Procal    - Continue dexamethasone IV 6 mg daily    - Contact precautions     - Continue home inhalers at this time     - 6MWT today   ??   2. pAfib currently NSR    - Continue Lovenox 90mcg BID until discharger per ID     - INR 2.1 at goal    - Pharmacy recommendations reviewed, appreciate assistance     - Continue Metoprolol 25 mg daily    ??   3. Bioprosthetic mitral valve replacement/Maze procedure/LAA appendage ligation in 2017/stable angina pectoris(s/p LHC):   LHC significant for Prox RCA lesion 30% stenosed.  Otherwise unremarkable.  BNP 1579, negative high-sensitivity troponin x 2    - Continue Lasix 40 mg daily    - TTE results reviewed     - Follow Troponin q6hrs 3 times   ??   4. Tobacco dependence/questionable COPD:   Patient with 40-pack-year smoking history.  Current every day smoker.    - Continue home inhalers    - No nebulizer treatments in the setting of COVID-19 infection    - Nicotine patch if needed   ??   5. Bipolar 1 disorder/PTSD:   Written for Lamotrigine 75 mg daily by Psychiatrist but patient states taking 50mg  at 75mg  is too high, causes sedation and confusion    - Continue Lamotrigine 50mg  daily      6. AKI   Cr doubled this morning, likely pre-renal secondary to limited PO intake as patient has not been feeling well.  She received fluid bolus overnight    - s/p 500ml LR bolus overnight    - Encourage PO intake    - start mIVF today   ??      Diet   Cardiac diet     DVT Prophylaxis   Lovenox 90 mg twice daily     GI Prophylaxis    Protonix 40 mg daily     Code status   Full     Disposition   Home     ??      Point of Contact  Jerrica Bulluck    Relationship: God-Daughter   [phone]             Benedetto GoadBrady S Jenean Escandon, DO , PGY-1     [facility]     May 07, 2020, 7:04 AM

## 2020-05-07 NOTE — Progress Notes (Signed)
Pharmacist Consult: Warfarin Management    Assessment/Plan  INR is therapeutic: 2.1  Will give warfarin 3.75 mg as a one-time dose.   Recheck INR daily and adjust dose accordingly. Will continue to follow INR trend and patient's clinical progress daily.     Subjective/Objective  Patient on warfarin therapy for Atrial Fibrillation.   Target goal INR of 2 - 3  Patient is has been on warfarin PTA. Patient's home dose is 5 mg MWF, 3.75 mg TTSS.   Patient was here last week for cardiac cath. She was sent home with warfarin + lovenox bridge. Patient INR low at 1.3, compliance vs. Need for increased dose. She has been same dose since ~10/21.     Date 12/19 12/20 12/21  12/22 12/23     INR 1.3 1.3 1.6 1.7 2.1     Warfarin Dose (mg) 5 mg 5 mg 3.75 mg 5mg  3.75         Bleeding/Sensitivity Risk Factors: Hx variable INRs, Recent Trauma or Surgery, and Hypertension  Potentially interacting medications include:   Drugs that may increase INR: None  Drugs that may decrease INR: None  Other anticoagulants include: Enoxaparin    Thank you,  1/24, [facility]  05/07/2020

## 2020-05-07 NOTE — Progress Notes (Signed)
Progress Notes by Benedetto Goad, DO at 05/07/20 1248                Author: Benedetto Goad, DO  Service: FAMILY MEDICINE  Author Type: Resident       Filed: 05/07/20 1250  Date of Service: 05/07/20 1248  Status: Signed           Editor: Benedetto Goad, DO (Resident)  Cosigner: Caryl Ada, MD at 05/08/20 [phone]                       Brief Progress Note   [facility]      Spoke with patient.  Planning for discharge later this afternoon or evening.      - Repeat BMP at 3pm, discharge if WNL   - New home discharge medications sent to [facility], patient should pick up prior to discharge   - Isolation / Quarantine until 12/28, in discharge paperwork   - Please assist with ride home if necessary        Benedetto Goad, DO, PGY-1       [facility]       May 07, 2020, 12:48 PM

## 2020-05-07 NOTE — Progress Notes (Signed)
Spoke to [facility] liason [facility]ae[facility]Jin and they are not going to be adding patient to their list after today because of the limited schedule. She couldn't be added, despite referral yesterday because the patient still doesn't have a HH order. Pending order. Will need to find another [facility] company if discharged home over the weekend.    Roseanne Kaufman, RN Case Management

## 2020-05-07 NOTE — Progress Notes (Signed)
MEWS score 4 due to elevated temp. Tylenol was given earlier. Md;s aware.

## 2020-05-07 NOTE — Progress Notes (Signed)
Progress Notes by Benedetto Goad, DO at 05/07/20 1333                Author: Benedetto Goad, DO  Service: FAMILY MEDICINE  Author Type: Resident       Filed: 05/07/20 1344  Date of Service: 05/07/20 1333  Status: Signed           Editor: Benedetto Goad, DO (Resident)  Cosigner: Caryl Ada, MD at 05/08/20 [phone]                       Brief Progress Note   [facility]           Subjective:         Called to patient bedside by RN as found in restroom with low HR, appeared pale, and hypotensive.  Patient was trying to have a BM.  No Loc, did not hit head. She was assisted back to bed, placed on [facility] and vital signs repeated.  Patient states this  has never happened before, no history of syncope or passing out while urinating or having BM.  She states she was previously feeling better and ready to go home but now she feels tired and cold.  She does not feel SOB, no headache, dizziness, chest pain.   States her breathing is normal.  She has been trying to eat better today, states ate a normal breakfast.  She has been urinating normally today.          Objective:        Visit Vitals      BP  92/65 (BP 1 Location: Left upper arm, BP Patient Position: At rest)     Pulse  70     Temp  98.1 ??F (36.7 ??C)     Resp  18     Ht  5\' 3"  (1.6 m)     Wt  88 kg (194 lb)     SpO2  95%     Breastfeeding  No        BMI  34.37 kg/m??           Physical Exam:    General: NAD, lying in bed under blankets, appears fatigued   HEENT: Conjunctiva pink, sclera anicteric. PERRL. EOMI    CV:  RRR, no M/G/R   RESP: Unlabored breathing. Mild expiratory wheezing. Equal expansion bilaterally.    ABD:  Soft, nontender, nondistended. No hepatosplenomegaly.    MS:  No joint deformity or instability. No atrophy.   Neuro:  5/5 strength bilateral upper extremities and lower extremities. A+Ox3.   Ext:  No edema.  2+ radial and dp pulses bilaterally.   Skin: Warm & dry. No rashes, lesions, or ulcers.  Good  turgor.    Psych: normal mood and affect            Assessment & Plan:        Patient with likely vasovagal event while having bowel movement this afternoon.  This has never occurred to patient in the past.  She is now stable, lying in bed, at her baseline.  Tele reviewed, noted  bradycardia during event.       - NS bolus then resume 154ml/hr mIVF   - EKG: NSR, rate 65, no signs of ischemia   - Repeat labs as ordered   - Supplemental oxygen as needed   - Fall precautions, bedside commode, call before out of bed   -  Continue tele   - Continue to hold BB and diuretics    - No discharge today        Benedetto Goad, DO, PGY-1     [facility] Medicine       May 07, 2020, 1:44 PM

## 2020-05-07 NOTE — Progress Notes (Signed)
Progress Notes by Lisbeth Ply, MD at 05/07/20 2008                Author: Lisbeth Ply, MD  Service: FAMILY MEDICINE  Author Type: Resident       Filed: 05/07/20 2047  Date of Service: 05/07/20 2008  Status: Signed           Editor: Lisbeth Ply, MD (Resident)  Cosigner: Caryl Ada, MD at 05/08/20 [phone]                       Brief PM Progress Note   [facility]           Subjective:         Visited patient at bedside for PM check. Patient states that her breathing has not worsened or improved throughout the day. She is aware of the change in her vitals, including her episodes of fever and hypotension but denies any associated symptoms currently.  She reports fatigue and muscle aches. She also endorses decreased appetite, but we discussed the importance of her PO intake especially considering her low BP and she expressed understanding.       ROS: Denies fever, HA, CP, muscle weakness.        Objective:        Visit Vitals      BP  97/60 (BP 1 Location: Right upper arm, BP Patient Position: At rest)     Pulse  83     Temp  98.9 ??F (37.2 ??C)     Resp  20     Ht  5\' 3"  (1.6 m)     Wt  88 kg (194 lb)     SpO2  94%     Breastfeeding  No        BMI  34.37 kg/m??           Physical Exam:    General: Appears fatigued. NAD.   CV:  RRR, no M/G/R.   RESP: Unlabored breathing. Lungs CTAB. Equal expansion bilaterally.    ABD:  Soft, nontender, nondistended.    MS:  No joint deformity or instability. No atrophy.   Ext: Moves extremities freely and purposefully. 1+ radial and dp pulses bilaterally. No edema, erythema, or tenderness.    Skin: Warm & dry. No rashes, lesions, or ulcers.     Psych: Appropriate mood and affect.           Assessment & Plan:     VSS. Will continue to monitor BP. Has ibuprofen 600mg  q6hrs prn for fever or pain. No change in management at this time.      For full assessment and plan, please see daily progress note.         , MD, PGY-1     [facility]  [facility] Medicine       May 07, 2020, 8:08 PM

## 2020-05-08 LAB — EKG, 12 LEAD, INITIAL
Atrial Rate: 65 {beats}/min
Calculated P Axis: 92 degrees
Calculated R Axis: 44 degrees
Calculated T Axis: 75 degrees
Diagnosis: NORMAL
P-R Interval: 156 ms
Q-T Interval: 398 ms
QRS Duration: 84 ms
QTC Calculation (Bezet): 413 ms
Ventricular Rate: 65 {beats}/min

## 2020-05-08 LAB — CBC WITH AUTOMATED DIFF
ABS. BASOPHILS: 0 10*3/uL (ref 0.0–0.1)
ABS. EOSINOPHILS: 0 10*3/uL (ref 0.0–0.4)
ABS. IMM. GRANS.: 0 10*3/uL (ref 0.00–0.04)
ABS. LYMPHOCYTES: 0.8 10*3/uL — ABNORMAL LOW (ref 0.9–3.6)
ABS. MONOCYTES: 0.2 10*3/uL (ref 0.05–1.2)
ABS. NEUTROPHILS: 1.9 10*3/uL (ref 1.8–8.0)
ABSOLUTE NRBC: 0 10*3/uL (ref 0.00–0.01)
BASOPHILS: 0 % (ref 0–2)
EOSINOPHILS: 0 % (ref 0–5)
HCT: 37.6 % (ref 35.0–45.0)
HGB: 12.1 g/dL (ref 12.0–16.0)
IMMATURE GRANULOCYTES: 0 % (ref 0.0–0.5)
LYMPHOCYTES: 28 % (ref 21–52)
MCH: 29.2 PG (ref 24.0–34.0)
MCHC: 32.2 g/dL (ref 31.0–37.0)
MCV: 90.6 FL (ref 78.0–100.0)
MONOCYTES: 6 % (ref 3–10)
MPV: 10.2 FL (ref 9.2–11.8)
NEUTROPHILS: 65 % (ref 40–73)
NRBC: 0 PER 100 WBC
PLATELET: 115 10*3/uL — ABNORMAL LOW (ref 135–420)
RBC: 4.15 M/uL — ABNORMAL LOW (ref 4.20–5.30)
RDW: 13.2 % (ref 11.6–14.5)
WBC: 3 10*3/uL — ABNORMAL LOW (ref 4.6–13.2)

## 2020-05-08 LAB — METABOLIC PANEL, BASIC
Anion gap: 8 mmol/L (ref 3.0–18)
BUN/Creatinine ratio: 23 — ABNORMAL HIGH (ref 12–20)
BUN: 19 MG/DL — ABNORMAL HIGH (ref 7.0–18)
CO2: 20 mmol/L — ABNORMAL LOW (ref 21–32)
Calcium: 8 MG/DL — ABNORMAL LOW (ref 8.5–10.1)
Chloride: 113 mmol/L — ABNORMAL HIGH (ref 100–111)
Creatinine: 0.81 MG/DL (ref 0.6–1.3)
GFR est AA: >60 mL/min/{1.73_m2} (ref >60)
GFR est non-AA: >60 mL/min/{1.73_m2} (ref >60)
Glucose: 103 mg/dL — ABNORMAL HIGH (ref 74–99)
Potassium: 3.7 mmol/L (ref 3.5–5.5)
Sodium: 141 mmol/L (ref 136–145)

## 2020-05-08 LAB — BLOOD GAS, ARTERIAL POC
BASE DEFICIT (POC): 2.4 mmol/L
Base deficit (POC): 2.4 mmol/L
HCO3 (POC): 19.9 MMOL/L — ABNORMAL LOW (ref 22–26)
HCO3, Art: 19.9 MMOL/L — ABNORMAL LOW (ref 22–26)
POC O2 SAT: 93.9 % (ref 92–97)
pCO2 (POC): 26.9 MMHG — ABNORMAL LOW (ref 35.0–45.0)
pCO2, Art: 26.9 MMHG — ABNORMAL LOW (ref 35.0–45.0)
pH (POC): 7.48 — ABNORMAL HIGH (ref 7.35–7.45)
pH, Art: 7.48 — ABNORMAL HIGH (ref 7.35–7.45)
pO2 (POC): 64 MMHG — ABNORMAL LOW (ref 80–100)
pO2, Art: 64 MMHG — ABNORMAL LOW (ref 80–100)
sO2 (POC): 93.9 % (ref 92–97)

## 2020-05-08 LAB — PROTHROMBIN TIME + INR
INR: 2 — ABNORMAL HIGH (ref 0.8–1.2)
Prothrombin time: 22.7 s — ABNORMAL HIGH (ref 11.5–15.2)

## 2020-05-08 LAB — FERRITIN
Ferritin: 806 NG/ML — ABNORMAL HIGH (ref 8–388)
Ferritin: 806 NG/ML — ABNORMAL HIGH (ref 8–388)

## 2020-05-08 LAB — D DIMER: D DIMER: 0.27 ug/ml(FEU) (ref <0.46)

## 2020-05-08 LAB — PROCALCITONIN
Procalcitonin: <0.05 ng/mL
Procalcitonin: <0.05 ng/mL

## 2020-05-08 LAB — C REACTIVE PROTEIN, QT: C-Reactive protein: 0.6 mg/dL — ABNORMAL HIGH (ref 0–0.3)

## 2020-05-08 LAB — CBC WITH AUTO DIFFERENTIAL
Basophils %: 0 % (ref 0–2)
Basophils Absolute: 0 10*3/uL (ref 0.0–0.1)
Eosinophils %: 0 % (ref 0–5)
Eosinophils Absolute: 0 10*3/uL (ref 0.0–0.4)
Granulocyte Absolute Count: 0 10*3/uL (ref 0.00–0.04)
Hematocrit: 37.6 % (ref 35.0–45.0)
Hemoglobin: 12.1 g/dL (ref 12.0–16.0)
Immature Granulocytes: 0 % (ref 0.0–0.5)
Lymphocytes %: 28 % (ref 21–52)
Lymphocytes Absolute: 0.8 10*3/uL — ABNORMAL LOW (ref 0.9–3.6)
MCH: 29.2 PG (ref 24.0–34.0)
MCHC: 32.2 g/dL (ref 31.0–37.0)
MCV: 90.6 FL (ref 78.0–100.0)
MPV: 10.2 FL (ref 9.2–11.8)
Monocytes %: 6 % (ref 3–10)
Monocytes Absolute: 0.2 10*3/uL (ref 0.05–1.2)
NRBC Absolute: 0 10*3/uL (ref 0.00–0.01)
Neutrophils %: 65 % (ref 40–73)
Neutrophils Absolute: 1.9 10*3/uL (ref 1.8–8.0)
Nucleated RBCs: 0 PER 100 WBC
Platelets: 115 10*3/uL — ABNORMAL LOW (ref 135–420)
RBC: 4.15 M/uL — ABNORMAL LOW (ref 4.20–5.30)
RDW: 13.2 % (ref 11.6–14.5)
WBC: 3 10*3/uL — ABNORMAL LOW (ref 4.6–13.2)

## 2020-05-08 LAB — C-REACTIVE PROTEIN: CRP: 0.6 mg/dL — ABNORMAL HIGH (ref 0–0.3)

## 2020-05-08 LAB — PROTIME-INR
INR: 2 — ABNORMAL HIGH (ref 0.8–1.2)
Protime: 22.7 s — ABNORMAL HIGH (ref 11.5–15.2)

## 2020-05-08 LAB — EKG 12-LEAD
Atrial Rate: 65 {beats}/min
Diagnosis: NORMAL
P Axis: 92 degrees
P-R Interval: 156 ms
Q-T Interval: 398 ms
QRS Duration: 84 ms
QTc Calculation (Bazett): 413 ms
R Axis: 44 degrees
T Axis: 75 degrees
Ventricular Rate: 65 {beats}/min

## 2020-05-08 LAB — BASIC METABOLIC PANEL
Anion Gap: 8 mmol/L (ref 3.0–18)
BUN: 19 MG/DL — ABNORMAL HIGH (ref 7.0–18)
Bun/Cre Ratio: 23 — ABNORMAL HIGH (ref 12–20)
CO2: 20 mmol/L — ABNORMAL LOW (ref 21–32)
Calcium: 8 MG/DL — ABNORMAL LOW (ref 8.5–10.1)
Chloride: 113 mmol/L — ABNORMAL HIGH (ref 100–111)
Creatinine: 0.81 MG/DL (ref 0.6–1.3)
EGFR IF NonAfrican American: >60 mL/min/{1.73_m2} (ref >60)
GFR African American: >60 mL/min/{1.73_m2} (ref >60)
Glucose: 103 mg/dL — ABNORMAL HIGH (ref 74–99)
Potassium: 3.7 mmol/L (ref 3.5–5.5)
Sodium: 141 mmol/L (ref 136–145)

## 2020-05-08 LAB — D-DIMER, QUANTITATIVE: D-Dimer, Quant: 0.27 ug/ml(FEU) (ref <0.46)

## 2020-05-08 MED ORDER — WARFARIN 5 MG TAB
5 mg | Freq: Every evening | ORAL | Status: AC
Start: 2020-05-08 — End: 2020-05-08
  Administered 2020-05-08: 23:00:00 via ORAL

## 2020-05-08 MED ORDER — DEXAMETHASONE 6 MG TAB
6 mg | ORAL_TABLET | ORAL | 0 refills | Status: DC
Start: 2020-05-08 — End: 2020-05-15

## 2020-05-08 MED ORDER — DOXYCYCLINE 100 MG CAP
100 mg | ORAL_CAPSULE | Freq: Two times a day (BID) | ORAL | 0 refills | Status: AC
Start: 2020-05-08 — End: 2020-05-12

## 2020-05-08 MED ORDER — IBUPROFEN 200 MG TAB
200 mg | Freq: Four times a day (QID) | ORAL | Status: DC
Start: 2020-05-08 — End: 2020-05-15
  Administered 2020-05-08 – 2020-05-15 (×27): via ORAL

## 2020-05-08 MED FILL — PRAVASTATIN 20 MG TAB: 20 mg | ORAL | Qty: 2

## 2020-05-08 MED FILL — IBUPROFEN 600 MG TAB: 600 mg | ORAL | Qty: 1

## 2020-05-08 MED FILL — DEXTROMETHORPHAN-GUAIFENESIN 10 MG-100 MG/5 ML SYRUP: 100-10 mg/5 mL | ORAL | Qty: 10

## 2020-05-08 MED FILL — CETIRIZINE 10 MG TAB: 10 mg | ORAL | Qty: 1

## 2020-05-08 MED FILL — PREGABALIN 50 MG CAP: 50 mg | ORAL | Qty: 1

## 2020-05-08 MED FILL — ENOXAPARIN 100 MG/ML SUB-Q SYRINGE: 100 mg/mL | SUBCUTANEOUS | Qty: 1

## 2020-05-08 MED FILL — ACETAMINOPHEN 500 MG TAB: 500 mg | ORAL | Qty: 2

## 2020-05-08 MED FILL — DOXYCYCLINE HYCLATE 100 MG IV SOLUTION: 100 mg | INTRAVENOUS | Qty: 100

## 2020-05-08 MED FILL — WARFARIN 5 MG TAB: 5 mg | ORAL | Qty: 1

## 2020-05-08 MED FILL — DEXAMETHASONE SODIUM PHOSPHATE 4 MG/ML IJ SOLN: 4 mg/mL | INTRAMUSCULAR | Qty: 2

## 2020-05-08 MED FILL — LAMOTRIGINE 25 MG TAB: 25 mg | ORAL | Qty: 2

## 2020-05-08 MED FILL — PANTOPRAZOLE 40 MG TAB, DELAYED RELEASE: 40 mg | ORAL | Qty: 1

## 2020-05-08 MED FILL — CHOLECALCIFEROL (VITAMIN D3) 1,000 UNIT (25 MCG) TAB: ORAL | Qty: 1

## 2020-05-08 NOTE — Progress Notes (Signed)
Discharge instructions given, signature obtained. Pt educated on the importance of self-quarantine until 05/12/20 at the earliest. She verbalized understanding.

## 2020-05-08 NOTE — Progress Notes (Signed)
Bedside report given to Tammy, RN. Pt resting quietly in bed at this time, call bell within reach.

## 2020-05-08 NOTE — Progress Notes (Signed)
Per pt, she needs transport home.  She verified her address.    Requested Case Management specialist to assist with transportation to [address], [place] [address] and phone number is [phone]  Patient will require cab transport.   Pt requires stretcher - Covid 19, respiratory failure, Afib, isolation  Patient is currently requiring oxygen No No  Height:5'3   Weight: 194 Ib  Pt is on isolation: Yes Isolation is for: covid - droplet plus  Is the pt ready now? yes  Requested time: Next Available  PCS Faxed: no  Insurance verified on face sheet: yes  Auth needed for transport: yes  CM completed PCS/ Envelope and placed on chart.           Sheila Oats, BSN RN  Care Management  Pager: [phone]

## 2020-05-08 NOTE — Discharge Summary (Signed)
Discharge Summary by Angus Seller, DO at 05/08/20 1134                Author: Angus Seller, DO  Service: FAMILY MEDICINE  Author Type: Resident       Filed: 05/08/20 1139  Date of Service: 05/08/20 1134  Status: Attested           Editor: Angus Seller, DO (Resident)  Cosigner: Izora Ribas, MD at 05/08/20 1228          Attestation signed by Izora Ribas, MD at 05/08/20 1228                 I have seen and examined the patient. I reviewed the resident's note and agree with below.     Discussed smoking cessation      Izora Ribas, MD   05/08/2020, 12:28 PM                                          Discharge Summary   [facility]             Patient: Mary [name]  MRN: [id number]   CSN: [id number]          Date of Birth: [birth year]   Age: 54 y.o.   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   Bipolar 1 disorder   AKI      Important notes to PCP/ follow-up studies and evaluations    - Lasix held due to AKI while inpatient, restart at follow up when appropriate   - Metoprolol held during inpatient due to hypotension, restart at follow up   - No oxygen need upon discharge   - INR 2.0 upon discharge, used Lovenox s/p heart catheterization and while inpatient   - Total 10 days of Decadron (4 days after discharge)   - Total 7 days of Doxycycline (4 days after discharge)   - COVID vaccine 3 months after discharge      Pending labs and studies:   None      Operative Procedures:     None      Discharge Medications:        Current Discharge Medication List              START taking these medications          Details        dexAMETHasone (Decadron) 6 mg tablet  Take 1 tab by mouth daily until complete starting the day after discharge  Indications: COVID-19   Qty: 4 Tablet, Refills:  0   Start date: 05/08/2020               doxycycline (MONODOX) 100 mg capsule  Take 1 Capsule by mouth two (2) times a day for 4 days.   Qty:  10 Capsule, Refills:  0   Start date: 05/08/2020, End date:  05/12/2020               cholecalciferol (VITAMIN D3) (1000 Units /25 mcg) tablet  Take 2 Tablets by mouth daily.   Qty: 30 Tablet, Refills:  3   Start date: 05/08/2020               guaiFENesin-dextromethorphan (ROBITUSSIN DM) 100-10 mg/5 mL syrup  Take 5 mL by mouth every six (6) hours for 10 days.   Qty: 200 mL, Refills:  0   Start date: 05/07/2020, End date:  05/17/2020                     CONTINUE these medications which have NOT CHANGED          Details        hydrOXYzine HCL (ATARAX) 25 mg tablet  Take  by mouth nightly as needed.               cyclobenzaprine (FLEXERIL) 5 mg tablet  Take 5 mg by mouth nightly as needed for Muscle Spasm(s).               acetaminophen (Tylenol Extra Strength) 500 mg tablet  Take 1,000 mg by mouth every eight (8) hours as needed for Pain.               pregabalin (LYRICA) 200 mg capsule  take 1 capsule by mouth three times a day   Qty: 90 Capsule, Refills:  1          Associated Diagnoses: Neck pain; Chronic pain syndrome               albuterol (PROVENTIL HFA, VENTOLIN HFA, PROAIR HFA) 90 mcg/actuation inhaler  Take 2 Puffs by inhalation every six (6) hours as needed.               cetirizine (ZYRTEC) 10 mg tablet  Take 10 mg by mouth daily.               fluticasone propionate (FLONASE) 50 mcg/actuation nasal spray  1 Spray by Nasal route daily.               Combivent Respimat 20-100 mcg/actuation inhaler  INHALE 1 PUFF BY MOUTH INTO LUNGS EVERY 6 HOURS                lamoTRIgine (LaMICtal) 25 mg tablet  TAKE 3 TABLETS BY MOUTH ONCE DAILY               metoprolol succinate (TOPROL-XL) 25 mg XL tablet                 omeprazole (PRILOSEC) 40 mg capsule  Take 40 mg by mouth daily.               pravastatin (PRAVACHOL) 40 mg tablet  Take 40 mg by mouth nightly.                 warfarin (COUMADIN) 2.5 mg tablet  TAKE 2 TABLETS BY MOUTH ONCE DAILY ON MONDAY WEDNESDAY FRIDAY AND TAKE 1 & 1 2 (ONE AND ONE HALF) ALL OTHER DAYS                     STOP taking these medications                  enoxaparin (Lovenox) 40 mg/0.4 mL  Comments:    Reason for Stopping:                      furosemide (LASIX) 40 mg tablet  Comments:    Reason for Stopping:                             Disposition: Home      Consultants:     Infectious Disease       Brief Hospital Course (including pertinent history and physical findings)      Mary [name] is a 54 y.o. female with bioprosthetic mitral valve replacement/Maze procedure/LAA appendage ligation in 2017, Afib(on warfarin bridged with Lovenox), HTN, Smoker, Bipolar  1 disorder/PTSD, stable angina pectoris (s/p LHC) now admitted with acute hypoxic respiratory failure in the setting of COVID-19.   ??    Patient stated that her symptoms started yesterday and that she noted a fever of 101.82F day prior to admission.    ??   Of note patient is not vaccinated against COVID-19, and has never had COVID-19 infection in the past.      She was admitted to the hospitalist service due to her need for oxygen via NC.  She was started on Decadron 48m IV daily.  Infectious disease was consulted.  Her COVID-19 labs were trended during hospitalized and remained within normal limits.  She did  not have a CTA or PVL's to rule out PE or DVT as she had negative d-dimer throughout hospitalization.  On HD2, she was weaned from oxygen however required oxygen again that afternoon and stated she felt worse, having worsening cough and more fatigue.   She had multiple fevers that day.   Her procalcitonin and labs continued to be normal.  A repeat CXR showed no change from admission.  She was started on Doxycycline by ID due to concern for atypical pneumonia.  She was started on scheduled Tylenol and  Motrin which made patient more comfortable overnight.  She was watched an additional day while on 2-3L NC.  On 12/23, Her morning labs revealed an increase in Cr to 1.6 showing an AKI, patient admits to decreased PO intake the previous day due to fatigue.   She was started on mIVF after a 5038mLR bolus.  [facility] and continued to improve and feel well.  That afternoon, patient was found hypotensive and bradycardic in the restroom while having a bowel movement, she was transferred  to bed.  EKG was normal, non-ischemic.  Her repeat lab work was stable.  She received another 50051mS bolus and continued mIVF.  She was watched closely overnight.  On the day of discharge, her blood pressure was improved and normotensive.  She was feeling  improved, not requiring oxygen and breathing normally.  She wished to be discharged home.  [facility]        Upon admission, patient's INR was subtherapeutic after warfarin was held prior to her heart catheterization, she was taking Lovenox as a bridge.  Her Lovenox was continued BID at a therapeutic dose.  She was continued on her warfarin dose and INR was  checked daily until it was at goal 2-3. It was 2.1, on the day of discharge.  She was discharged on her normal warfarin dosing schedule and her Lovenox was discontinued.        She  was counseled extensively on quarantine and isolation in her home, recommending she remain isolated in her room and wear a mask when she is ambulating to restroom or kitchen.  She should remain isolated for 10 days after symptom onset.        She will receive a total of 7 days of Doxycycline and 10 days of Decadron.  She did not receive remdesivir  during this hospitalization.             CURRENT ADMISSION IMAGING RESULTS       XR CHEST PORT      Result Date: 05/06/2020   No significant interval change. Hypoventilatory changes.      XR CHEST PORT      Result Date: 05/04/2020   Increasing streaky densities at the bases likely increasing small pleural effusions.      XR CHEST PORT      Result Date: 05/03/2020   Stable enlarged cardiac silhouette. Question hazy basilar opacities. Question small pleural effusion. Findings may reflect developing edema and/or pneumonitis. Correlate clinically. Follow-up can be obtained.            Cardiology Procedures/Testing:      MODALITY  RESULTS        EKG  Results for orders placed or performed during the hospital encounter of 05/03/20      EKG, 12 LEAD, INITIAL      Result  Value  Ref Range        Ventricular Rate  65  BPM        Atrial Rate  65  BPM        P-R Interval  156  ms        QRS Duration  84  ms        Q-T Interval  398  ms        QTC Calculation (Bezet)  413  ms        Calculated P Axis  92  degrees        Calculated R Axis  44  degrees        Calculated T Axis  75  degrees        Diagnosis              Normal sinus rhythm   Nonspecific ST and T wave abnormality   Abnormal ECG   When compared with ECG of 03-May-2020 09:29,   ST no longer depressed in Inferior leads   T wave inversion less evident in Lateral leads                     ECHO  05/03/20      ECHO ADULT FOLLOW-UP OR LIMITED 05/04/2020 05/04/2020      Interpretation Summary   ?  COVID +   ?  Contrast used: Definity.   ?  Technical qualifiers: Echo study was technically difficult with poor endocardial visualization and limited due to patient's condition.   ?  Left??Ventricle: Left ventricle size is normal. Mildly increased wall thickness. See diagram for wall motion findings. EF by visual approximation is 50%.   ?  Right??Ventricle: Not well visualized.   ?  Aortic??Valve: Mild transvalvular regurgitation.   ?  Mitral??Valve: Bioprosthetic valve. Elevated  prosthetic gradient. MV mean gradient is 9 mmHg.   ?  Pulmonary??Arteries: Pulmonary hypertension not present. The estimated pulmonary artery systolic pressure is 27 mmHg.      Signed by: Leland Her, MD on 05/04/2020  9:23  PM           IR  No results found for this or any previous visit.           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   Elevated LVEDP.   Recommend intense risk factor modification.      Signed by: Fredirick Maudlin, MD on 04/27/2020 10:52 AM           Special Testing/Procedures:      MODALITY  RESULTS        MICRO  All Micro Results         Procedure  Component  Value  Units  Date/Time        CULTURE, RESPIRATORY/SPUTUM/BRONCH Sid Falcon STAIN [[id number]]          Order Status: Sent  Specimen: Sputum          RESPIRATORY VIRUS PANEL W/COVID-19, PCR [[id number]]  (Abnormal)  Collected: 05/03/20 1010        Order Status: Completed  Specimen: Nasopharyngeal  Updated: 05/03/20 1116          Adenovirus  Not detected               Coronavirus 229E  Not detected               Coronavirus HKU1  Not detected               Coronavirus CVNL63  Not detected               Coronavirus OC43  Not detected               SARS-CoV-2, PCR  Detected                 Comment:  NOTIFIED DAWN RN [place] ED 05/03/20 BY VMB                Metapneumovirus  Not detected               Rhinovirus and Enterovirus  Not detected               Influenza A  Not detected               Influenza B  Not detected               Parainfluenza 1  Not detected               Parainfluenza 2  Not detected               Parainfluenza 3  Not detected               Parainfluenza virus 4  Not detected               RSV by PCR  Not detected               B. parapertussis, PCR  Not detected               Bordetella pertussis - PCR  Not detected               Chlamydophila pneumoniae DNA, QL, PCR  Not detected               Mycoplasma pneumoniae  DNA, QL, PCR  Not detected  ABG  Lab Results      Component  Value  Date/Time        pH (POC)  7.48 (H)  05/08/2020 04:35 AM        pCO2 (POC)  26.9 (L)  05/08/2020 04:35 AM        pO2 (POC)  64 (L)  05/08/2020 04:35 AM        HCO3 (POC)  19.9 (L)  05/08/2020 04:35 AM        FIO2 (POC)  32  05/03/2020 09:26 PM                 UA  No results found for this or any previous visit.        Laboratory Results:      LABORATORY  RESULTS        HEMATOLOGY  Lab Results      Component  Value  Date/Time        WBC  3.0 (L)  05/08/2020 05:56 AM        HGB  12.1  05/08/2020 05:56 AM        HCT  37.6  05/08/2020 05:56 AM        PLATELET  115 (L)  05/08/2020 05:56 AM        MCV  90.6  05/08/2020 05:56 AM                  CHEMISTRIES  Lab Results      Component  Value  Date/Time        Sodium  141  05/08/2020 05:56 AM        Potassium  3.7  05/08/2020 05:56 AM        Chloride  113 (H)  05/08/2020 05:56 AM        CO2  20 (L)  05/08/2020 05:56 AM        Anion gap  8  05/08/2020 05:56 AM        Glucose  103 (H)  05/08/2020 05:56 AM        BUN  19 (H)  05/08/2020 05:56 AM        Creatinine  0.81  05/08/2020 05:56 AM        BUN/Creatinine ratio  23 (H)  05/08/2020 05:56 AM        GFR est AA  >60  05/08/2020 05:56 AM        GFR est non-AA  >60  05/08/2020 05:56 AM        Calcium  8.0 (L)  05/08/2020 05:56 AM                 HEPATIC FUNCTION  Lab Results      Component  Value  Date/Time        Albumin  4.2  05/03/2020 09:40 AM        Bilirubin, total  0.6  05/03/2020 09:40 AM        Protein, total  7.3  05/03/2020 09:40 AM        Globulin  3.1  05/03/2020 09:40 AM        A-G Ratio  1.4  05/03/2020 09:40 AM        ALT (SGPT)  32  05/03/2020 09:40 AM        Alk. phosphatase  55  05/03/2020 09:40 AM               LACTIC ACID  Lab Results      Component  Value  Date/Time        Lactic acid  1.1  05/07/2020 03:30 PM           CARDIAC PANEL  Lab Results      Component  Value  Date/Time        CK  211 (H)  04/05/2020  09:22 AM        CK - MB  1.9  04/05/2020 09:22 AM        CK-MB Index  0.9  04/05/2020 09:22 AM        Troponin-I, QT  <0.02  04/05/2020 12:38 PM              NT-proBNP  Lab Results      Component  Value  Date/Time        NT pro-BNP  1,579 (H)  05/03/2020 09:40 AM        NT pro-BNP  506  04/05/2020 09:22 AM        NT pro-BNP  441  12/26/2019 10:50 AM        NT pro-BNP  430  10/29/2019 11:32 AM                 THYROID  Lab Results      Component  Value  Date/Time        TSH  1.88  04/22/2020 08:52 AM                 Functional status and cognitive function:     Ambulates without assistance   Status: alert, cooperative, no distress, appears stated age   Condition: STABLE      Physical exam on day of discharge:     General: well-appearing, NAD   HEENT: Conjunctiva pink, sclera anicteric. PERRL. EOMI    CV:  RRR, no M/G/R   RESP: Unlabored breathing. Lungs CTAB, no wheezes, rales or rhonchi appreciated. Equal expansion bilaterally.    ABD:  Soft, nontender, nondistended. No hepatosplenomegaly.    MS:  No joint deformity or instability. No atrophy.   Neuro:  5/5 strength bilateral upper extremities and lower extremities. A+Ox3.   Ext:  No edema.  2+ radial and dp pulses bilaterally.   Skin: Warm & dry. No rashes, lesions, or ulcers.  Good turgor.    Psych: normal mood and affect       Code status and advanced care plan: Full         Point of Contact  Jerrica Bulluck??   Relationship:??God-Daughter   [phone])??[phone]        Patient Education:  Patient was educated on the following topics prior to discharge:   - COVID-19      RISK CALCULATORS:      SCORE  RESULT        READMISSION RISK SCORE  Low Risk              12 Total Score     3 Patient Length of Stay (>5 days = 3)     9 Pt. Coverage (Medicare=5 , Medicaid, or Self-Pay=4)          Criteria that do not apply:     Has Seen PCP in Last 6 Months (Yes=3, No=0)     Married. Living with Significant Other. Assisted Living. LTAC. SNF. or    Rehab     IP Visits Last 12 Months  (1-3=4, 4=9, >4=11)     Charlson Comorbidity Score (Age + Comorbid Conditions)  Follow-up:      Follow-up Information               Follow up With  Specialties  Details  Why  Contact Info              Larae Grooms, MD  Family Medicine  On 05/14/2020  at 2pm for hospital follow up with [place] for [facility] Follow up  [address]   [address]   [place] [address]   [phone]                   =================================================================        Angus Seller, DO, PGY-1     [facility] Medicine       May 08, 2020, 11:33 PM

## 2020-05-08 NOTE — Progress Notes (Signed)
Progress  Notes by Jethro Bastos, MD at 05/08/20 1510                Author: Jethro Bastos, MD  Service: FAMILY MEDICINE  Author Type: Resident       Filed: 05/08/20 1516  Date of Service: 05/08/20 1510  Status: Signed           Editor: Jethro Bastos, MD (Resident)  Cosigner: Caryl Ada, MD at 05/09/20 1136                       Brief Progress Note   [facility]      Evaluated pt at bedside. Pt insisting on resting until tomorrow AM, given fatigue and fever. Attempted to explain to her that she does not currently need O2, is not altered, and does  not require IV antibiotics, has a disposition after discharge from the hospital and therefore does need to stay in the hospital. Pt acknowledged this fact however stated that she cannot afford Tylenol or Decadron.      Will attempt to discharge tomorrow if condition improves.          Jethro Bastos, MD, PGY-3       [facility]       May 08, 2020, 3:10 PM

## 2020-05-08 NOTE — Progress Notes (Signed)
Progress Notes by Benedetto Goad, DO at 05/08/20 7673                Author: Benedetto Goad, DO  Service: FAMILY MEDICINE  Author Type: Resident       Filed: 05/08/20 0707  Date of Service: 05/08/20 0659  Status: Attested           Editor: Benedetto Goad, DO (Resident)  Cosigner: Caryl Ada, MD at 05/08/20 1131          Attestation signed by Caryl Ada, MD at 05/08/20 1131                 I have seen and examined the patient. I reviewed the resident's note and agree with below.     Breathing better. Suggest help with stopping smoking.      Caryl Ada, MD   05/08/2020, 11:30 AM                                       Intern Progress Note   [facility]              Patient: Mary [name]  MRN: [id number]   CSN: [id number]          Date of Birth: 10/20/1965   Age: 54 y.o.   Sex: female          DOA: 05/03/2020  LOS:  LOS: 5 days                          Subjective:            Had a vasovagal syncope event yesterday while toileting.  PM check overnight, BP improving with IVF.        Patient seen and examined this morning.  States she is feeling a lot better from yesterday, wishes to go home today.  States cannot afford her medications at the pharmacy, has checked with her family for assistance.  Cough and breathing improved. She  is drinking more and has more energy.        Review of Systems    Constitutional: Negative for chills, fever and malaise/fatigue.    Respiratory: Negative for cough, hemoptysis, sputum production, shortness of breath and wheezing.     Cardiovascular: Negative for chest pain, palpitations, orthopnea and leg swelling.    Gastrointestinal: Negative for abdominal pain, constipation, diarrhea, heartburn, nausea and vomiting.    Musculoskeletal: Negative for myalgias.    Skin: Negative for rash.    Neurological: Negative for headaches.            Objective:        Patient Vitals for the past 12 hrs:            Temp  Pulse  Resp  BP  SpO2             05/08/20 0245  98.9 ??F (37.2 ??C)  85  20  111/69  92 %            05/07/20 2340  99.7 ??F (37.6 ??C)  75  18  92/60  93 %     05/07/20 1942  --  --  --  97/60  --            05/07/20 1941  98.9 ??F (37.2 ??C)  83  20  (!) 90/57  94 %               Intake/Output Summary (Last 24 hours) at 05/08/2020 0659   Last data filed at 05/08/2020 0644     Gross per 24 hour        Intake  4790 ml        Output  3200 ml        Net  1590 ml           Physical Exam:    General: Lying in bed, NAD, AOx3, NC in place    CV:  RRR, no M/G/R   RESP: Unlabored breathing. No wheezing, rhonchi and rales. Equal expansion bilaterally.    ABD:  Soft, nontender, nondistended. No hepatosplenomegaly.    MS:  No joint deformity or instability. No atrophy.   Ext:  No edema.  2+ radial and dp pulses bilaterally.   Skin: Warm & dry. No rashes, lesions, or ulcers.  Good turgor.    Psych: normal mood and affect       Lab/Data Reviewed:     Recent Results (from the past 24 hour(s))     EKG, 12 LEAD, INITIAL          Collection Time: 05/07/20  1:40 PM         Result  Value  Ref Range            Ventricular Rate  65  BPM       Atrial Rate  65  BPM       P-R Interval  156  ms       QRS Duration  84  ms       Q-T Interval  398  ms       QTC Calculation (Bezet)  413  ms       Calculated P Axis  92  degrees       Calculated R Axis  44  degrees       Calculated T Axis  75  degrees       Diagnosis                 Normal sinus rhythm   Nonspecific ST and T wave abnormality   Abnormal ECG   When compared with ECG of 03-May-2020 09:29,   ST no longer depressed in Inferior leads   T wave inversion less evident in Lateral leads          TROPONIN-HIGH SENSITIVITY          Collection Time: 05/07/20  2:15 PM         Result  Value  Ref Range            Troponin-High Sensitivity  17  0 - 54 ng/L       CBC WITH AUTOMATED DIFF          Collection Time: 05/07/20  3:30 PM         Result  Value  Ref Range            WBC  3.6 (L)  4.6 - 13.2 K/uL       RBC  4.65  4.20 -  5.30 M/uL       HGB  13.7  12.0 - 16.0 g/dL       HCT  10.2  72.5 - 45.0 %       MCV  91.0  78.0 - 100.0 FL       MCH  29.5  24.0 - 34.0 PG       MCHC  32.4  31.0 - 37.0 g/dL       RDW  16.1  09.6 - 14.5 %       PLATELET  137  135 - 420 K/uL       MPV  10.5  9.2 - 11.8 FL       NRBC  0.0  0 PER 100 WBC       ABSOLUTE NRBC  0.00  0.00 - 0.01 K/uL       NEUTROPHILS  60  40 - 73 %       LYMPHOCYTES  34  21 - 52 %       MONOCYTES  6  3 - 10 %       EOSINOPHILS  0  0 - 5 %       BASOPHILS  0  0 - 2 %       IMMATURE GRANULOCYTES  0  0.0 - 0.5 %       ABS. NEUTROPHILS  2.2  1.8 - 8.0 K/UL       ABS. LYMPHOCYTES  1.2  0.9 - 3.6 K/UL       ABS. MONOCYTES  0.2  0.05 - 1.2 K/UL       ABS. EOSINOPHILS  0.0  0.0 - 0.4 K/UL       ABS. BASOPHILS  0.0  0.0 - 0.1 K/UL       ABS. IMM. GRANS.  0.0  0.00 - 0.04 K/UL       DF  AUTOMATED          METABOLIC PANEL, BASIC          Collection Time: 05/07/20  3:30 PM         Result  Value  Ref Range            Sodium  140  136 - 145 mmol/L       Potassium  3.6  3.5 - 5.5 mmol/L       Chloride  108  100 - 111 mmol/L       CO2  28  21 - 32 mmol/L       Anion gap  4  3.0 - 18 mmol/L       Glucose  92  74 - 99 mg/dL       BUN  33 (H)  7.0 - 18 MG/DL       Creatinine  0.45 (H)  0.6 - 1.3 MG/DL       BUN/Creatinine ratio  25 (H)  12 - 20         GFR est AA  51 (L)  >60 ml/min/1.28m2       GFR est non-AA  42 (L)  >60 ml/min/1.29m2       Calcium  8.1 (L)  8.5 - 10.1 MG/DL       LACTIC ACID          Collection Time: 05/07/20  3:30 PM         Result  Value  Ref Range            Lactic acid  1.1  0.4 - 2.0 MMOL/L       D DIMER          Collection Time: 05/07/20  3:30 PM         Result  Value  Ref Range  D DIMER  0.31  <0.46 ug/ml(FEU)       BLOOD GAS, ARTERIAL POC          Collection Time: 05/08/20  4:35 AM         Result  Value  Ref Range            pH (POC)  7.48 (H)  7.35 - 7.45         pCO2 (POC)  26.9 (L)  35.0 - 45.0 MMHG       pO2 (POC)  64 (L)  80 - 100 MMHG       HCO3 (POC)  19.9 (L)   22 - 26 MMOL/L       sO2 (POC)  93.9  92 - 97 %       Base deficit (POC)  2.4  mmol/L       Site  RIGHT RADIAL          Specimen type (POC)  ARTERIAL          Performed by  Olen Pel         CBC WITH AUTOMATED DIFF          Collection Time: 05/08/20  5:56 AM         Result  Value  Ref Range            WBC  3.0 (L)  4.6 - 13.2 K/uL       RBC  4.15 (L)  4.20 - 5.30 M/uL       HGB  12.1  12.0 - 16.0 g/dL       HCT  86.5  78.4 - 45.0 %       MCV  90.6  78.0 - 100.0 FL       MCH  29.2  24.0 - 34.0 PG       MCHC  32.2  31.0 - 37.0 g/dL       RDW  69.6  29.5 - 14.5 %       PLATELET  115 (L)  135 - 420 K/uL       MPV  10.2  9.2 - 11.8 FL       NRBC  0.0  0 PER 100 WBC       ABSOLUTE NRBC  0.00  0.00 - 0.01 K/uL       NEUTROPHILS  65  40 - 73 %       LYMPHOCYTES  28  21 - 52 %       MONOCYTES  6  3 - 10 %       EOSINOPHILS  0  0 - 5 %       BASOPHILS  0  0 - 2 %       IMMATURE GRANULOCYTES  0  0.0 - 0.5 %       ABS. NEUTROPHILS  1.9  1.8 - 8.0 K/UL       ABS. LYMPHOCYTES  0.8 (L)  0.9 - 3.6 K/UL       ABS. MONOCYTES  0.2  0.05 - 1.2 K/UL       ABS. EOSINOPHILS  0.0  0.0 - 0.4 K/UL       ABS. BASOPHILS  0.0  0.0 - 0.1 K/UL       ABS. IMM. GRANS.  0.0  0.00 - 0.04 K/UL       DF  AUTOMATED          D DIMER  Collection Time: 05/08/20  5:56 AM         Result  Value  Ref Range            D DIMER  0.27  <0.46 ug/ml(FEU)       PROTHROMBIN TIME + INR          Collection Time: 05/08/20  5:56 AM         Result  Value  Ref Range            Prothrombin time  22.7 (H)  11.5 - 15.2 sec            INR  2.0 (H)  0.8 - 1.2               Assessment & Plan:        This is a 54 y.o. 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 Fevers improved over past 72 hours  CXR stable.  Vasovagal event  yesterday during BM which delayed discharge, her VS are  improving and she is feeling better.  Currently on RA, will monitor throughout today and plan to discharge this afternoon if still stable and improved.     ??   1. Acute Hypoxic Respiratory failure 2/2 COVID-19 PNA:    Improving, fevers improved, requiring less oxygen this morning.  Cough improving.  Doxycycling started yesterday for thought of an atypical pneumonia.  Her CXR was stable yesterday from previous.  Will wean O2 and walk test today.  Labs continue to be  stable.      - Supplemental oxygen as needed, currently on RA    - PT/OT/CM    - Infectious disease following, appreciate recommendations     - Continue Doxycycline x 7 days    - Trend CBC, BMP, CRP, D-dimer, Ferritin, Procal    - Continue dexamethasone IV 6 mg daily for 10 days     - Contact precautions     - Continue home inhalers at this time     - Quarantine for 10 days after symptom onset/positive test   ??   2. pAfib currently NSR    - Continue Lovenox BID until discharger per ID     - INR at goal    - Pharmacy recommendations reviewed, appreciate assistance     - Hold Metoprolol, restart at outpatient follow up   ??   3. Bioprosthetic mitral valve replacement/Maze procedure/LAA appendage ligation in 2017/stable angina pectoris(s/p LHC):   LHC significant for Prox RCA lesion 30% stenosed.  Otherwise unremarkable.  BNP 1579, negative high-sensitivity troponin x 2    - Hold Lasix 40 mg, restart at outpatient follow up    - TTE results reviewed    ??   4. Tobacco dependence/questionable COPD:   Patient with 40-pack-year smoking history.  Current every day smoker.    - Continue home inhalers    - No nebulizer treatments in the setting of COVID-19 infection    - Nicotine patch if needed   ??   5. Bipolar 1 disorder/PTSD:   Written for Lamotrigine 75 mg daily by Psychiatrist but patient states taking 50mg  at 75mg  is too high, causes sedation and confusion    - Continue Lamotrigine 50mg  daily       6. AKI - resolving    -  Encourage PO intake    - Discontinue mIVF today   ??      Diet   Cardiac diet     DVT Prophylaxis   Lovenox 90 mg twice daily     GI Prophylaxis   Protonix 40 mg daily     Code status   Full     Disposition   Home     ??      Point of Contact  Jerrica Bulluck    Relationship: God-Daughter   [phone]             Benedetto GoadBrady S Octavius Shin, DO , PGY-1     [facility]     May 08, 2020, 7:04 AM

## 2020-05-08 NOTE — Progress Notes (Signed)
Call made to [employer] [facility] [phone], spoke to [place], dispatch will call the nurses station with ETA.  Patient is on Isolation due to Covid. Patient is going to [address], [id number].  Trip # [id number].

## 2020-05-08 NOTE — Progress Notes (Signed)
Per Attending, discharge for today is cancelled so cancel transport.            Sheila Oats, BSN RN  Care Management  Pager: [phone]

## 2020-05-08 NOTE — Progress Notes (Signed)
Pharmacist Consult: Warfarin Management    Assessment/Plan  INR is therapeutic: 2.0  Will give warfarin 5 mg as a one-time dose.   Recheck INR daily and adjust dose accordingly. Will continue to follow INR trend and patient's clinical progress daily.     Subjective/Objective  Patient on warfarin therapy for Atrial Fibrillation.   Target goal INR of 2 - 3  Patient is has been on warfarin PTA. Patient's home dose is 5 mg MWF, 3.75 mg TTSS.   Patient was here last week for cardiac cath. She was sent home with warfarin + lovenox bridge. Patient INR low at 1.3, compliance vs. Need for increased dose. She has been same dose since ~10/21.     Date 12/19 12/20 12/21  12/22 12/23 12/24     INR 1.3 1.3 1.6 1.7 2.1 2.0    Warfarin Dose (mg) 5 mg 5 mg 3.75 mg 5mg  3.75 5        Bleeding/Sensitivity Risk Factors: Hx variable INRs, Recent Trauma or Surgery, and Hypertension  Potentially interacting medications include:   Drugs that may increase INR: None  Drugs that may decrease INR: None  Other anticoagulants include: Enoxaparin    Thank you,  , [facility]  05/08/2020

## 2020-05-08 NOTE — Progress Notes (Signed)
Temp 102.4, tylenol and fluids given, MD aware, pt resting quiety in bed at this time

## 2020-05-09 LAB — METABOLIC PANEL, BASIC
Anion gap: 7 mmol/L (ref 3.0–18)
BUN/Creatinine ratio: 19 (ref 12–20)
BUN: 13 MG/DL (ref 7.0–18)
CO2: 23 mmol/L (ref 21–32)
Calcium: 7.7 MG/DL — ABNORMAL LOW (ref 8.5–10.1)
Chloride: 113 mmol/L — ABNORMAL HIGH (ref 100–111)
Creatinine: 0.69 MG/DL (ref 0.6–1.3)
GFR est AA: >60 mL/min/{1.73_m2} (ref >60)
GFR est non-AA: >60 mL/min/{1.73_m2} (ref >60)
Glucose: 116 mg/dL — ABNORMAL HIGH (ref 74–99)
Potassium: 4.3 mmol/L (ref 3.5–5.5)
Sodium: 143 mmol/L (ref 136–145)

## 2020-05-09 LAB — CBC WITH AUTOMATED DIFF
ABS. BASOPHILS: 0 10*3/uL (ref 0.0–0.1)
ABS. EOSINOPHILS: 0 10*3/uL (ref 0.0–0.4)
ABS. IMM. GRANS.: 0 10*3/uL (ref 0.00–0.04)
ABS. LYMPHOCYTES: 0.7 10*3/uL — ABNORMAL LOW (ref 0.9–3.6)
ABS. MONOCYTES: 0.2 10*3/uL (ref 0.05–1.2)
ABS. NEUTROPHILS: 1.4 10*3/uL — ABNORMAL LOW (ref 1.8–8.0)
ABSOLUTE NRBC: 0 10*3/uL (ref 0.00–0.01)
BASOPHILS: 0 % (ref 0–2)
EOSINOPHILS: 0 % (ref 0–5)
HCT: 37.4 % (ref 35.0–45.0)
HGB: 12.1 g/dL (ref 12.0–16.0)
IMMATURE GRANULOCYTES: 0 % (ref 0.0–0.5)
LYMPHOCYTES: 30 % (ref 21–52)
MCH: 29.3 PG (ref 24.0–34.0)
MCHC: 32.4 g/dL (ref 31.0–37.0)
MCV: 90.6 FL (ref 78.0–100.0)
MONOCYTES: 7 % (ref 3–10)
MPV: 11 FL (ref 9.2–11.8)
NEUTROPHILS: 63 % (ref 40–73)
NRBC: 0 PER 100 WBC
PLATELET: 127 10*3/uL — ABNORMAL LOW (ref 135–420)
RBC: 4.13 M/uL — ABNORMAL LOW (ref 4.20–5.30)
RDW: 13.4 % (ref 11.6–14.5)
WBC: 2.3 10*3/uL — ABNORMAL LOW (ref 4.6–13.2)

## 2020-05-09 LAB — PROTHROMBIN TIME + INR
INR: 2.3 — ABNORMAL HIGH (ref 0.8–1.2)
Prothrombin time: 24.9 s — ABNORMAL HIGH (ref 11.5–15.2)

## 2020-05-09 LAB — C REACTIVE PROTEIN, QT: C-Reactive protein: 1.1 mg/dL — ABNORMAL HIGH (ref 0–0.3)

## 2020-05-09 LAB — PROCALCITONIN
Procalcitonin: <0.05 ng/mL
Procalcitonin: <0.05 ng/mL

## 2020-05-09 LAB — D DIMER: D DIMER: <0.27 ug/ml(FEU) (ref <0.46)

## 2020-05-09 LAB — FERRITIN
Ferritin: 1072 NG/ML — ABNORMAL HIGH (ref 8–388)
Ferritin: 1072 NG/ML — ABNORMAL HIGH (ref 8–388)

## 2020-05-09 LAB — CBC WITH AUTO DIFFERENTIAL
Basophils %: 0 % (ref 0–2)
Basophils Absolute: 0 10*3/uL (ref 0.0–0.1)
Eosinophils %: 0 % (ref 0–5)
Eosinophils Absolute: 0 10*3/uL (ref 0.0–0.4)
Granulocyte Absolute Count: 0 10*3/uL (ref 0.00–0.04)
Hematocrit: 37.4 % (ref 35.0–45.0)
Hemoglobin: 12.1 g/dL (ref 12.0–16.0)
Immature Granulocytes: 0 % (ref 0.0–0.5)
Lymphocytes %: 30 % (ref 21–52)
Lymphocytes Absolute: 0.7 10*3/uL — ABNORMAL LOW (ref 0.9–3.6)
MCH: 29.3 PG (ref 24.0–34.0)
MCHC: 32.4 g/dL (ref 31.0–37.0)
MCV: 90.6 FL (ref 78.0–100.0)
MPV: 11 FL (ref 9.2–11.8)
Monocytes %: 7 % (ref 3–10)
Monocytes Absolute: 0.2 10*3/uL (ref 0.05–1.2)
NRBC Absolute: 0 10*3/uL (ref 0.00–0.01)
Neutrophils %: 63 % (ref 40–73)
Neutrophils Absolute: 1.4 10*3/uL — ABNORMAL LOW (ref 1.8–8.0)
Nucleated RBCs: 0 PER 100 WBC
Platelets: 127 10*3/uL — ABNORMAL LOW (ref 135–420)
RBC: 4.13 M/uL — ABNORMAL LOW (ref 4.20–5.30)
RDW: 13.4 % (ref 11.6–14.5)
WBC: 2.3 10*3/uL — ABNORMAL LOW (ref 4.6–13.2)

## 2020-05-09 LAB — BASIC METABOLIC PANEL
Anion Gap: 7 mmol/L (ref 3.0–18)
BUN: 13 MG/DL (ref 7.0–18)
Bun/Cre Ratio: 19 (ref 12–20)
CO2: 23 mmol/L (ref 21–32)
Calcium: 7.7 MG/DL — ABNORMAL LOW (ref 8.5–10.1)
Chloride: 113 mmol/L — ABNORMAL HIGH (ref 100–111)
Creatinine: 0.69 MG/DL (ref 0.6–1.3)
EGFR IF NonAfrican American: >60 mL/min/{1.73_m2} (ref >60)
GFR African American: >60 mL/min/{1.73_m2} (ref >60)
Glucose: 116 mg/dL — ABNORMAL HIGH (ref 74–99)
Potassium: 4.3 mmol/L (ref 3.5–5.5)
Sodium: 143 mmol/L (ref 136–145)

## 2020-05-09 LAB — D-DIMER, QUANTITATIVE: D-Dimer, Quant: <0.27 ug/ml(FEU) (ref <0.46)

## 2020-05-09 LAB — PROTIME-INR
INR: 2.3 — ABNORMAL HIGH (ref 0.8–1.2)
Protime: 24.9 s — ABNORMAL HIGH (ref 11.5–15.2)

## 2020-05-09 LAB — C-REACTIVE PROTEIN: CRP: 1.1 mg/dL — ABNORMAL HIGH (ref 0–0.3)

## 2020-05-09 MED ORDER — IPRATROPIUM 20 MCG-ALBUTEROL 100 MCG/ACTUATION MIST FOR INHALATION
20-100 mcg/actuation | Freq: Four times a day (QID) | RESPIRATORY_TRACT | 3 refills | Status: AC
Start: 2020-05-09 — End: ?

## 2020-05-09 MED ORDER — ALBUTEROL SULFATE HFA 90 MCG/ACTUATION AEROSOL INHALER
90 mcg/actuation | Freq: Four times a day (QID) | RESPIRATORY_TRACT | 3 refills | Status: DC | PRN
Start: 2020-05-09 — End: 2020-07-05

## 2020-05-09 MED ORDER — FLUTICASONE 50 MCG/ACTUATION NASAL SPRAY, SUSP
50 mcg/actuation | NASAL | 2 refills | Status: AC
Start: 2020-05-09 — End: ?

## 2020-05-09 MED ORDER — WARFARIN 7.5 MG TAB
7.5 mg | Freq: Every evening | ORAL | Status: AC
Start: 2020-05-09 — End: 2020-05-09
  Administered 2020-05-09: 23:00:00 via ORAL

## 2020-05-09 MED FILL — DEXTROMETHORPHAN-GUAIFENESIN 10 MG-100 MG/5 ML SYRUP: 100-10 mg/5 mL | ORAL | Qty: 10

## 2020-05-09 MED FILL — LAMOTRIGINE 25 MG TAB: 25 mg | ORAL | Qty: 2

## 2020-05-09 MED FILL — PRAVASTATIN 20 MG TAB: 20 mg | ORAL | Qty: 2

## 2020-05-09 MED FILL — ACETAMINOPHEN 500 MG TAB: 500 mg | ORAL | Qty: 2

## 2020-05-09 MED FILL — CETIRIZINE 10 MG TAB: 10 mg | ORAL | Qty: 1

## 2020-05-09 MED FILL — PREGABALIN 50 MG CAP: 50 mg | ORAL | Qty: 1

## 2020-05-09 MED FILL — IBUPROFEN 600 MG TAB: 600 mg | ORAL | Qty: 1

## 2020-05-09 MED FILL — COUMADIN 7.5 MG TABLET: 7.5 mg | ORAL | Qty: 1

## 2020-05-09 MED FILL — ENOXAPARIN 100 MG/ML SUB-Q SYRINGE: 100 mg/mL | SUBCUTANEOUS | Qty: 1

## 2020-05-09 MED FILL — PANTOPRAZOLE 40 MG TAB, DELAYED RELEASE: 40 mg | ORAL | Qty: 1

## 2020-05-09 MED FILL — DOXYCYCLINE HYCLATE 100 MG IV SOLUTION: 100 mg | INTRAVENOUS | Qty: 100

## 2020-05-09 MED FILL — CHOLECALCIFEROL (VITAMIN D3) 1,000 UNIT (25 MCG) TAB: ORAL | Qty: 2

## 2020-05-09 NOTE — Progress Notes (Signed)
Patient oxygen saturation is 87% on room air  Discharge cancelled. Oxygen to be order tomorrow by care management.

## 2020-05-09 NOTE — Progress Notes (Signed)
Pharmacist Consult: Warfarin Management    Assessment/Plan  INR is therapeutic: 2.3  Will give warfarin 3.75 mg as a one-time dose.   Recheck INR daily and adjust dose accordingly. Will continue to follow INR trend and patient's clinical progress daily.     Subjective/Objective  Patient on warfarin therapy for Atrial Fibrillation.   Target goal INR of 2 - 3  Patient is has been on warfarin PTA. Patient's home dose is 5 mg MWF, 3.75 mg TTSS.   Patient was here last week for cardiac cath. She was sent home with warfarin + lovenox bridge. Patient INR low at 1.3, compliance vs. Need for increased dose. She has been same dose since ~10/21.     Date 12/19 12/20 12/21  12/22 12/23 12/24  12/25   INR 1.3 1.3 1.6 1.7 2.1 2.0 2.3   Warfarin Dose (mg) 5 mg 5 mg 3.75 mg 5mg  3.75 5 3.75       Bleeding/Sensitivity Risk Factors: Hx variable INRs, Recent Trauma or Surgery, and Hypertension  Potentially interacting medications include:   Drugs that may increase INR: None  Drugs that may decrease INR: None  Other anticoagulants include: Enoxaparin    Thank you,  , [facility]  05/09/2020

## 2020-05-09 NOTE — Progress Notes (Signed)
Bedside shift report given to [facility], RN

## 2020-05-09 NOTE — Progress Notes (Signed)
Received page to call unit. Per RN note, pt needs home oxygen.  Spoke with Dr Rogue Bussing who states she and Dr Georgeanna Lea discussed pt and feel she is ok to go home without it. Spoke with Tammy at [facility] transport. Set up stretcher transport to home address of [address] in [place]. Pick up should be with [facility] within the next few hours. Trip #[id number]. Called unit and relayed message to RN Toniann Fail.  Discharge order noted for today. Orders reviewed. No needs identified at this time. Case Manager remains available if needed.  Gypsy Decant RN - Outcomes Manager  [phone]

## 2020-05-09 NOTE — Progress Notes (Signed)
Oxygen test     Oxygen saturation on 2Lvia N/C 92%  Oxygen Saturation on room air 88 -89%  Oxygen saturation ambulating in room on room air 86%   Oxygen saturation on 2L via N/C 90% -91%

## 2020-05-09 NOTE — Discharge Summary (Signed)
Discharge Summary by Raelyn Number, DO at 05/09/20 0347                Author: Raelyn Number, DO  Service: FAMILY MEDICINE  Author Type: Resident       Filed: 05/09/20 1129  Date of Service: 05/09/20 0851  Status: Attested Addendum          Editor: Raelyn Number, DO (Resident)       Related Notes: Original Note by Raelyn Number, DO (Resident) filed at 05/09/20 1120          Cosigner: Izora Ribas, MD at 05/09/20 1136          Attestation signed by Izora Ribas, MD at 05/09/20 1136                 I have seen and examined the patient. I reviewed the resident's note and agree with below.        Izora Ribas, MD   05/09/2020, 11:36 AM                                            Discharge Summary   [facility]             Patient: Mary [name]  MRN: [id number]   CSN: [id number]          Date of Birth: [birth year]   Age: 54 y.o.   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/stable angina pectoris   Bipolar 1 disorder   AKI      Important notes to PCP/ follow-up studies and evaluations    - Lasix held due to AKI while inpatient, restart at follow up when appropriate   - Metoprolol held during inpatient due to hypotension, restart at follow up   - No oxygen need upon discharge   - INR 2.3 upon discharge, used Lovenox s/p heart catheterization and while inpatient - restarted Coumadin upon discharge   - Total 10 days of Decadron (3 days after discharge)   - Total 7 days of Doxycycline (3 days after  discharge)   - COVID vaccine 3 months after discharge   - Albuterol, Combivent, and Flonase renewed at discharge.       Pending labs and studies:   None      Operative Procedures:    None      Discharge Medications:        Current Discharge Medication List              START taking these medications          Details        dexAMETHasone (Decadron) 6 mg tablet  Take 1 tab by mouth daily until complete starting the day after discharge  Indications: COVID-19   Qty: 4 Tablet, Refills:  0  Start date: 05/08/2020               doxycycline (MONODOX) 100 mg capsule  Take 1 Capsule by mouth two (2) times a day for 4 days.   Qty: 10 Capsule, Refills:  0   Start date: 05/08/2020, End date:  05/12/2020               cholecalciferol (VITAMIN D3) (1000 Units /25 mcg) tablet  Take 2 Tablets by mouth daily.   Qty: 30 Tablet, Refills:  3   Start date: 05/08/2020               guaiFENesin-dextromethorphan (ROBITUSSIN DM) 100-10 mg/5 mL syrup  Take 5 mL by mouth every six (6) hours for 10 days.   Qty: 200 mL, Refills:  0   Start date: 05/07/2020, End date:  05/17/2020                     CONTINUE these medications which have CHANGED          Details        !! albuterol (PROVENTIL HFA, VENTOLIN HFA, PROAIR HFA) 90 mcg/actuation inhaler  Take 2 Puffs by inhalation every six (6) hours as needed for Wheezing or Shortness of Breath.   Qty: 18 g, Refills:  3   Start date: 05/09/2020               !! ipratropium-albuteroL (Combivent Respimat) 20-100 mcg/actuation inhaler  Take 1 Puff by inhalation every six (6) hours.   Qty: 4 g, Refills:  3   Start date: 05/09/2020               !! fluticasone propionate (FLONASE) 50 mcg/actuation nasal spray  1 spray by nasal route daily   Qty: 1 Each, Refills:  2   Start date: 05/09/2020               !! - Potential duplicate medications found. Please discuss with provider.              CONTINUE these medications which have NOT CHANGED          Details        hydrOXYzine HCL (ATARAX) 25 mg tablet  Take  by  mouth nightly as needed.               cyclobenzaprine (FLEXERIL) 5 mg tablet  Take 5 mg by mouth nightly as needed for Muscle Spasm(s).               acetaminophen (Tylenol Extra Strength) 500 mg tablet  Take 1,000 mg by mouth every eight (8) hours as needed for Pain.               pregabalin (LYRICA) 200 mg capsule  take 1 capsule by mouth three times a day   Qty: 90 Capsule, Refills:  1          Associated Diagnoses: Neck pain; Chronic pain syndrome               !! albuterol (PROVENTIL HFA, VENTOLIN HFA, PROAIR HFA) 90 mcg/actuation inhaler  Take 2 Puffs by inhalation every six (6) hours as needed.               cetirizine (ZYRTEC) 10 mg tablet  Take 10 mg by mouth daily.               !! fluticasone propionate (FLONASE) 50 mcg/actuation nasal spray  1 Spray  by Nasal route daily.               !! Combivent Respimat 20-100 mcg/actuation inhaler  INHALE 1 PUFF BY MOUTH INTO LUNGS EVERY 6 HOURS               lamoTRIgine (LaMICtal) 25 mg tablet  TAKE 3 TABLETS BY MOUTH ONCE DAILY               metoprolol succinate (TOPROL-XL) 25 mg XL tablet                 omeprazole (PRILOSEC) 40 mg capsule  Take 40 mg by mouth daily.               pravastatin (PRAVACHOL) 40 mg tablet  Take 40 mg by mouth nightly.                 warfarin (COUMADIN) 2.5 mg tablet  TAKE 2 TABLETS BY MOUTH ONCE DAILY ON MONDAY WEDNESDAY FRIDAY AND TAKE 1 & 1 2 (ONE AND ONE HALF) ALL OTHER DAYS               !! - Potential duplicate medications found. Please discuss with provider.              STOP taking these medications                  enoxaparin (Lovenox) 40 mg/0.4 mL  Comments:    Reason for Stopping:                      furosemide (LASIX) 40 mg tablet  Comments:    Reason for Stopping:                      enoxaparin (Lovenox) 60 mg/0.6 mL injection  Comments:    Reason for Stopping:                             Disposition: Home      Consultants:     Infectious Disease       [facility] Course (including pertinent history and physical  findings)      Mary [name] is a 54 y.o. female with bioprosthetic mitral valve replacement/Maze procedure/LAA appendage ligation in 2017, Afib(on warfarin bridged with Lovenox), HTN, Smoker, Bipolar  1 disorder/PTSD, stable angina pectoris (s/p LHC) now admitted with acute hypoxic respiratory failure in the setting of COVID-19.   ??    Patient stated that her symptoms started yesterday and that she noted a fever of 101.46F day prior to admission.    ??   Of note patient is not vaccinated against COVID-19, and has never had COVID-19 infection in the past.      She was admitted to the hospitalist service due to her need for oxygen via NC.  She was started on Decadron 54m IV daily.  Infectious disease was consulted.  Her COVID-19 labs were trended during hospitalized and remained within normal limits.  She did  not have a CTA or PVL's to rule out PE or DVT as she had negative d-dimer throughout hospitalization.  On HD2, she was weaned from oxygen however required oxygen again that afternoon and stated she felt worse, having worsening cough and more fatigue.   She had multiple fevers that day.  Her procalcitonin and labs continued to be normal.  A repeat CXR showed no change from admission.  She was started on Doxycycline by ID due to concern for atypical pneumonia.  She was started on scheduled Tylenol and  Motrin which made patient more comfortable overnight.  She was watched an additional day while on 2-3L NC.  On 12/23, Her morning labs revealed an increase in Cr to 1.6 showing an AKI, patient admits to decreased PO intake the previous day due to fatigue.   She was started on mIVF after a 51m LR bolus.  [facility] and continued to improve and feel well.  That afternoon, patient was found hypotensive and bradycardic in the restroom while having a bowel movement, she was transferred  to bed.  EKG was normal, non-ischemic.  Her repeat lab work was stable.  She received another 5027mNS bolus  and continued mIVF.  She was watched closely overnight.  On the day of discharge, her blood pressure was improved and normotensive.  She was feeling  improved, not requiring oxygen and breathing normally.  She wished to be discharged home.  [facility]        Upon admission, patient's INR was subtherapeutic after warfarin was held prior to her heart catheterization, she was taking Lovenox as a bridge.  Her Lovenox was continued BID at a therapeutic dose.  She was continued on her warfarin dose and INR was  checked daily until it was at goal 2-3. It was 2.1, on the day of discharge.  She was discharged on her normal warfarin dosing schedule and her Lovenox was discontinued.        She was counseled extensively on quarantine and isolation in her home, recommending she remain isolated in her room and wear a mask when she is ambulating to restroom or kitchen.  She should remain isolated for 10 days after symptom onset.        She will receive a total of 7 days of Doxycycline and 10 days of Decadron.  She did not receive remdesivir during this hospitalization.          CURRENT ADMISSION IMAGING RESULTS       XR CHEST PORT      Result Date: 05/06/2020   No significant interval change. Hypoventilatory changes.      XR CHEST PORT      Result Date: 05/04/2020   Increasing streaky densities at the bases likely increasing small pleural effusions.      XR CHEST PORT      Result Date: 05/03/2020   Stable enlarged cardiac silhouette. Question hazy basilar opacities. Question small pleural effusion. Findings may reflect developing edema and/or pneumonitis. Correlate clinically. Follow-up can be obtained.            Cardiology Procedures/Testing:      MODALITY  RESULTS        EKG  Results for orders placed or performed during the hospital encounter of 05/03/20      EKG, 12 LEAD, INITIAL      Result  Value  Ref Range        Ventricular Rate  65  BPM        Atrial Rate   65  BPM        P-R Interval  156  ms        QRS Duration  84  ms        Q-T Interval  398  ms        QTC Calculation (  Bezet)  413  ms        Calculated P Axis  92  degrees        Calculated R Axis  44  degrees        Calculated T Axis  75  degrees        Diagnosis              Normal sinus rhythm   Nonspecific ST and T wave abnormality   Abnormal ECG   When compared with ECG of 03-May-2020 09:29,   ST no longer depressed in Inferior leads   T wave inversion less evident in Lateral leads   Confirmed by Lutricia Feil, MD, Marc [phone]) on 05/08/2020 2:26:48 PM                     ECHO  05/03/20      ECHO ADULT FOLLOW-UP OR LIMITED 05/04/2020 05/04/2020      Interpretation Summary   ?  COVID +   ?  Contrast used: Definity.   ?  Technical qualifiers: Echo study was technically difficult with poor endocardial visualization and limited due to patient's condition.   ?  Left??Ventricle: Left ventricle size is normal. Mildly increased wall thickness. See diagram for wall motion findings. EF by visual approximation is 50%.   ?  Right??Ventricle: Not well visualized.   ?  Aortic??Valve: Mild transvalvular regurgitation.   ?  Mitral??Valve: Bioprosthetic valve. Elevated prosthetic gradient. MV mean gradient is 9 mmHg.   ?  Pulmonary??Arteries: Pulmonary hypertension not present. The estimated pulmonary artery systolic pressure is 27 mmHg.      Signed by: Leland Her, MD on 05/04/2020  9:23 PM        IR  No results found for this or any previous visit.           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   Elevated LVEDP.   Recommend intense risk factor modification.      Signed by: Fredirick Maudlin, MD on 04/27/2020 10:52 AM           Special Testing/Procedures:      MODALITY  RESULTS        MICRO  All Micro Results         Procedure  Component  Value  Units  Date/Time        CULTURE, BLOOD [[id number]]          Order Status:  Canceled  Specimen: Blood          CULTURE, RESPIRATORY/SPUTUM/BRONCH Verdie Drown [[id number]]  Collected: 05/06/20 1300        Order Status: Canceled  Specimen: Sputum          RESPIRATORY VIRUS PANEL W/COVID-19, PCR [[id number]]  (Abnormal)  Collected: 05/03/20 1010        Order Status: Completed  Specimen: Nasopharyngeal  Updated: 05/03/20 1116          Adenovirus  Not detected               Coronavirus 229E  Not detected               Coronavirus HKU1  Not detected               Coronavirus CVNL63  Not detected  Coronavirus OC43  Not detected               SARS-CoV-2, PCR  Detected                 Comment:  NOTIFIED DAWN RN IN ED 05/03/20 BY VMB                Metapneumovirus  Not detected               Rhinovirus and Enterovirus  Not detected               Influenza A  Not detected               Influenza B  Not detected               Parainfluenza 1  Not detected               Parainfluenza 2  Not detected               Parainfluenza 3  Not detected               Parainfluenza virus 4  Not detected               RSV by PCR  Not detected               B. parapertussis, PCR  Not detected               Bordetella pertussis - PCR  Not detected               Chlamydophila pneumoniae DNA, QL, PCR  Not detected               Mycoplasma pneumoniae DNA, QL, PCR  Not detected                            ABG  Lab Results      Component  Value  Date/Time        pH (POC)  7.48 (H)  05/08/2020 04:35 AM        pCO2 (POC)  26.9 (L)  05/08/2020 04:35 AM        pO2 (POC)  64 (L)  05/08/2020 04:35 AM        HCO3 (POC)  19.9 (L)  05/08/2020 04:35 AM        FIO2 (POC)  32  05/03/2020 09:26 PM              UA  No results found for this or any previous visit.        Laboratory Results:      LABORATORY  RESULTS        HEMATOLOGY  Lab Results      Component  Value  Date/Time        WBC  2.3 (L)  05/09/2020 12:10 AM        HGB  12.1  05/09/2020 12:10 AM        HCT  37.4  05/09/2020 12:10 AM        PLATELET  127 (L)   05/09/2020 12:10 AM        MCV  90.6  05/09/2020 12:10 AM                  CHEMISTRIES  Lab Results      Component  Value  Date/Time  Sodium  143  05/09/2020 12:10 AM        Potassium  4.3  05/09/2020 12:10 AM        Chloride  113 (H)  05/09/2020 12:10 AM        CO2  23  05/09/2020 12:10 AM        Anion gap  7  05/09/2020 12:10 AM        Glucose  116 (H)  05/09/2020 12:10 AM        BUN  13  05/09/2020 12:10 AM        Creatinine  0.69  05/09/2020 12:10 AM        BUN/Creatinine ratio  19  05/09/2020 12:10 AM        GFR est AA  >60  05/09/2020 12:10 AM        GFR est non-AA  >60  05/09/2020 12:10 AM        Calcium  7.7 (L)  05/09/2020 12:10 AM                 HEPATIC FUNCTION  Lab Results      Component  Value  Date/Time        Albumin  4.2  05/03/2020 09:40 AM        Bilirubin, total  0.6  05/03/2020 09:40 AM        Protein, total  7.3  05/03/2020 09:40 AM        Globulin  3.1  05/03/2020 09:40 AM        A-G Ratio  1.4  05/03/2020 09:40 AM        ALT (SGPT)  32  05/03/2020 09:40 AM        Alk. phosphatase  55  05/03/2020 09:40 AM                  LACTIC ACID  Lab Results      Component  Value  Date/Time        Lactic acid  1.1  05/07/2020 03:30 PM              CARDIAC PANEL  Lab Results      Component  Value  Date/Time        CK  211 (H)  04/05/2020 09:22 AM        CK - MB  1.9  04/05/2020 09:22 AM        CK-MB Index  0.9  04/05/2020 09:22 AM        Troponin-I, QT  <0.02  04/05/2020 12:38 PM           NT-proBNP  Lab Results      Component  Value  Date/Time        NT pro-BNP  1,579 (H)  05/03/2020 09:40 AM        NT pro-BNP  506  04/05/2020 09:22 AM        NT pro-BNP  441  12/26/2019 10:50 AM        NT pro-BNP  430  10/29/2019 11:32 AM              THYROID  Lab Results      Component  Value  Date/Time        TSH  1.88  04/22/2020 08:52 AM                 Functional status and cognitive function:     Ambulates without assistance   Status: alert, cooperative, no  distress, appears stated age   Condition: STABLE       Physical exam on day of discharge:     General: well-appearing, NAD   HEENT: Conjunctiva pink, sclera anicteric. PERRL. EOMI    CV:  RRR, no M/G/R   RESP: Unlabored breathing. Lungs CTAB, no wheezes, rales or rhonchi appreciated. Equal expansion bilaterally.    ABD:  Soft, nontender, nondistended. No hepatosplenomegaly.    MS:  No joint deformity or instability. No atrophy.   Neuro:  5/5 strength bilateral upper extremities and lower extremities. A+Ox3.   Ext:  No edema.  2+ radial and dp pulses bilaterally.   Skin: Warm & dry. No rashes, lesions, or ulcers.  Good turgor.    Psych: normal mood and affect       Code status and advanced care plan: Full         Point of Contact  Jerrica Bulluck??   Relationship:??God-Daughter   [phone]        Patient Education:  Patient was educated on the following topics prior to discharge:   - COVID-19      RISK CALCULATORS:      SCORE  RESULT        READMISSION RISK SCORE  Low Risk              12 Total Score     3 Patient Length of Stay (>5 days = 3)     9 Pt. Coverage (Medicare=5 , Medicaid, or Self-Pay=4)          Criteria that do not apply:     Has Seen PCP in Last 6 Months (Yes=3, No=0)     Married. Living with Significant Other. Assisted Living. LTAC. SNF. or    Rehab     IP Visits Last 12 Months (1-3=4, 4=9, >4=11)     Charlson Comorbidity Score (Age + Comorbid Conditions)             Follow-up:      Follow-up Information               Follow up With  Specialties  Details  Why  Contact Info              Larae Grooms, MD  Family Medicine  On 05/14/2020  at 2pm for hospital follow up with [place] [facility] for [facility] Follow up  [address]   [address]   [place] [address]   [phone]                 Izora Ribas, [place]      [address]   [address]   [place] [address]   [phone]                   =================================================================        Raelyn Number, DO, PGY-1     [facility]  Medicine       May 09, 2020, 8:52 AM

## 2020-05-09 NOTE — Progress Notes (Signed)
Progress Notes by Lisbeth Ply, MD at 05/09/20 0630                Author: Lisbeth Ply, MD  Service: FAMILY MEDICINE  Author Type: Resident       Filed: 05/09/20 0711  Date of Service: 05/09/20 0630  Status: Attested           Editor: Draxton Luu, Charmian Muff, MD (Resident)  Cosigner: Caryl Ada, MD at 05/09/20 1109          Attestation signed by Caryl Ada, MD at 05/09/20 1109                 I have seen and examined the patient. I reviewed the resident's note and agree with below.     Still coughing. On doxycycline. Feeling better. INR 2.3. Resume home coumadin. Home today      Caryl Ada, MD   05/09/2020, 11:08 AM                                       Intern Progress Note   [facility] Family Medicine              Patient: Mary [name]  MRN: [id number]   CSN: [id number]          Date of Birth: Nov 23, 1965   Age: 54 y.o.   Sex: female          DOA: 05/03/2020  LOS:  LOS: 6 days                          Subjective:         Acute Events: NAEO.      This AM, patient states she is feeling better. Explains that she was upset that she was about to be discharged yesterday when she had a fever and was not well. Still endorses muscle/back aches and poor appetite but denies fever/chills or SOB. States she  would be ready for discharge today if she is breathing well and does not like the idea of requiring home oxygen. She is also willing to discuss smoking cessation at [facility] visit.         ROS: As above.        Objective:        Patient Vitals for the past 12 hrs:            Temp  Pulse  Resp  BP  SpO2            05/09/20 0355  98.1 ??F (36.7 ??C)  74  20  102/65  93 %            05/08/20 2354  98.4 ??F (36.9 ??C)  62  20  112/70  94 %     05/08/20 1946  98.6 ??F (37 ??C)  84  24  103/68  93 %            05/08/20 1755  99.2 ??F (37.3 ??C)  94  22  123/73  92 %               Intake/Output Summary (Last 24 hours) at 05/09/2020 0451   Last data filed at 05/08/2020 0644     Gross per 24 hour        Intake   1500 ml        Output  250 ml        Net  1250 ml           Physical Exam:    General: Seated in armchair, NAD, AOx3, NC in place.    CV:  RRR, no M/G/R   RESP: Unlabored breathing. CTAB. Equal expansion bilaterally.    ABD:  Soft, nontender, nondistended.    MS:  No joint deformity or instability. No atrophy.   Ext:  1+ radial and dp pulses bilaterally. No edema, erythema, or tenderness.    Skin: Warm & dry. No rashes, lesions, or ulcers.  Good turgor.    Psych: Appropriate mood and affect.       Lab/Data Reviewed:     Recent Results (from the past 24 hour(s))     METABOLIC PANEL, BASIC          Collection Time: 05/08/20  5:56 AM         Result  Value  Ref Range            Sodium  141  136 - 145 mmol/L       Potassium  3.7  3.5 - 5.5 mmol/L       Chloride  113 (H)  100 - 111 mmol/L       CO2  20 (L)  21 - 32 mmol/L       Anion gap  8  3.0 - 18 mmol/L       Glucose  103 (H)  74 - 99 mg/dL       BUN  19 (H)  7.0 - 18 MG/DL       Creatinine  1.61  0.6 - 1.3 MG/DL       BUN/Creatinine ratio  23 (H)  12 - 20         GFR est AA  >60  >60 ml/min/1.21m2       GFR est non-AA  >60  >60 ml/min/1.60m2       Calcium  8.0 (L)  8.5 - 10.1 MG/DL       CBC WITH AUTOMATED DIFF          Collection Time: 05/08/20  5:56 AM         Result  Value  Ref Range            WBC  3.0 (L)  4.6 - 13.2 K/uL       RBC  4.15 (L)  4.20 - 5.30 M/uL       HGB  12.1  12.0 - 16.0 g/dL       HCT  09.6  04.5 - 45.0 %       MCV  90.6  78.0 - 100.0 FL       MCH  29.2  24.0 - 34.0 PG       MCHC  32.2  31.0 - 37.0 g/dL       RDW  40.9  81.1 - 14.5 %       PLATELET  115 (L)  135 - 420 K/uL       MPV  10.2  9.2 - 11.8 FL       NRBC  0.0  0 PER 100 WBC       ABSOLUTE NRBC  0.00  0.00 - 0.01 K/uL       NEUTROPHILS  65  40 - 73 %       LYMPHOCYTES  28  21 - 52 %       MONOCYTES  6  3 - 10 %       EOSINOPHILS  0  0 - 5 %       BASOPHILS  0  0 - 2 %       IMMATURE GRANULOCYTES  0  0.0 - 0.5 %       ABS. NEUTROPHILS  1.9  1.8 - 8.0 K/UL       ABS. LYMPHOCYTES  0.8 (L)   0.9 - 3.6 K/UL       ABS. MONOCYTES  0.2  0.05 - 1.2 K/UL       ABS. EOSINOPHILS  0.0  0.0 - 0.4 K/UL       ABS. BASOPHILS  0.0  0.0 - 0.1 K/UL       ABS. IMM. GRANS.  0.0  0.00 - 0.04 K/UL       DF  AUTOMATED          PROCALCITONIN          Collection Time: 05/08/20  5:56 AM         Result  Value  Ref Range            Procalcitonin  <0.05  ng/mL       C REACTIVE PROTEIN, QT          Collection Time: 05/08/20  5:56 AM         Result  Value  Ref Range            C-Reactive protein  0.6 (H)  0 - 0.3 mg/dL       D DIMER          Collection Time: 05/08/20  5:56 AM         Result  Value  Ref Range            D DIMER  0.27  <0.46 ug/ml(FEU)       FERRITIN          Collection Time: 05/08/20  5:56 AM         Result  Value  Ref Range            Ferritin  806 (H)  8 - 388 NG/ML       PROTHROMBIN TIME + INR          Collection Time: 05/08/20  5:56 AM         Result  Value  Ref Range            Prothrombin time  22.7 (H)  11.5 - 15.2 sec       INR  2.0 (H)  0.8 - 1.2         METABOLIC PANEL, BASIC          Collection Time: 05/09/20 12:10 AM         Result  Value  Ref Range            Sodium  143  136 - 145 mmol/L       Potassium  4.3  3.5 - 5.5 mmol/L       Chloride  113 (H)  100 - 111 mmol/L       CO2  23  21 - 32 mmol/L       Anion gap  7  3.0 - 18 mmol/L       Glucose  116 (H)  74 - 99 mg/dL       BUN  13  7.0 - 18 MG/DL  Creatinine  0.69  0.6 - 1.3 MG/DL       BUN/Creatinine ratio  19  12 - 20         GFR est AA  >60  >60 ml/min/1.4473m2       GFR est non-AA  >60  >60 ml/min/1.4073m2       Calcium  7.7 (L)  8.5 - 10.1 MG/DL       CBC WITH AUTOMATED DIFF          Collection Time: 05/09/20 12:10 AM         Result  Value  Ref Range            WBC  2.3 (L)  4.6 - 13.2 K/uL       RBC  4.13 (L)  4.20 - 5.30 M/uL       HGB  12.1  12.0 - 16.0 g/dL       HCT  16.137.4  09.635.0 - 45.0 %       MCV  90.6  78.0 - 100.0 FL       MCH  29.3  24.0 - 34.0 PG       MCHC  32.4  31.0 - 37.0 g/dL       RDW  04.513.4  40.911.6 - 14.5 %       PLATELET  127 (L)   135 - 420 K/uL       MPV  11.0  9.2 - 11.8 FL       NRBC  0.0  0 PER 100 WBC       ABSOLUTE NRBC  0.00  0.00 - 0.01 K/uL       NEUTROPHILS  63  40 - 73 %       LYMPHOCYTES  30  21 - 52 %       MONOCYTES  7  3 - 10 %       EOSINOPHILS  0  0 - 5 %       BASOPHILS  0  0 - 2 %            IMMATURE GRANULOCYTES  0  0.0 - 0.5 %            ABS. NEUTROPHILS  1.4 (L)  1.8 - 8.0 K/UL       ABS. LYMPHOCYTES  0.7 (L)  0.9 - 3.6 K/UL       ABS. MONOCYTES  0.2  0.05 - 1.2 K/UL       ABS. EOSINOPHILS  0.0  0.0 - 0.4 K/UL       ABS. BASOPHILS  0.0  0.0 - 0.1 K/UL       ABS. IMM. GRANS.  0.0  0.00 - 0.04 K/UL       DF  AUTOMATED          PROCALCITONIN          Collection Time: 05/09/20 12:10 AM         Result  Value  Ref Range            Procalcitonin  <0.05  ng/mL       C REACTIVE PROTEIN, QT          Collection Time: 05/09/20 12:10 AM         Result  Value  Ref Range            C-Reactive protein  1.1 (H)  0 - 0.3 mg/dL       D  DIMER          Collection Time: 05/09/20 12:10 AM         Result  Value  Ref Range            D DIMER  <0.27  <0.46 ug/ml(FEU)       FERRITIN          Collection Time: 05/09/20 12:10 AM         Result  Value  Ref Range            Ferritin  1,072 (H)  8 - 388 NG/ML       PROTHROMBIN TIME + INR          Collection Time: 05/09/20 12:10 AM         Result  Value  Ref Range            Prothrombin time  24.9 (H)  11.5 - 15.2 sec            INR  2.3 (H)  0.8 - 1.2               Assessment & Plan:        This is a 54 y.o. 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 Patient had fatigue and one fever in the past 24 hours, but fever has improved and she is feeling better. CXR stable. Vasovagal  event on 12/23 during BM which delayed discharge, but her VS are improved and  have been stable.  Will wean supplemental oxygen and monitor on RA today with plan to discharge this afternoon if still stable.     ??   1. Acute Hypoxic Respiratory failure 2/2 COVID-19 PNA:    Improving, fevers improved. Breathing and cough improving.  Doxycycling started for thought of an atypical pneumonia. CXR stable from previous.  Will wean O2 and walk test today.  Labs continue to be stable.      - Supplemental oxygen as needed, currently on RA    - PT/OT/CM    - Infectious disease following, appreciate recommendations     - Continue Doxycycline x 7 days    - Trend CBC, BMP, CRP, D-dimer, Ferritin, Procal    - Continue dexamethasone IV 6 mg daily for 10 days     - Contact precautions     - Continue home inhalers at this time     - Quarantine for 10 days after symptom onset/positive test   ??   2. pAfib currently NSR    - Continue Lovenox BID until discharger per ID     - INR at goal    - Pharmacy recommendations reviewed, appreciate assistance     - Hold Metoprolol, restart at outpatient follow up   ??   3. Bioprosthetic mitral valve replacement/Maze procedure/LAA appendage ligation in 2017/stable angina pectoris(s/p LHC):   LHC significant for Prox RCA lesion 30% stenosed.  Otherwise unremarkable.  BNP 1579, negative high-sensitivity troponin x 2    - Hold Lasix 40 mg, restart at outpatient follow up    - TTE results reviewed    ??   4. Tobacco dependence/questionable COPD:   Patient with 40-pack-year smoking history.  Current every day smoker.    - Continue home inhalers    - No nebulizer treatments in the  setting of COVID-19 infection    - Nicotine patch if needed, encourage cessation of tobacco use on discharge and follow-up   ??   5. Bipolar 1 disorder/PTSD:   Written for Lamotrigine 75 mg daily by Psychiatrist but patient states taking 50mg  at 75mg  is too high, causes sedation and confusion    - Continue Lamotrigine 50mg  daily      6. AKI - resolved   On 12/23, Cr 1.64. Cr now at or below baseline (Cr  0.9)    - Encourage PO intake   ??      Diet   Cardiac diet     DVT Prophylaxis   Lovenox 90 mg twice daily     GI Prophylaxis   Protonix 40 mg daily     Code status   Full     Disposition   Home     ??      Point of Contact  Jerrica Bulluck    Relationship: God-Daughter   [phone]             , MD , PGY-1     [facility] Medicine     May 09, 2020, 6:34 AM

## 2020-05-10 ENCOUNTER — Inpatient Hospital Stay: Admit: 2020-05-10 | Payer: MEDICARE | Primary: Family Medicine

## 2020-05-10 LAB — CBC WITH AUTOMATED DIFF
ABS. BASOPHILS: 0 10*3/uL (ref 0.0–0.1)
ABS. EOSINOPHILS: 0 10*3/uL (ref 0.0–0.4)
ABS. IMM. GRANS.: 0 10*3/uL
ABS. LYMPHOCYTES: 1 10*3/uL (ref 0.9–3.6)
ABS. MONOCYTES: 0 10*3/uL — ABNORMAL LOW (ref 0.05–1.2)
ABS. NEUTROPHILS: 2.2 10*3/uL (ref 1.8–8.0)
ABSOLUTE NRBC: 0 10*3/uL (ref 0.00–0.01)
BASOPHILS: 0 % (ref 0–2)
EOSINOPHILS: 0 % (ref 0–5)
HCT: 36.3 % (ref 35.0–45.0)
HGB: 11.9 g/dL — ABNORMAL LOW (ref 12.0–16.0)
IMMATURE GRANULOCYTES: 0 %
LYMPHOCYTES: 31 % (ref 21–52)
MCH: 29.6 PG (ref 24.0–34.0)
MCHC: 32.8 g/dL (ref 31.0–37.0)
MCV: 90.3 FL (ref 78.0–100.0)
MONOCYTES: 1 % — ABNORMAL LOW (ref 3–10)
MPV: 10.7 FL (ref 9.2–11.8)
NEUTROPHILS: 68 % (ref 40–73)
NRBC: 0 PER 100 WBC
PLATELET COMMENTS: ADEQUATE
PLATELET: 136 10*3/uL (ref 135–420)
RBC: 4.02 M/uL — ABNORMAL LOW (ref 4.20–5.30)
RDW: 13.7 % (ref 11.6–14.5)
WBC: 3.2 10*3/uL — ABNORMAL LOW (ref 4.6–13.2)

## 2020-05-10 LAB — METABOLIC PANEL, BASIC
Anion gap: 6 mmol/L (ref 3.0–18)
BUN/Creatinine ratio: 21 — ABNORMAL HIGH (ref 12–20)
BUN: 16 MG/DL (ref 7.0–18)
CO2: 26 mmol/L (ref 21–32)
Calcium: 7.9 MG/DL — ABNORMAL LOW (ref 8.5–10.1)
Chloride: 109 mmol/L (ref 100–111)
Creatinine: 0.76 MG/DL (ref 0.6–1.3)
GFR est AA: >60 mL/min/{1.73_m2} (ref >60)
GFR est non-AA: >60 mL/min/{1.73_m2} (ref >60)
Glucose: 100 mg/dL — ABNORMAL HIGH (ref 74–99)
Potassium: 3.7 mmol/L (ref 3.5–5.5)
Sodium: 141 mmol/L (ref 136–145)

## 2020-05-10 LAB — PROTHROMBIN TIME + INR
INR: 3.5 — ABNORMAL HIGH (ref 0.8–1.2)
Prothrombin time: 34.6 s — ABNORMAL HIGH (ref 11.5–15.2)

## 2020-05-10 LAB — PROCALCITONIN
Procalcitonin: <0.05 ng/mL
Procalcitonin: <0.05 ng/mL

## 2020-05-10 LAB — D DIMER: D DIMER: 0.39 ug/ml(FEU) (ref <0.46)

## 2020-05-10 LAB — C REACTIVE PROTEIN, QT: C-Reactive protein: 2.4 mg/dL — ABNORMAL HIGH (ref 0–0.3)

## 2020-05-10 LAB — FERRITIN
Ferritin: 1487 NG/ML — ABNORMAL HIGH (ref 8–388)
Ferritin: 1487 NG/ML — ABNORMAL HIGH (ref 8–388)

## 2020-05-10 LAB — CBC WITH AUTO DIFFERENTIAL
Basophils %: 0 % (ref 0–2)
Basophils Absolute: 0 10*3/uL (ref 0.0–0.1)
Eosinophils %: 0 % (ref 0–5)
Eosinophils Absolute: 0 10*3/uL (ref 0.0–0.4)
Granulocyte Absolute Count: 0 10*3/uL
Hematocrit: 36.3 % (ref 35.0–45.0)
Hemoglobin: 11.9 g/dL — ABNORMAL LOW (ref 12.0–16.0)
Immature Granulocytes: 0 %
Lymphocytes %: 31 % (ref 21–52)
Lymphocytes Absolute: 1 10*3/uL (ref 0.9–3.6)
MCH: 29.6 PG (ref 24.0–34.0)
MCHC: 32.8 g/dL (ref 31.0–37.0)
MCV: 90.3 FL (ref 78.0–100.0)
MPV: 10.7 FL (ref 9.2–11.8)
Monocytes %: 1 % — ABNORMAL LOW (ref 3–10)
Monocytes Absolute: 0 10*3/uL — ABNORMAL LOW (ref 0.05–1.2)
NRBC Absolute: 0 10*3/uL (ref 0.00–0.01)
Neutrophils %: 68 % (ref 40–73)
Neutrophils Absolute: 2.2 10*3/uL (ref 1.8–8.0)
Nucleated RBCs: 0 PER 100 WBC
Platelet Comment: ADEQUATE
Platelets: 136 10*3/uL (ref 135–420)
RBC: 4.02 M/uL — ABNORMAL LOW (ref 4.20–5.30)
RDW: 13.7 % (ref 11.6–14.5)
WBC: 3.2 10*3/uL — ABNORMAL LOW (ref 4.6–13.2)

## 2020-05-10 LAB — BASIC METABOLIC PANEL
Anion Gap: 6 mmol/L (ref 3.0–18)
BUN: 16 MG/DL (ref 7.0–18)
Bun/Cre Ratio: 21 — ABNORMAL HIGH (ref 12–20)
CO2: 26 mmol/L (ref 21–32)
Calcium: 7.9 MG/DL — ABNORMAL LOW (ref 8.5–10.1)
Chloride: 109 mmol/L (ref 100–111)
Creatinine: 0.76 MG/DL (ref 0.6–1.3)
EGFR IF NonAfrican American: >60 mL/min/{1.73_m2} (ref >60)
GFR African American: >60 mL/min/{1.73_m2} (ref >60)
Glucose: 100 mg/dL — ABNORMAL HIGH (ref 74–99)
Potassium: 3.7 mmol/L (ref 3.5–5.5)
Sodium: 141 mmol/L (ref 136–145)

## 2020-05-10 LAB — PROTIME-INR
INR: 3.5 — ABNORMAL HIGH (ref 0.8–1.2)
Protime: 34.6 s — ABNORMAL HIGH (ref 11.5–15.2)

## 2020-05-10 LAB — D-DIMER, QUANTITATIVE: D-Dimer, Quant: 0.39 ug/ml(FEU) (ref <0.46)

## 2020-05-10 LAB — C-REACTIVE PROTEIN: CRP: 2.4 mg/dL — ABNORMAL HIGH (ref 0–0.3)

## 2020-05-10 MED FILL — DOXYCYCLINE HYCLATE 100 MG IV SOLUTION: 100 mg | INTRAVENOUS | Qty: 100

## 2020-05-10 MED FILL — PRAVASTATIN 20 MG TAB: 20 mg | ORAL | Qty: 2

## 2020-05-10 MED FILL — PREGABALIN 50 MG CAP: 50 mg | ORAL | Qty: 1

## 2020-05-10 MED FILL — ACETAMINOPHEN 500 MG TAB: 500 mg | ORAL | Qty: 2

## 2020-05-10 MED FILL — IBUPROFEN 600 MG TAB: 600 mg | ORAL | Qty: 1

## 2020-05-10 MED FILL — DEXTROMETHORPHAN-GUAIFENESIN 10 MG-100 MG/5 ML SYRUP: 100-10 mg/5 mL | ORAL | Qty: 10

## 2020-05-10 MED FILL — CETIRIZINE 10 MG TAB: 10 mg | ORAL | Qty: 1

## 2020-05-10 MED FILL — CHOLECALCIFEROL (VITAMIN D3) 1,000 UNIT (25 MCG) TAB: ORAL | Qty: 2

## 2020-05-10 MED FILL — DEXAMETHASONE SODIUM PHOSPHATE 4 MG/ML IJ SOLN: 4 mg/mL | INTRAMUSCULAR | Qty: 2

## 2020-05-10 MED FILL — LAMOTRIGINE 25 MG TAB: 25 mg | ORAL | Qty: 2

## 2020-05-10 MED FILL — PANTOPRAZOLE 40 MG TAB, DELAYED RELEASE: 40 mg | ORAL | Qty: 1

## 2020-05-10 NOTE — Progress Notes (Signed)
Progress Notes by Jennye Boroughs, MD at 05/10/20 1942                Author: Jennye Boroughs, MD  Service: FAMILY MEDICINE  Author Type: Resident       Filed: 05/10/20 1957  Date of Service: 05/10/20 1942  Status: Signed           Editor: Jennye Boroughs, MD (Resident)  Cosigner: Caryl Ada, MD at 05/11/20 1828                       PM Note   [facility] Family Medicine           Subjective:         Patient seen at bedside this evening. She indicates that she continues to struggle with her breathing, however the oxygen is helping. She reports that she has been sweating while resting and would like to have her medical gown changed. She notes that  her headache from prior today has resolved. She continues to experience dysuria with right flank pain.         Objective:        Visit Vitals      BP  105/67 (BP 1 Location: Right upper arm, BP Patient Position: At rest)     Pulse  95     Temp  (!) 101.3 ??F (38.5 ??C)     Resp  18     Ht  5\' 3"  (1.6 m)     Wt  90.4 kg (199 lb 3.2 oz)     SpO2  93%     Breastfeeding  No        BMI  35.29 kg/m??           Physical Exam:    General: appears somewhat uncomfortable, NAD.   HEENT: Conjunctiva pink, sclera anicteric. PERRL. EOMI    CV:  RRR, no M/G/R.   RESP: Unlabored breathing. Lungs CTAB, no wheezes, rales or rhonchi appreciated. Equal expansion bilaterally.    ABD:  Soft, nontender, nondistended. No hepatosplenomegaly.    MS:  No joint deformity or instability. No atrophy.   Neuro:  A+Ox3.   Ext:  No edema.  2+ radial and dp pulses bilaterally.   Skin: Warm & dry. No rashes, lesions, or ulcers.  Good turgor.    Psych: Appropriate mood and affect.           Assessment & Plan:     Contacted nurse to re-check vitals and to change medical gown    Continue on 12 l/min O2   Follow up U/A with culture; continue on doxy          For full assessment and plan, please see daily progress note.         , MD, PGY-1     [facility] Medicine        May 10, 2020, 7:50 PM

## 2020-05-10 NOTE — Progress Notes (Signed)
Salter high flow cannula at 12lpm used to increase patient Spo2 from 88% to 93%.

## 2020-05-10 NOTE — Progress Notes (Signed)
 Pharmacist Consult: Warfarin Management    Assessment/Plan  1. INR is supratherapeutic: 3.5  2. Will hold warfarin today.   3. Recheck INR daily and adjust dose accordingly. Will continue to follow INR trend and patient's clinical progress daily.     Subjective/Objective  1. Patient on warfarin therapy for Atrial Fibrillation.   2. Target goal INR of 2 - 3  3. Patient is has been on warfarin PTA. Patient's home dose is 5 mg MWF, 3.75 mg TTSS.   4. Patient was here last week for cardiac cath. She was sent home with warfarin + lovenox  bridge. Patient INR low at 1.3, compliance vs. Need for increased dose. She has been same dose since ~10/21.     Date 12/26         INR 3.5         Warfarin Dose (mg) HOLD           Date 12/19 12/20 12/21  12/22 12/23 12/24  12/25   INR 1.3 1.3 1.6 1.7 2.1 2.0 2.3   Warfarin Dose (mg) 5 mg 5 mg 3.75 mg 5mg  3.75 5 3.75       Bleeding/Sensitivity Risk Factors: Hx variable INRs, Recent Trauma or Surgery, and Hypertension  Potentially interacting medications include:   SABRA Drugs that may increase INR: doxycycline  . Drugs that may decrease INR: None  Other anticoagulants include: None    Thank you,  Wilburn Cassis, [facility]  05/10/2020

## 2020-05-10 NOTE — Progress Notes (Signed)
Progress Notes by Sharma Covert, MD at 05/10/20 [phone]                Author: Sharma Covert, MD  Service: FAMILY MEDICINE  Author Type: Resident       Filed: 05/10/20 0837  Date of Service: 05/10/20 0829  Status: Attested           Editor: Sharma Covert, MD (Resident)  Cosigner: Caryl Ada, MD at 05/10/20 1128          Attestation signed by Caryl Ada, MD at 05/10/20 1128                 I have seen and examined the patient. I reviewed the resident's note and agree with below.     Patient feeling worse today with more cough and sputum and fatigue. Inflammatory markers mildly elevated. Concern for paying for meds at home. Continue antibiotics, Decadron and inhalers with neb treatments. INR elevated today. Warfarin on hold for today.      Caryl Ada, MD   05/10/2020, 11:25 AM                                       Intern Progress Note   [facility] Family Medicine              Patient: Mary [name]  MRN: [id number]   CSN: [id number]          Date of Birth: 1966/04/29   Age: 54 y.o.   Sex: female          DOA: 05/03/2020  LOS:  LOS: 7 days                          Subjective:         Attempted to wean O2 overnight.  Nurse reported patient dropped to 87% O2 saturation.  O2 increased back to 2L by NC.      Patient seen and examined this morning.  Reported that she had some brief chest pain when O2 decreased overnight, however as soon as O2 was increased back to 2L chest pain disappeared.  Reported headache since 3am, recently received tylenol      Review of Systems    Constitutional: Negative for chills, fever and malaise/fatigue.    Respiratory: Positive for cough. Negative for hemoptysis, sputum production, shortness of breath and wheezing.     Cardiovascular: Negative for chest pain, palpitations, orthopnea and leg swelling.    Gastrointestinal: Negative for abdominal pain, constipation, diarrhea, heartburn, nausea and vomiting.    Musculoskeletal: Negative for myalgias.     Skin: Negative for rash.    Neurological: Positive for headaches.            Objective:        Patient Vitals for the past 12 hrs:            Temp  Pulse  Resp  BP  SpO2            05/10/20 0545  97.7 ??F (36.5 ??C)  81  18  104/70  92 %            05/10/20 0120  98.2 ??F (36.8 ??C)  80  18  98/67  90 %            05/09/20 2118  98.2 ??F (36.8 ??C)  75  18  104/62  91 %               Intake/Output Summary (Last 24 hours) at 05/10/2020 0829   Last data filed at 05/09/2020 0844     Gross per 24 hour        Intake  --        Output  325 ml        Net  -325 ml           Physical Exam:    General: Lying in bed, NAD, AOx3, NC in place    CV:  RRR, no M/G/R   RESP: Unlabored breathing. No wheezing, rhonchi and rales. Equal expansion bilaterally.    ABD:  Soft, nontender, nondistended. No hepatosplenomegaly.    MS:  No joint deformity or instability. No atrophy.   Ext:  No edema.  2+ radial and dp pulses bilaterally.   Skin: Warm & dry. No rashes, lesions, or ulcers.  Good turgor.    Psych: normal mood and affect       Lab/Data Reviewed:     Recent Results (from the past 24 hour(s))     METABOLIC PANEL, BASIC          Collection Time: 05/10/20  1:42 AM         Result  Value  Ref Range            Sodium  141  136 - 145 mmol/L       Potassium  3.7  3.5 - 5.5 mmol/L       Chloride  109  100 - 111 mmol/L       CO2  26  21 - 32 mmol/L       Anion gap  6  3.0 - 18 mmol/L       Glucose  100 (H)  74 - 99 mg/dL       BUN  16  7.0 - 18 MG/DL       Creatinine  2.95  0.6 - 1.3 MG/DL       BUN/Creatinine ratio  21 (H)  12 - 20         GFR est AA  >60  >60 ml/min/1.63m2       GFR est non-AA  >60  >60 ml/min/1.72m2       Calcium  7.9 (L)  8.5 - 10.1 MG/DL       CBC WITH AUTOMATED DIFF          Collection Time: 05/10/20  1:42 AM         Result  Value  Ref Range            WBC  3.2 (L)  4.6 - 13.2 K/uL       RBC  4.02 (L)  4.20 - 5.30 M/uL       HGB  11.9 (L)  12.0 - 16.0 g/dL       HCT  18.8  41.6 - 45.0 %       MCV  90.3  78.0 - 100.0 FL        MCH  29.6  24.0 - 34.0 PG       MCHC  32.8  31.0 - 37.0 g/dL       RDW  60.6  30.1 - 14.5 %       PLATELET  136  135 - 420 K/uL       MPV  10.7  9.2 - 11.8 FL  NRBC  0.0  0 PER 100 WBC       ABSOLUTE NRBC  0.00  0.00 - 0.01 K/uL       NEUTROPHILS  68  40 - 73 %       LYMPHOCYTES  31  21 - 52 %       MONOCYTES  1 (L)  3 - 10 %       EOSINOPHILS  0  0 - 5 %       BASOPHILS  0  0 - 2 %       IMMATURE GRANULOCYTES  0  %       ABS. NEUTROPHILS  2.2  1.8 - 8.0 K/UL       ABS. LYMPHOCYTES  1.0  0.9 - 3.6 K/UL       ABS. MONOCYTES  0.0 (L)  0.05 - 1.2 K/UL       ABS. EOSINOPHILS  0.0  0.0 - 0.4 K/UL       ABS. BASOPHILS  0.0  0.0 - 0.1 K/UL       ABS. IMM. GRANS.  0.0  K/UL       DF  MANUAL          PLATELET COMMENTS  ADEQUATE PLATELETS          RBC COMMENTS  NORMOCYTIC, NORMOCHROMIC          PROCALCITONIN          Collection Time: 05/10/20  1:42 AM         Result  Value  Ref Range            Procalcitonin  <0.05  ng/mL       C REACTIVE PROTEIN, QT          Collection Time: 05/10/20  1:42 AM         Result  Value  Ref Range            C-Reactive protein  2.4 (H)  0 - 0.3 mg/dL       D DIMER          Collection Time: 05/10/20  1:42 AM         Result  Value  Ref Range            D DIMER  0.39  <0.46 ug/ml(FEU)       FERRITIN          Collection Time: 05/10/20  1:42 AM         Result  Value  Ref Range            Ferritin  1,487 (H)  8 - 388 NG/ML       PROTHROMBIN TIME + INR          Collection Time: 05/10/20  1:42 AM         Result  Value  Ref Range            Prothrombin time  34.6 (H)  11.5 - 15.2 sec            INR  3.5 (H)  0.8 - 1.2               Assessment & Plan:        This is a 54 y.o. female with PMH bioprosthetic mitral valve replacement/Maze procedure/LAA appendage ligation in 2017, Afib(on warfarin bridged with Lovenox), HTN, Smoker, Bipolar  1 disorder/PTSD, stable angina pectoris(s/p LHC) now admitted with acute hypoxic respiratory failure  in the setting of COVID-19. Fevers improved over past 72 hours  CXR  stable.  Vasovagal event yesterday during BM which delayed discharge, her VS are  improving and she is feeling better.  Currently on 2L by NC.  Case management working on home O2.  Disposition currently pending home O2.     ??   1. Acute Hypoxic Respiratory failure 2/2 COVID-19 PNA:    Improving, fevers improved, requiring less oxygen this morning.  Cough improving.  Doxycycline started 12/22 for thought of an atypical pneumonia.  Her CXR 12/22 was stable previous.  CRP (2.4 up from 1.1) and ferritin (1487 from 1072) increased today.       - Supplemental oxygen as needed, currently on 2L by NC    - PT/OT/CM    - Infectious disease following, appreciate recommendations     - Continue Doxycycline x 7 days (last day 12/28)    - Trend CBC, BMP, CRP, D-dimer, Ferritin, Procal    - Continue dexamethasone IV 6 mg daily for 10 days (last day 12/28)    - Contact precautions     - Continue home inhalers at this time     - Quarantine for 10 days after symptom onset/positive test   ??   2. pAfib currently NSR    - Lovenox 90mg  BID discontinued 12/24     - Pharmacy dosing warfarin     - INR today 3.5    - Pharmacy recommendations reviewed, appreciate assistance     - Hold Metoprolol, restart at outpatient follow up   ??   3. Bioprosthetic mitral valve replacement/Maze procedure/LAA appendage ligation in 2017/stable angina pectoris(s/p LHC):   LHC significant for Prox RCA lesion 30% stenosed.  Otherwise unremarkable.  BNP 1579, negative high-sensitivity troponin x 2    - Hold Lasix 40 mg, restart at outpatient follow up    - TTE results reviewed    ??   4. Tobacco dependence/questionable COPD:   Patient with 40-pack-year smoking history.  Current every day smoker.    - Continue home inhalers    - No nebulizer treatments in the setting of COVID-19 infection    - Nicotine patch if needed   ??   5. Bipolar 1 disorder/PTSD:   Written for Lamotrigine 75 mg daily by Psychiatrist but patient states taking 50mg  at 75mg  is too high, causes  sedation and confusion    - Continue Lamotrigine 50mg  daily      6. AKI - resolving    - Encourage PO intake    - Discontinue mIVF today   ??      Diet   Cardiac diet     DVT Prophylaxis   Lovenox 90 mg twice daily     GI Prophylaxis   Protonix 40 mg daily     Code status   Full     Disposition   Home     ??      Point of Contact  Jerrica Bulluck    Relationship: God-Daughter   [phone]             , MD , PGY-1     [facility] Medicine     May 10, 2020, 7:04 AM

## 2020-05-10 NOTE — Progress Notes (Signed)
Pt on 2L oxygen. O2 sats 87%. Pt placed on 3L and got up to 88%. Placed Pt on 5L of oxygen with humidity O2 90%.

## 2020-05-10 NOTE — Progress Notes (Signed)
Spo2 on 3lpm was 87%. I increased liter flow to 6 at this time. Pt tolerating 6lpm with spo2 of 91%.

## 2020-05-11 LAB — METABOLIC PANEL, BASIC
Anion gap: 7 mmol/L (ref 3.0–18)
BUN/Creatinine ratio: 22 — ABNORMAL HIGH (ref 12–20)
BUN: 15 MG/DL (ref 7.0–18)
CO2: 23 mmol/L (ref 21–32)
Calcium: 8.7 MG/DL (ref 8.5–10.1)
Chloride: 112 mmol/L — ABNORMAL HIGH (ref 100–111)
Creatinine: 0.69 MG/DL (ref 0.6–1.3)
GFR est AA: >60 mL/min/{1.73_m2} (ref >60)
GFR est non-AA: >60 mL/min/{1.73_m2} (ref >60)
Glucose: 130 mg/dL — ABNORMAL HIGH (ref 74–99)
Potassium: 3.7 mmol/L (ref 3.5–5.5)
Sodium: 142 mmol/L (ref 136–145)

## 2020-05-11 LAB — CBC WITH AUTOMATED DIFF
ABS. BASOPHILS: 0 10*3/uL (ref 0.0–0.1)
ABS. EOSINOPHILS: 0 10*3/uL (ref 0.0–0.4)
ABS. IMM. GRANS.: 0 10*3/uL
ABS. LYMPHOCYTES: 0.4 10*3/uL — ABNORMAL LOW (ref 0.9–3.6)
ABS. MONOCYTES: 0.1 10*3/uL (ref 0.05–1.2)
ABS. NEUTROPHILS: 2.5 10*3/uL (ref 1.8–8.0)
ABSOLUTE NRBC: 0 10*3/uL (ref 0.00–0.01)
BAND NEUTROPHILS: 1 % (ref 0–5)
BASOPHILS: 0 % (ref 0–2)
EOSINOPHILS: 0 % (ref 0–5)
HCT: 38.6 % (ref 35.0–45.0)
HGB: 12.3 g/dL (ref 12.0–16.0)
IMMATURE GRANULOCYTES: 0 %
LYMPHOCYTES: 14 % — ABNORMAL LOW (ref 21–52)
MCH: 28.8 PG (ref 24.0–34.0)
MCHC: 31.9 g/dL (ref 31.0–37.0)
MCV: 90.4 FL (ref 78.0–100.0)
MONOCYTES: 4 % (ref 3–10)
MPV: 10.4 FL (ref 9.2–11.8)
NEUTROPHILS: 81 % — ABNORMAL HIGH (ref 40–73)
NRBC: 0 PER 100 WBC
PLATELET COMMENTS: ADEQUATE
PLATELET: 219 10*3/uL (ref 135–420)
RBC: 4.27 M/uL (ref 4.20–5.30)
RDW: 13.6 % (ref 11.6–14.5)
WBC: 3 10*3/uL — ABNORMAL LOW (ref 4.6–13.2)

## 2020-05-11 LAB — PROTHROMBIN TIME + INR
INR: 4 — ABNORMAL HIGH (ref 0.8–1.2)
Prothrombin time: 38.7 s — ABNORMAL HIGH (ref 11.5–15.2)

## 2020-05-11 LAB — D DIMER: D DIMER: 0.53 ug/ml(FEU) — ABNORMAL HIGH (ref <0.46)

## 2020-05-11 LAB — PROCALCITONIN
Procalcitonin: <0.05 ng/mL
Procalcitonin: <0.05 ng/mL

## 2020-05-11 LAB — FERRITIN
Ferritin: 1849 NG/ML — ABNORMAL HIGH (ref 8–388)
Ferritin: 1849 NG/ML — ABNORMAL HIGH (ref 8–388)

## 2020-05-11 LAB — C REACTIVE PROTEIN, QT: C-Reactive protein: 9.5 mg/dL — ABNORMAL HIGH (ref 0–0.3)

## 2020-05-11 LAB — CBC WITH AUTO DIFFERENTIAL
Band Neutrophils: 1 % (ref 0–5)
Basophils %: 0 % (ref 0–2)
Basophils Absolute: 0 10*3/uL (ref 0.0–0.1)
Eosinophils %: 0 % (ref 0–5)
Eosinophils Absolute: 0 10*3/uL (ref 0.0–0.4)
Granulocyte Absolute Count: 0 10*3/uL
Hematocrit: 38.6 % (ref 35.0–45.0)
Hemoglobin: 12.3 g/dL (ref 12.0–16.0)
Immature Granulocytes: 0 %
Lymphocytes %: 14 % — ABNORMAL LOW (ref 21–52)
Lymphocytes Absolute: 0.4 10*3/uL — ABNORMAL LOW (ref 0.9–3.6)
MCH: 28.8 PG (ref 24.0–34.0)
MCHC: 31.9 g/dL (ref 31.0–37.0)
MCV: 90.4 FL (ref 78.0–100.0)
MPV: 10.4 FL (ref 9.2–11.8)
Monocytes %: 4 % (ref 3–10)
Monocytes Absolute: 0.1 10*3/uL (ref 0.05–1.2)
NRBC Absolute: 0 10*3/uL (ref 0.00–0.01)
Neutrophils %: 81 % — ABNORMAL HIGH (ref 40–73)
Neutrophils Absolute: 2.5 10*3/uL (ref 1.8–8.0)
Nucleated RBCs: 0 PER 100 WBC
Platelet Comment: ADEQUATE
Platelets: 219 10*3/uL (ref 135–420)
RBC: 4.27 M/uL (ref 4.20–5.30)
RDW: 13.6 % (ref 11.6–14.5)
WBC: 3 10*3/uL — ABNORMAL LOW (ref 4.6–13.2)

## 2020-05-11 LAB — D-DIMER, QUANTITATIVE: D-Dimer, Quant: 0.53 ug/ml(FEU) — ABNORMAL HIGH (ref <0.46)

## 2020-05-11 LAB — BASIC METABOLIC PANEL
Anion Gap: 7 mmol/L (ref 3.0–18)
BUN: 15 MG/DL (ref 7.0–18)
Bun/Cre Ratio: 22 — ABNORMAL HIGH (ref 12–20)
CO2: 23 mmol/L (ref 21–32)
Calcium: 8.7 MG/DL (ref 8.5–10.1)
Chloride: 112 mmol/L — ABNORMAL HIGH (ref 100–111)
Creatinine: 0.69 MG/DL (ref 0.6–1.3)
EGFR IF NonAfrican American: >60 mL/min/{1.73_m2} (ref >60)
GFR African American: >60 mL/min/{1.73_m2} (ref >60)
Glucose: 130 mg/dL — ABNORMAL HIGH (ref 74–99)
Potassium: 3.7 mmol/L (ref 3.5–5.5)
Sodium: 142 mmol/L (ref 136–145)

## 2020-05-11 LAB — PROTIME-INR
INR: 4 — ABNORMAL HIGH (ref 0.8–1.2)
Protime: 38.7 s — ABNORMAL HIGH (ref 11.5–15.2)

## 2020-05-11 LAB — C-REACTIVE PROTEIN: CRP: 9.5 mg/dL — ABNORMAL HIGH (ref 0–0.3)

## 2020-05-11 MED ORDER — LIDOCAINE 4 % TOPICAL PATCH (12 HOUR DURATION)
4 % | Freq: Every day | CUTANEOUS | Status: DC | PRN
Start: 2020-05-11 — End: 2020-05-15

## 2020-05-11 MED ORDER — BARICITINIB 2 MG TABLET
2 mg | Freq: Every day | ORAL | Status: DC
Start: 2020-05-11 — End: 2020-05-15
  Administered 2020-05-11 – 2020-05-15 (×5): via ORAL

## 2020-05-11 MED ORDER — DEXAMETHASONE SODIUM PHOSPHATE 4 MG/ML IJ SOLN
4 mg/mL | Freq: Two times a day (BID) | INTRAMUSCULAR | Status: DC
Start: 2020-05-11 — End: 2020-05-15
  Administered 2020-05-12 – 2020-05-15 (×8): via INTRAVENOUS

## 2020-05-11 MED FILL — CETIRIZINE 10 MG TAB: 10 mg | ORAL | Qty: 1

## 2020-05-11 MED FILL — PREGABALIN 50 MG CAP: 50 mg | ORAL | Qty: 1

## 2020-05-11 MED FILL — LAMOTRIGINE 25 MG TAB: 25 mg | ORAL | Qty: 2

## 2020-05-11 MED FILL — DOXYCYCLINE HYCLATE 100 MG IV SOLUTION: 100 mg | INTRAVENOUS | Qty: 100

## 2020-05-11 MED FILL — ACETAMINOPHEN 500 MG TAB: 500 mg | ORAL | Qty: 2

## 2020-05-11 MED FILL — DEXTROMETHORPHAN-GUAIFENESIN 10 MG-100 MG/5 ML SYRUP: 100-10 mg/5 mL | ORAL | Qty: 10

## 2020-05-11 MED FILL — IBUPROFEN 600 MG TAB: 600 mg | ORAL | Qty: 1

## 2020-05-11 MED FILL — CHOLECALCIFEROL (VITAMIN D3) 1,000 UNIT (25 MCG) TAB: ORAL | Qty: 2

## 2020-05-11 MED FILL — DEXAMETHASONE SODIUM PHOSPHATE 4 MG/ML IJ SOLN: 4 mg/mL | INTRAMUSCULAR | Qty: 2

## 2020-05-11 MED FILL — PANTOPRAZOLE 40 MG TAB, DELAYED RELEASE: 40 mg | ORAL | Qty: 1

## 2020-05-11 MED FILL — PRAVASTATIN 20 MG TAB: 20 mg | ORAL | Qty: 2

## 2020-05-11 MED FILL — OLUMIANT 2 MG TABLET: 2 mg | ORAL | Qty: 2

## 2020-05-11 NOTE — Progress Notes (Signed)
Progress Notes by Einar Pheasant, MD at 05/11/20 [phone]                Author: Einar Pheasant, MD  Service: FAMILY MEDICINE  Author Type: Resident       Filed: 05/11/20 0816  Date of Service: 05/11/20 0742  Status: Attested           Editor: Einar Pheasant, MD (Resident)  Cosigner: Caryl Ada, MD at 05/11/20 1157          Attestation signed by Caryl Ada, MD at 05/11/20 1157                 I have seen and examined the patient. I reviewed the resident's note and agree with below.        Coughing more, single fever yesterday. Reviewed CXR with radiologist and has RLL pneumonia. Has shift to left on CBC.    Lungs dry rales at lower 1/4 lungs. Has tenderness over right mid rib with 2   2cm ecchymosis on the skin there. This may be form her sleeping on her monitor box.    Increasing sx and Oxygen requirement with RLL infiltrate of pneumonia. Since she has been on doxy for atypicals will need to add another abx. Dr Carolanne Grumbling is consulting.      Caryl Ada, MD   05/11/2020, 11:51 AM                                       [facility]   Intern Progress Note         Patient: Mary [name]  MRN: [id number]        SSN: [id number]   Date of Birth: 1965/11/17        Age: 54 y.o.   Sex: female         Admit Date: 05/03/2020     LOS: 8 days      Chief Complaint       Patient presents with        ?  Shortness of Breath     ?  Chest Pain     ?  Cough        ?  Fever             Subjective:     No acute overnight events as per night team    Patient was afebrile overnight    Patient complaints of right rib pain that has been lasting the last 2 days.      ROS :    Denies chest pain, shortness of breath, abdominal pain, changes in urinary and bowel habits.        Objective:        Visit Vitals      BP  101/65 (BP 1 Location: Right upper arm, BP Patient Position: At rest)        Pulse  76     Temp  97.3 ??F (36.3 ??C)     Resp  19     Ht  5\' 3"  (1.6 m)     Wt  87.9 kg (193 lb 12.8 oz)     SpO2  96%      Breastfeeding  No        BMI  34.33 kg/m??           Physical Exam:    General:?? NAD, AOx3, NC in place.  CV: ??RRR, no M/G/R   RESP:??Unlabored breathing. CTAB. Equal expansion bilaterally.    ABD:????Soft, nontender, nondistended.    Ext:????No edema, erythema, or tenderness.    Skin: Warm & dry. No rashes, lesions, or ulcers.  Good turgor.    Psych: Appropriate mood and affect.??      Intake and Output:   Current Shift: No intake/output data recorded.   Last three shifts: 12/25 1901 - 12/27 0700   In: 600 [P.O.:600]   Out: 550 [Urine:550]      Lab/Data Review:     Recent Results (from the past 12 hour(s))     METABOLIC PANEL, BASIC          Collection Time: 05/11/20  4:25 AM         Result  Value  Ref Range            Sodium  142  136 - 145 mmol/L       Potassium  3.7  3.5 - 5.5 mmol/L       Chloride  112 (H)  100 - 111 mmol/L       CO2  23  21 - 32 mmol/L       Anion gap  7  3.0 - 18 mmol/L       Glucose  130 (H)  74 - 99 mg/dL       BUN  15  7.0 - 18 MG/DL       Creatinine  8.41  0.6 - 1.3 MG/DL       BUN/Creatinine ratio  22 (H)  12 - 20         GFR est AA  >60  >60 ml/min/1.59m2       GFR est non-AA  >60  >60 ml/min/1.36m2            Calcium  8.7  8.5 - 10.1 MG/DL       CBC WITH AUTOMATED DIFF          Collection Time: 05/11/20  4:25 AM         Result  Value  Ref Range            WBC  3.0 (L)  4.6 - 13.2 K/uL       RBC  4.27  4.20 - 5.30 M/uL       HGB  12.3  12.0 - 16.0 g/dL       HCT  32.4  40.1 - 45.0 %       MCV  90.4  78.0 - 100.0 FL       MCH  28.8  24.0 - 34.0 PG       MCHC  31.9  31.0 - 37.0 g/dL       RDW  02.7  25.3 - 14.5 %       PLATELET  219  135 - 420 K/uL       MPV  10.4  9.2 - 11.8 FL       NRBC  0.0  0 PER 100 WBC       ABSOLUTE NRBC  0.00  0.00 - 0.01 K/uL       NEUTROPHILS  PENDING  %       LYMPHOCYTES  PENDING  %       MONOCYTES  PENDING  %       EOSINOPHILS  PENDING  %       BASOPHILS  PENDING  %       IMMATURE GRANULOCYTES  PENDING  %       ABS. NEUTROPHILS  PENDING  K/UL       ABS.  LYMPHOCYTES  PENDING  K/UL       ABS. MONOCYTES  PENDING  K/UL       ABS. EOSINOPHILS  PENDING  K/UL       ABS. BASOPHILS  PENDING  K/UL       ABS. IMM. GRANS.  PENDING  K/UL       DF  PENDING         PROCALCITONIN          Collection Time: 05/11/20  4:25 AM         Result  Value  Ref Range            Procalcitonin  <0.05  ng/mL       C REACTIVE PROTEIN, QT          Collection Time: 05/11/20  4:25 AM         Result  Value  Ref Range            C-Reactive protein  9.5 (H)  0 - 0.3 mg/dL       D DIMER          Collection Time: 05/11/20  4:25 AM         Result  Value  Ref Range            D DIMER  0.53 (H)  <0.46 ug/ml(FEU)       FERRITIN          Collection Time: 05/11/20  4:25 AM         Result  Value  Ref Range            Ferritin  1,849 (H)  8 - 388 NG/ML       PROTHROMBIN TIME + INR          Collection Time: 05/11/20  4:25 AM         Result  Value  Ref Range            Prothrombin time  38.7 (H)  11.5 - 15.2 sec            INR  4.0 (H)  0.8 - 1.2                     Assessment and Plan:        This is a 54 y.o.??female??with PMH 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 Vasovagal event on 12/23 during BM which delayed discharge, but her VS are improved and have  been stable.     ??   1. Acute Hypoxic Respiratory failure 2/2 COVID-19 PNA:    Improving, fevers improved. Breathing and cough improving.  Doxycycling started for thought of an atypical pneumonia. CXR 12/26 Mild interstitial edema with small effusions and bibasilar  atelectasis, increased from the comparison exam. currently on high flow 15L.    PLAN:                 - continue hi-flow - wean as tolerated               - PT/OT/CM               - Infectious disease following, appreciate recommendations                -  Continue  Doxycycline 12/28               - Trend CBC, BMP, CRP, D-dimer, Ferritin, Procal               - Continue dexamethasone IV 6 mg daily beginning 12/19                - Contact precautions                - Continue home inhalers at this time                - Quarantine for 10 days after symptom onset/positive test   ??   2. pAfib currently NSR               - INR 2>2.3>3.5>4               - Pharmacy recommendations reviewed, appreciate assistance                - Hold Metoprolol, restart at outpatient follow up   ??   3. 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 ??BNP 1579,??negative high-sensitivity troponin x 2               - Hold Lasix 40 mg, restart at outpatient follow up               - TTE results reviewed    ??   4. Tobacco dependence/questionable COPD:   Patient with 40-pack-year smoking history. ??Current every day smoker.               - Continue home inhalers               - No nebulizer treatments in the setting of COVID-19 infection               - Nicotine patch if needed, encourage cessation of tobacco use on discharge and follow-up   ??   5. Bipolar 1 disorder/PTSD:   Written for Lamotrigine 75 mg daily by Psychiatrist but patient states taking 50mg  at 75mg  is too high, causes sedation and confusion               - Continue Lamotrigine 50mg  daily   ??   6. AKI - resolved   Cr - 1.3!>0.81>0.69>0.76>0.69               - Encourage PO intake   ??      Diet  ??Cardiac diet     DVT Prophylaxis  ??Lovenox 90 mg twice daily     GI Prophylaxis  ??Protonix 40 mg daily     Code status  ??Full     Disposition   Home     ??      Point of Contact  Mary [name]??   Relationship:??God-Daughter   [phone]             Apolonio SchneidersSheela R. Jshaun Abernathy MD, PGY-1     [facility]     Intern Pager: [phone]       May 11, 2020, 7:42 AM

## 2020-05-11 NOTE — Progress Notes (Signed)
Spoke with pt's nurse Toniann Fail.  Pt is on Hi flow oxygen 13L NC.   CM will continue to monitor and assist with d/c needs.          Sheila Oats, BSN RN  Care Management  Pager: [phone]

## 2020-05-11 NOTE — Progress Notes (Signed)
Pharmacist Consult: Warfarin Management    Assessment/Plan  INR is supratherapeutic: 4  Will hold warfarin today.   Recheck INR daily and adjust dose accordingly. Will continue to follow INR trend and patient's clinical progress daily.     Subjective/Objective  Patient on warfarin therapy for Atrial Fibrillation.   Target goal INR of 2 - 3  Patient is has been on warfarin PTA. Patient's home dose is 5 mg MWF, 3.75 mg TTSS.   Patient was here last week for cardiac cath. She was sent home with warfarin + lovenox bridge. Patient INR low at 1.3, compliance vs. Need for increased dose. She has been same dose since ~10/21.     Date 12/26 12/27        INR 3.5 4        Warfarin Dose (mg) HOLD HOLD          Date 12/19 12/20 12/21  12/22 12/23 12/24  12/25   INR 1.3 1.3 1.6 1.7 2.1 2.0 2.3   Warfarin Dose (mg) 5 mg 5 mg 3.75 mg 5mg  3.75 5 3.75       Bleeding/Sensitivity Risk Factors: Hx variable INRs, Recent Trauma or Surgery, and Hypertension  Potentially interacting medications include:   Drugs that may increase INR: doxycycline  Drugs that may decrease INR: None  Other anticoagulants include: None    Thank you,  , [facility]  05/11/2020

## 2020-05-11 NOTE — Progress Notes (Signed)
Infectious Disease progress Note        Reason: COVID-19 pneumonia    Current abx Prior abx   Doxycycline since 05/06/20      Lines:       Assessment :  54 y.o. 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    Malaise for about one week  S/p cardiac cath about a week ago    Clinical presentation consistent with acute hypoxia-present on admission due to COVID-19 pneumonia     ABG reviewed.  PO2-68.  Persistent hypoxia likely due to covid-19 infection superimposed on underlying emphysema    Increasing oxygen requirement-likely due to progressive COVID-19 pneumonia- currently on 30 L high flow oxygen    Worsening hypoxia, worsening CRP-high risk for clinical deterioration.  Hence will escalate care    Low procalcitonin argues against superimposed bacterial infection.  Cannot exclude superimposed gram-positive infection/atypical community-acquired pneumonia infection with full certainty (procalcitonin can remain low in these infections)      Recommendations:    1.  Increase Decadron to 10 mg IV every 12 hours.  Add baricitinib  2.  Continue doxycycline (d6/7), Robitussin-DM.   3. Continue diuretics per primary team  4.  Continue anticoagulation per primary team  5.  Monitor CRP, D-dimer, procalcitonin  6.  Titrate oxygen as tolerated         Above plan was discussed in details with patient,RN and primary team. Please call me if any further questions or concerns. Will continue to participate in the care of this patient.  HPI:  States that she does not feel too good.  Complains of feeling tired.   Denies pleuritic chest pain, abdominal pain, diarrhea, nausea.    Past Medical History:   Diagnosis Date   ??? Anxiety    ??? Asthma    ??? Bipolar 1 disorder (HCC)    ??? CHF (congestive heart  failure) (HCC)    ??? COPD (chronic obstructive pulmonary disease) (HCC)    ??? H/O aortic valve replacement    ??? PTSD (post-traumatic stress disorder)        Past Surgical History:   Procedure Laterality Date   ??? HX HEART CATHETERIZATION     ??? HX HEART VALVE SURGERY         home Medication List    Details   hydrOXYzine HCL (ATARAX) 25 mg tablet Take  by mouth nightly as needed.      enoxaparin (Lovenox) 40 mg/0.4 mL 60 mg by SubCUTAneous route every twelve (12) hours every twelve (12) hours.      cyclobenzaprine (FLEXERIL) 5 mg tablet Take 5 mg by mouth nightly as needed for Muscle Spasm(s).      acetaminophen (Tylenol Extra Strength) 500 mg tablet Take 1,000 mg by mouth every eight (8) hours as needed for Pain.      pregabalin (LYRICA) 200 mg capsule take 1 capsule by mouth three times a day  Qty: 90 Capsule, Refills: 1    Associated Diagnoses: Neck pain; Chronic pain syndrome      albuterol (PROVENTIL HFA, VENTOLIN HFA, PROAIR HFA) 90 mcg/actuation inhaler Take 2 Puffs by inhalation every six (6) hours as needed.      cetirizine (ZYRTEC) 10 mg tablet Take 10 mg by mouth daily.      fluticasone propionate (FLONASE) 50 mcg/actuation nasal spray 1  Spray by Nasal route daily.      furosemide (LASIX) 40 mg tablet TAKE ONE TABLET BY MOUTH EVERY DAY      Combivent Respimat 20-100 mcg/actuation inhaler INHALE 1 PUFF BY MOUTH INTO LUNGS EVERY 6 HOURS      lamoTRIgine (LaMICtal) 25 mg tablet TAKE 3 TABLETS BY MOUTH ONCE DAILY      metoprolol succinate (TOPROL-XL) 25 mg XL tablet       omeprazole (PRILOSEC) 40 mg capsule Take 40 mg by mouth daily.      pravastatin (PRAVACHOL) 40 mg tablet Take 40 mg by mouth nightly.        warfarin (COUMADIN) 2.5 mg tablet TAKE 2 TABLETS BY MOUTH ONCE DAILY ON MONDAY WEDNESDAY FRIDAY AND TAKE 1 & 1 2 (ONE AND ONE HALF) ALL OTHER DAYS             Current Facility-Administered Medications   Medication Dose Route Frequency   ??? ibuprofen (MOTRIN) tablet 600 mg  600 mg Oral Q6H   ??? doxycycline  (VIBRAMYCIN) 100 mg in 0.9% sodium chloride (MBP/ADV) 100 mL MBP  100 mg IntraVENous Q12H   ??? guaiFENesin-dextromethorphan (ROBITUSSIN DM) 100-10 mg/5 mL syrup 5 mL  5 mL Oral Q6H   ??? lamoTRIgine (LaMICtal) tablet 50 mg  50 mg Oral DAILY   ??? acetaminophen (TYLENOL) tablet 1,000 mg  1,000 mg Oral Q8H   ??? sodium chloride (NS) flush 5-40 mL  5-40 mL IntraVENous Q8H   ??? sodium chloride (NS) flush 5-40 mL  5-40 mL IntraVENous PRN   ??? ondansetron (ZOFRAN ODT) tablet 4 mg  4 mg Oral Q8H PRN    Or   ??? ondansetron (ZOFRAN) injection 4 mg  4 mg IntraVENous Q6H PRN   ??? cholecalciferol (VITAMIN D3) (1000 Units /25 mcg) tablet 2,000 Units  2,000 Units Oral DAILY   ??? dexamethasone (DECADRON) 4 mg/mL injection 6 mg  6 mg IntraVENous Q24H   ??? cetirizine (ZYRTEC) tablet 10 mg  10 mg Oral DAILY   ??? fluticasone propionate (FLONASE) 50 mcg/actuation nasal spray 2 Spray  2 Spray Both Nostrils DAILY   ??? [Held by provider] furosemide (LASIX) tablet 40 mg  40 mg Oral DAILY   ??? hydrOXYzine HCL (ATARAX) tablet 25 mg  25 mg Oral TID PRN   ??? pantoprazole (PROTONIX) tablet 40 mg  40 mg Oral DAILY   ??? pravastatin (PRAVACHOL) tablet 40 mg  40 mg Oral QHS   ??? pregabalin (LYRICA) capsule 200 mg  200 mg Oral TID   ??? [Held by provider] metoprolol succinate (TOPROL-XL) XL tablet 25 mg  25 mg Oral DAILY   ??? albuterol (PROVENTIL HFA, VENTOLIN HFA, PROAIR HFA) inhaler 2 Puff  2 Puff Inhalation Q6H PRN   ??? ipratropium-albuterol (COMBIVENT RESPIMAT) 20 mcg-100 mcg inhalation spray  1 Puff Inhalation Q6H RT   ??? Warfarin - Pharmacy to Dose   Other Rx Dosing/Monitoring       Allergies: Adhesive and Lithium    Family History   Problem Relation Age of Onset   ??? No Known Problems Mother      Social History     Socioeconomic History   ??? Marital status: DIVORCED     Spouse name: Not on file   ??? Number of children: Not on file   ??? Years of education: Not on file   ??? Highest education level: Not on file   Occupational History   ??? Not on file   Tobacco Use   ???  Smoking status: Current Every Day Smoker     Packs/day: 1.00   ??? Smokeless tobacco: Never Used   Substance and Sexual Activity   ??? Alcohol use: Never   ??? Drug use: Yes     Types: Marijuana   ??? Sexual activity: Not on file   Other Topics Concern   ??? Not on file   Social History Narrative   ??? Not on file     Social Determinants of Health     Financial Resource Strain:    ??? Difficulty of Paying Living Expenses: Not on file   Food Insecurity:    ??? Worried About Running Out of Food in the Last Year: Not on file   ??? Ran Out of Food in the Last Year: Not on file   Transportation Needs:    ??? Lack of Transportation (Medical): Not on file   ??? Lack of Transportation (Non-Medical): Not on file   Physical Activity:    ??? Days of Exercise per Week: Not on file   ??? Minutes of Exercise per Session: Not on file   Stress:    ??? Feeling of Stress : Not on file   Social Connections:    ??? Frequency of Communication with Friends and Family: Not on file   ??? Frequency of Social Gatherings with Friends and Family: Not on file   ??? Attends Religious Services: Not on file   ??? Active Member of Clubs or Organizations: Not on file   ??? Attends Banker Meetings: Not on file   ??? Marital Status: Not on file   Intimate Partner Violence:    ??? Fear of Current or Ex-Partner: Not on file   ??? Emotionally Abused: Not on file   ??? Physically Abused: Not on file   ??? Sexually Abused: Not on file   Housing Stability:    ??? Unable to Pay for Housing in the Last Year: Not on file   ??? Number of Places Lived in the Last Year: Not on file   ??? Unstable Housing in the Last Year: Not on file     Social History     Tobacco Use   Smoking Status Current Every Day Smoker   ??? Packs/day: 1.00   Smokeless Tobacco Never Used        Temp (24hrs), Avg:98.7 ??F (37.1 ??C), Min:97.3 ??F (36.3 ??C), Max:101.3 ??F (38.5 ??C)    Visit Vitals  BP 114/67 (BP 1 Location: Right upper arm, BP Patient Position: At rest)   Pulse 77   Temp 98.6 ??F (37 ??C)   Resp 19   Ht 5\' 3"  (1.6 m)   Wt  87.9 kg (193 lb 12.8 oz)   SpO2 93%   Breastfeeding No   BMI 34.33 kg/m??       ROS: 12 point ROS obtained in details. Pertinent positives as mentioned in HPI,   otherwise negative    Physical Exam:    Vitals signs and nursing note reviewed.   Constitutional:    Sitting on bed.  Alert awake oriented x3, appears tired  HENT:      Head: Normocephalic.   Eyes:      Conjunctiva/sclera: Conjunctivae normal.      Neck:      Musculoskeletal: Normal range of motion and neck supple.   Cardiovascular:      Rate and Rhythm: Normal rate and regular rhythm on monitor  Chest:      Bilateral chest movements equal.  Auscultation deferred  due to Covid positive  Abdominal:      General: There is no distension.      Palpations: Abdomen is soft.      Tenderness: There is no abdominal tenderness. There is no rebound.   Musculoskeletal: Normal range of motion.         General: No tenderness.   Skin:     General: Skin is warm and dry.      Findings: No rash.   Neurological:      Mental Status:  alert and oriented to person, place, and time.      Cranial Nerves: No cranial nerve deficit.      Motor: No abnormal muscle tone.      Coordination: Coordination normal.   Psychiatric:         Behavior: Behavior normal.         Thought Content: Thought content normal.         Judgment: Judgment normal.     Labs: Results:   Chemistry Recent Labs     05/11/20  0425 05/10/20  0142 05/09/20  0010   GLU 130* 100* 116*   NA 142 141 143   K 3.7 3.7 4.3   CL 112* 109 113*   CO2 23 26 23    BUN 15 16 13    CREA 0.69 0.76 0.69   CA 8.7 7.9* 7.7*   AGAP 7 6 7    BUCR 22* 21* 19      CBC w/Diff Recent Labs     05/11/20  0425 05/10/20  0142 05/09/20  0010   WBC 3.0* 3.2* 2.3*   RBC 4.27 4.02* 4.13*   HGB 12.3 11.9* 12.1   HCT 38.6 36.3 37.4   PLT 219 136 127*   GRANS 81* 68 63   LYMPH 14* 31 30   EOS 0 0 0      Microbiology No results for input(s): CULT in the last 72 hours.       RADIOLOGY:    All available imaging studies/reports in connect care for this  admission were reviewed  High complexity decision making was performed during the evaluation of this patient at high risk for decompensation with multiple organ involvement         Disclaimer: Sections of this note are dictated utilizing voice recognition software, which may have resulted in some phonetic based errors in grammar and contents. Even though attempts were made to correct all the mistakes, some may have been missed, and remained in the body of the document. If questions arise, please contact our department.    Dr. 05/13/20, Infectious Disease Specialist  [phone]  May 11, 2020  8:54 AM

## 2020-05-11 NOTE — Progress Notes (Signed)
Yesterday around 4pm patient endorsed complaints of dysuria and was febrile, urinalysis was ordered. Patient examined at bedside around 7:30pm and endorsed right flank pain. Spoke with nursing staff around 9pm and 2:25am in regards to urine collection and was told that it would be collected. Patient currently afebrile.          Jennye Boroughs, MD, PGY-1   [facility]   May 11, 2020, 2:28 AM

## 2020-05-12 ENCOUNTER — Inpatient Hospital Stay: Admit: 2020-05-12 | Payer: MEDICARE | Primary: Family Medicine

## 2020-05-12 LAB — CBC WITH AUTOMATED DIFF
ABS. BASOPHILS: 0 10*3/uL (ref 0.0–0.1)
ABS. EOSINOPHILS: 0 10*3/uL (ref 0.0–0.4)
ABS. IMM. GRANS.: 0 10*3/uL
ABS. LYMPHOCYTES: 0.6 10*3/uL — ABNORMAL LOW (ref 0.9–3.6)
ABS. MONOCYTES: 0 10*3/uL — ABNORMAL LOW (ref 0.05–1.2)
ABS. NEUTROPHILS: 4.6 10*3/uL (ref 1.8–8.0)
ABSOLUTE NRBC: 0 10*3/uL (ref 0.00–0.01)
BASOPHILS: 0 % (ref 0–2)
EOSINOPHILS: 0 % (ref 0–5)
HCT: 35.8 % (ref 35.0–45.0)
HGB: 11.5 g/dL — ABNORMAL LOW (ref 12.0–16.0)
IMMATURE GRANULOCYTES: 0 %
LYMPHOCYTES: 11 % — ABNORMAL LOW (ref 21–52)
MCH: 28.9 PG (ref 24.0–34.0)
MCHC: 32.1 g/dL (ref 31.0–37.0)
MCV: 89.9 FL (ref 78.0–100.0)
MONOCYTES: 0 % — ABNORMAL LOW (ref 3–10)
MPV: 10.1 FL (ref 9.2–11.8)
NEUTROPHILS: 89 % — ABNORMAL HIGH (ref 40–73)
NRBC: 0 PER 100 WBC
PLATELET COMMENTS: ADEQUATE
PLATELET: 274 10*3/uL (ref 135–420)
RBC: 3.98 M/uL — ABNORMAL LOW (ref 4.20–5.30)
RDW: 13.5 % (ref 11.6–14.5)
WBC: 5.2 10*3/uL (ref 4.6–13.2)

## 2020-05-12 LAB — PROTHROMBIN TIME + INR
INR: 3.6 — ABNORMAL HIGH (ref 0.8–1.2)
Prothrombin time: 35.3 s — ABNORMAL HIGH (ref 11.5–15.2)

## 2020-05-12 LAB — METABOLIC PANEL, BASIC
Anion gap: 5 mmol/L (ref 3.0–18)
BUN/Creatinine ratio: 23 — ABNORMAL HIGH (ref 12–20)
BUN: 16 MG/DL (ref 7.0–18)
CO2: 26 mmol/L (ref 21–32)
Calcium: 8.8 MG/DL (ref 8.5–10.1)
Chloride: 110 mmol/L (ref 100–111)
Creatinine: 0.71 MG/DL (ref 0.6–1.3)
GFR est AA: >60 mL/min/{1.73_m2} (ref >60)
GFR est non-AA: >60 mL/min/{1.73_m2} (ref >60)
Glucose: 131 mg/dL — ABNORMAL HIGH (ref 74–99)
Potassium: 3.9 mmol/L (ref 3.5–5.5)
Sodium: 141 mmol/L (ref 136–145)

## 2020-05-12 LAB — PROCALCITONIN
Procalcitonin: <0.05 ng/mL
Procalcitonin: <0.05 ng/mL

## 2020-05-12 LAB — D DIMER: D DIMER: 0.47 ug/ml(FEU) — ABNORMAL HIGH (ref <0.46)

## 2020-05-12 LAB — C REACTIVE PROTEIN, QT: C-Reactive protein: 3.4 mg/dL — ABNORMAL HIGH (ref 0–0.3)

## 2020-05-12 LAB — FERRITIN
Ferritin: 1924 NG/ML — ABNORMAL HIGH (ref 8–388)
Ferritin: 1924 NG/ML — ABNORMAL HIGH (ref 8–388)

## 2020-05-12 LAB — CBC WITH AUTO DIFFERENTIAL
Basophils %: 0 % (ref 0–2)
Basophils Absolute: 0 10*3/uL (ref 0.0–0.1)
Eosinophils %: 0 % (ref 0–5)
Eosinophils Absolute: 0 10*3/uL (ref 0.0–0.4)
Granulocyte Absolute Count: 0 10*3/uL
Hematocrit: 35.8 % (ref 35.0–45.0)
Hemoglobin: 11.5 g/dL — ABNORMAL LOW (ref 12.0–16.0)
Immature Granulocytes: 0 %
Lymphocytes %: 11 % — ABNORMAL LOW (ref 21–52)
Lymphocytes Absolute: 0.6 10*3/uL — ABNORMAL LOW (ref 0.9–3.6)
MCH: 28.9 PG (ref 24.0–34.0)
MCHC: 32.1 g/dL (ref 31.0–37.0)
MCV: 89.9 FL (ref 78.0–100.0)
MPV: 10.1 FL (ref 9.2–11.8)
Monocytes %: 0 % — ABNORMAL LOW (ref 3–10)
Monocytes Absolute: 0 10*3/uL — ABNORMAL LOW (ref 0.05–1.2)
NRBC Absolute: 0 10*3/uL (ref 0.00–0.01)
Neutrophils %: 89 % — ABNORMAL HIGH (ref 40–73)
Neutrophils Absolute: 4.6 10*3/uL (ref 1.8–8.0)
Nucleated RBCs: 0 PER 100 WBC
Platelet Comment: ADEQUATE
Platelets: 274 10*3/uL (ref 135–420)
RBC: 3.98 M/uL — ABNORMAL LOW (ref 4.20–5.30)
RDW: 13.5 % (ref 11.6–14.5)
WBC: 5.2 10*3/uL (ref 4.6–13.2)

## 2020-05-12 LAB — BASIC METABOLIC PANEL
Anion Gap: 5 mmol/L (ref 3.0–18)
BUN: 16 MG/DL (ref 7.0–18)
Bun/Cre Ratio: 23 — ABNORMAL HIGH (ref 12–20)
CO2: 26 mmol/L (ref 21–32)
Calcium: 8.8 MG/DL (ref 8.5–10.1)
Chloride: 110 mmol/L (ref 100–111)
Creatinine: 0.71 MG/DL (ref 0.6–1.3)
EGFR IF NonAfrican American: >60 mL/min/{1.73_m2} (ref >60)
GFR African American: >60 mL/min/{1.73_m2} (ref >60)
Glucose: 131 mg/dL — ABNORMAL HIGH (ref 74–99)
Potassium: 3.9 mmol/L (ref 3.5–5.5)
Sodium: 141 mmol/L (ref 136–145)

## 2020-05-12 LAB — C-REACTIVE PROTEIN: CRP: 3.4 mg/dL — ABNORMAL HIGH (ref 0–0.3)

## 2020-05-12 LAB — PROTIME-INR
INR: 3.6 — ABNORMAL HIGH (ref 0.8–1.2)
Protime: 35.3 s — ABNORMAL HIGH (ref 11.5–15.2)

## 2020-05-12 LAB — D-DIMER, QUANTITATIVE: D-Dimer, Quant: 0.47 ug/ml(FEU) — ABNORMAL HIGH (ref <0.46)

## 2020-05-12 MED FILL — IBUPROFEN 600 MG TAB: 600 mg | ORAL | Qty: 1

## 2020-05-12 MED FILL — ACETAMINOPHEN 500 MG TAB: 500 mg | ORAL | Qty: 2

## 2020-05-12 MED FILL — PREGABALIN 50 MG CAP: 50 mg | ORAL | Qty: 1

## 2020-05-12 MED FILL — DOXYCYCLINE HYCLATE 100 MG IV SOLUTION: 100 mg | INTRAVENOUS | Qty: 100

## 2020-05-12 MED FILL — CETIRIZINE 10 MG TAB: 10 mg | ORAL | Qty: 1

## 2020-05-12 MED FILL — LIDOCAINE 4 % TOPICAL PATCH (12 HOUR DURATION): 4 % | CUTANEOUS | Qty: 1

## 2020-05-12 MED FILL — DEXTROMETHORPHAN-GUAIFENESIN 10 MG-100 MG/5 ML SYRUP: 100-10 mg/5 mL | ORAL | Qty: 10

## 2020-05-12 MED FILL — PRAVASTATIN 20 MG TAB: 20 mg | ORAL | Qty: 2

## 2020-05-12 MED FILL — LAMOTRIGINE 25 MG TAB: 25 mg | ORAL | Qty: 2

## 2020-05-12 MED FILL — CHOLECALCIFEROL (VITAMIN D3) 1,000 UNIT (25 MCG) TAB: ORAL | Qty: 1

## 2020-05-12 MED FILL — DEXAMETHASONE SODIUM PHOSPHATE 4 MG/ML IJ SOLN: 4 mg/mL | INTRAMUSCULAR | Qty: 3

## 2020-05-12 MED FILL — PANTOPRAZOLE 40 MG TAB, DELAYED RELEASE: 40 mg | ORAL | Qty: 1

## 2020-05-12 MED FILL — OLUMIANT 2 MG TABLET: 2 mg | ORAL | Qty: 2

## 2020-05-12 NOTE — Progress Notes (Signed)
Nutrition Assessment     Type and Reason for Visit: Reassess,Positive nutrition screen    Nutrition Recommendations/Plan:   - Continue Ensure supplementation TID, clarify order with flavor preference and with meals.      Nutrition Assessment:  Worsening hypoxia requiring HFNC and RLL pneumonia. Tolerating diet with fair-good meal intake, consuming Ensure supplements, but only receiving 1 per day, will clarify order to provide with meals instead of snacks; pt receptive to drinking more than one per day.  Flavor preference of chocolate obtained.     Malnutrition Assessment:  Malnutrition Status: No malnutrition     Estimated Daily Nutrient Needs:  Energy (kcal):  1610-9604  Protein (g):  71-89       Fluid (ml/day):  5409-8119    Nutrition Related Findings:  BM 12/27.  10 L/min O2. Lasix held.       Current Nutrition Therapies:  ADULT ORAL NUTRITION SUPPLEMENT Lunch; Standard High Calorie/High Protein  ADULT ORAL NUTRITION SUPPLEMENT PM Snack, HS Snack; Standard High Calorie/High Protein  ADULT DIET Regular; Low Fat/Low Chol/High Fiber/NAS    Anthropometric Measures:   Height:  5\' 3"  (160 cm)   Current Body Wt:  89.4 kg (197 lb 1.5 oz)   BMI: 34.9    Nutrition Diagnosis:    Inadequate oral intake related to acute injury/trauma as evidenced by intake 51-75%      Nutrition Intervention:  Food and/or Nutrient Delivery: Continue current diet,Continue oral nutrition supplement  Nutrition Education and Counseling: No recommendations at this time  Coordination of Nutrition Care: Continue to monitor while inpatient    Goals:  PO nutrition intake will meet >75% of patient estimated nutritional needs within the next 7 days.       Nutrition Monitoring and Evaluation:   Behavioral-Environmental Outcomes: None identified  Food/Nutrient Intake Outcomes: Food and nutrient intake,Supplement intake  Physical Signs/Symptoms Outcomes: Biochemical data,Hemodynamic status,Meal time behavior,Nutrition focused physical  findings    Discharge Planning:    No discharge needs at this time (7)     Electronically signed by Drucilla Chalet, RD on 05/12/2020 at 11:48 AM    Contact Number: [phone]

## 2020-05-12 NOTE — Progress Notes (Signed)
[id number] - Patient c/o difficulty breathing with pain to left lateral chest. Oxygen saturation 86% on 9L.HFNC.  Respiratory paged and Dr. Amedeo Plenty (PFM).      1701- oxygen saturation 88% on 13 liter N/C    1723 - oxygen increased to 15L oxygen saturation 92%.  MD at bedside orders received to transfer pt to stepdown. Pending bed availability. Patient transferred to room 218 and placed on 50L Oxygen. Oxygen saturation is 98%.     1755 - patient Temp-99.2, BP-110/72, HR-81, Resp 20

## 2020-05-12 NOTE — Progress Notes (Signed)
Infectious Disease progress Note        Reason: COVID-19 pneumonia    Current abx Prior abx   Doxycycline since 05/06/20      Lines:       Assessment :  54 y.o. 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    Malaise for about one week  S/p cardiac cath about a week ago    Clinical presentation consistent with acute hypoxia-present on admission due to COVID-19 pneumonia     ABG reviewed.  PO2-68.  Persistent hypoxia likely due to covid-19 infection superimposed on underlying emphysema    Increasing oxygen requirement-likely due to progressive COVID-19 pneumonia- currently on 30 L high flow oxygen    Worsening hypoxia, worsening CRP on 05/11/2020-high risk for clinical deterioration.  Hence will escalate care    Low procalcitonin argues against superimposed bacterial infection.  Cannot exclude superimposed gram-positive infection/atypical community-acquired pneumonia infection with full certainty (procalcitonin can remain low in these infections)    Clinically better.  Improved inflammatory markers.  Improving oxygenation.  Subjective sense of improvement    Recommendations:    1.  Continue Decadron 10 mg IV every 12 hours.  Continue baricitinib  2.  Discontinue doxycycline after today's dose, continue Robitussin-DM.   3. Continue diuretics per primary team  4.  Continue anticoagulation per primary team  5.  Monitor CRP, D-dimer, procalcitonin  6.  Titrate oxygen as tolerated         Above plan was discussed in details with patient,RN and primary team. Please call me if any further questions or concerns. Will continue to participate in the care of this patient.    HPI:  States that she feels better.  Improved cough   Denies pleuritic chest pain, abdominal pain, diarrhea,  nausea.    Past Medical History:   Diagnosis Date   ??? Anxiety    ??? Asthma    ??? Bipolar 1 disorder (HCC)    ??? CHF (congestive heart failure) (HCC)    ??? COPD (chronic obstructive pulmonary disease) (HCC)    ??? H/O aortic valve replacement    ??? PTSD (post-traumatic stress disorder)        Past Surgical History:   Procedure Laterality Date   ??? HX HEART CATHETERIZATION     ??? HX HEART VALVE SURGERY         home Medication List    Details   hydrOXYzine HCL (ATARAX) 25 mg tablet Take  by mouth nightly as needed.      enoxaparin (Lovenox) 40 mg/0.4 mL 60 mg by SubCUTAneous route every twelve (12) hours every twelve (12) hours.      cyclobenzaprine (FLEXERIL) 5 mg tablet Take 5 mg by mouth nightly as needed for Muscle Spasm(s).      acetaminophen (Tylenol Extra Strength) 500 mg tablet Take 1,000 mg by mouth every eight (8) hours as needed for Pain.      pregabalin (LYRICA) 200 mg capsule take 1 capsule by mouth three times a day  Qty: 90 Capsule, Refills: 1    Associated Diagnoses: Neck pain; Chronic pain syndrome      albuterol (PROVENTIL HFA, VENTOLIN HFA, PROAIR HFA) 90 mcg/actuation inhaler Take 2 Puffs by inhalation every six (6) hours as needed.      cetirizine (ZYRTEC) 10 mg tablet Take 10 mg  by mouth daily.      fluticasone propionate (FLONASE) 50 mcg/actuation nasal spray 1 Spray by Nasal route daily.      furosemide (LASIX) 40 mg tablet TAKE ONE TABLET BY MOUTH EVERY DAY      Combivent Respimat 20-100 mcg/actuation inhaler INHALE 1 PUFF BY MOUTH INTO LUNGS EVERY 6 HOURS      lamoTRIgine (LaMICtal) 25 mg tablet TAKE 3 TABLETS BY MOUTH ONCE DAILY      metoprolol succinate (TOPROL-XL) 25 mg XL tablet       omeprazole (PRILOSEC) 40 mg capsule Take 40 mg by mouth daily.      pravastatin (PRAVACHOL) 40 mg tablet Take 40 mg by mouth nightly.        warfarin (COUMADIN) 2.5 mg tablet TAKE 2 TABLETS BY MOUTH ONCE DAILY ON MONDAY WEDNESDAY FRIDAY AND TAKE 1 & 1 2 (ONE AND ONE HALF) ALL OTHER DAYS             Current  Facility-Administered Medications   Medication Dose Route Frequency   ??? lidocaine 4 % patch 1 Patch  1 Patch TransDERmal DAILY PRN   ??? dexamethasone (DECADRON) 4 mg/mL injection 10 mg  10 mg IntraVENous Q12H   ??? baricitinib (OLUMIANT) tablet 4 mg  4 mg Oral DAILY   ??? ibuprofen (MOTRIN) tablet 600 mg  600 mg Oral Q6H   ??? doxycycline (VIBRAMYCIN) 100 mg in 0.9% sodium chloride (MBP/ADV) 100 mL MBP  100 mg IntraVENous Q12H   ??? guaiFENesin-dextromethorphan (ROBITUSSIN DM) 100-10 mg/5 mL syrup 5 mL  5 mL Oral Q6H   ??? lamoTRIgine (LaMICtal) tablet 50 mg  50 mg Oral DAILY   ??? acetaminophen (TYLENOL) tablet 1,000 mg  1,000 mg Oral Q8H   ??? sodium chloride (NS) flush 5-40 mL  5-40 mL IntraVENous Q8H   ??? sodium chloride (NS) flush 5-40 mL  5-40 mL IntraVENous PRN   ??? ondansetron (ZOFRAN ODT) tablet 4 mg  4 mg Oral Q8H PRN    Or   ??? ondansetron (ZOFRAN) injection 4 mg  4 mg IntraVENous Q6H PRN   ??? cholecalciferol (VITAMIN D3) (1000 Units /25 mcg) tablet 2,000 Units  2,000 Units Oral DAILY   ??? cetirizine (ZYRTEC) tablet 10 mg  10 mg Oral DAILY   ??? fluticasone propionate (FLONASE) 50 mcg/actuation nasal spray 2 Spray  2 Spray Both Nostrils DAILY   ??? [Held by provider] furosemide (LASIX) tablet 40 mg  40 mg Oral DAILY   ??? hydrOXYzine HCL (ATARAX) tablet 25 mg  25 mg Oral TID PRN   ??? pantoprazole (PROTONIX) tablet 40 mg  40 mg Oral DAILY   ??? pravastatin (PRAVACHOL) tablet 40 mg  40 mg Oral QHS   ??? pregabalin (LYRICA) capsule 200 mg  200 mg Oral TID   ??? [Held by provider] metoprolol succinate (TOPROL-XL) XL tablet 25 mg  25 mg Oral DAILY   ??? albuterol (PROVENTIL HFA, VENTOLIN HFA, PROAIR HFA) inhaler 2 Puff  2 Puff Inhalation Q6H PRN   ??? ipratropium-albuterol (COMBIVENT RESPIMAT) 20 mcg-100 mcg inhalation spray  1 Puff Inhalation Q6H RT   ??? Warfarin - Pharmacy to Dose   Other Rx Dosing/Monitoring       Allergies: Adhesive and Lithium    Family History   Problem Relation Age of Onset   ??? No Known Problems Mother      Social History      Socioeconomic History   ??? Marital status: DIVORCED     Spouse name: Not  on file   ??? Number of children: Not on file   ??? Years of education: Not on file   ??? Highest education level: Not on file   Occupational History   ??? Not on file   Tobacco Use   ??? Smoking status: Current Every Day Smoker     Packs/day: 1.00   ??? Smokeless tobacco: Never Used   Substance and Sexual Activity   ??? Alcohol use: Never   ??? Drug use: Yes     Types: Marijuana   ??? Sexual activity: Not on file   Other Topics Concern   ??? Not on file   Social History Narrative   ??? Not on file     Social Determinants of Health     Financial Resource Strain:    ??? Difficulty of Paying Living Expenses: Not on file   Food Insecurity:    ??? Worried About Running Out of Food in the Last Year: Not on file   ??? Ran Out of Food in the Last Year: Not on file   Transportation Needs:    ??? Lack of Transportation (Medical): Not on file   ??? Lack of Transportation (Non-Medical): Not on file   Physical Activity:    ??? Days of Exercise per Week: Not on file   ??? Minutes of Exercise per Session: Not on file   Stress:    ??? Feeling of Stress : Not on file   Social Connections:    ??? Frequency of Communication with Friends and Family: Not on file   ??? Frequency of Social Gatherings with Friends and Family: Not on file   ??? Attends Religious Services: Not on file   ??? Active Member of Clubs or Organizations: Not on file   ??? Attends Banker Meetings: Not on file   ??? Marital Status: Not on file   Intimate Partner Violence:    ??? Fear of Current or Ex-Partner: Not on file   ??? Emotionally Abused: Not on file   ??? Physically Abused: Not on file   ??? Sexually Abused: Not on file   Housing Stability:    ??? Unable to Pay for Housing in the Last Year: Not on file   ??? Number of Places Lived in the Last Year: Not on file   ??? Unstable Housing in the Last Year: Not on file     Social History     Tobacco Use   Smoking Status Current Every Day Smoker   ??? Packs/day: 1.00   Smokeless Tobacco  Never Used        Temp (24hrs), Avg:98.4 ??F (36.9 ??C), Min:98.2 ??F (36.8 ??C), Max:98.7 ??F (37.1 ??C)    Visit Vitals  BP 133/70 (BP 1 Location: Right upper arm, BP Patient Position: Sitting)   Pulse 78   Temp 98.6 ??F (37 ??C)   Resp 20   Ht 5\' 3"  (1.6 m)   Wt 88.4 kg (194 lb 12.8 oz)   SpO2 92% Comment: 10 liter via N/C   Breastfeeding No   BMI 34.51 kg/m??       ROS: 12 point ROS obtained in details. Pertinent positives as mentioned in HPI,   otherwise negative    Physical Exam:    Vitals signs and nursing note reviewed.   Constitutional:    Sitting on bed.  Alert awake oriented x3, appears more comfortable than prior exam  HENT:      Head: Normocephalic.   Eyes:  Conjunctiva/sclera: Conjunctivae normal.      Neck:      Musculoskeletal: Normal range of motion and neck supple.   Cardiovascular:      Rate and Rhythm: Normal rate and regular rhythm on monitor  Chest:      Bilateral chest movements equal.  Auscultation deferred due to Covid positive  Abdominal:      General: There is no distension.      Palpations: Abdomen is soft.      Tenderness: There is no abdominal tenderness. There is no rebound.   Musculoskeletal: Normal range of motion.         General: No tenderness.   Skin:     General: Skin is warm and dry.      Findings: No rash.   Neurological:      Mental Status:  alert and oriented to person, place, and time.      Cranial Nerves: No cranial nerve deficit.      Motor: No abnormal muscle tone.      Coordination: Coordination normal.   Psychiatric:         Behavior: Behavior normal.         Thought Content: Thought content normal.         Judgment: Judgment normal.     Labs: Results:   Chemistry Recent Labs     05/12/20  0130 05/11/20  0425 05/10/20  0142   GLU 131* 130* 100*   NA 141 142 141   K 3.9 3.7 3.7   CL 110 112* 109   CO2 26 23 26    BUN 16 15 16    CREA 0.71 0.69 0.76   CA 8.8 8.7 7.9*   AGAP 5 7 6    BUCR 23* 22* 21*      CBC w/Diff Recent Labs     05/12/20  0130 05/11/20  0425 05/10/20  0142    WBC 5.2 3.0* 3.2*   RBC 3.98* 4.27 4.02*   HGB 11.5* 12.3 11.9*   HCT 35.8 38.6 36.3   PLT 274 219 136   GRANS 89* 81* 68   LYMPH 11* 14* 31   EOS 0 0 0      Microbiology No results for input(s): CULT in the last 72 hours.       RADIOLOGY:    All available imaging studies/reports in connect care for this admission were reviewed        Disclaimer: Sections of this note are dictated utilizing voice recognition software, which may have resulted in some phonetic based errors in grammar and contents. Even though attempts were made to correct all the mistakes, some may have been missed, and remained in the body of the document. If questions arise, please contact our department.    Dr. 05/14/20, Infectious Disease Specialist  [phone]  May 12, 2020  8:54 AM

## 2020-05-12 NOTE — Progress Notes (Signed)
Pharmacist Consult: Warfarin Management    Assessment/Plan  INR is supratherapeutic: 3.6  Will hold warfarin today.   Recheck INR daily and adjust dose accordingly. Will continue to follow INR trend and patient's clinical progress daily.     Subjective/Objective  Patient on warfarin therapy for Atrial Fibrillation.   Target goal INR of 2 - 3  Patient is has been on warfarin PTA. Patient's home dose is 5 mg MWF, 3.75 mg TTSS.   Patient was here last week for cardiac cath. She was sent home with warfarin + lovenox bridge. Patient INR low at 1.3, compliance vs. Need for increased dose. She has been same dose since ~10/21.     Date 12/26 12/27 12/28        INR 3.5 4 3.6       Warfarin Dose (mg) HOLD HOLD HOLD         Date 12/19 12/20 12/21  12/22 12/23 12/24  12/25   INR 1.3 1.3 1.6 1.7 2.1 2.0 2.3   Warfarin Dose (mg) 5 mg 5 mg 3.75 mg 5mg  3.75 5 3.75       Bleeding/Sensitivity Risk Factors: Hx variable INRs, Recent Trauma or Surgery, and Hypertension  Potentially interacting medications include:   Drugs that may increase INR: doxycycline  Drugs that may decrease INR: None  Other anticoagulants include: None    Thank you,    05/12/2020

## 2020-05-12 NOTE — Progress Notes (Signed)
Physician Progress Note      PATIENT:               Mary [name], Mary [name]  CSN #:                  [id number]  DOB:                       14-Jul-1965  ADMIT DATE:       05/03/2020 9:25 AM  DISCH DATE:  RESPONDING  PROVIDER #:        Einar Pheasant MD          QUERY TEXT:    Pt admitted with COVID-19 and noted to have *** (SIRS, other signs of sepsis).  If possible, please document in progress notes and discharge summary if you are evaluating and/or treating:    The medical record reflects the following:  Risk Factors: 54 year old female with history of bipolar disorder, asthma, COPD, aortic valve replacement CHF presenting to the emergency department for evaluation of fever and shortness of breath - COVID (+)  Clinical Indicators: 12/26 Temp 101.3, HR 95, WBC 3.2  Per 12/27 ID: Worsening hypoxia, worsening CRP-high risk for clinical deterioration.  Hence will escalate care.  Treatment: CXR 12/26 Mild interstitial edema with small effusions and bibasilar atelectasis, increased from the comparison exam. currently on high flow 9L. Infectious disease following. Increase Decadron to 10 mg IV every 12 hours.  Add baricitinib, Continue doxycycline (d6/7), Robitussin-DM.    Thank you, Charlynn Grimes, RN, CDI  [phone]  Options provided:  -- Sepsis present on admission due to COVID-19 pneumonia  -- Sepsis not present on admission due to COVID-19 pneumonia  -- Covid-19 pneumonia without sepsis  -- Other - I will add my own diagnosis  -- Disagree - Not applicable / Not valid  -- Disagree - Clinically unable to determine / Unknown  -- Refer to Clinical Documentation Reviewer    PROVIDER RESPONSE TEXT:    This patient has Covid-19 pneumonia without sepsis.    Query created by: Charlynn Grimes on 05/12/2020 12:16 PM      Electronically signed by:  Einar Pheasant MD 05/12/2020 12:32 PM

## 2020-05-12 NOTE — Progress Notes (Signed)
Progress  Notes by Rayburn Felt, MD at 05/12/20 2324                Author: Rayburn Felt, MD  Service: FAMILY MEDICINE  Author Type: Resident       Filed: 05/12/20 2348  Date of Service: 05/12/20 2324  Status: Attested           Editor: Rayburn Felt, MD (Resident)  Cosigner: Lurlean Leyden, MD at 05/13/20 1142          Attestation signed by Lurlean Leyden, MD at 05/13/20 1142 (Updated)          Reviewed.   50l/min O2                                      [facility]   PM CHECK Progress Note   SUBJECTIVE   Mary [name] seen at bedside, sitting up and watching TV. On 50L HFNC.      Patient states earlier this evening she felt her SOB worsen but it is now improving on the HFNC. She also feels wheezy, and is using her inhalers PRN.      Mary [name] denies headaches, fevers/chills, CP, abdominal pain, N/V, dysuria/hesitancy, and diarrhea/constipation.       OBJECTIVE   Visit Vitals      BP  123/72 (BP 1 Location: Right upper arm, BP Patient Position: At rest)        Pulse  78     Temp  98.2 ??F (36.8 ??C)     Resp  21     Ht  5\' 3"  (1.6 m)     Wt  88.4 kg (194 lb 12.8 oz)     SpO2  99%     Breastfeeding  No        BMI  34.51 kg/m??           No results found for this or any previous visit (from the past 12 hour(s)).      Physical examination   Consitutional: NAD, AAO   Cardiovascular: RRR, no m/g/r   Respiratory: diffuse expiratory wheezes b/l; good respiratory effort on HFNC   Abdominal: Abdomen soft, NT/ND   Extremities: No edema, no cyanosis      ASSESSMENT/PLAN   VSS on HFNC.      No change in management at this time, continue HFNC 50L with weaning as tolerated. Patient advised to use her albuterol as needed for wheezing.        , MD, PGY-2     [facility]     Senior Pager: [phone]       May 12, 2020, 11:24 PM

## 2020-05-12 NOTE — Progress Notes (Signed)
Progress Notes by Edgar Frisk, MD at 05/12/20 1724                Author: Edgar Frisk, MD  Service: FAMILY MEDICINE  Author Type: Resident       Filed: 05/12/20 1732  Date of Service: 05/12/20 1724  Status: Attested           Editor: Edgar Frisk, MD (Resident)  Cosigner: Lurlean Leyden, MD at 05/13/20 1123          Attestation signed by Lurlean Leyden, MD at 05/13/20 1123          Reviewed.                                 Answered a page from pt's RN, who reported O2 sats 86% on 9L HFNC.  RN intervention included bronchodilator and HFNC increase to 15L.  On arrival, pt was satting  in low 90s with normal respiratory effort.  Lungs had diffuse crackles and mild wheeze over right upper lobe. RT assessed pt as well. Considering, barely stable O2 sats on 15L, there was shared concern for increased O2 requirement beyond what is available  in the room.  CXR showed worsening interstitial edema and pleural effusions bilaterally.  Orders for stepdown placed.      Edgar Frisk, MD   [facility]

## 2020-05-12 NOTE — Progress Notes (Signed)
Oxygen saturation 88% - 90 % on 9 liters of oxygen     oxygen increased to 10 Liter via N/C oxygen saturation 91%-92%

## 2020-05-12 NOTE — Progress Notes (Signed)
02 sat 98%, high flow/salter titrated down to 9L, patient in no distress.

## 2020-05-12 NOTE — Progress Notes (Signed)
Progress Notes by Einar Pheasant, MD at 05/12/20 [phone]                Author: Einar Pheasant, MD  Service: FAMILY MEDICINE  Author Type: Resident       Filed: 05/12/20 0824  Date of Service: 05/12/20 0758  Status: Attested           Editor: Einar Pheasant, MD (Resident)  Cosigner: Lurlean Leyden, MD at 05/12/20 1249          Attestation signed by Lurlean Leyden, MD at 05/12/20 1249                  See resident's note for details. above.  I have personally seen and evaluated this patient.  I have discussed this patient with resident and team.  I have reviewed resident note and  agree with above.  Appreciate ID with antiviral, and decadron.                                             [facility]   Intern Progress Note         Patient: Mary [name]  MRN: [id number]        SSN: [id number]   Date of Birth: [birth year]        Age: 54 y.o.   Sex: female         Admit Date: 05/03/2020     LOS: 9 days      Chief Complaint       Patient presents with        ?  Shortness of Breath     ?  Chest Pain     ?  Cough        ?  Fever             Subjective:     No acute overnight events as per night team    Patient remained afebrile overnight       Patient seen at bedside this morning and reported no complaints or concerns.       ROS :    Denies chest pain, shortness of breath, racing heart, abdominal pain, changes in urinary and bowel habits.        Objective:        Visit Vitals      BP  124/70 (BP 1 Location: Right upper arm, BP Patient Position: Sitting)     Pulse  94     Temp  98.2 ??F (36.8 ??C)     Resp  20     Ht  5\' 3"  (1.6 m)     Wt  88.4 kg (194 lb 12.8 oz)     SpO2  98%     Breastfeeding  No        BMI  34.51 kg/m??           Physical Exam:    General:?? NAD, AOx3, NC in place.??   CV: ??RRR, no M/G/R   RESP:??Unlabored breathing.??CTAB. Equal expansion bilaterally.    ABD:????Soft, nontender, nondistended.    Ext:????No edema, erythema, or tenderness.??   Skin: Warm &??dry. No rashes, lesions, or ulcers. ??Good  turgor.    Psych:??Appropriate??mood and affect.??      Intake and Output:   Current Shift: No intake/output data recorded.   Last three shifts: 12/26 1901 - 12/28 0700   In:  1660 [P.O.:1560; I.V.:100]   Out: 550 [Urine:550]      Lab/Data Review:     Recent Results (from the past 12 hour(s))     METABOLIC PANEL, BASIC          Collection Time: 05/12/20  1:30 AM         Result  Value  Ref Range            Sodium  141  136 - 145 mmol/L       Potassium  3.9  3.5 - 5.5 mmol/L       Chloride  110  100 - 111 mmol/L       CO2  26  21 - 32 mmol/L       Anion gap  5  3.0 - 18 mmol/L       Glucose  131 (H)  74 - 99 mg/dL       BUN  16  7.0 - 18 MG/DL       Creatinine  1.610.71  0.6 - 1.3 MG/DL       BUN/Creatinine ratio  23 (H)  12 - 20         GFR est AA  >60  >60 ml/min/1.673m2       GFR est non-AA  >60  >60 ml/min/1.5773m2       Calcium  8.8  8.5 - 10.1 MG/DL       CBC WITH AUTOMATED DIFF          Collection Time: 05/12/20  1:30 AM         Result  Value  Ref Range            WBC  5.2  4.6 - 13.2 K/uL       RBC  3.98 (L)  4.20 - 5.30 M/uL       HGB  11.5 (L)  12.0 - 16.0 g/dL       HCT  09.635.8  04.535.0 - 45.0 %       MCV  89.9  78.0 - 100.0 FL       MCH  28.9  24.0 - 34.0 PG       MCHC  32.1  31.0 - 37.0 g/dL       RDW  40.913.5  81.111.6 - 14.5 %       PLATELET  274  135 - 420 K/uL       MPV  10.1  9.2 - 11.8 FL       NRBC  0.0  0 PER 100 WBC       ABSOLUTE NRBC  0.00  0.00 - 0.01 K/uL       NEUTROPHILS  89 (H)  40 - 73 %       LYMPHOCYTES  11 (L)  21 - 52 %       MONOCYTES  0 (L)  3 - 10 %       EOSINOPHILS  0  0 - 5 %       BASOPHILS  0  0 - 2 %       IMMATURE GRANULOCYTES  0  %       ABS. NEUTROPHILS  4.6  1.8 - 8.0 K/UL       ABS. LYMPHOCYTES  0.6 (L)  0.9 - 3.6 K/UL       ABS. MONOCYTES  0.0 (L)  0.05 - 1.2 K/UL       ABS. EOSINOPHILS  0.0  0.0 -  0.4 K/UL       ABS. BASOPHILS  0.0  0.0 - 0.1 K/UL       ABS. IMM. GRANS.  0.0  K/UL       DF  MANUAL          PLATELET COMMENTS  ADEQUATE PLATELETS          RBC COMMENTS  ANISOCYTOSIS   1+              RBC COMMENTS  BURR CELLS   FEW             PROCALCITONIN          Collection Time: 05/12/20  1:30 AM         Result  Value  Ref Range            Procalcitonin  <0.05  ng/mL       C REACTIVE PROTEIN, QT          Collection Time: 05/12/20  1:30 AM         Result  Value  Ref Range            C-Reactive protein  3.4 (H)  0 - 0.3 mg/dL       D DIMER          Collection Time: 05/12/20  1:30 AM         Result  Value  Ref Range            D DIMER  0.47 (H)  <0.46 ug/ml(FEU)       FERRITIN          Collection Time: 05/12/20  1:30 AM         Result  Value  Ref Range            Ferritin  1,924 (H)  8 - 388 NG/ML       PROTHROMBIN TIME + INR          Collection Time: 05/12/20  1:30 AM         Result  Value  Ref Range            Prothrombin time  35.3 (H)  11.5 - 15.2 sec            INR  3.6 (H)  0.8 - 1.2                     Assessment and Plan:        This is a 54 y.o.??female??with PMH 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   ??   1. Acute Hypoxic Respiratory failure 2/2 COVID-19 PNA:    Improving, fevers improved.??Breathing and cough improving. ??Doxycycline started for thought of an atypical pneumonia. CXR 12/26 Mild interstitial edema with small effusions and bibasilar atelectasis, increased from the comparison exam. currently  on high flow 9L.    PLAN: ??   - continue hi-flow - wean as tolerated   - PT/OT/CM   - Infectious disease following, appreciate recommendations    - Continue Doxycycline 12/28   - Trend CBC, BMP, CRP, D-dimer, Ferritin, Procal   - Continue dexamethasone IV 10 mg q12 hrs beginning 12/27 as per ID    - Contact precautions    -  Continue home inhalers at this time     - Quarantine for 10 days after symptom onset/positive test   -Continue baricitnib    ??   2. pAfib currently NSR   - INR 2>2.3>3.5>4   - Pharmacy recommendations reviewed, appreciate assistance   PLAN:    - Hold Metoprolol, restart at outpatient follow up   -Warfarin held 12/27 - INR subtherapeutic of 4   -Pharmacy will recheck INR daily and adjust dose accordingly. Will continue to follow INR trend and patient's clinical progress daily.      3. 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 ??BNP 1579,??negative high-sensitivity troponin x 2   ????????????????????????- Hold Lasix 40 mg, restart at outpatient follow up   ????????????????????????- TTE results reviewed    ??   4. Tobacco dependence/questionable COPD:   Patient with 40-pack-year smoking history. ??Current every day smoker.   ????????????????????????- Continue home inhalers   ????????????????????????- No nebulizer treatments in the setting of COVID-19 infection   ????????????????????????- Nicotine patch if needed, encourage cessation of tobacco use on discharge??and follow-up   ??   5. Bipolar 1 disorder/PTSD:   Written for Lamotrigine 75 mg daily by Psychiatrist but patient states taking 50mg  at 75mg  is too high, causes sedation and confusion   ????????????????????????- Continue Lamotrigine 50mg  daily   ??   6. AKI - resolved   Cr - 0.69>0.76>0.69>0.71   ????????????????????????- Encourage PO intake   ??      Diet  ??Cardiac diet     DVT Prophylaxis  ??Lovenox 90 mg twice daily     GI Prophylaxis  ??Protonix 40 mg daily     Code status  ??Full     Disposition  ??Home     ??      Point of Contact  Mary [name]??   Relationship:??God-Daughter   [phone]     ??     MD, PGY-1     [facility]     Intern Pager: [phone]       May 12, 2020, 7:58 AM

## 2020-05-13 DIAGNOSIS — U071 COVID-19: Secondary | ICD-10-CM

## 2020-05-13 LAB — METABOLIC PANEL, BASIC
Anion gap: 6 mmol/L (ref 3.0–18)
BUN/Creatinine ratio: 27 — ABNORMAL HIGH (ref 12–20)
BUN: 20 MG/DL — ABNORMAL HIGH (ref 7.0–18)
CO2: 24 mmol/L (ref 21–32)
Calcium: 9.3 MG/DL (ref 8.5–10.1)
Chloride: 110 mmol/L (ref 100–111)
Creatinine: 0.75 MG/DL (ref 0.6–1.3)
GFR est AA: >60 mL/min/{1.73_m2} (ref >60)
GFR est non-AA: >60 mL/min/{1.73_m2} (ref >60)
Glucose: 128 mg/dL — ABNORMAL HIGH (ref 74–99)
Potassium: 4.1 mmol/L (ref 3.5–5.5)
Sodium: 140 mmol/L (ref 136–145)

## 2020-05-13 LAB — PROCALCITONIN
Procalcitonin: <0.05 ng/mL
Procalcitonin: <0.05 ng/mL

## 2020-05-13 LAB — CBC WITH AUTOMATED DIFF
ABS. BASOPHILS: 0 10*3/uL (ref 0.0–0.1)
ABS. EOSINOPHILS: 0 10*3/uL (ref 0.0–0.4)
ABS. IMM. GRANS.: 0.1 10*3/uL — ABNORMAL HIGH (ref 0.00–0.04)
ABS. LYMPHOCYTES: 1.7 10*3/uL (ref 0.9–3.6)
ABS. MONOCYTES: 0.4 10*3/uL (ref 0.05–1.2)
ABS. NEUTROPHILS: 8.3 10*3/uL — ABNORMAL HIGH (ref 1.8–8.0)
ABSOLUTE NRBC: 0 10*3/uL (ref 0.00–0.01)
BASOPHILS: 0 % (ref 0–2)
EOSINOPHILS: 0 % (ref 0–5)
HCT: 33.8 % — ABNORMAL LOW (ref 35.0–45.0)
HGB: 10.6 g/dL — ABNORMAL LOW (ref 12.0–16.0)
IMMATURE GRANULOCYTES: 1 % — ABNORMAL HIGH (ref 0.0–0.5)
LYMPHOCYTES: 16 % — ABNORMAL LOW (ref 21–52)
MCH: 28.4 PG (ref 24.0–34.0)
MCHC: 31.4 g/dL (ref 31.0–37.0)
MCV: 90.6 FL (ref 78.0–100.0)
MONOCYTES: 4 % (ref 3–10)
MPV: 9.8 FL (ref 9.2–11.8)
NEUTROPHILS: 79 % — ABNORMAL HIGH (ref 40–73)
NRBC: 0 PER 100 WBC
PLATELET: 318 10*3/uL (ref 135–420)
RBC: 3.73 M/uL — ABNORMAL LOW (ref 4.20–5.30)
RDW: 13.7 % (ref 11.6–14.5)
WBC: 10.5 10*3/uL (ref 4.6–13.2)

## 2020-05-13 LAB — FERRITIN
Ferritin: 1467 NG/ML — ABNORMAL HIGH (ref 8–388)
Ferritin: 1467 NG/ML — ABNORMAL HIGH (ref 8–388)

## 2020-05-13 LAB — URINALYSIS W/ RFLX MICROSCOPIC
Bilirubin, Urine: NEGATIVE
Bilirubin: NEGATIVE
Blood, Urine: NEGATIVE
Blood: NEGATIVE
Glucose, Ur: NEGATIVE mg/dL
Glucose: NEGATIVE mg/dL
Ketone: NEGATIVE mg/dL
Ketones, Urine: NEGATIVE mg/dL
Leukocyte Esterase, Urine: NEGATIVE
Leukocyte Esterase: NEGATIVE
Nitrite, Urine: NEGATIVE
Nitrites: NEGATIVE
Specific Gravity, UA: 1.025 (ref 1.005–1.030)
Specific gravity: 1.025 (ref 1.005–1.030)
Urobilinogen, UA, POCT: 0.2 EU/dL (ref 0.2–1.0)
Urobilinogen: 0.2 EU/dL (ref 0.2–1.0)
pH (UA): 6 (ref 5.0–8.0)
pH, UA: 6 (ref 5.0–8.0)

## 2020-05-13 LAB — URINE MICROSCOPIC ONLY
RBC, UA: NEGATIVE /hpf (ref 0–5)
RBC: NEGATIVE /hpf (ref 0–5)
WBC, UA: 0 /hpf (ref 0–4)
WBC: 0 /hpf (ref 0–4)

## 2020-05-13 LAB — PROTHROMBIN TIME + INR
INR: 3.9 — ABNORMAL HIGH (ref 0.8–1.2)
Prothrombin time: 37.8 s — ABNORMAL HIGH (ref 11.5–15.2)

## 2020-05-13 LAB — C REACTIVE PROTEIN, QT: C-Reactive protein: 0.6 mg/dL — ABNORMAL HIGH (ref 0–0.3)

## 2020-05-13 LAB — D DIMER: D DIMER: 0.44 ug/ml(FEU) (ref <0.46)

## 2020-05-13 LAB — CBC WITH AUTO DIFFERENTIAL
Basophils %: 0 % (ref 0–2)
Basophils Absolute: 0 10*3/uL (ref 0.0–0.1)
Eosinophils %: 0 % (ref 0–5)
Eosinophils Absolute: 0 10*3/uL (ref 0.0–0.4)
Granulocyte Absolute Count: 0.1 10*3/uL — ABNORMAL HIGH (ref 0.00–0.04)
Hematocrit: 33.8 % — ABNORMAL LOW (ref 35.0–45.0)
Hemoglobin: 10.6 g/dL — ABNORMAL LOW (ref 12.0–16.0)
Immature Granulocytes: 1 % — ABNORMAL HIGH (ref 0.0–0.5)
Lymphocytes %: 16 % — ABNORMAL LOW (ref 21–52)
Lymphocytes Absolute: 1.7 10*3/uL (ref 0.9–3.6)
MCH: 28.4 PG (ref 24.0–34.0)
MCHC: 31.4 g/dL (ref 31.0–37.0)
MCV: 90.6 FL (ref 78.0–100.0)
MPV: 9.8 FL (ref 9.2–11.8)
Monocytes %: 4 % (ref 3–10)
Monocytes Absolute: 0.4 10*3/uL (ref 0.05–1.2)
NRBC Absolute: 0 10*3/uL (ref 0.00–0.01)
Neutrophils %: 79 % — ABNORMAL HIGH (ref 40–73)
Neutrophils Absolute: 8.3 10*3/uL — ABNORMAL HIGH (ref 1.8–8.0)
Nucleated RBCs: 0 PER 100 WBC
Platelets: 318 10*3/uL (ref 135–420)
RBC: 3.73 M/uL — ABNORMAL LOW (ref 4.20–5.30)
RDW: 13.7 % (ref 11.6–14.5)
WBC: 10.5 10*3/uL (ref 4.6–13.2)

## 2020-05-13 LAB — BASIC METABOLIC PANEL
Anion Gap: 6 mmol/L (ref 3.0–18)
BUN: 20 MG/DL — ABNORMAL HIGH (ref 7.0–18)
Bun/Cre Ratio: 27 — ABNORMAL HIGH (ref 12–20)
CO2: 24 mmol/L (ref 21–32)
Calcium: 9.3 MG/DL (ref 8.5–10.1)
Chloride: 110 mmol/L (ref 100–111)
Creatinine: 0.75 MG/DL (ref 0.6–1.3)
EGFR IF NonAfrican American: >60 mL/min/{1.73_m2} (ref >60)
GFR African American: >60 mL/min/{1.73_m2} (ref >60)
Glucose: 128 mg/dL — ABNORMAL HIGH (ref 74–99)
Potassium: 4.1 mmol/L (ref 3.5–5.5)
Sodium: 140 mmol/L (ref 136–145)

## 2020-05-13 LAB — C-REACTIVE PROTEIN: CRP: 0.6 mg/dL — ABNORMAL HIGH (ref 0–0.3)

## 2020-05-13 LAB — D-DIMER, QUANTITATIVE: D-Dimer, Quant: 0.44 ug/ml(FEU) (ref <0.46)

## 2020-05-13 LAB — PROTIME-INR
INR: 3.9 — ABNORMAL HIGH (ref 0.8–1.2)
Protime: 37.8 s — ABNORMAL HIGH (ref 11.5–15.2)

## 2020-05-13 MED FILL — DEXTROMETHORPHAN-GUAIFENESIN 10 MG-100 MG/5 ML SYRUP: 100-10 mg/5 mL | ORAL | Qty: 10

## 2020-05-13 MED FILL — ACETAMINOPHEN 500 MG TAB: 500 mg | ORAL | Qty: 2

## 2020-05-13 MED FILL — CHOLECALCIFEROL (VITAMIN D3) 1,000 UNIT (25 MCG) TAB: ORAL | Qty: 2

## 2020-05-13 MED FILL — PRAVASTATIN 20 MG TAB: 20 mg | ORAL | Qty: 2

## 2020-05-13 MED FILL — IBUPROFEN 200 MG TAB: 200 mg | ORAL | Qty: 1

## 2020-05-13 MED FILL — PANTOPRAZOLE 40 MG TAB, DELAYED RELEASE: 40 mg | ORAL | Qty: 1

## 2020-05-13 MED FILL — DEXAMETHASONE SODIUM PHOSPHATE 4 MG/ML IJ SOLN: 4 mg/mL | INTRAMUSCULAR | Qty: 3

## 2020-05-13 MED FILL — LYRICA 25 MG CAPSULE: 25 mg | ORAL | Qty: 2

## 2020-05-13 MED FILL — PREGABALIN 50 MG CAP: 50 mg | ORAL | Qty: 1

## 2020-05-13 MED FILL — CETIRIZINE 10 MG TAB: 10 mg | ORAL | Qty: 1

## 2020-05-13 MED FILL — DOXYCYCLINE HYCLATE 100 MG IV SOLUTION: 100 mg | INTRAVENOUS | Qty: 100

## 2020-05-13 MED FILL — OLUMIANT 2 MG TABLET: 2 mg | ORAL | Qty: 2

## 2020-05-13 MED FILL — IBUPROFEN 600 MG TAB: 600 mg | ORAL | Qty: 1

## 2020-05-13 NOTE — Progress Notes (Signed)
Problem: Falls - Risk of  Goal: *Absence of Falls  Description: Document Mary [name] Fall Risk and appropriate interventions in the flowsheet.  Outcome: Progressing Towards Goal  Note: Fall Risk Interventions:  Mobility Interventions: Bed/chair exit alarm,Communicate number of staff needed for ambulation/transfer         Medication Interventions: Bed/chair exit alarm,Evaluate medications/consider consulting pharmacy,Patient to call before getting OOB    Elimination Interventions: Call light in reach,Bed/chair exit alarm              Problem: Patient Education: Go to Patient Education Activity  Goal: Patient/Family Education  Outcome: Progressing Towards Goal     Problem: Pain  Goal: *Control of Pain  Outcome: Progressing Towards Goal  Goal: *PALLIATIVE CARE:  Alleviation of Pain  Outcome: Progressing Towards Goal     Problem: Patient Education: Go to Patient Education Activity  Goal: Patient/Family Education  Outcome: Progressing Towards Goal     Problem: Activity Intolerance  Goal: *Oxygen saturation during activity within specified parameters  Outcome: Progressing Towards Goal  Goal: *Able to remain out of bed as prescribed  Outcome: Progressing Towards Goal     Problem: Patient Education: Go to Patient Education Activity  Goal: Patient/Family Education  Outcome: Progressing Towards Goal

## 2020-05-13 NOTE — Progress Notes (Signed)
Weaning oxygen     05/13/20 1558   Oxygen Therapy   O2 Sat (%) 95 %   Pulse via Oximetry 77 beats per minute   O2 Device Heated;Hi flow nasal cannula   O2 Flow Rate (L/min) 40 l/min   FIO2 (%) 65 %
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
## 2020-05-13 NOTE — Progress Notes (Signed)
Infectious Disease progress Note        Reason: COVID-19 pneumonia    Current abx Prior abx   Doxycycline since 05/06/20-12/29      Lines:       Assessment :  54 y.o. 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    Malaise for about one week  S/p cardiac cath about a week ago    Clinical presentation consistent with acute hypoxia-present on admission due to COVID-19 pneumonia       Persistent hypoxia likely due to covid-19 infection superimposed on underlying emphysema    Increasing oxygen requirement-likely due to progressive COVID-19 pneumonia- currently on 30 L high flow oxygen    Worsening hypoxia, worsening CRP on 05/11/2020-high risk for clinical deterioration.  Hence switched to high dose decadron, baricitinib    Low procalcitonin argues against superimposed bacterial infection.      Improved inflammatory markers.    Switched to higher oxygen overnight due to worsening hypoxia- likely due to covid 19 pneumonia      Recommendations:    1.  Continue Decadron 10 mg IV every 12 hours till 12/31 followed by 10 mg IV q 24 hour till 05/21/19.  Continue baricitinib  2.   continue Robitussin-DM.   3. Continue diuretics per primary team  4.  Continue anticoagulation per primary team/pulmonary  5.  Monitor CRP, D-dimer, procalcitonin  6.  Titrate oxygen as tolerated  7. F/u pulmonary recommendations       Above plan was discussed in details with patient,RN and primary team. Please call me if any further questions or concerns. Will continue to participate in the care of this patient.    HPI:  States that she feels better.  Improved cough   Denies pleuritic chest pain, abdominal pain, diarrhea, nausea.    Past Medical History:   Diagnosis Date   ??? Anxiety    ??? Asthma    ??? Bipolar 1  disorder (HCC)    ??? CHF (congestive heart failure) (HCC)    ??? COPD (chronic obstructive pulmonary disease) (HCC)    ??? H/O aortic valve replacement    ??? PTSD (post-traumatic stress disorder)        Past Surgical History:   Procedure Laterality Date   ??? HX HEART CATHETERIZATION     ??? HX HEART VALVE SURGERY         home Medication List    Details   hydrOXYzine HCL (ATARAX) 25 mg tablet Take  by mouth nightly as needed.      enoxaparin (Lovenox) 40 mg/0.4 mL 60 mg by SubCUTAneous route every twelve (12) hours every twelve (12) hours.      cyclobenzaprine (FLEXERIL) 5 mg tablet Take 5 mg by mouth nightly as needed for Muscle Spasm(s).      acetaminophen (Tylenol Extra Strength) 500 mg tablet Take 1,000 mg by mouth every eight (8) hours as needed for Pain.      pregabalin (LYRICA) 200 mg capsule take 1 capsule by mouth three times a day  Qty: 90 Capsule, Refills: 1    Associated Diagnoses: Neck pain; Chronic pain syndrome      albuterol (PROVENTIL HFA, VENTOLIN HFA, PROAIR HFA) 90 mcg/actuation inhaler Take 2 Puffs by inhalation every six (6) hours as needed.      cetirizine (ZYRTEC) 10 mg tablet  Take 10 mg by mouth daily.      fluticasone propionate (FLONASE) 50 mcg/actuation nasal spray 1 Spray by Nasal route daily.      furosemide (LASIX) 40 mg tablet TAKE ONE TABLET BY MOUTH EVERY DAY      Combivent Respimat 20-100 mcg/actuation inhaler INHALE 1 PUFF BY MOUTH INTO LUNGS EVERY 6 HOURS      lamoTRIgine (LaMICtal) 25 mg tablet TAKE 3 TABLETS BY MOUTH ONCE DAILY      metoprolol succinate (TOPROL-XL) 25 mg XL tablet       omeprazole (PRILOSEC) 40 mg capsule Take 40 mg by mouth daily.      pravastatin (PRAVACHOL) 40 mg tablet Take 40 mg by mouth nightly.        warfarin (COUMADIN) 2.5 mg tablet TAKE 2 TABLETS BY MOUTH ONCE DAILY ON MONDAY WEDNESDAY FRIDAY AND TAKE 1 & 1 2 (ONE AND ONE HALF) ALL OTHER DAYS             Current Facility-Administered Medications   Medication Dose Route Frequency   ??? lidocaine 4 % patch 1 Patch   1 Patch TransDERmal DAILY PRN   ??? dexamethasone (DECADRON) 4 mg/mL injection 10 mg  10 mg IntraVENous Q12H   ??? baricitinib (OLUMIANT) tablet 4 mg  4 mg Oral DAILY   ??? ibuprofen (MOTRIN) tablet 600 mg  600 mg Oral Q6H   ??? guaiFENesin-dextromethorphan (ROBITUSSIN DM) 100-10 mg/5 mL syrup 5 mL  5 mL Oral Q6H   ??? lamoTRIgine (LaMICtal) tablet 50 mg  50 mg Oral DAILY   ??? acetaminophen (TYLENOL) tablet 1,000 mg  1,000 mg Oral Q8H   ??? sodium chloride (NS) flush 5-40 mL  5-40 mL IntraVENous Q8H   ??? sodium chloride (NS) flush 5-40 mL  5-40 mL IntraVENous PRN   ??? ondansetron (ZOFRAN ODT) tablet 4 mg  4 mg Oral Q8H PRN    Or   ??? ondansetron (ZOFRAN) injection 4 mg  4 mg IntraVENous Q6H PRN   ??? cholecalciferol (VITAMIN D3) (1000 Units /25 mcg) tablet 2,000 Units  2,000 Units Oral DAILY   ??? cetirizine (ZYRTEC) tablet 10 mg  10 mg Oral DAILY   ??? fluticasone propionate (FLONASE) 50 mcg/actuation nasal spray 2 Spray  2 Spray Both Nostrils DAILY   ??? [Held by provider] furosemide (LASIX) tablet 40 mg  40 mg Oral DAILY   ??? hydrOXYzine HCL (ATARAX) tablet 25 mg  25 mg Oral TID PRN   ??? pantoprazole (PROTONIX) tablet 40 mg  40 mg Oral DAILY   ??? pravastatin (PRAVACHOL) tablet 40 mg  40 mg Oral QHS   ??? pregabalin (LYRICA) capsule 200 mg  200 mg Oral TID   ??? [Held by provider] metoprolol succinate (TOPROL-XL) XL tablet 25 mg  25 mg Oral DAILY   ??? albuterol (PROVENTIL HFA, VENTOLIN HFA, PROAIR HFA) inhaler 2 Puff  2 Puff Inhalation Q6H PRN   ??? ipratropium-albuterol (COMBIVENT RESPIMAT) 20 mcg-100 mcg inhalation spray  1 Puff Inhalation Q6H RT   ??? Warfarin - Pharmacy to Dose   Other Rx Dosing/Monitoring       Allergies: Adhesive and Lithium    Family History   Problem Relation Age of Onset   ??? No Known Problems Mother      Social History     Socioeconomic History   ??? Marital status: DIVORCED     Spouse name: Not on file   ??? Number of children: Not on file   ??? Years of education: Not  on file   ??? Highest education level: Not on file    Occupational History   ??? Not on file   Tobacco Use   ??? Smoking status: Current Every Day Smoker     Packs/day: 1.00   ??? Smokeless tobacco: Never Used   Substance and Sexual Activity   ??? Alcohol use: Never   ??? Drug use: Yes     Types: Marijuana   ??? Sexual activity: Not on file   Other Topics Concern   ??? Not on file   Social History Narrative   ??? Not on file     Social Determinants of Health     Financial Resource Strain:    ??? Difficulty of Paying Living Expenses: Not on file   Food Insecurity:    ??? Worried About Running Out of Food in the Last Year: Not on file   ??? Ran Out of Food in the Last Year: Not on file   Transportation Needs:    ??? Lack of Transportation (Medical): Not on file   ??? Lack of Transportation (Non-Medical): Not on file   Physical Activity:    ??? Days of Exercise per Week: Not on file   ??? Minutes of Exercise per Session: Not on file   Stress:    ??? Feeling of Stress : Not on file   Social Connections:    ??? Frequency of Communication with Friends and Family: Not on file   ??? Frequency of Social Gatherings with Friends and Family: Not on file   ??? Attends Religious Services: Not on file   ??? Active Member of Clubs or Organizations: Not on file   ??? Attends Banker Meetings: Not on file   ??? Marital Status: Not on file   Intimate Partner Violence:    ??? Fear of Current or Ex-Partner: Not on file   ??? Emotionally Abused: Not on file   ??? Physically Abused: Not on file   ??? Sexually Abused: Not on file   Housing Stability:    ??? Unable to Pay for Housing in the Last Year: Not on file   ??? Number of Places Lived in the Last Year: Not on file   ??? Unstable Housing in the Last Year: Not on file     Social History     Tobacco Use   Smoking Status Current Every Day Smoker   ??? Packs/day: 1.00   Smokeless Tobacco Never Used        Temp (24hrs), Avg:98.5 ??F (36.9 ??C), Min:98.2 ??F (36.8 ??C), Max:99.2 ??F (37.3 ??C)    Visit Vitals  BP 109/69 (BP 1 Location: Right upper arm, BP Patient Position: At rest)   Pulse 65    Temp 98.5 ??F (36.9 ??C)   Resp 20   Ht 5\' 3"  (1.6 m)   Wt 88.4 kg (194 lb 12.8 oz)   SpO2 99%   Breastfeeding No   BMI 34.51 kg/m??       ROS: 12 point ROS obtained in details. Pertinent positives as mentioned in HPI,   otherwise negative    Physical Exam:    Vitals signs and nursing note reviewed.   Constitutional:    Sitting on bed.  Alert awake oriented x3, appears more comfortable than prior exam  HENT:      Head: Normocephalic.   Eyes:      Conjunctiva/sclera: Conjunctivae normal.      Neck:      Musculoskeletal: Normal range of motion and neck  supple.   Cardiovascular:      Rate and Rhythm: Normal rate and regular rhythm on monitor  Chest:      Bilateral chest movements equal.  Auscultation deferred due to Covid positive  Abdominal:      General: There is no distension.      Palpations: Abdomen is soft.      Tenderness: There is no abdominal tenderness. There is no rebound.   Musculoskeletal: Normal range of motion.         General: No tenderness.   Skin:     General: Skin is warm and dry.      Findings: No rash.   Neurological:      Mental Status:  alert and oriented to person, place, and time.      Cranial Nerves: No cranial nerve deficit.      Motor: No abnormal muscle tone.      Coordination: Coordination normal.   Psychiatric:         Behavior: Behavior normal.         Thought Content: Thought content normal.         Judgment: Judgment normal.     Labs: Results:   Chemistry Recent Labs     05/13/20  0449 05/12/20  0130 05/11/20  0425   GLU 128* 131* 130*   NA 140 141 142   K 4.1 3.9 3.7   CL 110 110 112*   CO2 24 26 23    BUN 20* 16 15   CREA 0.75 0.71 0.69   CA 9.3 8.8 8.7   AGAP 6 5 7    BUCR 27* 23* 22*      CBC w/Diff Recent Labs     05/13/20  0449 05/12/20  0130 05/11/20  0425   WBC 10.5 5.2 3.0*   RBC 3.73* 3.98* 4.27   HGB 10.6* 11.5* 12.3   HCT 33.8* 35.8 38.6   PLT 318 274 219   GRANS 79* 89* 81*   LYMPH 16* 11* 14*   EOS 0 0 0      Microbiology No results for input(s): CULT in the last 72 hours.        RADIOLOGY:    All available imaging studies/reports in connect care for this admission were reviewed    High complexity decision making was performed during the evaluation of this patient at high risk for decompensation        Disclaimer: Sections of this note are dictated utilizing voice recognition software, which may have resulted in some phonetic based errors in grammar and contents. Even though attempts were made to correct all the mistakes, some may have been missed, and remained in the body of the document. If questions arise, please contact our department.    Dr. 05/14/20, Infectious Disease Specialist  [phone]  May 13, 2020  8:54 AM

## 2020-05-13 NOTE — Progress Notes (Signed)
Progress Notes by Einar Pheasant, MD at 05/13/20 [phone]                Author: Einar Pheasant, MD  Service: FAMILY MEDICINE  Author Type: Resident       Filed: 05/13/20 0830  Date of Service: 05/13/20 0819  Status: Attested           Editor: Einar Pheasant, MD (Resident)  Cosigner: Lurlean Leyden, MD at 05/13/20 1143          Attestation signed by Lurlean Leyden, MD at 05/13/20 1143                  See resident's note for details. above.  I have personally seen and evaluated this patient.  I have discussed this patient with resident and team.  I have reviewed resident note and  agree with above.  Complaints of volume of O2.  Lowered to 25 liter min.                                              [facility] Family [facility]   Intern Progress Note         Patient: Mary [name]  MRN: [id number]        SSN: [id number]   Date of Birth: 1966-04-09        Age: 54 y.o.   Sex: female         Admit Date: 05/03/2020     LOS: 10 days      Chief Complaint       Patient presents with        ?  Shortness of Breath     ?  Chest Pain     ?  Cough        ?  Fever             Subjective:     No overnight events as per night team    Patient was seen at bedside this morning and was very irritated. She states "no one has come to change the sheets or given me anything to drink"       ROS :    Denies chest pain , shortness of breath, abdominal pain,         Objective:        Visit Vitals      BP  109/69 (BP 1 Location: Right upper arm, BP Patient Position: At rest)     Pulse  65     Temp  98.5 ??F (36.9 ??C)     Resp  20     Ht  5\' 3"  (1.6 m)     Wt  88.4 kg (194 lb 12.8 oz)     SpO2  99%     Breastfeeding  No        BMI  34.51 kg/m??              Physical Exam:    General:?? NAD, AOx3, NC in place.??   CV: ??RRR, no M/G/R   RESP:??Unlabored breathing.??CTAB. Equal expansion bilaterally.    ABD:????Soft, nontender, nondistended.    Ext:????No edema, erythema, or tenderness.??   Skin: Warm &??dry. No rashes, lesions, or ulcers. ??Good turgor.     Psych:??Appropriate??mood and affect.??   Intake and Output:   Current Shift: No intake/output data recorded.   Last three shifts: 12/27 1901 -  12/29 0700   In: 1160 [P.O.:960; I.V.:200]   Out: 1100 [Urine:1100]      Lab/Data Review:     Recent Results (from the past 12 hour(s))     METABOLIC PANEL, BASIC          Collection Time: 05/13/20  4:49 AM         Result  Value  Ref Range            Sodium  140  136 - 145 mmol/L       Potassium  4.1  3.5 - 5.5 mmol/L       Chloride  110  100 - 111 mmol/L       CO2  24  21 - 32 mmol/L       Anion gap  6  3.0 - 18 mmol/L       Glucose  128 (H)  74 - 99 mg/dL       BUN  20 (H)  7.0 - 18 MG/DL       Creatinine  6.76  0.6 - 1.3 MG/DL       BUN/Creatinine ratio  27 (H)  12 - 20         GFR est AA  >60  >60 ml/min/1.9m2       GFR est non-AA  >60  >60 ml/min/1.81m2       Calcium  9.3  8.5 - 10.1 MG/DL       CBC WITH AUTOMATED DIFF          Collection Time: 05/13/20  4:49 AM         Result  Value  Ref Range            WBC  10.5  4.6 - 13.2 K/uL       RBC  3.73 (L)  4.20 - 5.30 M/uL       HGB  10.6 (L)  12.0 - 16.0 g/dL       HCT  19.5 (L)  09.3 - 45.0 %       MCV  90.6  78.0 - 100.0 FL       MCH  28.4  24.0 - 34.0 PG       MCHC  31.4  31.0 - 37.0 g/dL       RDW  26.7  12.4 - 14.5 %       PLATELET  318  135 - 420 K/uL       MPV  9.8  9.2 - 11.8 FL       NRBC  0.0  0 PER 100 WBC       ABSOLUTE NRBC  0.00  0.00 - 0.01 K/uL       NEUTROPHILS  79 (H)  40 - 73 %       LYMPHOCYTES  16 (L)  21 - 52 %       MONOCYTES  4  3 - 10 %       EOSINOPHILS  0  0 - 5 %       BASOPHILS  0  0 - 2 %       IMMATURE GRANULOCYTES  1 (H)  0.0 - 0.5 %       ABS. NEUTROPHILS  8.3 (H)  1.8 - 8.0 K/UL       ABS. LYMPHOCYTES  1.7  0.9 - 3.6 K/UL       ABS. MONOCYTES  0.4  0.05 - 1.2 K/UL  ABS. EOSINOPHILS  0.0  0.0 - 0.4 K/UL       ABS. BASOPHILS  0.0  0.0 - 0.1 K/UL       ABS. IMM. GRANS.  0.1 (H)  0.00 - 0.04 K/UL       DF  AUTOMATED          PROCALCITONIN          Collection Time: 05/13/20  4:49 AM          Result  Value  Ref Range            Procalcitonin  <0.05  ng/mL       C REACTIVE PROTEIN, QT          Collection Time: 05/13/20  4:49 AM         Result  Value  Ref Range            C-Reactive protein  0.6 (H)  0 - 0.3 mg/dL       D DIMER          Collection Time: 05/13/20  4:49 AM         Result  Value  Ref Range            D DIMER  0.44  <0.46 ug/ml(FEU)       FERRITIN          Collection Time: 05/13/20  4:49 AM         Result  Value  Ref Range            Ferritin  1,467 (H)  8 - 388 NG/ML       PROTHROMBIN TIME + INR          Collection Time: 05/13/20  4:49 AM         Result  Value  Ref Range            Prothrombin time  37.8 (H)  11.5 - 15.2 sec       INR  3.9 (H)  0.8 - 1.2         URINALYSIS W/ RFLX MICROSCOPIC          Collection Time: 05/13/20  5:00 AM         Result  Value  Ref Range            Color  YELLOW          Appearance  CLEAR          Specific gravity  1.025  1.005 - 1.030         pH (UA)  6.0  5.0 - 8.0         Protein  TRACE (A)  NEG mg/dL       Glucose  Negative  NEG mg/dL       Ketone  Negative  NEG mg/dL       Bilirubin  Negative  NEG         Blood  Negative  NEG         Urobilinogen  0.2  0.2 - 1.0 EU/dL       Nitrites  Negative  NEG         Leukocyte Esterase  Negative  NEG         URINE MICROSCOPIC ONLY          Collection Time: 05/13/20  5:00 AM         Result  Value  Ref Range  WBC  0 to 2  0 - 4 /hpf       RBC  Negative  0 - 5 /hpf       Epithelial cells  FEW  0 - 5 /lpf            Bacteria  FEW (A)  NEG /hpf                   Assessment and Plan:     This is a 54 y.o.??female??with PMH bioprosthetic mitral valve replacement/Maze procedure/LAA appendage ligation in 2017, Afib(on warfarin bridged  with Lovenox), HTN, Smoker,??Bipolar 1 disorder/PTSD,??stable angina pectoris(s/p LHC)??now admitted with??acute hypoxic respiratory failure in the setting of COVID-19.??Vasovagal event??on 12/23??during BM which delayed discharge, but??her  VS are improved and have been stable.????   ??   1. Acute  Hypoxic Respiratory failure 2/2 COVID-19 PNA:    Improving, fevers improved.??Breathing and cough improving. ??Doxycycline started for thought of an atypical pneumonia. CXR??12/26??Mild interstitial edema with small effusions and bibasilar atelectasis, increased from the comparison exam. Currently  on 50 L hi-flow   PLAN:????   -??continue hi-flow - wean as tolerated   - PT/OT/CM   - Infectious disease following, appreciate recommendations    - Continue Doxycycline??12/28   - Trend CBC, BMP, CRP, D-dimer, Ferritin, Procal   - Continue dexamethasone IV 10 mg q12 hrs??beginning 12/27 as per ID??   - Contact precautions    - Continue home inhalers at this time    - Quarantine for 10 days after symptom onset/positive test   -Continue baricitnib    ??   2. pAfib currently NSR   - INR??2>2.3>3.5>4>3.6   - Pharmacy recommendations reviewed, appreciate assistance   PLAN:    - Hold Metoprolol, restart at outpatient follow up   -Warfarin held 12/27 - INR subtherapeutic of 4   -Pharmacy will recheck INR daily and adjust dose accordingly. Will continue to follow INR trend and patient's clinical progress daily.   ??   3. 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 ??BNP 1579,??negative high-sensitivity troponin x 2   ????????????????????????- Hold Lasix 40 mg, restart at outpatient follow up   ????????????????????????- TTE results reviewed    ??   4. Tobacco dependence/questionable COPD:   Patient with 40-pack-year smoking history. ??Current every day smoker.   ????????????????????????- Continue home inhalers   ????????????????????????- No nebulizer treatments in the setting of COVID-19 infection   ????????????????????????- Nicotine patch if needed, encourage cessation of tobacco use on discharge??and follow-up   ??   5. Bipolar 1 disorder/PTSD:   Written for Lamotrigine 75 mg daily by Psychiatrist but patient states taking 50mg  at 75mg  is too high, causes  sedation and confusion   ????????????????????????- Continue Lamotrigine 50mg  daily   ??   6. AKI - resolved   Cr - 0.69>0.76>0.69>0.71>0.75   ????????????????????????- Encourage PO intake   ??      Diet  ??Cardiac diet     DVT Prophylaxis  ??Lovenox 90 mg twice daily     GI Prophylaxis  ??Protonix 40 mg daily     Code status  ??Full     Disposition  ??Home     ??      Point of Contact  Jerrica Bulluck??   Relationship:??God-Daughter   [phone])??             MD, PGY-1     [facility]  Family Medicine     Intern Pager: [phone]       May 13, 2020, 8:19 AM

## 2020-05-13 NOTE — Progress Notes (Signed)
Pharmacist Consult: Warfarin Management    Assessment/Plan  INR is supratherapeutic: 3.9  Will hold warfarin today.   Recheck INR daily and adjust dose accordingly. Will continue to follow INR trend and patient's clinical progress daily.     Subjective/Objective  Patient on warfarin therapy for Atrial Fibrillation.   Target goal INR of 2 - 3  Patient is has been on warfarin PTA. Patient's home dose is 5 mg MWF, 3.75 mg TTSS.   Patient was here last week for cardiac cath. She was sent home with warfarin + lovenox bridge. Patient INR low at 1.3, compliance vs. Need for increased dose. She has been same dose since ~10/21.     Date 12/26 12/27 12/28  12/29      INR 3.5 4 3.6 3.9      Warfarin Dose (mg) HOLD HOLD HOLD HOLD        Date 12/19 12/20 12/21  12/22 12/23 12/24  12/25   INR 1.3 1.3 1.6 1.7 2.1 2.0 2.3   Warfarin Dose (mg) 5 mg 5 mg 3.75 mg 5mg  3.75 5 3.75       Bleeding/Sensitivity Risk Factors: Hx variable INRs, Recent Trauma or Surgery, and Hypertension  Potentially interacting medications include:   Drugs that may increase INR: dexamethasone  Drugs that may decrease INR: None  Other anticoagulants include: None    Thank you,  , [facility]  05/13/2020

## 2020-05-14 LAB — METABOLIC PANEL, BASIC
Anion gap: 5 mmol/L (ref 3.0–18)
BUN/Creatinine ratio: 32 — ABNORMAL HIGH (ref 12–20)
BUN: 33 MG/DL — ABNORMAL HIGH (ref 7.0–18)
CO2: 26 mmol/L (ref 21–32)
Calcium: 8.7 MG/DL (ref 8.5–10.1)
Chloride: 110 mmol/L (ref 100–111)
Creatinine: 1.02 MG/DL (ref 0.6–1.3)
GFR est AA: >60 mL/min/{1.73_m2} (ref >60)
GFR est non-AA: 57 mL/min/{1.73_m2} — ABNORMAL LOW (ref >60)
Glucose: 109 mg/dL — ABNORMAL HIGH (ref 74–99)
Potassium: 4.8 mmol/L (ref 3.5–5.5)
Sodium: 141 mmol/L (ref 136–145)

## 2020-05-14 LAB — D DIMER: D DIMER: 0.51 ug/ml(FEU) — ABNORMAL HIGH (ref <0.46)

## 2020-05-14 LAB — CBC WITH AUTOMATED DIFF
ABS. BASOPHILS: 0 10*3/uL (ref 0.0–0.1)
ABS. EOSINOPHILS: 0 10*3/uL (ref 0.0–0.4)
ABS. IMM. GRANS.: 0 10*3/uL
ABS. LYMPHOCYTES: 1.2 10*3/uL (ref 0.9–3.6)
ABS. MONOCYTES: 0.8 10*3/uL (ref 0.05–1.2)
ABS. NEUTROPHILS: 9.3 10*3/uL — ABNORMAL HIGH (ref 1.8–8.0)
ABSOLUTE NRBC: 0 10*3/uL (ref 0.00–0.01)
BAND NEUTROPHILS: 4 % (ref 0–5)
BASOPHILS: 0 % (ref 0–2)
EOSINOPHILS: 0 % (ref 0–5)
HCT: 32.3 % — ABNORMAL LOW (ref 35.0–45.0)
HGB: 10.4 g/dL — ABNORMAL LOW (ref 12.0–16.0)
IMMATURE GRANULOCYTES: 0 %
LYMPHOCYTES: 11 % — ABNORMAL LOW (ref 21–52)
MCH: 29.5 PG (ref 24.0–34.0)
MCHC: 32.2 g/dL (ref 31.0–37.0)
MCV: 91.5 FL (ref 78.0–100.0)
MONOCYTES: 7 % (ref 3–10)
MPV: 9.9 FL (ref 9.2–11.8)
NEUTROPHILS: 78 % — ABNORMAL HIGH (ref 40–73)
NRBC: 0 PER 100 WBC
PLATELET COMMENTS: ADEQUATE
PLATELET: 359 10*3/uL (ref 135–420)
RBC: 3.53 M/uL — ABNORMAL LOW (ref 4.20–5.30)
RDW: 13.3 % (ref 11.6–14.5)
WBC: 11.3 10*3/uL (ref 4.6–13.2)

## 2020-05-14 LAB — FERRITIN
Ferritin: 951 NG/ML — ABNORMAL HIGH (ref 8–388)
Ferritin: 951 NG/ML — ABNORMAL HIGH (ref 8–388)

## 2020-05-14 LAB — PROCALCITONIN
Procalcitonin: <0.05 ng/mL
Procalcitonin: <0.05 ng/mL

## 2020-05-14 LAB — PROTHROMBIN TIME + INR
INR: 3 — ABNORMAL HIGH (ref 0.8–1.2)
Prothrombin time: 31.1 s — ABNORMAL HIGH (ref 11.5–15.2)

## 2020-05-14 LAB — C REACTIVE PROTEIN, QT: C-Reactive protein: <0.3 mg/dL (ref 0–0.3)

## 2020-05-14 LAB — BASIC METABOLIC PANEL
Anion Gap: 5 mmol/L (ref 3.0–18)
BUN: 33 MG/DL — ABNORMAL HIGH (ref 7.0–18)
Bun/Cre Ratio: 32 — ABNORMAL HIGH (ref 12–20)
CO2: 26 mmol/L (ref 21–32)
Calcium: 8.7 MG/DL (ref 8.5–10.1)
Chloride: 110 mmol/L (ref 100–111)
Creatinine: 1.02 MG/DL (ref 0.6–1.3)
EGFR IF NonAfrican American: 57 mL/min/{1.73_m2} — ABNORMAL LOW (ref >60)
GFR African American: >60 mL/min/{1.73_m2} (ref >60)
Glucose: 109 mg/dL — ABNORMAL HIGH (ref 74–99)
Potassium: 4.8 mmol/L (ref 3.5–5.5)
Sodium: 141 mmol/L (ref 136–145)

## 2020-05-14 LAB — D-DIMER, QUANTITATIVE: D-Dimer, Quant: 0.51 ug/ml(FEU) — ABNORMAL HIGH (ref <0.46)

## 2020-05-14 LAB — CBC WITH AUTO DIFFERENTIAL
Band Neutrophils: 4 % (ref 0–5)
Basophils %: 0 % (ref 0–2)
Basophils Absolute: 0 10*3/uL (ref 0.0–0.1)
Eosinophils %: 0 % (ref 0–5)
Eosinophils Absolute: 0 10*3/uL (ref 0.0–0.4)
Granulocyte Absolute Count: 0 10*3/uL
Hematocrit: 32.3 % — ABNORMAL LOW (ref 35.0–45.0)
Hemoglobin: 10.4 g/dL — ABNORMAL LOW (ref 12.0–16.0)
Immature Granulocytes: 0 %
Lymphocytes %: 11 % — ABNORMAL LOW (ref 21–52)
Lymphocytes Absolute: 1.2 10*3/uL (ref 0.9–3.6)
MCH: 29.5 PG (ref 24.0–34.0)
MCHC: 32.2 g/dL (ref 31.0–37.0)
MCV: 91.5 FL (ref 78.0–100.0)
MPV: 9.9 FL (ref 9.2–11.8)
Monocytes %: 7 % (ref 3–10)
Monocytes Absolute: 0.8 10*3/uL (ref 0.05–1.2)
NRBC Absolute: 0 10*3/uL (ref 0.00–0.01)
Neutrophils %: 78 % — ABNORMAL HIGH (ref 40–73)
Neutrophils Absolute: 9.3 10*3/uL — ABNORMAL HIGH (ref 1.8–8.0)
Nucleated RBCs: 0 PER 100 WBC
Platelet Comment: ADEQUATE
Platelets: 359 10*3/uL (ref 135–420)
RBC: 3.53 M/uL — ABNORMAL LOW (ref 4.20–5.30)
RDW: 13.3 % (ref 11.6–14.5)
WBC: 11.3 10*3/uL (ref 4.6–13.2)

## 2020-05-14 LAB — PROTIME-INR
INR: 3 — ABNORMAL HIGH (ref 0.8–1.2)
Protime: 31.1 s — ABNORMAL HIGH (ref 11.5–15.2)

## 2020-05-14 LAB — C-REACTIVE PROTEIN: CRP: <0.3 mg/dL (ref 0–0.3)

## 2020-05-14 MED ORDER — BUDESONIDE 1 MG/2 ML NEB SUSPENSION
1 mg/2 mL | Freq: Two times a day (BID) | RESPIRATORY_TRACT | Status: DC
Start: 2020-05-14 — End: 2020-05-15
  Administered 2020-05-14 – 2020-05-15 (×2): via RESPIRATORY_TRACT

## 2020-05-14 MED ORDER — WARFARIN 2.5 MG TAB
2.5 mg | Freq: Once | ORAL | Status: AC
Start: 2020-05-14 — End: 2020-05-14
  Administered 2020-05-14: via ORAL

## 2020-05-14 MED ORDER — IPRATROPIUM-ALBUTEROL 2.5 MG-0.5 MG/3 ML NEB SOLUTION
2.5 mg-0.5 mg/3 ml | Freq: Four times a day (QID) | RESPIRATORY_TRACT | Status: DC
Start: 2020-05-14 — End: 2020-05-15
  Administered 2020-05-14 – 2020-05-15 (×3): via RESPIRATORY_TRACT

## 2020-05-14 MED FILL — BUDESONIDE 1 MG/2 ML NEB SUSPENSION: 1 mg/2 mL | RESPIRATORY_TRACT | Qty: 1

## 2020-05-14 MED FILL — LYRICA 25 MG CAPSULE: 25 mg | ORAL | Qty: 2

## 2020-05-14 MED FILL — IBUPROFEN 200 MG TAB: 200 mg | ORAL | Qty: 1

## 2020-05-14 MED FILL — ACETAMINOPHEN 500 MG TAB: 500 mg | ORAL | Qty: 2

## 2020-05-14 MED FILL — DEXTROMETHORPHAN-GUAIFENESIN 10 MG-100 MG/5 ML SYRUP: 100-10 mg/5 mL | ORAL | Qty: 10

## 2020-05-14 MED FILL — CETIRIZINE 10 MG TAB: 10 mg | ORAL | Qty: 1

## 2020-05-14 MED FILL — PRAVASTATIN 20 MG TAB: 20 mg | ORAL | Qty: 2

## 2020-05-14 MED FILL — OLUMIANT 2 MG TABLET: 2 mg | ORAL | Qty: 2

## 2020-05-14 MED FILL — DEXAMETHASONE SODIUM PHOSPHATE 4 MG/ML IJ SOLN: 4 mg/mL | INTRAMUSCULAR | Qty: 3

## 2020-05-14 MED FILL — CHOLECALCIFEROL (VITAMIN D3) 1,000 UNIT (25 MCG) TAB: ORAL | Qty: 2

## 2020-05-14 MED FILL — LAMOTRIGINE 25 MG TAB: 25 mg | ORAL | Qty: 2

## 2020-05-14 MED FILL — PANTOPRAZOLE 40 MG TAB, DELAYED RELEASE: 40 mg | ORAL | Qty: 1

## 2020-05-14 MED FILL — WARFARIN 2.5 MG TAB: 2.5 mg | ORAL | Qty: 1
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
## 2020-05-14 NOTE — Progress Notes (Signed)
Progress Notes by Mary Pheasant, MD at 05/14/20 [phone]                Author: Einar Pheasant, MD  Service: FAMILY MEDICINE  Author Type: Resident       Filed: 05/14/20 0859  Date of Service: 05/14/20 5329  Status: Attested           Editor: Mary Pheasant, MD (Resident)  Cosigner: Lurlean Leyden, MD at 05/14/20 1321          Attestation signed by Lurlean Leyden, MD at 05/14/20 1321                  See resident's note for details. above.  I have personally seen and evaluated this patient.  I have discussed this patient with resident and team.  I have reviewed resident note and  agree with above.  Titrate O2.                                               [facility] Family Medicine   Intern Progress Note         Patient: Mary [name]  MRN: [id number]        SSN: [id number]   Date of Birth: [birth year]        Age: 54 y.o.   Sex: female         Admit Date: 05/03/2020     LOS: 11 days      Chief Complaint       Patient presents with        ?  Shortness of Breath     ?  Chest Pain     ?  Cough        ?  Fever             Subjective:     No overnight calls as per night team       Patient was seen at bedside this morning and had no complaints or concerns. States she is eating very well and still currently on 25L hi-flow      ROS :    Denies chest pain, shortness of breath, abdominal pain      Objective:        Visit Vitals      BP  (!) 126/57 (BP 1 Location: Right upper arm, BP Patient Position: At rest)     Pulse  61     Temp  97.9 ??F (36.6 ??C)     Resp  19     Ht  5\' 3"  (1.6 m)     Wt  88.4 kg (194 lb 12.8 oz)     SpO2  98%     Breastfeeding  No        BMI  34.51 kg/m??           Physical Exam:    General:?? NAD, AOx3, 25L hi flow   CV: ??RRR, no M/G/R   RESP:??Unlabored breathing.??CTAB. Equal expansion bilaterally.    ABD:????Soft, nontender, nondistended.    Ext:????No edema, erythema, or tenderness.??   Skin: Warm &??dry. No rashes, lesions, or ulcers. ??Good turgor.    Psych:??Appropriate??mood and affect.      Intake and  Output:   Current Shift: No intake/output data recorded.   Last three shifts: 12/28 1901 - 12/30 0700   In: 240 [P.O.:240]   Out: 500 [  Urine:500]      Lab/Data Review:     Recent Results (from the past 12 hour(s))     METABOLIC PANEL, BASIC          Collection Time: 05/14/20  3:13 AM         Result  Value  Ref Range            Sodium  141  136 - 145 mmol/L       Potassium  4.8  3.5 - 5.5 mmol/L       Chloride  110  100 - 111 mmol/L       CO2  26  21 - 32 mmol/L       Anion gap  5  3.0 - 18 mmol/L       Glucose  109 (H)  74 - 99 mg/dL       BUN  33 (H)  7.0 - 18 MG/DL       Creatinine  1.611.02  0.6 - 1.3 MG/DL       BUN/Creatinine ratio  32 (H)  12 - 20         GFR est AA  >60  >60 ml/min/1.7573m2       GFR est non-AA  57 (L)  >60 ml/min/1.2173m2       Calcium  8.7  8.5 - 10.1 MG/DL       CBC WITH AUTOMATED DIFF          Collection Time: 05/14/20  3:13 AM         Result  Value  Ref Range            WBC  11.3  4.6 - 13.2 K/uL       RBC  3.53 (L)  4.20 - 5.30 M/uL       HGB  10.4 (L)  12.0 - 16.0 g/dL       HCT  09.632.3 (L)  04.535.0 - 45.0 %       MCV  91.5  78.0 - 100.0 FL       MCH  29.5  24.0 - 34.0 PG       MCHC  32.2  31.0 - 37.0 g/dL       RDW  40.913.3  81.111.6 - 14.5 %       PLATELET  359  135 - 420 K/uL       MPV  9.9  9.2 - 11.8 FL       NRBC  0.0  0 PER 100 WBC       ABSOLUTE NRBC  0.00  0.00 - 0.01 K/uL       NEUTROPHILS  78 (H)  40 - 73 %       BAND NEUTROPHILS  4  0 - 5 %       LYMPHOCYTES  11 (L)  21 - 52 %       MONOCYTES  7  3 - 10 %       EOSINOPHILS  0  0 - 5 %       BASOPHILS  0  0 - 2 %       IMMATURE GRANULOCYTES  0  %       ABS. NEUTROPHILS  9.3 (H)  1.8 - 8.0 K/UL       ABS. LYMPHOCYTES  1.2  0.9 - 3.6 K/UL       ABS. MONOCYTES  0.8  0.05 - 1.2 K/UL  ABS. EOSINOPHILS  0.0  0.0 - 0.4 K/UL       ABS. BASOPHILS  0.0  0.0 - 0.1 K/UL       ABS. IMM. GRANS.  0.0  K/UL       DF  MANUAL          PLATELET COMMENTS  ADEQUATE PLATELETS          RBC COMMENTS  NORMOCYTIC, NORMOCHROMIC          PROCALCITONIN           Collection Time: 05/14/20  3:13 AM         Result  Value  Ref Range            Procalcitonin  <0.05  ng/mL       C REACTIVE PROTEIN, QT          Collection Time: 05/14/20  3:13 AM         Result  Value  Ref Range            C-Reactive protein  <0.3  0 - 0.3 mg/dL       D DIMER          Collection Time: 05/14/20  3:13 AM         Result  Value  Ref Range            D DIMER  0.51 (H)  <0.46 ug/ml(FEU)       FERRITIN          Collection Time: 05/14/20  3:13 AM         Result  Value  Ref Range            Ferritin  951 (H)  8 - 388 NG/ML       PROTHROMBIN TIME + INR          Collection Time: 05/14/20  3:13 AM         Result  Value  Ref Range            Prothrombin time  31.1 (H)  11.5 - 15.2 sec            INR  3.0 (H)  0.8 - 1.2                     Assessment and Plan:     This is a 54 y.o.??female??with PMH bioprosthetic mitral valve replacement/Maze procedure/LAA appendage ligation in 2017, Afib(on warfarin bridged  with Lovenox), HTN, Smoker,??Bipolar 1 disorder/PTSD,??stable angina pectoris(s/p LHC)??now admitted with??acute hypoxic respiratory failure in the setting of COVID-19.??Vasovagal event??on 12/23??during BM which delayed discharge, but??her  VS are improved and have been stable.????   ??   Acute Hypoxic Respiratory failure 2/2 COVID-19 PNA:    Improving, fevers improved.??Breathing and cough improving. ??Doxycycline??started for thought of an atypical pneumonia. CXR??12/26??Mild interstitial edema with small effusions and bibasilar atelectasis, increased from the comparison exam. Currently  on 50 L hi-flow   PLAN:????   -??continue hi-flow - wean as tolerated   - PT/OT/CM   - Infectious disease following, appreciate recommendations    - Continue Doxycycline??12/28   - Trend CBC, BMP, CRP, D-dimer, Ferritin, Procal   - Continue dexamethasone IV??10??mg q12 hrs??beginning 12/27 as per ID??   - Contact precautions    - Continue home inhalers at this time    - Quarantine for 10 days after symptom onset/positive test   -Continue  baricitnib      pAfib currently NSR   -  INR??2>2.3>3.5>4>3.6   - Pharmacy recommendations reviewed, appreciate assistance   PLAN:??   - Hold Metoprolol, restart at outpatient follow up   -Warfarin held 12/27 - INR subtherapeutic of 4   -Pharmacy will recheck INR daily and adjust dose accordingly. Will continue to follow INR trend and patient's clinical progress daily.      ARDS   Initially mild with initial P/F ratio was 237, however pt decompensated, now on HFNC, with with elevated FiO2   - pt having worsening ARDS despite receiving 11 days of steroids.    -Dose has been increased to ARDS dosing, which may prove beneficial however there is no reliable data.     -Dr. Allena Katz would recommend no more than 5 days of higher dose decadron be given since the risk for complications will increase significantly with prolonged use -- most concerning risk is opportunistic infection (especially with baricitinib use)    - duonebs q4h, pulmicort nebs 1mg  BID, and albuterol PRN      Bioprosthetic mitral valve replacement/Maze procedure/LAA appendage ligation in 2017/stable angina pectoris(s/p LHC):   LHC significant for??Prox RCA lesion 30% stenosed.????Otherwise unremarkable. ??BNP 1579,??negative high-sensitivity troponin x 2   ????????????????????????- Hold Lasix 40 mg, restart at outpatient follow up   ????????????????????????- TTE results reviewed    ??   Tobacco dependence/questionable COPD:   Patient with 40-pack-year smoking history. ??Current every day smoker.   ????????????????????????- Continue home inhalers   ????????????????????????- No nebulizer treatments in the setting of COVID-19 infection   ????????????????????????- Nicotine patch if needed, encourage cessation of tobacco use on discharge??and follow-up   ??   Bipolar 1 disorder/PTSD:   Written for Lamotrigine 75 mg daily by Psychiatrist but patient states taking 50mg  at 75mg  is too high, causes sedation and confusion   ????????????????????????- Continue Lamotrigine 50mg  daily   ??   AKI - resolved   Cr - 0.69>0.76>0.69>0.71>0.75>1.02    ????????????????????????- Encourage PO intake   ??      Diet  ??Cardiac diet     DVT Prophylaxis  ??Lovenox 90 mg twice daily     GI Prophylaxis  ??Protonix 40 mg daily     Code status  ??Full     Disposition  ??Home     ??      Point of Contact  Mary [name]??   Relationship:??God-Daughter   ([phone])??          MD, PGY-1     [facility] Family Medicine     Intern Pager: [phone]       May 14, 2020, 8:39 AM

## 2020-05-14 NOTE — Progress Notes (Signed)
Pharmacist Consult: Warfarin Management    Assessment/Plan  INR is therapeutic: 3  Will give one time dose of warfarin 2.5 mg today.   Recheck INR daily and adjust dose accordingly. Will continue to follow INR trend and patient's clinical progress daily.     Subjective/Objective  Patient on warfarin therapy for Atrial Fibrillation.   Target goal INR of 2 - 3  Patient is has been on warfarin PTA. Patient's home dose is 5 mg MWF, 3.75 mg TTSS.   Patient was here last week for cardiac cath. She was sent home with warfarin + lovenox bridge. Patient INR low at 1.3, compliance vs. Need for increased dose. She has been same dose since ~10/21.     Date 12/26 12/27 12/28  12/29 10/30     INR 3.5 4 3.6 3.9 3     Warfarin Dose (mg) HOLD HOLD HOLD HOLD 2.5 mg       Date 12/19 12/20 12/21  12/22 12/23 12/24  12/25   INR 1.3 1.3 1.6 1.7 2.1 2.0 2.3   Warfarin Dose (mg) 5 mg 5 mg 3.75 mg 5mg  3.75 5 3.75       Bleeding/Sensitivity Risk Factors: Hx variable INRs, Recent Trauma or Surgery, and Hypertension  Potentially interacting medications include:   Drugs that may increase INR: dexamethasone  Drugs that may decrease INR: None  Other anticoagulants include: None    Thank you,  Maeola Sarah, [facility]  05/14/2020

## 2020-05-14 NOTE — Progress Notes (Signed)
 05/14/20 2031   Oxygen Therapy   O2 Sat (%) 92 %   Pulse via Oximetry 79 beats per minute   O2 Device Hi flow nasal cannula   Skin Assessment Clean, dry, & intact   O2 Flow Rate (L/min) 30 l/min   O2 Temperature 87.8 F (31 C)   FIO2 (%) 40 %

## 2020-05-14 NOTE — Progress Notes (Signed)
Problem: Falls - Risk of  Goal: *Absence of Falls  Description: Document Mary [name] Fall Risk and appropriate interventions in the flowsheet.  Outcome: Progressing Towards Goal  Note: Fall Risk Interventions:  Mobility Interventions: Bed/chair exit alarm,Communicate number of staff needed for ambulation/transfer,Patient to call before getting OOB         Medication Interventions: Bed/chair exit alarm,Evaluate medications/consider consulting pharmacy,Patient to call before getting OOB    Elimination Interventions: Bed/chair exit alarm,Call light in reach,Patient to call for help with toileting needs              Problem: Patient Education: Go to Patient Education Activity  Goal: Patient/Family Education  Outcome: Progressing Towards Goal     Problem: Pain  Goal: *Control of Pain  Outcome: Progressing Towards Goal  Goal: *PALLIATIVE CARE:  Alleviation of Pain  Outcome: Progressing Towards Goal     Problem: Patient Education: Go to Patient Education Activity  Goal: Patient/Family Education  Outcome: Progressing Towards Goal     Problem: Activity Intolerance  Goal: *Oxygen saturation during activity within specified parameters  Outcome: Progressing Towards Goal  Goal: *Able to remain out of bed as prescribed  Outcome: Progressing Towards Goal     Problem: Patient Education: Go to Patient Education Activity  Goal: Patient/Family Education  Outcome: Progressing Towards Goal

## 2020-05-14 NOTE — Progress Notes (Signed)
Infectious Disease progress Note        Reason: COVID-19 pneumonia    Current abx Prior abx   Doxycycline since 05/06/20-12/29      Lines:       Assessment :  54 y.o. 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    Malaise for about one week  S/p cardiac cath about a week ago    Clinical presentation consistent with acute hypoxia-present on admission due to COVID-19 pneumonia       Persistent hypoxia likely due to covid-19 infection superimposed on underlying emphysema    Increasing oxygen requirement-likely due to progressive COVID-19 pneumonia- currently on 30 L high flow oxygen    Worsening hypoxia, worsening CRP on 05/11/2020-high risk for clinical deterioration.  Hence switched to high dose decadron, baricitinib    Low procalcitonin argues against superimposed bacterial infection.      Improved inflammatory markers.    Clinically better. Appeared comfortable on high flow oxygen at 12L    Recommendations:    1.  Continue Decadron 10 mg IV every 12 hours till 12/31 followed by 10 mg IV q 24 hour till 05/21/19.  Continue baricitinib for now  2.   continue Robitussin-DM.   3. Continue diuretics per primary team  4.  Continue anticoagulation per primary team/pulmonary  5.  Monitor CRP, D-dimer, procalcitonin  6.  Titrate oxygen as tolerated  7. F/u pulmonary recommendations       Above plan was discussed in details with patient,and primary team. Please call me if any further questions or concerns. Will continue to participate in the care of this patient.    HPI:  States that she feels better.  Improved cough   Denies pleuritic chest pain, abdominal pain, diarrhea, nausea.    Past Medical History:   Diagnosis Date   ??? Anxiety    ??? Bipolar 1 disorder (HCC)    ??? CHF (congestive  heart failure) (HCC)    ??? COPD (chronic obstructive pulmonary disease) (HCC)    ??? COPD (chronic obstructive pulmonary disease) (HCC)    ??? H/O aortic valve replacement    ??? PTSD (post-traumatic stress disorder)        Past Surgical History:   Procedure Laterality Date   ??? HX HEART CATHETERIZATION     ??? HX HEART VALVE SURGERY         home Medication List    Details   hydrOXYzine HCL (ATARAX) 25 mg tablet Take  by mouth nightly as needed.      enoxaparin (Lovenox) 40 mg/0.4 mL 60 mg by SubCUTAneous route every twelve (12) hours every twelve (12) hours.      cyclobenzaprine (FLEXERIL) 5 mg tablet Take 5 mg by mouth nightly as needed for Muscle Spasm(s).      acetaminophen (Tylenol Extra Strength) 500 mg tablet Take 1,000 mg by mouth every eight (8) hours as needed for Pain.      pregabalin (LYRICA) 200 mg capsule take 1 capsule by mouth three times a day  Qty: 90 Capsule, Refills: 1    Associated Diagnoses: Neck pain; Chronic pain syndrome      albuterol (PROVENTIL HFA, VENTOLIN HFA, PROAIR HFA) 90 mcg/actuation inhaler Take 2 Puffs by inhalation every six (6) hours as needed.      cetirizine (ZYRTEC) 10 mg tablet Take  10 mg by mouth daily.      fluticasone propionate (FLONASE) 50 mcg/actuation nasal spray 1 Spray by Nasal route daily.      furosemide (LASIX) 40 mg tablet TAKE ONE TABLET BY MOUTH EVERY DAY      Combivent Respimat 20-100 mcg/actuation inhaler INHALE 1 PUFF BY MOUTH INTO LUNGS EVERY 6 HOURS      lamoTRIgine (LaMICtal) 25 mg tablet TAKE 3 TABLETS BY MOUTH ONCE DAILY      metoprolol succinate (TOPROL-XL) 25 mg XL tablet       omeprazole (PRILOSEC) 40 mg capsule Take 40 mg by mouth daily.      pravastatin (PRAVACHOL) 40 mg tablet Take 40 mg by mouth nightly.        warfarin (COUMADIN) 2.5 mg tablet TAKE 2 TABLETS BY MOUTH ONCE DAILY ON MONDAY WEDNESDAY FRIDAY AND TAKE 1 & 1 2 (ONE AND ONE HALF) ALL OTHER DAYS             Current Facility-Administered Medications   Medication Dose Route Frequency   ???  albuterol-ipratropium (DUO-NEB) 2.5 MG-0.5 MG/3 ML  3 mL Nebulization Q6H RT   ??? budesonide (PULMICORT) 1,000 mcg/2 mL nebulizer susp  1,000 mcg Nebulization BID RT   ??? lidocaine 4 % patch 1 Patch  1 Patch TransDERmal DAILY PRN   ??? dexamethasone (DECADRON) 4 mg/mL injection 10 mg  10 mg IntraVENous Q12H   ??? baricitinib (OLUMIANT) tablet 4 mg  4 mg Oral DAILY   ??? ibuprofen (MOTRIN) tablet 600 mg  600 mg Oral Q6H   ??? guaiFENesin-dextromethorphan (ROBITUSSIN DM) 100-10 mg/5 mL syrup 5 mL  5 mL Oral Q6H   ??? lamoTRIgine (LaMICtal) tablet 50 mg  50 mg Oral DAILY   ??? acetaminophen (TYLENOL) tablet 1,000 mg  1,000 mg Oral Q8H   ??? sodium chloride (NS) flush 5-40 mL  5-40 mL IntraVENous Q8H   ??? sodium chloride (NS) flush 5-40 mL  5-40 mL IntraVENous PRN   ??? ondansetron (ZOFRAN ODT) tablet 4 mg  4 mg Oral Q8H PRN    Or   ??? ondansetron (ZOFRAN) injection 4 mg  4 mg IntraVENous Q6H PRN   ??? cholecalciferol (VITAMIN D3) (1000 Units /25 mcg) tablet 2,000 Units  2,000 Units Oral DAILY   ??? cetirizine (ZYRTEC) tablet 10 mg  10 mg Oral DAILY   ??? fluticasone propionate (FLONASE) 50 mcg/actuation nasal spray 2 Spray  2 Spray Both Nostrils DAILY   ??? [Held by provider] furosemide (LASIX) tablet 40 mg  40 mg Oral DAILY   ??? hydrOXYzine HCL (ATARAX) tablet 25 mg  25 mg Oral TID PRN   ??? pantoprazole (PROTONIX) tablet 40 mg  40 mg Oral DAILY   ??? pravastatin (PRAVACHOL) tablet 40 mg  40 mg Oral QHS   ??? pregabalin (LYRICA) capsule 200 mg  200 mg Oral TID   ??? [Held by provider] metoprolol succinate (TOPROL-XL) XL tablet 25 mg  25 mg Oral DAILY   ??? albuterol (PROVENTIL HFA, VENTOLIN HFA, PROAIR HFA) inhaler 2 Puff  2 Puff Inhalation Q6H PRN   ??? Warfarin - Pharmacy to Dose   Other Rx Dosing/Monitoring       Allergies: Adhesive and Lithium    Family History   Problem Relation Age of Onset   ??? No Known Problems Mother      Social History     Socioeconomic History   ??? Marital status: DIVORCED     Spouse name: Not on file   ???  Number of children: Not on  file   ??? Years of education: Not on file   ??? Highest education level: Not on file   Occupational History   ??? Not on file   Tobacco Use   ??? Smoking status: Current Every Day Smoker     Packs/day: 1.00   ??? Smokeless tobacco: Never Used   Substance and Sexual Activity   ??? Alcohol use: Never   ??? Drug use: Yes     Types: Marijuana   ??? Sexual activity: Not on file   Other Topics Concern   ??? Not on file   Social History Narrative   ??? Not on file     Social Determinants of Health     Financial Resource Strain:    ??? Difficulty of Paying Living Expenses: Not on file   Food Insecurity:    ??? Worried About Running Out of Food in the Last Year: Not on file   ??? Ran Out of Food in the Last Year: Not on file   Transportation Needs:    ??? Lack of Transportation (Medical): Not on file   ??? Lack of Transportation (Non-Medical): Not on file   Physical Activity:    ??? Days of Exercise per Week: Not on file   ??? Minutes of Exercise per Session: Not on file   Stress:    ??? Feeling of Stress : Not on file   Social Connections:    ??? Frequency of Communication with Friends and Family: Not on file   ??? Frequency of Social Gatherings with Friends and Family: Not on file   ??? Attends Religious Services: Not on file   ??? Active Member of Clubs or Organizations: Not on file   ??? Attends BankerClub or Organization Meetings: Not on file   ??? Marital Status: Not on file   Intimate Partner Violence:    ??? Fear of Current or Ex-Partner: Not on file   ??? Emotionally Abused: Not on file   ??? Physically Abused: Not on file   ??? Sexually Abused: Not on file   Housing Stability:    ??? Unable to Pay for Housing in the Last Year: Not on file   ??? Number of Places Lived in the Last Year: Not on file   ??? Unstable Housing in the Last Year: Not on file     Social History     Tobacco Use   Smoking Status Current Every Day Smoker   ??? Packs/day: 1.00   Smokeless Tobacco Never Used        Temp (24hrs), Avg:98 ??F (36.7 ??C), Min:97.5 ??F (36.4 ??C), Max:98.8 ??F (37.1 ??C)    Visit Vitals  BP  (!) 126/57 (BP 1 Location: Right upper arm, BP Patient Position: At rest)   Pulse 61   Temp 97.9 ??F (36.6 ??C)   Resp 19   Ht 5\' 3"  (1.6 m)   Wt 88.4 kg (194 lb 12.8 oz)   SpO2 98%   Breastfeeding No   BMI 34.51 kg/m??       ROS: 12 point ROS obtained in details. Pertinent positives as mentioned in HPI,   otherwise negative    Physical Exam:    Vitals signs and nursing note reviewed.   Constitutional:    Sitting on bed.  Alert awake oriented x3, appears more comfortable than prior exam  HENT:      Head: Normocephalic.   Eyes:      Conjunctiva/sclera: Conjunctivae normal.  Neck:      Musculoskeletal: Normal range of motion and neck supple.   Cardiovascular:      Rate and Rhythm: Normal rate and regular rhythm on monitor  Chest:      Bilateral chest movements equal.  Auscultation deferred due to Covid positive  Abdominal:      General: There is no distension.      Palpations: Abdomen is soft.      Tenderness: There is no abdominal tenderness. There is no rebound.   Musculoskeletal: Normal range of motion.         General: No tenderness.   Skin:     General: Skin is warm and dry.      Findings: No rash.   Neurological:      Mental Status:  alert and oriented to person, place, and time.      Cranial Nerves: No cranial nerve deficit.      Motor: No abnormal muscle tone.      Coordination: Coordination normal.   Psychiatric:         Behavior: Behavior normal.         Thought Content: Thought content normal.         Judgment: Judgment normal.     Labs: Results:   Chemistry Recent Labs     05/14/20  0313 05/13/20  0449 05/12/20  0130   GLU 109* 128* 131*   NA 141 140 141   K 4.8 4.1 3.9   CL 110 110 110   CO2 26 24 26    BUN 33* 20* 16   CREA 1.02 0.75 0.71   CA 8.7 9.3 8.8   AGAP 5 6 5    BUCR 32* 27* 23*      CBC w/Diff Recent Labs     05/14/20  0313 05/13/20  0449 05/12/20  0130   WBC 11.3 10.5 5.2   RBC 3.53* 3.73* 3.98*   HGB 10.4* 10.6* 11.5*   HCT 32.3* 33.8* 35.8   PLT 359 318 274   GRANS 78* 79* 89*   LYMPH 11* 16*  11*   EOS 0 0 0      Microbiology No results for input(s): CULT in the last 72 hours.       RADIOLOGY:    All available imaging studies/reports in connect care for this admission were reviewed      Disclaimer: Sections of this note are dictated utilizing voice recognition software, which may have resulted in some phonetic based errors in grammar and contents. Even though attempts were made to correct all the mistakes, some may have been missed, and remained in the body of the document. If questions arise, please contact our department.    Dr. 05/15/20, Infectious Disease Specialist  [phone]  May 14, 2020  8:54 AM

## 2020-05-14 NOTE — Progress Notes (Signed)
1915: Bedside shift change report given to [place], Charity fundraiser (Cabin crew) by Shearon Balo, RN (offgoing nurse). Report included the following information SBAR, Kardex, Intake/Output, MAR and Cardiac Rhythm NSR.     Bedside shift change report given to Shearon Balo, RN (oncoming nurse) by Vela Prose, RN (offgoing nurse). Report included the following information SBAR, Kardex, Intake/Output, MAR and Cardiac Rhythm NSR.

## 2020-05-15 LAB — METABOLIC PANEL, BASIC
Anion gap: 5 mmol/L (ref 3.0–18)
BUN/Creatinine ratio: 41 — ABNORMAL HIGH (ref 12–20)
BUN: 39 MG/DL — ABNORMAL HIGH (ref 7.0–18)
CO2: 27 mmol/L (ref 21–32)
Calcium: 8.7 MG/DL (ref 8.5–10.1)
Chloride: 110 mmol/L (ref 100–111)
Creatinine: 0.94 MG/DL (ref 0.6–1.3)
GFR est AA: >60 mL/min/{1.73_m2} (ref >60)
GFR est non-AA: >60 mL/min/{1.73_m2} (ref >60)
Glucose: 115 mg/dL — ABNORMAL HIGH (ref 74–99)
Potassium: 4.6 mmol/L (ref 3.5–5.5)
Sodium: 142 mmol/L (ref 136–145)

## 2020-05-15 LAB — FERRITIN
Ferritin: 793 NG/ML — ABNORMAL HIGH (ref 8–388)
Ferritin: 793 NG/ML — ABNORMAL HIGH (ref 8–388)

## 2020-05-15 LAB — CBC WITH AUTOMATED DIFF
ABS. BASOPHILS: 0 10*3/uL (ref 0.0–0.1)
ABS. EOSINOPHILS: 0.1 10*3/uL (ref 0.0–0.4)
ABS. IMM. GRANS.: 0 10*3/uL (ref 0.00–0.04)
ABS. LYMPHOCYTES: 1.1 10*3/uL (ref 0.9–3.6)
ABS. MONOCYTES: 0.7 10*3/uL (ref 0.05–1.2)
ABS. NEUTROPHILS: 9 10*3/uL — ABNORMAL HIGH (ref 1.8–8.0)
ABSOLUTE NRBC: 0 10*3/uL (ref 0.00–0.01)
BASOPHILS: 0 % (ref 0–2)
EOSINOPHILS: 1 % (ref 0–5)
HCT: 33.8 % — ABNORMAL LOW (ref 35.0–45.0)
HGB: 10.9 g/dL — ABNORMAL LOW (ref 12.0–16.0)
IMMATURE GRANULOCYTES: 0 % (ref 0.0–0.5)
LYMPHOCYTES: 10 % — ABNORMAL LOW (ref 21–52)
MCH: 29.1 PG (ref 24.0–34.0)
MCHC: 32.2 g/dL (ref 31.0–37.0)
MCV: 90.4 FL (ref 78.0–100.0)
METAMYELOCYTES: 1 %
MONOCYTES: 6 % (ref 3–10)
MPV: 9.8 FL (ref 9.2–11.8)
NEUTROPHILS: 82 % — ABNORMAL HIGH (ref 40–73)
NRBC: 0 PER 100 WBC
PLATELET COMMENTS: ADEQUATE
PLATELET: 414 10*3/uL (ref 135–420)
RBC: 3.74 M/uL — ABNORMAL LOW (ref 4.20–5.30)
RDW: 12.9 % (ref 11.6–14.5)
WBC: 11 10*3/uL (ref 4.6–13.2)

## 2020-05-15 LAB — PROCALCITONIN
Procalcitonin: <0.05 ng/mL
Procalcitonin: <0.05 ng/mL

## 2020-05-15 LAB — PROTHROMBIN TIME + INR
INR: 2.6 — ABNORMAL HIGH (ref 0.8–1.2)
Prothrombin time: 27.2 s — ABNORMAL HIGH (ref 11.5–15.2)

## 2020-05-15 LAB — CULTURE, URINE
Culture result:: NO GROWTH
Culture: NO GROWTH

## 2020-05-15 LAB — C REACTIVE PROTEIN, QT: C-Reactive protein: <0.3 mg/dL (ref 0–0.3)

## 2020-05-15 LAB — D DIMER: D DIMER: 0.68 ug/ml(FEU) — ABNORMAL HIGH (ref <0.46)

## 2020-05-15 LAB — CBC WITH AUTO DIFFERENTIAL
Basophils %: 0 % (ref 0–2)
Basophils Absolute: 0 10*3/uL (ref 0.0–0.1)
Eosinophils %: 1 % (ref 0–5)
Eosinophils Absolute: 0.1 10*3/uL (ref 0.0–0.4)
Granulocyte Absolute Count: 0 10*3/uL (ref 0.00–0.04)
Hematocrit: 33.8 % — ABNORMAL LOW (ref 35.0–45.0)
Hemoglobin: 10.9 g/dL — ABNORMAL LOW (ref 12.0–16.0)
Immature Granulocytes: 0 % (ref 0.0–0.5)
Lymphocytes %: 10 % — ABNORMAL LOW (ref 21–52)
Lymphocytes Absolute: 1.1 10*3/uL (ref 0.9–3.6)
MCH: 29.1 PG (ref 24.0–34.0)
MCHC: 32.2 g/dL (ref 31.0–37.0)
MCV: 90.4 FL (ref 78.0–100.0)
MPV: 9.8 FL (ref 9.2–11.8)
Metamyelocytes Relative: 1 %
Monocytes %: 6 % (ref 3–10)
Monocytes Absolute: 0.7 10*3/uL (ref 0.05–1.2)
NRBC Absolute: 0 10*3/uL (ref 0.00–0.01)
Neutrophils %: 82 % — ABNORMAL HIGH (ref 40–73)
Neutrophils Absolute: 9 10*3/uL — ABNORMAL HIGH (ref 1.8–8.0)
Nucleated RBCs: 0 PER 100 WBC
Platelet Comment: ADEQUATE
Platelets: 414 10*3/uL (ref 135–420)
RBC: 3.74 M/uL — ABNORMAL LOW (ref 4.20–5.30)
RDW: 12.9 % (ref 11.6–14.5)
WBC: 11 10*3/uL (ref 4.6–13.2)

## 2020-05-15 LAB — BASIC METABOLIC PANEL
Anion Gap: 5 mmol/L (ref 3.0–18)
BUN: 39 MG/DL — ABNORMAL HIGH (ref 7.0–18)
Bun/Cre Ratio: 41 — ABNORMAL HIGH (ref 12–20)
CO2: 27 mmol/L (ref 21–32)
Calcium: 8.7 MG/DL (ref 8.5–10.1)
Chloride: 110 mmol/L (ref 100–111)
Creatinine: 0.94 MG/DL (ref 0.6–1.3)
EGFR IF NonAfrican American: >60 mL/min/{1.73_m2} (ref >60)
GFR African American: >60 mL/min/{1.73_m2} (ref >60)
Glucose: 115 mg/dL — ABNORMAL HIGH (ref 74–99)
Potassium: 4.6 mmol/L (ref 3.5–5.5)
Sodium: 142 mmol/L (ref 136–145)

## 2020-05-15 LAB — C-REACTIVE PROTEIN: CRP: <0.3 mg/dL (ref 0–0.3)

## 2020-05-15 LAB — PROTIME-INR
INR: 2.6 — ABNORMAL HIGH (ref 0.8–1.2)
Protime: 27.2 s — ABNORMAL HIGH (ref 11.5–15.2)

## 2020-05-15 LAB — D-DIMER, QUANTITATIVE: D-Dimer, Quant: 0.68 ug/ml(FEU) — ABNORMAL HIGH (ref <0.46)

## 2020-05-15 MED ORDER — WARFARIN 1 MG TAB
1 mg | Freq: Once | ORAL | Status: DC
Start: 2020-05-15 — End: 2020-05-15

## 2020-05-15 MED ORDER — DEXAMETHASONE SODIUM PHOSPHATE 4 MG/ML IJ SOLN
4 mg/mL | Freq: Every day | INTRAMUSCULAR | Status: DC
Start: 2020-05-15 — End: 2020-05-15

## 2020-05-15 MED ORDER — CHOLECALCIFEROL (VITAMIN D3) 1,000 UNIT (25 MCG) TAB
ORAL_TABLET | Freq: Every day | ORAL | 3 refills | Status: DC
Start: 2020-05-15 — End: 2020-06-03

## 2020-05-15 MED ORDER — DEXAMETHASONE 4 MG TAB
4 mg | Freq: Every day | ORAL | Status: DC
Start: 2020-05-15 — End: 2020-05-15

## 2020-05-15 MED ORDER — DEXAMETHASONE 20 MG TABLET
20 mg | ORAL_TABLET | Freq: Every day | ORAL | 0 refills | Status: DC
Start: 2020-05-15 — End: 2020-05-15

## 2020-05-15 MED ORDER — DEXAMETHASONE 2 MG TAB
2 mg | Freq: Every day | ORAL | Status: DC
Start: 2020-05-15 — End: 2020-05-15

## 2020-05-15 MED ORDER — DEXTROMETHORPHAN-GUAIFENESIN 10 MG-100 MG/5 ML SYRUP
100-10 mg/5 mL | Freq: Four times a day (QID) | ORAL | 0 refills | Status: AC
Start: 2020-05-15 — End: 2020-05-25

## 2020-05-15 MED ORDER — PREDNISONE 10 MG TAB
10 mg | ORAL_TABLET | Freq: Every day | ORAL | 0 refills | Status: AC
Start: 2020-05-15 — End: 2020-05-21

## 2020-05-15 MED FILL — CHOLECALCIFEROL (VITAMIN D3) 1,000 UNIT (25 MCG) TAB: ORAL | Qty: 2

## 2020-05-15 MED FILL — BUDESONIDE 1 MG/2 ML NEB SUSPENSION: 1 mg/2 mL | RESPIRATORY_TRACT | Qty: 1

## 2020-05-15 MED FILL — DEXTROMETHORPHAN-GUAIFENESIN 10 MG-100 MG/5 ML SYRUP: 100-10 mg/5 mL | ORAL | Qty: 10

## 2020-05-15 MED FILL — JANTOVEN 1 MG TABLET: 1 mg | ORAL | Qty: 3

## 2020-05-15 MED FILL — IPRATROPIUM-ALBUTEROL 2.5 MG-0.5 MG/3 ML NEB SOLUTION: 2.5 mg-0.5 mg/3 ml | RESPIRATORY_TRACT | Qty: 3

## 2020-05-15 MED FILL — IBUPROFEN 200 MG TAB: 200 mg | ORAL | Qty: 1

## 2020-05-15 MED FILL — PANTOPRAZOLE 40 MG TAB, DELAYED RELEASE: 40 mg | ORAL | Qty: 1

## 2020-05-15 MED FILL — ACETAMINOPHEN 500 MG TAB: 500 mg | ORAL | Qty: 2

## 2020-05-15 MED FILL — LYRICA 25 MG CAPSULE: 25 mg | ORAL | Qty: 2

## 2020-05-15 MED FILL — LAMOTRIGINE 25 MG TAB: 25 mg | ORAL | Qty: 2

## 2020-05-15 MED FILL — OLUMIANT 2 MG TABLET: 2 mg | ORAL | Qty: 2

## 2020-05-15 MED FILL — DEXAMETHASONE SODIUM PHOSPHATE 4 MG/ML IJ SOLN: 4 mg/mL | INTRAMUSCULAR | Qty: 3

## 2020-05-15 MED FILL — PRAVASTATIN 20 MG TAB: 20 mg | ORAL | Qty: 2

## 2020-05-15 MED FILL — CETIRIZINE 10 MG TAB: 10 mg | ORAL | Qty: 1

## 2020-05-15 NOTE — Discharge Summary (Signed)
Discharge Summary by Larae Grooms, MD at 05/15/20 [place]                Author: Larae Grooms, MD  Service: FAMILY MEDICINE  Author Type: Resident       Filed: 05/18/20 0734  Date of Service: 05/15/20 1248  Status: Attested           Editor: Larae Grooms, MD (Resident)  Cosigner: Myrle Sheng, MD at 05/18/20 1109          Attestation signed by Myrle Sheng, MD at 05/18/20 1109                  See resident's note for details. above.  I have personally seen and evaluated this patient.  I have discussed this patient with resident and team.  I have reviewed resident note and  agree with above.  Seen on date of discharge.  See progress notes for details.                                                [facility]   Discharge Summary          Patient: Mary [name]  MRN: [id number]   CSN: [id number]          Date of Birth: Jan 16, 1966   Age: 54 y.o.   Sex: female            Admission Date: 05/03/2020  Discharge Date: 05/15/20        Attending: No att. 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 Held for 4 days. Initially on 60m; titrated down to 2.531m    -Please recheck INR.   -Discharged on 3L home O2. Please assess for continued O2 supplementation.   -please ensure patient is taking  40 mg prednisone daily for 5 days since date of discharge    -please ask about smoking cessation - and offer nitocine patch. Patient has a 40 pack year smoking history    Pending labs and studies:   none      Operative Procedures:    none      Discharge Medications:         Discharge Medication List as of 05/15/2020  3:42 PM              START taking these medications          Details        predniSONE (DELTASONE) 10 mg tablet  Take 40 mg by mouth daily for 5 days., Normal, Disp-20 Tablet, R-0               cholecalciferol (VITAMIN D3) (1000 Units /25 mcg) tablet  Take 2 Tablets by mouth daily., Normal, Disp-30 Tablet, R-3               guaiFENesin-dextromethorphan (ROBITUSSIN DM) 100-10 mg/5 mL  syrup  Take 5 mL by mouth every six (6) hours for 10 days., Normal, Disp-200 mL, R-0                     CONTINUE these medications which have CHANGED          Details        !! albuterol (PROVENTIL HFA, VENTOLIN HFA, PROAIR HFA) 90 mcg/actuation inhaler  Take 2 Puffs by inhalation every six (6) hours as needed for Wheezing or Shortness of Breath., Normal, Disp-18 g, R-3               !! ipratropium-albuteroL (Combivent Respimat) 20-100 mcg/actuation inhaler  Take 1 Puff by inhalation every six (6) hours., Normal, Disp-4 g, R-3               !! fluticasone propionate (FLONASE) 50 mcg/actuation nasal spray  1 spray by nasal route daily, Normal, Disp-1 Each, R-2               !! - Potential duplicate medications found. Please discuss with provider.              CONTINUE these medications which have NOT CHANGED          Details        hydrOXYzine HCL (ATARAX) 25 mg tablet  Take  by mouth nightly as needed., Historical Med               cyclobenzaprine (FLEXERIL) 5 mg tablet  Take 5 mg by mouth nightly as needed for Muscle Spasm(s)., Historical Med               acetaminophen (Tylenol Extra Strength) 500 mg tablet  Take 1,000 mg by mouth every eight (8) hours as needed for Pain., Historical Med               pregabalin (LYRICA) 200 mg capsule  take 1 capsule by mouth three times a day, Normal, Disp-90 Capsule, R-1               !! albuterol (PROVENTIL HFA, VENTOLIN HFA, PROAIR HFA) 90 mcg/actuation inhaler  Take 2 Puffs by inhalation every six (6) hours as needed., Historical Med                cetirizine (ZYRTEC) 10 mg tablet  Take 10 mg by mouth daily., Historical Med               !! fluticasone propionate (FLONASE) 50 mcg/actuation nasal spray  1 Spray by Nasal route daily., Historical Med               !! Combivent Respimat 20-100 mcg/actuation inhaler  INHALE 1 PUFF BY MOUTH INTO LUNGS EVERY 6 HOURS, Historical Med, DAW               lamoTRIgine (LaMICtal) 25 mg tablet  TAKE 3 TABLETS BY MOUTH ONCE DAILY, Historical Med               metoprolol succinate (TOPROL-XL) 25 mg XL tablet  Historical Med               omeprazole (PRILOSEC) 40 mg capsule  Take 40 mg by mouth daily., Historical Med               pravastatin (PRAVACHOL) 40 mg tablet  Take 40 mg by mouth nightly.  , Historical Med  warfarin (COUMADIN) 2.5 mg tablet  TAKE 2 TABLETS BY MOUTH ONCE DAILY ON MONDAY WEDNESDAY FRIDAY AND TAKE 1 & 1 2 (ONE AND ONE HALF) ALL OTHER DAYS, Historical Med               !! - Potential duplicate medications found. Please discuss with provider.              STOP taking these medications                  doxycycline (MONODOX) 100 mg capsule  Comments:    Reason for Stopping:                      dexAMETHasone 20 mg tab  Comments:    Reason for Stopping:                      dexAMETHasone (Decadron) 6 mg tablet  Comments:    Reason for Stopping:                      enoxaparin (Lovenox) 40 mg/0.4 mL  Comments:    Reason for Stopping:                      enoxaparin (Lovenox) 60 mg/0.6 mL injection  Comments:    Reason for Stopping:                      furosemide (LASIX) 40 mg tablet  Comments:    Reason for Stopping:                             Disposition: home      Consultants:     Pulmonary    Infectious disease      Brief Hospital Course (including pertinent history and physical findings)      Mary [name]??is a 54 y.o.??female??with bioprosthetic mitral valve replacement/Maze procedure/LAA appendage ligation in 2017, Afib(on warfarin bridged with Lovenox), HTN, Smoker,??Bipolar 1  disorder/PTSD,??stable angina pectoris (s/p LHC)??now  admitted with??acute hypoxic respiratory failure in the setting of COVID-19.   ??   ??Patient stated that her symptoms started one day prior to admission and that she noted a fever of 101.69F on that day as well.    ??   Of note patient is not vaccinated against COVID-19, and has never had COVID-19 infection in the past.   ??   She was admitted to the hospitalist service due to her need for oxygen via NC.  She was started on Decadron 30m IV daily.  Infectious disease was consulted.  Her COVID-19 labs were trended during hospitalized and remained within normal limits.  She  did not have a CTA or PVL's to rule out PE or DVT as she had negative d-dimer throughout hospitalization.  On HD2, she was weaned from oxygen however required oxygen again that afternoon and stated she felt worse, having worsening cough and more fatigue.   She had multiple fevers that day.  Her procalcitonin and labs continued to be normal.  A repeat CXR showed no change from admission.  She was started on Doxycycline by ID due to concern for atypical pneumonia.  She was started on scheduled Tylenol  and Motrin which made patient more comfortable overnight.  She was watched an additional day while on 2-3L NC.  On 12/23, Her morning labs revealed an increase  in Cr to 1.6 showing an AKI, patient admits to decreased PO intake the previous day due to  fatigue.  She was started on mIVF after a 533m LR bolus.  [facility] and continued to improve and feel well.  That afternoon, patient was found hypotensive and bradycardic in the restroom while having a bowel movement, she  was transferred to bed.  EKG was normal, non-ischemic.  Her repeat lab work was stable.  She received another 5026mNS bolus and continued mIVF.  She was watched closely overnight. Patient received 15L high flow oxygen, and was incrased to up to 50L  for 3 days. She was diligently using her incentive spirometer  and was weaned from 50L-30L-15L-1.5LNC. On day of discharge she was tested with a walk trial and  her blood pressure was improved and normotensive.   She wished to be discharged home. Her  oxygen was delivered to the hospital and she was discharged home with 3L.  [facility].        Upon admission, patient's INR was subtherapeutic after warfarin was held prior to her heart catheterization, she was taking Lovenox as a bridge.  Her Lovenox was continued BID at a therapeutic dose.  She was continued on her warfarin dose and INR was  checked daily until it was at goal 2-3. It was 2.1, on the day of discharge.  She was discharged on her normal warfarin dosing of 5 mg schedule and her Lovenox was discontinued.     ??   She was counseled extensively on quarantine and isolation in her home, recommending she remain isolated in her room and wear a mask when she is ambulating to restroom or kitchen.  She should remain isolated for 10 days after symptom onset.     ??   She will receive a total of 7 days of Doxycycline and 10 days of Decadron. 10 mg q24hrs.  She did not receive remdesivir during this hospitalization. Of note, Dr. MaTommie Ardorm ID placed her on baricitnib which was not continued on discharge.         CURRENT ADMISSION IMAGING RESULTS       XR CHEST PORT      Result Date: 05/12/2020   1.  Multifocal groundglass opacities which are mildly progressed. Follow-up to resolution is recommended       XR CHEST PORT      Result Date: 05/10/2020   Mild interstitial edema with small effusions and bibasilar atelectasis, increased from the comparison exam.       XR CHEST PORT      Result Date: 05/06/2020   No significant interval change. Hypoventilatory changes.      XR CHEST PORT      Result Date: 05/04/2020   Increasing streaky densities at the bases likely increasing small pleural effusions.      XR CHEST PORT      Result Date: 05/03/2020   Stable enlarged  cardiac silhouette. Question hazy basilar opacities. Question small pleural effusion. Findings may reflect developing edema and/or pneumonitis. Correlate clinically. Follow-up can be obtained.            Cardiology Procedures/Testing:      MODALITY  RESULTS        EKG  Results for orders placed or performed during the hospital encounter of 05/03/20      EKG, 12 LEAD, INITIAL      Result  Value  Ref Range        Ventricular Rate  65  BPM        Atrial Rate  65  BPM        P-R Interval  156  ms        QRS Duration  84  ms        Q-T Interval  398  ms        QTC Calculation (Bezet)  413  ms        Calculated P Axis  92  degrees        Calculated R Axis  44  degrees        Calculated T Axis  75  degrees        Diagnosis              Normal sinus rhythm   Nonspecific ST and T wave abnormality   Abnormal ECG   When compared with ECG of 03-May-2020 09:29,   ST no longer depressed in Inferior leads   T wave inversion less evident in Lateral leads   Confirmed by Lutricia Feil, MD, Marc [phone]) on 05/08/2020 2:26:48 PM                     ECHO  05/03/20      ECHO ADULT FOLLOW-UP OR LIMITED 05/04/2020 05/04/2020      Interpretation Summary   ?  COVID +   ?  Contrast used: Definity.   ?  Technical qualifiers: Echo study was technically difficult with poor endocardial visualization and limited due to patient's condition.   ?  Left??Ventricle: Left ventricle size is normal. Mildly increased wall thickness. See diagram for wall motion findings. EF by visual approximation is 50%.   ?  Right??Ventricle: Not well visualized.   ?  Aortic??Valve: Mild transvalvular regurgitation.   ?  Mitral??Valve: Bioprosthetic valve. Elevated prosthetic gradient. MV mean gradient is 9 mmHg.   ?  Pulmonary??Arteries: Pulmonary hypertension not present. The estimated pulmonary artery systolic pressure is 27 mmHg.      Signed by: Leland Her, MD on 05/04/2020  9:23 PM        Nuclear Medicine  No results found for this or any previous visit.        IR  No  results found for this or any previous visit.           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   Elevated LVEDP.   Recommend intense risk factor modification.      Signed by: Fredirick Maudlin, MD on 04/27/2020 10:52 AM           Special Testing/Procedures:      MODALITY  RESULTS        MICRO  All Micro Results         Procedure  Component  Value  Units  Date/Time        CULTURE, URINE [[id number]]  Collected: 05/13/20 0500        Order Status: Completed  Specimen: Urine from Clean catch  Updated: 05/14/20 1935          Special Requests:  NO SPECIAL REQUESTS               Culture result:  No growth (<1,000 CFU/ML)  CULTURE, URINE [[id number]]          Order Status: Canceled  Specimen: Urine from Clean catch          CULTURE, BLOOD [[id number]]          Order Status: Canceled  Specimen: Blood          CULTURE, RESPIRATORY/SPUTUM/BRONCH Verdie Drown [[id number]]  Collected: 05/06/20 1300        Order Status: Canceled  Specimen: Sputum          RESPIRATORY VIRUS PANEL W/COVID-19, PCR [[id number]]  (Abnormal)  Collected: 05/03/20 1010        Order Status: Completed  Specimen: Nasopharyngeal  Updated: 05/03/20 1116          Adenovirus  Not detected               Coronavirus 229E  Not detected               Coronavirus HKU1  Not detected               Coronavirus CVNL63  Not detected               Coronavirus OC43  Not detected               SARS-CoV-2, PCR  Detected                 Comment:  NOTIFIED DAWN RN [place] [facility] 05/03/20 BY VMB                Metapneumovirus  Not detected               Rhinovirus and Enterovirus  Not detected               Influenza A  Not detected               Influenza B  Not detected               Parainfluenza 1  Not detected               Parainfluenza 2  Not detected               Parainfluenza 3  Not detected               Parainfluenza virus 4  Not detected               RSV  by PCR  Not detected               B. parapertussis, PCR  Not detected               Bordetella pertussis - PCR  Not detected               Chlamydophila pneumoniae DNA, QL, PCR  Not detected               Mycoplasma pneumoniae DNA, QL, PCR  Not detected                               ABG  Lab Results      Component  Value  Date/Time        pH (POC)  7.48 (H)  05/08/2020 04:35 AM        pCO2 (POC)  26.9 (L)  05/08/2020 04:35 AM        pO2 (POC)  64 (L)  05/08/2020 04:35 AM        HCO3 (POC)  19.9 (L)  05/08/2020 04:35 AM        FIO2 (POC)  32  05/03/2020 09:26 PM                 UA  No results found for this or any previous visit.     ENDO  _0 @    _1 @         PATH          Laboratory Results:      LABORATORY  RESULTS        HEMATOLOGY  Lab Results      Component  Value  Date/Time        WBC  11.0  05/15/2020 03:51 AM        HGB  10.9 (L)  05/15/2020 03:51 AM        HCT  33.8 (L)  05/15/2020 03:51 AM        PLATELET  414  05/15/2020 03:51 AM        MCV  90.4  05/15/2020 03:51 AM                  CHEMISTRIES  Lab Results      Component  Value  Date/Time        Sodium  142  05/15/2020 03:51 AM        Potassium  4.6  05/15/2020 03:51 AM        Chloride  110  05/15/2020 03:51 AM        CO2  27  05/15/2020 03:51 AM        Anion gap  5  05/15/2020 03:51 AM        Glucose  115 (H)  05/15/2020 03:51 AM        BUN  39 (H)  05/15/2020 03:51 AM        Creatinine  0.94  05/15/2020 03:51 AM        BUN/Creatinine ratio  41 (H)  05/15/2020 03:51 AM        GFR est AA  >60  05/15/2020 03:51 AM        GFR est non-AA  >60  05/15/2020 03:51 AM        Calcium  8.7  05/15/2020 03:51 AM                 HEPATIC FUNCTION  Lab Results      Component  Value  Date/Time        Albumin  4.2  05/03/2020 09:40 AM        Bilirubin, total  0.6  05/03/2020 09:40 AM        Protein, total  7.3  05/03/2020 09:40 AM        Globulin  3.1  05/03/2020 09:40 AM        A-G Ratio  1.4  05/03/2020 09:40 AM        ALT (SGPT)  32  05/03/2020 09:40 AM         Alk. phosphatase  55  05/03/2020 09:40 AM               LACTIC ACID  Lab Results      Component  Value  Date/Time        Lactic acid  1.1  05/07/2020 03:30 PM              CARDIAC PANEL  Lab Results  Component  Value  Date/Time        CK  211 (H)  04/05/2020 09:22 AM        CK - MB  1.9  04/05/2020 09:22 AM        CK-MB Index  0.9  04/05/2020 09:22 AM        Troponin-I, QT  <0.02  04/05/2020 12:38 PM                 NT-proBNP  Lab Results      Component  Value  Date/Time        NT pro-BNP  1,579 (H)  05/03/2020 09:40 AM        NT pro-BNP  506  04/05/2020 09:22 AM        NT pro-BNP  441  12/26/2019 10:50 AM        NT pro-BNP  430  10/29/2019 11:32 AM              THYROID  Lab Results      Component  Value  Date/Time        TSH  1.88  04/22/2020 08:52 AM              LIPID PANEL  No results found for: CHOL, CHOLPOCT, CHOLX, CHLST, CHOLV, HDL, HDLPOC, HDLP, LDL, LDLCPOC, LDLC, DLDLP, VLDLC, VLDL, TGLX, TRIGL, TRIGP, TGLPOCT,  CHHD, CHHDX         RISK CALCULATORS:      SCORE  RESULT        ASCVD  The ASCVD Risk score Mikey Bussing DC Jr., et al., 2013) failed to calculate for the following reasons:     Cannot find a previous HDL lab     Cannot find a previous total cholesterol lab         CHA2DS2-VASc        HAS-BLED          READMISSION RISK SCORE  Low Risk              12 Total Score     3 Patient Length of Stay (>5 days = 3)     9 Pt. Coverage (Medicare=5 , Medicaid, or Self-Pay=4)          Criteria that do not apply:     Has Seen PCP in Last 6 Months (Yes=3, No=0)     Married. Living with Significant Other. Assisted Living. LTAC. SNF. or    Rehab     IP Visits Last 12 Months (1-3=4, 4=9, >4=11)     Charlson Comorbidity Score (Age + Comorbid Conditions)                Functional status and cognitive function:     Ambulates without assistance   Status: alert, cooperative, no distress, appears stated age   Condition at discharge: STABLE   Disposition: home       Diet: adult regular       Code status and advanced  care plan: full         Point of Contact  BULLUCK, JERRICA (Other Relative)   [phone] (Mobile)        Patient Education:  Patient was educated on the following topics prior to discharge:  COVID-19      Follow-up:      Follow-up Information               Follow up With  Specialties  Details  Why  Contact Info  Larae Grooms, MD  Family Medicine  On 05/14/2020  at 2pm for hospital follow up with [place] for [facility] Follow up  [address]   [address]   [place] [address]   [place], [place], [place] [address]   [phone]                 [facility]  DME Services      [place]                  Larae Grooms , MD, PGY-1     [facility] Family Medicine     Intern Pager: [phone]       December 31 202112:48 PM

## 2020-05-15 NOTE — Progress Notes (Signed)
05/15/20 0030   Oxygen Therapy   O2 Sat (%) 94 %   Pulse via Oximetry 69 beats per minute   O2 Device Hi flow nasal cannula  (SALTER)   Skin Assessment Clean, dry, & intact   O2 Flow Rate (L/min) 7 l/min     Patient placed on 7 Lpm Salter Nasal Cannula. SpO2 91-94% with no complaints of SOB at this time.     Patient educated on oxygen change and weaning process. RT will continue to titrate O2 for SpO2 of 88% or >.    RT will continue to monitor respiratory status.

## 2020-05-15 NOTE — Progress Notes (Signed)
Titrate 2L     05/15/20 0724   Oxygen Therapy   O2 Sat (%) 97 %   Pulse via Oximetry 58 beats per minute   O2 Device Nasal cannula   O2 Flow Rate (L/min) 2 l/min   Pre-Treatment   Breathing Pattern Regular   Breath Sounds Bilateral Diminished   Pulse 56   SpO2 96 %   Respirations 20   Post-Treatment   Breathing Pattern Regular   Breath Sounds Bilateral Diminished   Pulse 56   SpO2 97 %   Respirations 16   Treatment Tolerance Well   Procedures   $$ Subsequent Procedure Aerosol   Delivery Source Mask   Aerosolized Medications DuoNeb;Pulmicort    NC

## 2020-05-15 NOTE — Progress Notes (Signed)
 Discharge planning    Home oxygen orders and clinicals faxed via [employer] and walk test faxed manually to [facility] at [phone] and [phone].    Spoke with [place] with [employer].     Spoke with patient concerning home oxygen. Explain to patient that she will need home oxygen when transported  home. Patient stated  I can have someone to pick me up.    Plan is for discharge today once home oxygen is processed and delivery confirmed.    LOIS Sauers, BSN, RN  Pager # [phone]  Care Manager
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
## 2020-05-15 NOTE — Progress Notes (Signed)
Problem: Falls - Risk of  Goal: *Absence of Falls  Description: Document Bridgette HabermannSchmid Fall Risk and appropriate interventions in the flowsheet.  Outcome: Progressing Towards Goal  Note: Fall Risk Interventions:  Mobility Interventions: Bed/chair exit alarm         Medication Interventions: Bed/chair exit alarm,Teach patient to arise slowly,Patient to call before getting OOB    Elimination Interventions: Bed/chair exit alarm              Problem: Patient Education: Go to Patient Education Activity  Goal: Patient/Family Education  Outcome: Progressing Towards Goal     Problem: Pain  Goal: *Control of Pain  Outcome: Progressing Towards Goal  Goal: *PALLIATIVE CARE:  Alleviation of Pain  Outcome: Progressing Towards Goal     Problem: Patient Education: Go to Patient Education Activity  Goal: Patient/Family Education  Outcome: Progressing Towards Goal     Problem: Activity Intolerance  Goal: *Oxygen saturation during activity within specified parameters  Outcome: Progressing Towards Goal  Goal: *Able to remain out of bed as prescribed  Outcome: Progressing Towards Goal     Problem: Patient Education: Go to Patient Education Activity  Goal: Patient/Family Education  Outcome: Progressing Towards Goal

## 2020-05-15 NOTE — Progress Notes (Signed)
1910: Bedside shift change report given to Ugonan, RN(oncoming nurse) by Shearon Baloavia, RN (offgoing nurse). Report included the following information SBAR, Kardex, Intake/Output, MAR and Cardiac Rhythm NSR.     Bedside shift change report given to Shearon Baloavia, RN (oncoming nurse) by Vela ProseUgonna, RN (offgoing nurse). Report included the following information SBAR, Kardex, Intake/Output, MAR and Cardiac Rhythm NSR.

## 2020-05-15 NOTE — Progress Notes (Signed)
Problem: Falls - Risk of  Goal: *Absence of Falls  Description: Document Bridgette Habermann Fall Risk and appropriate interventions in the flowsheet.  Outcome: Progressing Towards Goal  Note: Fall Risk Interventions:  Mobility Interventions: Bed/chair exit alarm         Medication Interventions: Bed/chair exit alarm,Patient to call before getting OOB,Teach patient to arise slowly    Elimination Interventions: Bed/chair exit alarm,Call light in reach,Patient to call for help with toileting needs,Toilet paper/wipes in reach,Toileting schedule/hourly rounds              Problem: Patient Education: Go to Patient Education Activity  Goal: Patient/Family Education  Outcome: Progressing Towards Goal     Problem: Pain  Goal: *Control of Pain  Outcome: Progressing Towards Goal  Goal: *PALLIATIVE CARE:  Alleviation of Pain  Outcome: Progressing Towards Goal     Problem: Patient Education: Go to Patient Education Activity  Goal: Patient/Family Education  Outcome: Progressing Towards Goal     Problem: Activity Intolerance  Goal: *Oxygen saturation during activity within specified parameters  Outcome: Progressing Towards Goal  Goal: *Able to remain out of bed as prescribed  Outcome: Progressing Towards Goal     Problem: Patient Education: Go to Patient Education Activity  Goal: Patient/Family Education  Outcome: Progressing Towards Goal

## 2020-05-15 NOTE — Progress Notes (Signed)
Progress Notes by Einar PheasantMcNulty, Khameron Gruenwald, MD at 05/15/20 0800                Author: Einar PheasantMcNulty, Luzelena Heeg, MD  Service: FAMILY MEDICINE  Author Type: Resident       Filed: 05/15/20 0831  Date of Service: 05/15/20 0800  Status: Attested           Editor: Einar PheasantMcNulty, Kellyn Mansfield, MD (Resident)  Cosigner: Lurlean LeydenBritton, Bruce, MD at 05/15/20 1433          Attestation signed by Lurlean LeydenBritton, Bruce, MD at 05/15/20 1433                  See resident's note for details. above.  I have personally seen and evaluated this patient.  I have discussed this patient with resident and team.  I have reviewed resident note and  agree with above.  Tolerating home O2 levels.  No smoking.  Info on NRT provided.  Discussed with ID.  OK for home.                                             [facility]   Intern Progress Note         Patient: Mary HausenKaren [name]  MRN: [id number]        SSN: [id number]   Date of Birth: 1965-12-24        Age: 54 y.o.   Sex: female         Admit Date: 05/03/2020     LOS: 12 days      Chief Complaint       Patient presents with        ?  Shortness of Breath     ?  Chest Pain     ?  Cough        ?  Fever             Subjective:        Patient was seen at bedside this morning and had no complaints or concerns. States she is eating very well and still currently on 1.5 L NC   ??   ROS :    Denies chest pain, shortness of breath, abdominal pain      Objective:        Visit Vitals      BP  132/64 (BP 1 Location: Right upper arm, BP Patient Position: At rest)     Pulse  69     Temp  97.7 ??F (36.5 ??C)     Resp  19     Ht  5\' 3"  (1.6 m)     Wt  88.4 kg (194 lb 12.8 oz)     SpO2  97%     Breastfeeding  No        BMI  34.51 kg/m??           Physical Exam:    General:?? NAD, AOx3, 25L hi flow   CV: ??RRR, no M/G/R   RESP:??Unlabored breathing.??CTAB. Equal expansion bilaterally.    ABD:????Soft, nontender, nondistended.    Ext:????No edema, erythema, or tenderness.??   Skin: Warm &??dry. No rashes, lesions, or ulcers. ??Good turgor.     Psych:??Appropriate??mood and affect.      Intake and Output:   Current Shift: No intake/output data recorded.   Last three shifts: 12/29 1901 - 12/31 0700   In:  250 [P.O.:250]   Out: 1450 [Urine:1450]      Lab/Data Review:     Recent Results (from the past 12 hour(s))     METABOLIC PANEL, BASIC          Collection Time: 05/15/20  3:51 AM         Result  Value  Ref Range            Sodium  142  136 - 145 mmol/L       Potassium  4.6  3.5 - 5.5 mmol/L       Chloride  110  100 - 111 mmol/L       CO2  27  21 - 32 mmol/L       Anion gap  5  3.0 - 18 mmol/L       Glucose  115 (H)  74 - 99 mg/dL       BUN  39 (H)  7.0 - 18 MG/DL       Creatinine  6.96  0.6 - 1.3 MG/DL       BUN/Creatinine ratio  41 (H)  12 - 20         GFR est AA  >60  >60 ml/min/1.56m2       GFR est non-AA  >60  >60 ml/min/1.55m2       Calcium  8.7  8.5 - 10.1 MG/DL       CBC WITH AUTOMATED DIFF          Collection Time: 05/15/20  3:51 AM         Result  Value  Ref Range            WBC  11.0  4.6 - 13.2 K/uL       RBC  3.74 (L)  4.20 - 5.30 M/uL       HGB  10.9 (L)  12.0 - 16.0 g/dL       HCT  29.5 (L)  28.4 - 45.0 %       MCV  90.4  78.0 - 100.0 FL       MCH  29.1  24.0 - 34.0 PG       MCHC  32.2  31.0 - 37.0 g/dL       RDW  13.2  44.0 - 14.5 %       PLATELET  414  135 - 420 K/uL       MPV  9.8  9.2 - 11.8 FL       NRBC  0.0  0 PER 100 WBC       ABSOLUTE NRBC  0.00  0.00 - 0.01 K/uL       NEUTROPHILS  82 (H)  40 - 73 %       LYMPHOCYTES  10 (L)  21 - 52 %       MONOCYTES  6  3 - 10 %       EOSINOPHILS  1  0 - 5 %       BASOPHILS  0  0 - 2 %       METAMYELOCYTES  1  %       IMMATURE GRANULOCYTES  0  0.0 - 0.5 %       ABS. NEUTROPHILS  9.0 (H)  1.8 - 8.0 K/UL       ABS. LYMPHOCYTES  1.1  0.9 - 3.6 K/UL       ABS. MONOCYTES  0.7  0.05 - 1.2 K/UL  ABS. EOSINOPHILS  0.1  0.0 - 0.4 K/UL       ABS. BASOPHILS  0.0  0.0 - 0.1 K/UL       ABS. IMM. GRANS.  0.0  0.00 - 0.04 K/UL       DF  MANUAL          PLATELET COMMENTS  ADEQUATE PLATELETS          RBC COMMENTS   NORMOCYTIC, NORMOCHROMIC          PROCALCITONIN          Collection Time: 05/15/20  3:51 AM         Result  Value  Ref Range            Procalcitonin  <0.05  ng/mL       C REACTIVE PROTEIN, QT          Collection Time: 05/15/20  3:51 AM         Result  Value  Ref Range            C-Reactive protein  <0.3  0 - 0.3 mg/dL       D DIMER          Collection Time: 05/15/20  3:51 AM         Result  Value  Ref Range            D DIMER  0.68 (H)  <0.46 ug/ml(FEU)       FERRITIN          Collection Time: 05/15/20  3:51 AM         Result  Value  Ref Range            Ferritin  793 (H)  8 - 388 NG/ML       PROTHROMBIN TIME + INR          Collection Time: 05/15/20  3:51 AM         Result  Value  Ref Range            Prothrombin time  27.2 (H)  11.5 - 15.2 sec            INR  2.6 (H)  0.8 - 1.2                     Assessment and Plan:        This is a 54 y.o.??female??with PMH 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??Vasovagal event??on 12/23??during BM which delayed discharge, but??her VS are improved  and have been stable.????   ??   Acute Hypoxic Respiratory failure 2/2 COVID-19 PNA:    Improving, fevers improved.??Breathing and cough improving. ??Doxycycline??started for thought of an atypical pneumonia. CXR??12/26??Mild interstitial edema with small effusions and bibasilar atelectasis, increased from the comparison exam.??   PLAN:????   -??continue nasal cannula 1.5 Nc - wean as tolerated   - PT/OT/CM   - Infectious disease following, appreciate recommendations    - Trend CBC, BMP, CRP, D-dimer, Ferritin, Procal   - Continue dexamethasone IV??10??mg q12 hrs??12/27 - 12/31as per ID??   - Contact precautions    - Continue home inhalers at this time    - Quarantine for 10 days after symptom onset/positive test    -Continue baricitnib   ??   pAfib currently NSR   -  INR??2>2.3>3.5>4>3.6   - Pharmacy recommendations reviewed, appreciate assistance   PLAN:??   - Hold Metoprolol, restart at outpatient follow up   -Warfarin held 12/27 - INR subtherapeutic of 4   -Pharmacy will recheck INR daily and adjust dose accordingly. Will continue to follow INR trend and patient's clinical progress daily.   ??   ARDS   Initially mild with initial P/F ratio was 237, however pt decompensated, now on HFNC, with??with elevated FiO2   -??pt having worsening ARDS despite receiving 11 days of steroids.    -Dose has been increased to ARDS dosing, which may prove beneficial however there is no reliable data. ??   -Dr. Allena Katz would recommend no more than 5 days of higher dose decadron??be given??since the??risk for complications will increase significantly with prolonged use --??most concerning??risk??is opportunistic infection (especially with baricitinib??use)    - duonebs q4h, pulmicort nebs 1mg  BID, and albuterol PRN   ??   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 ??BNP 1579,??negative high-sensitivity troponin x 2   ????????????????????????- Hold Lasix 40 mg, restart at outpatient follow up   ????????????????????????- TTE results reviewed    ??   Tobacco dependence/questionable COPD:   Patient with 40-pack-year smoking history. ??Current every day smoker.   ????????????????????????- Continue home inhalers   ????????????????????????- No nebulizer treatments in the setting of COVID-19 infection   ????????????????????????- Nicotine patch if needed, encourage cessation of tobacco use on discharge??and follow-up   ??   Bipolar 1 disorder/PTSD:   Written for Lamotrigine 75 mg daily by Psychiatrist but patient states taking 50mg  at 75mg  is too high, causes sedation and confusion   ????????????????????????- Continue Lamotrigine 50mg  daily   ??   AKI - resolved   Cr -  0.69>0.76>0.69>0.71>0.75>1.02>0.94   ????????????????????????- Encourage PO intake   ??      Diet  ??Cardiac diet     DVT Prophylaxis  ??Lovenox 90 mg twice daily     GI Prophylaxis  ??Protonix 40 mg daily     Code status  ??Full     Disposition  ??Home     ??      Point of Contact  Jerrica Bulluck??   Relationship:??God-Daughter   [phone]     ??     Apolonio Schneiders MD, PGY-1     [facility]     Intern Pager: [phone]       May 15, 2020, 8:01 AM

## 2020-05-15 NOTE — Progress Notes (Signed)
05/15/20 0238   Oxygen Therapy   O2 Sat (%) 95 %   Pulse via Oximetry 58 beats per minute   O2 Device Hi flow nasal cannula  (SALTER)   O2 Flow Rate (L/min) 7 l/min   Patient is resting with no signs of respiratory distress noted. SpO2 remains stable on 7L [place] NC.

## 2020-05-15 NOTE — Progress Notes (Signed)
Infectious Disease progress Note        Reason: COVID-19 pneumonia    Current abx Prior abx   Doxycycline since 05/06/20-12/29      Lines:       Assessment :  54 y.o. 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    Malaise for about one week  S/p cardiac cath about a week ago    Clinical presentation consistent with acute hypoxia-present on admission due to COVID-19 pneumonia       Persistent hypoxia likely due to covid-19 infection superimposed on underlying emphysema    Increasing oxygen requirement-likely due to progressive COVID-19 pneumonia- currently on 30 L high flow oxygen    Worsening hypoxia, worsening CRP on 05/11/2020-high risk for clinical deterioration.  Hence switched to high dose decadron, baricitinib    Low procalcitonin argues against superimposed bacterial infection.      Improved inflammatory markers.    Clinically better. Appeared comfortable on high flow oxygen at 1.5L    Recommendations:    1.  Switch decadron to 10 mg po q 24 hour till 05/21/19.  D/c baricitinib  2.   continue Robitussin-DM.   3. Continue diuretics per primary team  4.  Continue anticoagulation per primary team/pulmonary  5.  Monitor CRP, D-dimer, procalcitonin  6.  Recommend to check O2 saturation on room air on ambulation,home O2 eval  7. D/c planning per primary team       Above plan was discussed in details with patient, dr. Micheline Rough, RN Please call me if any further questions or concerns. Will continue to participate in the care of this patient.    HPI:  States that she feels better.  Wants to go home.    Denies pleuritic chest pain, abdominal pain, diarrhea, nausea.    Past Medical History:   Diagnosis Date   ??? Anxiety    ??? Bipolar 1 disorder (HCC)    ??? CHF (congestive heart failure)  (HCC)    ??? COPD (chronic obstructive pulmonary disease) (HCC)    ??? COPD (chronic obstructive pulmonary disease) (HCC)    ??? H/O aortic valve replacement    ??? PTSD (post-traumatic stress disorder)        Past Surgical History:   Procedure Laterality Date   ??? HX HEART CATHETERIZATION     ??? HX HEART VALVE SURGERY         home Medication List    Details   hydrOXYzine HCL (ATARAX) 25 mg tablet Take  by mouth nightly as needed.      enoxaparin (Lovenox) 40 mg/0.4 mL 60 mg by SubCUTAneous route every twelve (12) hours every twelve (12) hours.      cyclobenzaprine (FLEXERIL) 5 mg tablet Take 5 mg by mouth nightly as needed for Muscle Spasm(s).      acetaminophen (Tylenol Extra Strength) 500 mg tablet Take 1,000 mg by mouth every eight (8) hours as needed for Pain.      pregabalin (LYRICA) 200 mg capsule take 1 capsule by mouth three times a day  Qty: 90 Capsule, Refills: 1    Associated Diagnoses: Neck pain; Chronic pain syndrome      albuterol (PROVENTIL HFA, VENTOLIN HFA, PROAIR HFA) 90 mcg/actuation inhaler Take 2 Puffs by inhalation every six (6) hours as needed.      cetirizine (ZYRTEC) 10  mg tablet Take 10 mg by mouth daily.      fluticasone propionate (FLONASE) 50 mcg/actuation nasal spray 1 Spray by Nasal route daily.      furosemide (LASIX) 40 mg tablet TAKE ONE TABLET BY MOUTH EVERY DAY      Combivent Respimat 20-100 mcg/actuation inhaler INHALE 1 PUFF BY MOUTH INTO LUNGS EVERY 6 HOURS      lamoTRIgine (LaMICtal) 25 mg tablet TAKE 3 TABLETS BY MOUTH ONCE DAILY      metoprolol succinate (TOPROL-XL) 25 mg XL tablet       omeprazole (PRILOSEC) 40 mg capsule Take 40 mg by mouth daily.      pravastatin (PRAVACHOL) 40 mg tablet Take 40 mg by mouth nightly.        warfarin (COUMADIN) 2.5 mg tablet TAKE 2 TABLETS BY MOUTH ONCE DAILY ON MONDAY WEDNESDAY FRIDAY AND TAKE 1 & 1 2 (ONE AND ONE HALF) ALL OTHER DAYS             Current Facility-Administered Medications   Medication Dose Route Frequency   ??? [START ON 05/16/2020]  dexamethasone (DECADRON) 4 mg/mL injection 10 mg  10 mg IntraVENous DAILY   ??? warfarin (COUMADIN) tablet 3 mg  3 mg Oral ONCE   ??? albuterol-ipratropium (DUO-NEB) 2.5 MG-0.5 MG/3 ML  3 mL Nebulization Q6H RT   ??? budesonide (PULMICORT) 1,000 mcg/2 mL nebulizer susp  1,000 mcg Nebulization BID RT   ??? lidocaine 4 % patch 1 Patch  1 Patch TransDERmal DAILY PRN   ??? dexamethasone (DECADRON) 4 mg/mL injection 10 mg  10 mg IntraVENous Q12H   ??? baricitinib (OLUMIANT) tablet 4 mg  4 mg Oral DAILY   ??? ibuprofen (MOTRIN) tablet 600 mg  600 mg Oral Q6H   ??? guaiFENesin-dextromethorphan (ROBITUSSIN DM) 100-10 mg/5 mL syrup 5 mL  5 mL Oral Q6H   ??? lamoTRIgine (LaMICtal) tablet 50 mg  50 mg Oral DAILY   ??? acetaminophen (TYLENOL) tablet 1,000 mg  1,000 mg Oral Q8H   ??? sodium chloride (NS) flush 5-40 mL  5-40 mL IntraVENous Q8H   ??? sodium chloride (NS) flush 5-40 mL  5-40 mL IntraVENous PRN   ??? ondansetron (ZOFRAN ODT) tablet 4 mg  4 mg Oral Q8H PRN    Or   ??? ondansetron (ZOFRAN) injection 4 mg  4 mg IntraVENous Q6H PRN   ??? cholecalciferol (VITAMIN D3) (1000 Units /25 mcg) tablet 2,000 Units  2,000 Units Oral DAILY   ??? cetirizine (ZYRTEC) tablet 10 mg  10 mg Oral DAILY   ??? fluticasone propionate (FLONASE) 50 mcg/actuation nasal spray 2 Spray  2 Spray Both Nostrils DAILY   ??? [Held by provider] furosemide (LASIX) tablet 40 mg  40 mg Oral DAILY   ??? hydrOXYzine HCL (ATARAX) tablet 25 mg  25 mg Oral TID PRN   ??? pantoprazole (PROTONIX) tablet 40 mg  40 mg Oral DAILY   ??? pravastatin (PRAVACHOL) tablet 40 mg  40 mg Oral QHS   ??? pregabalin (LYRICA) capsule 200 mg  200 mg Oral TID   ??? [Held by provider] metoprolol succinate (TOPROL-XL) XL tablet 25 mg  25 mg Oral DAILY   ??? albuterol (PROVENTIL HFA, VENTOLIN HFA, PROAIR HFA) inhaler 2 Puff  2 Puff Inhalation Q6H PRN   ??? Warfarin - Pharmacy to Dose   Other Rx Dosing/Monitoring       Allergies: Adhesive and Lithium    Family History   Problem Relation Age of Onset   ??? No  Known Problems Mother       Social History     Socioeconomic History   ??? Marital status: DIVORCED     Spouse name: Not on file   ??? Number of children: Not on file   ??? Years of education: Not on file   ??? Highest education level: Not on file   Occupational History   ??? Not on file   Tobacco Use   ??? Smoking status: Current Every Day Smoker     Packs/day: 1.00   ??? Smokeless tobacco: Never Used   Substance and Sexual Activity   ??? Alcohol use: Never   ??? Drug use: Yes     Types: Marijuana   ??? Sexual activity: Not on file   Other Topics Concern   ??? Not on file   Social History Narrative   ??? Not on file     Social Determinants of Health     Financial Resource Strain:    ??? Difficulty of Paying Living Expenses: Not on file   Food Insecurity:    ??? Worried About Running Out of Food in the Last Year: Not on file   ??? Ran Out of Food in the Last Year: Not on file   Transportation Needs:    ??? Lack of Transportation (Medical): Not on file   ??? Lack of Transportation (Non-Medical): Not on file   Physical Activity:    ??? Days of Exercise per Week: Not on file   ??? Minutes of Exercise per Session: Not on file   Stress:    ??? Feeling of Stress : Not on file   Social Connections:    ??? Frequency of Communication with Friends and Family: Not on file   ??? Frequency of Social Gatherings with Friends and Family: Not on file   ??? Attends Religious Services: Not on file   ??? Active Member of Clubs or Organizations: Not on file   ??? Attends BankerClub or Organization Meetings: Not on file   ??? Marital Status: Not on file   Intimate Partner Violence:    ??? Fear of Current or Ex-Partner: Not on file   ??? Emotionally Abused: Not on file   ??? Physically Abused: Not on file   ??? Sexually Abused: Not on file   Housing Stability:    ??? Unable to Pay for Housing in the Last Year: Not on file   ??? Number of Places Lived in the Last Year: Not on file   ??? Unstable Housing in the Last Year: Not on file     Social History     Tobacco Use   Smoking Status Current Every Day Smoker   ??? Packs/day: 1.00    Smokeless Tobacco Never Used        Temp (24hrs), Avg:98 ??F (36.7 ??C), Min:97.7 ??F (36.5 ??C), Max:98.2 ??F (36.8 ??C)    Visit Vitals  BP 133/61 (BP 1 Location: Right upper arm, BP Patient Position: At rest)   Pulse 70   Temp 98.2 ??F (36.8 ??C)   Resp 20   Ht 5\' 3"  (1.6 m)   Wt 88.4 kg (194 lb 12.8 oz)   SpO2 91%   Breastfeeding No   BMI 34.51 kg/m??       ROS: 12 point ROS obtained in details. Pertinent positives as mentioned in HPI,   otherwise negative    Physical Exam:    Vitals signs and nursing note reviewed.   Constitutional:    Sitting on bed.  Alert  awake oriented x3, appears more comfortable than prior exam  HENT:      Head: Normocephalic.   Eyes:      Conjunctiva/sclera: Conjunctivae normal.      Neck:      Musculoskeletal: Normal range of motion and neck supple.   Cardiovascular:      Rate and Rhythm: Normal rate and regular rhythm on monitor  Chest:      Bilateral chest movements equal.  Auscultation deferred due to Covid positive  Abdominal:      General: There is no distension.      Palpations: Abdomen is soft.      Tenderness: There is no abdominal tenderness. There is no rebound.   Musculoskeletal: Normal range of motion.         General: No tenderness.   Skin:     General: Skin is warm and dry.      Findings: No rash.   Neurological:      Mental Status:  alert and oriented to person, place, and time.      Cranial Nerves: No cranial nerve deficit.      Motor: No abnormal muscle tone.      Coordination: Coordination normal.   Psychiatric:         Behavior: Behavior normal.         Thought Content: Thought content normal.         Judgment: Judgment normal.     Labs: Results:   Chemistry Recent Labs     05/15/20  0351 05/14/20  0313 05/13/20  0449   GLU 115* 109* 128*   NA 142 141 140   K 4.6 4.8 4.1   CL 110 110 110   CO2 27 26 24    BUN 39* 33* 20*   CREA 0.94 1.02 0.75   CA 8.7 8.7 9.3   AGAP 5 5 6    BUCR 41* 32* 27*      CBC w/Diff Recent Labs     05/15/20  0351 05/14/20  0313 05/13/20  0449   WBC  11.0 11.3 10.5   RBC 3.74* 3.53* 3.73*   HGB 10.9* 10.4* 10.6*   HCT 33.8* 32.3* 33.8*   PLT 414 359 318   GRANS 82* 78* 79*   LYMPH 10* 11* 16*   EOS 1 0 0      Microbiology Recent Labs     05/13/20  0500   CULT No growth (<1,000 CFU/ML)          RADIOLOGY:    All available imaging studies/reports in connect care for this admission were reviewed      Disclaimer: Sections of this note are dictated utilizing voice recognition software, which may have resulted in some phonetic based errors in grammar and contents. Even though attempts were made to correct all the mistakes, some may have been missed, and remained in the body of the document. If questions arise, please contact our department.    Dr. Raiford SimmondsManali Darrold Bezek, Infectious Disease Specialist  [phone]  May 15, 2020  8:54 AM

## 2020-05-15 NOTE — Progress Notes (Signed)
Discharge planning     Per Mary MartinezKita, with [employer] [facility], patient's home oxygen was approved and  delivered to hospital.    Mary MaximK. Mary [name], BSN, RN  Pager # [phone]  Care Manager

## 2020-05-20 ENCOUNTER — Telehealth

## 2020-05-20 NOTE — Telephone Encounter (Signed)
Dr Wilford Corner,    You last saw pt in Sept and ordered a cane and a bone dexa scan at that appt.  Pt has had multiple compression fractures it the past.  I would think we could defer the dexa scan until she is in better physical shape and fully recovered from the covid.  Her social issues may need to be addressed by her PCP??

## 2020-05-20 NOTE — Telephone Encounter (Signed)
Can hold off on DEXA until she is feeling better.    She can call [phone] for [facility]. They can do a services intake assessment done by a Health dept nurse to see what home services she qualifies for at home or if she needs to be placed in a nursing home.    Should contact her PCP for general medical concerns.

## 2020-05-20 NOTE — Telephone Encounter (Signed)
Attempted to contact the pt about her message. She was not able to be reached. A message was left for the pt asking for a return call to the office regarding a message received. The number to the office was provided. No pt names were used in the message for HIPAA protection.

## 2020-05-20 NOTE — Telephone Encounter (Signed)
Patient called very tearful and stressed because she has a bone scan coming up and a follow up appt to discuss.  She reports she was just released from [facility] on Friday after a 13 day stay for covid and pneumonia.  She does not know if she is still supposed to attend bone scan.  She is upset because she has been left to her own devices as far as food and DME for ambulation. Insurance has been telling her a cane is coming for 3 days.  She had to set up meals on wheels for food after not having any for 3 days.  She reports she is on oxygen and cannot walk from her room to the bathroom due to pain.  She is requesting a return call for guidance please.      [phone] Patient

## 2020-05-21 NOTE — Telephone Encounter (Signed)
Pt was contacted and notified of the number to call for extra services. The pt was identified using 2 pt identifiers. The pt verbalized that she would still like to get her bone study done but is not sure if they will let her. I advised the pt to contact them and ask those questions. She will need to make them aware that she is still on home O2 and ask if this will be an issue for the test. They should be able to tell her whether she will be able to use the O2 or need to be off of it and for how long. The pt verbalized understanding and will call them. She is also requesting something for pain until she can be seen. The pt was made aware that her request will be sent to the provider for review. She will be contacted once there is a response from the provider. No further questions or concerns voiced from the pt at this time.

## 2020-05-21 NOTE — Telephone Encounter (Signed)
Patient last seen in Sept 2021.   Does she need a RF of her Flexeril 5mg ?  She can take this along with her Lyrica.    No other RX until she is evaluated, she has had a lot of medical issues.

## 2020-05-21 NOTE — Telephone Encounter (Signed)
 Patient returned a call to the office in regards to the previous message. Patient stated she has just been released from the hospital due to having COVID19 and pneumonia and she's unsure on what to do patient also stated she needs Dr. Voncile to see her asap due to her increased pain, and no one seems to be helping her    She's requesting a call back at [phone]

## 2020-05-22 NOTE — Telephone Encounter (Signed)
I called and spoke to Ms. Altic regarding her message. The pt was identified using 2 pt identifiers. He was asked if she needed a refill on her flexeril. The pt states that she was told by her pcp not to take this medication as much as possible right now because of her current breathing issues. The pt is still on lyrica at 200 mg TID. Ms. Jacobs was advised that at this time there are no other medication options for pain control. The pt verbalized understanding and has no requests or questions at this time.

## 2020-05-25 ENCOUNTER — Telehealth

## 2020-05-25 LAB — PT/INR EXTERNAL: INR, External: 7.6

## 2020-05-25 LAB — AMB PT/INR EXTERNAL: INR, External: 7.6 NA

## 2020-05-25 NOTE — Progress Notes (Signed)
Also please send letter to her home. Hold coumadin

## 2020-05-26 ENCOUNTER — Ambulatory Visit: Payer: MEDICARE | Primary: Family Medicine

## 2020-05-27 ENCOUNTER — Ambulatory Visit: Primary: Family Medicine

## 2020-05-30 ENCOUNTER — Emergency Department: Admit: 2020-05-30 | Payer: MEDICARE | Primary: Family Medicine

## 2020-05-30 ENCOUNTER — Inpatient Hospital Stay
Admit: 2020-05-30 | Discharge: 2020-05-31 | Disposition: A | Discharge status: Home / Self Care | Payer: MEDICARE | Attending: Emergency Medicine

## 2020-05-30 DIAGNOSIS — I82622 Acute embolism and thrombosis of deep veins of left upper extremity: Secondary | ICD-10-CM

## 2020-05-30 LAB — METABOLIC PANEL, COMPREHENSIVE
A-G Ratio: 0.6 — ABNORMAL LOW (ref 0.8–1.7)
ALT (SGPT): 21 U/L (ref 13–56)
AST (SGOT): 16 U/L (ref 10–38)
Albumin: 2.7 g/dL — ABNORMAL LOW (ref 3.4–5.0)
Alk. phosphatase: 77 U/L (ref 45–117)
Anion gap: 7 mmol/L (ref 3.0–18)
BUN/Creatinine ratio: 13 (ref 12–20)
BUN: 11 MG/DL (ref 7.0–18)
Bilirubin, total: 0.5 MG/DL (ref 0.2–1.0)
CO2: 29 mmol/L (ref 21–32)
Calcium: 9.5 MG/DL (ref 8.5–10.1)
Chloride: 102 mmol/L (ref 100–111)
Creatinine: 0.85 MG/DL (ref 0.6–1.3)
GFR est AA: >60 mL/min/{1.73_m2} (ref >60)
GFR est non-AA: >60 mL/min/{1.73_m2} (ref >60)
Globulin: 4.6 g/dL — ABNORMAL HIGH (ref 2.0–4.0)
Glucose: 94 mg/dL (ref 74–99)
Potassium: 3.8 mmol/L (ref 3.5–5.5)
Protein, total: 7.3 g/dL (ref 6.4–8.2)
Sodium: 138 mmol/L (ref 136–145)

## 2020-05-30 LAB — CBC WITH AUTOMATED DIFF
ABS. BASOPHILS: 0.1 10*3/uL (ref 0.0–0.1)
ABS. EOSINOPHILS: 0.4 10*3/uL (ref 0.0–0.4)
ABS. IMM. GRANS.: 0.1 10*3/uL — ABNORMAL HIGH (ref 0.00–0.04)
ABS. LYMPHOCYTES: 2.1 10*3/uL (ref 0.9–3.6)
ABS. MONOCYTES: 0.5 10*3/uL (ref 0.05–1.2)
ABS. NEUTROPHILS: 5.1 10*3/uL (ref 1.8–8.0)
ABSOLUTE NRBC: 0 10*3/uL (ref 0.00–0.01)
BASOPHILS: 1 % (ref 0–2)
EOSINOPHILS: 5 % (ref 0–5)
HCT: 32.7 % — ABNORMAL LOW (ref 35.0–45.0)
HGB: 10.5 g/dL — ABNORMAL LOW (ref 12.0–16.0)
IMMATURE GRANULOCYTES: 1 % — ABNORMAL HIGH (ref 0.0–0.5)
LYMPHOCYTES: 25 % (ref 21–52)
MCH: 29.1 PG (ref 24.0–34.0)
MCHC: 32.1 g/dL (ref 31.0–37.0)
MCV: 90.6 FL (ref 78.0–100.0)
MONOCYTES: 6 % (ref 3–10)
MPV: 9.6 FL (ref 9.2–11.8)
NEUTROPHILS: 63 % (ref 40–73)
NRBC: 0 PER 100 WBC
PLATELET: 371 10*3/uL (ref 135–420)
RBC: 3.61 M/uL — ABNORMAL LOW (ref 4.20–5.30)
RDW: 14.1 % (ref 11.6–14.5)
WBC: 8.2 10*3/uL (ref 4.6–13.2)

## 2020-05-30 LAB — PROTHROMBIN TIME + INR
INR: 1.5 — ABNORMAL HIGH (ref 0.8–1.2)
Prothrombin time: 18.3 s — ABNORMAL HIGH (ref 11.5–15.2)

## 2020-05-30 LAB — TROPONIN-HIGH SENSITIVITY: Troponin-High Sensitivity: 8 ng/L (ref 0–54)

## 2020-05-30 LAB — NT-PRO BNP: NT pro-BNP: 292 PG/ML (ref 0–900)

## 2020-05-30 LAB — COMPREHENSIVE METABOLIC PANEL
ALT: 21 U/L (ref 13–56)
AST: 16 U/L (ref 10–38)
Albumin/Globulin Ratio: 0.6 — ABNORMAL LOW (ref 0.8–1.7)
Albumin: 2.7 g/dL — ABNORMAL LOW (ref 3.4–5.0)
Alkaline Phosphatase: 77 U/L (ref 45–117)
Anion Gap: 7 mmol/L (ref 3.0–18)
BUN/Creatinine Ratio: 13 (ref 12–20)
BUN: 11 MG/DL (ref 7.0–18)
CO2: 29 mmol/L (ref 21–32)
Calcium: 9.5 MG/DL (ref 8.5–10.1)
Chloride: 102 mmol/L (ref 100–111)
Creatinine: 0.85 MG/DL (ref 0.6–1.3)
GFR African American: >60 mL/min/{1.73_m2} (ref >60)
Globulin: 4.6 g/dL — ABNORMAL HIGH (ref 2.0–4.0)
Glucose: 94 mg/dL (ref 74–99)
Potassium: 3.8 mmol/L (ref 3.5–5.5)
Sodium: 138 mmol/L (ref 136–145)
Total Bilirubin: 0.5 MG/DL (ref 0.2–1.0)
Total Protein: 7.3 g/dL (ref 6.4–8.2)
eGFR NON-AA: >60 mL/min/{1.73_m2} (ref >60)

## 2020-05-30 LAB — CBC WITH AUTO DIFFERENTIAL
Basophils %: 1 % (ref 0–2)
Basophils Absolute: 0.1 10*3/uL (ref 0.0–0.1)
Eosinophils %: 5 % (ref 0–5)
Eosinophils Absolute: 0.4 10*3/uL (ref 0.0–0.4)
Granulocyte Absolute Count: 0.1 10*3/uL — ABNORMAL HIGH (ref 0.00–0.04)
Hematocrit: 32.7 % — ABNORMAL LOW (ref 35.0–45.0)
Hemoglobin: 10.5 g/dL — ABNORMAL LOW (ref 12.0–16.0)
Immature Granulocytes %: 1 % — ABNORMAL HIGH (ref 0.0–0.5)
Lymphocytes %: 25 % (ref 21–52)
Lymphocytes Absolute: 2.1 10*3/uL (ref 0.9–3.6)
MCH: 29.1 PG (ref 24.0–34.0)
MCHC: 32.1 g/dL (ref 31.0–37.0)
MCV: 90.6 FL (ref 78.0–100.0)
MPV: 9.6 FL (ref 9.2–11.8)
Monocytes %: 6 % (ref 3–10)
Monocytes Absolute: 0.5 10*3/uL (ref 0.05–1.2)
NRBC Absolute: 0 10*3/uL (ref 0.00–0.01)
Neutrophils %: 63 % (ref 40–73)
Neutrophils Absolute: 5.1 10*3/uL (ref 1.8–8.0)
Nucleated RBCs: 0 PER 100 WBC
Platelets: 371 10*3/uL (ref 135–420)
RBC: 3.61 M/uL — ABNORMAL LOW (ref 4.20–5.30)
RDW: 14.1 % (ref 11.6–14.5)
WBC: 8.2 10*3/uL (ref 4.6–13.2)

## 2020-05-30 LAB — PROTIME-INR
INR: 1.5 — ABNORMAL HIGH (ref 0.8–1.2)
Protime: 18.3 s — ABNORMAL HIGH (ref 11.5–15.2)

## 2020-05-30 LAB — TROPONIN, HIGH SENSITIVITY: Troponin, High Sensitivity: 8 ng/L (ref 0–54)

## 2020-05-30 LAB — PROBNP, N-TERMINAL: BNP: 292 PG/ML (ref 0–900)

## 2020-05-30 MED ORDER — IOPAMIDOL 76 % IV SOLN
370 mg iodine /mL (76 %) | Freq: Once | INTRAVENOUS | Status: AC
Start: 2020-05-30 — End: 2020-05-30
  Administered 2020-05-30: 23:00:00 via INTRAVENOUS

## 2020-05-30 MED ORDER — PREGABALIN 50 MG CAP
50 mg | ORAL | Status: AC
Start: 2020-05-30 — End: 2020-05-30
  Administered 2020-05-30: 22:00:00 via ORAL

## 2020-05-30 MED FILL — PREGABALIN 50 MG CAP: 50 mg | ORAL | Qty: 1

## 2020-05-30 MED FILL — ISOVUE-370  76 % INTRAVENOUS SOLUTION: 370 mg iodine /mL (76 %) | INTRAVENOUS | Qty: 100

## 2020-05-30 NOTE — ED Notes (Signed)
Pt arrived via EMS for reported left arm pain. Pt states she woke up this morning with left upper arm pain and swelling.  Pt denies any injury.  Pt also reports shortness of breath worse on exertion. Pt reports recent hospital admit for covid and is on oxygen at home 3 liters.

## 2020-05-30 NOTE — ED Notes (Deleted)
6:03 PM :Pt care assumed from Silvio Pate, PA-C, ED provider. Pt complaint(s), current treatment plan, progression and available diagnostic results have been discussed thoroughly.  Rounding occurred: no  Intended Disposition: Home or ADMIT   Pending diagnostic reports and/or labs (please list): Has DVT, is on coumadin for hx of valve issues. INR 1.5. Is c/o shortness of breath, is pending a CTA. Consult vascular for the INR for change in coumadin. Cardiology decreased her coumadin dose because INR was 7.6 10 days ago. Is this failed therapy vs subtherapeutic due to reduction in coumadin.   3 lpm NC at baseline since having COVID. Slightly tachycardic.   2 tabs M, W, F, 1.5 the other days.   If CTA +- consult [facility].     1935: Discussed case with Dr. Sheliah Hatch- Vascular surgery. Does not think this is due to failure of therapy, likely just subtherapeutic. Needs to be closer to therapeutic level. Increase warfarin and follow up Monday. Return if any worsening. NEG CTA.

## 2020-05-30 NOTE — Progress Notes (Signed)
Pt needs a 20G PIV in [facility] for CTA. Unable to get a hold of someone from the ER.

## 2020-05-30 NOTE — ED Provider Notes (Signed)
ED Provider Notes by Nuala Alpha, [place] at 05/30/20 1807                Author: Nuala Alpha, [place]  Service: EMERGENCY  Author Type: Physician Assistant       Filed: 05/30/20 1827  Date of Service: 05/30/20 1807  Status: Attested           Editor: Nuala Alpha, [place] (Physician Assistant)  Cosigner: Luetta Nutting, DO at 06/02/20 0720          Attestation signed by Luetta Nutting, DO at 06/02/20 0720          I personally saw and examined the patient.  Case discussed with me at the disposition not completed by the end of my shift.  Plan will be discussed with vascular  surgery regarding the plan for the DVT.  Patient was recently decreased on her Coumadin, likely need increased dose.  I have reviewed and agree with the MLP's findings, including all diagnostic interpretations, and plans as written.   I was present during  the key portions of separately billed procedures.     Luetta Nutting, DO                                    EMERGENCY DEPARTMENT HISTORY AND PHYSICAL EXAM      6:07 PM         Date: 05/30/2020   Patient Name: Mary [name]        History of Presenting Illness          Chief Complaint       Patient presents with        ?  Arm Pain             left        ?  Shortness of Breath        History Provided By: Patient      Additional History (Context): Mary [name]  is a 55 y.o. female with history  of bipolar disorder, anxiety, CHF, COPD, mitral regurgitation, acute respiratory failure 2/2 COVID, on chronic 3L nasal cannula who presents with complaint of left upper extremity pain and swelling x1 day.  Patient is concerned for DVT.  Patient also  notes mildly worsening dyspnea on exertion.  Denies fever or chills, diaphoresis, chest pain, pleuritic symptoms, leg edema, nausea or vomiting. Denies hx of PE, hemoptysis, recent surgery/travel.  Notes her Coumadin was 7.6    10 days ago, cardiologist  reduced Coumadin to 2 tablets Monday, Wednesday, Friday, and 1-1/2 tablets every other day.       PCP:  Izora Ribas, MD        Current Outpatient Medications          Medication  Sig  Dispense  Refill           ?  cholecalciferol (VITAMIN D3) (1000 Units /25 mcg) tablet  Take 2 Tablets by mouth daily.  30 Tablet  3     ?  albuterol (PROVENTIL HFA, VENTOLIN HFA, PROAIR HFA) 90 mcg/actuation inhaler  Take 2 Puffs by inhalation every six (6) hours as needed for Wheezing or Shortness of Breath.  18 g  3     ?  ipratropium-albuteroL (Combivent Respimat) 20-100 mcg/actuation inhaler  Take 1 Puff by inhalation every six (6) hours.  4 g  3     ?  fluticasone propionate (FLONASE)  50 mcg/actuation nasal spray  1 spray by nasal route daily  1 Each  2     ?  hydrOXYzine HCL (ATARAX) 25 mg tablet  Take  by mouth nightly as needed.         ?  cyclobenzaprine (FLEXERIL) 5 mg tablet  Take 5 mg by mouth nightly as needed for Muscle Spasm(s).         ?  acetaminophen (Tylenol Extra Strength) 500 mg tablet  Take 1,000 mg by mouth every eight (8) hours as needed for Pain.         ?  pregabalin (LYRICA) 200 mg capsule  take 1 capsule by mouth three times a day  90 Capsule  1     ?  albuterol (PROVENTIL HFA, VENTOLIN HFA, PROAIR HFA) 90 mcg/actuation inhaler  Take 2 Puffs by inhalation every six (6) hours as needed.         ?  cetirizine (ZYRTEC) 10 mg tablet  Take 10 mg by mouth daily.         ?  fluticasone propionate (FLONASE) 50 mcg/actuation nasal spray  1 Spray by Nasal route daily.         ?  Combivent Respimat 20-100 mcg/actuation inhaler  INHALE 1 PUFF BY MOUTH INTO LUNGS EVERY 6 HOURS         ?  lamoTRIgine (LaMICtal) 25 mg tablet  TAKE 3 TABLETS BY MOUTH ONCE DAILY         ?  metoprolol succinate (TOPROL-XL) 25 mg XL tablet           ?  omeprazole (PRILOSEC) 40 mg capsule  Take 40 mg by mouth daily.         ?  pravastatin (PRAVACHOL) 40 mg tablet  Take 40 mg by mouth nightly.                 ?  warfarin (COUMADIN) 2.5 mg tablet  TAKE 2 TABLETS BY MOUTH ONCE DAILY ON MONDAY WEDNESDAY FRIDAY AND TAKE 1 & 1 2 (ONE AND ONE  HALF) ALL OTHER DAYS                 Past History        Past Medical History:     Past Medical History:        Diagnosis  Date         ?  Anxiety       ?  Bipolar 1 disorder ([place])       ?  CHF (congestive heart failure) ([place])       ?  COPD (chronic obstructive pulmonary disease) ([place])       ?  COPD (chronic obstructive pulmonary disease) ([place])       ?  H/O aortic valve replacement           ?  PTSD (post-traumatic stress disorder)             Past Surgical History:     Past Surgical History:         Procedure  Laterality  Date          ?  HX HEART CATHETERIZATION         ?  HX HEART VALVE SURGERY              mitral valve replacement           Family History:     Family History  Problem  Relation  Age of Onset          ?  No Known Problems  Mother             Social History:     Social History          Tobacco Use         ?  Smoking status:  Current Every Day Smoker              Packs/day:  1.00         ?  Smokeless tobacco:  Never Used       Substance Use Topics         ?  Alcohol use:  Never     ?  Drug use:  Yes              Types:  Marijuana           Allergies:     Allergies        Allergen  Reactions         ?  Adhesive  Rash         ?  Lithium  Nausea and Vomiting                Review of Systems           Review of Systems    Constitutional: Negative for chills and fever.    Respiratory: Positive for shortness of breath.     Cardiovascular: Negative for chest pain.    Gastrointestinal: Negative for abdominal pain, nausea and vomiting.    Musculoskeletal: Positive for arthralgias, joint swelling  and myalgias.    Skin: Negative for rash.    Neurological: Negative for weakness.    All other systems reviewed and are negative.              Physical Exam        Visit Vitals      BP  115/67     Pulse  (!) 106     Temp  99.3 ??F (37.4 ??C)     Resp  24     Ht  5' 3"  (1.6 m)     Wt  86.2 kg (190 lb)     SpO2  95%        BMI  33.66 kg/m??              Physical Exam   Vitals and nursing note reviewed.    Constitutional:        General: She is not in acute distress.     Appearance: She is well-developed. She is not ill-appearing, toxic-appearing or diaphoretic.   HENT:       Head: Normocephalic and atraumatic.   Cardiovascular :       Rate and Rhythm: Regular rhythm. Tachycardia present.      Heart sounds: Normal heart sounds. No murmur heard.   No friction rub. No gallop.     Pulmonary:       Effort: Pulmonary effort is normal. No respiratory distress.      Breath sounds: Normal breath sounds. No wheezing or rales.    Musculoskeletal:          General: Normal range of motion.      Left upper arm: Tenderness (medial  aspect of L forearm and elbow TTP without erythema/discoloration/edema) present.      Cervical back: Normal range of motion and neck supple.  Comments:  Moderate bruise mid-forearm (pt denies trauma, notes it was from previous IV stick), full ROM of extremity, radial pulse 2+     Skin:      General: Skin is warm.      Findings: No rash.   Neurological :       Mental Status: She is alert.                 Diagnostic Study Results        Labs -     Recent Results (from the past 12 hour(s))     EKG, 12 LEAD, INITIAL          Collection Time: 05/30/20 12:08 PM         Result  Value  Ref Range            Ventricular Rate  104  BPM       Atrial Rate  104  BPM       P-R Interval  130  ms       QRS Duration  86  ms       Q-T Interval  340  ms       QTC Calculation (Bezet)  447  ms       Calculated P Axis  96  degrees       Calculated R Axis  72  degrees       Calculated T Axis  103  degrees       Diagnosis                 Sinus tachycardia   Minimal voltage criteria for LVH, may be normal variant ( Sokolow-Lyon )   Nonspecific ST abnormality   Abnormal ECG   When compared with ECG of 07-May-2020 13:40,   Vent. rate has increased BY  39 BPM          CBC WITH AUTOMATED DIFF          Collection Time: 05/30/20 12:10 PM         Result  Value  Ref Range            WBC  8.2  4.6 - 13.2 K/uL       RBC  3.61 (L)   4.20 - 5.30 M/uL       HGB  10.5 (L)  12.0 - 16.0 g/dL       HCT  32.7 (L)  35.0 - 45.0 %       MCV  90.6  78.0 - 100.0 FL       MCH  29.1  24.0 - 34.0 PG       MCHC  32.1  31.0 - 37.0 g/dL       RDW  14.1  11.6 - 14.5 %       PLATELET  371  135 - 420 K/uL       MPV  9.6  9.2 - 11.8 FL       NRBC  0.0  0 PER 100 WBC       ABSOLUTE NRBC  0.00  0.00 - 0.01 K/uL       NEUTROPHILS  63  40 - 73 %       LYMPHOCYTES  25  21 - 52 %       MONOCYTES  6  3 - 10 %       EOSINOPHILS  5  0 - 5 %       BASOPHILS  1  0 - 2 %       IMMATURE GRANULOCYTES  1 (H)  0.0 - 0.5 %       ABS. NEUTROPHILS  5.1  1.8 - 8.0 K/UL       ABS. LYMPHOCYTES  2.1  0.9 - 3.6 K/UL       ABS. MONOCYTES  0.5  0.05 - 1.2 K/UL       ABS. EOSINOPHILS  0.4  0.0 - 0.4 K/UL       ABS. BASOPHILS  0.1  0.0 - 0.1 K/UL       ABS. IMM. GRANS.  0.1 (H)  0.00 - 0.04 K/UL       DF  AUTOMATED          METABOLIC PANEL, COMPREHENSIVE          Collection Time: 05/30/20 12:10 PM         Result  Value  Ref Range            Sodium  138  136 - 145 mmol/L       Potassium  3.8  3.5 - 5.5 mmol/L       Chloride  102  100 - 111 mmol/L       CO2  29  21 - 32 mmol/L       Anion gap  7  3.0 - 18 mmol/L       Glucose  94  74 - 99 mg/dL       BUN  11  7.0 - 18 MG/DL       Creatinine  0.85  0.6 - 1.3 MG/DL       BUN/Creatinine ratio  13  12 - 20         GFR est AA  >60  >60 ml/min/1.37m       GFR est non-AA  >60  >60 ml/min/1.729m      Calcium  9.5  8.5 - 10.1 MG/DL       Bilirubin, total  0.5  0.2 - 1.0 MG/DL       ALT (SGPT)  21  13 - 56 U/L       AST (SGOT)  16  10 - 38 U/L       Alk. phosphatase  77  45 - 117 U/L       Protein, total  7.3  6.4 - 8.2 g/dL       Albumin  2.7 (L)  3.4 - 5.0 g/dL       Globulin  4.6 (H)  2.0 - 4.0 g/dL       A-G Ratio  0.6 (L)  0.8 - 1.7         NT-PRO BNP          Collection Time: 05/30/20 12:10 PM         Result  Value  Ref Range            NT pro-BNP  292  0 - 900 PG/ML       TROPONIN-HIGH SENSITIVITY          Collection Time: 05/30/20 12:10 PM          Result  Value  Ref Range            Troponin-High Sensitivity  8  0 - 54 ng/L       PROTHROMBIN TIME + INR          Collection Time: 05/30/20 12:10 PM  Result  Value  Ref Range            Prothrombin time  18.3 (H)  11.5 - 15.2 sec            INR  1.5 (H)  0.8 - 1.2             Radiologic Studies -      XR CHEST PA LAT       Final Result          Findings may represent recurrent or residual bilateral infiltrates or edema.     Close follow-up to resolution is recommended to exclude additional etiology.             DUPLEX UPPER EXT VENOUS LEFT    (Results Pending)       CTA CHEST W OR W WO CONT    (Results Pending)     ??  Acute non-occlusive deep vein thrombosis in 1 of 2 brachial veins within the left upper extremity.   ??  Superficial acute non-occlusive thrombosis in the cephalic and basilic veins within the left upper extremity           Medical Decision Making     I am the first provider for this patient.      I reviewed the vital signs, available nursing notes, past medical history, past surgical history, family history and social history.      Vital Signs-Reviewed the patient's vital signs.      Pulse Oximetry Analysis -  95% on 3L nasal cannula       Records Reviewed: Nursing Notes, Old Medical Records and Previous electrocardiograms  (Time of Review: 6:07 PM)      ED Course: Progress Notes, Reevaluation, and Consults:   4:40 PM: Discussed doppler results with Dr. Jerline Pain, recommends CTA and vascular surgery consult.   Reviewed plan with patient, requesting Lyrica.       PROGRESS NOTE:   6:08 PM    Patient care will be transferred to [facility], PA-C.  Discussed available diagnostic results and care  plan at length. Pending CTA for disposition. If negative, consult vascular surgery for DVT of LUE. If positive, admit to PFM    Written by Scheryl Darter, PA-C        Diagnosis        Clinical Impression:       1.  Acute deep vein thrombosis (DVT) of brachial vein of left upper extremity (HCC)          2.  Dyspnea, unspecified type            Disposition: TBD         Follow-up Information      None                   Patient's Medications       Start Taking          No medications on file       Continue Taking           ACETAMINOPHEN (TYLENOL EXTRA STRENGTH) 500 MG TABLET     Take 1,000 mg by mouth every eight (8) hours as needed for Pain.           ALBUTEROL (PROVENTIL HFA, VENTOLIN HFA, PROAIR HFA) 90 MCG/ACTUATION INHALER     Take 2 Puffs by inhalation every six (6) hours as needed.           ALBUTEROL (PROVENTIL HFA, VENTOLIN HFA,  PROAIR HFA) 90 MCG/ACTUATION INHALER     Take 2 Puffs by inhalation every six (6) hours as needed for Wheezing or Shortness of Breath.           CETIRIZINE (ZYRTEC) 10 MG TABLET     Take 10 mg by mouth daily.           CHOLECALCIFEROL (VITAMIN D3) (1000 UNITS /25 MCG) TABLET     Take 2 Tablets by mouth daily.           COMBIVENT RESPIMAT 20-100 MCG/ACTUATION INHALER     INHALE 1 PUFF BY MOUTH INTO LUNGS EVERY 6 HOURS           CYCLOBENZAPRINE (FLEXERIL) 5 MG TABLET     Take 5 mg by mouth nightly as needed for Muscle Spasm(s).           FLUTICASONE PROPIONATE (FLONASE) 50 MCG/ACTUATION NASAL SPRAY     1 Spray by Nasal route daily.           FLUTICASONE PROPIONATE (FLONASE) 50 MCG/ACTUATION NASAL SPRAY     1 spray by nasal route daily           HYDROXYZINE HCL (ATARAX) 25 MG TABLET     Take  by mouth nightly as needed.           IPRATROPIUM-ALBUTEROL (COMBIVENT RESPIMAT) 20-100 MCG/ACTUATION INHALER     Take 1 Puff by inhalation every six (6) hours.           LAMOTRIGINE (LAMICTAL) 25 MG TABLET     TAKE 3 TABLETS BY MOUTH ONCE DAILY           METOPROLOL SUCCINATE (TOPROL-XL) 25 MG XL TABLET                OMEPRAZOLE (PRILOSEC) 40 MG CAPSULE     Take 40 mg by mouth daily.           PRAVASTATIN (PRAVACHOL) 40 MG TABLET     Take 40 mg by mouth nightly.             PREGABALIN (LYRICA) 200 MG CAPSULE     take 1 capsule by mouth three times a day           WARFARIN (COUMADIN) 2.5 MG TABLET      TAKE 2 TABLETS BY MOUTH ONCE DAILY ON MONDAY WEDNESDAY FRIDAY AND TAKE 1 & 1 2 (ONE AND ONE HALF) ALL OTHER DAYS       These Medications have changed          No medications on file       Stop Taking          No medications on file           Dictation disclaimer:  Please note that this dictation was completed with Dragon, the computer voice recognition software.  Quite often unanticipated grammatical, syntax, homophones, and other  interpretive errors are inadvertently transcribed by the computer software.  Please disregard these errors.  Please excuse any errors that have escaped final proofreading.

## 2020-05-30 NOTE — ED Notes (Signed)
Discharge instructions reviewed with patient.  Pt does not want to wait for coumadin,  States she will take it at home

## 2020-05-31 LAB — EKG, 12 LEAD, INITIAL
Atrial Rate: 104 {beats}/min
Calculated P Axis: 96 degrees
Calculated R Axis: 72 degrees
Calculated T Axis: 103 degrees
P-R Interval: 130 ms
Q-T Interval: 340 ms
QRS Duration: 86 ms
QTC Calculation (Bezet): 447 ms
Ventricular Rate: 104 {beats}/min

## 2020-05-31 LAB — EKG 12-LEAD
Atrial Rate: 104 {beats}/min
P Axis: 96 degrees
P-R Interval: 130 ms
Q-T Interval: 340 ms
QRS Duration: 86 ms
QTc Calculation (Bazett): 447 ms
R Axis: 72 degrees
T Axis: 103 degrees
Ventricular Rate: 104 {beats}/min

## 2020-05-31 MED ORDER — PHARMACY WARFARIN NOTE
Status: DC
Start: 2020-05-31 — End: 2020-05-30

## 2020-05-31 MED ORDER — WARFARIN 5 MG TAB
5 mg | ORAL | Status: AC
Start: 2020-05-31 — End: 2020-05-30
  Administered 2020-05-31: 01:00:00 via ORAL

## 2020-05-31 MED FILL — WARFARIN 5 MG TAB: 5 mg | ORAL | Qty: 1

## 2020-05-31 MED FILL — PHARMACY WARFARIN NOTE: Qty: 1

## 2020-06-02 ENCOUNTER — Ambulatory Visit: Attending: Cardiovascular Disease | Primary: Family Medicine

## 2020-06-03 ENCOUNTER — Telehealth: Attending: Physical Medicine & Rehabilitation | Primary: Family Medicine

## 2020-06-03 ENCOUNTER — Telehealth
Admit: 2020-06-03 | Discharge: 2020-06-03 | Payer: MEDICARE | Attending: Physical Medicine & Rehabilitation | Primary: Family Medicine

## 2020-06-03 DIAGNOSIS — G894 Chronic pain syndrome: Secondary | ICD-10-CM

## 2020-06-03 MED ORDER — CYCLOBENZAPRINE 5 MG TAB
5 mg | ORAL_TABLET | Freq: Every evening | ORAL | 1 refills | Status: DC | PRN
Start: 2020-06-03 — End: 2020-10-01

## 2020-06-03 MED ORDER — PREGABALIN 200 MG CAP
200 mg | ORAL_CAPSULE | Freq: Three times a day (TID) | ORAL | 2 refills | Status: DC
Start: 2020-06-03 — End: 2020-09-10

## 2020-06-03 NOTE — Progress Notes (Signed)
Progress  Notes by [name], MD at 06/03/20 0945                Author: [name], MD  Service: --  Author Type: Physician       Filed: 06/03/20 2104  Encounter Date: 06/03/2020  Status: Signed          Editor: [name], MD (Physician)                     [facility]  [address], [address]   [place], [place] [address]   Phone: [phone]   Fax: [phone]         Mary [name] is a 55 y.o.  female who was seen by synchronous (real-time) audio-video technology on 06/03/2020  through the [employer] platform.         This visit was performed while I was in my home. The patient was in their home.      ASSESSMENT AND PLAN     Diagnoses and all orders for this visit:      1. Chronic pain syndrome   -     pregabalin (LYRICA) 200 mg capsule; Take 1 Capsule by mouth three (3) times daily.   -     cyclobenzaprine (FLEXERIL) 5 mg tablet; Take 1 Tablet by mouth nightly as needed for Muscle Spasm(s).      2. Neck pain      3. Compression fracture of T10 vertebra with delayed healing, subsequent encounter      4. H/O kyphoplasty             1.  Mary [name] is a 55 y.o.  female with diffuse chronic pain, history kyphoplasty and recent COVID-pneumonia.  She may have her DEXA scan once her medical issues have stabilized.   2.  Continue with pain management appointment in March.   3.  Encouraged her to follow-up with [facility] social services in regards to resources for home care and/or nursing home placement.   4.  I refilled her Lyrica and Flexeril until her pain management appointment.        Follow-up and Dispositions      ??  Return if symptoms worsen or fail to improve.                HISTORY OF PRESENT ILLNESS  Mary [name] is a  55 y.o. female. Last visit over 4 months ago in September 2021.  At that time thoracic MRI  was ordered due to concerns of acute thoracic compression.  She has a history of kyphoplasty.  MRI was negative for acute fracture.  I ordered  a bone density.  She continues to have chronic pain and has a difficult home situation.      She was admitted for COVID-pneumonia and is now on home oxygen 3 L.  She was recently diagnosed with an upper extremity DVT, she is on chronic Coumadin.      She tells me that she has been unable to have her bone density.  She is having difficulty obtaining her oxygen.  We have previously referred her for an evaluation for home services and or nursing home placement to [place] [facility].      She reports that the Lyrica 200 3 times daily helps take the edge off.  Reports some benefit, no somnolence with Flexeril.  She has an upcoming appointment with pain management next month, cannot recall the  name of the provider.         Pain Assessment   06/03/2020        Location of Pain  Back     Severity of Pain  7     Quality of Pain  Aching;Sharp     Duration of Pain  Persistent     Frequency of Pain  Constant     Aggravating Factors  (No Data)     Aggravating Factors Comment  sitting too much     Limiting Behavior  Yes     Relieving Factors  (No Data)        Relieving Factors Comment  try to move around         No visible dyspnea on video today.  Appropriate responses to my questions.      Updated medical history, surgical history, medications, and allergies have been reviewed by the office staff or myself.            Mary [name], who was evaluated through a synchronous (real-time) virtual platform audio-video encounter, and/or her healthcare decision maker, is aware that it is a billable service, which includes  applicable co-pays, with coverage as determined by her insurance carrier. She provided verbal consent to proceed and patient identification was verified. This visit was conducted pursuant to the emergency declaration under the [employer] and the [employer], 1135 waiver authority and the [employer] and [employer] Act. A caregiver was present when appropriate.  Ability to conduct physical exam was limited. The patient was located at home in a state where  the provider was licensed to provide care.       --[name], MD on 06/03/2020      Dragon voice recognition software was used in the creation of this note.  Unintended transcription, spelling, and grammar errors may be present.  This document has been electronically signed but not  proofread for these specific errors.      [name], MD

## 2020-06-03 NOTE — Progress Notes (Signed)
 Mary [name] presents today for   Chief Complaint   Patient presents with   . Back Pain       Is someone accompanying this pt? no    Is the patient using any DME equipment during OV? no      Coordination of Care:  1. Have you been to the ER, urgent care clinic since your last visit? Yes, pt went to the ER this past weekend. Pt has a blood clot in her left upper arm. Notes in connect care.  Hospitalized since your last visit? no    2. Have you seen or consulted any other health care providers outside of the [facility] since your last visit? no Include any pap smears or colon screening. no

## 2020-07-04 ENCOUNTER — Inpatient Hospital Stay
Admit: 2020-07-04 | Discharge: 2020-07-04 | Disposition: A | Discharge status: Home / Self Care | Payer: MEDICARE | Attending: Emergency Medicine

## 2020-07-04 ENCOUNTER — Emergency Department: Admit: 2020-07-04 | Payer: MEDICARE | Primary: Family Medicine

## 2020-07-04 DIAGNOSIS — L03011 Cellulitis of right finger: Secondary | ICD-10-CM

## 2020-07-04 MED ORDER — HYDROCODONE-ACETAMINOPHEN 5 MG-325 MG TAB
5-325 mg | ORAL | Status: AC
Start: 2020-07-04 — End: 2020-07-04
  Administered 2020-07-04: 15:00:00 via ORAL

## 2020-07-04 MED ORDER — CEPHALEXIN 500 MG CAP
500 mg | ORAL_CAPSULE | Freq: Four times a day (QID) | ORAL | 0 refills | Status: DC
Start: 2020-07-04 — End: 2020-07-07

## 2020-07-04 MED ORDER — TRIMETHOPRIM-SULFAMETHOXAZOLE 160 MG-800 MG TAB
160-800 mg | ORAL_TABLET | Freq: Two times a day (BID) | ORAL | 0 refills | Status: DC
Start: 2020-07-04 — End: 2020-07-07

## 2020-07-04 MED ORDER — CEPHALEXIN 250 MG CAP
250 mg | ORAL | Status: AC
Start: 2020-07-04 — End: 2020-07-04
  Administered 2020-07-04: 15:00:00 via ORAL

## 2020-07-04 MED ORDER — TRIMETHOPRIM-SULFAMETHOXAZOLE 160 MG-800 MG TAB
160-800 mg | Freq: Two times a day (BID) | ORAL | Status: DC
Start: 2020-07-04 — End: 2020-07-04
  Administered 2020-07-04: 15:00:00 via ORAL

## 2020-07-04 MED ORDER — HYDROCODONE-ACETAMINOPHEN 5 MG-325 MG TAB
5-325 mg | ORAL_TABLET | Freq: Four times a day (QID) | ORAL | 0 refills | Status: DC | PRN
Start: 2020-07-04 — End: 2020-07-05

## 2020-07-04 MED FILL — TRIMETHOPRIM-SULFAMETHOXAZOLE 160 MG-800 MG TAB: 160-800 mg | ORAL | Qty: 1

## 2020-07-04 MED FILL — CEPHALEXIN 250 MG CAP: 250 mg | ORAL | Qty: 2

## 2020-07-04 MED FILL — HYDROCODONE-ACETAMINOPHEN 5 MG-325 MG TAB: 5-325 mg | ORAL | Qty: 1

## 2020-07-04 NOTE — ED Provider Notes (Signed)
ED Provider Notes by Maryclare LabradorStanek, Hilding Quintanar E, PA at 07/04/20 1046                Author: Maryclare LabradorStanek, Ario Mcdiarmid E, PA  Service: Emergency Medicine  Author Type: Physician Assistant       Filed: 07/04/20 1102  Date of Service: 07/04/20 1046  Status: Attested Addendum          Editor: Maryclare LabradorStanek, Goku Harb E, PA (Physician Assistant)       Related Notes: Original Note by Maryclare LabradorStanek, Jadi Deyarmin E, PA (Physician Assistant) filed at 07/04/20  1101          Cosigner: Louretta ParmaMorales, Jorge, DO at 07/04/20 1735          Attestation signed by Louretta ParmaMorales, Jorge, DO at 07/04/20 1735          Attending attestation of a face to face patient encounter:      I have personally performed a substantive portion of the encounter. I agree with the documentation recorded by the APP, including the findings, treatment plan, and disposition. My clinical impression is Right Long Finger Infected Hemorrhagic Blister.  Low suspicion for flexor tenosynovitis, fracture blister, "fight bite." Agree with antibiotic choice as no purulent drainage noted.      Louretta ParmaJorge Morales, DO                                     EMERGENCY DEPARTMENT HISTORY AND PHYSICAL EXAM      Date: 07/04/2020   Patient Name: Mary HausenKaren Lanese        History of Presenting Illness          Chief Complaint       Patient presents with        ?  Finger Pain        ?  Finger Swelling              History Provided By:  Patient       Chief Complaint: right middle digit pain and swelling    Duration: 1 day    Timing: gradual    Location: right middle digit    Quality: swollen and painful    Severity: mild to moderate    Modifying Factors: worse after she thinks she was bite by a mosquito    Associated Symptoms: none          Additional History (Context): Mary HausenKaren Kimble  is a 55 y.o. female with a history  of COPD and CHF who presents today for issues listed above.  Patient reports she woke up this morning with right middle digit finger pain and swelling.  Patient reports she thinks she was bit by mosquito yesterday.   States there was a small little bite  mark last night when she went to bed however when she woke up this morning the lesion was much bigger.  Denies any active drainage.  Has not tried thing for this at home.  Denies any measurable fevers or chills.         PCP: Caryl AdaBrittman, Stanley, MD        Current Facility-Administered Medications             Medication  Dose  Route  Frequency  Provider  Last Rate  Last Admin              ?  trimethoprim-sulfamethoxazole (BACTRIM DS, SEPTRA DS) 160-800 mg per tablet 1 Tablet  1 Tablet  Oral  Q12H  Maryclare Labrador, [place]     1 Tablet at 07/04/20 1025          Current Outpatient Medications          Medication  Sig  Dispense  Refill           ?  HYDROcodone-acetaminophen (Norco) 5-325 mg per tablet  Take 1 Tablet by mouth every six (6) hours as needed for Pain for up to 3 days. Max Daily Amount: 4 Tablets.  12 Tablet  0     ?  cephALEXin (Keflex) 500 mg capsule  Take 1 Capsule by mouth four (4) times daily for 7 days.  28 Capsule  0     ?  trimethoprim-sulfamethoxazole (Bactrim DS) 160-800 mg per tablet  Take 1 Tablet by mouth two (2) times a day for 7 days.  14 Tablet  0     ?  pregabalin (LYRICA) 200 mg capsule  Take 1 Capsule by mouth three (3) times daily.  90 Capsule  2     ?  cyclobenzaprine (FLEXERIL) 5 mg tablet  Take 1 Tablet by mouth nightly as needed for Muscle Spasm(s).  30 Tablet  1     ?  albuterol (PROVENTIL HFA, VENTOLIN HFA, PROAIR HFA) 90 mcg/actuation inhaler  Take 2 Puffs by inhalation every six (6) hours as needed for Wheezing or Shortness of Breath.  18 g  3     ?  ipratropium-albuteroL (Combivent Respimat) 20-100 mcg/actuation inhaler  Take 1 Puff by inhalation every six (6) hours.  4 g  3     ?  fluticasone propionate (FLONASE) 50 mcg/actuation nasal spray  1 spray by nasal route daily  1 Each  2     ?  hydrOXYzine HCL (ATARAX) 25 mg tablet  Take  by mouth nightly as needed.         ?  acetaminophen (Tylenol Extra Strength) 500 mg tablet  Take 1,000 mg by  mouth every eight (8) hours as needed for Pain.         ?  albuterol (PROVENTIL HFA, VENTOLIN HFA, PROAIR HFA) 90 mcg/actuation inhaler  Take 2 Puffs by inhalation every six (6) hours as needed.         ?  cetirizine (ZYRTEC) 10 mg tablet  Take 10 mg by mouth daily.         ?  fluticasone propionate (FLONASE) 50 mcg/actuation nasal spray  1 Spray by Nasal route daily.         ?  Combivent Respimat 20-100 mcg/actuation inhaler  INHALE 1 PUFF BY MOUTH INTO LUNGS EVERY 6 HOURS         ?  lamoTRIgine (LaMICtal) 25 mg tablet  TAKE 3 TABLETS BY MOUTH ONCE DAILY         ?  metoprolol succinate (TOPROL-XL) 25 mg XL tablet  Take 25 mg by mouth daily.               ?  omeprazole (PRILOSEC) 40 mg capsule  Take 40 mg by mouth daily.               ?  pravastatin (PRAVACHOL) 40 mg tablet  Take 40 mg by mouth nightly.                 ?  warfarin (COUMADIN) 2.5 mg tablet  TAKE 2 TABLETS BY MOUTH ONCE DAILY ON MONDAY WEDNESDAY FRIDAY AND TAKE 1 & 1  2 (ONE AND ONE HALF) ALL OTHER DAYS                 Past History        Past Medical History:     Past Medical History:        Diagnosis  Date         ?  Anxiety       ?  Bipolar 1 disorder (HCC)       ?  CHF (congestive heart failure) (HCC)       ?  COPD (chronic obstructive pulmonary disease) (HCC)       ?  COPD (chronic obstructive pulmonary disease) (HCC)       ?  H/O aortic valve replacement           ?  PTSD (post-traumatic stress disorder)             Past Surgical History:     Past Surgical History:         Procedure  Laterality  Date          ?  HX HEART CATHETERIZATION         ?  HX HEART VALVE SURGERY              mitral valve replacement           Family History:     Family History         Problem  Relation  Age of Onset          ?  No Known Problems  Mother             Social History:     Social History          Tobacco Use         ?  Smoking status:  Current Every Day Smoker              Packs/day:  1.00         ?  Smokeless tobacco:  Never Used       Substance Use Topics          ?  Alcohol use:  Never     ?  Drug use:  Yes              Types:  Marijuana           Allergies:     Allergies        Allergen  Reactions         ?  Adhesive  Rash         ?  Lithium  Nausea and Vomiting                Review of Systems     Review of Systems    Constitutional: Negative for chills and fever.    HENT: Negative for congestion, rhinorrhea and sore throat.     Respiratory: Negative for cough and shortness of breath.     Cardiovascular: Negative for chest pain.    Gastrointestinal: Negative for abdominal pain, blood in stool, constipation, diarrhea, nausea and vomiting.    Genitourinary: Negative for dysuria, frequency and hematuria.    Musculoskeletal: Negative for back pain and myalgias.    Skin: Positive for color change. Negative for rash and wound.    Neurological: Negative for dizziness and headaches.    All other systems reviewed and are negative.      All Other Systems Negative  Physical Exam          Vitals:          07/04/20 1009        BP:  (!) 162/91     Pulse:  98     Resp:  16     Temp:  98.3 ??F (36.8 ??C)        SpO2:  100%        Physical Exam   Vitals and nursing note reviewed.   Constitutional:        General: She is not in acute distress.     Appearance: She is well-developed. She is not diaphoretic.    HENT:       Head: Normocephalic and atraumatic.   Eyes :       Conjunctiva/sclera: Conjunctivae normal.   Cardiovascular:       Rate and Rhythm: Normal rate and regular rhythm.      Heart sounds: Normal heart sounds.    Pulmonary:       Effort: Pulmonary effort is normal. No respiratory distress.      Breath sounds: Normal breath sounds.   Chest:       Chest wall: No tenderness.   Abdominal :      General: Bowel sounds are normal. There is no distension.      Palpations: Abdomen is soft.      Tenderness: There is no abdominal tenderness. There is no guarding or rebound.     Musculoskeletal:          General: No deformity.      Cervical back: Normal range of motion and neck  supple.    Skin:      General: Skin is warm and dry.      Findings: Erythema present.      Comments:  Small pea sized erythematous blister with surrounding swelling noted to the dorsal distal aspect of the right middle digit.  Tenderness to palpation.  No diffuse digit swelling or flexor tendon tenderness. Finger is not in a fixed position. Cap refill  < 3 seconds, strong radial pulse. No paronychia or felon     Neurological:       Mental Status: She is alert and oriented to person, place, and time.      Deep Tendon Reflexes: Reflexes are normal and symmetric.                 Diagnostic Study Results        Labs -    No results found for this or any previous visit (from the past 12 hour(s)).      Radiologic Studies -      XR 3RD FINGER RT MIN 2 V       Final Result          Soft tissue swelling. No evidence of acute fracture or dislocation.                                CT Results   (Last 48 hours)          None                 CXR Results   (Last 48 hours)          None                       Medical Decision Making  I am the first provider for this patient.      I reviewed the vital signs, available nursing notes, past medical history, past surgical history, family history and social history.      Vital Signs-Reviewed the patient's vital signs.         Records Reviewed: Nursing Notes and Old Medical Records       Procedures: None    Procedures      Provider Notes (Medical Decision Making):       differential diagnosis: Paronychia, felon, insect bite, tenosynovitis, cellulitis, abscess, injury/trauma      Plan: Patient has been seen by both myself and Dr. Mariah Milling.  Will order x-ray and dose of antibiotics.         11:00 AM   Have reviewed reassuring imaging with the patient.  Will discharge home with antibiotics and pain medication.  Have strongly encouraged close hand surgery follow-up and have advised patient to return in 2 days if symptoms worsen.  Tenosynovitis return  precautions have been given.  Have  discussed the importance of proper at home wound care with the patient.  Will discharge home.                      MED RECONCILIATION:     Current Facility-Administered Medications          Medication  Dose  Route  Frequency           ?  trimethoprim-sulfamethoxazole (BACTRIM DS, SEPTRA DS) 160-800 mg per tablet 1 Tablet   1 Tablet  Oral  Q12H          Current Outpatient Medications        Medication  Sig         ?  HYDROcodone-acetaminophen (Norco) 5-325 mg per tablet  Take 1 Tablet by mouth every six (6) hours as needed for Pain for up to 3 days. Max Daily Amount: 4 Tablets.     ?  cephALEXin (Keflex) 500 mg capsule  Take 1 Capsule by mouth four (4) times daily for 7 days.     ?  trimethoprim-sulfamethoxazole (Bactrim DS) 160-800 mg per tablet  Take 1 Tablet by mouth two (2) times a day for 7 days.     ?  pregabalin (LYRICA) 200 mg capsule  Take 1 Capsule by mouth three (3) times daily.     ?  cyclobenzaprine (FLEXERIL) 5 mg tablet  Take 1 Tablet by mouth nightly as needed for Muscle Spasm(s).     ?  albuterol (PROVENTIL HFA, VENTOLIN HFA, PROAIR HFA) 90 mcg/actuation inhaler  Take 2 Puffs by inhalation every six (6) hours as needed for Wheezing or Shortness of Breath.     ?  ipratropium-albuteroL (Combivent Respimat) 20-100 mcg/actuation inhaler  Take 1 Puff by inhalation every six (6) hours.     ?  fluticasone propionate (FLONASE) 50 mcg/actuation nasal spray  1 spray by nasal route daily     ?  hydrOXYzine HCL (ATARAX) 25 mg tablet  Take  by mouth nightly as needed.     ?  acetaminophen (Tylenol Extra Strength) 500 mg tablet  Take 1,000 mg by mouth every eight (8) hours as needed for Pain.     ?  albuterol (PROVENTIL HFA, VENTOLIN HFA, PROAIR HFA) 90 mcg/actuation inhaler  Take 2 Puffs by inhalation every six (6) hours as needed.     ?  cetirizine (ZYRTEC) 10 mg tablet  Take 10 mg by mouth daily.     ?  fluticasone propionate (FLONASE) 50 mcg/actuation nasal spray  1 Spray by Nasal route daily.     ?  Combivent  Respimat 20-100 mcg/actuation inhaler  INHALE 1 PUFF BY MOUTH INTO LUNGS EVERY 6 HOURS     ?  lamoTRIgine (LaMICtal) 25 mg tablet  TAKE 3 TABLETS BY MOUTH ONCE DAILY         ?  metoprolol succinate (TOPROL-XL) 25 mg XL tablet  Take 25 mg by mouth daily.         ?  omeprazole (PRILOSEC) 40 mg capsule  Take 40 mg by mouth daily.     ?  pravastatin (PRAVACHOL) 40 mg tablet  Take 40 mg by mouth nightly.           ?  warfarin (COUMADIN) 2.5 mg tablet  TAKE 2 TABLETS BY MOUTH ONCE DAILY ON MONDAY WEDNESDAY FRIDAY AND TAKE 1 & 1 2 (ONE AND ONE HALF) ALL OTHER DAYS           Disposition:   Home       DISCHARGE NOTE:    Pt has been reexamined. Patient has no new complaints, changes, or physical findings.  Care plan outlined and precautions discussed.  Results of workup were reviewed with the patient. All medications were reviewed with the patient. All of pt's questions  and concerns were addressed. Patient was instructed and agrees to follow up with PCP/Hand surgery as well as to return to the ED upon further deterioration. Patient is ready to go home.        Follow-up Information               Follow up With  Specialties  Details  Why  Contact Info              [facility] EMERGENCY DEPT  Emergency Medicine    As needed  [address]   [place] [address]   [phone]              Caryl Ada, MD  Family Medicine  Schedule an appointment as soon as possible for a visit in 2 days  For wound re-check  [address]   [address]   [place] [address]   [phone]                 Peggyann Shoals, DO  Hand Surgery  Schedule an appointment as soon as possible for a visit     [address]   [address]   [place] [address]   [phone]                     Current Discharge Medication List              START taking these medications          Details        HYDROcodone-acetaminophen (Norco) 5-325 mg per tablet  Take 1 Tablet by mouth every six (6) hours as needed for Pain for up to 3 days. Max Daily Amount: 4  Tablets.   Qty: 12 Tablet, Refills:  0   Start date: 07/04/2020, End date:  07/07/2020          Associated Diagnoses: Cellulitis of finger of right hand               cephALEXin (Keflex) 500 mg capsule  Take 1 Capsule by mouth four (4) times daily for 7 days.   Qty: 28 Capsule, Refills:  0   Start date: 07/04/2020, End date:  07/11/2020               trimethoprim-sulfamethoxazole (Bactrim DS) 160-800 mg per tablet  Take 1 Tablet by mouth two (2) times a day for 7 days.   Qty: 14 Tablet, Refills:  0   Start date: 07/04/2020, End date:  07/11/2020                                 Diagnosis        Clinical Impression:       1.  Cellulitis of finger of right hand               "Please note that this dictation was completed with Dragon, the computer voice recognition software. Quite often unanticipated grammatical, syntax, homophones, and other interpretive errors are inadvertently transcribed by the computer software. Please  disregard these errors. Please excuse any errors that have escaped final proofreading."

## 2020-07-04 NOTE — ED Notes (Signed)
 I think I got bitten by something on my finger. My finger is swollen and painful.

## 2020-07-05 ENCOUNTER — Emergency Department: Admit: 2020-07-05 | Payer: MEDICARE | Primary: Family Medicine

## 2020-07-05 ENCOUNTER — Inpatient Hospital Stay
Admit: 2020-07-05 | Discharge: 2020-07-07 | Disposition: A | Discharge status: Home / Self Care | Payer: MEDICARE | Attending: Family Medicine | Admitting: Family Medicine

## 2020-07-05 DIAGNOSIS — L02511 Cutaneous abscess of right hand: Principal | ICD-10-CM

## 2020-07-05 LAB — URINALYSIS W/ RFLX MICROSCOPIC
Bilirubin, Urine: NEGATIVE
Bilirubin: NEGATIVE
Blood, Urine: NEGATIVE
Blood: NEGATIVE
Glucose, Ur: NEGATIVE mg/dL
Glucose: NEGATIVE mg/dL
Ketone: NEGATIVE mg/dL
Ketones, Urine: NEGATIVE mg/dL
Leukocyte Esterase, Urine: NEGATIVE
Leukocyte Esterase: NEGATIVE
Nitrite, Urine: NEGATIVE
Nitrites: NEGATIVE
Protein, UA: NEGATIVE mg/dL
Protein: NEGATIVE mg/dL
Specific Gravity, UA: <1.005 NA — ABNORMAL LOW (ref 1.005–1.030)
Specific gravity: <1.005 — ABNORMAL LOW (ref 1.005–1.030)
Urobilinogen, UA, POCT: 0.2 EU/dL (ref 0.2–1.0)
Urobilinogen: 0.2 EU/dL (ref 0.2–1.0)
pH (UA): 6.5 (ref 5.0–8.0)
pH, UA: 6.5 (ref 5.0–8.0)

## 2020-07-05 LAB — METABOLIC PANEL, COMPREHENSIVE
A-G Ratio: 0.7 — ABNORMAL LOW (ref 0.8–1.7)
ALT (SGPT): 15 U/L (ref 13–56)
AST (SGOT): 11 U/L (ref 10–38)
Albumin: 3.4 g/dL (ref 3.4–5.0)
Alk. phosphatase: 69 U/L (ref 45–117)
Anion gap: 7 mmol/L (ref 3.0–18)
BUN/Creatinine ratio: 10 — ABNORMAL LOW (ref 12–20)
BUN: 9 MG/DL (ref 7.0–18)
Bilirubin, total: 0.5 MG/DL (ref 0.2–1.0)
CO2: 23 mmol/L (ref 21–32)
Calcium: 9.3 MG/DL (ref 8.5–10.1)
Chloride: 106 mmol/L (ref 100–111)
Creatinine: 0.86 MG/DL (ref 0.6–1.3)
GFR est AA: >60 mL/min/{1.73_m2} (ref >60)
GFR est non-AA: >60 mL/min/{1.73_m2} (ref >60)
Globulin: 4.6 g/dL — ABNORMAL HIGH (ref 2.0–4.0)
Glucose: 94 mg/dL (ref 74–99)
Potassium: 4 mmol/L (ref 3.5–5.5)
Protein, total: 8 g/dL (ref 6.4–8.2)
Sodium: 136 mmol/L (ref 136–145)

## 2020-07-05 LAB — CBC WITH AUTOMATED DIFF
ABS. BASOPHILS: 0.1 10*3/uL (ref 0.0–0.1)
ABS. EOSINOPHILS: 0.1 10*3/uL (ref 0.0–0.4)
ABS. IMM. GRANS.: 0 10*3/uL (ref 0.00–0.04)
ABS. LYMPHOCYTES: 1.8 10*3/uL (ref 0.9–3.6)
ABS. MONOCYTES: 0.6 10*3/uL (ref 0.05–1.2)
ABS. NEUTROPHILS: 7.7 10*3/uL (ref 1.8–8.0)
ABSOLUTE NRBC: 0 10*3/uL (ref 0.00–0.01)
BASOPHILS: 1 % (ref 0–2)
EOSINOPHILS: 1 % (ref 0–5)
HCT: 37.3 % (ref 35.0–45.0)
HGB: 11.4 g/dL — ABNORMAL LOW (ref 12.0–16.0)
IMMATURE GRANULOCYTES: 0 % (ref 0.0–0.5)
LYMPHOCYTES: 18 % — ABNORMAL LOW (ref 21–52)
MCH: 26 PG (ref 24.0–34.0)
MCHC: 30.6 g/dL — ABNORMAL LOW (ref 31.0–37.0)
MCV: 85 FL (ref 78.0–100.0)
MONOCYTES: 6 % (ref 3–10)
MPV: 9.5 FL (ref 9.2–11.8)
NEUTROPHILS: 75 % — ABNORMAL HIGH (ref 40–73)
NRBC: 0 PER 100 WBC
PLATELET: 356 10*3/uL (ref 135–420)
RBC: 4.39 M/uL (ref 4.20–5.30)
RDW: 14.1 % (ref 11.6–14.5)
WBC: 10.3 10*3/uL (ref 4.6–13.2)

## 2020-07-05 LAB — COVID-19 RAPID TEST: COVID-19 rapid test: NOT DETECTED

## 2020-07-05 LAB — POC LACTIC ACID: Lactic Acid (POC): 0.69 mmol/L (ref 0.40–2.00)

## 2020-07-05 LAB — PROTHROMBIN TIME + INR
INR: 1.8 — ABNORMAL HIGH (ref 0.8–1.2)
Prothrombin time: 20.6 s — ABNORMAL HIGH (ref 11.5–15.2)

## 2020-07-05 LAB — CBC WITH AUTO DIFFERENTIAL
Basophils %: 1 % (ref 0–2)
Basophils Absolute: 0.1 10*3/uL (ref 0.0–0.1)
Eosinophils %: 1 % (ref 0–5)
Eosinophils Absolute: 0.1 10*3/uL (ref 0.0–0.4)
Granulocyte Absolute Count: 0 10*3/uL (ref 0.00–0.04)
Hematocrit: 37.3 % (ref 35.0–45.0)
Hemoglobin: 11.4 g/dL — ABNORMAL LOW (ref 12.0–16.0)
Immature Granulocytes %: 0 % (ref 0.0–0.5)
Lymphocytes %: 18 % — ABNORMAL LOW (ref 21–52)
Lymphocytes Absolute: 1.8 10*3/uL (ref 0.9–3.6)
MCH: 26 PG (ref 24.0–34.0)
MCHC: 30.6 g/dL — ABNORMAL LOW (ref 31.0–37.0)
MCV: 85 FL (ref 78.0–100.0)
MPV: 9.5 FL (ref 9.2–11.8)
Monocytes %: 6 % (ref 3–10)
Monocytes Absolute: 0.6 10*3/uL (ref 0.05–1.2)
NRBC Absolute: 0 10*3/uL (ref 0.00–0.01)
Neutrophils %: 75 % — ABNORMAL HIGH (ref 40–73)
Neutrophils Absolute: 7.7 10*3/uL (ref 1.8–8.0)
Nucleated RBCs: 0 PER 100 WBC
Platelets: 356 10*3/uL (ref 135–420)
RBC: 4.39 M/uL (ref 4.20–5.30)
RDW: 14.1 % (ref 11.6–14.5)
WBC: 10.3 10*3/uL (ref 4.6–13.2)

## 2020-07-05 LAB — COMPREHENSIVE METABOLIC PANEL
ALT: 15 U/L (ref 13–56)
AST: 11 U/L (ref 10–38)
Albumin/Globulin Ratio: 0.7 — ABNORMAL LOW (ref 0.8–1.7)
Albumin: 3.4 g/dL (ref 3.4–5.0)
Alkaline Phosphatase: 69 U/L (ref 45–117)
Anion Gap: 7 mmol/L (ref 3.0–18)
BUN/Creatinine Ratio: 10 — ABNORMAL LOW (ref 12–20)
BUN: 9 MG/DL (ref 7.0–18)
CO2: 23 mmol/L (ref 21–32)
Calcium: 9.3 MG/DL (ref 8.5–10.1)
Chloride: 106 mmol/L (ref 100–111)
Creatinine: 0.86 MG/DL (ref 0.6–1.3)
GFR African American: >60 mL/min/{1.73_m2} (ref >60)
Globulin: 4.6 g/dL — ABNORMAL HIGH (ref 2.0–4.0)
Glucose: 94 mg/dL (ref 74–99)
Potassium: 4 mmol/L (ref 3.5–5.5)
Sodium: 136 mmol/L (ref 136–145)
Total Bilirubin: 0.5 MG/DL (ref 0.2–1.0)
Total Protein: 8 g/dL (ref 6.4–8.2)
eGFR NON-AA: >60 mL/min/{1.73_m2} (ref >60)

## 2020-07-05 LAB — POCT LACTIC ACID: POC Lactic Acid: 0.69 mmol/L (ref 0.40–2.00)

## 2020-07-05 LAB — COVID-19, RAPID: SARS-CoV-2, Rapid: NOT DETECTED

## 2020-07-05 LAB — PROTIME-INR
INR: 1.8 — ABNORMAL HIGH (ref 0.8–1.2)
Protime: 20.6 s — ABNORMAL HIGH (ref 11.5–15.2)

## 2020-07-05 MED ORDER — SODIUM CHLORIDE 0.9 % IV
10 mg/mL | INTRAVENOUS | Status: DC | PRN
Start: 2020-07-05 — End: 2020-07-05
  Administered 2020-07-05 (×3): via INTRAVENOUS

## 2020-07-05 MED ORDER — VIAL-MATE ADAPTOR
1000 mg | Freq: Once | Status: DC
Start: 2020-07-05 — End: 2020-07-05

## 2020-07-05 MED ORDER — SODIUM CHLORIDE 0.9% BOLUS IV
0.9 % | Freq: Once | INTRAVENOUS | Status: AC
Start: 2020-07-05 — End: 2020-07-05
  Administered 2020-07-05: 18:00:00 via INTRAVENOUS

## 2020-07-05 MED ORDER — VANCOMYCIN IN 0.9 % SODIUM CHLORIDE 2 GRAM/500 ML IV
2 gram/500 mL | Freq: Once | INTRAVENOUS | Status: AC
Start: 2020-07-05 — End: 2020-07-05
  Administered 2020-07-05: 18:00:00 via INTRAVENOUS

## 2020-07-05 MED ORDER — PRAVASTATIN 20 MG TAB
20 mg | Freq: Every evening | ORAL | Status: DC
Start: 2020-07-05 — End: 2020-07-07
  Administered 2020-07-06 – 2020-07-07 (×2): via ORAL

## 2020-07-05 MED ORDER — FLUTICASONE 50 MCG/ACTUATION NASAL SPRAY, SUSP
50 mcg/actuation | Freq: Every day | NASAL | Status: DC
Start: 2020-07-05 — End: 2020-07-07
  Administered 2020-07-06 – 2020-07-07 (×2): via NASAL

## 2020-07-05 MED ORDER — LIDOCAINE (PF) 20 MG/ML (2 %) IJ SOLN
20 mg/mL (2 %) | INTRAMUSCULAR | Status: DC | PRN
Start: 2020-07-05 — End: 2020-07-05
  Administered 2020-07-05: via INTRAVENOUS

## 2020-07-05 MED ORDER — PROPOFOL 10 MG/ML IV EMUL
10 mg/mL | INTRAVENOUS | Status: DC | PRN
Start: 2020-07-05 — End: 2020-07-05
  Administered 2020-07-05: via INTRAVENOUS

## 2020-07-05 MED ORDER — DEXAMETHASONE SODIUM PHOSPHATE 4 MG/ML IJ SOLN
4 mg/mL | INTRAMUSCULAR | Status: DC | PRN
Start: 2020-07-05 — End: 2020-07-05
  Administered 2020-07-05: via INTRAVENOUS

## 2020-07-05 MED ORDER — PANTOPRAZOLE 40 MG TAB, DELAYED RELEASE
40 mg | Freq: Every day | ORAL | Status: DC
Start: 2020-07-05 — End: 2020-07-07
  Administered 2020-07-06 – 2020-07-07 (×2): via ORAL

## 2020-07-05 MED ORDER — HYDROXYZINE 25 MG TAB
25 mg | Freq: Two times a day (BID) | ORAL | Status: DC | PRN
Start: 2020-07-05 — End: 2020-07-07
  Administered 2020-07-05 – 2020-07-06 (×2): via ORAL

## 2020-07-05 MED ORDER — LAMOTRIGINE 25 MG TAB
25 mg | Freq: Every evening | ORAL | Status: DC
Start: 2020-07-05 — End: 2020-07-07
  Administered 2020-07-06 – 2020-07-07 (×2): via ORAL

## 2020-07-05 MED ORDER — SODIUM CHLORIDE 0.9 % IJ SYRG
Freq: Three times a day (TID) | INTRAMUSCULAR | Status: DC
Start: 2020-07-05 — End: 2020-07-07
  Administered 2020-07-06 – 2020-07-07 (×6): via INTRAVENOUS

## 2020-07-05 MED ORDER — PHARMACY WARFARIN NOTE
Status: DC
Start: 2020-07-05 — End: 2020-07-05

## 2020-07-05 MED ORDER — MORPHINE 4 MG/ML INTRAVENOUS SOLUTION
4 mg/mL | INTRAVENOUS | Status: AC
Start: 2020-07-05 — End: 2020-07-05
  Administered 2020-07-05: 19:00:00 via INTRAVENOUS

## 2020-07-05 MED ORDER — FENTANYL CITRATE (PF) 50 MCG/ML IJ SOLN
50 mcg/mL | INTRAMUSCULAR | Status: AC
Start: 2020-07-05 — End: ?

## 2020-07-05 MED ORDER — PREGABALIN 75 MG CAP
75 mg | Freq: Three times a day (TID) | ORAL | Status: DC
Start: 2020-07-05 — End: 2020-07-07
  Administered 2020-07-05 – 2020-07-07 (×7): via ORAL

## 2020-07-05 MED ORDER — ACETAMINOPHEN 500 MG TAB
500 mg | Freq: Three times a day (TID) | ORAL | Status: DC | PRN
Start: 2020-07-05 — End: 2020-07-05
  Administered 2020-07-05: 21:00:00 via ORAL

## 2020-07-05 MED ORDER — MORPHINE 2 MG/ML INJECTION
2 mg/mL | INTRAMUSCULAR | Status: DC | PRN
Start: 2020-07-05 — End: 2020-07-06

## 2020-07-05 MED ORDER — CETIRIZINE 10 MG TAB
10 mg | Freq: Every day | ORAL | Status: DC
Start: 2020-07-05 — End: 2020-07-07
  Administered 2020-07-06 – 2020-07-07 (×2): via ORAL

## 2020-07-05 MED ORDER — SODIUM CHLORIDE 0.9 % IJ SYRG
INTRAMUSCULAR | Status: DC | PRN
Start: 2020-07-05 — End: 2020-07-07

## 2020-07-05 MED ORDER — FUROSEMIDE 40 MG TAB
40 mg | Freq: Every day | ORAL | Status: DC | PRN
Start: 2020-07-05 — End: 2020-07-07

## 2020-07-05 MED ORDER — BUPIVACAINE (PF) 0.25 % (2.5 MG/ML) IJ SOLN
0.25 % (2.5 mg/mL) | INTRAMUSCULAR | Status: AC
Start: 2020-07-05 — End: ?

## 2020-07-05 MED ORDER — SODIUM CHLORIDE 0.9% BOLUS IV
0.9 % | Freq: Once | INTRAVENOUS | Status: AC
Start: 2020-07-05 — End: 2020-07-05
  Administered 2020-07-05: 20:00:00 via INTRAVENOUS

## 2020-07-05 MED ORDER — MIDAZOLAM 1 MG/ML IJ SOLN
1 mg/mL | INTRAMUSCULAR | Status: AC
Start: 2020-07-05 — End: ?

## 2020-07-05 MED ORDER — MIDAZOLAM 1 MG/ML IJ SOLN
1 mg/mL | INTRAMUSCULAR | Status: DC | PRN
Start: 2020-07-05 — End: 2020-07-05
  Administered 2020-07-05: via INTRAVENOUS

## 2020-07-05 MED ORDER — ONDANSETRON (PF) 4 MG/2 ML INJECTION
4 mg/2 mL | INTRAMUSCULAR | Status: DC | PRN
Start: 2020-07-05 — End: 2020-07-05
  Administered 2020-07-05: via INTRAVENOUS

## 2020-07-05 MED ORDER — WARFARIN 7.5 MG TAB
7.5 mg | ORAL | Status: DC
Start: 2020-07-05 — End: 2020-07-05

## 2020-07-05 MED ORDER — CEFTRIAXONE 1 GRAM SOLUTION FOR INJECTION
1 gram | INTRAMUSCULAR | Status: DC
Start: 2020-07-05 — End: 2020-07-07
  Administered 2020-07-05 – 2020-07-07 (×3): via INTRAVENOUS

## 2020-07-05 MED ORDER — VANCOMYCIN 1,000 MG IV SOLR
1000 mg | Freq: Two times a day (BID) | INTRAVENOUS | Status: DC
Start: 2020-07-05 — End: 2020-07-07
  Administered 2020-07-06 – 2020-07-07 (×4): via INTRAVENOUS

## 2020-07-05 MED ORDER — FENTANYL CITRATE (PF) 50 MCG/ML IJ SOLN
50 mcg/mL | INTRAMUSCULAR | Status: DC | PRN
Start: 2020-07-05 — End: 2020-07-05
  Administered 2020-07-05 – 2020-07-06 (×3): via INTRAVENOUS

## 2020-07-05 MED ORDER — CYCLOBENZAPRINE 10 MG TAB
10 mg | Freq: Every evening | ORAL | Status: DC | PRN
Start: 2020-07-05 — End: 2020-07-07

## 2020-07-05 MED ORDER — POLYETHYLENE GLYCOL 3350 17 GRAM (100 %) ORAL POWDER PACKET
17 gram | Freq: Every day | ORAL | Status: DC | PRN
Start: 2020-07-05 — End: 2020-07-07

## 2020-07-05 MED ORDER — WARFARIN 5 MG TAB
5 mg | ORAL | Status: DC
Start: 2020-07-05 — End: 2020-07-05

## 2020-07-05 MED FILL — LAMOTRIGINE 25 MG TAB: 25 mg | ORAL | Qty: 3

## 2020-07-05 MED FILL — BD POSIFLUSH NORMAL SALINE 0.9 % INJECTION SYRINGE: INTRAMUSCULAR | Qty: 40

## 2020-07-05 MED FILL — PREGABALIN 50 MG CAP: 50 mg | ORAL | Qty: 1

## 2020-07-05 MED FILL — SODIUM CHLORIDE 0.9 % IV: INTRAVENOUS | Qty: 1000

## 2020-07-05 MED FILL — MIDAZOLAM 1 MG/ML IJ SOLN: 1 mg/mL | INTRAMUSCULAR | Qty: 2

## 2020-07-05 MED FILL — MORPHINE 4 MG/ML SYRINGE: 4 mg/mL | INTRAMUSCULAR | Qty: 1

## 2020-07-05 MED FILL — FENTANYL CITRATE (PF) 50 MCG/ML IJ SOLN: 50 mcg/mL | INTRAMUSCULAR | Qty: 2

## 2020-07-05 MED FILL — CEFTRIAXONE 1 GRAM SOLUTION FOR INJECTION: 1 gram | INTRAMUSCULAR | Qty: 1

## 2020-07-05 MED FILL — VANCOMYCIN IN 0.9 % SODIUM CHLORIDE 2 GRAM/500 ML IV: 2 gram/500 mL | INTRAVENOUS | Qty: 500

## 2020-07-05 MED FILL — BD POSIFLUSH NORMAL SALINE 0.9 % INJECTION SYRINGE: INTRAMUSCULAR | Qty: 10

## 2020-07-05 MED FILL — SENSORCAINE-MPF 0.25 % (2.5 MG/ML) INJECTION SOLUTION: 0.25 % (2.5 mg/mL) | INTRAMUSCULAR | Qty: 10

## 2020-07-05 MED FILL — PHARMACY WARFARIN NOTE: Qty: 1

## 2020-07-05 MED FILL — ACETAMINOPHEN 500 MG TAB: 500 mg | ORAL | Qty: 2

## 2020-07-05 MED FILL — HYDROXYZINE 25 MG TAB: 25 mg | ORAL | Qty: 1

## 2020-07-05 NOTE — Progress Notes (Signed)
Brief Ortho Note:    To OR @ 6pm for I&D and Cultures.    NPO  Hold Blood Thinners  Recommend ID Consult.    Full Consult to follow.

## 2020-07-05 NOTE — ED Provider Notes (Signed)
EMERGENCY DEPARTMENT HISTORY AND PHYSICAL EXAM  This was created with voice recognition software and transcription errors may be present.     12:50 PM  Date: 07/05/2020  Patient Name: Mary [name]    History of Presenting Illness     Chief Complaint:    History Provided By:     HPI: Elias Bordner is a 55 y.o. female past medical history of anxiety bipolar CHF COPD who presents with an infected hand.  Patient was seen yesterday for small abscess on her middle finger on her right hand was prescribed antibiotics and presents today with worsening of the abscess as well as erythema that is extending proximally up her right arm no fevers or chills.  Patient was prescribed antibiotics and did take them.    PCP: Caryl Ada, MD      Past History     Past Medical History:  Past Medical History:   Diagnosis Date   ??? Anxiety    ??? Bipolar 1 disorder (HCC)    ??? CHF (congestive heart failure) (HCC)    ??? COPD (chronic obstructive pulmonary disease) (HCC)    ??? COPD (chronic obstructive pulmonary disease) (HCC)    ??? H/O aortic valve replacement    ??? PTSD (post-traumatic stress disorder)        Past Surgical History:  Past Surgical History:   Procedure Laterality Date   ??? HX HEART CATHETERIZATION     ??? HX HEART VALVE SURGERY      mitral valve replacement       Family History:  Family History   Problem Relation Age of Onset   ??? No Known Problems Mother        Social History:  Social History     Tobacco Use   ??? Smoking status: Current Every Day Smoker     Packs/day: 1.00   ??? Smokeless tobacco: Never Used   Substance Use Topics   ??? Alcohol use: Never   ??? Drug use: Yes     Types: Marijuana       Allergies:  Allergies   Allergen Reactions   ??? Adhesive Rash   ??? Lithium Nausea and Vomiting       Review of Systems     Review of Systems   All other systems reviewed and are negative.    10 point review of systems otherwise negative unless noted in HPI.    Physical Exam       Physical Exam  Constitutional:       Appearance: She is  well-developed.   HENT:      Head: Normocephalic and atraumatic.   Eyes:      Pupils: Pupils are equal, round, and reactive to light.   Cardiovascular:      Rate and Rhythm: Normal rate and regular rhythm.      Heart sounds: Normal heart sounds. No murmur heard.  No friction rub.   Pulmonary:      Effort: Pulmonary effort is normal. No respiratory distress.      Breath sounds: Normal breath sounds. No wheezing.   Abdominal:      General: There is no distension.      Palpations: Abdomen is soft.      Tenderness: There is no abdominal tenderness. There is no guarding or rebound.   Musculoskeletal:         General: Normal range of motion.      Cervical back: Normal range of motion and neck supple.   Skin:  General: Skin is warm and dry.      Comments: Patient presents with expansion of the abscess to her right middle finger with erythema extending proximally up the right forearm good radial pulse   Neurological:      Mental Status: She is alert and oriented to person, place, and time.   Psychiatric:         Behavior: Behavior normal.         Thought Content: Thought content normal.         Diagnostic Study Results     Vital Signs  EKG:  Labs:   Imaging:     Medical Decision Making     ED Course: Progress Notes, Reevaluation, and Consults:    I will be the provider of record for this patient.     Provider Notes (Medical Decision Making): Patient presents with worsening of infection after starting antibiotics.  Will start IV antibiotics and discuss with hand surgery.         Diagnosis     Clinical Impression: No diagnosis found.    Disposition:        Patient's Medications   Start Taking    No medications on file   Continue Taking    ACETAMINOPHEN (TYLENOL EXTRA STRENGTH) 500 MG TABLET    Take 1,000 mg by mouth every eight (8) hours as needed for Pain.    ALBUTEROL (PROVENTIL HFA, VENTOLIN HFA, PROAIR HFA) 90 MCG/ACTUATION INHALER    Take 2 Puffs by inhalation every six (6) hours as needed.    ALBUTEROL (PROVENTIL  HFA, VENTOLIN HFA, PROAIR HFA) 90 MCG/ACTUATION INHALER    Take 2 Puffs by inhalation every six (6) hours as needed for Wheezing or Shortness of Breath.    CEPHALEXIN (KEFLEX) 500 MG CAPSULE    Take 1 Capsule by mouth four (4) times daily for 7 days.    CETIRIZINE (ZYRTEC) 10 MG TABLET    Take 10 mg by mouth daily.    COMBIVENT RESPIMAT 20-100 MCG/ACTUATION INHALER    INHALE 1 PUFF BY MOUTH INTO LUNGS EVERY 6 HOURS    CYCLOBENZAPRINE (FLEXERIL) 5 MG TABLET    Take 1 Tablet by mouth nightly as needed for Muscle Spasm(s).    FLUTICASONE PROPIONATE (FLONASE) 50 MCG/ACTUATION NASAL SPRAY    1 Spray by Nasal route daily.    FLUTICASONE PROPIONATE (FLONASE) 50 MCG/ACTUATION NASAL SPRAY    1 spray by nasal route daily    HYDROCODONE-ACETAMINOPHEN (NORCO) 5-325 MG PER TABLET    Take 1 Tablet by mouth every six (6) hours as needed for Pain for up to 3 days. Max Daily Amount: 4 Tablets.    HYDROXYZINE HCL (ATARAX) 25 MG TABLET    Take  by mouth nightly as needed.    IPRATROPIUM-ALBUTEROL (COMBIVENT RESPIMAT) 20-100 MCG/ACTUATION INHALER    Take 1 Puff by inhalation every six (6) hours.    LAMOTRIGINE (LAMICTAL) 25 MG TABLET    TAKE 3 TABLETS BY MOUTH ONCE DAILY    METOPROLOL SUCCINATE (TOPROL-XL) 25 MG XL TABLET    Take 25 mg by mouth daily.    OMEPRAZOLE (PRILOSEC) 40 MG CAPSULE    Take 40 mg by mouth daily.    PRAVASTATIN (PRAVACHOL) 40 MG TABLET    Take 40 mg by mouth nightly.      PREGABALIN (LYRICA) 200 MG CAPSULE    Take 1 Capsule by mouth three (3) times daily.    TRIMETHOPRIM-SULFAMETHOXAZOLE (BACTRIM DS) 160-800 MG PER TABLET  Take 1 Tablet by mouth two (2) times a day for 7 days.    WARFARIN (COUMADIN) 2.5 MG TABLET    TAKE 2 TABLETS BY MOUTH ONCE DAILY ON MONDAY WEDNESDAY FRIDAY AND TAKE 1 & 1 2 (ONE AND ONE HALF) ALL OTHER DAYS   These Medications have changed    No medications on file   Stop Taking    No medications on file

## 2020-07-05 NOTE — Progress Notes (Signed)
Progress Notes by Llana Aliment, MD at 07/05/20 2123                Author: Llana Aliment, MD  Service: FAMILY MEDICINE  Author Type: Physician       Filed: 07/05/20 2138  Date of Service: 07/05/20 2123  Status: Attested           Editor: Llana Aliment, MD (Physician)  Cosigner: Lurlean Leyden, MD at 07/06/20 1416          Attestation signed by Lurlean Leyden, MD at 07/06/20 1416          Reviewed.                                         Post-procedure check   [facility]           Subjective:         Pt was seen around 21:15 back on the floor s/p I&D of R middle finger. Doing well, seen sitting up in bed and eating TV dinner. Denies CP, SOB, or current pain. Able to wiggle fingers of R hand, with residual numbness of index and middle fingers. On home  O2, 2L NC.        Objective:        Visit Vitals      BP  133/63 (BP 1 Location: Left upper arm, BP Patient Position: At rest)     Pulse  80     Temp  98.2 ??F (36.8 ??C)     Resp  17     Ht  5\' 3"  (1.6 m)     Wt  90.7 kg (200 lb)     SpO2  92%        BMI  35.43 kg/m??        Physical Exam:    General: well-appearing, NAD   HEENT: Conjunctiva pink, sclera anicteric. EOMI    CV:  RRR, no M/G/R   RESP: Unlabored breathing. Lungs CTAB, no wheezes, rales or rhonchi appreciated. Equal expansion bilaterally. On 2L NC   Neuro:  A+Ox3. Sensation intact in all but R middle finger s/p anesthesia.   Ext:  R middle hand and middle finger bandaged with all fingertips exposed. ROM limited but intact.   Skin: Warm & dry. No rashes, lesions, or ulcers.  Good turgor.    Psych: normal mood and affect         Assessment & Plan:     1. R middle finger abscess s/p ID 07/05/20   -pain controlled: perocet for moderate, morphine IV for severe PRN   -tolerating PO intake   -FU surgery recs   -cont Vanc, rocephin   -resume home AC: Warfarin per pharmacy dosing      For full assessment and plan, please see daily progress note.         07/07/20, MD,  PGY-1     [facility] Medicine       July 05, 2020, 9:26 PM

## 2020-07-05 NOTE — Consults (Signed)
Orthopedic Surgery Consult    Subjective:     Mary [name] is a 55 y.o. Caucasian female who is being seen for right middle finger infection. Onset of symptoms was gradual with gradually worsening course since that time. The pain is located in the right middle finger mostly dorsally and distal with some pain over the volar aspect of the digit and palm. Patient describes the pain as continuous and rated as moderate and severe. Pain has been associated with movement. Patient denies specific injury or bite.  She did notice a small red spot on the proximal aspect of her middle finger dorsally a few days prior.  She was initially seen in the emergency department yesterday and given oral antibiotics.  She reports over the last 24 hours significant worsening of her symptoms as well as the swelling on her finger.  She denies any fevers or chills.    Past Medical History:   Diagnosis Date   ??? Anxiety    ??? Bipolar 1 disorder ([place])    ??? CHF (congestive heart failure) ([place])    ??? COPD (chronic obstructive pulmonary disease) ([place])    ??? COPD (chronic obstructive pulmonary disease) ([place])    ??? H/O aortic valve replacement    ??? PTSD (post-traumatic stress disorder)       Past Surgical History:   Procedure Laterality Date   ??? HX HEART CATHETERIZATION     ??? HX HEART VALVE SURGERY      mitral valve replacement     Family History   Problem Relation Age of Onset   ??? No Known Problems Mother       Social History     Tobacco Use   ??? Smoking status: Current Every Day Smoker     Packs/day: 1.00   ??? Smokeless tobacco: Never Used   Substance Use Topics   ??? Alcohol use: Never       Current Facility-Administered Medications   Medication Dose Route Frequency Provider Last Rate Last Admin   ??? sodium chloride (NS) flush 5-10 mL  5-10 mL IntraVENous PRN Alemu, Emnet, MD       ??? cefTRIAXone (ROCEPHIN) 1 g in sterile water (preservative free) 10 mL IV syringe  1 g IntraVENous Q24H Alemu, Emnet, MD   1 g at 07/05/20 1324   ??? [START ON 07/06/2020]  vancomycin (VANCOCIN) 1,000 mg in 0.9% sodium chloride 250 mL (VIAL-MATE)  1,000 mg IntraVENous Q12H Alemu, Emnet, MD       ??? acetaminophen (TYLENOL) tablet 1,000 mg  1,000 mg Oral TID PRN Alemu, Emnet, MD   1,000 mg at 07/05/20 1610   ??? [START ON 07/06/2020] fluticasone propionate (FLONASE) 50 mcg/actuation nasal spray 2 Spray  2 Spray Both Nostrils DAILY Alemu, Emnet, MD       ??? furosemide (LASIX) tablet 40 mg  40 mg Oral DAILY PRN Alemu, Emnet, MD       ??? hydrOXYzine HCL (ATARAX) tablet 25 mg  25 mg Oral BID PRN Alemu, Emnet, MD   25 mg at 07/05/20 1610   ??? lamoTRIgine (LaMICtal) tablet 75 mg  75 mg Oral QHS Alemu, Emnet, MD       ??? [START ON 07/06/2020] cetirizine (ZYRTEC) tablet 10 mg  10 mg Oral DAILY Alemu, Emnet, MD       ??? cyclobenzaprine (FLEXERIL) tablet 5 mg  5 mg Oral QHS PRN Alemu, Emnet, MD       ??? [START ON 07/06/2020] pantoprazole (PROTONIX) tablet 40 mg  40  mg Oral DAILY Alemu, Emnet, MD       ??? pravastatin (PRAVACHOL) tablet 40 mg  40 mg Oral QHS Alemu, Emnet, MD       ??? pregabalin (LYRICA) capsule 200 mg  200 mg Oral TID Alemu, Emnet, MD   200 mg at 07/05/20 1610   ??? sodium chloride (NS) flush 5-40 mL  5-40 mL IntraVENous Q8H Alemu, Emnet, MD       ??? sodium chloride (NS) flush 5-40 mL  5-40 mL IntraVENous PRN Alemu, Emnet, MD       ??? polyethylene glycol (MIRALAX) packet 17 g  17 g Oral DAILY PRN Alemu, Emnet, MD       ??? morphine injection 1 mg  1 mg IntraVENous Q4H PRN Alemu, Emnet, MD       ??? Warfarin: MD to Dose   Other Q24H Myrle Sheng, MD            Allergies   Allergen Reactions   ??? Adhesive Rash   ??? Lithium Nausea and Vomiting        Review of Systems:  A comprehensive review of systems was negative except for that written in the History of Present Illness.     Objective:     Intake and Output:    No intake/output data recorded.  No intake/output data recorded.    Physical Exam:   Visit Vitals  BP 122/79 (BP 1 Location: Left upper arm, BP Patient Position: Sitting)   Pulse 84   Temp 98.5 ??F  (36.9 ??C)   Resp 18   Ht 5' 3"  (1.6 m)   Wt 200 lb (90.7 kg)   SpO2 97%   BMI 35.43 kg/m??     General appearance: alert, cooperative, no distress, appears stated age  Head: Normocephalic, without obvious abnormality, atraumatic  Eyes: negative  Nose: Nares normal. Septum midline. Mucosa normal. No drainage or sinus tenderness.  Throat: Lips, mucosa, and tongue normal. Teeth and gums normal  Neck: supple, symmetrical, trachea midline and no adenopathy  Back: symmetric, no curvature. ROM normal. No CVA tenderness.  Lungs: Normal effort, no respiratory distress  Abdomen: Soft nontender nondistended  Extremities: extremities normal, atraumatic, no cyanosis or edema   Right hand: There is a blister dorsally over the middle finger which may represent a superficial abscess as well.  There is erythema extending dorsally and proximally through the hand.  There is tenderness to palpation over the A1 pulley  as well as over the tendon sheath distally and volar.  There is a flexed posture of the middle finger.  There is fusiform swelling noted.  Any attempt to passively extend the finger causes severe pain.  Pulses: 2+ and symmetric  Skin: Skin color, texture, turgor normal. No rashes or lesions  Lymph nodes: Cervical, supraclavicular, and axillary nodes normal.  Neurologic: Grossly normal    07/04/2020 right middle finger plain films:  FINDINGS:   The fingers are held in partial flexion on all views. There is no evidence of  acute osseous injury or dislocation. Visualized joint spaces appear grossly  maintained.  Moderate diffuse soft tissue swelling best seen in the third digit  on lateral view.  No gas identified in the soft tissues to increase suspicion  for infection with a gas-forming organism.  IMPRESSION??  Soft tissue swelling. No evidence of acute fracture or dislocation.    Data Review:   Recent Results (from the past 24 hour(s))   EKG, 12 LEAD, INITIAL    Collection  Time: 07/05/20 12:50 PM   Result Value Ref Range     Ventricular Rate 82 BPM    Atrial Rate 82 BPM    P-R Interval 126 ms    QRS Duration 102 ms    Q-T Interval 390 ms    QTC Calculation (Bezet) 455 ms    Calculated P Axis 87 degrees    Calculated R Axis 29 degrees    Calculated T Axis 59 degrees    Diagnosis       Normal sinus rhythm  Minimal voltage criteria for LVH, may be normal variant ( Sokolow-Lyon )  Nonspecific ST and T wave abnormality  Abnormal ECG  When compared with ECG of 30-May-2020 12:08,  No significant change was found     POC LACTIC ACID    Collection Time: 07/05/20 12:52 PM   Result Value Ref Range    Lactic Acid (POC) 0.69 0.40 - 2.00 mmol/L   CBC WITH AUTOMATED DIFF    Collection Time: 07/05/20 12:53 PM   Result Value Ref Range    WBC 10.3 4.6 - 13.2 K/uL    RBC 4.39 4.20 - 5.30 M/uL    HGB 11.4 (L) 12.0 - 16.0 g/dL    HCT 37.3 35.0 - 45.0 %    MCV 85.0 78.0 - 100.0 FL    MCH 26.0 24.0 - 34.0 PG    MCHC 30.6 (L) 31.0 - 37.0 g/dL    RDW 14.1 11.6 - 14.5 %    PLATELET 356 135 - 420 K/uL    MPV 9.5 9.2 - 11.8 FL    NRBC 0.0 0 PER 100 WBC    ABSOLUTE NRBC 0.00 0.00 - 0.01 K/uL    NEUTROPHILS 75 (H) 40 - 73 %    LYMPHOCYTES 18 (L) 21 - 52 %    MONOCYTES 6 3 - 10 %    EOSINOPHILS 1 0 - 5 %    BASOPHILS 1 0 - 2 %    IMMATURE GRANULOCYTES 0 0.0 - 0.5 %    ABS. NEUTROPHILS 7.7 1.8 - 8.0 K/UL    ABS. LYMPHOCYTES 1.8 0.9 - 3.6 K/UL    ABS. MONOCYTES 0.6 0.05 - 1.2 K/UL    ABS. EOSINOPHILS 0.1 0.0 - 0.4 K/UL    ABS. BASOPHILS 0.1 0.0 - 0.1 K/UL    ABS. IMM. GRANS. 0.0 0.00 - 0.04 K/UL    DF AUTOMATED     METABOLIC PANEL, COMPREHENSIVE    Collection Time: 07/05/20 12:53 PM   Result Value Ref Range    Sodium 136 136 - 145 mmol/L    Potassium 4.0 3.5 - 5.5 mmol/L    Chloride 106 100 - 111 mmol/L    CO2 23 21 - 32 mmol/L    Anion gap 7 3.0 - 18 mmol/L    Glucose 94 74 - 99 mg/dL    BUN 9 7.0 - 18 MG/DL    Creatinine 0.86 0.6 - 1.3 MG/DL    BUN/Creatinine ratio 10 (L) 12 - 20      GFR est AA >60 >60 ml/min/1.30m    GFR est non-AA >60 >60 ml/min/1.767m   Calcium  9.3 8.5 - 10.1 MG/DL    Bilirubin, total 0.5 0.2 - 1.0 MG/DL    ALT (SGPT) 15 13 - 56 U/L    AST (SGOT) 11 10 - 38 U/L    Alk. phosphatase 69 45 - 117 U/L    Protein, total 8.0 6.4 - 8.2 g/dL  Albumin 3.4 3.4 - 5.0 g/dL    Globulin 4.6 (H) 2.0 - 4.0 g/dL    A-G Ratio 0.7 (L) 0.8 - 1.7     PROTHROMBIN TIME + INR    Collection Time: 07/05/20 12:53 PM   Result Value Ref Range    Prothrombin time 20.6 (H) 11.5 - 15.2 sec    INR 1.8 (H) 0.8 - 1.2     URINALYSIS W/ RFLX MICROSCOPIC    Collection Time: 07/05/20  1:40 PM   Result Value Ref Range    Color YELLOW      Appearance CLEAR      Specific gravity <1.005 (L) 1.005 - 1.030    pH (UA) 6.5 5.0 - 8.0      Protein Negative NEG mg/dL    Glucose Negative NEG mg/dL    Ketone Negative NEG mg/dL    Bilirubin Negative NEG      Blood Negative NEG      Urobilinogen 0.2 0.2 - 1.0 EU/dL    Nitrites Negative NEG      Leukocyte Esterase Negative NEG     COVID-19 RAPID TEST    Collection Time: 07/05/20  3:15 PM   Result Value Ref Range    Specimen source Nasopharyngeal      COVID-19 rapid test Not detected NOTD           Assessment:     Active Problems:    Cellulitis and abscess of hand (07/05/2020)      Abscess of finger (07/05/2020)      Suppurative tenosynovitis of flexor tendon of right hand (07/05/2020)      Abrasion of right middle finger with infection (07/05/2020)        Plan:     To the OR for I&D of dorsal abscess as well as flexor tendon sheath of right middle finger.  IV antibiotics per infectious disease.  OT in the postoperative period for early range of motion.

## 2020-07-05 NOTE — H&P (Signed)
H&P by Army Melia, MD at  07/05/20 1551                Author: Army Melia, MD  Service: FAMILY MEDICINE  Author Type: Resident       Filed: 07/06/20 0726  Date of Service: 07/05/20 1551  Status: Attested Addendum          Editor: Army Melia, MD (Resident)       Related Notes: Original Note by Army Melia, MD (Resident) filed at 07/05/20 1610          Cosigner: Enid Cutter, MD at 07/06/20 1616          Attestation signed by Enid Cutter, MD at 07/06/20 1616          I saw and examined the patient on 07/06/20.  I reviewed the resident's note and agree with assessment & plan below.        In addition, pt is s/p OR for I&D last night; she reports feeling better and is anxious for discharge home, notes nicotine withdrawal is uncomfortable wanting to go outside to smoke.  I advised pt cannot to smoke while admitted to the hospital, she is  agreeable to nicotine patch to improve her comfort.  I discuss w/ pt need for ID recs to guide antibiotic therapy given severity of her infection and will likely need to await culture results; pt voices understanding.  Sling ordered to also improve comfort  and Right hand edema.        Appreciate ID recs to await culture results, especially in the setting of hx prosthetic mitral valve replacement.  May discuss w/ ID any indication for follow-up CT chest w/ Jan 2022 showing hilar LAD and multifocal opacities w/ recent COVID.        Additional dx; Right third digit cellulitis with abscess s/p I&D on 07/05/20, hilar lymphadenopathy, bioprosthetic mitral valve, chronic systolic CHF- appears compensated, afib on chronic anticoagulation, CAD s/p CABG, chronic vertebral compression fractures  s/p kyphoplasty, tobacco dependency.                                           Admission History and Physical   [facility] [place] [facility]             Patient: Mary [name]  MRN: [id number]   CSN: [id number]          Date of Birth: 10/11/1965   Age: 55 y.o.   Sex: female          Admission Date: 07/05/2020           HPI:        Mary [name] is a 55 y.o. female with PMH: CHF (50%), COPD, home O2 (2L), MV prosthetic, BP/PTSD, neck fusion, fibromyalgia, smoker, pAFIb presenting w/ R Finger  abscess + cellulitis   X 2 days.  Patient states that she might of had a blood scutal bite about 2 days ago on the right middle finger.  She came to the ED on 2/19 where she was complaining of right middle digit pain and swelling and redness after mosquito bite. She denies  any fever today.  She states that the redness and swelling and pain has extended from her hands to her wrist her arms.  She was taking Bactrim Keflex.           ED course:  Per ED Dr. Redmond Pulling hand surgeon was contacted by the ED. will likely get I&D today.  Pending INR levels      Review of Systems:   General:  -chills, -fever, -night sweats, - -poor po intake   Resp: at baseline sob on 2 L at home, -cough, -wheezing, -orthopnea, - PND, -edem   Cardic:  - chest pain, -palpitations, -dizziness,-syncope,   GI: -ab pain, no diarrhea   GU: - suprapubic pain   Skin: - rash, -skin lesion changes, +redness, +pain, +swelling              Past Medical History:        Diagnosis  Date         ?  Anxiety       ?  Bipolar 1 disorder ([place])       ?  CHF (congestive heart failure) ([place])       ?  COPD (chronic obstructive pulmonary disease) ([place])       ?  COPD (chronic obstructive pulmonary disease) ([place])       ?  H/O aortic valve replacement           ?  PTSD (post-traumatic stress disorder)               Past Surgical History:         Procedure  Laterality  Date          ?  HX HEART CATHETERIZATION         ?  HX HEART VALVE SURGERY              mitral valve replacement             Family History         Problem  Relation  Age of Onset          ?  No Known Problems  Mother               Social History          Socioeconomic History         ?  Marital status:  DIVORCED       Tobacco Use         ?  Smoking status:  Current Every Day Smoker               Packs/day:  1.00         ?  Smokeless tobacco:  Never Used       Substance and Sexual Activity         ?  Alcohol use:  Never     ?  Drug use:  Yes              Types:  Marijuana             Allergies        Allergen  Reactions         ?  Adhesive  Rash         ?  Lithium  Nausea and Vomiting             Prior to Admission Medications     Prescriptions  Last Dose  Informant  Patient Reported?  Taking?      acetaminophen (Tylenol Extra Strength) 500 mg tablet      Yes  No      Sig: Take 1,000 mg by mouth every eight (8) hours as needed for Pain.  albuterol (PROVENTIL HFA, VENTOLIN HFA, PROAIR HFA) 90 mcg/actuation inhaler      Yes  No      Sig: Take 2 Puffs by inhalation every six (6) hours as needed.      cephALEXin (Keflex) 500 mg capsule      No  No      Sig: Take 1 Capsule by mouth four (4) times daily for 7 days.      cetirizine (ZYRTEC) 10 mg tablet      Yes  No      Sig: Take 10 mg by mouth daily.      cyclobenzaprine (FLEXERIL) 5 mg tablet      No  No      Sig: Take 1 Tablet by mouth nightly as needed for Muscle Spasm(s).      fluticasone propionate (FLONASE) 50 mcg/actuation nasal spray      No  No      Sig: 1 spray by nasal route daily      furosemide (Lasix) 40 mg tablet      Yes  Yes      Sig: Take 40 mg by mouth daily as needed (swelling).      hydrOXYzine HCL (ATARAX) 25 mg tablet      Yes  No      Sig: Take 25 mg by mouth two (2) times daily as needed for Anxiety.      ipratropium-albuteroL (Combivent Respimat) 20-100 mcg/actuation inhaler      No  No      Sig: Take 1 Puff by inhalation every six (6) hours.      lamoTRIgine (LaMICtal) 25 mg tablet      Yes  No      Sig: 75 mg nightly.      metoprolol succinate (TOPROL-XL) 25 mg XL tablet      Yes  No      Sig: Take 25 mg by mouth daily.      omeprazole (PRILOSEC) 40 mg capsule      Yes  No      Sig: Take 40 mg by mouth daily.      pravastatin (PRAVACHOL) 40 mg tablet      Yes  No      Sig: Take 40 mg by mouth nightly.        pregabalin (LYRICA) 200  mg capsule      No  No      Sig: Take 1 Capsule by mouth three (3) times daily.      trimethoprim-sulfamethoxazole (Bactrim DS) 160-800 mg per tablet      No  No      Sig: Take 1 Tablet by mouth two (2) times a day for 7 days.      warfarin (COUMADIN) 2.5 mg tablet      Yes  No      Sig: 2.5 mg. Takes 32m MWF and takes 3.75 mg Tue, Thrs, Sat, Sunday               Facility-Administered Medications: None                Physical Exam:        Patient Vitals for the past 24 hrs:            Temp  Pulse  Resp  BP  SpO2            02 /20/22 1237  98.5 ??F (36.9 ??C)  84  18  122/79  97 %  Physical Exam:   General:  NAD, on 2L NC   HEENT: Conjunctiva pink, sclera anicteric. Moist mucous membranes.  No LAD.    CV:  RRR, no murmurs    RESP:  Unlabored breathing.  Lungs clear to auscultation without adventitious breath sounds. Equal expansion bilaterally.     ABD:  Soft, nontender, nondistended. BS (+).   No suprapubic tenderness.    Neuro: Answers questions appropriately, Moves all four extremity   Ext:  No edema.  2+ radial and dp pulses bilaterally.   Skin:  R hand, middle finger digit abscess, redness spread from hand to arm, tender to touch, sensation intact, radial pulse intact, unable to straighten R hand middle finger             Chemistry  Recent Labs         07/05/20   1253      GLU  94      NA  136      K  4.0      CL  106      CO2  23      BUN  9      CREA  0.86      CA  9.3      AGAP  7      BUCR  10*      AP  69      TP  8.0      ALB  3.4      GLOB  4.6*      AGRAT  0.7*                    CBC w/Diff  Recent Labs         07/05/20   1253      WBC  10.3      RBC  4.39      HGB  11.4*      HCT  37.3      PLT  356      GRANS  75*      LYMPH  18*      EOS  1                    Liver Enzymes  Protein, total      Date  Value  Ref Range  Status      07/05/2020  8.0  6.4 - 8.2 g/dL  Final            Albumin      Date  Value  Ref Range  Status      07/05/2020  3.4  3.4 - 5.0 g/dL  Final            Globulin      Date   Value  Ref Range  Status      07/05/2020  4.6 (H)  2.0 - 4.0 g/dL  Final            A-G Ratio      Date  Value  Ref Range  Status      07/05/2020  0.7 (L)  0.8 - 1.7    Final            Alk. phosphatase      Date  Value  Ref Range  Status      07/05/2020  69  45 - 117 U/L  Final            Recent Labs  07/05/20   1253      TP  8.0      ALB  3.4      GLOB  4.6*      AGRAT  0.7*      AP  69                    Lactic Acid  Lactic acid      Date  Value  Ref Range  Status      05/07/2020  1.1  0.4 - 2.0 MMOL/L  Final            No results for input(s): LAC in the last 72 hours.           BNP  No results found for: BNP, BNPP, XBNPT           Cardiac Enzymes  No results found for: CPK, CK, CKMMB, CKMB, RCK3, CKMBT, CKNDX, CKND1, MYO, TROPT, TROIQ, TROI, TROPT, TNIPOC, BNP, BNPP           Coagulation  No results for input(s): PTP, INR, APTT, INREXT in the last 72 hours.            Thyroid   Lab Results      Component  Value  Date/Time        TSH  1.88  04/22/2020 08:52 AM                      Lipid Panel  No results found for: CHOL, CHOLPOCT, CHOLX, CHLST, CHOLV, 884269, HDL, HDLP, LDL, LDLC, DLDLP, 932355, VLDLC, VLDL, TGLX, TRIGL, TRIGP, TGLPOCT, CHHD,  CHHDX           ABG  No results for input(s): PHI, PHI, POC2, PCO2I, PO2, PO2I, HCO3, HCO3I, FIO2, FIO2I in the last 72 hours.           Urinalysis  Lab Results      Component  Value  Date/Time        Color  YELLOW  07/05/2020 01:40 PM        Appearance  CLEAR  07/05/2020 01:40 PM        Specific gravity  <1.005 (L)  07/05/2020 01:40 PM        pH (UA)  6.5  07/05/2020 01:40 PM        Protein  Negative  07/05/2020 01:40 PM        Glucose  Negative  07/05/2020 01:40 PM        Ketone  Negative  07/05/2020 01:40 PM        Bilirubin  Negative  07/05/2020 01:40 PM        Urobilinogen  0.2  07/05/2020 01:40 PM        Nitrites  Negative  07/05/2020 01:40 PM        Leukocyte Esterase  Negative  07/05/2020 01:40 PM        Epithelial cells  FEW  05/13/2020 05:00 AM         Bacteria  FEW (A)  05/13/2020 05:00 AM        WBC  0 to 2  05/13/2020 05:00 AM        RBC  Negative  05/13/2020 05:00 AM                    Micro  No results for input(s): SDES, CULT in the last 72 hours.   No results for input(s): CULT in the last 72  hours.           Imaging:      XR (Most Recent).  Results from [facility] encounter on 07/04/20      XR 3RD FINGER RT MIN 2 V      Narrative   Digit, three views      CPT CODE: 93267      INDICATION: Above. Finger pain and swelling.      COMPARISON: None.      TECHNIQUE: Three views of the third digit (middle finger) of the right hand are   reviewed.      FINDINGS:      The fingers are held in partial flexion on all views. There is no evidence of   acute osseous injury or dislocation. Visualized joint spaces appear grossly   maintained.  Moderate diffuse soft tissue swelling best seen in the third digit   on lateral view.  No gas identified in the soft tissues to increase suspicion   for infection with a gas-forming organism.      Impression   Soft tissue swelling. No evidence of acute fracture or dislocation.              CT (Most Recent)  Results from Hospital Encounter encounter on 05/30/20      CTA CHEST W OR W WO CONT      Narrative   EXAM: CT Angiogram of the Chest      CLINICAL INDICATION:  chest pain, dyspnea, LUE with DVT, evaluate for PE      TECHNIQUE: CT angiogram of the chest performed. MIPS performed      All CT scans at this facility are performed using dose optimization technique as   appropriate to a performed exam, to include automated exposure control,   adjustment of the mA and/or kV according to patient size (including appropriate   matching for site specific examination) or use of iterative reconstruction   technique.      IV CONTRAST: 70 cc of Isovue 370      COMPARISON: 04/05/2020      FINDINGS:   Limitations: Breathing motion is present which limits evaluation of the distal   pulmonary arteries and lung parenchyma.      Thyroid:  Unremarkable.      Mediastinum: Multiple likely reactive lymph nodes are noted. Several are   enlarged by size criteria. Prominent hilar lymph nodes are noted which are also   likely reactive.      Heart: Mild cardiac enlargement is noted. Mild) and coronary bypass grafting are   noted.      Pericardium: Unremarkable      Aorta: No evidence of aortic dissection or aneurysm.      Pulmonary Arteries: No evidence of pulmonary embolus.      Trachea and Bronchi: Unremarkable.      Pleura: No evidence of pleural effusion.      Lungs: Patient has baseline of COPD. Multifocal groundglass opacities are noted   most process in the periphery and lower lung fields. This likely represents   infectious process such as COVID. Other infectious etiologies are possible.      Axilla/Chest wall: Unremarkable.      Upper Abdomen: No acute findings.      Musculoskeletal: Sternotomy wires are present. Old vertebroplasty changes are   noted at T8. Old T12 and L1 compression fractures.      Impression   1.   Multifocal opacities. Findings may represent a process such as COVID.  However, other infectious etiologies are possible.      2.  COPD.      3.  Reactive mediastinal and hilar adenopathy.      4.  No evidence of pulmonary embolus or aortic dissection.              ECHO  No results found for this or any previous visit.           EKG  No results found for this or any previous visit.          Recent Results (from the past 12 hour(s))     EKG, 12 LEAD, INITIAL          Collection Time: 07/05/20 12:50 PM         Result  Value  Ref Range            Ventricular Rate  82  BPM       Atrial Rate  82  BPM       P-R Interval  126  ms       QRS Duration  102  ms       Q-T Interval  390  ms       QTC Calculation (Bezet)  455  ms       Calculated P Axis  87  degrees       Calculated R Axis  29  degrees       Calculated T Axis  59  degrees       Diagnosis                 Normal sinus rhythm   Minimal voltage criteria for LVH, may be normal variant (  Sokolow-Lyon )   Nonspecific ST and T wave abnormality   Abnormal ECG   When compared with ECG of 30-May-2020 12:08,   No significant change was found          POC LACTIC ACID          Collection Time: 07/05/20 12:52 PM         Result  Value  Ref Range            Lactic Acid (POC)  0.69  0.40 - 2.00 mmol/L       CBC WITH AUTOMATED DIFF          Collection Time: 07/05/20 12:53 PM         Result  Value  Ref Range            WBC  10.3  4.6 - 13.2 K/uL       RBC  4.39  4.20 - 5.30 M/uL       HGB  11.4 (L)  12.0 - 16.0 g/dL       HCT  37.3  35.0 - 45.0 %       MCV  85.0  78.0 - 100.0 FL       MCH  26.0  24.0 - 34.0 PG       MCHC  30.6 (L)  31.0 - 37.0 g/dL       RDW  14.1  11.6 - 14.5 %       PLATELET  356  135 - 420 K/uL       MPV  9.5  9.2 - 11.8 FL       NRBC  0.0  0 PER 100 WBC       ABSOLUTE NRBC  0.00  0.00 - 0.01 K/uL  NEUTROPHILS  75 (H)  40 - 73 %       LYMPHOCYTES  18 (L)  21 - 52 %       MONOCYTES  6  3 - 10 %       EOSINOPHILS  1  0 - 5 %       BASOPHILS  1  0 - 2 %       IMMATURE GRANULOCYTES  0  0.0 - 0.5 %       ABS. NEUTROPHILS  7.7  1.8 - 8.0 K/UL       ABS. LYMPHOCYTES  1.8  0.9 - 3.6 K/UL       ABS. MONOCYTES  0.6  0.05 - 1.2 K/UL       ABS. EOSINOPHILS  0.1  0.0 - 0.4 K/UL       ABS. BASOPHILS  0.1  0.0 - 0.1 K/UL       ABS. IMM. GRANS.  0.0  0.00 - 0.04 K/UL       DF  AUTOMATED          METABOLIC PANEL, COMPREHENSIVE          Collection Time: 07/05/20 12:53 PM         Result  Value  Ref Range            Sodium  136  136 - 145 mmol/L       Potassium  4.0  3.5 - 5.5 mmol/L       Chloride  106  100 - 111 mmol/L       CO2  23  21 - 32 mmol/L       Anion gap  7  3.0 - 18 mmol/L       Glucose  94  74 - 99 mg/dL       BUN  9  7.0 - 18 MG/DL       Creatinine  0.86  0.6 - 1.3 MG/DL       BUN/Creatinine ratio  10 (L)  12 - 20         GFR est AA  >60  >60 ml/min/1.61m       GFR est non-AA  >60  >60 ml/min/1.742m      Calcium  9.3  8.5 - 10.1 MG/DL       Bilirubin, total  0.5  0.2 - 1.0 MG/DL       ALT  (SGPT)  15  13 - 56 U/L       AST (SGOT)  11  10 - 38 U/L       Alk. phosphatase  69  45 - 117 U/L       Protein, total  8.0  6.4 - 8.2 g/dL       Albumin  3.4  3.4 - 5.0 g/dL       Globulin  4.6 (H)  2.0 - 4.0 g/dL       A-G Ratio  0.7 (L)  0.8 - 1.7         URINALYSIS W/ RFLX MICROSCOPIC          Collection Time: 07/05/20  1:40 PM         Result  Value  Ref Range            Color  YELLOW          Appearance  CLEAR          Specific gravity  <1.005 (L)  1.005 - 1.030       pH (UA)  6.5  5.0 - 8.0         Protein  Negative  NEG mg/dL       Glucose  Negative  NEG mg/dL       Ketone  Negative  NEG mg/dL       Bilirubin  Negative  NEG         Blood  Negative  NEG         Urobilinogen  0.2  0.2 - 1.0 EU/dL       Nitrites  Negative  NEG         Leukocyte Esterase  Negative  NEG         COVID-19 RAPID TEST          Collection Time: 07/05/20  3:15 PM         Result  Value  Ref Range            Specimen source  Nasopharyngeal               COVID-19 rapid test  Not detected  NOTD                  Assessment/Plan:     55 y.o. female with PMH PMH: CHF (50%), COPD, home O2 (2L), MV prosthetic, BP/PTSD, neck fusion, fibromyalgia, smoker, pAFIb presenting w/ Finger abscess + cellulitis.      Right middle finger abscess and cellulitis likely secondary to mosquito bite   No systemic signs   -Continue Rocephin and vancomycin   -Follow-up INR   -N.p.o. for procedure   -Follow-up rapid Covid test   -Hold warfarin, but restart after procedure per pharmacy dosing or use Lovenox   -May need to give vitamin K or FFP if INR elevated   -PT OT   -Morphine 1 mg every 4 hours as needed      COPD/history of Covid pneumonia on home O2 2 L as needed   Current smoker 10 cigarettes a day   -Continue DuoNebs   -Continue fluticasone and Zyrtec   -Continue oxygen as needed nasal cannula   -Patient does not want nicotine patch      CHF last EF 50%+prosthetic mitral valve   Sees Dr. Charisse Klinefelter   -Continue metoprolol succinate 25 daily   -Patient not on ACE  or ARB   -Patient on as needed Lasix for swelling 40 mg   -Hold warfarin for now procedure      GERD   -Continue PPI      PTSD bipolar Fibromyalgia   -Continue lamotrigine 75 mg daily   -Continue Lyrica 230m  times a day   Continue Flexeril as needed at bedtime 5 mg      Hyperlipidemia   Continue pravastatin 40 mg daily                                 Diet  NPO for procedure     DVT Prophylaxis  SCD     GI Prophylaxis  PPI     Code status  Full        Disposition  TBD           Point of Contact  Jerrica Bulluck   Relationship: god daughter   [phone]          [place]2     [facility]            July 05, 2020, 3:52 PM

## 2020-07-05 NOTE — Op Note (Signed)
Brief Postoperative Note    Patient: Mary Grosso  Date of Birth: 01-25-1966  MRN: [id number]    Date of Procedure: 07/05/2020     Pre-Op Diagnosis: Left Middle Finger Infection    Post-Op Diagnosis: Same as preoperative diagnosis.      Procedure(s):  INCISION AND DRAINAGE RIGHT MIDDLE FINGER    Surgeon(s):  Peggyann Shoals, DO    Surgical Assistant: Surg Asst-1: Minette Headland E    Anesthesia: General     Estimated Blood Loss (mL): Minimal    Complications: None    Specimens:   ID Type Source Tests Collected by Time Destination   1 : rt dorsal finger Wound  CULTURE, ANAEROBIC, CULTURE, WOUND W Anastasio Auerbach, DO 07/05/2020 1903 Microbiology   2 : aerobic, anaerobic c/s of right hand tendon flexor sheath Wound  CULTURE, ANAEROBIC, CULTURE, WOUND W Anastasio Auerbach, DO 07/05/2020 1908 Microbiology        Implants: * No implants in log *    Drains: * No LDAs found *    Findings: purulent blister subcutaneous.  Inflamed flexor tendon sheath with no gross purulence.    Electronically Signed by Peggyann Shoals, DO on 07/05/2020 at 7:30 PM

## 2020-07-05 NOTE — ED Notes (Signed)
Patient seen here yesterday morning, patent with insect bite to right 3rd finger that is involving hand and traveling up wrist, Code sepsis called, Dr Charlett Nose in triage

## 2020-07-05 NOTE — Anesthesia Post-Procedure Evaluation (Signed)
Procedure(s):  INCISION AND DRAINAGE RIGHT MIDDLE FINGER.    general    Anesthesia Post Evaluation      Multimodal analgesia: multimodal analgesia used between 6 hours prior to anesthesia start to PACU discharge  Patient location during evaluation: PACU  Patient participation: complete - patient participated  Level of consciousness: sleepy but conscious  Pain management: adequate  Airway patency: patent  Anesthetic complications: no  Cardiovascular status: acceptable  Respiratory status: acceptable  Hydration status: acceptable  Post anesthesia nausea and vomiting:  controlled  Final Post Anesthesia Temperature Assessment:  Normothermia (36.0-37.5 degrees C)      INITIAL Post-op Vital signs:   Vitals Value Taken Time   BP 133/67 07/05/20 1944   Temp 36.5 ??C (97.7 ??F) 07/05/20 1944   Pulse 87 07/05/20 1944   Resp 15 07/05/20 1944   SpO2 92 % 07/05/20 1944

## 2020-07-05 NOTE — Op Note (Signed)
Operative Report    Patient: Mary [name] MRN: [id number]  SSN: [id number]    Date of Birth: 10-Nov-1965  Age: 55 y.o.  Sex: female       Date of Surgery: 07/05/2020     Preoperative Diagnosis: infected right middle  finger     Postoperative Diagnosis: Infected right middle finger     Surgeon(s) and Role:     Andrey Campanile, Faith Rogue, DO - Primary    Assistant: Burnadette Peter    Anesthesia: General and local    Procedure: Procedure(s):  INCISION AND DRAINAGE RIGHT MIDDLE FINGER     Findings: Purulent drainage from dorsal finger and inflammation about middle finger flexor tendon sheath.    Procedure in Detail:     Indications for procedure been outlined in the perioperative documentation, most notably being not amenable to conservative treatment.    Informed consent was obtained from the patient.  The risks and benefits of the procedure were discussed with the patient.  They include but are not limited to neurovascular injury, tendon/ligamentous injury, blood loss, infection, need for further surgery, hematoma, neuroma, seroma, chronic pain, chronic stiffness, complications from anesthesia including death, and the possibility of contracting Covid.    After informed consent was obtained, the patient was taken back to the operative suite.  A tourniquet was applied to the operative extremity and it was prepped and draped in the normal sterile fashion.  The arm was exsanguinated and the tourniquet was elevated to 250 mmHg.    Attention was turned to the dorsal aspect of the right little finger where there was a very superficial subcutaneous blister.  Incision was made over this and purulent drainage was initially encountered.  Cultures were taken of this drainage.  It appeared that this was just beneath the epidermal layer.  The area was copiously irrigated.  Attention was then turned to the palm of the hand where an incision was made utilizing the distal palmar crease over the A1 pulley of the middle finger.  This was done  due to significant tenderness as well as the other 3 Kanavel signs mentioned on physical exam.  The soft tissues were dissected and electrocautery used for hemostasis.  The A1 pulley was identified and found to be inflamed.  It was released and inflammatory tissue was cultured.  Attention was then turned distally where a small diagonal incision was made just distal to the distal interphalangeal crease.  Subcutaneous tissues were dissected and electrocautery used for hemostasis.  At this point the A5 pulley was identified and partially opened.  A small mosquito hemostat was used to keep it patent.  At this point a basal cannula was used to irrigate the A1 pulley and this was seen to flush through the entire tendon sheath and exit at the level of the A5 pulley.  All wounds were copiously irrigated 1 final time prior to closure.  The dorsal skin had sloughed off during the procedure and was subsequently covered with a Xeroform gauze.  The palmar incisions were closed with 4-0 nylon and also covered with Xeroform.  The patient was placed in a sterile dressing and sent to recovery in stable condition.    Postoperative course: Although there was substantial signs consistent with flexor tenosynovitis of the right middle finger, this was either caught early enough that there was not significant purulence or it was only inflammatory reactive tissue.  This point patient should continue IV antibiotics while awaiting cultures and sent home on postoperative antibiotics per infectious  disease.  Recommend early occupational therapy for early range of motion exercises.      Estimated Blood Loss: Minimal    Tourniquet Time:   Total Tourniquet Time Documented:  Upper Arm (Right) - 19 minutes  Total: Upper Arm (Right) - 19 minutes        Implants: * No implants in log *            Specimens:   ID Type Source Tests Collected by Time Destination   1 : rt dorsal finger Wound  CULTURE, ANAEROBIC, CULTURE, WOUND W Anastasio Auerbach, DO 07/05/2020 1903 Microbiology   2 : aerobic, anaerobic c/s of right hand tendon flexor sheath Wound  CULTURE, ANAEROBIC, CULTURE, WOUND W Anastasio Auerbach, DO 07/05/2020 1908 Microbiology           Drains: None                Complications: None    Counts: Sponge and needle counts were correct times two.    Signed By:  Peggyann Shoals, DO     July 06, 2020

## 2020-07-05 NOTE — Progress Notes (Signed)
Pharmacist Progress note  Pharmacy Warfarin Management Consult provided for this 55 y.o., 90.7 kg (200 lb), female    Initial Warfarin Dose Day 1:  Indication: Atrial Fibrillation   Target INR range: 2.0-3.0    Contraindications, coagulation labs, bleeding risks, potential drug interactions and warfarin sensitivity reviewed.    Assessment/Plan:    Start warfarin at 5 mg as a one-time dose    Pharmacy to follow daily and will provide subsequent warfarin dosing based on clinical status.    Thank you for the consult,  Margit Hanks, [facility]  07/05/2020

## 2020-07-05 NOTE — H&P (Signed)
Update History & Physical    The Patient's Consult of July 06, 2019 was reviewed with the patient and I examined the patient.  There was no change.  The surgical site was confirmed by the patient and me.    Plan:  The risk, benefits, expected outcome, and alternative to the recommended procedure have been discussed with the patient.  Patient understands and wants to proceed with the procedure.    Electronically signed by Peggyann Shoals, DO on 07/05/2020 at 6:31 PM

## 2020-07-05 NOTE — Progress Notes (Signed)
2049 pt in from PACU awake via stretcher no distress or deficit  2134 call pharmacy to confirm the missed warfarin at 6pm, He placed new order 5mg     Bedside and Verbal shift change report given to RN (oncoming nurse) by Julieanne Cotton RN (offgoing nurse). Report included the following information SBAR, Kardex, Intake/Output and MAR.

## 2020-07-05 NOTE — ED Notes (Signed)
Pt brought out of ED, with OR techs, per stretcher, alert awake and oriented x4 with an ongoing IVF of NS

## 2020-07-05 NOTE — Anesthesia Pre-Procedure Evaluation (Signed)
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               Neuro/Psych   Within defined limits           Cardiovascular            Dysrhythmias : atrial fibrillation        Comments: Mitral valve replacement, MAZE, LAE 2017 (on warfarin, INR 1.8 today)   GI/Hepatic/Renal  Within defined limits              Endo/Other  Within defined limits           Other Findings   Comments: Cervical fusion         Physical Exam    Airway  Mallampati: III  TM Distance: 4 - 6 cm  Neck ROM: normal range of motion   Mouth opening: Normal     Cardiovascular    Rhythm: regular  Rate: normal         Dental    Dentition: Edentulous     Pulmonary  Breath sounds clear to auscultation               Abdominal  GI exam deferred       Other Findings            Anesthetic Plan    ASA: 3  Anesthesia type: general          Induction: Intravenous  Anesthetic plan and risks discussed with: Patient

## 2020-07-06 LAB — CBC WITH AUTOMATED DIFF
ABS. BASOPHILS: 0 10*3/uL (ref 0.0–0.1)
ABS. EOSINOPHILS: 0 10*3/uL (ref 0.0–0.4)
ABS. IMM. GRANS.: 0 10*3/uL (ref 0.00–0.04)
ABS. LYMPHOCYTES: 1 10*3/uL (ref 0.9–3.6)
ABS. MONOCYTES: 0.2 10*3/uL (ref 0.05–1.2)
ABS. NEUTROPHILS: 5 10*3/uL (ref 1.8–8.0)
ABSOLUTE NRBC: 0 10*3/uL (ref 0.00–0.01)
BASOPHILS: 0 % (ref 0–2)
EOSINOPHILS: 0 % (ref 0–5)
HCT: 32.2 % — ABNORMAL LOW (ref 35.0–45.0)
HGB: 9.8 g/dL — ABNORMAL LOW (ref 12.0–16.0)
IMMATURE GRANULOCYTES: 1 % — ABNORMAL HIGH (ref 0.0–0.5)
LYMPHOCYTES: 17 % — ABNORMAL LOW (ref 21–52)
MCH: 27.1 PG (ref 24.0–34.0)
MCHC: 30.4 g/dL — ABNORMAL LOW (ref 31.0–37.0)
MCV: 89.2 FL (ref 78.0–100.0)
MONOCYTES: 3 % (ref 3–10)
MPV: 10.3 FL (ref 9.2–11.8)
NEUTROPHILS: 80 % — ABNORMAL HIGH (ref 40–73)
NRBC: 0 PER 100 WBC
PLATELET: 311 10*3/uL (ref 135–420)
RBC: 3.61 M/uL — ABNORMAL LOW (ref 4.20–5.30)
RDW: 14.1 % (ref 11.6–14.5)
WBC: 6.3 10*3/uL (ref 4.6–13.2)

## 2020-07-06 LAB — METABOLIC PANEL, BASIC
Anion gap: 5 mmol/L (ref 3.0–18)
BUN/Creatinine ratio: 19 (ref 12–20)
BUN: 15 MG/DL (ref 7.0–18)
CO2: 20 mmol/L — ABNORMAL LOW (ref 21–32)
Calcium: 8.6 MG/DL (ref 8.5–10.1)
Chloride: 111 mmol/L (ref 100–111)
Creatinine: 0.79 MG/DL (ref 0.6–1.3)
GFR est AA: >60 mL/min/{1.73_m2} (ref >60)
GFR est non-AA: >60 mL/min/{1.73_m2} (ref >60)
Glucose: 106 mg/dL — ABNORMAL HIGH (ref 74–99)
Sodium: 136 mmol/L (ref 136–145)

## 2020-07-06 LAB — EKG, 12 LEAD, INITIAL
Atrial Rate: 82 {beats}/min
Calculated P Axis: 87 degrees
Calculated R Axis: 29 degrees
Calculated T Axis: 59 degrees
Diagnosis: NORMAL
P-R Interval: 126 ms
Q-T Interval: 390 ms
QRS Duration: 102 ms
QTC Calculation (Bezet): 455 ms
Ventricular Rate: 82 {beats}/min

## 2020-07-06 LAB — PROTHROMBIN TIME + INR
INR: 2 — ABNORMAL HIGH (ref 0.8–1.2)
Prothrombin time: 22.2 s — ABNORMAL HIGH (ref 11.5–15.2)

## 2020-07-06 LAB — VANCOMYCIN, RANDOM: Vancomycin, random: 21.1 UG/ML (ref 5.0–40.0)

## 2020-07-06 LAB — POTASSIUM
Potassium: 4.4 mmol/L (ref 3.5–5.5)
Potassium: 4.4 mmol/L (ref 3.5–5.5)

## 2020-07-06 LAB — EKG 12-LEAD
Atrial Rate: 82 {beats}/min
Diagnosis: NORMAL
P Axis: 87 degrees
P-R Interval: 126 ms
Q-T Interval: 390 ms
QRS Duration: 102 ms
QTc Calculation (Bazett): 455 ms
R Axis: 29 degrees
T Axis: 59 degrees
Ventricular Rate: 82 {beats}/min

## 2020-07-06 LAB — CBC WITH AUTO DIFFERENTIAL
Basophils %: 0 % (ref 0–2)
Basophils Absolute: 0 10*3/uL (ref 0.0–0.1)
Eosinophils %: 0 % (ref 0–5)
Eosinophils Absolute: 0 10*3/uL (ref 0.0–0.4)
Granulocyte Absolute Count: 0 10*3/uL (ref 0.00–0.04)
Hematocrit: 32.2 % — ABNORMAL LOW (ref 35.0–45.0)
Hemoglobin: 9.8 g/dL — ABNORMAL LOW (ref 12.0–16.0)
Immature Granulocytes: 1 % — ABNORMAL HIGH (ref 0.0–0.5)
Lymphocytes %: 17 % — ABNORMAL LOW (ref 21–52)
Lymphocytes Absolute: 1 10*3/uL (ref 0.9–3.6)
MCH: 27.1 PG (ref 24.0–34.0)
MCHC: 30.4 g/dL — ABNORMAL LOW (ref 31.0–37.0)
MCV: 89.2 FL (ref 78.0–100.0)
MPV: 10.3 FL (ref 9.2–11.8)
Monocytes %: 3 % (ref 3–10)
Monocytes Absolute: 0.2 10*3/uL (ref 0.05–1.2)
NRBC Absolute: 0 10*3/uL (ref 0.00–0.01)
Neutrophils %: 80 % — ABNORMAL HIGH (ref 40–73)
Neutrophils Absolute: 5 10*3/uL (ref 1.8–8.0)
Nucleated RBCs: 0 PER 100 WBC
Platelets: 311 10*3/uL (ref 135–420)
RBC: 3.61 M/uL — ABNORMAL LOW (ref 4.20–5.30)
RDW: 14.1 % (ref 11.6–14.5)
WBC: 6.3 10*3/uL (ref 4.6–13.2)

## 2020-07-06 LAB — BASIC METABOLIC PANEL
Anion Gap: 5 mmol/L (ref 3.0–18)
BUN: 15 MG/DL (ref 7.0–18)
Bun/Cre Ratio: 19 (ref 12–20)
CO2: 20 mmol/L — ABNORMAL LOW (ref 21–32)
Calcium: 8.6 MG/DL (ref 8.5–10.1)
Chloride: 111 mmol/L (ref 100–111)
Creatinine: 0.79 MG/DL (ref 0.6–1.3)
EGFR IF NonAfrican American: >60 mL/min/{1.73_m2} (ref >60)
GFR African American: >60 mL/min/{1.73_m2} (ref >60)
Glucose: 106 mg/dL — ABNORMAL HIGH (ref 74–99)
Sodium: 136 mmol/L (ref 136–145)

## 2020-07-06 LAB — PROTIME-INR
INR: 2 — ABNORMAL HIGH (ref 0.8–1.2)
Protime: 22.2 s — ABNORMAL HIGH (ref 11.5–15.2)

## 2020-07-06 LAB — VANCOMYCIN LEVEL, RANDOM: Vancomycin Rm: 21.1 UG/ML (ref 5.0–40.0)

## 2020-07-06 MED ORDER — BUPIVACAINE (PF) 0.25 % (2.5 MG/ML) IJ SOLN
0.25 % (2.5 mg/mL) | INTRAMUSCULAR | Status: DC | PRN
Start: 2020-07-06 — End: 2020-07-05
  Administered 2020-07-06

## 2020-07-06 MED ORDER — KETOROLAC TROMETHAMINE 15 MG/ML INJECTION
15 mg/mL | INTRAMUSCULAR | Status: DC | PRN
Start: 2020-07-06 — End: 2020-07-05
  Administered 2020-07-06: via INTRAVENOUS

## 2020-07-06 MED ORDER — PROCHLORPERAZINE EDISYLATE 5 MG/ML INJECTION
5 mg/mL | Freq: Once | INTRAMUSCULAR | Status: DC
Start: 2020-07-06 — End: 2020-07-05

## 2020-07-06 MED ORDER — HYDROMORPHONE 2 MG/ML INJECTION SOLUTION
2 mg/mL | INTRAMUSCULAR | Status: DC | PRN
Start: 2020-07-06 — End: 2020-07-05

## 2020-07-06 MED ORDER — OXYCODONE-ACETAMINOPHEN 7.5 MG-325 MG TAB
ORAL | Status: DC
Start: 2020-07-06 — End: 2020-07-06

## 2020-07-06 MED ORDER — WARFARIN 5 MG TAB
5 mg | Freq: Once | ORAL | Status: AC
Start: 2020-07-06 — End: 2020-07-06
  Administered 2020-07-06: 23:00:00 via ORAL

## 2020-07-06 MED ORDER — PHARMACY WARFARIN NOTE
Status: DC
Start: 2020-07-06 — End: 2020-07-07

## 2020-07-06 MED ORDER — LACTATED RINGERS IV
INTRAVENOUS | Status: DC
Start: 2020-07-06 — End: 2020-07-05

## 2020-07-06 MED ORDER — WARFARIN 5 MG TAB
5 mg | Freq: Once | ORAL | Status: AC
Start: 2020-07-06 — End: 2020-07-05
  Administered 2020-07-06: 03:00:00 via ORAL

## 2020-07-06 MED ORDER — NICOTINE 14 MG/24 HR DAILY PATCH
14 mg/24 hr | Freq: Every day | TRANSDERMAL | Status: DC
Start: 2020-07-06 — End: 2020-07-07

## 2020-07-06 MED ORDER — FENTANYL CITRATE (PF) 50 MCG/ML IJ SOLN
50 mcg/mL | INTRAMUSCULAR | Status: DC | PRN
Start: 2020-07-06 — End: 2020-07-05

## 2020-07-06 MED ORDER — OXYCODONE-ACETAMINOPHEN 10 MG-325 MG TAB
10-325 mg | Freq: Four times a day (QID) | ORAL | Status: DC | PRN
Start: 2020-07-06 — End: 2020-07-06
  Administered 2020-07-06 (×3): via ORAL

## 2020-07-06 MED ORDER — OXYCODONE-ACETAMINOPHEN 5 MG-325 MG TAB
5-325 mg | Freq: Four times a day (QID) | ORAL | Status: DC | PRN
Start: 2020-07-06 — End: 2020-07-06

## 2020-07-06 MED ORDER — SODIUM CHLORIDE 0.9 % IV
INTRAVENOUS | Status: DC | PRN
Start: 2020-07-06 — End: 2020-07-05
  Administered 2020-07-05: via INTRAVENOUS

## 2020-07-06 MED ORDER — OXYCODONE-ACETAMINOPHEN 5 MG-325 MG TAB
5-325 mg | ORAL | Status: DC | PRN
Start: 2020-07-06 — End: 2020-07-07
  Administered 2020-07-06 – 2020-07-07 (×5): via ORAL

## 2020-07-06 MED FILL — OXYCODONE-ACETAMINOPHEN 10 MG-325 MG TAB: 10-325 mg | ORAL | Qty: 2

## 2020-07-06 MED FILL — PREGABALIN 50 MG CAP: 50 mg | ORAL | Qty: 1

## 2020-07-06 MED FILL — WARFARIN 5 MG TAB: 5 mg | ORAL | Qty: 1

## 2020-07-06 MED FILL — NICOTINE 14 MG/24 HR DAILY PATCH: 14 mg/24 hr | TRANSDERMAL | Qty: 1

## 2020-07-06 MED FILL — PANTOPRAZOLE 40 MG TAB, DELAYED RELEASE: 40 mg | ORAL | Qty: 1

## 2020-07-06 MED FILL — PRAVASTATIN 20 MG TAB: 20 mg | ORAL | Qty: 2

## 2020-07-06 MED FILL — LACTATED RINGERS IV: INTRAVENOUS | Qty: 1000

## 2020-07-06 MED FILL — CETIRIZINE 10 MG TAB: 10 mg | ORAL | Qty: 1

## 2020-07-06 MED FILL — FLUTICASONE 50 MCG/ACTUATION NASAL SPRAY, SUSP: 50 mcg/actuation | NASAL | Qty: 16

## 2020-07-06 MED FILL — CEFTRIAXONE 1 GRAM SOLUTION FOR INJECTION: 1 gram | INTRAMUSCULAR | Qty: 1

## 2020-07-06 MED FILL — OXYCODONE-ACETAMINOPHEN 5 MG-325 MG TAB: 5-325 mg | ORAL | Qty: 1

## 2020-07-06 MED FILL — VANCOMYCIN 1,000 MG IV SOLR: 1000 mg | INTRAVENOUS | Qty: 1000

## 2020-07-06 MED FILL — OXYCODONE-ACETAMINOPHEN 10 MG-325 MG TAB: 10-325 mg | ORAL | Qty: 1

## 2020-07-06 MED FILL — LAMOTRIGINE 25 MG TAB: 25 mg | ORAL | Qty: 3

## 2020-07-06 MED FILL — HYDROXYZINE 25 MG TAB: 25 mg | ORAL | Qty: 1

## 2020-07-06 MED FILL — PHARMACY WARFARIN NOTE: Qty: 1

## 2020-07-06 NOTE — Consults (Signed)
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    Hospitalization December 2021 for acute hypoxia-present on admission due to COVID-19 pneumonia     Clinical presentation consistent with right middle finger abscess  Status post I&D on 07/05/2020.  Intra-Op cultures pending    Ortho consult appreciated.  Intra-Op findings discussed with Ortho. Minimal purulent drainage from dorsal finger and inflammation about middle finger flexor tendon sheath.  ??  Recommendations:  ??  1.    Continue ceftriaxone, vancomycin for now  2.     Follow-up Intra-Op cultures and modify device accordingly-prior outpatient antibiotics may affect the culture results  3.  Follow-up Ortho surgery recommendations  4.  Monitor right index finger pain-discussed with Ortho.  Currently no need for surgical intervention right index finger  5. Recommend to follow cultures to make appropriate antibiotic decision since patient has prosthetic mitral valve & risk of complicated infection.     Thank you for consultation request. Above plan was discussed in details with patient, dr. Andrey Campanile and dr Lester Kinsman. Please call me if any further questions or concerns. Will continue to participate in the care of this patient.  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  She states that she is emotional because her dog died several days ago.  She developed a small red bump on her right middle finger when she was trying to fix his grave few days ago.  She initially came to the emergency room on 07/04/2020.  She was prescribed Bactrim, Keflex.  She took couple doses of these antibiotics.  Subsequently next day she noted a blood blister on her finger and came to the emergency room again.  She was evaluated by Ortho surgery and decision was made to perform I&D.  She underwent I&D right middle finger on 07/05/2020.  Some purulence was noted intraoperatively.  Intra-Op cultures pending.  I have been consulted for further recommendations.    Patient denies any fever or chills at home.  No prior history of MRSA colonization and infection.  She is currently concerned about pain in the right index finger.      Past Medical History:   Diagnosis Date   ??? Anxiety    ??? Bipolar 1 disorder (HCC)    ??? CHF (congestive heart failure) (HCC)    ??? COPD (chronic obstructive pulmonary disease) (HCC)    ??? COPD (chronic obstructive pulmonary disease) (HCC)    ??? H/O aortic valve replacement    ??? PTSD (post-traumatic stress disorder)        Past Surgical History:   Procedure Laterality Date   ??? HX HEART CATHETERIZATION     ??? HX HEART VALVE SURGERY      mitral valve replacement       Current Discharge Medication List      CONTINUE these  medications which have NOT CHANGED    Details   aspirin 81 mg chewable tablet Take 81 mg by mouth daily.      furosemide (Lasix) 40 mg tablet Take 40 mg by mouth daily as needed (swelling).      cephALEXin (Keflex) 500 mg capsule Take 1 Capsule by mouth four (4) times daily for 7 days.  Qty: 28 Capsule, Refills: 0      trimethoprim-sulfamethoxazole (Bactrim DS) 160-800 mg per tablet Take 1 Tablet by mouth two (2) times a day for 7 days.  Qty: 14 Tablet, Refills: 0      pregabalin (LYRICA) 200 mg capsule Take 1 Capsule by mouth three (3) times daily.  Qty: 90  Capsule, Refills: 2    Associated Diagnoses: Chronic pain syndrome      ipratropium-albuteroL (Combivent Respimat) 20-100 mcg/actuation inhaler Take 1 Puff by inhalation every six (6) hours.  Qty: 4 g, Refills: 3      fluticasone propionate (FLONASE) 50 mcg/actuation nasal spray 1 spray by nasal route daily  Qty: 1 Each, Refills: 2      hydrOXYzine HCL (ATARAX) 25 mg tablet Take 25 mg by mouth two (2) times daily as needed for Anxiety.      acetaminophen (Tylenol Extra Strength) 500 mg tablet Take 1,000 mg by mouth every eight (8) hours as needed for Pain.      albuterol (PROVENTIL HFA, VENTOLIN HFA, PROAIR HFA) 90 mcg/actuation inhaler Take 2 Puffs by inhalation every six (6) hours as needed.      cetirizine (ZYRTEC) 10 mg tablet Take 10 mg by mouth daily.      lamoTRIgine (LaMICtal) 25 mg tablet 75 mg nightly.      omeprazole (PRILOSEC) 40 mg capsule Take 40 mg by mouth daily.      warfarin (COUMADIN) 2.5 mg tablet 2.5 mg. Takes 5mg  MWF and takes 3.75 mg Tue, Thrs, Sat, Sunday      cyclobenzaprine (FLEXERIL) 5 mg tablet Take 1 Tablet by mouth nightly as needed for Muscle Spasm(s).  Qty: 30 Tablet, Refills: 1    Associated Diagnoses: Chronic pain syndrome      metoprolol succinate (TOPROL-XL) 25 mg XL tablet Take 25 mg by mouth daily.      pravastatin (PRAVACHOL) 40 mg tablet Take 40 mg by mouth nightly.               Current Facility-Administered Medications   Medication Dose Route Frequency   ??? warfarin (COUMADIN) tablet 5 mg  5 mg Oral ONCE   ??? oxyCODONE-acetaminophen (PERCOCET) 5-325 mg per tablet 1 Tablet  1 Tablet Oral Q6H PRN   ??? nicotine (NICODERM CQ) 14 mg/24 hr patch 1 Patch  1 Patch TransDERmal DAILY   ??? sodium chloride (NS) flush 5-10 mL  5-10 mL IntraVENous PRN   ??? cefTRIAXone (ROCEPHIN) 1 g in sterile water (preservative free) 10 mL IV syringe  1 g IntraVENous Q24H   ??? vancomycin (VANCOCIN) 1,000 mg in 0.9% sodium chloride 250 mL (VIAL-MATE)  1,000 mg IntraVENous Q12H   ??? fluticasone propionate  (FLONASE) 50 mcg/actuation nasal spray 2 Spray  2 Spray Both Nostrils DAILY   ??? furosemide (LASIX) tablet 40 mg  40 mg Oral DAILY PRN   ??? hydrOXYzine HCL (ATARAX) tablet 25 mg  25 mg Oral BID PRN   ??? lamoTRIgine (LaMICtal) tablet 75 mg  75 mg Oral QHS   ??? cetirizine (ZYRTEC) tablet 10 mg  10 mg Oral DAILY   ??? cyclobenzaprine (FLEXERIL) tablet  5 mg  5 mg Oral QHS PRN   ??? pantoprazole (PROTONIX) tablet 40 mg  40 mg Oral DAILY   ??? pravastatin (PRAVACHOL) tablet 40 mg  40 mg Oral QHS   ??? pregabalin (LYRICA) capsule 200 mg  200 mg Oral TID   ??? sodium chloride (NS) flush 5-40 mL  5-40 mL IntraVENous Q8H   ??? sodium chloride (NS) flush 5-40 mL  5-40 mL IntraVENous PRN   ??? polyethylene glycol (MIRALAX) packet 17 g  17 g Oral DAILY PRN   ??? WARFARIN;  Pharmacy Dosing   Other Q24H       Allergies: Adhesive and Lithium    Family History   Problem Relation Age of Onset   ??? No Known Problems Mother      Social History     Socioeconomic History   ??? Marital status: DIVORCED     Spouse name: Not on file   ??? Number of children: Not on file   ??? Years of education: Not on file   ??? Highest education level: Not on file   Occupational History   ??? Not on file   Tobacco Use   ??? Smoking status: Current Every Day Smoker     Packs/day: 1.00   ??? Smokeless tobacco: Never Used   Substance and Sexual Activity   ??? Alcohol use: Never   ??? Drug use: Yes     Types: Marijuana   ??? Sexual activity: Not on file   Other Topics Concern   ??? Not on file   Social History Narrative   ??? Not on file     Social Determinants of Health     Financial Resource Strain:    ??? Difficulty of Paying Living Expenses: Not on file   Food Insecurity:    ??? Worried About Running Out of Food in the Last Year: Not on file   ??? Ran Out of Food in the Last Year: Not on file   Transportation Needs:    ??? Lack of Transportation (Medical): Not on file   ??? Lack of Transportation (Non-Medical): Not on file   Physical Activity:    ??? Days of Exercise per Week: Not on file   ??? Minutes of  Exercise per Session: Not on file   Stress:    ??? Feeling of Stress : Not on file   Social Connections:    ??? Frequency of Communication with Friends and Family: Not on file   ??? Frequency of Social Gatherings with Friends and Family: Not on file   ??? Attends Religious Services: Not on file   ??? Active Member of Clubs or Organizations: Not on file   ??? Attends Banker Meetings: Not on file   ??? Marital Status: Not on file   Intimate Partner Violence:    ??? Fear of Current or Ex-Partner: Not on file   ??? Emotionally Abused: Not on file   ??? Physically Abused: Not on file   ??? Sexually Abused: Not on file   Housing Stability:    ??? Unable to Pay for Housing in the Last Year: Not on file   ??? Number of Places Lived in the Last Year: Not on file   ??? Unstable Housing in the Last Year: Not on file     Social History     Tobacco Use   Smoking Status Current Every Day Smoker   ??? Packs/day: 1.00   Smokeless Tobacco Never Used  Temp (24hrs), Avg:98.2 ??F (36.8 ??C), Min:97.6 ??F (36.4 ??C), Max:99.1 ??F (37.3 ??C)    Visit Vitals  BP 124/72 (BP 1 Location: Left upper arm, BP Patient Position: At rest)   Pulse 74   Temp 99.1 ??F (37.3 ??C)   Resp 18   Ht 5\' 3"  (1.6 m)   Wt 90.7 kg (200 lb)   SpO2 93%   BMI 35.43 kg/m??       ROS: 12 point ROS obtained in details. Pertinent positives as mentioned in HPI,   otherwise negative    Physical Exam:      Vitals signs??and nursing note??reviewed.   Constitutional: ??  Sitting on bed.  Alert awake oriented x3, appears more comfortable than prior exam  HENT:   ??????Head: Normocephalic.   Eyes:   ??????Conjunctiva/sclera: Conjunctivae normal.   ??????Neck:   ??????Musculoskeletal: Normal range of motion??and neck supple.   Cardiovascular:   ??????Rate and Rhythm: Normal rate??and regular rhythm on monitor  Chest:   ??????Bilateral chest movements equal.    Abdominal:   ??????General: There is no??distension.   ??????Palpations: Abdomen is soft.   ??????Tenderness: There is no abdominal tenderness. There is no??rebound.    Musculoskeletal:??Normal range of motion. ??????  Right middle finger surgical dressing not removed.  Darkish discoloration medial aspect of distal phalanx of right index finger with overlying tenderness-no fluctuant  Skin:  ??????General: Skin is warm??and dry.   ??????Findings: No rash.   Neurological:   ??????Mental Status:  alert??and oriented to person, place, and time.   ??????Cranial Nerves: No cranial nerve deficit.   ??????Motor: No abnormal muscle tone.   ??????Coordination: Coordination normal.   Psychiatric: ??????  ????emotionally labile, crying episodes  Labs: Results:   Chemistry Recent Labs     07/06/20  0923 07/06/20  0400 07/05/20  1253   GLU  --  106* 94   NA  --  136 136   K 4.4 HEMOLYZED,RECOLLECT REQUESTED 4.0   CL  --  111 106   CO2  --  20* 23   BUN  --  15 9   CREA  --  0.79 0.86   CA  --  8.6 9.3   AGAP  --  5 7   BUCR  --  19 10*   AP  --   --  69   TP  --   --  8.0   ALB  --   --  3.4   GLOB  --   --  4.6*   AGRAT  --   --  0.7*      CBC w/Diff Recent Labs     07/06/20  0400 07/05/20  1253   WBC 6.3 10.3   RBC 3.61* 4.39   HGB 9.8* 11.4*   HCT 32.2* 37.3   PLT 311 356   GRANS 80* 75*   LYMPH 17* 18*   EOS 0 1      Microbiology Recent Labs     07/05/20  1915 07/05/20  1908 07/05/20  1250 07/05/20  1205   CULT PENDING PENDING NO GROWTH AFTER 17 HOURS NO GROWTH AFTER 17 HOURS          RADIOLOGY:    All available imaging studies/reports in connect care for this admission were reviewed      Disclaimer: Sections of this note are dictated utilizing voice recognition software, which may have resulted in some phonetic based errors in grammar and contents. Even though attempts were made to correct  all the mistakes, some may have been missed, and remained in the body of the document. If questions arise, please contact our department.    Dr. Raiford Simmonds, Infectious Disease Specialist  [phone]  July 06, 2020  11:41 AM

## 2020-07-06 NOTE — Progress Notes (Signed)
OT orders received and medical chart review completed.   Pt reports she is at baseline as PLOF with ADLs and functional mobility to the bathroom independently. Pt states that she wants to discharge home and has pain right hand 7/10-nursing notified. Formal OT evaluation not indicated. OT to sign off.

## 2020-07-06 NOTE — Progress Notes (Signed)
Pharmacist Consult: Warfarin Management  Assessment/Plan  INR is 2.0 currently therapeutic  Will order 5 mg warfarin today.   Recheck INR daily and adjust dose accordingly. Will continue to follow INR trend and patient's clinical progress daily.     Subjective/Objective  Patient on warfarin therapy for Atrial Fibrillation  Target goal INR of 2-3  Patient has been on warfarin PTA (5 mg Mon/Wed/Fri, 3.75 mg Tue,Thurs/Sat/Sun)    Date 2/21 2/20          INR 2 1.8          Warfarin Dose (mg) 5 mg 5 mg              Bleeding/Sensitivity Risk Factors:  Potentially interacting medications include:   Drugs that may increase INR: none  Drugs that may decrease INR: none  Other anticoagulants include: NA      Labs:  COAGS:    Lab Results   Component Value Date/Time    PTP 22.2 (H) 07/06/2020 04:00 AM    INR 2.0 (H) 07/06/2020 04:00 AM     CBC:    Lab Results   Component Value Date/Time    WBC 6.3 07/06/2020 04:00 AM    HGB 9.8 (L) 07/06/2020 04:00 AM    HCT 32.2 (L) 07/06/2020 04:00 AM    PLT 311 07/06/2020 04:00 AM       Estimated Creatinine Clearance: 88 mL/min (based on SCr of 0.79 mg/dL).    No results found for: OB1, OB2, OB3, OCBL, POCOBL, SOB, OCCBLDEXT    Labs reviewed. Assessment performed for missed warfarin doses, new drug interactions, dietary intake or supplement changes, documentation of bleeding and other changes that may affect the INR.    Plan:    Warfarin at 5 mg as a one-time dose    Pharmacy to follow daily and will provide subsequent warfarin dosing based on clinical status.    Thank you for the consult,  Rudene Christians, [facility]  07/06/2020

## 2020-07-06 NOTE — Progress Notes (Signed)
Physician Progress Note      PATIENT:               IRISH, BREISCH  CSN #:                  [id number]  DOB:                       August 05, 1965  ADMIT DATE:       07/05/2020 12:42 PM  DISCH DATE:  RESPONDING  PROVIDER #:        Marcie Mowers MD          QUERY TEXT:    Pt admitted with Cellulitis and abscess right hand and has CHF documented. If possible, please document in progress notes and discharge summary further specificity regarding the type and acuity of CHF:    The medical record reflects the following:  Risk Factors: 55 yo female with PMH heart failure, COPD    Clinical Indicators: Per H&P CHF    Home med list include Lasix 40mg  daily    ECHO 05/04/20 Left ventricle size is normal. Mildly increased wall thickness. See diagram for wall motion findings. EF by visual approximation is 50%.    Treatment: Current  meds includes Lasix 40mg  daily, telemetry monitoring, I&O q 8 hours      Thank you,  05/06/20 RN, CDI  [facility]  [phone]  [phone]  Options provided:  -- Chronic Systolic CHF/HFrEF  -- Chronic Diastolic CHF/HFpEF  -- Chronic Systolic and Diastolic CHF  -- Other - I will add my own diagnosis  -- Disagree - Not applicable / Not valid  -- Disagree - Clinically unable to determine / Unknown  -- Refer to Clinical Documentation Reviewer    PROVIDER RESPONSE TEXT:    Provider is clinically unable to determine a response to this query.    Query created by: IFO on 07/06/2020 11:25 AM      Electronically signed by:  Rana Snare MD 07/06/2020 2:27 PM

## 2020-07-06 NOTE — Progress Notes (Signed)
 [facility]   Pharmacy Pharmacokinetic Monitoring Service - Vancomycin    Consulting Provider: Charlestine Collar  Indication: SSTI  Target Concentration: Goal AUC/MIC 400-600 mg*hr/L  Day of Therapy: 2  Additional Antimicrobials: Ceftriaxone     Pertinent Laboratory Values:   Wt Readings from Last 1 Encounters:   07/05/20 90.7 kg (200 lb)     Temp Readings from Last 1 Encounters:   07/06/20 99.1 F (37.3 C)     No components found for: PROCAL  Estimated Creatinine Clearance: 88 mL/min (based on SCr of 0.79 mg/dL).  Recent Labs     07/06/20  0400 07/05/20  1253   WBC 6.3 10.3       Pertinent Cultures:  Culture Date Source Results   2/20  2/20 Blood  hand NGTD  Pending   MRSA Nasal Swab: N/A. Non-respiratory infection..    @VANCOMARADMINS @    Assessment:  Date/Time Current Dose Concentration Timing of Concentration (h) AUC   2/21 1000 mg IV Q12hr 21.1 3.5 hours post dose 464   Note: Serum concentrations collected for AUC dosing may appear elevated if collected in close proximity to the dose administered, this is not necessarily an indication of toxicity    Plan:  Current dosing regimen is therapeutic  Continue current dose  Repeat vancomycin concentration ordered for 2/23 @ 0400  Pharmacy will continue to monitor patient and adjust therapy as indicated    Thank you for the consult,  Katheryn Jules, [facility]  07/06/2020 9:10 AM

## 2020-07-06 NOTE — Progress Notes (Signed)
 Chaplain conducted an initial consultation and Spiritual Assessment for Mary [name], who is a 55 y.o.,female. Patient's Primary Language is: [place].   According to the patient's EMR Religious Affiliation is: No preference.     The reason the Patient came to the hospital is:   Patient Active Problem List    Diagnosis Date Noted   . Cellulitis and abscess of hand 07/05/2020   . Abscess of finger 07/05/2020   . Suppurative tenosynovitis of flexor tendon of right hand 07/05/2020   . Abrasion of right middle finger with infection 07/05/2020   . Chronic pain syndrome 06/03/2020   . Respiratory failure (HCC) 05/03/2020   . COVID-19 05/03/2020   . Mitral regurgitation 04/23/2020        The Chaplain provided the following Interventions:  Initiated a relationship of care and support.   Explored issues of faith, belief, spirituality and religious/ritual needs while hospitalized.  Listened empathically. Patient shared her emotional pain of losing her emotional therapy dog three weeks ago today. She was her companion for thirteen years and the loss of her mom in Jan 2022. Coping skills were reviewed and reinforced. She said she probably won't have another pet but she keeps her dog's little teddy bear with her when she sleeps.  Provided chaplaincy education.  Provided information about Spiritual Care Services.  Offered prayer and assurance of continued prayers on patient's behalf.   Chart reviewed.    The following outcomes where achieved:  Patient shared limited information about both her medical narrative and spiritual journey/beliefs.  Chaplain confirmed Patient's Religious Affiliation.  Patient processed feeling about current hospitalization.  Patient expressed gratitude for chaplain's visit.    Assessment:  Patient does not have any religious/cultural needs that will affect patient's preferences in health care.  There are no spiritual or religious issues which require intervention at this time.     Plan:  Chaplains will  continue to follow and will provide pastoral care on an as needed/requested basis.  Chaplain recommends bedside caregivers page chaplain on duty if patient shows signs of acute spiritual or emotional distress.    Chaplain Ping Camano, [facility]  Spiritual Care   [phone]

## 2020-07-06 NOTE — Progress Notes (Signed)
Progress Notes by Fara Olden, MD at 07/06/20 1036                Author: Fara Olden, MD  Service: FAMILY MEDICINE  Author Type: Resident       Filed: 07/06/20 1146  Date of Service: 07/06/20 1036  Status: Attested           Editor: Fara Olden, MD (Resident)  Cosigner: Enid Cutter, MD at 07/06/20 Bosie Helper          Attestation signed by Enid Cutter, MD at 07/06/20 Bosie Helper          I have seen and examined the patient.  I reviewed the resident's note and agree with assessment & plan below.  In addition, see H&P.                                           Intern Progress Note   [facility]              Patient: Mary [name]  MRN: [id number]   CSN: [id number]          Date of Birth: Oct 12, 1965   Age: 55 y.o.   Sex: female          DOA: 07/05/2020  LOS:  LOS: 1 day                          Subjective:            Acute events: NAEO.      Patient resting in reclining arm chair without NC, preparing to eat breakfast this AM. States she feels well and would like to go home. Reports that she has mild pain in her R arm following her procedure yesterday; pain controlled with medications.       ROS Denies fever/chills, SOB, abd pain.        Objective:        Patient Vitals for the past 24 hrs:            Temp  Pulse  Resp  BP  SpO2            07/06/20 0827  99.1 ??F (37.3 ??C)  74  18  124/72  93 %            07/06/20 0411  98.5 ??F (36.9 ??C)  77  17  105/72  95 %     07/05/20 2348  98.4 ??F (36.9 ??C)  78  17  110/66  94 %     07/05/20 2108  98.2 ??F (36.8 ??C)  80  17  133/63  92 %     07/05/20 2041  --  --  --  --  94 %     07/05/20 2036  --  81  17  --  93 %     07/05/20 2034  --  83  19  117/63  93 %     07/05/20 2033  --  85  17  --  93 %     07/05/20 2030  --  86  21  --  93 %     07/05/20 2023  97.6 ??F (36.4 ??C)  81  20  (!) 116/59  93 %     07/05/20 2020  --  80  20  --  94 %     07/05/20 2015  --  83  18  --  93 %     07/05/20 2014  --  84  17  117/64  93 %     07/05/20 2010  --   83  18  --  93 %     07/05/20 2003  --  83  19  120/62  93 %     07/05/20 1958  --  82  15  121/63  96 %     07/05/20 1945  97.6 ??F (36.4 ??C)  88  16  133/67  94 %     07/05/20 1944  97.7 ??F (36.5 ??C)  87  15  133/67  92 %     07/05/20 1826  98.6 ??F (37 ??C)  72  20  124/71  94 %            07/05/20 1237  98.5 ??F (36.9 ??C)  84  18  122/79  97 %              Intake/Output Summary (Last 24 hours) at 07/06/2020 1045   Last data filed at 07/06/2020 0412     Gross per 24 hour        Intake  2090 ml        Output  500 ml        Net  1590 ml           Physical Exam:    General: Well-appearing, NAD.    CV:  RRR, no M/G/R.   RESP: Unlabored breathing. Lungs CTAB, no wheezes, rales or rhonchi appreciated. Equal expansion bilaterally.    ABD:  Soft, nontender, nondistended.    Ext: R hand bandaged, c/d/i without drainage. R forearm and fingers erythematous, mildly edematous. Motor function and sensation intact in  R hand.   Psych: Normal mood and affect.       Lab/Data Reviewed:     Recent Results (from the past 24 hour(s))     CULTURE, BLOOD          Collection Time: 07/05/20 12:05 PM       Specimen: Blood         Result  Value  Ref Range            Special Requests:  NO SPECIAL REQUESTS          Culture result:  NO GROWTH AFTER 17 HOURS          CULTURE, BLOOD          Collection Time: 07/05/20 12:50 PM       Specimen: Blood         Result  Value  Ref Range            Special Requests:  NO SPECIAL REQUESTS          Culture result:  NO GROWTH AFTER 17 HOURS          EKG, 12 LEAD, INITIAL          Collection Time: 07/05/20 12:50 PM         Result  Value  Ref Range            Ventricular Rate  82  BPM       Atrial Rate  82  BPM       P-R Interval  126  ms       QRS Duration  102  ms  Q-T Interval  390  ms       QTC Calculation (Bezet)  455  ms       Calculated P Axis  87  degrees       Calculated R Axis  29  degrees       Calculated T Axis  59  degrees       Diagnosis                 Normal sinus rhythm   Minimal voltage  criteria for LVH, may be normal variant ( Sokolow-Lyon )   Nonspecific ST and T wave abnormality   Abnormal ECG   When compared with ECG of 30-May-2020 12:08,   No significant change was found   Confirmed by Berna Spare MD, --- ([id number]) on 07/06/2020 8:11:25 AM          POC LACTIC ACID          Collection Time: 07/05/20 12:52 PM         Result  Value  Ref Range            Lactic Acid (POC)  0.69  0.40 - 2.00 mmol/L       CBC WITH AUTOMATED DIFF          Collection Time: 07/05/20 12:53 PM         Result  Value  Ref Range            WBC  10.3  4.6 - 13.2 K/uL       RBC  4.39  4.20 - 5.30 M/uL       HGB  11.4 (L)  12.0 - 16.0 g/dL       HCT  37.3  35.0 - 45.0 %       MCV  85.0  78.0 - 100.0 FL       MCH  26.0  24.0 - 34.0 PG       MCHC  30.6 (L)  31.0 - 37.0 g/dL       RDW  14.1  11.6 - 14.5 %       PLATELET  356  135 - 420 K/uL       MPV  9.5  9.2 - 11.8 FL       NRBC  0.0  0 PER 100 WBC       ABSOLUTE NRBC  0.00  0.00 - 0.01 K/uL       NEUTROPHILS  75 (H)  40 - 73 %       LYMPHOCYTES  18 (L)  21 - 52 %       MONOCYTES  6  3 - 10 %       EOSINOPHILS  1  0 - 5 %       BASOPHILS  1  0 - 2 %       IMMATURE GRANULOCYTES  0  0.0 - 0.5 %       ABS. NEUTROPHILS  7.7  1.8 - 8.0 K/UL       ABS. LYMPHOCYTES  1.8  0.9 - 3.6 K/UL       ABS. MONOCYTES  0.6  0.05 - 1.2 K/UL       ABS. EOSINOPHILS  0.1  0.0 - 0.4 K/UL       ABS. BASOPHILS  0.1  0.0 - 0.1 K/UL       ABS. IMM. GRANS.  0.0  0.00 - 0.04 K/UL       DF  AUTOMATED          METABOLIC PANEL, COMPREHENSIVE          Collection Time: 07/05/20 12:53 PM         Result  Value  Ref Range            Sodium  136  136 - 145 mmol/L       Potassium  4.0  3.5 - 5.5 mmol/L       Chloride  106  100 - 111 mmol/L       CO2  23  21 - 32 mmol/L       Anion gap  7  3.0 - 18 mmol/L       Glucose  94  74 - 99 mg/dL       BUN  9  7.0 - 18 MG/DL       Creatinine  0.86  0.6 - 1.3 MG/DL       BUN/Creatinine ratio  10 (L)  12 - 20         GFR est AA  >60  >60 ml/min/1.24m       GFR est non-AA  >60  >60  ml/min/1.772m      Calcium  9.3  8.5 - 10.1 MG/DL       Bilirubin, total  0.5  0.2 - 1.0 MG/DL       ALT (SGPT)  15  13 - 56 U/L       AST (SGOT)  11  10 - 38 U/L       Alk. phosphatase  69  45 - 117 U/L       Protein, total  8.0  6.4 - 8.2 g/dL       Albumin  3.4  3.4 - 5.0 g/dL       Globulin  4.6 (H)  2.0 - 4.0 g/dL       A-G Ratio  0.7 (L)  0.8 - 1.7         PROTHROMBIN TIME + INR          Collection Time: 07/05/20 12:53 PM         Result  Value  Ref Range            Prothrombin time  20.6 (H)  11.5 - 15.2 sec       INR  1.8 (H)  0.8 - 1.2         URINALYSIS W/ RFLX MICROSCOPIC          Collection Time: 07/05/20  1:40 PM         Result  Value  Ref Range            Color  YELLOW          Appearance  CLEAR          Specific gravity  <1.005 (L)  1.005 - 1.030       pH (UA)  6.5  5.0 - 8.0         Protein  Negative  NEG mg/dL       Glucose  Negative  NEG mg/dL       Ketone  Negative  NEG mg/dL       Bilirubin  Negative  NEG         Blood  Negative  NEG         Urobilinogen  0.2  0.2 - 1.0 EU/dL       Nitrites  Negative  NEG         Leukocyte Esterase  Negative  NEG         COVID-19 RAPID TEST          Collection Time: 07/05/20  3:15 PM         Result  Value  Ref Range            Specimen source  Nasopharyngeal          COVID-19 rapid test  Not detected  NOTD         CULTURE, WOUND Sid Falcon STAIN          Collection Time: 07/05/20  7:08 PM       Specimen: Hand         Result  Value  Ref Range            Special Requests:  RIGHT   HAND   TENDON FLEXOR SHEATH             GRAM STAIN  NO ORGANISMS SEEN          GRAM STAIN  NO WBC'S SEEN          Culture result:  PENDING         CULTURE, BODY FLUID Sid Falcon STAIN          Collection Time: 07/05/20  7:15 PM       Specimen: Drainage; Body Fluid         Result  Value  Ref Range            Special Requests:  RIGHT   DORSAL FINGER             GRAM STAIN  NO ORGANISMS SEEN          GRAM STAIN  NO WBC'S SEEN          Culture result:  PENDING         VANCOMYCIN, RANDOM           Collection Time: 07/06/20  4:00 AM         Result  Value  Ref Range            Vancomycin, random  21.1  5.0 - 40.0 UG/ML       METABOLIC PANEL, BASIC          Collection Time: 07/06/20  4:00 AM         Result  Value  Ref Range            Sodium  136  136 - 145 mmol/L            Potassium  HEMOLYZED,RECOLLECT REQUESTED  3.5 - 5.5 mmol/L            Chloride  111  100 - 111 mmol/L       CO2  20 (L)  21 - 32 mmol/L       Anion gap  5  3.0 - 18 mmol/L       Glucose  106 (H)  74 - 99 mg/dL       BUN  15  7.0 - 18 MG/DL       Creatinine  0.79  0.6 - 1.3 MG/DL       BUN/Creatinine ratio  19  12 - 20         GFR est AA  >60  >60 ml/min/1.36m       GFR est non-AA  >60  >60 ml/min/1.760m  Calcium  8.6  8.5 - 10.1 MG/DL       CBC WITH AUTOMATED DIFF          Collection Time: 07/06/20  4:00 AM         Result  Value  Ref Range            WBC  6.3  4.6 - 13.2 K/uL       RBC  3.61 (L)  4.20 - 5.30 M/uL       HGB  9.8 (L)  12.0 - 16.0 g/dL       HCT  32.2 (L)  35.0 - 45.0 %       MCV  89.2  78.0 - 100.0 FL       MCH  27.1  24.0 - 34.0 PG       MCHC  30.4 (L)  31.0 - 37.0 g/dL       RDW  14.1  11.6 - 14.5 %       PLATELET  311  135 - 420 K/uL       MPV  10.3  9.2 - 11.8 FL       NRBC  0.0  0 PER 100 WBC       ABSOLUTE NRBC  0.00  0.00 - 0.01 K/uL       NEUTROPHILS  80 (H)  40 - 73 %       LYMPHOCYTES  17 (L)  21 - 52 %       MONOCYTES  3  3 - 10 %       EOSINOPHILS  0  0 - 5 %       BASOPHILS  0  0 - 2 %       IMMATURE GRANULOCYTES  1 (H)  0.0 - 0.5 %       ABS. NEUTROPHILS  5.0  1.8 - 8.0 K/UL       ABS. LYMPHOCYTES  1.0  0.9 - 3.6 K/UL       ABS. MONOCYTES  0.2  0.05 - 1.2 K/UL       ABS. EOSINOPHILS  0.0  0.0 - 0.4 K/UL       ABS. BASOPHILS  0.0  0.0 - 0.1 K/UL       ABS. IMM. GRANS.  0.0  0.00 - 0.04 K/UL       DF  AUTOMATED          PROTHROMBIN TIME + INR          Collection Time: 07/06/20  4:00 AM         Result  Value  Ref Range            Prothrombin time  22.2 (H)  11.5 - 15.2 sec       INR  2.0 (H)  0.8 - 1.2          POTASSIUM          Collection Time: 07/06/20  9:23 AM         Result  Value  Ref Range            Potassium  4.4  3.5 - 5.5 mmol/L             Assessment & Plan:        55 y.o. female with PMH PMH: CHF (50%), COPD, home O2 (2L), MV prosthetic, BP/PTSD, neck fusion, fibromyalgia, smoker, pAFIb presenting w/ Finger abscess + cellulitis.   ??  Right middle finger abscess and cellulitis likely secondary to mosquito bite   No systemic signs.   -Continue Rocephin and vancomycin, consider PO abx   -ID consulted, appreciate recs   -Follow-up INR   -N.p.o. for procedure   -Follow-up rapid Covid test   -Hold warfarin, but restart after procedure per pharmacy dosing or use Lovenox   -May need to give vitamin K or FFP if INR elevated   -PT/OT   -Morphine 1 mg every 4 hours as needed   ??   COPD/history of Covid pneumonia on home O2 2 L as needed   Current smoker 10 cigarettes a day   -Continue DuoNebs   -Continue fluticasone and Zyrtec   -Continue oxygen as needed nasal cannula   -Patient does not want nicotine patch   ??   CHF last EF 50%+prosthetic mitral valve   Sees Dr. Charisse Klinefelter   -Continue metoprolol succinate 25 daily   -Patient not on ACE or ARB   -Patient on as needed Lasix for swelling 40 mg   -Hold warfarin for now procedure   ??   GERD   -Continue PPI   ??   PTSD bipolar Fibromyalgia   -Continue lamotrigine 75 mg daily   -Continue Lyrica 246m  times a day   Continue Flexeril as needed at bedtime 5 mg   ??   Hyperlipidemia   Continue pravastatin 40 mg daily      Nicotine Dependence   Patient states she smokes 10 cigarettes/day.   -Nicotine patch ordered   ??   ??      Diet  NPO for procedure     DVT Prophylaxis  SCD     GI Prophylaxis  PPI     Code status  Full     Disposition  TBD     ??      Point of Contact  Jerrica Bulluck   Relationship: god daughter   [phone]             VFara Olden MD , PGY-1     [facility]     July 06, 2020, 10:40 AM

## 2020-07-06 NOTE — Progress Notes (Signed)
 Problem: Mobility Impaired (Adult and Pediatric)  Goal: *Acute Goals and Plan of Care (Insert Text)  Outcome: Resolved/Met   PHYSICAL THERAPY EVALUATION AND DISCHARGE    Patient: Mary [name] (55 y.o. female)  Date: 07/06/2020  Primary Diagnosis: Cellulitis and abscess of hand [L03.119, L02.519]  Abscess of finger [L02.519]  Procedure(s) (LRB):  INCISION AND DRAINAGE RIGHT MIDDLE FINGER (Right) 1 Day Post-Op   Precautions:   (standard )  PLOF: Pt reporting she lives in 1 story house with god daughter and her family with 4 STE. Pt modI at home with cane. Reports she was discharged from [facility] in 12/21 after covid diagnosis, now on 2L O2 at home and does not leave the house.     ASSESSMENT :  Based on the objective data described below, the patient presents with baseline functional mobility. Pt reporting she has been ambulating in room and toileting with modI. Use of IV pole for support,     Patient does not require further skilled intervention at this level of care.        PLAN :  Recommendations and Planned Interventions:   No formal PT needs identified at this time.  Discharge Recommendations: None  Further Equipment Recommendations for Discharge: rolling walker     SUBJECTIVE:   Patient stated "My dog just died and she was the only one who cared about me."    OBJECTIVE DATA SUMMARY:     Past Medical History:   Diagnosis Date    Anxiety     Bipolar 1 disorder (HCC)     CHF (congestive heart failure) (HCC)     COPD (chronic obstructive pulmonary disease) (HCC)     COPD (chronic obstructive pulmonary disease) (HCC)     H/O aortic valve replacement     PTSD (post-traumatic stress disorder)      Past Surgical History:   Procedure Laterality Date    HX HEART CATHETERIZATION      HX HEART VALVE SURGERY      mitral valve replacement     Barriers to Learning/Limitations: None  Compensate with: N/A  Home Situation:   Home Situation  Home Environment: Private residence  # Steps to Enter: 4  Rails to Enter: Yes  Wheelchair  Ramp: No  One/Two Story Residence: One story  Living Alone: No  Support Systems: Other Family Member(s)  Patient Expects to be Discharged to:: Home with family assistance  Current DME Used/Available at Home: Cane, straight  Critical Behavior:  Neurologic State: Alert  Orientation Level: Oriented X4  Cognition: Appropriate decision making  Safety/Judgement: Fall prevention  Psychosocial  Patient Behaviors: Tearful    Strength:    Strength: Within functional limits    Tone & Sensation:   Tone: Normal    Sensation: Intact    Range Of Motion:  AROM: Within functional limits    Posture:  Posture (WDL): Within defined limits      Functional Mobility:  Bed Mobility:     Supine to Sit: Modified independent  Sit to Supine: Modified independent  Scooting: Modified independent  Transfers:  Sit to Stand: Modified independent  Stand to Sit: Modified independent    Balance:   Sitting: Intact  Standing: Intact;With support    Ambulation/Gait Training:  Distance (ft): 15 Feet (ft)  Assistive Device:  (IV pole)  Ambulation - Level of Assistance: Modified independent     Gait Description (WDL): Exceptions to WDL  Gait Abnormalities: Decreased step clearance    Base of Support: Narrowed  Speed/Cadence: Slow  Step Length: Left shortened;Right shortened    Pain:  Pain level pre-treatment: 0/10   Pain level post-treatment: 0/10    Activity Tolerance:   Good tolerance     Please refer to the flowsheet for vital signs taken during this treatment.  After treatment:   []          Patient left in no apparent distress sitting up in chair  [x]          Patient left in no apparent distress in bed  [x]          Call bell left within reach  [x]          Nursing notified  []          Caregiver present  []          Bed alarm activated  []          SCDs applied    COMMUNICATION/EDUCATION:   [x]          Role of Physical Therapy in the acute care setting.  [x]          Fall prevention education was provided and the patient/caregiver indicated  understanding.  []          Patient/family have participated as able in goal setting and plan of care.  []          Patient/family agree to work toward stated goals and plan of care.  []          Patient understands intent and goals of therapy, but is neutral about his/her participation.  []          Patient is unable to participate in goal setting/plan of care: ongoing with therapy staff.  []          Other:    Thank you for this referral.  Mary [name], PT   Time Calculation: 38 mins      Eval Complexity: History: MEDIUM  Complexity : 1-2 comorbidities / personal factors will impact the outcome/ POC Exam:LOW Complexity : 1-2 Standardized tests and measures addressing body structure, function, activity limitation and / or participation in recreation  Presentation: LOW Complexity : Stable, uncomplicated  Clinical Decision Making:Low Complexity low  Overall Complexity:LOW

## 2020-07-06 NOTE — Progress Notes (Signed)
Reason for Admission:  Cellulitis and abscess of hand [L03.119, L02.519]  Abscess of finger [L02.519]                 RUR Score:  19%           Plan for utilizing home health:  Unable to determine on assessment. Waiting PT/OT evaluation                    Likelihood of Readmission:   Moderate                         Do you (patient/family) have any concerns for transition/discharge?  no    Transition of Care Plan:       Initial assessment completed with patient.     Cognitive status of patient: oriented to time, place, person and situation.     Face sheet information confirmed:  yes.      The patient designates Lattie Haw to participate in her discharge plan and to receive any needed information.     This patient rents a room in a single family home.    Patient is able to navigate steps as needed.     Prior to hospitalization, patient was considered to be independent with ADLs/IADLS : yes .   Patient has a current ACP document on file: no      Healthcare Decision Maker:     The patient will need Medicaid cab will be available to transport patient home upon discharge.       The patient already has Cane, and oxygen,  medical equipment available in the home.     Patient is not currently active with home health.    Patient has not stayed in a skilled nursing facility or rehab.  Was  stay within last 60 days : no.      This patient is on dialysis :no    Currently, the discharge plan is home with family assistance vs home health    The patient states that she can obtain her medications from the pharmacy, and take her medications as directed.    Patient's current insurance is [employer] Medicare Part A [place]B and [employer] Medicaid      Care Management Interventions  PCP Verified by CM: Yes  Mode of Transport at Discharge: Other (see comment) (family or Medicaid transport)  Transition of Care Consult (CM Consult): Discharge Planning  Physical Therapy Consult: Yes  Occupational Therapy Consult: Yes  Support Systems: Other Family  Member(s),Child(ren)  Confirm Follow Up Transport: Cab (Medicaid)  Discharge Location  Patient Expects to be Discharged to:: Home with family assistance      Case Manager will continue to monitor and assist with transition of care needs.    Osker Mason, BSN, RN  Care Management  Pager # [phone]

## 2020-07-06 NOTE — Progress Notes (Signed)
Progress Note    Patient: Mary [name] MRN: [id number]  SSN: [id number]    Date of Birth: [birth year]  Age: 55 y.o.  Sex: female      Admit Date: 07/05/2020    1 Day Post-Op    Procedure:  Procedure(s):  INCISION AND DRAINAGE RIGHT MIDDLE FINGER    Subjective:     Patient has no new complaints.  Patient denies chest pain, shortness of breath, fevers or chills.  She reports improvement in middle finger pain.  She does report some pain in the index finger secondary to a blister as well.    Objective:     Visit Vitals  BP (!) 99/59   Pulse 77   Temp 98.5 ??F (36.9 ??C)   Resp 18   Ht 5\' 3"  (1.6 m)   Wt 205 lb 12.8 oz (93.4 kg)   SpO2 98%   BMI 36.46 kg/m??       Temp (24hrs), Avg:98.3 ??F (36.8 ??C), Min:97.6 ??F (36.4 ??C), Max:99.1 ??F (37.3 ??C)      Physical Exam:    GENERAL: alert, cooperative, no distress, appears stated age,     EXTREMITIES: Right hand: Dressing clean dry intact no drainage or breakdown.  There is an area of blister along the ulnar aspect of the distal index finger.  This is tender.  There is no fluctuance or induration or any signs of infection beneath the blister.    Data Review: images and reports reviewed    Lab Review:   All lab results for the last 24 hours reviewed.    Assessment:     Hospital Problems  Date Reviewed: 06-30-20          Codes Class Noted POA    Cellulitis and abscess of hand ICD-10-CM: L03.119, L02.519  ICD-9-CM: 682.4  07/05/2020 Unknown        Abscess of finger ICD-10-CM: L02.519  ICD-9-CM: 681.00  07/05/2020 Unknown        Suppurative tenosynovitis of flexor tendon of right hand ICD-10-CM: M65.141  ICD-9-CM: 727.05  07/05/2020 Unknown        Abrasion of right middle finger with infection ICD-10-CM: 07/07/2020, L08.9  ICD-9-CM: 915.1  07/05/2020 Unknown              Plan/Recommendations/Medical Decision Making:     Continue present treatment     Ice and elevation.    Observation for right index finger with possible bedside decompression tomorrow if needed.    IV antibiotics followed by  p.o. antibiotics per infectious disease.    Occupational therapy for early range of motion.

## 2020-07-07 ENCOUNTER — Encounter

## 2020-07-07 LAB — METABOLIC PANEL, BASIC
Anion gap: 4 mmol/L (ref 3.0–18)
BUN/Creatinine ratio: 15 (ref 12–20)
BUN: 11 MG/DL (ref 7.0–18)
CO2: 24 mmol/L (ref 21–32)
Calcium: 8 MG/DL — ABNORMAL LOW (ref 8.5–10.1)
Chloride: 113 mmol/L — ABNORMAL HIGH (ref 100–111)
Creatinine: 0.72 MG/DL (ref 0.6–1.3)
GFR est AA: >60 mL/min/{1.73_m2} (ref >60)
GFR est non-AA: >60 mL/min/{1.73_m2} (ref >60)
Glucose: 98 mg/dL (ref 74–99)
Potassium: 3.7 mmol/L (ref 3.5–5.5)
Sodium: 141 mmol/L (ref 136–145)

## 2020-07-07 LAB — PROTHROMBIN TIME + INR
INR: 2.8 — ABNORMAL HIGH (ref 0.8–1.2)
Prothrombin time: 29.2 s — ABNORMAL HIGH (ref 11.5–15.2)

## 2020-07-07 LAB — CBC WITH AUTOMATED DIFF
ABS. BASOPHILS: 0.1 10*3/uL (ref 0.0–0.1)
ABS. EOSINOPHILS: 0.2 10*3/uL (ref 0.0–0.4)
ABS. IMM. GRANS.: 0 10*3/uL (ref 0.00–0.04)
ABS. LYMPHOCYTES: 2.3 10*3/uL (ref 0.9–3.6)
ABS. MONOCYTES: 0.5 10*3/uL (ref 0.05–1.2)
ABS. NEUTROPHILS: 3 10*3/uL (ref 1.8–8.0)
ABSOLUTE NRBC: 0 10*3/uL (ref 0.00–0.01)
BASOPHILS: 1 % (ref 0–2)
EOSINOPHILS: 3 % (ref 0–5)
HCT: 30.5 % — ABNORMAL LOW (ref 35.0–45.0)
HGB: 9.1 g/dL — ABNORMAL LOW (ref 12.0–16.0)
IMMATURE GRANULOCYTES: 0 % (ref 0.0–0.5)
LYMPHOCYTES: 38 % (ref 21–52)
MCH: 26.3 PG (ref 24.0–34.0)
MCHC: 29.8 g/dL — ABNORMAL LOW (ref 31.0–37.0)
MCV: 88.2 FL (ref 78.0–100.0)
MONOCYTES: 8 % (ref 3–10)
MPV: 9.6 FL (ref 9.2–11.8)
NEUTROPHILS: 50 % (ref 40–73)
NRBC: 0 PER 100 WBC
PLATELET: 273 10*3/uL (ref 135–420)
RBC: 3.46 M/uL — ABNORMAL LOW (ref 4.20–5.30)
RDW: 14.3 % (ref 11.6–14.5)
WBC: 6 10*3/uL (ref 4.6–13.2)

## 2020-07-07 LAB — CBC WITH AUTO DIFFERENTIAL
Basophils %: 1 % (ref 0–2)
Basophils Absolute: 0.1 10*3/uL (ref 0.0–0.1)
Eosinophils %: 3 % (ref 0–5)
Eosinophils Absolute: 0.2 10*3/uL (ref 0.0–0.4)
Granulocyte Absolute Count: 0 10*3/uL (ref 0.00–0.04)
Hematocrit: 30.5 % — ABNORMAL LOW (ref 35.0–45.0)
Hemoglobin: 9.1 g/dL — ABNORMAL LOW (ref 12.0–16.0)
Immature Granulocytes: 0 % (ref 0.0–0.5)
Lymphocytes %: 38 % (ref 21–52)
Lymphocytes Absolute: 2.3 10*3/uL (ref 0.9–3.6)
MCH: 26.3 PG (ref 24.0–34.0)
MCHC: 29.8 g/dL — ABNORMAL LOW (ref 31.0–37.0)
MCV: 88.2 FL (ref 78.0–100.0)
MPV: 9.6 FL (ref 9.2–11.8)
Monocytes %: 8 % (ref 3–10)
Monocytes Absolute: 0.5 10*3/uL (ref 0.05–1.2)
NRBC Absolute: 0 10*3/uL (ref 0.00–0.01)
Neutrophils %: 50 % (ref 40–73)
Neutrophils Absolute: 3 10*3/uL (ref 1.8–8.0)
Nucleated RBCs: 0 PER 100 WBC
Platelets: 273 10*3/uL (ref 135–420)
RBC: 3.46 M/uL — ABNORMAL LOW (ref 4.20–5.30)
RDW: 14.3 % (ref 11.6–14.5)
WBC: 6 10*3/uL (ref 4.6–13.2)

## 2020-07-07 LAB — BASIC METABOLIC PANEL
Anion Gap: 4 mmol/L (ref 3.0–18)
BUN: 11 MG/DL (ref 7.0–18)
Bun/Cre Ratio: 15 (ref 12–20)
CO2: 24 mmol/L (ref 21–32)
Calcium: 8 MG/DL — ABNORMAL LOW (ref 8.5–10.1)
Chloride: 113 mmol/L — ABNORMAL HIGH (ref 100–111)
Creatinine: 0.72 MG/DL (ref 0.6–1.3)
EGFR IF NonAfrican American: >60 mL/min/{1.73_m2} (ref >60)
GFR African American: >60 mL/min/{1.73_m2} (ref >60)
Glucose: 98 mg/dL (ref 74–99)
Potassium: 3.7 mmol/L (ref 3.5–5.5)
Sodium: 141 mmol/L (ref 136–145)

## 2020-07-07 LAB — PROTIME-INR
INR: 2.8 — ABNORMAL HIGH (ref 0.8–1.2)
Protime: 29.2 s — ABNORMAL HIGH (ref 11.5–15.2)

## 2020-07-07 MED ORDER — LAMOTRIGINE 25 MG TAB
25 mg | Freq: Every day | ORAL | Status: DC
Start: 2020-07-07 — End: 2020-07-07
  Administered 2020-07-07: 20:00:00 via ORAL

## 2020-07-07 MED ORDER — OXYCODONE-ACETAMINOPHEN 5 MG-325 MG TAB
5-325 mg | ORAL_TABLET | ORAL | 0 refills | Status: AC | PRN
Start: 2020-07-07 — End: 2020-07-10

## 2020-07-07 MED ORDER — FAMOTIDINE 20 MG TAB
20 mg | Freq: Two times a day (BID) | ORAL | Status: DC | PRN
Start: 2020-07-07 — End: 2020-07-07

## 2020-07-07 MED ORDER — LAMOTRIGINE 25 MG TAB
25 mg | Freq: Every day | ORAL | Status: DC
Start: 2020-07-07 — End: 2020-07-07

## 2020-07-07 MED ORDER — AMOXICILLIN CLAVULANATE 875 MG-125 MG TAB
875-125 mg | ORAL_TABLET | Freq: Two times a day (BID) | ORAL | 0 refills | Status: AC
Start: 2020-07-07 — End: 2020-07-18

## 2020-07-07 MED ORDER — ACETAMINOPHEN 500 MG TAB
500 mg | Freq: Three times a day (TID) | ORAL | Status: DC
Start: 2020-07-07 — End: 2020-07-07
  Administered 2020-07-07: 20:00:00 via ORAL

## 2020-07-07 MED FILL — VANCOMYCIN 1,000 MG IV SOLR: 1000 mg | INTRAVENOUS | Qty: 1000

## 2020-07-07 MED FILL — CEFTRIAXONE 1 GRAM SOLUTION FOR INJECTION: 1 gram | INTRAMUSCULAR | Qty: 1

## 2020-07-07 MED FILL — PANTOPRAZOLE 40 MG TAB, DELAYED RELEASE: 40 mg | ORAL | Qty: 1

## 2020-07-07 MED FILL — PREGABALIN 50 MG CAP: 50 mg | ORAL | Qty: 1

## 2020-07-07 MED FILL — NICOTINE 14 MG/24 HR DAILY PATCH: 14 mg/24 hr | TRANSDERMAL | Qty: 1

## 2020-07-07 MED FILL — OXYCODONE-ACETAMINOPHEN 5 MG-325 MG TAB: 5-325 mg | ORAL | Qty: 1

## 2020-07-07 MED FILL — ACETAMINOPHEN 500 MG TAB: 500 mg | ORAL | Qty: 2

## 2020-07-07 MED FILL — LAMOTRIGINE 25 MG TAB: 25 mg | ORAL | Qty: 3

## 2020-07-07 MED FILL — CETIRIZINE 10 MG TAB: 10 mg | ORAL | Qty: 1

## 2020-07-07 MED FILL — PRAVASTATIN 20 MG TAB: 20 mg | ORAL | Qty: 2

## 2020-07-07 NOTE — Progress Notes (Signed)
Pharmacist Consult: Warfarin Management  Assessment/Plan  INR is 2.8 currently therapeutic  Will hold warfarin today due to significant increase in INR.   Recheck INR daily and adjust dose accordingly. Will continue to follow INR trend and patient's clinical progress daily.     Subjective/Objective  Patient on warfarin therapy for Atrial Fibrillation  Target goal INR of 2-3  Patient has been on warfarin PTA (5 mg Mon/Wed/Fri, 3.75 mg Tue,Thurs/Sat/Sun)    Date 2/21 2/20 2/21 2/22        INR 2 1.8 2.0 2.8        Warfarin Dose (mg) 5 mg 5 mg 5 mg  hold            Bleeding/Sensitivity Risk Factors:  Potentially interacting medications include:   Drugs that may increase INR: none  Drugs that may decrease INR: none  Other anticoagulants include: NA      Labs:  COAGS:    Lab Results   Component Value Date/Time    PTP 29.2 (H) 07/07/2020 04:48 AM    INR 2.8 (H) 07/07/2020 04:48 AM     CBC:    Lab Results   Component Value Date/Time    WBC 6.0 07/07/2020 04:48 AM    HGB 9.1 (L) 07/07/2020 04:48 AM    HCT 30.5 (L) 07/07/2020 04:48 AM    PLT 273 07/07/2020 04:48 AM       Estimated Creatinine Clearance: 98.1 mL/min (based on SCr of 0.72 mg/dL).    No results found for: OB1, OB2, OB3, OCBL, POCOBL, SOB, OCCBLDEXT, OCCBLDEXT    Labs reviewed. Assessment performed for missed warfarin doses, new drug interactions, dietary intake or supplement changes, documentation of bleeding and other changes that may affect the INR.    Plan:    Warfarin HELD    Pharmacy to follow daily and will provide subsequent warfarin dosing based on clinical status.    Thank you for the consult,  Lanier Ensign  07/07/2020

## 2020-07-07 NOTE — Progress Notes (Signed)
Progress Note    Patient: Mary [name] MRN: [id number]  SSN: [id number]    Date of Birth: [birth year]  Age: 55 y.o.  Sex: female      Admit Date: 07/05/2020    2 Days Post-Op    Procedure:  Procedure(s):  INCISION AND DRAINAGE RIGHT MIDDLE FINGER    Subjective:     Patient has no new complaints.  Patient denies chest pain, shortness of breath, fevers or chills.  She reports improvement in middle finger and index finger pain.    Objective:     Visit Vitals  BP 106/69   Pulse 74   Temp 98.6 ??F (37 ??C)   Resp 19   Ht 5\' 3"  (1.6 m)   Wt 205 lb 12.8 oz (93.4 kg)   SpO2 98%   BMI 36.46 kg/m??       Temp (24hrs), Avg:98.4 ??F (36.9 ??C), Min:98.1 ??F (36.7 ??C), Max:98.6 ??F (37 ??C)      Physical Exam:    GENERAL: alert, cooperative, no distress, appears stated age,     EXTREMITIES: Right hand: Dressing clean dry intact no drainage or breakdown.  There is an area of blister along the ulnar aspect of the distal index finger that has improved.  There is no fluctuance or induration or any signs of infection beneath the blister.    Data Review: images and reports reviewed    Lab Review:   All lab results for the last 24 hours reviewed.    Assessment:     Hospital Problems  Date Reviewed: 06-22-20          Codes Class Noted POA    Cellulitis and abscess of hand ICD-10-CM: L03.119, L02.519  ICD-9-CM: 682.4  07/05/2020 Unknown        Abscess of finger ICD-10-CM: L02.519  ICD-9-CM: 681.00  07/05/2020 Unknown        Suppurative tenosynovitis of flexor tendon of right hand ICD-10-CM: M65.141  ICD-9-CM: 727.05  07/05/2020 Unknown        Abrasion of right middle finger with infection ICD-10-CM: 07/07/2020, L08.9  ICD-9-CM: 915.1  07/05/2020 Unknown              Plan/Recommendations/Medical Decision Making:     Continue present treatment     Incisions left open to air.  Local wound care for dorsal middle finger with band aid only to encourage ROM.    F/U in chart, ok for DC    Outpatient OT ordered

## 2020-07-07 NOTE — Progress Notes (Signed)
Reviewed DC instructions with pt. All questions answered. Reviewed pts follow up appointments with her. Verbalized understanding. PIV removed. Pt left with all belongings

## 2020-07-07 NOTE — Progress Notes (Signed)
Problem: Pain  Goal: *Control of Pain  07/07/2020 0016 by Clyde Lundborg, RN  Outcome: Progressing Towards Goal  07/07/2020 0015 by Ndipisiri, Maxine Glenn, RN  Outcome: Progressing Towards Goal     Problem: Patient Education: Go to Patient Education Activity  Goal: Patient/Family Education  07/07/2020 0016 by Clyde Lundborg, RN  Outcome: Progressing Towards Goal  07/07/2020 0015 by NdipisiriMaxine Glenn, RN  Outcome: Progressing Towards Goal     Problem: Falls - Risk of  Goal: *Absence of Falls  Description: Document Bridgette Habermann Fall Risk and appropriate interventions in the flowsheet.  07/07/2020 0016 by Clyde Lundborg, RN  Outcome: Progressing Towards Goal  Note: Fall Risk Interventions:            Medication Interventions: Evaluate medications/consider consulting pharmacy,Patient to call before getting OOB                07/07/2020 0015 by Ndipisiri, Maxine Glenn, RN  Outcome: Progressing Towards Goal  Note: Fall Risk Interventions:            Medication Interventions: Evaluate medications/consider consulting pharmacy,Patient to call before getting OOB                   Problem: Patient Education: Go to Patient Education Activity  Goal: Patient/Family Education  07/07/2020 0016 by Clyde Lundborg, RN  Outcome: Progressing Towards Goal  07/07/2020 0015 by NdipisiriMaxine Glenn, RN  Outcome: Progressing Towards Goal     Problem: Surgical Wound Care  Goal: *Non-infected Wound: Absence of infection signs and symptoms  Description: Infection control procedures (eg: clean dressings, clean gloves, hand washing, precautions to isolate wound from contamination, sterile instruments used for wound debridement) should be implemented.  07/07/2020 0016 by Clyde Lundborg, RN  Outcome: Progressing Towards Goal  07/07/2020 0015 by Clyde Lundborg, RN  Outcome: Progressing Towards Goal  Goal: *Infected Wound: Prevention of further infection and promotion of healing  Description: Consider the use of systemic  antibiotics in patients with cellulitis, osteomyelitis, bacteremia, or sepsis if there are no contraindications.  07/07/2020 0016 by Clyde Lundborg, RN  Outcome: Progressing Towards Goal  07/07/2020 0015 by Clyde Lundborg, RN  Outcome: Progressing Towards Goal  Goal: *Improvement of existing wound and maintenance of skin integrity  07/07/2020 0016 by Ndipisiri, Maxine Glenn, RN  Outcome: Progressing Towards Goal  07/07/2020 0015 by Ndipisiri, Maxine Glenn, RN  Outcome: Progressing Towards Goal     Problem: Patient Education: Go to Patient Education Activity  Goal: Patient/Family Education  07/07/2020 0016 by Clyde Lundborg, RN  Outcome: Progressing Towards Goal  07/07/2020 0015 by Clyde Lundborg, RN  Outcome: Progressing Towards Goal     Problem: Patient Education: Go to Patient Education Activity  Goal: Patient/Family Education  07/07/2020 0016 by Clyde Lundborg, RN  Outcome: Progressing Towards Goal  07/07/2020 0015 by Clyde Lundborg, RN  Outcome: Progressing Towards Goal

## 2020-07-07 NOTE — Progress Notes (Signed)
Order placed for COVID-19 test, per Verbal Order from Dr. Abelina Bachelor on 07/07/2020.      Last office visit: 05/15/2020 (inpatient)  Follow up Visit: 07/15/2020 (hospital f/u)    Provider is aware of last office visit and follow up.  No further action requested from provider.

## 2020-07-07 NOTE — Discharge Summary (Signed)
Discharge Summary by Fara Olden, MD at 07/07/20 1623                Author: Fara Olden, MD  Service: FAMILY MEDICINE  Author Type: Resident       Filed: 07/07/20 1838  Date of Service: 07/07/20 1623  Status: Attested Addendum          Editor: Solly Derasmo, Mahalia Longest, MD (Resident)       Related Notes: Original Note by Fara Olden, MD (Resident) filed at 07/07/20 1832          Cosigner: Enid Cutter, MD at 07/08/20 1952          Attestation signed by Enid Cutter, MD at 07/08/20 Ruth Attending Note       I have personally seen, evaluated and examined the patient. See resident's discharge note for details. Agree with documentation of admission and discharge plan. Discharge preparation  required > than 30 minutes.       Enid Cutter, MD   07/08/2020, 7:52 PM                                            Discharge Summary   [facility]             Patient: Colena Ketterman  MRN: [id number]   CSN: [id number]          Date of Birth: 18-Mar-1966   Age: 55 y.o.   Sex: female            Admission Date: 07/05/2020  Discharge Date: 07/07/2020        Attending: Enid Cutter, MD  PCP: Izora Ribas, MD        ===================================================================      Reason for Admission:    Cellulitis and abscess of hand [L03.119, L02.519]   Abscess of finger [L02.519]      Discharge Diagnoses:    Cellulitis and Abscess of Right Middle Finger   H/o COPD, COVID PNA (home O2 of 2L)   CHF, prosthetic mitral valve   GERD   PTSD, Bipolar Disorder   HLD   Nicotine Dependence      Important notes to PCP/ follow-up studies and evaluations    -Follow up on completion of abx - Augmentin   -Follow up appointment with Orthopedic Surgery (Dr. Redmond Pulling) on 3/2 for wound check         Pending labs and studies:   None      Operative Procedures:    INCISION AND DRAINAGE RIGHT MIDDLE FINGER      Discharge Medications:        Current Discharge  Medication List              START taking these medications          Details        amoxicillin-clavulanate (Augmentin) 875-125 mg per tablet  Take 1 Tablet by mouth every twelve (12) hours for 11 days.   Qty: 22 Tablet, Refills:  0   Start date: 07/07/2020, End date:  07/18/2020               oxyCODONE-acetaminophen (PERCOCET) 5-325 mg per tablet  Take 1 Tablet by mouth every four (4) hours as needed for Pain for up to  3 days. Max Daily Amount: 5 Tablets.   Qty: 5 Tablet, Refills:  0   Start date: 07/07/2020, End date:  07/10/2020          Associated Diagnoses: Abscess of finger of right hand                     CONTINUE these medications which have NOT CHANGED          Details        aspirin 81 mg chewable tablet  Take 81 mg by mouth daily.               furosemide (Lasix) 40 mg tablet  Take 40 mg by mouth daily as needed (swelling).               pregabalin (LYRICA) 200 mg capsule  Take 1 Capsule by mouth three (3) times daily.   Qty: 90 Capsule, Refills:  2          Associated Diagnoses: Chronic pain syndrome               ipratropium-albuteroL (Combivent Respimat) 20-100 mcg/actuation inhaler  Take 1 Puff by inhalation every six (6) hours.   Qty: 4 g, Refills:  3               fluticasone propionate (FLONASE) 50 mcg/actuation nasal spray  1 spray by nasal route daily   Qty: 1 Each, Refills:  2               hydrOXYzine HCL (ATARAX) 25 mg tablet  Take 25 mg by mouth two (2) times daily as needed for Anxiety.               acetaminophen (Tylenol Extra Strength) 500 mg tablet  Take 1,000 mg by mouth every eight (8) hours as needed for Pain.               albuterol (PROVENTIL HFA, VENTOLIN HFA, PROAIR HFA) 90 mcg/actuation inhaler  Take 2 Puffs by inhalation every six (6) hours as needed.               cetirizine (ZYRTEC) 10 mg tablet  Take 10 mg by mouth daily.               lamoTRIgine (LaMICtal) 25 mg tablet  75 mg nightly.               omeprazole (PRILOSEC) 40 mg capsule  Take 40 mg by mouth daily.                warfarin (COUMADIN) 2.5 mg tablet  2.5 mg. Takes 28m MWF and takes 3.75 mg Tue, Thrs, Sat, Sunday               cyclobenzaprine (FLEXERIL) 5 mg tablet  Take 1 Tablet by mouth nightly as needed for Muscle Spasm(s).   Qty: 30 Tablet, Refills:  1          Associated Diagnoses: Chronic pain syndrome               metoprolol succinate (TOPROL-XL) 25 mg XL tablet  Take 25 mg by mouth daily.               pravastatin (PRAVACHOL) 40 mg tablet  Take 40 mg by mouth nightly.                       STOP taking these medications  cephALEXin (Keflex) 500 mg capsule  Comments:    Reason for Stopping:                      trimethoprim-sulfamethoxazole (Bactrim DS) 160-800 mg per tablet  Comments:    Reason for Stopping:                             Disposition: Home      Consultants:     Orthopedic Surgery   Infectious Disease      Brief Hospital Course (including pertinent history and physical findings)      Priscilla Kirstein is a 55 y.o. female with PMH of CHF (LVEF 50%), COPD, home O2 (2L), MV prosthetic, Bipolar/PTSD, neck fusion, fibromyalgia, nicotine dependence, and pAFIb who presented with R Finger abscess and cellulitis x 2 days. Patient explained  that she had gotten a bug bite two days before presentation; also mentioned she had been digging a grave in her backyard for a pet that had just passed. She came to the ED for right middle digit pain, swelling, and redness. Redness and swelling and pain  was extending from her hands to her forearms. At that time she was taking Bactrim and Keflex. In the ED, pt was started on IVF NS and IV Rocephin and Vanc. Ortho was consulted and the pt was taken to the OR from the ED for I&D of the R middle digit.      I&D was performed without complication and wound and fluid cx were collected.  On exam, pt had no concerning symptoms and the motor function and sensation of her R hand remained intact. Pain was managed with Tylenol prn and Percocet 64m q6hrs prn, which  was weaned to  582mq4hrs. ID was consulted and recommendations were appreciated; IV Rocephin and Vanc were continued. Fluid cx was positive for Group A Streptococci. As patient was stable and without systemic signs of infection, she was discharge with  PO Augmentin and five tablets of Percocet 7.26m90mo take q4hrs prn for pain. Patient was advised to keep follow-up appointment with Ortho on 3/2 and appointment with PFM on 2/25.      Management of patient's other health issues during this hospitalization are described in more detail below:      COPD/history of Covid pneumonia on home O2 2 L as needed   Current smoker 10 cigarettes a day   -Continued DuoNebs   -Continued fluticasone and Zyrtec   -Continued oxygen as needed nasal cannula   ??   CHF last EF 50%+prosthetic mitral valve   Sees Dr. VenCharisse Klinefeltertpatient.   -Continued metoprolol succinate 25 daily   -Patient was not on ACE or ARB   -Patient was on as needed Lasix for swelling 40 mg   -Held warfarin for now procedure   ??   GERD   -Continued PPI   ??   PTSD bipolar Fibromyalgia   -Continued lamotrigine 75 mg daily   -Continued Lyrica 200m426m?times a day   -Continued Flexeril as needed at bedtime 5 mg   ??   Hyperlipidemia   -Continued pravastatin 40 mg daily   ??   Nicotine Dependence   Patient states she smokes 10 cigarettes/day.   -Started nicotine patch            CURRENT ADMISSION IMAGING RESULTS       XR CHEST PORT      Result Date: 07/05/2020  No definite radiographic finding for an acute cardiopulmonary process. Prominent reticular lung markings suggestive of underlying chronic lung disease.            Cardiology Procedures/Testing:      MODALITY  RESULTS        EKG  Results for orders placed or performed during the hospital encounter of 07/05/20      EKG, 12 LEAD, INITIAL      Result  Value  Ref Range        Ventricular Rate  82  BPM        Atrial Rate  82  BPM        P-R Interval  126  ms        QRS Duration  102  ms        Q-T Interval  390  ms        QTC Calculation  (Bezet)  455  ms        Calculated P Axis  87  degrees        Calculated R Axis  29  degrees        Calculated T Axis  59  degrees        Diagnosis              Normal sinus rhythm   Minimal voltage criteria for LVH, may be normal variant ( Sokolow-Lyon )   Nonspecific ST and T wave abnormality   Abnormal ECG   When compared with ECG of 30-May-2020 12:08,   No significant change was found   Confirmed by Berna Spare MD, --- [phone]) on 07/06/2020 8:11:25 AM                     ECHO  05/03/20      ECHO ADULT FOLLOW-UP OR LIMITED 05/04/2020 05/04/2020      Interpretation Summary   ?  COVID +   ?  Contrast used: Definity.   ?  Technical qualifiers: Echo study was technically difficult with poor endocardial visualization and limited due to patient's condition.   ?  Left??Ventricle: Left ventricle size is normal. Mildly increased wall thickness. See diagram for wall motion findings. EF by visual approximation is 50%.   ?  Right??Ventricle: Not well visualized.   ?  Aortic??Valve: Mild transvalvular regurgitation.   ?  Mitral??Valve: Bioprosthetic valve. Elevated prosthetic gradient. MV mean gradient is 9 mmHg.   ?  Pulmonary??Arteries: Pulmonary hypertension not present. The estimated pulmonary artery systolic pressure is 27 mmHg.      Signed by: Leland Her, MD on 05/04/2020  9:23 PM           IR  No results found for this or any previous visit.           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   Elevated LVEDP.   Recommend intense risk factor modification.      Signed by: Fredirick Maudlin, MD on 04/27/2020 10:52 AM           Special Testing/Procedures:      MODALITY  RESULTS        MICRO  All Micro Results         Procedure  Component  Value  Units  Date/Time        SARS-COV-2 BY NAA [[id number]]  Collected: 07/07/20 1320  Order Status: Completed  Updated: 07/07/20 1517        CULTURE, BODY FLUID Sid Falcon  STAIN [[id number]]  (Abnormal)  Collected: 07/05/20 1915        Order Status: Completed  Specimen: Body Fluid from Drainage  Updated: 07/07/20 0951          Special Requests:  --               RIGHT   DORSAL FINGER             GRAM STAIN  NO ORGANISMS SEEN                 NO WBC'S SEEN               Culture result:              LIGHT STREPTOCOCCI, BETA HEMOLYTIC GROUP A Penicillin and ampicillin are drugs of choice for treatment of beta-hemolytic streptococcal  infections. Susceptibility testing of penicillins and beta-lactams approved by the FDA for treatment of beta-hemolytic streptococcal infections need not be performed routinely, because nonsusceptible isolates are extremely rare. CLSI 2012                                     Culture performed on Fluid swab specimen                       (NOTE) CALLED TO [facility] RN AT 3818 ON 2.22.22 RB        CULTURE, ANAEROBIC [[id number]]  Collected: 07/05/20 1908        Order Status: Completed  Specimen: Hand  Updated: 07/07/20 0938          Special Requests:  --               RIGHT   HAND   TENDON FLEXOR SHEATH             Culture result:  NO GROWTH THUS FAR             CULTURE, Gar Gibbon STAIN [[id number]]  Collected: 07/05/20 1908        Order Status: Completed  Specimen: Hand  Updated: 07/07/20 0937          Special Requests:  --               RIGHT   HAND   TENDON FLEXOR SHEATH             GRAM STAIN  NO ORGANISMS SEEN                 NO WBC'S SEEN               Culture result:  NO GROWTH THUS FAR             CULTURE, BLOOD [[id number]]  Collected: 07/05/20 1250        Order Status: Completed  Specimen: Blood  Updated: 07/07/20 0656          Special Requests:  NO SPECIAL REQUESTS               Culture result:  NO GROWTH 2 DAYS             CULTURE, BLOOD [[id number]]  Collected: 07/05/20 1205        Order Status: Completed  Specimen: Blood  Updated: 07/07/20 1443  Special Requests:  NO SPECIAL REQUESTS               Culture result:  NO GROWTH 2 DAYS              CULTURE, ANAEROBIC [[id number]]  Collected: 07/05/20 1915        Order Status: Completed  Specimen: Surgical Specimen  Updated: 07/06/20 1005        COVID-19 RAPID TEST [[id number]]  Collected: 07/05/20 1515        Order Status: Completed  Specimen: Nasopharyngeal  Updated: 07/05/20 1543          Specimen source  Nasopharyngeal               COVID-19 rapid test  Not detected               Comment:  Rapid Abbott ID Now         Rapid NAAT:  The specimen is NEGATIVE for SARS-CoV-2, the novel coronavirus associated with COVID-19.         Negative results should be treated as presumptive and, if inconsistent with clinical signs and symptoms or necessary for patient management, should be tested with an alternative molecular assay.   Negative results do not preclude SARS-CoV-2 infection and should not be used as the sole basis for patient management decisions.         This test has been authorized by the FDA under an Emergency Use Authorization (EUA) for use by authorized laboratories.    Fact sheet for Healthcare Providers: LittleDVDs.dk   Fact sheet for Patients: SatelliteRebate.it         Methodology: Isothermal Nucleic Acid Amplification                                   ABG  Lab Results      Component  Value  Date/Time        pH (POC)  7.48 (H)  05/08/2020 04:35 AM        pCO2 (POC)  26.9 (L)  05/08/2020 04:35 AM        pO2 (POC)  64 (L)  05/08/2020 04:35 AM        HCO3 (POC)  19.9 (L)  05/08/2020 04:35 AM        FIO2 (POC)  32  05/03/2020 09:26 PM                 UA  No results found for this or any previous visit.        Laboratory Results:      LABORATORY  RESULTS        HEMATOLOGY  Lab Results      Component  Value  Date/Time        WBC  6.0  07/07/2020 04:48 AM        HGB  9.1 (L)  07/07/2020 04:48 AM        HCT  30.5 (L)  07/07/2020 04:48 AM        PLATELET  273  07/07/2020 04:48 AM        MCV  88.2  07/07/2020 04:48 AM                  CHEMISTRIES  Lab Results       Component  Value  Date/Time        Sodium  141  07/07/2020 04:48 AM  Potassium  3.7  07/07/2020 04:48 AM        Chloride  113 (H)  07/07/2020 04:48 AM        CO2  24  07/07/2020 04:48 AM        Anion gap  4  07/07/2020 04:48 AM        Glucose  98  07/07/2020 04:48 AM        BUN  11  07/07/2020 04:48 AM        Creatinine  0.72  07/07/2020 04:48 AM        BUN/Creatinine ratio  15  07/07/2020 04:48 AM        GFR est AA  >60  07/07/2020 04:48 AM        GFR est non-AA  >60  07/07/2020 04:48 AM        Calcium  8.0 (L)  07/07/2020 04:48 AM                 HEPATIC FUNCTION  Lab Results      Component  Value  Date/Time        Albumin  3.4  07/05/2020 12:53 PM        Bilirubin, total  0.5  07/05/2020 12:53 PM        Protein, total  8.0  07/05/2020 12:53 PM        Globulin  4.6 (H)  07/05/2020 12:53 PM        A-G Ratio  0.7 (L)  07/05/2020 12:53 PM        ALT (SGPT)  15  07/05/2020 12:53 PM        Alk. phosphatase  69  07/05/2020 12:53 PM               LACTIC ACID  Lab Results      Component  Value  Date/Time        Lactic acid  1.1  05/07/2020 03:30 PM              CARDIAC PANEL  Lab Results      Component  Value  Date/Time        CK  211 (H)  04/05/2020 09:22 AM        CK - MB  1.9  04/05/2020 09:22 AM        CK-MB Index  0.9  04/05/2020 09:22 AM        Troponin-I, QT  <0.02  04/05/2020 12:38 PM                 NT-proBNP  Lab Results      Component  Value  Date/Time        NT pro-BNP  292  05/30/2020 12:10 PM        NT pro-BNP  1,579 (H)  05/03/2020 09:40 AM        NT pro-BNP  506  04/05/2020 09:22 AM        NT pro-BNP  441  12/26/2019 10:50 AM        NT pro-BNP  430  10/29/2019 11:32 AM                 THYROID  Lab Results      Component  Value  Date/Time        TSH  1.88  04/22/2020 08:52 AM                 Functional status and cognitive function:     Ambulates  without assistance.   Status: alert, cooperative, no distress, appears stated age   Condition: STABLE      Physical exam on day of discharge:      General:??Well-appearing, NAD.    CV: ??RRR, no M/G/R.   RESP:??Unlabored breathing. Lungs CTAB, no wheezes, rales or rhonchi appreciated. Equal expansion bilaterally.    ABD:????Soft, nontender, nondistended.    Ext:??R hand bandaged, c/d/i. R forearm and fingers are mildly edematous. Motor function and sensation intact in R hand.   Psych: Normal mood and affect.??       Code status and advanced care plan: Full         Point of Contact  Jerrica Bulluck   Relationship:??God-daughter   [phone]     ??      Patient Education:  Patient was educated on the following  topics prior to discharge: Cellulitis/abcess of the hand.      RISK CALCULATORS:      SCORE  RESULT        READMISSION RISK SCORE  Medium Risk              13 Total Score     4 IP Visits Last 12 Months (1-3=4, 4=9, >4=11)     9 Pt. Coverage (Medicare=5 , Medicaid, or Self-Pay=4)          Criteria that do not apply:     Has Seen PCP in Last 6 Months (Yes=3, No=0)     Married. Living with Significant Other. Assisted Living. LTAC. SNF. or    Rehab     Patient Length of Stay (>5 days = 3)     Charlson Comorbidity Score (Age + Comorbid Conditions)             Follow-up:      Follow-up Information               Follow up With  Specialties  Details  Why  Contact Info              [facility] [address]    On 07/15/2020  at 10:30 AM, Dr Redmond Pulling For wound re-check  [facility]              Izora Ribas, [place]      [address]   [address]   [place] [address]   [phone]                 Fara Olden, MD  Family Medicine  On 07/10/2020  At 2:40pm  [address]   [address]   [place] [address]   [phone]                   =================================================================        Fara Olden, MD, PGY-1     [facility] Medicine       July 07, 2020, 4:24 PM

## 2020-07-07 NOTE — Progress Notes (Signed)
Progress Notes by Lisbeth Ply, MD at 07/07/20 1034                Author: Lisbeth Ply, MD  Service: FAMILY MEDICINE  Author Type: Resident       Filed: 07/07/20 1239  Date of Service: 07/07/20 1034  Status: Attested           Editor: Lisbeth Ply, MD (Resident)  Cosigner: Quentin Angst, MD at 07/07/20 1816          Attestation signed by Quentin Angst, MD at 07/07/20 1816          I have seen and examined the patient.  I reviewed the resident's note and agree with assessment & plan below.        In addition, pt reports significant pain w/ OT today- requesting adjustment to pain medication.  We also spent time discussing her hx of mood disorder and suboptimally controlled anxiety, note hx bipolar; I do not have concerns for her immediate wellbeing,  psychotropic management deferred to Psychiatry.         Appreciate shared care w/ consultants; cleared by Orthopedics and ID for discharge home.  Plan for Augmentin until 07/18/20.        Clarify dx; Right third digit cellulitis with abscess s/p I&D on 07/05/20, hx hilar lymphadenopathy, hx bioprosthetic mitral valve, chronic systolic CHF- compensated, afib on chronic anticoagulation, CAD s/p CABG, chronic vertebral compression fractures  s/p kyphoplasty, tobacco dependency.                                           Intern Progress Note   [facility]              Patient: Mary [name]  MRN: [id number]   CSN: [id number]          Date of Birth: 01/11/1966   Age: 55 y.o.   Sex: female          DOA: 07/05/2020  LOS:  LOS: 2 days                          Subjective:            Acute events: NAEO.      Patient resting in reclining arm chair with NC in place, preparing to eat breakfast this AM. States she feels well and would like to go home. Reports that she felt moody yesterday, but starting nicotine patch helped. Endorses continued R hand pain but  it is controlled with medications.       ROS Denies fever/chills, SOB, abd pain, leg  pain.        Objective:        Patient Vitals for the past 24 hrs:            Temp  Pulse  Resp  BP  SpO2            07/07/20 0743  98.4 ??F (36.9 ??C)  69  16  114/60  98 %            07/07/20 0356  98.4 ??F (36.9 ??C)  78  18  (!) 91/52  96 %     07/06/20 1940  98.1 ??F (36.7 ??C)  89  18  104/64  95 %     07/06/20  1543  98.5 ??F (36.9 ??C)  77  18  (!) 99/59  98 %            07/06/20 1139  98.9 ??F (37.2 ??C)  84  18  112/68  95 %              Intake/Output Summary (Last 24 hours) at 07/07/2020 1035   Last data filed at 07/07/2020 0917     Gross per 24 hour        Intake  682 ml        Output  --        Net  682 ml           Physical Exam:    General: Well-appearing, NAD.    CV:  RRR, no M/G/R.   RESP: Unlabored breathing. Lungs CTAB, no wheezes, rales or rhonchi appreciated. Equal expansion bilaterally.    ABD:  Soft, nontender, nondistended.    Ext: R hand bandaged, c/d/i. R forearm and fingers are mildly edematous. Motor function and sensation intact in R hand.   Psych: Normal mood and affect.       Lab/Data Reviewed:     Recent Results (from the past 24 hour(s))     METABOLIC PANEL, BASIC          Collection Time: 07/07/20  4:48 AM         Result  Value  Ref Range            Sodium  141  136 - 145 mmol/L       Potassium  3.7  3.5 - 5.5 mmol/L       Chloride  113 (H)  100 - 111 mmol/L       CO2  24  21 - 32 mmol/L       Anion gap  4  3.0 - 18 mmol/L       Glucose  98  74 - 99 mg/dL       BUN  11  7.0 - 18 MG/DL       Creatinine  6.38  0.6 - 1.3 MG/DL       BUN/Creatinine ratio  15  12 - 20         GFR est AA  >60  >60 ml/min/1.82m2       GFR est non-AA  >60  >60 ml/min/1.21m2       Calcium  8.0 (L)  8.5 - 10.1 MG/DL       CBC WITH AUTOMATED DIFF          Collection Time: 07/07/20  4:48 AM         Result  Value  Ref Range            WBC  6.0  4.6 - 13.2 K/uL       RBC  3.46 (L)  4.20 - 5.30 M/uL       HGB  9.1 (L)  12.0 - 16.0 g/dL       HCT  45.3 (L)  64.6 - 45.0 %       MCV  88.2  78.0 - 100.0 FL       MCH  26.3  24.0 -  34.0 PG       MCHC  29.8 (L)  31.0 - 37.0 g/dL       RDW  80.3  21.2 - 14.5 %       PLATELET  273  135 - 420 K/uL  MPV  9.6  9.2 - 11.8 FL       NRBC  0.0  0 PER 100 WBC       ABSOLUTE NRBC  0.00  0.00 - 0.01 K/uL       NEUTROPHILS  50  40 - 73 %       LYMPHOCYTES  38  21 - 52 %       MONOCYTES  8  3 - 10 %       EOSINOPHILS  3  0 - 5 %       BASOPHILS  1  0 - 2 %       IMMATURE GRANULOCYTES  0  0.0 - 0.5 %       ABS. NEUTROPHILS  3.0  1.8 - 8.0 K/UL       ABS. LYMPHOCYTES  2.3  0.9 - 3.6 K/UL       ABS. MONOCYTES  0.5  0.05 - 1.2 K/UL       ABS. EOSINOPHILS  0.2  0.0 - 0.4 K/UL       ABS. BASOPHILS  0.1  0.0 - 0.1 K/UL       ABS. IMM. GRANS.  0.0  0.00 - 0.04 K/UL       DF  AUTOMATED          PROTHROMBIN TIME + INR          Collection Time: 07/07/20  4:48 AM         Result  Value  Ref Range            Prothrombin time  29.2 (H)  11.5 - 15.2 sec            INR  2.8 (H)  0.8 - 1.2               Assessment & Plan:        55 y.o. female with PMH PMH: CHF (50%), COPD, home O2 (2L), MV prosthetic, BP/PTSD, neck fusion, fibromyalgia, smoker, pAFIb presenting w/ Finger abscess + cellulitis.   ??   Right middle finger abscess and cellulitis likely secondary to mosquito bite   No systemic signs. S/p I&D R middle finger on 2/20.    PLAN   -Continue Rocephin and vancomycin, consider PO abx   -ID consulted, appreciate recs   -Ortho following - potential bedside decompression on 2/22   -Follow-up rapid Covid test   -Hold warfarin, but restart after procedure per pharmacy dosing or use Lovenox   -May need to give vitamin K or FFP if INR elevated   -PT/OT   -Morphine 1 mg every 4 hours as needed   ??   COPD/history of Covid pneumonia on home O2 2 L as needed   Current smoker 10 cigarettes a day   -Continue DuoNebs   -Continue fluticasone and Zyrtec   -Continue oxygen as needed nasal cannula   -Patient does not want nicotine patch   ??   CHF last EF 50%+prosthetic mitral valve   Sees Dr. Norva KarvonenVenkat outpatient.   -Continue metoprolol  succinate 25 daily   -Patient not on ACE or ARB   -Patient on as needed Lasix for swelling 40 mg   -Hold warfarin for now procedure   ??   GERD   -Continue PPI   ??   PTSD bipolar Fibromyalgia   -Continue lamotrigine 75 mg daily   -Continue Lyrica 200mg   times a day   Continue Flexeril as needed at bedtime  5 mg   ??   Hyperlipidemia   Continue pravastatin 40 mg daily      Nicotine Dependence   Patient states she smokes 10 cigarettes/day.   -Nicotine patch ordered   ??   ??      Diet  NPO for procedure     DVT Prophylaxis  SCD     GI Prophylaxis  PPI     Code status  Full     Disposition  TBD     ??      Point of Contact  Jerrica Bulluck   Relationship: god daughter   [phone]              Lisbeth Ply, MD , PGY-1       [facility]       July 07, 2020, 10:30 AM

## 2020-07-07 NOTE — Progress Notes (Signed)
07/07/20 1006   Critical Result Types   Type of Critical Result Laboratory   Critical Lab Result Types   Critical Lab Value   (wound in right finger growing group A strep)   Notification Information   Notified By (Name)   (lab)   Time of Critical Result Notification 0908   Verbal Readback Provided Yes   Provider Notification   Was Provider Notified   (PFM)

## 2020-07-07 NOTE — Progress Notes (Signed)
Infectious Disease progress Note        Reason: Right middle finger infection    Current abx Prior abx   Ceftriaxone, vancomycin since 2/20  Bactrim, cephalexin on 2/19     Lines:       Assessment :    55 y.o.??female??with bioprosthetic mitral valve replacement/Maze procedure/LAA appendage ligation in 2017, Afib(on warfarin bridged with Lovenox), HTN, Smoker,??Bipolar 1 disorder/PTSD,??stable angina pectoris(s/p LHC)??presented to [facility] on 07/05/2020 with right middle finger swelling  ??    Hospitalization December 2021 for acute hypoxia-present on admission due to COVID-19 pneumonia     Clinical presentation consistent with right middle finger abscess  Status post I&D on 07/05/2020.  Intra-Op cultures group A streptococcus    Ortho consult appreciated.  Intra-Op findings discussed with Ortho. Minimal purulent drainage from dorsal finger and inflammation about middle finger flexor tendon sheath.  ??  Recommendations:  ??  1.    D/c ceftriaxone, vancomycin   2.   ok to switch to po augmentin till 07/18/20  3.  Follow-up Ortho surgery recommendations  4.  Probiotics while on antibiotics  5. No need for droplet/ contact isolation     Above plan was discussed in details with patient, dr. Andrey Campanile and dr Lester Kinsman. Please call me if any further questions or concerns. Will continue to participate in the care of this patient.  HPI:    Feels better. C/o pain at site of recent surgery  Patient denies any fever or chills at home      Past Medical History:   Diagnosis Date   ??? Anxiety    ??? Bipolar 1 disorder (HCC)    ??? CHF (congestive heart failure) (HCC)    ??? COPD (chronic obstructive pulmonary disease) (HCC)    ??? COPD (chronic obstructive pulmonary disease) (HCC)    ??? H/O aortic valve replacement    ??? PTSD (post-traumatic stress disorder)        Past Surgical History:   Procedure Laterality Date   ??? HX HEART CATHETERIZATION     ??? HX HEART VALVE SURGERY      mitral valve replacement       Current Discharge Medication List      CONTINUE these  medications which have NOT CHANGED    Details   aspirin 81 mg chewable tablet Take 81 mg by mouth daily.      furosemide (Lasix) 40 mg tablet Take 40 mg by mouth daily as needed (swelling).      cephALEXin (Keflex) 500 mg capsule Take 1 Capsule by mouth four (4) times daily for 7 days.  Qty: 28 Capsule, Refills: 0      trimethoprim-sulfamethoxazole (Bactrim DS) 160-800 mg per tablet Take 1 Tablet by mouth two (2) times a day for 7 days.  Qty: 14 Tablet, Refills: 0      pregabalin (LYRICA) 200 mg capsule Take 1 Capsule by mouth three (3) times daily.  Qty: 90 Capsule, Refills: 2    Associated Diagnoses: Chronic pain syndrome      ipratropium-albuteroL (Combivent Respimat) 20-100 mcg/actuation inhaler Take 1 Puff by inhalation every six (6) hours.  Qty: 4 g, Refills: 3      fluticasone propionate (FLONASE) 50 mcg/actuation nasal spray 1 spray by nasal route daily  Qty: 1 Each, Refills: 2      hydrOXYzine HCL (ATARAX) 25 mg tablet Take 25 mg by mouth two (2) times daily as needed for Anxiety.      acetaminophen (Tylenol Extra Strength) 500 mg  tablet Take 1,000 mg by mouth every eight (8) hours as needed for Pain.      albuterol (PROVENTIL HFA, VENTOLIN HFA, PROAIR HFA) 90 mcg/actuation inhaler Take 2 Puffs by inhalation every six (6) hours as needed.      cetirizine (ZYRTEC) 10 mg tablet Take 10 mg by mouth daily.      lamoTRIgine (LaMICtal) 25 mg tablet 75 mg nightly.      omeprazole (PRILOSEC) 40 mg capsule Take 40 mg by mouth daily.      warfarin (COUMADIN) 2.5 mg tablet 2.5 mg. Takes 5mg  MWF and takes 3.75 mg Tue, Thrs, Sat, Sunday      cyclobenzaprine (FLEXERIL) 5 mg tablet Take 1 Tablet by mouth nightly as needed for Muscle Spasm(s).  Qty: 30 Tablet, Refills: 1    Associated Diagnoses: Chronic pain syndrome      metoprolol succinate (TOPROL-XL) 25 mg XL tablet Take 25 mg by mouth daily.      pravastatin (PRAVACHOL) 40 mg tablet Take 40 mg by mouth nightly.               Current Facility-Administered Medications    Medication Dose Route Frequency   ??? lamoTRIgine (LaMICtal) tablet 75 mg  75 mg Oral DAILY   ??? nicotine (NICODERM CQ) 14 mg/24 hr patch 1 Patch  1 Patch TransDERmal DAILY   ??? oxyCODONE-acetaminophen (PERCOCET) 5-325 mg per tablet 1 Tablet  1 Tablet Oral Q4H PRN   ??? sodium chloride (NS) flush 5-10 mL  5-10 mL IntraVENous PRN   ??? cefTRIAXone (ROCEPHIN) 1 g in sterile water (preservative free) 10 mL IV syringe  1 g IntraVENous Q24H   ??? vancomycin (VANCOCIN) 1,000 mg in 0.9% sodium chloride 250 mL (VIAL-MATE)  1,000 mg IntraVENous Q12H   ??? fluticasone propionate (FLONASE) 50 mcg/actuation nasal spray 2 Spray  2 Spray Both Nostrils DAILY   ??? furosemide (LASIX) tablet 40 mg  40 mg Oral DAILY PRN   ??? hydrOXYzine HCL (ATARAX) tablet 25 mg  25 mg Oral BID PRN   ??? cetirizine (ZYRTEC) tablet 10 mg  10 mg Oral DAILY   ??? cyclobenzaprine (FLEXERIL) tablet 5 mg  5 mg Oral QHS PRN   ??? pantoprazole (PROTONIX) tablet 40 mg  40 mg Oral DAILY   ??? pravastatin (PRAVACHOL) tablet 40 mg  40 mg Oral QHS   ??? pregabalin (LYRICA) capsule 200 mg  200 mg Oral TID   ??? sodium chloride (NS) flush 5-40 mL  5-40 mL IntraVENous Q8H   ??? sodium chloride (NS) flush 5-40 mL  5-40 mL IntraVENous PRN   ??? polyethylene glycol (MIRALAX) packet 17 g  17 g Oral DAILY PRN   ??? WARFARIN;  Pharmacy Dosing   Other Q24H       Allergies: Adhesive and Lithium    Family History   Problem Relation Age of Onset   ??? No Known Problems Mother      Social History     Socioeconomic History   ??? Marital status: DIVORCED     Spouse name: Not on file   ??? Number of children: Not on file   ??? Years of education: Not on file   ??? Highest education level: Not on file   Occupational History   ??? Not on file   Tobacco Use   ??? Smoking status: Current Every Day Smoker     Packs/day: 1.00   ??? Smokeless tobacco: Never Used   Substance and Sexual Activity   ??? Alcohol use:  Never   ??? Drug use: Yes     Types: Marijuana   ??? Sexual activity: Not on file   Other Topics Concern   ??? Not on file    Social History Narrative   ??? Not on file     Social Determinants of Health     Financial Resource Strain:    ??? Difficulty of Paying Living Expenses: Not on file   Food Insecurity:    ??? Worried About Running Out of Food in the Last Year: Not on file   ??? Ran Out of Food in the Last Year: Not on file   Transportation Needs:    ??? Lack of Transportation (Medical): Not on file   ??? Lack of Transportation (Non-Medical): Not on file   Physical Activity:    ??? Days of Exercise per Week: Not on file   ??? Minutes of Exercise per Session: Not on file   Stress:    ??? Feeling of Stress : Not on file   Social Connections:    ??? Frequency of Communication with Friends and Family: Not on file   ??? Frequency of Social Gatherings with Friends and Family: Not on file   ??? Attends Religious Services: Not on file   ??? Active Member of Clubs or Organizations: Not on file   ??? Attends Banker Meetings: Not on file   ??? Marital Status: Not on file   Intimate Partner Violence:    ??? Fear of Current or Ex-Partner: Not on file   ??? Emotionally Abused: Not on file   ??? Physically Abused: Not on file   ??? Sexually Abused: Not on file   Housing Stability:    ??? Unable to Pay for Housing in the Last Year: Not on file   ??? Number of Places Lived in the Last Year: Not on file   ??? Unstable Housing in the Last Year: Not on file     Social History     Tobacco Use   Smoking Status Current Every Day Smoker   ??? Packs/day: 1.00   Smokeless Tobacco Never Used        Temp (24hrs), Avg:98.5 ??F (36.9 ??C), Min:98.1 ??F (36.7 ??C), Max:98.9 ??F (37.2 ??C)    Visit Vitals  BP 114/60   Pulse 69   Temp 98.4 ??F (36.9 ??C)   Resp 16   Ht 5\' 3"  (1.6 m)   Wt 93.4 kg (205 lb 12.8 oz)   SpO2 98%   BMI 36.46 kg/m??       ROS: 12 point ROS obtained in details. Pertinent positives as mentioned in HPI,   otherwise negative    Physical Exam:      Vitals signs??and nursing note??reviewed.   Constitutional: ??  Sitting on bed.  Alert awake oriented x3, appears more comfortable than prior  exam  HENT:   ??????Head: Normocephalic.   Eyes:   ??????Conjunctiva/sclera: Conjunctivae normal.   ??????Neck:   ??????Musculoskeletal: Normal range of motion??and neck supple.   Cardiovascular:   ??????Rate and Rhythm: Normal rate??and regular rhythm on monitor  Chest:   ??????Bilateral chest movements equal.    Abdominal:   ??????General: There is no??distension.   ??????Palpations: Abdomen is soft.   ??????Tenderness: There is no abdominal tenderness. There is no??rebound.   Musculoskeletal:??Normal range of motion. ??????  Right middle finger surgical dressing not removed.  blister medial aspect of distal phalanx of right index finger without overlying tenderness  Skin:  ??????General: Skin is warm??and dry.   ??????  Findings: No rash.   Neurological:   ??????Mental Status:  alert??and oriented to person, place, and time.   ??????Cranial Nerves: No cranial nerve deficit.   ??????Motor: No abnormal muscle tone.   ??????Coordination: Coordination normal.   Psychiatric: ??????  ????emotionally labile  Labs: Results:   Chemistry Recent Labs     07/07/20  0448 07/06/20  0923 07/06/20  0400 07/05/20  1253 07/05/20  1253   GLU 98  --  106*  --  94   NA 141  --  136  --  136   K 3.7 4.4 HEMOLYZED,RECOLLECT REQUESTED   < > 4.0   CL 113*  --  111  --  106   CO2 24  --  20*  --  23   BUN 11  --  15  --  9   CREA 0.72  --  0.79  --  0.86   CA 8.0*  --  8.6  --  9.3   AGAP 4  --  5  --  7   BUCR 15  --  19  --  10*   AP  --   --   --   --  69   TP  --   --   --   --  8.0   ALB  --   --   --   --  3.4   GLOB  --   --   --   --  4.6*   AGRAT  --   --   --   --  0.7*    < > = values in this interval not displayed.      CBC w/Diff Recent Labs     07/07/20  0448 07/06/20  0400 07/05/20  1253   WBC 6.0 6.3 10.3   RBC 3.46* 3.61* 4.39   HGB 9.1* 9.8* 11.4*   HCT 30.5* 32.2* 37.3   PLT 273 311 356   GRANS 50 80* 75*   LYMPH 38 17* 18*   EOS 3 0 1      Microbiology Recent Labs     07/05/20  1915 07/05/20  1908 07/05/20  1250 07/05/20  1205   CULT PENDING PENDING NO GROWTH 2 DAYS NO GROWTH 2  DAYS          RADIOLOGY:    All available imaging studies/reports in connect care for this admission were reviewed      Disclaimer: Sections of this note are dictated utilizing voice recognition software, which may have resulted in some phonetic based errors in grammar and contents. Even though attempts were made to correct all the mistakes, some may have been missed, and remained in the body of the document. If questions arise, please contact our department.    Dr. Raiford SimmondsManali Larence Thone, Infectious Disease Specialist  [phone]  July 07, 2020  11:41 AM

## 2020-07-07 NOTE — Progress Notes (Signed)
Problem: Self Care Deficits Care Plan (Adult)  Goal: *Acute Goals and Plan of Care (Insert Text)  Description: Occupational Therapy Goals  Initiated 07/07/2020 within 7 day(s).    1.  Patient will perform gentle AROM of R digits and wrist in all planes following there ex routine with modified independence.      Prior Level of Function: Independent in all ADLs and IADLs     Outcome: Progressing Towards Goal   OCCUPATIONAL THERAPY EVALUATION    Patient: Mary [name] (55 y.o. female)  Date: 07/07/2020  Primary Diagnosis: Cellulitis and abscess of hand [L03.119, L02.519]  Abscess of finger [L02.519]  Procedure(s) (LRB):  INCISION AND DRAINAGE RIGHT MIDDLE FINGER (Right) 2 Days Post-Op   Precautions:    (standard)  PLOF: see above    ASSESSMENT :  Pt sitting in arm chair in room and agreeable to OT evaluation. Pt educated on gentle AROM of R middle digit and hand/wrist for increased healing and function. Pt with bulky dressing on middle finger and palm/dorsum of hand past wrist restricting AROM. Pt provided with AROM hand out and performed composite fist, digit extension, thumb opposition, digit abduction/adduction x 5. Pt instructed on performing within pain parameters and timing with medication administration to reduce discomfort. Pt complaining of pain 4.5/10 increasing steadily and requested pain medication. Pt offered ice, however, pt refused. Pt elevating RUE appropriately. Pt RN informed. Pt with all needs in reach at end of session.     Patient will benefit from skilled intervention to address the above impairments.  Patient's rehabilitation potential is considered to be Good  Factors which may influence rehabilitation potential include:   [x]              None noted  []              Mental ability/status  []              Medical condition  []              Home/family situation and support systems  []              Safety awareness  []              Pain tolerance/management  []              Other:      PLAN  :  Recommendations and Planned Interventions:   []                Self Care Training                  [x]       Therapeutic Activities  []                Functional Mobility Training   []       Cognitive Retraining  [x]                Therapeutic Exercises           []       Endurance Activities  []                Balance Training                    []       Neuromuscular Re-Education  []                Visual/Perceptual Training     []       Home Safety Training  [x]   Patient Education                   []       Family Training/Education  []                Other (comment):    Frequency/Duration: Patient will be followed by occupational therapy 1-2 times per day/4-7 days per week to address goals.  Discharge Recommendations: Outpatient, certified hand therapist   Further Equipment Recommendations for Discharge: N/A     SUBJECTIVE:   Patient stated "I want to go home really bad."    OBJECTIVE DATA SUMMARY:     Past Medical History:   Diagnosis Date    Anxiety     Bipolar 1 disorder (HCC)     CHF (congestive heart failure) (HCC)     COPD (chronic obstructive pulmonary disease) (HCC)     COPD (chronic obstructive pulmonary disease) (HCC)     H/O aortic valve replacement     PTSD (post-traumatic stress disorder)      Past Surgical History:   Procedure Laterality Date    HX HEART CATHETERIZATION      HX HEART VALVE SURGERY      mitral valve replacement     Barriers to Learning/Limitations: None  Compensate with: visual, verbal, tactile, kinesthetic cues/model    Home Situation:   Home Situation  Home Environment: Private residence  # Steps to Enter: 4  Rails to Enter: Yes  Wheelchair Ramp: No  One/Two Story Residence: One story  Living Alone: No  Support Systems: Other Family Member(s)  Patient Expects to be Discharged to:: Home  Current DME Used/Available at Home: None  [x]   Right hand dominant   []   Left hand dominant    Cognitive/Behavioral Status:  Neurologic State: Alert  Orientation Level: Oriented X4  Cognition:  Follows commands;Appropriate decision making;Appropriate for age attention/concentration  Safety/Judgement: Home safety    Skin:   unable to assess due to bandaging  Edema:   Unable to assess due to bandaging     Vision/Perceptual:       intact     Coordination: BUE  Coordination: Generally decreased, functional (limited by bandaging RUE)  Fine Motor Skills-Upper: Right Impaired;Left Intact    Gross Motor Skills-Upper: Left Intact;Right Intact  Balance:  Sitting: Intact  Strength: BUE  Strength: Generally decreased, functional (R hand and wrist, limited by bandaging)   Tone & Sensation: BUE  Tone: Normal  Sensation: Intact   Range of Motion: BUE  AROM: Generally decreased, functional (R hand and wrist, limited by bandaging)    ADL Assessment:   Feeding: Setup    Oral Facial Hygiene/Grooming: Setup    ADL Intervention:     Cognitive Retraining  Safety/Judgement: Home safety  Pain:  Pain level pre-treatment: 4.5/10   Pain level post-treatment: 4.5/10   Pain Intervention(s): Medication (see MAR); Rest, Repositioning   Response to intervention: Nurse notified, See doc flow    Activity Tolerance:   Good   Please refer to the flowsheet for vital signs taken during this treatment.  After treatment:   [x]  Patient left in no apparent distress sitting up in chair  []  Patient left in no apparent distress in bed  [x]  Call bell left within reach  [x]  Nursing notified  []  Caregiver present  []  Bed alarm activated    COMMUNICATION/EDUCATION:   [x]  Role of Occupational Therapy in the acute care setting  [x]  Home safety education was provided and the patient/caregiver indicated understanding.  [  x] Patient/family have participated as able in goal setting and plan of care.  [x]  Patient/family agree to work toward stated goals and plan of care.  []  Patient understands intent and goals of therapy, but is neutral about his/her participation.  []  Patient is unable to participate in goal setting and plan of care.    Thank you for this  referral.  Johny Blamer, OT  Time Calculation: 14 mins    Eval Complexity: History: LOW Complexity : Brief history review ;   Examination: LOW Complexity : 1-3 performance deficits relating to physical, cognitive , or psychosocial skils that result in activity limitations and / or participation restrictions ;   Decision Making:LOW Complexity : No comorbidities that affect functional and no verbal or physical assistance needed to complete eval tasks

## 2020-07-08 LAB — CULTURE, ANAEROBIC

## 2020-07-08 LAB — CULTURE, BODY FLUID W GRAM STAIN
GRAM STAIN: NONE SEEN
GRAM STAIN: NONE SEEN

## 2020-07-08 LAB — SARS-COV-2 BY NAA: SARS-CoV-2, NAA: NOT DETECTED

## 2020-07-08 LAB — CULTURE, BODY FLUID
Gram Stain Result: NONE SEEN
Gram Stain Result: NONE SEEN

## 2020-07-08 NOTE — Telephone Encounter (Signed)
She should have been discharged home with only a band aid on her dorsal middle finger.  Please instruct her to soak it in peroxide and take it off.  She won't do any damage to the tissue underneath.  I recommend she take some pain medicine beforehand and in the future only put band aids on it.  If the gauze wont come off initially, tell her not to worry, it will eventually come off when washing her hand or in the shower.

## 2020-07-08 NOTE — Telephone Encounter (Signed)
 Patient called in stating the gauze is stuck to her right middle finger and she is unsure on what to do    She stated she took the surgical bandages off and soaked her finger herself and she believes something is wrong     She's requesting a call back at [phone]

## 2020-07-08 NOTE — Telephone Encounter (Signed)
Informed pt of message. Pt voiced understanding.

## 2020-07-08 NOTE — Progress Notes (Signed)
Transitions of Care Call  Call within 2 business days of discharge: Yes     Patient: Mary [name] Patient DOB: August 28, 1965 MRN: [id number]    Last Discharge [facility] Facility       Complaint Diagnosis Description Type Department Provider    07/05/20 Finger Swelling Abscess of finger of right hand ED to Hosp-Admission (Discharged) (ADMIT) [id number] Quentin Angst, MD; Adron Bene...          Was this an external facility discharge? No Discharge Facility: N/A    Challenges to be reviewed by the provider   Additional needs identified to be addressed with provider no  none           Encounter was not routed to provider for escalation. Method of communication with provider: none.    Patient voiced she is currently at doctor's office to remove dressing that has adered to surgical finger. CTN will attempt to reach at a later time. Will attempt reach at a later time.        Follow up appointment scheduled within 7 days of discharge: yes. If no appointment scheduled, scheduling offered: no.  Future Appointments   Date Time Provider Department Center   07/15/2020 10:45 AM Spero Geralds A, DO [facility] BS AMB   07/15/2020  1:30 PM [facility] BS AMB   07/15/2020  2:30 PM [facility]OMET[facility] AMB   07/15/2020  3:15 PM Abelina Bachelor, MD [facility] BS AMB         Fara Boros, RN

## 2020-07-09 NOTE — Progress Notes (Signed)
Care Transitions Initial Call    Call within 2 business days of discharge: Yes     Patient: Mary [name] Patient DOB: 20-Jul-1965 MRN: [id number]    Last Discharge [facility] Facility       Complaint Diagnosis Description Type Department Provider    07/05/20 Finger Swelling Abscess of finger of right hand ED to Hosp-Admission (Discharged) (ADMIT) [id number] Quentin Angst, MD; Adron Bene...          Was this an external facility discharge? No Discharge Facility: N/A    Challenges to be reviewed by the provider   Additional needs identified to be addressed with provider: no  none         Method of communication with provider : none     COVID-19 related testing which was not done at this time.   Test results were not done.   Patient informed of results, if available? N/A     Advance Care Planning:   Does patient have an Advance Directive:  not on file    Inpatient Readmission Risk score: Unplanned Readmit Risk Score: 17.9 ( )    Was this a readmission? no   Patient stated reason for the admission: N/A    Care Transition Nurse (CTN) contacted the patient by telephone to perform post hospital discharge assessment. Second attempt. No answer. Left HIPPA compliant message. Name, role and contact information provided. Requested return call.  No PHI on file. Will attempt to contact at a later time.

## 2020-07-10 LAB — CULTURE, WOUND W GRAM STAIN
Culture result:: NO GROWTH
Culture: NO GROWTH
GRAM STAIN: NONE SEEN
GRAM STAIN: NONE SEEN
Gram Stain Result: NONE SEEN
Gram Stain Result: NONE SEEN

## 2020-07-10 LAB — CULTURE, ANAEROBIC
Culture result:: NO GROWTH
Culture: NO GROWTH

## 2020-07-11 LAB — CULTURE, BLOOD
Culture result:: NO GROWTH
Culture result:: NO GROWTH

## 2020-07-11 LAB — CULTURE, BLOOD 1
Culture: NO GROWTH
Culture: NO GROWTH

## 2020-07-15 ENCOUNTER — Ambulatory Visit: Payer: MEDICARE | Attending: Critical Care Medicine | Primary: Family Medicine

## 2020-07-15 ENCOUNTER — Ambulatory Visit: Admit: 2020-07-15 | Discharge: 2020-07-15 | Payer: MEDICARE | Attending: Orthopaedic Surgery | Primary: Family Medicine

## 2020-07-15 ENCOUNTER — Ambulatory Visit: Payer: MEDICARE | Primary: Family Medicine

## 2020-07-15 ENCOUNTER — Ambulatory Visit: Attending: Orthopaedic Surgery | Primary: Family Medicine

## 2020-07-15 DIAGNOSIS — Z9889 Other specified postprocedural states: Secondary | ICD-10-CM

## 2020-07-15 NOTE — Progress Notes (Signed)
Mary [name] is a 55 y.o. female right handed individual, not currently working.  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    Vitals:    07/15/20 1034   Pulse: 78   Resp: 15   Temp: 97.5 ??F (36.4 ??C)   SpO2: 97%   Weight: 194 lb (88 kg)   Height: 5\' 3"  (1.6 m)   PainSc:   3   PainLoc: Finger           Chief Complaint   Patient presents with   ??? Hand Pain     right middle finger         HPI: Patient presents today for follow-up of right middle finger I&D of the dorsum of the finger as well as the flexor tendon sheath.  She reports to be doing well and has completed her antibiotics.  She denies any recurrence of pain.  She reports some stiffness in the finger and she is doing some exercises at home.  She reports no one is called her about therapy yet.    Date of onset: Date of surgery 07/05/2020    Injury: Unknown    Prior Treatment:  Yes: Comment: Debridement surgical    Numbness/ Tingling: No      ROS: Review of Systems - General ROS: negative  Psychological ROS: negative  ENT ROS: negative  Allergy and Immunology ROS: negative  Hematological and Lymphatic ROS: negative  Respiratory ROS: no cough, shortness of breath, or wheezing  Cardiovascular ROS: no chest pain or dyspnea on exertion  Gastrointestinal ROS: no abdominal pain, change in bowel habits, or black or bloody stools  Musculoskeletal ROS: negative  Neurological ROS: negative  Dermatological ROS: negative    Past Medical History:   Diagnosis Date   ??? Anxiety    ??? Bipolar 1 disorder (HCC)    ??? CHF (congestive heart failure) (HCC)    ??? COPD (chronic obstructive pulmonary disease) (HCC)    ??? COPD (chronic obstructive pulmonary disease) (HCC)    ??? H/O aortic valve replacement    ??? PTSD (post-traumatic stress disorder)        Past Surgical History:   Procedure Laterality Date   ??? HAND/FINGER SURGERY UNLISTED Right     I&D right middle finger   ??? HX HEART CATHETERIZATION     ??? HX HEART VALVE SURGERY      mitral valve replacement       Current  Outpatient Medications   Medication Sig Dispense Refill   ??? amoxicillin-clavulanate (Augmentin) 875-125 mg per tablet Take 1 Tablet by mouth every twelve (12) hours for 11 days. 22 Tablet 0   ??? aspirin 81 mg chewable tablet Take 81 mg by mouth daily.     ??? furosemide (Lasix) 40 mg tablet Take 40 mg by mouth daily as needed (swelling).     ??? pregabalin (LYRICA) 200 mg capsule Take 1 Capsule by mouth three (3) times daily. 90 Capsule 2   ??? cyclobenzaprine (FLEXERIL) 5 mg tablet Take 1 Tablet by mouth nightly as needed for Muscle Spasm(s). 30 Tablet 1   ??? ipratropium-albuteroL (Combivent Respimat) 20-100 mcg/actuation inhaler Take 1 Puff by inhalation every six (6) hours. 4 g 3   ??? fluticasone propionate (FLONASE) 50 mcg/actuation nasal spray 1 spray by nasal route daily 1 Each 2   ??? hydrOXYzine HCL (ATARAX) 25 mg tablet Take 25 mg by mouth two (2) times daily as needed for Anxiety.     ??? acetaminophen (Tylenol Extra Strength) 500  mg tablet Take 1,000 mg by mouth every eight (8) hours as needed for Pain.     ??? albuterol (PROVENTIL HFA, VENTOLIN HFA, PROAIR HFA) 90 mcg/actuation inhaler Take 2 Puffs by inhalation every six (6) hours as needed.     ??? cetirizine (ZYRTEC) 10 mg tablet Take 10 mg by mouth daily.     ??? lamoTRIgine (LaMICtal) 25 mg tablet 75 mg nightly.     ??? metoprolol succinate (TOPROL-XL) 25 mg XL tablet Take 25 mg by mouth daily.     ??? omeprazole (PRILOSEC) 40 mg capsule Take 40 mg by mouth daily.     ??? pravastatin (PRAVACHOL) 40 mg tablet Take 40 mg by mouth nightly.       ??? warfarin (COUMADIN) 2.5 mg tablet 2.5 mg. Takes 5mg  MWF and takes 3.75 mg Tue, Thrs, Sat, Sunday         Allergies   Allergen Reactions   ??? Adhesive Rash   ??? Lithium Nausea and Vomiting           PE:     Physical Exam  Vitals and nursing note reviewed.   Constitutional:       General: She is not in acute distress.     Appearance: Normal appearance. She is not ill-appearing.   Cardiovascular:      Pulses: Normal pulses.   Pulmonary:       Effort: Pulmonary effort is normal. No respiratory distress.   Musculoskeletal:         General: Tenderness present. No swelling, deformity or signs of injury.      Cervical back: Normal range of motion.      Right lower leg: No edema.      Left lower leg: No edema.   Skin:     General: Skin is warm and dry.      Capillary Refill: Capillary refill takes less than 2 seconds.      Findings: No bruising or erythema.   Neurological:      General: No focal deficit present.      Mental Status: She is alert and oriented to person, place, and time.   Psychiatric:         Mood and Affect: Mood normal.         Behavior: Behavior normal.            Right hand: The dorsum of the middle finger is showing significant maturing granulation tissue.  The incisions about the volar middle finger and palm are healed with sutures in place.  Sutures have subsequently been removed today.  Neurovascularly intact distally.  Range of motion limited with 20 degrees of terminal extension that is passively correctable.  Near full flexion noted of the middle finger.      Imaging:     None indicated today.        ICD-10-CM ICD-9-CM    1. S/P debridement  Z98.890 V45.89 REFERRAL TO OCCUPATIONAL THERAPY   2. Abrasion of right middle finger with infection  S60.412A 915.1 REFERRAL TO OCCUPATIONAL THERAPY    L08.9     3. Suppurative tenosynovitis of flexor tendon of right hand  M65.141 727.05 REFERRAL TO OCCUPATIONAL THERAPY         Plan:     Discussed range of motion exercises.  OT ordered.    Follow-up and Dispositions    ?? Return if symptoms worsen or fail to improve.          Plan was reviewed with patient, who  verbalized agreement and understanding of the plan

## 2020-07-16 ENCOUNTER — Encounter

## 2020-07-16 MED ORDER — OXYCODONE-ACETAMINOPHEN 5 MG-325 MG TAB
5-325 mg | ORAL_TABLET | Freq: Four times a day (QID) | ORAL | 0 refills | Status: DC | PRN
Start: 2020-07-16 — End: 2020-07-22

## 2020-07-16 NOTE — Telephone Encounter (Signed)
Patient called for Dr.Arora.     Patient said that for the last four days she has been having a different kind of pain on the Base of her Neck. That it is different from what Dr.Arora has seen her for.     Patient said that she usually does some sort of neck exercise. That she usually moves her neck from side to side, But yesterday she felt dizzy and almost passed out. That she did go to see her PCP. That she is not sure if the reason was due to oxygen or if something with her neck could have caused it.    Patient said she was supposed to get a Bone Density test, but she got Covid and was never able to get it. That she needs to reschedule that appt.     Patient would like to know if Dr. Wilford Corner wants to see her or if she wants to order her some sort of xray for her Neck.    Patient would like a call back at  [facility]. [phone].

## 2020-07-16 NOTE — Telephone Encounter (Signed)
We can probably get her in sooner if she is willing to come to the [facility] office.  Please see if this would be ok.

## 2020-07-16 NOTE — Telephone Encounter (Signed)
Informed pt of message.     Pt stated she couldn't get an appointment with physical therapy until March 21st. She is asking if this is ok or too far away.

## 2020-07-16 NOTE — Telephone Encounter (Signed)
Just sent it

## 2020-07-16 NOTE — Addendum Note (Signed)
Addendum  Note by Peggyann Shoals, DO at 07/16/20 1045                Author: Peggyann Shoals, DO  Service: --  Author Type: Physician       Filed: 07/16/20 1302  Encounter Date: 07/16/2020  Status: Signed          Editor: Peggyann Shoals, DO (Physician)          Addended by: Spero Geralds A on: 07/16/2020 01:02 PM    Modules accepted: Orders

## 2020-07-16 NOTE — Telephone Encounter (Signed)
Pt stated she will be willing to go to [facility]. She was offered [facility] but didn't think about it at the time. She will call and to switch her appointment to [facility] for a sooner date.

## 2020-07-16 NOTE — Progress Notes (Signed)
sent 

## 2020-07-16 NOTE — Telephone Encounter (Signed)
I called and spoke to Ms. Bascom Levels. She was informed of the provider's response to her recent message. The pt was asked if she has gotten the bone scan done yet. The pt stated that she has not. She will call the scheduling dept to get this scheduled. She will call the office back once she does this and then schedule an office follow up. The pt states that she has the number to the scheduling dept.

## 2020-07-16 NOTE — Telephone Encounter (Signed)
 Patient called in stating she needs a rx for pain medication for her finger pain    Patient's contact is [phone]

## 2020-07-16 NOTE — Telephone Encounter (Signed)
 Attempted to contact pt regarding her message. She was not able to be reached. A message could not be left for the pt. Message said on phone caller unavaiable, please try again later

## 2020-07-16 NOTE — Progress Notes (Signed)
Care Transitions Follow Up Call    Challenges to be reviewed by the provider   Additional needs identified to be addressed with provider: yes  medications- Patient reported 7/10 pain since suture removal. Does not have Percocet at home. Also reported nausea. Denied all other s/s of infection. Patient request Rx for Percocet to be called in to [facility] on [address]. Patient also reported nausea X 2 days. Patient denied vomiting or any other urinary s/s.            Method of communication with provider : chart routing    Care Transition Nurse (CTN) contacted the patient by telephone to follow up after admission on 07/05/20. Verified name and DOB with patient as identifiers.    Addressed changes since last contact: medications- Patient requested refill on Percocet. Patient repotred pain has increased from 3/10 to 7/10 since suture removal on 3/2. Patient also repotred there are 2 open areas on surgical wound. One on the middle finger and one in the palm. Denied increased redness, bleeding, pus, warmth, swelling, vomiting, diarrhea, CP, increased SOB. Patient did report nausea X 2 days. Denied any s/s of UTI. Patient does not have thermometer at home. Patient voiced she threw out her thermometer after having COVID. Encouraged patient to purchase one.     Follow up appointment completed? yes.   Was follow up appointment scheduled within 7 days of discharge? no.     Advance Care Planning:   Does patient have an Advance Directive:  not on file    CTN reviewed discharge instructions, medical action plan and red flags with patient and discussed any barriers to care and/or understanding of plan of care after discharge. Discussed appropriate site of care based on symptoms and resources available to patient including: Specialist, When to call 911 and CTN. The patient agrees to contact the PCP office for questions related to their healthcare.     Patients top risk factors for readmission: medical condition-nausea and pain    Interventions to address risk factors: Assessed patient for infection and UTI. Chart forwarded to orthopedic surgeon.     [facility] follow up appointment(s): No future appointments.  Non-[facility] follow up appointment(s): none    CTN provided contact information for future needs. Plan for follow-up call in 1-2 days based on severity of symptoms and risk factors.  Plan for next call: symptom management-pain, right middle finger; assess for infection and follow up on request for Percocet     Goals Addressed                 This Visit's Progress    . Attends follow-up appointments as directed.        Goal: Patient will attend all appointments scheduled within the next 30 days.      Ensure provider appt is scheduled within 7 business days post-discharge  2/24: TOC appt scheduled for 07/10/20 @ 240 PM   Confirm patient attended post-discharge provider appt ; 3/3: Patient attended ortho follow up on 3/2  Complete post-visit call to confirm attendance and update care needs; 3/3: Done          . Coordinate Pain Management Plan for Patient.       . Coordinate surgical pain management        Obtain and maintain tolerable level of pain.    Pain goal: 3/10  3/3: Patient reported 7/10 pain to surgical incision since suture removal on 3/2. Patient voiced she is out of Percocet. CTN sent  request for Percocet. CTN verified pharmacy on file.       . Prevent complications post hospitalization.        Goal: No admissions post 30 days from discharge of 07/10/20.      Review/educate common or potential "red flags" of condition worsening; 3/3: redness, warmth at site(s), swelling, bleeding or pus-like drainage, chills, fever (> 100.5), low body temperature (<97.2), nausea/vomiting that prevents eating/drinking/taking medications, SOB, chest pain/pressure, increased respirations, decreased urine output  Evaluate adherence to medications and priority barriers to resolve; 3/3: Patient taking Augmentin        Observe for trends in symptoms and  measures, provide direction to patient, and notify provider as needed       Discuss and provide resources for ACP; 3/3: Not on file. Will address.

## 2020-07-16 NOTE — Telephone Encounter (Signed)
Neck issues usually do not cause dizziness or passing out.  She was last seen in Sept and would need to be evaluated so have her schedule an appt.  Give her the number to reschedule the bone dexa scan.

## 2020-07-17 NOTE — Progress Notes (Signed)
Review of chart indicates prescription for Percocet was e-scribed.

## 2020-07-21 ENCOUNTER — Telehealth

## 2020-07-21 ENCOUNTER — Encounter

## 2020-07-21 MED ORDER — AMOXICILLIN CLAVULANATE 875 MG-125 MG TAB
875-125 mg | ORAL_TABLET | Freq: Two times a day (BID) | ORAL | 0 refills | Status: AC
Start: 2020-07-21 — End: 2020-07-31

## 2020-07-21 NOTE — Telephone Encounter (Signed)
Abx sent to pharmacy.  Please instruct patient on dakin's soaks.  1 teaspoon of bleached mixed with 2 cups of warm water twice daily.

## 2020-07-21 NOTE — Telephone Encounter (Signed)
 Patient called in stating she woke up this morning to find her fingers with some heat to it along with a pus spot and she is requesting another round of antibiotics or if she needs to come in    Patient's contact is [phone]

## 2020-07-22 MED ORDER — OXYCODONE-ACETAMINOPHEN 5 MG-325 MG TAB
5-325 mg | ORAL_TABLET | ORAL | 0 refills | Status: DC
Start: 2020-07-22 — End: 2020-07-24

## 2020-07-22 NOTE — Telephone Encounter (Signed)
Left generic voicemail. If patient calls back, please inform of Dr Tawana Scale message.

## 2020-07-23 NOTE — Progress Notes (Signed)
Care Transitions Follow Up Call    Challenges to be reviewed by the provider   Additional needs identified to be addressed with provider: no  none           Method of communication with provider : none    Care Transition Nurse (CTN) contacted the patient by telephone to follow up after admission on 07/05/20. No answer. Left HIPPA compliant message. Name, role and contact information provided. Requested return call.  No PHI on file. Will attempt to contact at a later time.      CTN contacted [facility] on [address] to verify receipt of Augmentin and Percocet. Spoke with Lars Pinks tech. Provided 2 patient identifiers. Verified patient picked up prescriptions for Augmentin and Percocet on 07/22/20.

## 2020-07-24 ENCOUNTER — Inpatient Hospital Stay: Admit: 2020-07-24 | Payer: MEDICARE | Primary: Family Medicine

## 2020-07-24 ENCOUNTER — Encounter

## 2020-07-24 DIAGNOSIS — M79642 Pain in left hand: Secondary | ICD-10-CM

## 2020-07-24 MED ORDER — OXYCODONE-ACETAMINOPHEN 5 MG-325 MG TAB
5-325 mg | ORAL_TABLET | Freq: Three times a day (TID) | ORAL | 0 refills | Status: DC | PRN
Start: 2020-07-24 — End: 2020-08-06

## 2020-07-24 NOTE — Progress Notes (Signed)
Progress Notes by Peggyann Shoals, DO at 07/24/20 1428                Author: Peggyann Shoals, DO  Service: --  Author Type: Physician       Filed: 07/24/20 1507  Encounter Date: 07/24/2020  Status: Signed          Editor: Spero Geralds A, DO (Physician)               10 minutes 2-3 times daily

## 2020-07-24 NOTE — Progress Notes (Signed)
Progress Notes by Fara Boros, RN at 07/24/20 1428                Author: Fara Boros, RN  Service: --  Author Type: Registered Nurse       Filed: 07/24/20 1553  Encounter Date: 07/24/2020  Status: Signed          Editor: Fara Boros, RN (Registered Nurse)               CTN advised patient per Dr. Andrey Campanile (ortho) to soak right middle in Dakin's solution for 10 mins at least 2 times twice a day but up to 3 times a day. Patient verbalized understanding. Notified patient  prescription for Percocet was called into pharmacy. Informed patient Dr. Andrey Campanile would like her to transition to OTC pain medication once she has completed current prescription for Percocet. Patient voiced understanding.

## 2020-07-24 NOTE — Progress Notes (Signed)
Progress  Notes by Aleen Campi, OT at 07/24/20 1015                Author: Aleen Campi, OT  Service: Occupational Therapy  Author Type: Occupational Therapist       Filed: 07/24/20 1235  Date of Service: 07/24/20 1015  Status: Signed          Editor: Aleen Campi, OT (Occupational Therapist)               Hand Therapy Evaluation and Daily Note      Patient Name: Mary [name]   Date:07/24/2020   DOB: [birth year]   Age: 55 y.o.y/o   [x]   Patient DOB Verified   [place]or[place] MED[place] / Plan: [place] MEDICARE PART A & B / Product Type: Medicare /     Referring Provider:  Peggyann Shoals, DO   Next MD Visit:  None scheduled   Onset Date:  February 2022   Surgical Date: 07/05/2020   Surgical Procedure: right middle finger I&D of the dorsum of the finger as well as the flexor tendon sheath      In time:10:15 AM  Out time:11:00 AM   Total Treatment Time (min): 45   Total Timed Codes (min): 30   1:1 Treatment Time ([facility] only): 45    Visit #: 1 of 8      Treatment Area: Pain in left hand [M79.642]   Other specified soft tissue disorders [M79.89]   Other specified postprocedural states [Z98.890]      Precautions:     Hand Dominance: right handed    Hand Involved:  right      Total Evaluation Time:  15      History of Present Condition:   Patient is a right hand dominant 55 y.o. female with a chief complaint  of right hand pain s/p right middle finger I&D of the dorsum of the finger  as well as the flexor tendon sheath on 07/05/2020.        Pain Rating:    Current: (0-no pain 10-debilitating pain) moderate 5/10    At best: (0-no pain 10-debilitating pain) mild 4/10   At worst: (0-no pain 10-debilitating pain) severe 8/10   Location: right hand   Type:  moderate    Better with:    Worse with:       Medications/Allergies/Past Medical History:  See chart;  reviewed with patient. Heart disease, fibromyalgia, depression, osteoporosis, diabetes, arthritis, HTN, visual impairment, hearing impaired, tobacco use, asthma, scoliosis,  COPD, h/o stroke, bipolar disorder      Diagnostic Tests: none      Prior Level of Function: (I) with ADL/IADL tasks without fucntional limitations  and pain using dominant right hand      Current Level of Function:  (I) with functional limitations  and pain      Social History: Pt lives with god daughter in home      Occupation/Job Requirements : disability      Observation: scabbing of dorsum middle phalanx, scabbing over volar palm and  distal phalanx volar aspect   Scar/incision:   healing   Location:  Right hand       Palpation:  na      Range of Motion:    Single Digit ROM CHART as measured in degrees        Digit   A/P  07/24/2020   Right MF  07/24/2020    Right IF  MP  0-66  70       PIP  20-90  104       DIP  0-42  38            TAM  178  212             Strength:  nt         Sensation:    Hypersensitive to touch      Edema: Right MF circum P1 5.8 cm, PIP jt 6.9, P2 5.8 cm, DIP 5.0, P3 4.8 cm      Special Tests: n/a      ADLs   Feeding:        []  MaxA   [] ModA    [] MinA    []  CGA    [] SBA    [] ModI    [x] Independent   UE Dressing:       [] MaxA   [] ModA   []  MinA   []  CGA   [] SBA   []  ModI   [x] Independent   LE Dressing:       [] MaxA   [] ModA   []  MinA   []  CGA   [] SBA   []  ModI   [x] Independent   Grooming:       [] MaxA   [] ModA   []  MinA   []  CGA   [] SBA   []  ModI   [x] Independent   Toileting:       [] MaxA   [] ModA   []  MinA   []  CGA   [] SBA   []  ModI   [x] Independent   Bathing:       [] MaxA   [] ModA   []  MinA   []  CGA   [] SBA   []  ModI   [x] Independent   Light Meal Prep:    [] MaxA   [] ModA   []  MinA   []  CGA   [] SBA   []  ModI   [x] Independent   Household/Other: [] MaxA   [] ModA   []  MinA   []  CGA   [] SBA   []  ModI   [x] Independent   Adaptive Equip:     [] MaxA   [] ModA   []  MinA   []  CGA   [] SBA   []  ModI   [] Independent   Driving:       [] MaxA   [] ModA   []  MinA   []  CGA   [] SBA   []  ModI   [x] Independent         Todays Treatment:  Patient received an initial evaluation today followed  by education  as to diagnosis, precautions and treatment plan.  Patient was provided with a basic home exercise program including tendon glides and digit blocking exercises. Pt was also provided and instructed in use of desensitization kit to reduce hypersensitivity  of right hand middle digit to light touch.        OBJECTIVE      Modality rationale:  decrease pain and increase tissue extensibility to improve the patients ability to functionally  use right hand         Min  Type  Additional Details           []  Estim:   [] Unatt       [] IFC  []  Premod                         []  Other:   [] w/ice    [] w/heat   Position:   Location:           []   Estim: [] Att    []  TENS instruct  [] NMES                     []  Other:   [] w/US    [] w/ice   [] w/heat   Position:   Location:           []   Traction: []  Cervical       []  Lumbar                        []  Prone          [] Supine                        [] Intermittent   [] Continuous  Lbs:   []   before manual   []   after manual           []   Ultrasound: [] Continuous   []   Pulsed                            []   []  W/cm2:   Location:       []   Iontophoresis  with dexamethasone          Location:  []   Take home patch    []   In clinic         5 concurrent with self care  []   Ice      [x]   Heat MHP   []   Ice massage   []   Laser    []   Paraffin  Position: resting   Location: right hand           []   Laser  with stim   []   Other:   Position:   Location:           []   Vasopneumatic  Device  Pressure:       []   lo []  med []  hi    Temperature: []  lo []  med []   hi           [x]  Skin assessment post-treatment:  [x] intact [x]  redness- no adverse reaction          10  min  Therapeutic Exercise:  [x]   See flow sheet :     Rationale: increase ROM  to improve the patients ability to make a composite fist with right hand.           20  min  Self Care/Home Management: prognosis, diagnosis, treatment plan, activity modifications, desensitization program, scar mgmt     Rationale:  education   to improve the  patients ability to promote healing of surgical site and improve functional use of dominant right hand.          With    []   TE    []  TA    []  neuro    []  other:  Patient Education: [x]   Review HEP     []  Progressed/Changed HEP based on:    []  positioning   []  body mechanics   []   transfers   []  heat/ice application    []  Splint wear/care   []  Sensory re-education   []   scar management       []  other:         Pain Level (0-10 scale) post treatment: 5/10      Patient will continue to benefit from skilled OT services to modify and  progress therapeutic interventions, address ROM deficits, address strength  deficits, analyze and address soft tissue restrictions and instruct in home and community integration to attain goals.      Assessment: Pt presents to skilled OT today rating her pain level 5/10 in the right hand that is worse with use. There is scabbing and redness noted over the dorsum middle phalanx and scabbing over  the palm and volar distal phalanx with no drainage noted, intact, and healing. Pt reports improved redness since beginning another round of abx recently.  Her right long finger is limited to 178 deg TAM with a 20 deg extension lag and her right index  finger is limited to 212 deg TAM.  There is hypsersentivitity to touch noted over the distal aspect of the long finger and mild edema noted about the digit.  She reports (I) with her ADL/IADL tasks but with functional limitations and pain using her dominant  right hand.         Evaluation Complexity: History LOW Complexity : Brief history review   Examination MEDIUM Complexity : 3-5 performance deficits relating to physical, cognitive  , or psychosocial skils that result in activity limitations and / or participation restrictions Clinical Decision Making  MEDIUM Complexity : Patient may present with comorbidities that affect occupational performnce. Miniml to moderate modification of tasks or assistance (eg, physical or verbal ) with assesment(s)  is  necessary to enable patient to complete evaluation    Overall Complexity Rating: MEDIUM      Patient would benefit from OT/Hand therapy services for the following problems:   Problem List: Pain effecting function, Decreased range of motion, Decreased strength,  Edema effecting function, Decreased coordination/prehension, Decreased ADL/functional abilities , Decreased activity tolerance, Decreased flexibility/joint mobility, Decreased transfer abilities and Sensability       Treatment Plan may include any combination of the following: Therapeutic exercise, Therapeutic activities, Neuromuscular re-education, Physical  agent/modality, Scar management, Manual therapy, Wound care, Patient education and ADLs/IADLs      Patient / Family readiness to learn indicated by: asking questions, trying to perform skills and interest      Persons(s) to be included in education:   patient (P)      Barriers to Learning/Limitations: yes;  emotional      Patient Goal (s): use of my right finger and right hand      Patient Self Reported Health Status: fair      Rehabilitation Potential: good      Short Term Goals: To be accomplished in 2  weeks:   Goal:* Patient will be compliant with initial home exercise program to take an active role in their rehabilitation process.   Status at Eval: Patient was provided with a basic home exercise program including tendon glides and digit blocking exercises.       Goal:* Patient will demonstrate a good understanding of their condition and strategies for self-management.   Status at Eval: pt educated on prognosis, diagnosis, treatment plan, activity modifications, desensitization program, scar mgmt      Long Term Goals: To be accomplished in 4 weeks :    Goal:*Patient will regain 220 degrees total arc of motion of the right index finger to enable grasp of cylindrical objects such as a glass, handle or toothbrush.   Status at Eval: 212 deg TAM      Goal:*Patient will regain 220 degrees total arc of  motion of the right long finger to enable grasp of cylindrical objects such as a glass, handle  or toothbrush.   Status at Eval: 178 deg      Goal:*Patient will attain 5 degrees or less of right long finger digital extension to enable her to reach into pocket.   Status at Eval: 20 deg extension lag      Goal:* Pt will demonstrate 0.3 cm decrease is right long finger PIP jt in order to improve ability to make a composite fist using right hand.   Status at eval: Right long finger PIP jt circum 6.9 cm       Goal:* Patient will show a 15 point improvement on FOTO functional status measure to improve overall functional performance.   Status at Eval: 51      Frequency / Duration: Patient to be seen 2 times per week for 4 weeks :      Patient/ Caregiver education and instruction: Diagnosis, prognosis, self care, activity modification and exercises         Certification Period: 07/24/2020 - 08/22/2020      Aleen Campi, OT, 07/24/2020 10:22  AM

## 2020-07-24 NOTE — Telephone Encounter (Signed)
Sent.    Please inform patient this will be her last prescription pain medicine.  She should switch to OTC meds.

## 2020-07-24 NOTE — Progress Notes (Signed)
Progress Notes by Fara Boros, RN at 07/24/20 1428                Author: Fara Boros, RN  Service: --  Author Type: Registered Nurse       Filed: 07/24/20 1456  Encounter Date: 07/24/2020  Status: Signed          Editor: Fara Boros, RN (Registered Nurse)               Follow Up Call        Challenges to be reviewed by the provider        Additional needs identified to be addressed with provider: yes   CTN provided patient instructions on how to mix Dakin's Solution. Need info regarding how long patient is to soak finger? Please contact patient with this info.                  Encounter was routed to provider for escalation. Method of communication with provider: chart routing.      Contacted the patient by telephone to follow up after hospital visit.      Status: worsened   Interventions to address identified needs: Assessed patient for s/s of infection. Pateint voiced she felt "feverish" the other day but is fine today. Patient reported there is a blister at the top of finger where the sutures were removed.  Patient voiced PT looked at it today and they plan to keep an eye on it as ortho is booked up. CTN provided patient instructions on how to mix Dakin's solution (1 teaspoon of bleach mixed with 2 cups of warm water). CTN advised patient to perform soaks  2 times a ay. CTN contacted ortho office with patient on a 3-way call. Spoke with [place]. Provided 2 patient identifiers. CTN requested office contact patient with instructions on how long she is to soak finger. CTN advised patient to contact office if  they do not return call within the hour. Patient voiced understanding.       [facility] follow up appointment(s):      Future Appointments           Date  Time  Provider  Department  Center           07/28/2020  11:30 AM  Aleen Campi, OT  [facility]     07/30/2020   1:45 PM  Normajean Baxter, OTA  [facility]     08/04/2020   9:15 AM  Normajean Baxter, OTA  [facility]      08/06/2020  10:00 AM  Normajean Baxter, OTA  [facility]     08/11/2020  12:15 PM  Aleen Campi, OT  [facility]     08/13/2020  10:45 AM  Normajean Baxter, OTA  [facility]           08/18/2020  12:15 PM  Aleen Campi, OT  [facility]        Non-[facility] follow up appointment(s): none    Follow up appointment completed? yes.       Provided contact information for future needs. Plan for follow-up call in 7-10 days based on severity of symptoms and risk factors.   Plan for next call: symptom management-Assess for infection, self management-adherence to Dakin's soaks twice daily and medication management-completion of Augmentin       Fara Boros, RN

## 2020-07-24 NOTE — Telephone Encounter (Signed)
Victorino Dike (RN transition nurse) called to inquire how long does patient soak. States she received instructions but it does not specify how long she is to soak.     Phn #: [phone]

## 2020-07-24 NOTE — Telephone Encounter (Signed)
Patient is requesting a refill of Oxycodone. Patient uses [facility] on [address].    Patient can be reached at [phone]

## 2020-07-24 NOTE — Telephone Encounter (Signed)
I sent her a connect care message directly stating 10 mins 2-3 times a day.

## 2020-07-24 NOTE — Progress Notes (Signed)
Progress  Notes by Odetta Pink, OT at 07/24/20 1015                Author: Odetta Pink, OT  Service: Occupational Therapy  Author Type: Occupational Therapist       Filed: 07/24/20 1236  Date of Service: 07/24/20 1015  Status: Signed           Editor: Odetta Pink, Albert (Occupational Therapist)  Cosigner: Lissa Hoard A, DO at 07/24/20 1300               [facility] - [facility]   [address] [place] [address]   [phone]  [phone] fax      Plan of Care/Statement of Necessity for Occupational Therapy Services         Patient name: Mary [name]  Start of Care: 07/24/2020        Referral source: Lissa Hoard A, DO  DOB: 1965-07-29      Medical Diagnosis: Pain in left hand [M79.642]   Other specified soft tissue disorders [M79.89]   Other specified postprocedural states [Z98.890]   Payor: [place] MED[place]ARE / Plan: [place] MEDICARE PART A & B / Product Type: Medicare /   Onset Date:February 2022        Treatment Diagnosis: right hand pain        Prior Hospitalization: see medical history  Provider#: [id number]       Medications: Verified on Patient summary List      Comorbidities: Heart disease, fibromyalgia, depression, osteoporosis, diabetes, arthritis, HTN, visual impairment, hearing  impaired, tobacco use, asthma, scoliosis, COPD, h/o stroke, bipolar disorder    Prior Level of Function: (I) with ADL/IADL tasks without fucntional limitations and  pain using dominant right hand            The Plan of Care and following information is based on the information from the initial evaluation.   Assessment/ key information: Patient is a right hand dominant  56 y.o. female with a chief complaint  of right hand pain s/p right middle finger I&D of the dorsum of the finger as well as the flexor tendon sheath  on 07/05/2020.  Pt presents to skilled OT today rating her pain level 5/10 in the right hand that is worse with use. There is scabbing and redness noted over the dorsum  middle phalanx and scabbing  over the palm and volar distal phalanx with no drainage noted, intact, and healing. Pt reports improved redness since beginning another round of abx recently.  Her right long finger is limited to 178 deg TAM with a 20 deg extension lag and her right index  finger is limited to 212 deg TAM.  There is hypsersentivitity to touch noted over the distal aspect of the long finger and mild edema noted about the digit.  She reports (I) with her ADL/IADL tasks but with functional limitations and pain using her dominant  right hand. Patient received an initial evaluation today followed by education as to diagnosis, precautions and treatment plan.  Patient was provided  with a basic home exercise program including tendon glides and digit blocking exercises. Pt was also provided and instructed in use of desensitization kit to reduce hypersensitivity of right hand middle digit to light touch .  Skilled OT services are necessary to address deficits and improve quality of life  using dominant right hand.          Evaluation Complexity: History  LOW Complexity : Brief history review  Examination  MEDIUM Complexity : 3-5 performance deficits relating to physical, cognitive , or psychosocial skils that result in activity limitations and / or participation restrictions Clinical Decision  Making MEDIUM Complexity : Patient may present with comorbidities that affect occupational performnce. Miniml to moderate modification of tasks or assistance (eg, physical or verbal ) with assesment(s)  is necessary to enable patient to complete evaluation    Overall Complexity Rating: MEDIUM      Patient would benefit from OT/Hand therapy services for the following problems:   Problem List: Pain effecting function, Decreased range of motion, Decreased strength, Edema effecting function, Decreased coordination/prehension,  Decreased ADL/functional abilities , Decreased activity tolerance, Decreased flexibility/joint  mobility, Decreased transfer abilities and Sensability       Treatment Plan may include any combination of the following: Therapeutic exercise, Therapeutic activities, Neuromuscular re-education, Physical agent/modality, Scar management, Manual therapy, Wound  care, Patient education and ADLs/IADLs      Patient / Family readiness to learn indicated by: asking questions, trying to perform skills and interest      Persons(s) to be included in education:   patient (P)      Barriers to Learning/Limitations: yes;  emotional      Patient Goal (s): use of  my right finger and right hand      Patient Self Reported Health Status: fair      Rehabilitation Potential: good      Short Term Goals: To be accomplished in 2  weeks:   Goal:* Patient will be compliant with initial home exercise program to take an active role in their rehabilitation process.   Status at Eval: Patient was provided with a basic home exercise program including tendon glides and digit blocking exercises.       Goal:* Patient will demonstrate a good understanding of their condition and strategies for self-management.   Status at Eval: pt educated on prognosis, diagnosis, treatment plan, activity modifications, desensitization program, scar mgmt      Long Term Goals: To be accomplished in 4 weeks:    Goal:*Patient will regain 220 degrees total arc of motion of the right index finger to enable grasp of cylindrical objects such as a glass, handle or toothbrush.   Status at Eval: 212 deg TAM      Goal:*Patient will regain 220 degrees total arc of motion of the right long finger to enable grasp of cylindrical objects such as a glass, handle or toothbrush.   Status at Eval: 178 deg      Goal:*Patient will attain 5 degrees or less of right long finger digital extension to enable her to reach into pocket.   Status at Eval: 20 deg extension lag      Goal:* Pt will demonstrate 0.3 cm decrease is right long finger PIP jt in order to improve ability to make a composite  fist using right hand.   Status at eval: Right long finger PIP jt circum 6.9 cm       Goal:* Patient will show a 15 point improvement on FOTO functional status measure to improve overall functional performance.   Status at Eval: 37      Frequency / Duration: Patient to be seen 2 times per week for 4 weeks:      Patient/ Caregiver education and instruction: Diagnosis, prognosis, self care, activity modification and exercises         [x]   Plan of care has been reviewed with [facility]  Certification Period: 07/24/2020 - 08/22/2020      Odetta Pink, OT 07/24/2020 12:35  PM      ________________________________________________________________________      I certify that the above Therapy Services are being furnished while the patient is under my care. I agree with the treatment plan and certify that this therapy is necessary.      Physician's Signature:____________Date :_________TIME:________      Birdena Jubilee, DO   ** Signature, Date and Time must be completed for valid certification **      Please sign and return to [facility]   [address] [place] [address]   [phone]  [phone] fax

## 2020-07-27 NOTE — Progress Notes (Signed)
Progress Notes by Fara Boros, RN at 07/27/20 0981                Author: Fara Boros, RN  Service: --  Author Type: Registered Nurse       Filed: 07/27/20 1119  Encounter Date: 07/27/2020  Status: Signed          Editor: Fara Boros, RN (Registered Nurse)               CTN returned missed call over the weekend. No answer. Left HIPPA compliant message. Name, role and contact information provided. Requested return call.  Will attempt to contact at a later time.

## 2020-07-28 ENCOUNTER — Encounter: Payer: MEDICARE | Primary: Family Medicine

## 2020-07-29 NOTE — Progress Notes (Signed)
Progress Notes by Peggyann Shoals, DO at 07/29/20 1013                Author: Peggyann Shoals, DO  Service: --  Author Type: Physician       Filed: 07/29/20 1318  Encounter Date: 07/29/2020  Status: Signed          Editor: Spero Geralds A, DO (Physician)               If it is better, she can stop the soaks

## 2020-07-29 NOTE — Progress Notes (Signed)
Progress Notes by Antoine Primas, RN at 07/29/20 1013                Author: Antoine Primas, RN  Service: --  Author Type: Registered Nurse       Filed: 07/29/20 2231  Encounter Date: 07/29/2020  Status: Signed          Editor: Antoine Primas, RN (Registered Nurse)               Care Transitions Follow Up Call        Challenges to be reviewed by the provider        Additional needs identified to be addressed with provider: yes       Pt. States that her finger is better. No more puss and fever per Pt.  Pt. Reports that she stopped soaking her finger for two days  now because she is afraid for the scab (on top on finger from sutures removal per Pt.)  to come off and the idea of putting bleach on an open skin. Does she needs to continue to soak?                   Method of communication with provider : chart routing         Care Transition Nurse (CTN) contacted the patient by telephone to follow up after admission on 07/05/20 Verified name and DOB with patient as identifiers.      Patient states that she is doing okay. Pt. States that her finger is better. No more puss and fever per Pt.  Pt. Reports that she stopped  soaking her finger for two days now because she is afraid for the scab (on top on finger from sutures removal per Pt.)  to come off and the idea of putting bleach on an open skin. Does she needs to continue to soak?   Message chart route to  Pt. Surgeon.       Connection got disconnected. CTN called Patient x2 , left a vm with office contact information. No patient medical information left on message.       Reminded Pt. to go to the nearest emergency room for chest pain, shortness of breath, returning of symptoms that brought her to the emergency  room and/or worsening of symptoms. Pt. Verbalized and repeated back understanding.Pt. states that she knows when to go back to the hospital.       [facility] follow up appointment(s):      Future Appointments           Date  Time  Provider   Department  Center           07/30/2020   1:45 PM  Normajean Baxter, OTA  [facility]     08/04/2020   9:15 AM  Normajean Baxter, OTA  [facility]     08/06/2020  10:00 AM  Normajean Baxter, OTA  [facility]     08/11/2020  12:15 PM  Aleen Campi, OT  [facility]     08/13/2020  10:45 AM  Normajean Baxter, OTA  [facility]           08/18/2020  12:15 PM  Aleen Campi, OT  [facility]

## 2020-07-30 ENCOUNTER — Inpatient Hospital Stay: Payer: MEDICARE | Primary: Family Medicine

## 2020-07-30 NOTE — Progress Notes (Signed)
Progress Notes by Antoine Primas, RN at 07/30/20 1334                Author: Antoine Primas, RN  Service: --  Author Type: Registered Nurse       Filed: 07/30/20 1658  Encounter Date: 07/30/2020  Status: Signed          Editor: Antoine Primas, RN (Registered Nurse)               Care Transitions Follow Up Call        Challenges to be reviewed by the provider        Additional needs identified to be addressed with provider: no   none                 Method of communication with provider : none      Care Transition Nurse (CTN) contacted the patient by telephone to follow up after admission  on 07/05/20. Verified name and DOB with patient as identifiers.      Patient reports that she is not feeling well at this time and she doesn't feel like talking.    CTN informed Patient that if she needs anything or has concerns to give CTN a call back.   Pt. States that she will do so and ended the call.       [facility] follow up appointment(s):      Future Appointments           Date  Time  Provider  Department  Center           08/04/2020   9:15 AM  Normajean Baxter, OTA  [facility]     08/06/2020  10:00 AM  Normajean Baxter, OTA  [facility]     08/11/2020  12:15 PM  Aleen Campi, OT  [facility]     08/13/2020  10:45 AM  Normajean Baxter, OTA  [facility]           08/18/2020  12:15 PM  Aleen Campi, OT  [facility]

## 2020-07-31 NOTE — Progress Notes (Signed)
 Care Transitions Follow Up Call    Care Transition Nurse (CTN) contacted the patient by telephone to follow up after admission on 07/05/20. Verified name and DOB with patient as identifiers.    Patient reports that she doesn't feel good. CTN offered support and assistance, Pt. Declined.   CTN relayed Dr. Tanda message  If it is better, she can stop the soaks. to patient. Pt. Verbalized understanding. Pt. Thanked us  for this.     Unable to ask further questions as patient kept the conversation short and ended the call.        [facility] follow up appointment(s):   Future Appointments   Date Time Provider Department Center   08/04/2020  9:15 AM Burnette Clapper, OTA [facility]   08/06/2020 10:00 AM Burnette Clapper, OTA [facility]   08/11/2020 12:15 PM Millicent Moats, OT [facility]   08/13/2020 10:45 AM Burnette Clapper, OTA [facility]   08/18/2020 12:15 PM Millicent Moats, OT [facility]

## 2020-08-03 ENCOUNTER — Encounter: Payer: MEDICARE | Primary: Family Medicine

## 2020-08-04 ENCOUNTER — Inpatient Hospital Stay: Admit: 2020-08-04 | Payer: MEDICARE | Primary: Family Medicine

## 2020-08-04 NOTE — Progress Notes (Signed)
 OT DAILY TREATMENT NOTE 06-21    Patient Name: Mary [name]  Date:08/04/2020  DOB: 1966-01-08  [x]   Patient DOB Verified  [place]or: [place] MED[place] / Plan: [place] MED[place]ARE PART [place] & B / Product Type: Medicare /    In time: 9:18 Out time:10:00  Total Treatment Time (min): 42  Visit #: 2 of 8    Medicare/BCBS Only   Total Timed Codes (min):  42 1:1 Treatment Time:  42     Treatment Area: Pain in left hand [M79.642]  Other specified soft tissue disorders [M79.89]  Other specified postprocedural states [Z98.890]    SUBJECTIVE  Pain Level (0-10 scale): 5/10  Any medication changes, allergies to medications, adverse drug reactions, diagnosis change, or new procedure performed?: [x]  No    []  Yes (see summary sheet for update)  Subjective functional status/changes:   []  No changes reported    Not done the soak yet   I am getting more feeling in my fingertip   I have been doing heat at home     OBJECTIVE    Modality rationale: decrease edema, decrease inflammation, decrease pain and increase tissue extensibility to improve the patient's ability to make a fist and grip for functional daily tasks.    Min Type Additional Details    []  Estim:  [] Unatt       [] IFC  [] Premod                        [] Other:  [] w/ice   [] w/heat  Position:  Location:    []  Estim: [] Att    [] TENS instruct  [] NMES                    [] Other:  [] w/US    [] w/ice   [] w/heat  Position:  Location:    []   Traction: []  Cervical       [] Lumbar                       []  Prone          [] Supine                       [] Intermittent   [] Continuous Lbs:  []  before manual  []  after manual    []   Ultrasound: [] Continuous   []  Pulsed                           []   [] W/cm2:  Location:    []   Iontophoresis with dexamethasone         Location: []  Take home patch   []  In clinic   8 with selfcare []   Ice     [x]   Heat- MHP   []   Ice massage  []   Laser   []   Paraffin Position: seated/resting  Location: right hand     []   Laser with stim  []   Other:  Position:  Location:     []   Vasopneumatic Device    []   Right     []   Left  Pre-treatment girth:  Post-treatment girth:  Measured at (location):  Pressure:       []  lo []  med []  hi   Temperature: []  lo []  med []  hi     [x]  Skin assessment post-treatment:  [x] intact [x] redness- no adverse reaction    [] redness - adverse reaction:  17 min Therapeutic Exercise:  []  See flow sheet :   Rationale: increase ROM to improve the patient's ability to make a composite fist and grip.      Right hand     Digit blocking MF   Tendon glides   Towel scrunches   Abd/add with foam pieces     15 min Therapeutic Activity:  []   See flow sheet :   Rationale: increase ROM and improve coordination  to improve the patient's ability to manipulate small items.     Right hand:    9-hole peg board - untimed   Opposition with marbles - MF and thumb only   Dexterity balls        10 min Self Care/Home Management: scar management , edema management , heat modalities    Rationale: patient education   to improve the patient's ability to self-manage symptoms.     Heat modalities to reduce stiffness  Scar management / healing strategies   Edema management -elevation and edema massage     With   []  TE   []  TA   []  neuro   []  other: Patient Education: [x]  Review HEP    [x]  Progressed/Changed HEP based on: min cues for proper positioning with tendon glides and blocking exercises.     []  positioning   []  body mechanics   []  transfers   []  heat/ice application   []  Splint wear/care   []  Sensory re-education   []  scar management      []  other:            Other Objective/Functional Measures:     Frequent rest breaks due to increased discomfort with digit ROM.    Status at eval: Right long finger PIP jt circum 6.9 cm   08/04/20: 6.5cm PIP jt circum right long finger     Pain Level (0-10 scale) post treatment: 6.5/10    ASSESSMENT/Changes in Function: incision site on distal aspect of MF still in healing process, Pt showed OTR incision site and was educated on management strategies,  Increased pain levels with constant long finger ROM for clinic tasks, decreased edema in long finger PIP jt.    Patient will continue to benefit from skilled OT services to address ROM deficits, address strength deficits, analyze and address soft tissue restrictions and analyze and modify body mechanics/ergonomics to attain remaining goals.     [x]   See Plan of Care  []   See progress note/recertification  []   See Discharge Summary         Progress towards goals / Updated goals:    Short Term Goals: To be accomplished in 2  weeks:  Goal:* Patient will be compliant with initial home exercise program to take an active role in their rehabilitation process.  Status at Eval: Patient was provided with a basic home exercise program including tendon glides and digit blocking exercises.   08/04/20: min cues for proper positioning with tendon glides and blocking exercises.     Goal:* Patient will demonstrate a good understanding of their condition and strategies for self-management.  Status at Eval: pt educated on prognosis, diagnosis, treatment plan, activity modifications, desensitization program, scar mgmt  08/04/20: reviewed scar management and edema management strategies.     Long Term Goals: To be accomplished in 4 weeks:              Goal:*Patient will regain 220 degrees total arc of motion of the right index finger to enable grasp of cylindrical objects  such as a glass, handle or toothbrush.  Status at Eval: 212 deg TAM    Goal:*Patient will regain 220 degrees total arc of motion of the right long finger to enable grasp of cylindrical objects such as a glass, handle or toothbrush.  Status at Eval: 178 deg    Goal:*Patient will attain 5 degrees or less of right long finger digital extension to enable her to reach into pocket.  Status at Eval: 20 deg extension lag    Goal:* Pt will demonstrate 0.3 cm decrease is right long finger PIP jt in order to improve ability to make a composite fist using right hand.  Status at  eval: Right long finger PIP jt circum 6.9 cm   08/04/20: 6.5cm PIP jt circum right long finger     Goal:* Patient will show a 15 point improvement on FOTO functional status measure to improve overall functional performance.  Status at Eval: 66    PLAN  []   Upgrade activities as tolerated     [x]   Continue plan of care  []   Update interventions per flow sheet       []   Discharge due to:_  []   Other:_      Abigail Carol, OTA 08/04/2020  9:00 AM    Future Appointments   Date Time Provider Department Center   08/04/2020  9:15 AM Carol Abigail, OTA [facility]   08/06/2020 10:00 AM Carol Abigail, OTA [facility]   08/11/2020 12:15 PM Millicent Moats, OT [facility]   08/13/2020 10:45 AM Carol Abigail, OTA [facility]   08/18/2020 12:15 PM Millicent Moats, OT [facility]

## 2020-08-05 NOTE — Progress Notes (Signed)
Care Transitions Follow Up Call    Challenges to be reviewed by the provider   Additional needs identified to be addressed with provider: no  none           Method of communication with provider : none    Care Transition Nurse (CTN) contacted the patient by telephone to follow up after admission on 07/05/20. No answer. Left HIPPA compliant message. Name, role and contact information provided. Requested return call.  Will attempt to contact at a later time.

## 2020-08-06 ENCOUNTER — Inpatient Hospital Stay: Admit: 2020-08-06 | Payer: MEDICARE | Primary: Family Medicine

## 2020-08-06 ENCOUNTER — Encounter

## 2020-08-06 MED ORDER — OXYCODONE-ACETAMINOPHEN 5 MG-325 MG TAB
5-325 mg | ORAL_TABLET | Freq: Three times a day (TID) | ORAL | 0 refills | Status: DC | PRN
Start: 2020-08-06 — End: 2020-08-10

## 2020-08-06 NOTE — Progress Notes (Signed)
 OT DAILY TREATMENT NOTE 06-21    Patient Name: Mary [name]  Date:08/06/2020  DOB: 1966-03-03  [x]   Patient DOB Verified  Payor: [place] MED[place]ARE / Plan: [place] MEDICARE PART A & B / Product Type: Medicare /    In time: 10:03  Out time: 10:45  Total Treatment Time (min): 42  Visit #: 3 of 8    Medicare/BCBS Only   Total Timed Codes (min):  42 1:1 Treatment Time:  42     Treatment Area: Pain in left hand [M79.642]  Other specified soft tissue disorders [M79.89]  Other specified postprocedural states [Z98.890]    SUBJECTIVE  Pain Level (0-10 scale): 3-4/10  Any medication changes, allergies to medications, adverse drug reactions, diagnosis change, or new procedure performed?: [x]  No    []  Yes (see summary sheet for update)  Subjective functional status/changes:   []  No changes reported    I like the heat  I am not sure what to do about this scar on the tip of my finger, I am not sure I want to do the soak   Try to do the exercises as much as I can      OBJECTIVE    Modality rationale: decrease edema, decrease inflammation, decrease pain and increase tissue extensibility to improve the patient's ability to make a fist and grip for functional daily tasks.    Min Type Additional Details     [] ? Estim:  [] ?Unatt       [] ?IFC  [] ?Premod                        [] ?Other:  [] ?w/ice   [] ?w/heat  Position:  Location:     [] ? Estim: [] ?Att    [] ?TENS instruct  [] ?NMES                    [] ?Other:  [] ?w/US    [] ?w/ice   [] ?w/heat  Position:  Location:     [] ?  Traction: [] ? Cervical       [] ?Lumbar                       [] ? Prone          [] ?Supine                       [] ?Intermittent   [] ?Continuous Lbs:  [] ? before manual  [] ? after manual     [] ?  Ultrasound: [] ?Continuous   [] ? Pulsed                           [] ?   [] ? W/cm2:  Location:     [] ?  Iontophoresis with dexamethasone         Location: [] ? Take home patch   [] ? In clinic    8 with selfcare [] ?  Ice     [x] ?  Heat- MHP   [] ?  Ice massage  [] ?  Laser   [] ?   Paraffin Position: seated/resting  Location: right hand      [] ?  Laser with stim  [] ?  Other:  Position:  Location:     [] ?  Vasopneumatic Device    [] ?  Right     [] ?  Left  Pre-treatment girth:  Post-treatment girth:  Measured at (location):  Pressure:       [] ? lo [] ? med [] ? hi   Temperature: [] ?  lo [] ? med [] ? hi      [x] ? Skin assessment post-treatment:  [x] ?intact [x] ?redness- no adverse reaction    [] ?redness - adverse reaction:     12 min Therapeutic Exercise:  []  See flow sheet :   Rationale: increase ROM to improve the patient's ability to make a composite fist and grip.      right hand/ MF:     Blocking exercises   Tendon glides   Towel scrunches       10 min Therapeutic Activity:  []   See flow sheet :   Rationale: increase ROM and improve coordination  to improve the patient's ability to manipulate small items.     right hand:    Opposition with foam pieces  Abd/add with foam pieces        10 min Manual Therapy:  IASTM #6   The manual therapy interventions were performed at a separate and distinct time from the therapeutic activities interventions.  Rationale: decrease pain, increase ROM, increase tissue extensibility and decrease edema  to move IF and grip.     Edema massage with IASTM #6 on left IF       10 min Self Care/Home Management: scar management , edema management , heat modalities    Rationale: patient education   to improve the patient's ability to self-manage symptoms.     Reviewed use of heat modalities  Edema management -elevation of the left hand/wrist and edema massage       With   []  TE   []  TA   []  neuro   []  other: Patient Education: [x]  Review HEP    [x]  Progressed/Changed HEP based on: min cues for proper positioning with blocking HEP.   []  positioning   []  body mechanics   []  transfers   []  heat/ice application   []  Splint wear/care   []  Sensory re-education   []  scar management      []  other:            Other Objective/Functional Measures:     More difficulty with DIP ROM    Pt able to make a lose composite fist.     Status at eval: Right long finger PIP jt circum 6.9 cm   08/04/20: 6.5cm PIP jt circum right long finger    08/06/20: 6.4cm PIP jt circum right long finger.      Pain Level (0-10 scale) post treatment: 4.5/10    ASSESSMENT/Changes in Function: decreased stiffness with heat and massage modalities, slight decrease in PIP edema, increased pain with constant movement of MF for clinic tasks. Pt benefits from education and review on self-management strategies.     Patient will continue to benefit from skilled OT services to address ROM deficits, address strength deficits, analyze and address soft tissue restrictions and analyze and modify body mechanics/ergonomics to attain remaining goals.     [x]   See Plan of Care  []   See progress note/recertification  []   See Discharge Summary         Progress towards goals / Updated goals:    Short Term Goals: To be accomplished in 2 weeks:  Goal:* Patient will be compliant with initial home exercise program to take an active role in their rehabilitation process.  Status at Eval: Patient was provided with a basic home exercise program including tendon glides and digit blocking exercises.   08/04/20: min cues for proper positioning with tendon glides and blocking exercises.     Goal:* Patient will demonstrate  a good understanding of their condition and strategies for self-management.  Status at Eval: pt educated on prognosis, diagnosis, treatment plan, activity modifications, desensitization program, scar mgmt  08/04/20: reviewed scar management and edema management strategies.     Long Term Goals:To be accomplished in 4 weeks:  Goal:*Patient will regain 220 degrees total arc of motion of the right index finger to enable grasp of cylindrical objects such as a glass, handle or toothbrush.  Status at Eval: 212 deg TAM    Goal:*Patient will regain 220 degrees total arc of motion of the right long finger to enable grasp of  cylindrical objects such as a glass, handle or toothbrush.  Status at Eval: 178 deg    Goal:*Patient will attain 5 degrees or less of right long finger digital extension to enable her to reach into pocket.  Status at Eval: 20 deg extension lag    Goal:* Pt will demonstrate 0.3 cm decrease is right long finger PIP jt in order to improve ability to make a composite fist using right hand.  Status at eval: Right long finger PIP jt circum 6.9 cm   08/04/20: 6.5cm PIP jt circum right long finger    08/06/20: 6.4cm PIP jt circum right long finger.     Goal:* Patient will show a 15 point improvement on FOTO functional status measure to improve overall functional performance.  Status at Eval: 87      PLAN  []   Upgrade activities as tolerated     [x]   Continue plan of care  []   Update interventions per flow sheet       []   Discharge due to:_  []   Other:_      Abigail Carol, OTA 08/06/2020  10:02 AM    Future Appointments   Date Time Provider Department Center   08/11/2020 12:15 PM Millicent Moats, OT [facility]   08/13/2020 10:45 AM Carol Abigail, OTA [facility]   08/18/2020 12:15 PM Millicent Moats, OT [facility]

## 2020-08-06 NOTE — Telephone Encounter (Signed)
OK refill

## 2020-08-06 NOTE — Progress Notes (Signed)
The swelling is likely from the heating pad overuse.  She should ice now to get the swelling down.  In the future, the heating pad is ok briefly for 10-15 mins to help with stiffness before exercises.  Ice would be better for pain and swelling.

## 2020-08-06 NOTE — Progress Notes (Signed)
 Care Transitions Follow Up Call    Challenges to be reviewed by the provider   Additional needs identified to be addressed with provider: yes  Patient reported increased swelling and pain to right middle finger. Denied any drainage. Patient reported she left heating pad in place X 1 hr (fell asleep with heating pad on). Please advise if patient should use heat to finger.            Method of communication with provider : chart routing    Patient returned call to Care Transition Nurse (CTN) for  follow up after admission on 07/05/20. Verified name and DOB with patient as identifiers.    Addressed changes since last contact: none  Follow up appointment completed? yes.   Was follow up appointment scheduled within 7 days of discharge? yes.     Advance Care Planning:   Does patient have an Advance Directive:  not on file    CTN reviewed discharge instructions, medical action plan and red flags with patient and discussed any barriers to care and/or understanding of plan of care after discharge. Discussed appropriate site of care based on symptoms and resources available to patient including: Specialist, When to call 911 and CTN. The patient agrees to contact the PCP office for questions related to their healthcare.     Patients top risk factors for readmission: medical condition-finger pain and swelling    Interventions to address risk factors: Patient reported swelling and pain. Patient attributes increased swelling to weather. Patient verbalized she was unable to complete last 2 exercises during therapy today due to pain and swelling. PAtient reported wound is healing well and there is scab at tip of finger where it was previoulsy open.Patient voiced she applied heating pad to right middle finger and fell asleep. Heating pad was left in place for an hour. Advised patient heat should only be applied for 15 mins but advised heat should not be applied to finger due to swelling. Patient voiced therapist applies heat to  finger prior to therapy. Patient requested pain medication other than Tylenol . Review of chart indicated ortho filled request for pain medication (Perococet). Advised patient Percocet has been called into pharmacy. Patient voiced understanding.     [facility] follow up appointment(s):   Future Appointments   Date Time Provider Department Center   08/11/2020 12:15 PM Millicent Moats, OT [facility]   08/13/2020 10:45 AM Burnette Clapper, OTA [facility]   08/18/2020 12:15 PM Millicent Moats, OT [facility]     Non-[facility] follow up appointment(s): none    CTN provided contact information for future needs. No further follow-up call indicated based on severity of symptoms and risk factors.  Plan for next call: N/A     Goals Addressed                 This Visit's Progress    . Attends follow-up appointments as directed.   On track     Goal: Patient will attend all appointments scheduled within the next 30 days.      Ensure provider appt is scheduled within 7 business days post-discharge  2/24: TOC appt scheduled for 07/10/20 @ 240 PM   Confirm patient attended post-discharge provider appt ; 3/3: Patient attended ortho follow up on 3/2  Complete post-visit call to confirm attendance and update care needs; 3/3: Done. 3/11: Done. 3/23: Attempted. 3/24: Done.         . Prevent complications post hospitalization.        Goal:  No admissions post 30 days from discharge of 07/10/20.      Review/educate common or potential red flags of condition worsening; 3/3: redness, warmth at site(s), swelling, bleeding or pus-like drainage, chills, fever (> 100.5), low body temperature (<97.2), nausea/vomiting that prevents eating/drinking/taking medications, SOB, chest pain/pressure, increased respirations, decreased urine output. 3/24: Patient requested increased swelling.   Evaluate adherence to medications and priority barriers to resolve; 3/3: Patient taking Augmentin.       Observe for trends in symptoms and measures, provide direction to patient,  and notify provider as needed; 3/24: Patient reported increased swelling to operative finger (right middle) after attending therapy. Requesting pain medication stronger than Tylenol .      Discuss and provide resources for ACP; 3/3: Not on file. Will address.

## 2020-08-06 NOTE — Telephone Encounter (Signed)
Patient is requesting a refill of Percocet. Patient uses [facility] on [address].    Patient can be reached at [phone]

## 2020-08-07 ENCOUNTER — Emergency Department: Admit: 2020-08-07 | Payer: MEDICARE | Primary: Family Medicine

## 2020-08-07 ENCOUNTER — Inpatient Hospital Stay
Admit: 2020-08-07 | Discharge: 2020-08-07 | Disposition: A | Discharge status: Home / Self Care | Payer: MEDICARE | Attending: Emergency Medicine

## 2020-08-07 DIAGNOSIS — M47812 Spondylosis without myelopathy or radiculopathy, cervical region: Secondary | ICD-10-CM

## 2020-08-07 MED ORDER — KETOROLAC TROMETHAMINE 15 MG/ML INJECTION
15 mg/mL | INTRAMUSCULAR | Status: DC
Start: 2020-08-07 — End: 2020-08-07
  Administered 2020-08-07: 17:00:00 via INTRAMUSCULAR

## 2020-08-07 MED ORDER — KETOROLAC TROMETHAMINE 15 MG/ML INJECTION
15 mg/mL | INTRAMUSCULAR | Status: AC
Start: 2020-08-07 — End: 2020-08-07
  Administered 2020-08-07: 17:00:00 via INTRAMUSCULAR

## 2020-08-07 MED ORDER — LIDOCAINE 5 % (700 MG/PATCH) ADHESIVE PATCH
5 % | CUTANEOUS | 0 refills | Status: DC
Start: 2020-08-07 — End: 2020-10-01

## 2020-08-07 MED FILL — KETOROLAC TROMETHAMINE 15 MG/ML INJECTION: 15 mg/mL | INTRAMUSCULAR | Qty: 1

## 2020-08-07 NOTE — Telephone Encounter (Signed)
Patient called stating she is having severe pain and inflammation on the right side of her neck, and is going to the emergency room today. She stated she will call again with more information after she is seen.    Patient can be reached at [phone].

## 2020-08-07 NOTE — ED Provider Notes (Signed)
ED Provider Notes by Joaquim Lairacy, Coby Shrewsberry A, PA at 08/07/20 1153                Author: Joaquim Lairacy, Zebbie Ace A, PA  Service: EMERGENCY  Author Type: Physician Assistant       Filed: 08/07/20 1905  Date of Service: 08/07/20 1153  Status: Attested           Editor: Joaquim Lairacy, Dyllan Kats A, PA (Physician Assistant)  Cosigner: Earlie CountsSammond, Andrew, DO at 08/22/20 [id number]          Attestation signed by Earlie CountsSammond, Andrew, DO at 08/22/20 [phone] [id number] (Updated)          I was personally available for consultation in the emergency department.        Earlie CountsAndrew Sammond, DO                                    EMERGENCY DEPARTMENT HISTORY AND PHYSICAL EXAM           Date: 08/07/2020   Patient Name: Mary HausenKaren Cottman        History of Presenting Illness          Chief Complaint       Patient presents with        ?  Neck Pain           History Provided By: Patient      HPI: Mary HausenKaren [name],  55 y.o. female PMHx significant for PTSD, COPD, CHF, aortic valve replacement, bipolar ds,  presents via ambulance to the ED. Pt reports right lateral neck pain x 5 days. Pt reports sx began upon waking  Denies known trauma or injury.  Pt reports multiple previous neck surgeries. Denies fever/chills, trouble swallowing. Pt has been taking percocet  without relief of sx. Pt reports increased pain with neck movement. Denies numbness/tingilng, weakness.       There are no other complaints, changes, or physical findings at this time.      PCP: Caryl AdaBrittman, Stanley, MD        No current facility-administered medications on file prior to encounter.          Current Outpatient Medications on File Prior to Encounter          Medication  Sig  Dispense  Refill           ?  oxyCODONE-acetaminophen (PERCOCET) 5-325 mg per tablet  Take 1 Tablet by mouth every eight (8) hours as needed for Pain for up to 4 days. Max Daily Amount: 3 Tablets.  12 Tablet  0     ?  aspirin 81 mg chewable tablet  Take 81 mg by mouth daily.         ?  furosemide (Lasix) 40 mg tablet  Take 40 mg by mouth daily as  needed (swelling).               ?  pregabalin (LYRICA) 200 mg capsule  Take 1 Capsule by mouth three (3) times daily.  90 Capsule  2           ?  cyclobenzaprine (FLEXERIL) 5 mg tablet  Take 1 Tablet by mouth nightly as needed for Muscle Spasm(s).  30 Tablet  1     ?  ipratropium-albuteroL (Combivent Respimat) 20-100 mcg/actuation inhaler  Take 1 Puff by inhalation every six (6) hours.  4 g  3     ?  fluticasone propionate (FLONASE)  50 mcg/actuation nasal spray  1 spray by nasal route daily  1 Each  2     ?  hydrOXYzine HCL (ATARAX) 25 mg tablet  Take 25 mg by mouth two (2) times daily as needed for Anxiety.         ?  albuterol (PROVENTIL HFA, VENTOLIN HFA, PROAIR HFA) 90 mcg/actuation inhaler  Take 2 Puffs by inhalation every six (6) hours as needed.         ?  cetirizine (ZYRTEC) 10 mg tablet  Take 10 mg by mouth daily.         ?  lamoTRIgine (LaMICtal) 25 mg tablet  75 mg nightly.         ?  metoprolol succinate (TOPROL-XL) 25 mg XL tablet  Take 25 mg by mouth daily.         ?  omeprazole (PRILOSEC) 40 mg capsule  Take 40 mg by mouth daily.         ?  pravastatin (PRAVACHOL) 40 mg tablet  Take 40 mg by mouth nightly.                 ?  warfarin (COUMADIN) 2.5 mg tablet  2.5 mg. Takes  MWF and takes 3.75 mg Tue, Thrs, Sat, Sunday                 Past History        Past Medical History:     Past Medical History:        Diagnosis  Date         ?  Anxiety       ?  Bipolar 1 disorder (HCC)       ?  CHF (congestive heart failure) (HCC)       ?  COPD (chronic obstructive pulmonary disease) (HCC)       ?  COPD (chronic obstructive pulmonary disease) (HCC)       ?  H/O aortic valve replacement           ?  PTSD (post-traumatic stress disorder)             Past Surgical History:     Past Surgical History:         Procedure  Laterality  Date          ?  HAND/FINGER SURGERY UNLISTED  Right            I&D right middle finger          ?  HX HEART CATHETERIZATION         ?  HX HEART VALVE SURGERY              mitral valve  replacement           Family History:     Family History         Problem  Relation  Age of Onset          ?  No Known Problems  Mother             Social History:     Social History          Tobacco Use         ?  Smoking status:  Current Every Day Smoker              Packs/day:  1.00         ?  Smokeless tobacco:  Never Used  Substance Use Topics         ?  Alcohol use:  Never     ?  Drug use:  Yes              Types:  Marijuana           Allergies:     Allergies        Allergen  Reactions         ?  Adhesive  Rash         ?  Lithium  Nausea and Vomiting                Review of Systems     Review of Systems    Constitutional: Negative for chills and fever.    Respiratory: Negative for shortness of breath.     Cardiovascular: Negative for chest pain.    Gastrointestinal: Negative for abdominal pain, nausea and vomiting.    Genitourinary: Negative for flank pain.    Musculoskeletal: Positive for neck pain. Negative for back pain and myalgias.    Skin: Negative for color change, pallor, rash and wound.    Neurological: Negative for dizziness, weakness and light-headedness.    All other systems reviewed and are negative.           Physical Exam     Physical Exam   Vitals and nursing note reviewed.   Constitutional:        General: She is not in acute distress.     Appearance: She is well-developed.      Comments: Pt in NAD   HENT:       Head: Normocephalic and atraumatic.   Eyes :       Conjunctiva/sclera: Conjunctivae normal.   Neck :           Comments: Radial pulses strong and equal b/l  Sensation equal and intact to upper extremities b/l  Strength 5/5 to upper  extremities b/l  No overlying skin changes including erythema or warmth   Cardiovascular:       Rate and Rhythm: Normal rate and regular rhythm.      Heart sounds: Normal heart sounds.    Pulmonary:       Effort: Pulmonary effort is normal. No respiratory distress.      Breath sounds: Normal breath sounds.    Abdominal:      General: Bowel sounds are  normal. There is no distension.      Palpations: Abdomen is soft.     Musculoskeletal:          General: Normal range of motion.      Cervical back: Spinous process tenderness and  muscular tenderness present.    Skin:      General: Skin is warm.      Findings: No rash.   Neurological :       Mental Status: She is alert and oriented to person, place, and time.    Psychiatric:         Behavior: Behavior normal.               Diagnostic Study Results        Labs -    No results found for this or any previous visit (from the past 12 hour(s)).      Radiologic Studies -      CT SPINE CERV WO CONT       Final Result  Status post C3-C4 ACDF, and separate C5-C7 ACDF as detailed above. No obvious       acute hardware complication.     -Trace C4-C5 anterolisthesis noted, age indeterminate, probably degenerative in     nature given advanced left sided C4-C5 facet arthropathy.     -Osteopenia limits of variation of bony detail. No definite acute fracture     identified.          Degenerative changes most pronounced at C4-C5, level between the operative     levels.          See additional details above.                 CT Results   (Last 48 hours)                                    08/07/20 1321    CT SPINE CERV WO CONT  Final result            Impression:           Status post C3-C4 ACDF, and separate C5-C7 ACDF as detailed above. No obvious      acute hardware complication.      -Trace C4-C5 anterolisthesis noted, age indeterminate, probably degenerative in      nature given advanced left sided C4-C5 facet arthropathy.      -Osteopenia limits of variation of bony detail. No definite acute fracture      identified.             Degenerative changes most pronounced at C4-C5, level between the operative      levels.             See additional details above.                       Narrative:    EXAMINATION: CT cervical spine without contrast             INDICATION: Neck pain, postoperative cervical spine              COMPARISON: None             TECHNIQUE: CT cervical spine without contrast with multiplanar reformations. All      CT scans at this facility are performed using dose optimization technique as      appropriate to a performed exam, to include automated exposure control,      adjustment of the mA and/or kV according to patient size (including appropriate      matching first site specific examinations), or use of iterative reconstruction      technique.             FINDINGS:             Postoperative: Status post C3-C4 ACDF without obvious acute complication, with      evidence of solid interbody fusion. A separate appearing C5-C7 ACDF with      suggestion of C6 corpectomy and probable bone graft filling, with at least some      component of C5-C7 solid interbody fusion suspected. Osteopenia limits      evaluation of bony detail. No obvious acute hardware complication.             General: Postoperative findings largely discussed above. Vertebral body heights      preserved. Disc space loss in the postoperative levels as  well as C4-C5, and      C7-T1. Trace C4-C5 anterolisthesis. Mild straightening of the normal lordosis.      Facet arthropathy most notable at left C4-C5. No definite acute fracture      identified. Osteopenia limits of motion of bony detail.             Miscellaneous: No significant findings identified outside of the cervical spine      region.             Levels:      -Postoperative levels described above.      -No obvious large disc herniation although hardware related artifact limits      evaluation.      -Trace C4-C5 anterolisthesis.      -Facet arthropathy most notable at left C4-C5.      -No more than mild spinal canal stenosis suspected at any level. Multifocal      roughly mild foraminal stenosis.                                 CXR Results   (Last 48 hours)          None                    Medical Decision Making     I am the first provider for this patient.      I reviewed the vital signs,  available nursing notes, past medical history, past surgical history, family history and social history.      Vital Signs-Reviewed the patient's vital signs.   Patient Vitals for the past 12 hrs:            Temp  Pulse  Resp  BP  SpO2            08/07/20 1158  98.3 ??F (36.8 ??C)  70  16  (!) 105/58  98 %           Records Reviewed: Nursing Notes and Old Medical Records      Provider Notes (Medical Decision Making):    DDx: Cervical strain, Muscle spasm      55 yo F who presents with right lateral neck pain x5 days.  Denies known trauma or injury.  Previous history of multiple cervical spine surgeries.  On exam TTP to right lateral neck.  No nuchal rigidity.  MVI.  CT scan shows hardware is in place with  no acute findings.  Will treat patient symptomatically and discussed need for Ortho follow-up. At time of discharge, pt non-toxic appearing in NAD. Pt stable for prompt outpatient follow-up with PCP 1 to 2 days.  Patient given strict instructions to return  if symptoms worsen.         ED Course:    Initial assessment performed. The patients presenting problems have been discussed, and they are in agreement with the care plan formulated and outlined with them.  I have encouraged them to ask questions as they arise throughout their visit.             Disposition:   Discussed imaging results with pt along with dx and treatment plan. Discussed importance of PCP follow up. All questions answered. Pt voiced they understood. Return if sx worsen.          PLAN:   1.      Discharge Medication List as of 08/07/2020  1:50 PM  CONTINUE these medications which have NOT CHANGED          Details        oxyCODONE-acetaminophen (PERCOCET) 5-325 mg per tablet  Take 1 Tablet by mouth every eight (8) hours as needed for Pain for up to 4 days. Max Daily Amount: 3 Tablets., Normal, Disp-12 Tablet, R-0               aspirin 81 mg chewable tablet  Take 81 mg by mouth daily., Historical Med               furosemide (Lasix) 40 mg  tablet  Take 40 mg by mouth daily as needed (swelling)., Historical Med               pregabalin (LYRICA) 200 mg capsule  Take 1 Capsule by mouth three (3) times daily., Normal, Disp-90 Capsule, R-2               cyclobenzaprine (FLEXERIL) 5 mg tablet  Take 1 Tablet by mouth nightly as needed for Muscle Spasm(s)., Normal, Disp-30 Tablet, R-1               ipratropium-albuteroL (Combivent Respimat) 20-100 mcg/actuation inhaler  Take 1 Puff by inhalation every six (6) hours., Normal, Disp-4 g, R-3               fluticasone propionate (FLONASE) 50 mcg/actuation nasal spray  1 spray by nasal route daily, Normal, Disp-1 Each, R-2               hydrOXYzine HCL (ATARAX) 25 mg tablet  Take 25 mg by mouth two (2) times daily as needed for Anxiety., Historical Med               albuterol (PROVENTIL HFA, VENTOLIN HFA, PROAIR HFA) 90 mcg/actuation inhaler  Take 2 Puffs by inhalation every six (6) hours as needed., Historical Med               cetirizine (ZYRTEC) 10 mg tablet  Take 10 mg by mouth daily., Historical Med               lamoTRIgine (LaMICtal) 25 mg tablet  75 mg nightly., Historical Med               metoprolol succinate (TOPROL-XL) 25 mg XL tablet  Take 25 mg by mouth daily., Historical Med               omeprazole (PRILOSEC) 40 mg capsule  Take 40 mg by mouth daily., Historical Med               pravastatin (PRAVACHOL) 40 mg tablet  Take 40 mg by mouth nightly.  , Historical Med               warfarin (COUMADIN) 2.5 mg tablet  2.5 mg. Takes  MWF and takes 3.75 mg Tue, Thrs, Sat, Sunday, Historical Med                      2.      Follow-up Information               Follow up With  Specialties  Details  Why  Contact Info              Caryl Ada, MD  Family Medicine  Schedule an appointment as soon as possible for a visit in 1 day    [address]   [address]  [place] [address]   [phone]                 [facility] [address]    Schedule an appointment as soon as possible for a visit in 1 day    [address]   [address]   [phone]              [facility] EMERGENCY DEPT  Emergency Medicine    As needed, If symptoms worsen  [address] [facility] [address]   [phone]             Return to ED if worse         Diagnosis        Clinical Impression:       1.  Anterolisthesis of cervical spine      2.  Osteopenia of spine         3.  Cervical arthritis            Attestations:      Joaquim Lai, PA      Please note that this dictation was completed with Dragon, the computer voice recognition software.  Quite often unanticipated grammatical, syntax, homophones, and other interpretive errors are  inadvertently transcribed by the computer software.  Please disregard these errors.  Please excuse any errors that have escaped final proofreading.  Thank you.

## 2020-08-07 NOTE — ED Notes (Signed)
Per EMS, Patient c/o neck pain. She states she has plates and screws in the back of her neck since 1999. She states Tuesday she woke up with right side of her neck hurting.

## 2020-08-07 NOTE — Telephone Encounter (Signed)
Patient called again and said that she went to [facility] ER today 08/07/2020. That they did a CT Scan of Cervical Spine.     Patient said that [facility] Told her to follow up with Dr.Arora as soon as possible.     Patient is asking if she can be worked in to see Dr.Arora on 08/10/20 Monday.    Patient tel. [phone].    Note : patient aware Dr. Wilford Corner is out of the office today.

## 2020-08-07 NOTE — Progress Notes (Signed)
Patient left voicemail message at 7:36 AM. CTN returned call to patient. No answer. Left HIPPA compliant message. Name, role and contact information provided. Requested return call.      Patient returned call. Patient reported she is at [facility] for right neck pain. Patient audibly crying and asked CTN if she can get someone in her room to give her a better chair with neck support. Informed patient CTN is unable to provide this type of assistance and suggested using call bell if available. During call, patient saw a staff member walking by and asked for chair. CTN informed patient per Dr. Andrey Campanile to ice for swelling 10-15 mins and to avoid heat unless being used at PT. Patient verbalized understanding. CTN will monitor for d/c.

## 2020-08-09 NOTE — Telephone Encounter (Signed)
Dr. Wilford Corner does not have any openings this week.  She can either see the NP or call for cancellations.

## 2020-08-10 ENCOUNTER — Encounter

## 2020-08-10 MED ORDER — OXYCODONE-ACETAMINOPHEN 5 MG-325 MG TAB
5-325 mg | ORAL_TABLET | Freq: Three times a day (TID) | ORAL | 0 refills | Status: DC | PRN
Start: 2020-08-10 — End: 2020-08-10

## 2020-08-10 MED ORDER — OXYCODONE-ACETAMINOPHEN 5 MG-325 MG TAB
5-325 mg | ORAL_TABLET | ORAL | 0 refills | Status: AC
Start: 2020-08-10 — End: 2020-08-17

## 2020-08-10 NOTE — Progress Notes (Signed)
 Care Transitions Follow Up Call    Challenges to be reviewed by the provider   Additional needs identified to be addressed with provider: no  none           Method of communication with provider : none    Care Transition Nurse (CTN) contacted the patient by telephone to follow up after admission on 07/05/20. Verified name and DOB with patient as identifiers.    Patient was seen at [facility] 3/25 for c/o right neck pain. Prescribed lidcocaine patches. Patient voiced she is waiting on prior authorization for prescription. CTN suggested [place] if needed while awaiting prior auth. Patient rated right middle finger as 4.5/10. Patient verbalized she is performing home exercises and can no just about everything she wants to do with right hand. Patient denied redness, warmth, drainage and reported surgical wound is almost completely healed. Does have stiffness and swelling.     Addressed changes since last contact: none  Follow up appointment completed? yes.   Was follow up appointment scheduled within 7 days of discharge? yes.     Advance Care Planning:   Does patient have an Advance Directive:  currently not on file; education provided     CTN reviewed discharge instructions, medical action plan and red flags with patient and discussed any barriers to care and/or understanding of plan of care after discharge. Discussed appropriate site of care based on symptoms and resources available to patient including: PCP, Specialist and CTN. The patient agrees to contact the PCP office for questions related to their healthcare.     Patients top risk factors for readmission: none   Interventions to address risk factors: none    [facility] follow up appointment(s):   Future Appointments   Date Time Provider Department Center   08/11/2020 12:15 PM Millicent Moats, OT [facility]   08/12/2020  8:00 AM [place] Adonna BROCKS, NP [facility]   08/13/2020 10:45 AM Burnette Clapper, OTA [facility]   08/18/2020 12:15 PM Millicent Moats, OT [facility]      Non-[facility] follow up appointment(s): Pain management 09/18/20    CTN provided contact information for future needs. No further follow-up call indicated based on severity of symptoms and risk factors.  Plan for next call: N/A     Goals Addressed                 This Visit's Progress    . COMPLETED: Attends follow-up appointments as directed.   On track     Goal: Patient will attend all appointments scheduled within the next 30 days.      Ensure provider appt is scheduled within 7 business days post-discharge  2/24: TOC appt scheduled for 07/10/20 @ 240 PM   Confirm patient attended post-discharge provider appt ; 3/3: Patient attended ortho follow up on 3/2  Complete post-visit call to confirm attendance and update care needs; 3/3: Done. 3/11: Done. 3/23: Attempted. 3/24: Done. 3/25: Done. 3/28: Done.         . COMPLETED: Coordinate surgical pain management   On track     Obtain and maintain tolerable level of pain.    Pain goal: 3/10  3/3: Patient reported 7/10 pain to surgical incision since suture removal on 3/2. Patient voiced she is out of Percocet. CTN sent request for Percocet. CTN verified pharmacy on file.   3/28: Patient reported right middle finger pain is 4.5/10. Patient will continue to perform home exercised, attend outpatient PT and take pain medication as  prescribed.       . COMPLETED: Prevent complications post hospitalization.   On track     Goal: No admissions post 30 days from discharge of 07/10/20.      Review/educate common or potential red flags of condition worsening; 3/3: redness, warmth at site(s), swelling, bleeding or pus-like drainage, chills, fever (> 100.5), low body temperature (<97.2), nausea/vomiting that prevents eating/drinking/taking medications, SOB, chest pain/pressure, increased respirations, decreased urine output. 3/24: Patient requested increased swelling.   Evaluate adherence to medications and priority barriers to resolve; 3/3: Patient taking Augmentin. 3/28: Completed  Augmentin       Observe for trends in symptoms and measures, provide direction to patient, and notify provider as needed; 3/24: Patient reported increased swelling to operative finger (right middle) after attending therapy. Requesting pain medication stronger than Tylenol .      Discuss and provide resources for ACP; 3/3: Not on file. Will address. 3/28: Patient declined at this time.                Patient has graduated from the Transitions of Care Coordination  program on 08/10/20.  Patient/family has the ability to self-manage at this time Care management goals have been completed. Patient was not referred to the [facility] team for further management.     Goals Addressed                 This Visit's Progress    . COMPLETED: Attends follow-up appointments as directed.   On track     Goal: Patient will attend all appointments scheduled within the next 30 days.      Ensure provider appt is scheduled within 7 business days post-discharge  2/24: TOC appt scheduled for 07/10/20 @ 240 PM   Confirm patient attended post-discharge provider appt ; 3/3: Patient attended ortho follow up on 3/2  Complete post-visit call to confirm attendance and update care needs; 3/3: Done. 3/11: Done. 3/23: Attempted. 3/24: Done. 3/25: Done. 3/28: Done.         . COMPLETED: Coordinate surgical pain management   On track     Obtain and maintain tolerable level of pain.    Pain goal: 3/10  3/3: Patient reported 7/10 pain to surgical incision since suture removal on 3/2. Patient voiced she is out of Percocet. CTN sent request for Percocet. CTN verified pharmacy on file.   3/28: Patient reported right middle finger pain is 4.5/10. Patient will continue to perform home exercised, attend outpatient PT and take pain medication as prescribed.       . COMPLETED: Prevent complications post hospitalization.   On track     Goal: No admissions post 30 days from discharge of 07/10/20.      Review/educate common or potential red flags of condition worsening; 3/3:  redness, warmth at site(s), swelling, bleeding or pus-like drainage, chills, fever (> 100.5), low body temperature (<97.2), nausea/vomiting that prevents eating/drinking/taking medications, SOB, chest pain/pressure, increased respirations, decreased urine output. 3/24: Patient requested increased swelling.   Evaluate adherence to medications and priority barriers to resolve; 3/3: Patient taking Augmentin. 3/28: Completed Augmentin       Observe for trends in symptoms and measures, provide direction to patient, and notify provider as needed; 3/24: Patient reported increased swelling to operative finger (right middle) after attending therapy. Requesting pain medication stronger than Tylenol .      Discuss and provide resources for ACP; 3/3: Not on file. Will address. 3/28: Patient declined at this time.  Patient has Care Transition Nurse's contact information for any further questions, concerns, or needs.  Patients upcoming visits:    Future Appointments   Date Time Provider Department Center   08/11/2020 12:15 PM Millicent Moats, OT [facility]   08/12/2020  8:00 AM [place] Adonna BROCKS, NP [facility]   08/13/2020 10:45 AM Burnette Clapper, OTA [facility]   08/18/2020 12:15 PM Millicent Moats, OT [facility]

## 2020-08-10 NOTE — Telephone Encounter (Signed)
Patient is scheduled for 3/30.    Patient also wanted to note that she has not been seen by pain management due to needing hand surgery, and is scheduled for pain management on 5/6.

## 2020-08-10 NOTE — Telephone Encounter (Signed)
Ok refill

## 2020-08-11 ENCOUNTER — Inpatient Hospital Stay: Admit: 2020-08-11 | Payer: MEDICARE | Primary: Family Medicine

## 2020-08-11 NOTE — Progress Notes (Signed)
 OT DAILY TREATMENT NOTE 06-21    Patient Name: Mary [name]  Date:08/11/2020  DOB: 08/12/1965  [x]   Patient DOB Verified  [place]or: [place] MED[place] / Plan: [place] MEDICARE PART A & B / Product Type: Medicare /    In time:12:16 PM  Out time:12:54 PM  Total Treatment Time (min): 38  Visit #: 4 of 8    Medicare/BCBS Only   Total Timed Codes (min):  38 1:1 Treatment Time:  38     Treatment Area: Pain in left hand [M79.642]  Other specified soft tissue disorders [M79.89]  Other specified postprocedural states [Z98.890]    SUBJECTIVE  Pain Level (0-10 scale): 5/10  Any medication changes, allergies to medications, adverse drug reactions, diagnosis change, or new procedure performed?: [x]  No    []  Yes (see summary sheet for update)  Subjective functional status/changes:   []  No changes reported  I have the pain because it is cold out.    OBJECTIVE    Modality rationale: decrease pain and increase tissue extensibility to improve the patient's ability to functionally use right hand   Min Type Additional Details    []  Estim:  [] Unatt       [] IFC  [] Premod                        [] Other:  [] w/ice   [] w/heat  Position:  Location:    []  Estim: [] Att    [] TENS instruct  [] NMES                    [] Other:  [] w/US    [] w/ice   [] w/heat  Position:  Location:    []   Traction: []  Cervical       [] Lumbar                       []  Prone          [] Supine                       [] Intermittent   [] Continuous Lbs:  []  before manual  []  after manual    []   Ultrasound: [] Continuous   []  Pulsed                           []   [] W/cm2:  Location:    []   Iontophoresis with dexamethasone         Location: []  Take home patch   []  In clinic   8 concurrent with self care []   Ice     [x]   Heat MHP  []   Ice massage  []   Laser   []   Paraffin Position: seated, resting  Location: right hand    []   Laser with stim  []   Other:  Position:  Location:    []   Vasopneumatic Device    []   Right     []   Left  Pre-treatment girth:  Post-treatment girth:  Measured at  (location):  Pressure:       []  lo []  med []  hi   Temperature: []  lo []  med []  hi       [x]  Skin assessment post-treatment:  [x] intact [x] redness- no adverse reaction    [] redness - adverse reaction:     13 min Therapeutic Exercise:  [x]  See flow sheet :   Rationale: increase ROM and increase strength to improve the patient's ability  to grasp, make a composite fist with right hand    15 min Therapeutic Activity:  [x]   See flow sheet :   Rationale: increase ROM and improve coordination  to improve the patient's ability to manipulate items using right hand.     10 min Self Care/Home Management: edema mgmt, activity modifications, scar mgmt   Rationale: education  to improve the patient's ability to improve functional use of right hand.     With   []  TE   []  TA   []  neuro   []  other: Patient Education: [x]  Review HEP    []  Progressed/Changed HEP based on:   []  positioning   []  body mechanics   []  transfers   []  heat/ice application   []  Splint wear/care   []  Sensory re-education   []  scar management      []  other:            Other Objective/Functional Measures:   Status at eval: Right long finger PIP jt circum 6.9 cm  08/04/20: 6.5cmPIP jt circumright long finger    08/06/20: 6.4cm PIP jt circum right long finger.    08/11/2020 - 5.5 cm PIP jt circum right long finger     Single Digit ROM CHART as measured in degrees  Digit  A/P 07/24/2020  Right MF 07/24/2020   Right IF   MP 0-66 70   PIP 20-90 104   DIP 0-42 38   TAM 178 212     Single Digit ROM CHART as measured in degrees  Digit  A/P 08/11/2020   Right MF 08/11/2020  Right IF   MP 0-86    PIP 8-92    DIP 0-50    TAM 220        Pain Level (0-10 scale) post treatment: 4/10    ASSESSMENT/Changes in Function: Pt demonstrates reduced right middle finger edema and pt has met this goal.  She also demonstrates improved digit AROM and functional fist.  She continues to have pain with gripping but overall progressing well towards goals.      Patient will continue to benefit  from skilled OT services to modify and progress therapeutic interventions, address ROM deficits, address strength deficits, analyze and address soft tissue restrictions and instruct in home and community integration to attain remaining goals.     []   See Plan of Care  []   See progress note/recertification  []   See Discharge Summary         Progress towards goals / Updated goals:  Short Term Goals: To be accomplished in 2 weeks:  Goal:* Patient will be compliant with initial home exercise program to take an active role in their rehabilitation process.  Status at Eval: Patient was provided with a basic home exercise program including tendon glides and digit blocking exercises.  08/04/20: min cues for proper positioning with tendon glides and blocking exercises.  08/11/2020 - pt reports and demonstrates good compliance with HEP, goal met    Goal:* Patient will demonstrate a good understanding of their condition and strategies for self-management.  Status at Eval: pt educated on prognosis, diagnosis, treatment plan, activity modifications, desensitization program, scar mgmt  08/04/20: reviewed scar management and edema management strategies.  08/11/2020 - pt reports compliance with strategies and demonstrates reduced edema in the right hand middle finger, goal met    Long Term Goals:To be accomplished in 4 weeks:  Goal:*Patient will regain 220 degrees total arc of motion of the right index finger to enable  grasp of cylindrical objects such as a glass, handle or toothbrush.  Status at Eval: 212 deg TAM    Goal:*Patient will regain 220 degrees total arc of motion of the right long finger to enable grasp of cylindrical objects such as a glass, handle or toothbrush.  Status at Eval: 178 deg  08/11/2020 - 220 deg TAM of right MF, goal met    Goal:*Patient will attain 5 degrees or less of right long finger digital extension to enable her to reach into pocket.  Status at Eval: 20 deg extension lag    Goal:* Pt  will demonstrate 0.3 cm decrease is right long finger PIP jt in order to improve ability to make a composite fist using right hand.  Status at eval: Right long finger PIP jt circum 6.9 cm  08/04/20: 6.5cmPIP jt circumright long finger    08/06/20: 6.4cm PIP jt circum right long finger.   08/11/2020 - 5.5 cm PIP jt circum right long finger, goal met    Goal:* Patient will show a 15 point improvement on FOTO functional status measure to improve overall functional performance.  Status at Eval: 5    PLAN  []   Upgrade activities as tolerated     [x]   Continue plan of care  []   Update interventions per flow sheet       []   Discharge due to:_  []   Other:_      Powell Getting, OT 08/11/2020  12:16 PM    Future Appointments   Date Time Provider Department Center   08/12/2020  8:00 AM [place] Adonna BROCKS, NP [facility]   08/13/2020 10:45 AM [place] Clapper, OTA [facility]   08/18/2020 12:15 PM Getting Powell, OT [facility]

## 2020-08-12 ENCOUNTER — Ambulatory Visit: Admit: 2020-08-12 | Discharge: 2020-08-12 | Payer: MEDICARE | Attending: Nurse Practitioner | Primary: Family Medicine

## 2020-08-12 ENCOUNTER — Ambulatory Visit: Attending: Nurse Practitioner | Primary: Family Medicine

## 2020-08-12 DIAGNOSIS — M542 Cervicalgia: Secondary | ICD-10-CM

## 2020-08-12 MED ORDER — TIZANIDINE 2 MG TAB
2 mg | ORAL_TABLET | Freq: Three times a day (TID) | ORAL | 0 refills | Status: DC | PRN
Start: 2020-08-12 — End: 2020-10-01

## 2020-08-12 MED ORDER — METHYLPREDNISOLONE 4 MG TABS IN A DOSE PACK
4 mg | ORAL | 0 refills | Status: DC
Start: 2020-08-12 — End: 2020-10-01

## 2020-08-12 NOTE — Telephone Encounter (Signed)
146/80

## 2020-08-12 NOTE — Progress Notes (Signed)
Returned missed call to patient. No answer. Left HIPPA compliant message. Name, role and contact information provided. Requested return call.

## 2020-08-12 NOTE — Progress Notes (Signed)
 Mary [name] presents today for   Chief Complaint   Patient presents with   . Neck Pain   . Back Pain     upper       Is someone accompanying this pt? no    Is the patient using any DME equipment during OV? no    Learning Assessment:  Learning Assessment 08/12/2020   PRIMARY LEARNER Patient   PRIMARY LANGUAGE ENGLISH   LEARNER PREFERENCE PRIMARY READING     LISTENING   ANSWERED BY patient   RELATIONSHIP SELF     Coordination of Care:  1. Have you been to the ER, urgent care clinic since your last visit? Yes, pt went to the ER for her neck pain.  Hospitalized since your last visit? no    2. Have you seen or consulted any other health care providers outside of the [facility] since your last visit? no Include any pap smears or colon screening. no

## 2020-08-12 NOTE — Progress Notes (Signed)
Chief complaint   Chief Complaint   Patient presents with   ??? Neck Pain   ??? Back Pain     upper       History of Present Illness:  Mary [name] is a  55 y.o.  female who comes in today after last being seen by Dr. Delice Lesch on June 03, 2020 for her chronic neck pain.  Patient states that she had to go to the [facility] view ER by ambulance on August 07, 2020.  She states she did not have transportation so she had to call EMS to take her.  She states she went because she had right-sided neck pain for 5 days without injury.  She states her pain is at the right side of the base of her skull and that she has a chronic knot on the left side of her neck.  On review of the ER record it shows they did a CT scan of the neck that showed a trace C4-5 anterolisthesis with left C 4 5 facet arthropathy.  Surgical instrumentation was intact.  She has had 3 previous neck surgeries in 1997 through 1999.  She is fused from seen 3 4 and C5-C7.  Patient also has other chronic pain issues including her hips and has been referred to pain management.  She states she was supposed to see pain management in March but had to reschedule it till May 6 due to having had an I&D on her right middle finger due to an infected splinter.  She is on Lyrica 20 mg 3 times a day which she states does help somewhat and also Flexeril 5 mg at nighttime which also helps with her neck pain.  She has tried ice and heat.  She states heat works the best.  The ER gave her Lidoderm patches which she is still waiting on prior authorization and they gave her Percocet 5/325.  Upon review of her PMP she has been getting Percocet from Ortho due to her finger surgery and being in physical therapy for that.  She denies fever bowel bladder dysfunction.  She is on chronic O2 at 2 L/min.  She states she has been off oxygen since she had COVID in December.      Physical Exam: Patient is a 55 year old female well-developed well-nourished who is alert and oriented with a normal mood  and affect.  She has a full weightbearing nonantalgic gait.  She is on O2 via nasal cannula at 2 L/min.  She has 4 out of 5 strength bilateral upper extremities.  Negative Hoffmann's on the left.  I did not test the Hoffmann's on the right due to that being her surgical finger.  I cannot really appreciate a knot on the left side of her neck.  It just feels like bony skull upon palpation.  She is a little bit tight in the right musculature of the neck.      Assessment and Plan: This is patient is having a flare of neck pain.  I will give her a Medrol Dosepak to see if we can calm this down.  She knows to take it with food and not take other anti-inflammatories with it.  I will change her Flexeril to Zanaflex and she says the Flexeril makes her too sleepy to take during the day.  She can take the Zanaflex up to 3 times a day.  She will follow-up in 6 weeks.  She should have had her pain management appointment by that time.  Medications:  Current Outpatient Medications   Medication Sig Dispense Refill   ??? methylPREDNISolone (Medrol, Pak,) 4 mg tablet Per dose pack instructions 1 Dose Pack 0   ??? tiZANidine (ZANAFLEX) 2 mg tablet Take 1 Tablet by mouth three (3) times daily as needed for Muscle Spasm(s). 90 Tablet 0   ??? oxyCODONE-acetaminophen (PERCOCET) 5-325 mg per tablet take 1 tablet by mouth every 8 hours AS NEEDED FOR PAIN FOR UP TO 4 DAYS 3 TABS MAX 12 Tablet 0   ??? aspirin 81 mg chewable tablet Take 81 mg by mouth daily.     ??? furosemide (Lasix) 40 mg tablet Take 40 mg by mouth daily as needed (swelling).     ??? pregabalin (LYRICA) 200 mg capsule Take 1 Capsule by mouth three (3) times daily. 90 Capsule 2   ??? cyclobenzaprine (FLEXERIL) 5 mg tablet Take 1 Tablet by mouth nightly as needed for Muscle Spasm(s). 30 Tablet 1   ??? ipratropium-albuteroL (Combivent Respimat) 20-100 mcg/actuation inhaler Take 1 Puff by inhalation every six (6) hours. 4 g 3   ??? fluticasone propionate (FLONASE) 50 mcg/actuation nasal spray  1 spray by nasal route daily 1 Each 2   ??? hydrOXYzine HCL (ATARAX) 25 mg tablet Take 25 mg by mouth two (2) times daily as needed for Anxiety.     ??? albuterol (PROVENTIL HFA, VENTOLIN HFA, PROAIR HFA) 90 mcg/actuation inhaler Take 2 Puffs by inhalation every six (6) hours as needed.     ??? cetirizine (ZYRTEC) 10 mg tablet Take 10 mg by mouth daily.     ??? lamoTRIgine (LaMICtal) 25 mg tablet Take 75 mg by mouth daily.     ??? metoprolol succinate (TOPROL-XL) 25 mg XL tablet Take 25 mg by mouth daily.     ??? omeprazole (PRILOSEC) 40 mg capsule Take 40 mg by mouth daily.     ??? pravastatin (PRAVACHOL) 40 mg tablet Take 40 mg by mouth nightly.       ??? warfarin (COUMADIN) 2.5 mg tablet 2.5 mg. Takes 5mg  MWF and takes 3.75 mg Tue, Thrs, Sat, Sunday     ??? lidocaine (Lidoderm) 5 % Apply patch to the affected area for 12 hours a day and remove for 12 hours a day. (Patient not taking: Reported on 08/12/2020) 30 Each 0           Review of systems:    Past Medical History:   Diagnosis Date   ??? Anxiety    ??? Bipolar 1 disorder (HCC)    ??? CHF (congestive heart failure) (HCC)    ??? COPD (chronic obstructive pulmonary disease) (HCC)    ??? COPD (chronic obstructive pulmonary disease) (HCC)    ??? H/O aortic valve replacement    ??? PTSD (post-traumatic stress disorder)      Past Surgical History:   Procedure Laterality Date   ??? HAND/FINGER SURGERY UNLISTED Right     I&D right middle finger   ??? HX HEART CATHETERIZATION     ??? HX HEART VALVE SURGERY      mitral valve replacement     Social History     Socioeconomic History   ??? Marital status: DIVORCED     Spouse name: Not on file   ??? Number of children: Not on file   ??? Years of education: Not on file   ??? Highest education level: Not on file   Occupational History   ??? Not on file   Tobacco Use   ??? Smoking status: Current Every  Day Smoker     Packs/day: 1.00   ??? Smokeless tobacco: Never Used   Substance and Sexual Activity   ??? Alcohol use: Never   ??? Drug use: Yes     Types: Marijuana   ??? Sexual  activity: Not on file   Other Topics Concern   ??? Not on file   Social History Narrative   ??? Not on file     Social Determinants of Health     Financial Resource Strain:    ??? Difficulty of Paying Living Expenses: Not on file   Food Insecurity:    ??? Worried About Running Out of Food in the Last Year: Not on file   ??? Ran Out of Food in the Last Year: Not on file   Transportation Needs:    ??? Lack of Transportation (Medical): Not on file   ??? Lack of Transportation (Non-Medical): Not on file   Physical Activity:    ??? Days of Exercise per Week: Not on file   ??? Minutes of Exercise per Session: Not on file   Stress:    ??? Feeling of Stress : Not on file   Social Connections:    ??? Frequency of Communication with Friends and Family: Not on file   ??? Frequency of Social Gatherings with Friends and Family: Not on file   ??? Attends Religious Services: Not on file   ??? Active Member of Clubs or Organizations: Not on file   ??? Attends Banker Meetings: Not on file   ??? Marital Status: Not on file   Intimate Partner Violence:    ??? Fear of Current or Ex-Partner: Not on file   ??? Emotionally Abused: Not on file   ??? Physically Abused: Not on file   ??? Sexually Abused: Not on file   Housing Stability:    ??? Unable to Pay for Housing in the Last Year: Not on file   ??? Number of Places Lived in the Last Year: Not on file   ??? Unstable Housing in the Last Year: Not on file     Family History   Problem Relation Age of Onset   ??? No Known Problems Mother        Physical Exam:  Visit Vitals  Pulse 75   Temp 97.4 ??F (36.3 ??C) (Temporal)   Ht 5\' 3"  (1.6 m)   Wt 199 lb (90.3 kg)   SpO2 97% Comment: 2 LPM via NC   BMI 35.25 kg/m??     Pain Scale: 7/10      PMP has been .reviewed and is appropriate          Diagnoses and all orders for this visit:    1. Neck pain  -     methylPREDNISolone (Medrol, Pak,) 4 mg tablet; Per dose pack instructions    2. Myofascial pain  -     tiZANidine (ZANAFLEX) 2 mg tablet; Take 1 Tablet by mouth three (3) times  daily as needed for Muscle Spasm(s).            Follow-up and Dispositions    ?? Return in about 6 weeks (around 09/23/2020) for with Dr. 11/23/2020.             We have informed Mary [name] to notify Mary [name] for immediate appointment if she has any worsening neurogical symptoms or if an emergency situation presents, then call 911

## 2020-08-12 NOTE — Telephone Encounter (Signed)
Patient states she had her blood pressure taken on her visit toady, and that it was extremely high. Patient is requesting to know what her blood pressure was so that she can notify her PCP. Patients numbers had not been added in for today's visit when she called. Please advise.    Patient can be reached at [phone].

## 2020-08-12 NOTE — Telephone Encounter (Signed)
Call Patient. Use the two identifiers. Communicated to patient that her blood pressure was 146/80. No further action is needed.

## 2020-08-13 ENCOUNTER — Inpatient Hospital Stay: Admit: 2020-08-13 | Payer: MEDICARE | Primary: Family Medicine

## 2020-08-13 NOTE — Progress Notes (Signed)
OT DAILY TREATMENT NOTE 06-21    Patient Name: Mary [name]  Date:08/13/2020  DOB: July 19, 1965  [x]   Patient DOB Verified  Payor: [place] MEDICARE / Plan: [place] MEDICARE PART A & B / Product Type: Medicare /    In time: 10:50  Out time: 11:26  Total Treatment Time (min): 36  Visit #: 5 of 8    Medicare/BCBS Only   Total Timed Codes (min):  36 1:1 Treatment Time:  36     Treatment Area: Pain in left hand [M79.642]  Other specified soft tissue disorders [M79.89]  Other specified postprocedural states [Z98.890]    SUBJECTIVE  Pain Level (0-10 scale): 4/10  Any medication changes, allergies to medications, adverse drug reactions, diagnosis change, or new procedure performed?: [x]  No    []  Yes (see summary sheet for update)  Subjective functional status/changes:   []  No changes reported    "My finger is more healed up"   "I forget to get my oxygen tank before I left the house"  "my whole hand is bothering me today"    OBJECTIVE    Modality rationale: decrease pain and increase tissue extensibility to improve the patient's ability to functionally use right hand   Min Type Additional Details     [] ? Estim:  [] ?Unatt       [] ?IFC  [] ?Premod                        [] ?Other:  [] ?w/ice   [] ?w/heat  Position:  Location:     [] ? Estim: [] ?Att    [] ?TENS instruct  [] ?NMES                    [] ?Other:  [] ?w/US   [] ?w/ice   [] ?w/heat  Position:  Location:     [] ?  Traction: [] ? Cervical       [] ?Lumbar                       [] ? Prone          [] ?Supine                       [] ?Intermittent   [] ?Continuous Lbs:  [] ? before manual  [] ? after manual     [] ?  Ultrasound: [] ?Continuous   [] ? Pulsed                           [] ?   [] ? W/cm2:  Location:     [] ?  Iontophoresis with dexamethasone         Location: [] ? Take home patch   [] ? In clinic    8 concurrent with self care [] ?  Ice     [x] ?  Heat MHP  [] ?  Ice massage  [] ?  Laser   [] ?  Paraffin Position: seated, resting  Location: right hand     [] ?  Laser with stim  [] ?  Other:   Position:  Location:     [] ?  Vasopneumatic Device    [] ?  Right     [] ?  Left  Pre-treatment girth:  Post-treatment girth:  Measured at (location):  Pressure:       [] ? lo [] ? med [] ? hi   Temperature: [] ? lo [] ? med [] ? hi      [x] ? Skin assessment post-treatment:  [x] ?intact [x] ?redness- no adverse reaction    [] ?redness - adverse  reaction:     11 min Therapeutic Exercise:  []  See flow sheet :   Rationale: increase ROM and increase strength to improve the patient's ability to grasp, make a composite fist with right hand    Right hand:     Towel scrunches   Digit blocking exercises  Digit extension     5 min Therapeutic Activity:  []   See flow sheet :   Rationale: increase ROM and improve coordination  to improve the patient's ability to manipulate items using right hand.     Right hand:     Translation with marbles sets of 3  Opposition with marbles        10 min Manual Therapy:  IASTM #6   The manual therapy interventions were performed at a separate and distinct time from the therapeutic activities interventions.  Rationale: decrease pain, increase ROM, increase tissue extensibility and decrease edema  to move IF and grip.     Edema massage with IASTM #6 on left IF     10 min Self Care/Home Management: edema mgmt, activity modifications, scar mgmt   Rationale: education  to improve the patient's ability to improve functional use of right hand.       With   []  TE   []  TA   []  neuro   []  other: Patient Education: [x]  Review HEP    [x]  Progressed/Changed HEP based on: min-mod cues for proper positioning with blocking and ROM HEP's.   []  positioning   []  body mechanics   []  transfers   []  heat/ice application   []  Splint wear/care   []  Sensory re-education   []  scar management      []  other:            Other Objective/Functional Measures:      Shortened tx session - Pt session ended early, pt needed to go home to get a new oxygen tank.   Pt unable to complete all digit ROM exercises due to increased pain.      Pain Level (0-10 scale) post treatment: 4.5/10    ASSESSMENT/Changes in Function: improved edema in MF pip jt, slight increase in pain with constant movement of digits for ROM tasks, decreased stiffness and pain with heat and massage modalities, pt self initiates breaks after each activity due to increase pain/discomfort in MF.     Patient will continue to benefit from skilled OT services to address ROM deficits, address strength deficits, analyze and address soft tissue restrictions and analyze and modify body mechanics/ergonomics to attain remaining goals.     [x]   See Plan of Care  []   See progress note/recertification  []   See Discharge Summary         Progress towards goals / Updated goals:    Short Term Goals: To be accomplished in 2 weeks:  Goal:* Patient will be compliant with initial home exercise program to take an active role in their rehabilitation process.  Status at Eval: Patient was provided with a basic home exercise program including tendon glides and digit blocking exercises.  08/04/20: min cues for proper positioning with tendon glides and blocking exercises.  08/11/2020 - pt reports and demonstrates good compliance with HEP, goal met    Goal:* Patient will demonstrate a good understanding of their condition and strategies for self-management.  Status at Eval: pt educated on prognosis, diagnosis, treatment plan, activity modifications, desensitization program, scar mgmt  08/04/20: reviewed scar management and edema management strategies.  08/11/2020 - pt reports  compliance with strategies and demonstrates reduced edema in the right hand middle finger, goal met    Long Term Goals:To be accomplished in 4 weeks:  Goal:*Patient will regain 220 degrees total arc of motion of the right index finger to enable grasp of cylindrical objects such as a glass, handle or toothbrush.  Status at Eval: 212 deg TAM    Goal:*Patient will regain 220 degrees total arc of motion of the right long  finger to enable grasp of cylindrical objects such as a glass, handle or toothbrush.  Status at Eval: 178 deg  08/11/2020 - 220 deg TAM of right MF, goal met    Goal:*Patient will attain 5 degrees or less of right long finger digital extension to enable her to reach into pocket.  Status at Eval: 20 deg extension lag    Goal:* Pt will demonstrate 0.3 cm decrease is right long finger PIP jt in order to improve ability to make a composite fist using right hand.  Status at eval: Right long finger PIP jt circum 6.9 cm  08/04/20: 6.5cmPIP jt circumright long finger  08/06/20: 6.4cm PIP jt circum right long finger.  08/11/2020 - 5.5 cm PIP jt circum right long finger, goal met    Goal:* Patient will show a 15 point improvement on FOTO functional status measure to improve overall functional performance.  Status at Eval: 14    PLAN  []   Upgrade activities as tolerated     [x]   Continue plan of care  []   Update interventions per flow sheet       []   Discharge due to:_  []   Other:_      Normajean Baxter, OTA 08/13/2020  10:57 AM    Future Appointments   Date Time Provider Department Center   08/18/2020 12:15 PM Aleen Campi, OT [facility]   10/01/2020 11:45 AM [name], MD [facility]

## 2020-08-14 ENCOUNTER — Encounter

## 2020-08-18 ENCOUNTER — Inpatient Hospital Stay: Admit: 2020-08-18 | Payer: MEDICARE | Primary: Family Medicine

## 2020-08-18 DIAGNOSIS — M79642 Pain in left hand: Secondary | ICD-10-CM

## 2020-08-18 NOTE — Progress Notes (Signed)
 [facility]  [address]  [place], [place] [address]  [phone]  [phone] fax  Patient Name: Mary [name]  Date:08/18/2020  DOB: [birth year]  [x]   Patient DOB Verified      Hand Therapy/ Occupational Therapy Discharge Instructions        Continue with home exercise program for 2 times a day for 3 weeks then decrease to 1 times a day as needed/patient discretion.    Continue to apply heat as needed per day.    Follow up with MD: as needed      Recommendations: _x___ Return to activity with home program            ____ Return to activity with the following modifcations                       ____ Post Rehab Program                                  ____Return to/Join local gym    Additional comments:        Please call with any questions or concerns.  Thank you for your participation.         Powell Getting, OT 08/18/2020  12:37 PM

## 2020-08-18 NOTE — Progress Notes (Signed)
OT DAILY TREATMENT NOTE 06-21    Patient Name: Mary [name]  Date:08/18/2020  DOB: 1966/02/26  [x]   Patient DOB Verified  Payor: [place] MEDICARE / Plan: VA MEDICARE PART A & B / Product Type: Medicare /    In time:12:17 PM  Out time:12:46 PM  Total Treatment Time (min): 29  Visit #: 6 of 8    Medicare/BCBS Only   Total Timed Codes (min):  29 1:1 Treatment Time:  29     Treatment Area: Pain in left hand [M79.642]  Other specified soft tissue disorders [M79.89]  Other specified postprocedural states [Z98.890]    SUBJECTIVE  Pain Level (0-10 scale): 0/10  Any medication changes, allergies to medications, adverse drug reactions, diagnosis change, or new procedure performed?: [x]  No    []  Yes (see summary sheet for update)  Subjective functional status/changes:   []  No changes reported  "I am doing good with the hand."    "I would be fine to be done."    OBJECTIVE    Modality rationale: decrease pain and increase tissue extensibility to improve the patient's ability to functionally use right hand.    Min Type Additional Details    []  Estim:  [] Unatt       [] IFC  [] Premod                        [] Other:  [] w/ice   [] w/heat  Position:  Location:    []  Estim: [] Att    [] TENS instruct  [] NMES                    [] Other:  [] w/US   [] w/ice   [] w/heat  Position:  Location:    []   Traction: []  Cervical       [] Lumbar                       []  Prone          [] Supine                       [] Intermittent   [] Continuous Lbs:  []  before manual  []  after manual    []   Ultrasound: [] Continuous   []  Pulsed                           []   [] W/cm2:  Location:    []   Iontophoresis with dexamethasone         Location: []  Take home patch   []  In clinic   5 concurrent with self care []   Ice     [x]   Heat MHP  []   Ice massage  []   Laser   []   Paraffin Position: seated, resting  Location: right hand    []   Laser with stim  []   Other:  Position:  Location:    []   Vasopneumatic Device    []   Right     []   Left  Pre-treatment  girth:  Post-treatment girth:  Measured at (location):  Pressure:       []  lo []  med []  hi   Temperature: []  lo []  med []  hi       [x]  Skin assessment post-treatment:  [x] intact [x] redness- no adverse reaction    [] redness - adverse reaction:     19 min Therapeutic Exercise:  [x]  See flow sheet :   Rationale: increase  ROM, increase strength and improve coordination to improve the patient's ability to grasp, grip, manipulate items using right hand.     10 min Self Care/Home Management: d/c instruct   Rationale: education  to improve the patient's ability to return to full functional use of right hand.     With   []  TE   []  TA   []  neuro   []  other: Patient Education: [x]  Review HEP    []  Progressed/Changed HEP based on:   []  positioning   []  body mechanics   []  transfers   []  heat/ice application   []  Splint wear/care   []  Sensory re-education   []  scar management      []  other:            Other Objective/Functional Measures:   Status at eval: Right long finger PIP jt circum 6.9 cm  08/04/20: 6.5cmPIP jt circumright long finger  08/06/20: 6.4cm PIP jt circum right long finger.  08/11/2020 - 5.5 cm PIP jt circum right long finger    Single Digit ROM CHART as measured in degrees  Digit  A/P 07/24/2020  Right MF 07/24/2020  Right IF   MP 0-66 70   PIP 20-90 104   DIP 0-42 38   TAM 178 212     Single Digit ROM CHART as measured in degrees  Digit  A/P 08/11/2020   Right MF   MP 0-86   PIP 8-92   DIP 0-50   TAM 220     Single Digit ROM CHART as measured in degrees  Digit  A/P 08/18/2020  Right MF 08/18/2020   Right IF    MP 0-90 0-84    PIP 0-100 0-108    DIP 0-65 0-60    TAM 255 252        Pain Level (0-10 scale) post treatment: 0/10    ASSESSMENT/Changes in Function: Pt demonstrates reduced edema in right hand long finger and full TAM of the right hand digits.  She reports return to use of right hand for daily tasks without increased pain or discomfort.  HEP and edema mgmt strategies reviewed with patient and patient  agrees to d/c at this time.      []   See Plan of Care  []   See progress note/recertification  [x]   See Discharge Summary         Progress towards goals / Updated goals:  Short Term Goals: To be accomplished in 2 weeks:  Goal:* Patient will be compliant with initial home exercise program to take an active role in their rehabilitation process.  Status at Eval: Patient was provided with a basic home exercise program including tendon glides and digit blocking exercises.  08/04/20: min cues for proper positioning with tendon glides and blocking exercises.  08/11/2020- pt reports and demonstrates good compliance with HEP, goal met    Goal:* Patient will demonstrate a good understanding of their condition and strategies for self-management.  Status at Eval: pt educated on prognosis, diagnosis, treatment plan, activity modifications, desensitization program, scar mgmt  08/04/20: reviewed scar management and edema management strategies.  08/11/2020- pt reports compliance with strategies and demonstrates reduced edema in the right hand middle finger, goal met    Long Term Goals:To be accomplished in 4 weeks:  Goal:*Patient will regain 220 degrees total arc of motion of the right index finger to enable grasp of cylindrical objects such as a glass, handle or toothbrush.  Status at Eval: 212 deg TAM  Status  at d/c 08/18/2020 - 252 deg TAM right IF, goal met    Goal:*Patient will regain 220 degrees total arc of motion of the right long finger to enable grasp of cylindrical objects such as a glass, handle or toothbrush.  Status at Eval: 178 deg  08/11/2020- 220 deg TAM of right MF, goal met    Goal:*Patient will attain 5 degrees or less of right long finger digital extension to enable her to reach into pocket.  Status at Eval: 20 deg extension lag    Goal:* Pt will demonstrate 0.3 cm decrease is right long finger PIP jt in order to improve ability to make a composite fist using right hand.  Status at eval: Right  long finger PIP jt circum 6.9 cm  08/04/20: 6.5cmPIP jt circumright long finger  08/06/20: 6.4cm PIP jt circum right long finger.  08/11/2020- 5.5 cm PIP jt circum right long finger, goal met    Goal:* Patient will show a 15 point improvement on FOTO functional status measure to improve overall functional performance.  Status at Eval: 63  Status at d/c 08/18/2020 - 65, goal met    PLAN  []   Upgrade activities as tolerated     []   Continue plan of care  []   Update interventions per flow sheet       [x]   Discharge due to:_goals met  []   Other:_      Aleen Campi, OT 08/18/2020  12:22 PM    Future Appointments   Date Time Provider Department Center   10/01/2020 11:45 AM [name], MD [facility]

## 2020-08-18 NOTE — Progress Notes (Signed)
 [facility]  [address]  [place], [place] [address]  [phone]  [phone] fax    Occupational Therapy Discharge Summary    Patient name: Mary [name] Start of Care: 07/24/2020   Referral source: Tanda Carne A, DO DOB: April 09, 1966   Medical/Treatment Diagnosis: Pain in left hand [M79.642]  Other specified soft tissue disorders [M79.89]  Other specified postprocedural states [Z98.890]  Payor: [place] MEDICARE / Plan: VA MEDICARE PART A & B / Product Type: Medicare /  Onset Date:February 2022     Prior Hospitalization: see medical history Provider#: [id number]   Medications: Verified on Patient Summary List     Comorbidities: Heart disease, fibromyalgia, depression, osteoporosis, diabetes, arthritis, HTN, visual impairment, hearing impaired, tobacco use, asthma, scoliosis, COPD, h/o stroke, bipolar disorder   Prior Level of Function: (I) with ADL/IADL tasks without fucntional limitations and pain using dominant right hand   Visits from Start of Care: 6    Missed Visits: 2  Reporting Period : 07/24/2020 to 08/18/2020     Summary of Care:  Short Term Goals: To be accomplished in 2 weeks:  Goal:* Patient will be compliant with initial home exercise program to take an active role in their rehabilitation process.  Status at Eval: Patient was provided with a basic home exercise program including tendon glides and digit blocking exercises.  08/04/20: min cues for proper positioning with tendon glides and blocking exercises.  08/11/2020- pt reports and demonstrates good compliance with HEP,goal met    Goal:* Patient will demonstrate a good understanding of their condition and strategies for self-management.  Status at Eval: pt educated on prognosis, diagnosis, treatment plan, activity modifications, desensitization program, scar mgmt  08/04/20: reviewed scar management and edema management strategies.  08/11/2020- pt reports compliance with strategies and demonstrates reduced edema  in the right hand middle finger, goal met    Long Term Goals:To be accomplished in 4 weeks:  Goal:*Patient will regain 220 degrees total arc of motion of the right index finger to enable grasp of cylindrical objects such as a glass, handle or toothbrush.  Status at Eval: 212 deg TAM  Status at d/c 08/18/2020 - 252 deg TAM right IF, goal met    Goal:*Patient will regain 220 degrees total arc of motion of the right long finger to enable grasp of cylindrical objects such as a glass, handle or toothbrush.  Status at Eval: 178 deg  08/11/2020- 220 deg TAM of right MF,goal met    Goal:*Patient will attain 5 degrees or less of right long finger digital extension to enable her to reach into pocket.  Status at Eval: 20 deg extension lag    Goal:* Pt will demonstrate 0.3 cm decrease is right long finger PIP jt in order to improve ability to make a composite fist using right hand.  Status at eval: Right long finger PIP jt circum 6.9 cm  08/04/20: 6.5cmPIP jt circumright long finger  08/06/20: 6.4cm PIP jt circum right long finger.  08/11/2020- 5.5 cm PIP jt circum right long finger,goal met    Goal:* Patient will show a 15 point improvement on FOTO functional status measure to improve overall functional performance.  Status at Eval: 43  Status at d/c 08/18/2020 - 65, goal met    ASSESSMENT/RECOMMENDATIONS: Pt demonstrates reduced edema in right hand long finger and full TAM of the right hand digits.  She reports return to use of right hand for daily tasks without increased pain or  discomfort.  HEP and edema mgmt strategies reviewed with patient and patient agrees to d/c at this time.      [x] Discontinue therapy: [x] Patient has reached or is progressing toward set goals      [] Patient is non-compliant or has abdicated      [] Due to lack of appreciable progress towards set goals    Powell Getting, OT 08/18/2020 12:50 PM    NOTE TO PHYSICIAN:  Please complete the following and fax to:   [facility] at [phone]  . Retain this original for your records.  If you are unable to process this request in   24 hours, please contact our office.     []  I have read the above report and request that my patient continue therapy with the following changes/special instructions:  [x]  I have read the above report and request that my patient be discharged from therapy    Physician's Signature:____________Date:_________TIME:________     Tanda Elouise LABOR, DO  ** Signature, Date and Time must be completed for valid certification **

## 2020-08-20 ENCOUNTER — Encounter

## 2020-08-24 NOTE — Telephone Encounter (Signed)
Patient called to let Dr.Arora know that she has finally been able to schedule the Bone Density Test.     Patient also wanted Dr.Arora to know that the Flexeril medication was making her sleepy so she was prescribed Zanaflex medication ,and that seems to be working.     Patient just wanted Dr. Wilford Corner to know this.     Patient aware Dr.Arora is out of the office this week. Patient did not request a call back.    Patient tel. [phone].

## 2020-08-26 NOTE — Progress Notes (Signed)
Returned missed call from patient. No answer. Left HIPPA compliant message. Name, role and contact information provided. Requested return call.

## 2020-08-31 ENCOUNTER — Inpatient Hospital Stay: Admit: 2020-08-31 | Payer: MEDICARE | Attending: Physical Medicine & Rehabilitation | Primary: Family Medicine

## 2020-08-31 DIAGNOSIS — Z13820 Encounter for screening for osteoporosis: Secondary | ICD-10-CM

## 2020-09-02 ENCOUNTER — Encounter

## 2020-09-02 ENCOUNTER — Ambulatory Visit: Attending: Physical Medicine & Rehabilitation | Primary: Family Medicine

## 2020-09-02 DIAGNOSIS — M810 Age-related osteoporosis without current pathological fracture: Secondary | ICD-10-CM

## 2020-09-03 NOTE — Telephone Encounter (Signed)
Patient called in regards to results summary via MyChart. 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    Phn: [phone]

## 2020-09-10 ENCOUNTER — Encounter

## 2020-09-10 MED ORDER — PREGABALIN 200 MG CAP
200 mg | ORAL_CAPSULE | Freq: Three times a day (TID) | ORAL | 2 refills | Status: DC
Start: 2020-09-10 — End: 2020-12-04

## 2020-09-29 ENCOUNTER — Emergency Department: Admit: 2020-09-30 | Payer: MEDICARE | Primary: Family Medicine

## 2020-09-29 DIAGNOSIS — M549 Dorsalgia, unspecified: Secondary | ICD-10-CM

## 2020-09-29 NOTE — ED Notes (Signed)
Patient escorted to Radiology for X-RAY

## 2020-09-29 NOTE — ED Notes (Signed)
Patient to ED with complaint of back pain, denies injury but unsure of if she moved wrong and pain worsened

## 2020-09-29 NOTE — ED Provider Notes (Signed)
ED Provider Notes by Linzie Collin, MD at 09/29/20 2206                Author: Linzie Collin, MD  Service: EMERGENCY  Author Type: Resident       Filed: 09/30/20 0434  Date of Service: 09/29/20 2206  Status: Attested           Editor: Linzie Collin, MD (Resident)  Cosigner: Wendie Chess, MD at 09/30/20 [phone]          Attestation signed by Wendie Chess, MD at 09/30/20 [phone]          Patient presenting with musculoskeletal back pain.  No red flags on history or physical.  X-rays similar to prior with chronic changes per my interpretations.   Treated with Toradol and Valium with significant improvement in her symptoms.  Discharged with care instructions and return precautions.      I have reviewed the chart and agree with the documentation recorded by the resident, including the assessment, treatment plan, and disposition. I performed a history and physical examination of the patient and discussed the management with the resident.          Wendie Chess, MD                                    EMERGENCY DEPARTMENT HISTORY AND PHYSICAL EXAM      10:06 PM         Date: 09/29/2020   Patient Name: Mary [name]        History of Presenting Illness          Chief Complaint       Patient presents with        ?  Back Pain           History Provided By: Patient   Location/Duration/Severity/Modifying factors             PCP: Caryl Ada, MD       Ms. Myricks is a 55yo caucasian F with history of chronic pain syndrome, compression fracture T10, hx of Kyphoplasty, Covid, upper extremity DVT,  PTSD, COPD, CHF, aortic valve replacement, bipolar ds, presents to the ER w/ 4d history of sharp mid back  pain and L leg radiculopathy. Reports she may have bent or twisted a certain way that made the pain come on acutely. Denies any red flags of saddle anesthesia, bowel or urinary incontinence, denies any falls, fever, chills or infection. Reports having  3 cervical surgeries in the past and having history of compression  fracture. Underwent Dexa scan last month that revealed Osteoporosis. Have been taking Ibuprofen, flexeril and tylenol but continued to limitation 2/2 pain that promped her to come to the  ER. She was supposed to see Dr. Wilford Corner, her spine doctor later this week 10/01/20 in the o/p setting.       Denies any nausea or vomiting and uses cane at times to ambulate.         Current Outpatient Medications          Medication  Sig  Dispense  Refill           ?  pregabalin (LYRICA) 200 mg capsule  Take 1 Capsule by mouth three (3) times daily.  90 Capsule  2           ?  methylPREDNISolone (Medrol, Pak,) 4 mg tablet  Per dose  pack instructions  1 Dose Pack  0     ?  tiZANidine (ZANAFLEX) 2 mg tablet  Take 1 Tablet by mouth three (3) times daily as needed for Muscle Spasm(s).  90 Tablet  0     ?  lidocaine (Lidoderm) 5 %  Apply patch to the affected area for 12 hours a day and remove for 12 hours a day. (Patient not taking: Reported on 08/12/2020)  30 Each  0     ?  aspirin 81 mg chewable tablet  Take 81 mg by mouth daily.         ?  furosemide (Lasix) 40 mg tablet  Take 40 mg by mouth daily as needed (swelling).         ?  cyclobenzaprine (FLEXERIL) 5 mg tablet  Take 1 Tablet by mouth nightly as needed for Muscle Spasm(s).  30 Tablet  1     ?  ipratropium-albuteroL (Combivent Respimat) 20-100 mcg/actuation inhaler  Take 1 Puff by inhalation every six (6) hours.  4 g  3     ?  fluticasone propionate (FLONASE) 50 mcg/actuation nasal spray  1 spray by nasal route daily  1 Each  2     ?  hydrOXYzine HCL (ATARAX) 25 mg tablet  Take 25 mg by mouth two (2) times daily as needed for Anxiety.         ?  albuterol (PROVENTIL HFA, VENTOLIN HFA, PROAIR HFA) 90 mcg/actuation inhaler  Take 2 Puffs by inhalation every six (6) hours as needed.         ?  cetirizine (ZYRTEC) 10 mg tablet  Take 10 mg by mouth daily.         ?  lamoTRIgine (LaMICtal) 25 mg tablet  Take 75 mg by mouth daily.         ?  metoprolol succinate (TOPROL-XL) 25 mg XL  tablet  Take 25 mg by mouth daily.         ?  omeprazole (PRILOSEC) 40 mg capsule  Take 40 mg by mouth daily.         ?  pravastatin (PRAVACHOL) 40 mg tablet  Take 40 mg by mouth nightly.                 ?  warfarin (COUMADIN) 2.5 mg tablet  2.5 mg. Takes 5mg  MWF and takes 3.75 mg Tue, Thrs, Sat, Sunday                 Past History        Past Medical History:     Past Medical History:        Diagnosis  Date         ?  Anxiety       ?  Bipolar 1 disorder (HCC)       ?  CHF (congestive heart failure) (HCC)       ?  COPD (chronic obstructive pulmonary disease) (HCC)       ?  COPD (chronic obstructive pulmonary disease) (HCC)       ?  H/O aortic valve replacement           ?  PTSD (post-traumatic stress disorder)             Past Surgical History:     Past Surgical History:         Procedure  Laterality  Date          ?  HAND/FINGER SURGERY UNLISTED  Right            I&D right middle finger          ?  HX HEART CATHETERIZATION         ?  HX HEART VALVE SURGERY              mitral valve replacement           Family History:     Family History         Problem  Relation  Age of Onset          ?  No Known Problems  Mother             Social History:     Social History          Tobacco Use         ?  Smoking status:  Current Every Day Smoker              Packs/day:  1.00         ?  Smokeless tobacco:  Never Used       Substance Use Topics         ?  Alcohol use:  Never     ?  Drug use:  Yes              Types:  Marijuana           Allergies:     Allergies        Allergen  Reactions         ?  Adhesive  Rash         ?  Lithium  Nausea and Vomiting                Review of Systems        Review of Systems    Constitutional: Negative for chills and fever.    HENT: Negative for congestion and sore throat.     Eyes: Negative for blurred vision and discharge.    Respiratory: Negative for shortness of breath and wheezing.     Cardiovascular: Negative for chest pain, palpitations and leg swelling.    Gastrointestinal: Negative for  nausea and vomiting.    Genitourinary: Negative for dysuria and urgency.    Musculoskeletal: Positive for back pain.    Skin: Negative for rash.    Neurological: Negative for dizziness, sensory change and focal weakness.    Psychiatric/Behavioral: Positive for depression.                Physical Exam        Visit Vitals      BP  115/86 (BP 1 Location: Right upper arm, BP Patient Position: At rest)     Pulse  72     Temp  98.8 ??F (37.1 ??C)     Resp  17        SpO2  92%           Physical Exam   Constitutional :        Comments: Anxious appearing and tearful at times during conversation.    HENT:       Head: Normocephalic and atraumatic.      Mouth/Throat:      Mouth: Mucous membranes are dry.   Eyes:       Extraocular Movements: Extraocular movements intact.      Pupils: Pupils are equal, round, and reactive to light.   Cardiovascular:  Rate and Rhythm: Normal rate and regular rhythm.      Pulses: Normal pulses.      Heart sounds: Normal heart sounds.    Pulmonary:       Effort: Pulmonary effort is normal.      Breath sounds: Normal breath sounds.   Abdominal :      General: Bowel sounds are normal.      Palpations: Abdomen is soft.     Musculoskeletal:          General: Normal range of motion.      Cervical back: Normal range of motion and neck supple.      Comments: Mild tenderness on diffuse palpation of back. Moderate tenderness on palpation  of left paraspinal T4-T6 region without any evidence of erythema, edema, bruising or spinal step offs.    Skin:      General: Skin is warm and dry.   Neurological :       General: No focal deficit present.      Mental Status: She is alert and oriented to person, place, and time.      Cranial Nerves: No cranial nerve deficit.      Sensory: No sensory deficit.      Motor: No weakness.      Coordination:  Coordination normal.               Diagnostic Study Results        Labs -   No results found for this or any previous visit (from the past 12 hour(s)).      Radiologic  Studies -      No orders to display             Medical Decision Making     I am the first provider for this patient.      I reviewed the vital signs, available nursing notes, past medical history, past surgical history, family history and social history.      Vital Signs-Reviewed the patient's vital signs.         EKG: pending       Records Reviewed: Old Medical Records  (Time of Review: 10:06 PM)      ED Course: Progress Notes, Reevaluation, and Consults:        Ms. Magallanes is a 55yo F with hx of multiple comorbidities presenting with acute back pain. VS stable. Physical exam pertinent  for tenderness on palpation of L paraspinal region of T4-T5 vertebrae. No spinal step offs or evidence of acute bruising or deformities. Patient's pain treated with Ketorolac  and lidocaine patch. Wet read of Xray thoraco-lumbar spine reviewed alongside  my attending Dr. Elmer Sow did not reveal any acute fractures or deformities. Patient's pain improved after about 2 hours and she became very  anxious to smoke a cigarette and go home. Given overall stable status of patient, no acute findings on Xray, symptomatic  improvement and upcoming appointment with her Spine physician Dr. Wilford Corner in 1 day felt comfortable discharging her with strict return precautions in case her symptoms acutely worsened. Discussed using Lidocaine patch for pain PRN and following up with  PCP in 1-2 days for ER visit follow up.        Provider Notes (Medical Decision Making):    MDM      Procedures      Critical Care Time:            Diagnosis        Clinical Impression:  Musculoskeletal Back Pain       Disposition: Home         Follow-up Information      None                   Patient's Medications       Start Taking          No medications on file       Continue Taking           ALBUTEROL (PROVENTIL HFA, VENTOLIN HFA, PROAIR HFA) 90 MCG/ACTUATION INHALER     Take 2 Puffs by inhalation every six (6) hours as needed.           ASPIRIN 81 MG CHEWABLE TABLET      Take 81 mg by mouth daily.           CETIRIZINE (ZYRTEC) 10 MG TABLET     Take 10 mg by mouth daily.           CYCLOBENZAPRINE (FLEXERIL) 5 MG TABLET     Take 1 Tablet by mouth nightly as needed for Muscle Spasm(s).           FLUTICASONE PROPIONATE (FLONASE) 50 MCG/ACTUATION NASAL SPRAY     1 spray by nasal route daily           FUROSEMIDE (LASIX) 40 MG TABLET     Take 40 mg by mouth daily as needed (swelling).           HYDROXYZINE HCL (ATARAX) 25 MG TABLET     Take 25 mg by mouth two (2) times daily as needed for Anxiety.           IPRATROPIUM-ALBUTEROL (COMBIVENT RESPIMAT) 20-100 MCG/ACTUATION INHALER     Take 1 Puff by inhalation every six (6) hours.           LAMOTRIGINE (LAMICTAL) 25 MG TABLET     Take 75 mg by mouth daily.           LIDOCAINE (LIDODERM) 5 %     Apply patch to the affected area for 12 hours a day and remove for 12 hours a day.           METHYLPREDNISOLONE (MEDROL, PAK,) 4 MG TABLET     Per dose pack instructions           METOPROLOL SUCCINATE (TOPROL-XL) 25 MG XL TABLET     Take 25 mg by mouth daily.           OMEPRAZOLE (PRILOSEC) 40 MG CAPSULE     Take 40 mg by mouth daily.           PRAVASTATIN (PRAVACHOL) 40 MG TABLET     Take 40 mg by mouth nightly.             PREGABALIN (LYRICA) 200 MG CAPSULE     Take 1 Capsule by mouth three (3) times daily.           TIZANIDINE (ZANAFLEX) 2 MG TABLET     Take 1 Tablet by mouth three (3) times daily as needed for Muscle Spasm(s).           WARFARIN (COUMADIN) 2.5 MG TABLET     2.5 mg. Takes 5mg  MWF and takes 3.75 mg Tue, Thrs, Sat, Sunday       These Medications have changed          No medications on file       Stop Taking  No medications on file        Disclaimer: Sections of this note are dictated using utilizing voice recognition software.  Minor typographical errors may be present. If questions arise, please do not hesitate to contact me or call our department.

## 2020-09-30 ENCOUNTER — Inpatient Hospital Stay
Admit: 2020-09-30 | Discharge: 2020-09-30 | Disposition: A | Discharge status: Home / Self Care | Payer: MEDICARE | Attending: Emergency Medicine

## 2020-09-30 MED ORDER — HYDROXYZINE 25 MG TAB
25 mg | ORAL | Status: DC
Start: 2020-09-30 — End: 2020-09-30

## 2020-09-30 MED ORDER — MORPHINE 2 MG/ML INJECTION
2 mg/mL | Freq: Once | INTRAMUSCULAR | Status: DC
Start: 2020-09-30 — End: 2020-09-29

## 2020-09-30 MED ORDER — LIDOCAINE 4 % TOPICAL PATCH (12 HOUR DURATION)
4 % | CUTANEOUS | Status: DC
Start: 2020-09-30 — End: 2020-09-30

## 2020-09-30 MED ORDER — KETOROLAC TROMETHAMINE 15 MG/ML INJECTION
15 mg/mL | Freq: Once | INTRAMUSCULAR | Status: AC
Start: 2020-09-30 — End: 2020-09-29
  Administered 2020-09-30: 03:00:00 via INTRAVENOUS

## 2020-09-30 MED FILL — LIDOCAINE 4 % TOPICAL PATCH (12 HOUR DURATION): 4 % | CUTANEOUS | Qty: 1

## 2020-09-30 MED FILL — KETOROLAC TROMETHAMINE 15 MG/ML INJECTION: 15 mg/mL | INTRAMUSCULAR | Qty: 1

## 2020-09-30 MED FILL — HYDROXYZINE 25 MG TAB: 25 mg | ORAL | Qty: 1

## 2020-10-01 ENCOUNTER — Ambulatory Visit: Attending: Physical Medicine & Rehabilitation | Primary: Family Medicine

## 2020-10-01 ENCOUNTER — Ambulatory Visit
Admit: 2020-10-01 | Discharge: 2020-10-01 | Payer: MEDICARE | Attending: Physical Medicine & Rehabilitation | Primary: Family Medicine

## 2020-10-01 ENCOUNTER — Telehealth

## 2020-10-01 DIAGNOSIS — S22000S Wedge compression fracture of unspecified thoracic vertebra, sequela: Secondary | ICD-10-CM

## 2020-10-01 MED ORDER — TIZANIDINE 2 MG TAB
2 mg | ORAL_TABLET | Freq: Three times a day (TID) | ORAL | 0 refills | Status: DC | PRN
Start: 2020-10-01 — End: 2021-01-11

## 2020-10-01 MED ORDER — KETOROLAC TROMETHAMINE 30 MG/ML INJECTION
30 mg/mL (1 mL) | INTRAMUSCULAR | Status: AC
Start: 2020-10-01 — End: 2020-10-01
  Administered 2020-10-01: 17:00:00 via INTRAMUSCULAR

## 2020-10-01 NOTE — Telephone Encounter (Signed)
I will order a new thoracic MRI to r/o com fx.

## 2020-10-01 NOTE — Telephone Encounter (Signed)
Dr. Wilford Corner,     Looks like you just saw this patient.

## 2020-10-01 NOTE — Progress Notes (Signed)
Consent was explained to the pt and signed. No questions or concerns voiced at this time. Pt given 30mg/1ml of toradol IM in right gluteus. No sign or symptoms of infection noted at injection site. There was no bleeding, swelling or leaking noted after injection. Pt handed injection information sheet to take home. The pt tolerated the injection well and did not want to wait in the exam room for observation. She ambulated to check out with out any issues.

## 2020-10-01 NOTE — Telephone Encounter (Signed)
Patient called crying stating her whole back is in even more pain than before and wants to know if Dr. Wilford [name] can do a more detailed test.    Patient's contact# [phone]

## 2020-10-01 NOTE — Telephone Encounter (Signed)
Pt was contacted and notified that the provider will be ordering a thoracic MRI to r/o fracture. The pt uses [facility] and is aware that they will contact her to schedule this. She has the number in case she has not heard from them in a reasonable time. The pt was identified using 2 pt identifiers.

## 2020-10-02 ENCOUNTER — Telehealth

## 2020-10-02 ENCOUNTER — Encounter

## 2020-10-02 LAB — PT/INR EXTERNAL: INR, External: 1.7

## 2020-10-02 LAB — AMB PT/INR EXTERNAL: INR, External: 1.7 NA

## 2020-10-02 NOTE — Telephone Encounter (Signed)
Attempted to contact the pt about her message. She was not able to be reached. A message was left for the pt asking her to contact the office at her convenience. The number to the office was provided.

## 2020-10-02 NOTE — Telephone Encounter (Signed)
Patient called back from the call she missed from [place], but it's not notated of what the call was for or what to advise the patient once she calls back.

## 2020-10-02 NOTE — Telephone Encounter (Signed)
Patient called back again stating her MRI is scheduled for 10/26/2020 and is now asking if Dr. Wilford Corner can move that appointment up for her MRI date. I advised that if the scheduler called her to setup an appointment , that may be the earliest they have.    Patients contact# [phone]

## 2020-10-02 NOTE — Telephone Encounter (Signed)
She is allergic to valium and xanax (benzos).  They make her "suicidal".  What has she taken before that she can tolerated.

## 2020-10-02 NOTE — Telephone Encounter (Signed)
Patient called and said she was returning a call from Dr.Arora office.     Patient also said she is Claustrophobic and needs something to help her through her Mri.    [facility] on [address]   Tel. [phone]    Patient tel. [phone].    Note : please see previous messages.

## 2020-10-05 MED ORDER — TRAMADOL 50 MG TAB
50 mg | ORAL_TABLET | Freq: Four times a day (QID) | ORAL | 0 refills | Status: DC | PRN
Start: 2020-10-05 — End: 2020-11-06

## 2020-10-05 NOTE — Telephone Encounter (Signed)
Patient is still awaiting an answer if there is anything we can do to expedite her MRI.  She is also requesting pain medication to get her through until this can be done.  Please advise.     Patient [phone]

## 2020-10-05 NOTE — Telephone Encounter (Signed)
I called and spoke to the pt. The pt was identified using 2 pt identifiers. She states that she can take ativan and klonopin for the anxiety. These do not make her more depressed. Pt is alos begging to have something for pain. Her MRI is currently scheduled for 10/26/20 and she is on their cancellation list. Message routed to provider for review. Pt aware.

## 2020-10-05 NOTE — Telephone Encounter (Signed)
Yes

## 2020-10-05 NOTE — Telephone Encounter (Signed)
Since she may have a compression fracture, I will give her an acute rx of ultram.  Will give the ativan closer to the MRI.

## 2020-10-05 NOTE — Telephone Encounter (Signed)
When is her MRI scheduled?  She is allergic to valium and bnezos as they make her suicidal.  What calming med has she taken in the past for tests such as MRIs.?

## 2020-10-05 NOTE — Telephone Encounter (Signed)
The pt was contacted and notified of the tramadol that was sent to the pharmacy for her. The pt was identified using 2 pt identifiers. She was made aware that the medication for the MRI will not be able to be sent into the pharmacy until a closer time to her MRI. I instructed the pt to call the office about 5 days in advance of 06/13 to give [place] time to approve her medication. The pt was also instructed to call the office if she is able to get a cancellation appt for her MRI so that we can get the meds sent to the pharmacy as soon as possible. The pt verbalized understanding and has no questions or concerns at this time. She is aware of which pharmacy the tramadol was sent to.

## 2020-10-08 ENCOUNTER — Telehealth

## 2020-10-08 LAB — PT/INR EXTERNAL: INR, External: 1.9

## 2020-10-08 LAB — AMB PT/INR EXTERNAL: INR, External: 1.9 NA

## 2020-10-13 NOTE — Progress Notes (Signed)
The pt's order for her TLSO brace, last office note and demographics were faxed over to [facility]. She is our office brace rep. A fax confirmation was received. She should reach out to the pt regarding the order. I have also sent an email to her to make her aware that the order has been faxed over.

## 2020-10-19 ENCOUNTER — Telehealth

## 2020-10-19 LAB — PT/INR EXTERNAL: INR, External: 1.9

## 2020-10-19 LAB — AMB PT/INR EXTERNAL: INR, External: 1.9 NA

## 2020-10-22 ENCOUNTER — Emergency Department: Admit: 2020-10-22 | Payer: MEDICARE | Primary: Family Medicine

## 2020-10-22 ENCOUNTER — Inpatient Hospital Stay
Admit: 2020-10-22 | Discharge: 2020-10-22 | Disposition: A | Discharge status: Home / Self Care | Payer: MEDICARE | Attending: Student in an Organized Health Care Education/Training Program

## 2020-10-22 DIAGNOSIS — J189 Pneumonia, unspecified organism: Secondary | ICD-10-CM

## 2020-10-22 LAB — CBC WITH AUTOMATED DIFF
ABS. BASOPHILS: 0 10*3/uL (ref 0.0–0.1)
ABS. EOSINOPHILS: 0 10*3/uL (ref 0.0–0.4)
ABS. IMM. GRANS.: 0 10*3/uL (ref 0.00–0.04)
ABS. LYMPHOCYTES: 0.8 10*3/uL — ABNORMAL LOW (ref 0.9–3.6)
ABS. MONOCYTES: 0.3 10*3/uL (ref 0.05–1.2)
ABS. NEUTROPHILS: 2.1 10*3/uL (ref 1.8–8.0)
ABSOLUTE NRBC: 0 10*3/uL (ref 0.00–0.01)
BASOPHILS: 1 % (ref 0–2)
EOSINOPHILS: 0 % (ref 0–5)
HCT: 37.2 % (ref 35.0–45.0)
HGB: 11.7 g/dL — ABNORMAL LOW (ref 12.0–16.0)
IMMATURE GRANULOCYTES: 0 % (ref 0.0–0.5)
LYMPHOCYTES: 24 % (ref 21–52)
MCH: 25.1 PG (ref 24.0–34.0)
MCHC: 31.5 g/dL (ref 31.0–37.0)
MCV: 79.7 FL (ref 78.0–100.0)
MONOCYTES: 9 % (ref 3–10)
MPV: 9.8 FL (ref 9.2–11.8)
NEUTROPHILS: 67 % (ref 40–73)
NRBC: 0 PER 100 WBC
PLATELET: 218 10*3/uL (ref 135–420)
RBC: 4.67 M/uL (ref 4.20–5.30)
RDW: 20.8 % — ABNORMAL HIGH (ref 11.6–14.5)
WBC: 3.2 10*3/uL — ABNORMAL LOW (ref 4.6–13.2)

## 2020-10-22 LAB — EKG, 12 LEAD, INITIAL
Atrial Rate: 77 {beats}/min
Calculated P Axis: 76 degrees
Calculated R Axis: 33 degrees
Calculated T Axis: 80 degrees
Diagnosis: NORMAL
P-R Interval: 166 ms
Q-T Interval: 370 ms
QRS Duration: 80 ms
QTC Calculation (Bezet): 418 ms
Ventricular Rate: 77 {beats}/min

## 2020-10-22 LAB — URINE MICROSCOPIC ONLY
RBC, UA: NEGATIVE /hpf (ref 0–5)
RBC: NEGATIVE /hpf (ref 0–5)
WBC, UA: 0 /hpf (ref 0–4)
WBC: 0 /hpf (ref 0–4)

## 2020-10-22 LAB — METABOLIC PANEL, COMPREHENSIVE
A-G Ratio: 1.1 (ref 0.8–1.7)
ALT (SGPT): 19 U/L (ref 13–56)
AST (SGOT): 20 U/L (ref 10–38)
Albumin: 3.5 g/dL (ref 3.4–5.0)
Alk. phosphatase: 61 U/L (ref 45–117)
Anion gap: 3 mmol/L (ref 3.0–18)
BUN/Creatinine ratio: 10 — ABNORMAL LOW (ref 12–20)
BUN: 7 MG/DL (ref 7.0–18)
Bilirubin, total: 0.6 MG/DL (ref 0.2–1.0)
CO2: 29 mmol/L (ref 21–32)
Calcium: 8.9 MG/DL (ref 8.5–10.1)
Chloride: 109 mmol/L (ref 100–111)
Creatinine: 0.67 MG/DL (ref 0.6–1.3)
GFR est AA: >60 mL/min/{1.73_m2} (ref >60)
GFR est non-AA: >60 mL/min/{1.73_m2} (ref >60)
Globulin: 3.3 g/dL (ref 2.0–4.0)
Glucose: 86 mg/dL (ref 74–99)
Potassium: 3.8 mmol/L (ref 3.5–5.5)
Protein, total: 6.8 g/dL (ref 6.4–8.2)
Sodium: 141 mmol/L (ref 136–145)

## 2020-10-22 LAB — URINALYSIS W/ RFLX MICROSCOPIC
Bilirubin, Urine: NEGATIVE
Bilirubin: NEGATIVE
Blood, Urine: NEGATIVE
Blood: NEGATIVE
Glucose, Ur: NEGATIVE mg/dL
Glucose: NEGATIVE mg/dL
Ketone: NEGATIVE mg/dL
Ketones, Urine: NEGATIVE mg/dL
Leukocyte Esterase, Urine: NEGATIVE
Leukocyte Esterase: NEGATIVE
Nitrite, Urine: NEGATIVE
Nitrites: NEGATIVE
Protein, UA: 30 mg/dL — AB
Protein: 30 mg/dL — AB
Specific Gravity, UA: 1.024 (ref 1.005–1.030)
Specific gravity: 1.024 (ref 1.005–1.030)
Urobilinogen, UA, POCT: 1 EU/dL (ref 0.2–1.0)
Urobilinogen: 1 EU/dL (ref 0.2–1.0)
pH (UA): 6.5 (ref 5.0–8.0)
pH, UA: 6.5 (ref 5.0–8.0)

## 2020-10-22 LAB — COVID-19 WITH INFLUENZA A/B
Influenza A By PCR: NOT DETECTED
Influenza A by PCR: NOT DETECTED
Influenza B By PCR: NOT DETECTED
Influenza B by PCR: NOT DETECTED
SARS-CoV-2 by PCR: NOT DETECTED
SARS-CoV-2: NOT DETECTED

## 2020-10-22 LAB — PROTHROMBIN TIME + INR
INR: 1.5 — ABNORMAL HIGH (ref 0.8–1.2)
Prothrombin time: 18.3 s — ABNORMAL HIGH (ref 11.5–15.2)

## 2020-10-22 LAB — TROPONIN-HIGH SENSITIVITY: Troponin-High Sensitivity: 6 ng/L (ref 0–54)

## 2020-10-22 LAB — NT-PRO BNP: NT pro-BNP: 1053 PG/ML — ABNORMAL HIGH (ref 0–900)

## 2020-10-22 LAB — EKG 12-LEAD
Atrial Rate: 77 {beats}/min
Diagnosis: NORMAL
P Axis: 76 degrees
P-R Interval: 166 ms
Q-T Interval: 370 ms
QRS Duration: 80 ms
QTc Calculation (Bazett): 418 ms
R Axis: 33 degrees
T Axis: 80 degrees
Ventricular Rate: 77 {beats}/min

## 2020-10-22 LAB — CBC WITH AUTO DIFFERENTIAL
Basophils %: 1 % (ref 0–2)
Basophils Absolute: 0 10*3/uL (ref 0.0–0.1)
Eosinophils %: 0 % (ref 0–5)
Eosinophils Absolute: 0 10*3/uL (ref 0.0–0.4)
Granulocyte Absolute Count: 0 10*3/uL (ref 0.00–0.04)
Hematocrit: 37.2 % (ref 35.0–45.0)
Hemoglobin: 11.7 g/dL — ABNORMAL LOW (ref 12.0–16.0)
Immature Granulocytes %: 0 % (ref 0.0–0.5)
Lymphocytes %: 24 % (ref 21–52)
Lymphocytes Absolute: 0.8 10*3/uL — ABNORMAL LOW (ref 0.9–3.6)
MCH: 25.1 PG (ref 24.0–34.0)
MCHC: 31.5 g/dL (ref 31.0–37.0)
MCV: 79.7 FL (ref 78.0–100.0)
MPV: 9.8 FL (ref 9.2–11.8)
Monocytes %: 9 % (ref 3–10)
Monocytes Absolute: 0.3 10*3/uL (ref 0.05–1.2)
NRBC Absolute: 0 10*3/uL (ref 0.00–0.01)
Neutrophils %: 67 % (ref 40–73)
Neutrophils Absolute: 2.1 10*3/uL (ref 1.8–8.0)
Nucleated RBCs: 0 PER 100 WBC
Platelets: 218 10*3/uL (ref 135–420)
RBC: 4.67 M/uL (ref 4.20–5.30)
RDW: 20.8 % — ABNORMAL HIGH (ref 11.6–14.5)
WBC: 3.2 10*3/uL — ABNORMAL LOW (ref 4.6–13.2)

## 2020-10-22 LAB — COMPREHENSIVE METABOLIC PANEL
ALT: 19 U/L (ref 13–56)
AST: 20 U/L (ref 10–38)
Albumin/Globulin Ratio: 1.1 (ref 0.8–1.7)
Albumin: 3.5 g/dL (ref 3.4–5.0)
Alkaline Phosphatase: 61 U/L (ref 45–117)
Anion Gap: 3 mmol/L (ref 3.0–18)
BUN/Creatinine Ratio: 10 — ABNORMAL LOW (ref 12–20)
BUN: 7 MG/DL (ref 7.0–18)
CO2: 29 mmol/L (ref 21–32)
Calcium: 8.9 MG/DL (ref 8.5–10.1)
Chloride: 109 mmol/L (ref 100–111)
Creatinine: 0.67 MG/DL (ref 0.6–1.3)
GFR African American: >60 mL/min/{1.73_m2} (ref >60)
Globulin: 3.3 g/dL (ref 2.0–4.0)
Glucose: 86 mg/dL (ref 74–99)
Potassium: 3.8 mmol/L (ref 3.5–5.5)
Sodium: 141 mmol/L (ref 136–145)
Total Bilirubin: 0.6 MG/DL (ref 0.2–1.0)
Total Protein: 6.8 g/dL (ref 6.4–8.2)
eGFR NON-AA: >60 mL/min/{1.73_m2} (ref >60)

## 2020-10-22 LAB — TROPONIN, HIGH SENSITIVITY: Troponin, High Sensitivity: 6 ng/L (ref 0–54)

## 2020-10-22 LAB — PROTIME-INR
INR: 1.5 — ABNORMAL HIGH (ref 0.8–1.2)
Protime: 18.3 s — ABNORMAL HIGH (ref 11.5–15.2)

## 2020-10-22 LAB — PROBNP, N-TERMINAL: BNP: 1053 PG/ML — ABNORMAL HIGH (ref 0–900)

## 2020-10-22 MED ORDER — DOXYCYCLINE HYCLATE 100 MG TAB
100 mg | ORAL_TABLET | Freq: Two times a day (BID) | ORAL | 0 refills | Status: AC
Start: 2020-10-22 — End: 2020-10-29

## 2020-10-22 MED ORDER — FUROSEMIDE 10 MG/ML IJ SOLN
10 mg/mL | Freq: Once | INTRAMUSCULAR | Status: AC
Start: 2020-10-22 — End: 2020-10-22
  Administered 2020-10-22: 20:00:00 via INTRAVENOUS

## 2020-10-22 MED ORDER — ACETAMINOPHEN 325 MG TABLET
325 mg | ORAL | Status: AC
Start: 2020-10-22 — End: 2020-10-22
  Administered 2020-10-22: 20:00:00 via ORAL

## 2020-10-22 MED FILL — FUROSEMIDE 10 MG/ML IJ SOLN: 10 mg/mL | INTRAMUSCULAR | Qty: 4

## 2020-10-22 MED FILL — MAPAP (ACETAMINOPHEN) 325 MG TABLET: 325 mg | ORAL | Qty: 2

## 2020-10-22 NOTE — ED Notes (Signed)
Pt d/cd to home awake, alert and in NAD.  All questions answered.

## 2020-10-22 NOTE — ED Provider Notes (Signed)
EMERGENCY DEPARTMENT HISTORY AND PHYSICAL EXAM    1:43 PM      Date: 10/22/2020  Patient Name: Mary [name]    History of Presenting Illness     Chief Complaint   Patient presents with   ??? Shortness of Breath   ??? Fever         History Provided By: Patient  Location/Duration/Severity/Modifying factors   Patient is a 55 year old female with a history of COPD, anxiety, bipolar disorder, HF presented due to increasing shortness of breath.  Patient states that for the last 2 days she has been having muscle aches with chills and some worsening shortness of breath and fatigue.  Patient states that this morning when she woke up to have chills also took her temperature had a temperature of 101.  Due to the symptoms and the elevated temperature she presented to the emergency department.  Patient states that she took Tylenol roughly around 10 AM this morning so likely why she does not have a temp in the emergency department today. Patient also she been having some increasing frequency and burning with urination so possibly has a urinary tract infection.  Patient was able to ambulate from the waiting room back to the room pretty far in the emergency department with minimal difficulty.  Patient is using portable oxygen which she says she uses when moving around.          PCP: Caryl Ada, MD    Current Outpatient Medications   Medication Sig Dispense Refill   ??? cyanocobalamin 1,000 mcg tablet cyanocobalamin (vit B-12) 1,000 mcg tablet   take 1 tablet by mouth daily as directed     ??? docusate sodium (COLACE) 100 mg capsule Col-Rite 100 mg capsule   take 1 capsule by mouth twice a day if needed for constipation     ??? FeroSuL 325 mg (65 mg iron) tablet take 1 tablet by mouth every other day with ORANGE JUICE AS TOLER...  (REFER TO PRESCRIPTION NOTES).     ??? multivit-min-iron fum-folic ac 7.5 mg iron-400 mcg tab      ??? minoxidiL (Rogaine) 2 % external solution Apply  to affected area two (2) times a day.     ??? tiZANidine  (ZANAFLEX) 2 mg tablet Take 1 Tablet by mouth three (3) times daily as needed for Muscle Spasm(s). 90 Tablet 0   ??? pregabalin (LYRICA) 200 mg capsule Take 1 Capsule by mouth three (3) times daily. 90 Capsule 2   ??? aspirin 81 mg chewable tablet Take 81 mg by mouth daily.     ??? furosemide (Lasix) 40 mg tablet Take 40 mg by mouth daily as needed (swelling).     ??? ipratropium-albuteroL (Combivent Respimat) 20-100 mcg/actuation inhaler Take 1 Puff by inhalation every six (6) hours. 4 g 3   ??? fluticasone propionate (FLONASE) 50 mcg/actuation nasal spray 1 spray by nasal route daily 1 Each 2   ??? hydrOXYzine HCL (ATARAX) 25 mg tablet Take 25 mg by mouth two (2) times daily as needed for Anxiety.     ??? albuterol (PROVENTIL HFA, VENTOLIN HFA, PROAIR HFA) 90 mcg/actuation inhaler Take 2 Puffs by inhalation every six (6) hours as needed.     ??? cetirizine (ZYRTEC) 10 mg tablet Take 10 mg by mouth daily.     ??? lamoTRIgine (LaMICtal) 25 mg tablet Take 75 mg by mouth daily.     ??? metoprolol succinate (TOPROL-XL) 25 mg XL tablet Take 25 mg by mouth daily.     ???  omeprazole (PRILOSEC) 40 mg capsule Take 40 mg by mouth daily.     ??? pravastatin (PRAVACHOL) 40 mg tablet Take 40 mg by mouth nightly.       ??? warfarin (COUMADIN) 2.5 mg tablet 2.5 mg. Takes 5mg  MWF and takes 3.75 mg Tue, Thrs, Sat, Sunday         Past History     Past Medical History:  Past Medical History:   Diagnosis Date   ??? Anxiety    ??? Bipolar 1 disorder (HCC)    ??? CHF (congestive heart failure) (HCC)    ??? COPD (chronic obstructive pulmonary disease) (HCC)    ??? COPD (chronic obstructive pulmonary disease) (HCC)    ??? H/O aortic valve replacement    ??? PTSD (post-traumatic stress disorder)        Past Surgical History:  Past Surgical History:   Procedure Laterality Date   ??? HAND/FINGER SURGERY UNLISTED Right     I&D right middle finger   ??? HX HEART CATHETERIZATION     ??? HX HEART VALVE SURGERY      mitral valve replacement       Family History:  Family History   Problem  Relation Age of Onset   ??? No Known Problems Mother        Social History:  Social History     Tobacco Use   ??? Smoking status: Current Every Day Smoker     Packs/day: 1.00   ??? Smokeless tobacco: Never Used   Substance Use Topics   ??? Alcohol use: Never   ??? Drug use: Yes     Types: Marijuana       Allergies:  Allergies   Allergen Reactions   ??? Adhesive Rash   ??? Lithium Nausea and Vomiting   ??? Valium [Diazepam] Other (comments)     Gets suicidal   ??? Xanax [Alprazolam] Other (comments)     Gets suicidal         Review of Systems       Review of Systems   Constitutional: Positive for chills and fever.   Respiratory: Positive for shortness of breath. Negative for chest tightness.    Cardiovascular: Negative for chest pain.   Gastrointestinal: Negative for nausea and vomiting.   Genitourinary: Positive for dysuria. Negative for difficulty urinating.   Musculoskeletal: Positive for myalgias.   Skin: Negative.    Neurological: Negative for dizziness, weakness and light-headedness.         Physical Exam     Visit Vitals  BP 136/86   Pulse 77   Temp 100.3 ??F (37.9 ??C)   Resp 24   Ht 5\' 3"  (1.6 m)   Wt 91.2 kg (201 lb)   SpO2 99%   BMI 35.61 kg/m??         Physical Exam  Vitals and nursing note reviewed.   Constitutional:       General: She is not in acute distress.     Appearance: She is well-developed.   HENT:      Head: Normocephalic and atraumatic.   Eyes:      Extraocular Movements: Extraocular movements intact.   Cardiovascular:      Rate and Rhythm: Normal rate and regular rhythm.      Heart sounds: Normal heart sounds. No murmur heard.  No gallop.    Pulmonary:      Effort: Pulmonary effort is normal.      Breath sounds: Normal breath sounds. No decreased breath sounds, wheezing,  rhonchi or rales.   Chest:      Chest wall: No tenderness.   Abdominal:      General: There is no distension.      Hernia: No hernia is present.   Musculoskeletal:         General: Normal range of motion.      Cervical back: Normal range of  motion.      Right lower leg: No edema.      Left lower leg: No edema.   Skin:     General: Skin is warm and dry.   Neurological:      General: No focal deficit present.      Mental Status: She is alert and oriented to person, place, and time.      Motor: No weakness.   Psychiatric:         Behavior: Behavior normal.         Thought Content: Thought content normal.         Judgment: Judgment normal.           Diagnostic Study Results     Labs -  Recent Results (from the past 12 hour(s))   EKG, 12 LEAD, INITIAL    Collection Time: 10/22/20  1:10 PM   Result Value Ref Range    Ventricular Rate 77 BPM    Atrial Rate 77 BPM    P-R Interval 166 ms    QRS Duration 80 ms    Q-T Interval 370 ms    QTC Calculation (Bezet) 418 ms    Calculated P Axis 76 degrees    Calculated R Axis 33 degrees    Calculated T Axis 80 degrees    Diagnosis       Normal sinus rhythm  Nonspecific ST and T wave abnormality  Abnormal ECG  When compared with ECG of 05-Jul-2020 12:50,  No significant change was found         Radiologic Studies -   XR CHEST PA LAT    (Results Pending)         Medical Decision Making   I am the first provider for this patient.    I reviewed the vital signs, available nursing notes, past medical history, past surgical history, family history and social history.    Vital Signs-Reviewed the patient's vital signs.      EKG:     Records Reviewed: Nursing Notes (Time of Review: 1:43 PM)    ED Course: Progress Notes, Reevaluation, and Consults:         Provider Notes (Medical Decision Making):   MDM  Number of Diagnoses or Management Options  Diagnosis management comments: Patient is a 55 year old female with a history of COPD, anxiety, bipolar disorder, HF presented due to increasing shortness of breath.  Patient is presenting with possible viral infection with fever and myalgias concerning for COVID versus flu so we will do viral swab.  Differential includes CHF exacerbation, COPD exacerbation, ACS, arrhythmia, pneumonia.  I  discussed with CBC BMP BNP chest x-ray troponin EKG.  Patient's lung sounds are clear so do not believe the patient currently needs a albuterol treatment and less likely a COPD exacerbation.  Dispo pending labs and chest x-ray but overall patient appears fairly well so likely will end up discharging home.  If patient does come back and is COVID-positive then likely will give monoclonal antibodies due to high risk of complications with COVID.      Procedures    Critical Care  Time:       Diagnosis     Clinical Impression: No diagnosis found.    Disposition: Likely Home    Follow-up Information    None          Patient's Medications   Start Taking    No medications on file   Continue Taking    ALBUTEROL (PROVENTIL HFA, VENTOLIN HFA, PROAIR HFA) 90 MCG/ACTUATION INHALER    Take 2 Puffs by inhalation every six (6) hours as needed.    ASPIRIN 81 MG CHEWABLE TABLET    Take 81 mg by mouth daily.    CETIRIZINE (ZYRTEC) 10 MG TABLET    Take 10 mg by mouth daily.    CYANOCOBALAMIN 1,000 MCG TABLET    cyanocobalamin (vit B-12) 1,000 mcg tablet   take 1 tablet by mouth daily as directed    DOCUSATE SODIUM (COLACE) 100 MG CAPSULE    Col-Rite 100 mg capsule   take 1 capsule by mouth twice a day if needed for constipation    FEROSUL 325 MG (65 MG IRON) TABLET    take 1 tablet by mouth every other day with ORANGE JUICE AS TOLER...  (REFER TO PRESCRIPTION NOTES).    FLUTICASONE PROPIONATE (FLONASE) 50 MCG/ACTUATION NASAL SPRAY    1 spray by nasal route daily    FUROSEMIDE (LASIX) 40 MG TABLET    Take 40 mg by mouth daily as needed (swelling).    HYDROXYZINE HCL (ATARAX) 25 MG TABLET    Take 25 mg by mouth two (2) times daily as needed for Anxiety.    IPRATROPIUM-ALBUTEROL (COMBIVENT RESPIMAT) 20-100 MCG/ACTUATION INHALER    Take 1 Puff by inhalation every six (6) hours.    LAMOTRIGINE (LAMICTAL) 25 MG TABLET    Take 75 mg by mouth daily.    METOPROLOL SUCCINATE (TOPROL-XL) 25 MG XL TABLET    Take 25 mg by mouth daily.    MINOXIDIL  (ROGAINE) 2 % EXTERNAL SOLUTION    Apply  to affected area two (2) times a day.    MULTIVIT-MIN-IRON FUM-FOLIC AC 7.5 MG IRON-400 MCG TAB        OMEPRAZOLE (PRILOSEC) 40 MG CAPSULE    Take 40 mg by mouth daily.    PRAVASTATIN (PRAVACHOL) 40 MG TABLET    Take 40 mg by mouth nightly.      PREGABALIN (LYRICA) 200 MG CAPSULE    Take 1 Capsule by mouth three (3) times daily.    TIZANIDINE (ZANAFLEX) 2 MG TABLET    Take 1 Tablet by mouth three (3) times daily as needed for Muscle Spasm(s).    WARFARIN (COUMADIN) 2.5 MG TABLET    2.5 mg. Takes 5mg  MWF and takes 3.75 mg Tue, Thrs, Sat, Sunday   These Medications have changed    No medications on file   Stop Taking    No medications on file     Disclaimer: Sections of this note are dictated using utilizing voice recognition software.  Minor typographical errors may be present. If questions arise, please do not hesitate to contact me or call our department.

## 2020-10-22 NOTE — ED Notes (Unsigned)
I performed a brief evaluation, including history and physical, of the patient here in triage and I have determined that the patient will need further treatment and evaluation from the main side ER provider.  I have placed initial orders to help in expediting patients care.        Fever, cough, shortness of breath x3 days.  On home O2 via nasal cannula 2 L/min. On Coumadin for heart valve.     October 22, 2020 at 1:08 PM - Jacqulynn Cadet, FNP        Visit Vitals  BP 136/86   Pulse 77   Temp 100.3 ??F (37.9 ??C)   Resp 24   Ht 5\' 3"  (1.6 m)   Wt 91.2 kg (201 lb)   SpO2 99%   BMI 35.61 kg/m??

## 2020-10-22 NOTE — ED Notes (Signed)
 Pt reports shortness of breath and fever times 3 days. Pt states she uses oxygen has needed her oxygen often over the last 3 days.  Pt also reports lefts sided rib/flank pain and states I think something is wrong with my kidney.  Pt states she did a home covid test yesterday that was negative.

## 2020-10-26 ENCOUNTER — Ambulatory Visit: Payer: MEDICARE | Primary: Family Medicine

## 2020-10-28 ENCOUNTER — Telehealth

## 2020-10-28 LAB — PT/INR EXTERNAL: INR, External: 4.4

## 2020-10-28 LAB — AMB PT/INR EXTERNAL: INR, External: 4.4 NA

## 2020-11-02 ENCOUNTER — Telehealth

## 2020-11-02 NOTE — Telephone Encounter (Signed)
We usually give Valium. However, she has this listed as an allergy (suicidal thoughts.) Is she able to take a one-time dose before the MRI?

## 2020-11-02 NOTE — Telephone Encounter (Signed)
Patient called stating that her MRI was rescheduled to 11/09/20 due to an illness and that she needs a sedative sent to [facility] on [address]. Please advise patient when completed at [phone]

## 2020-11-03 MED ORDER — DIAZEPAM 10 MG TAB
10 mg | ORAL_TABLET | ORAL | 0 refills | Status: DC
Start: 2020-11-03 — End: 2020-12-15

## 2020-11-03 NOTE — Telephone Encounter (Signed)
E-scribed. Patient must have driver.

## 2020-11-03 NOTE — Telephone Encounter (Signed)
Called patient and used the two identifiers.  Patient communicated that she can take it one time. If she took it regular  Then she would be depressed. Please sent valium  To [facility] on [address].

## 2020-11-03 NOTE — Telephone Encounter (Signed)
Called patient and used the two identifiers. Communicated to patient that she can not drive with this medication. And medication was sent to pharmacy. No further action is needed

## 2020-11-05 ENCOUNTER — Telehealth

## 2020-11-05 LAB — PT/INR EXTERNAL: INR, External: 3

## 2020-11-05 LAB — AMB PT/INR EXTERNAL: INR, External: 3 NA

## 2020-11-06 ENCOUNTER — Telehealth

## 2020-11-06 MED ORDER — TRAMADOL 50 MG TAB
50 mg | ORAL_TABLET | Freq: Four times a day (QID) | ORAL | 0 refills | Status: AC | PRN
Start: 2020-11-06 — End: 2020-11-13

## 2020-11-06 NOTE — Telephone Encounter (Signed)
Patient called requesting for a refill on her prescription or just enough medication until her Monday appointment. Patient contact [phone]       traMADoL (Ultram) 50 mg tablet

## 2020-11-06 NOTE — Telephone Encounter (Signed)
Did she have the MRI done?  If so, where?  I do not see the results.

## 2020-11-06 NOTE — Telephone Encounter (Signed)
I sent in the ultram.  Have her make a MRI fu appt with MD

## 2020-11-06 NOTE — Telephone Encounter (Signed)
Appt made

## 2020-11-09 ENCOUNTER — Inpatient Hospital Stay: Admit: 2020-11-09 | Payer: MEDICARE | Attending: Physical Medicine & Rehabilitation | Primary: Family Medicine

## 2020-11-09 DIAGNOSIS — S22000S Wedge compression fracture of unspecified thoracic vertebra, sequela: Secondary | ICD-10-CM

## 2020-11-10 NOTE — Telephone Encounter (Signed)
Patient is requesting a call once her MRI results are in. She stated she is in a lot of pain and feels like she has a fracture.    Patient can be reached at [phone].

## 2020-11-10 NOTE — Telephone Encounter (Signed)
If she has a fracture on the MRI, then we will be calling her.

## 2020-11-10 NOTE — Telephone Encounter (Signed)
Pt was contacted and notified of the provider's message. The pt was identified using 2 pt identifiers. Her results are still in process from being done yesterday. The pt verbalized understanding and has no questions or concerns at this time.

## 2020-11-11 ENCOUNTER — Encounter

## 2020-11-18 ENCOUNTER — Encounter

## 2020-11-18 ENCOUNTER — Telehealth

## 2020-11-18 LAB — PT/INR EXTERNAL: INR, External: 2

## 2020-11-18 LAB — AMB PT/INR EXTERNAL: INR, External: 2 NA

## 2020-11-18 NOTE — Telephone Encounter (Signed)
 Last Visit: 10/01/20 with MD Voncile  Next Appointment: 01/11/21 with MD Voncile  Previous Refill Encounter(s): 11/06/20 #28    Requested Prescriptions     Pending Prescriptions Disp Refills   . traMADoL  (ULTRAM ) 50 mg tablet 28 Tablet 0     Sig: Take 1 Tablet by mouth every six (6) hours as needed for Pain for up to 7 days. Max Daily Amount: 200 mg. Indications: pain         For Pharmacy Admin Tracking Only    . CPA in place:   . Recommendation Provided To:   SABRA Intervention Detail: New Rx: 1, reason: Patient Preference  . Gap Closed?:   . Intervention Accepted By:   . Time Spent (min): 5

## 2020-11-24 ENCOUNTER — Inpatient Hospital Stay: Payer: MEDICARE | Attending: Family Medicine | Primary: Family Medicine

## 2020-11-26 ENCOUNTER — Ambulatory Visit: Attending: Specialist | Primary: Family Medicine

## 2020-11-26 ENCOUNTER — Telehealth

## 2020-11-26 LAB — PT/INR EXTERNAL: INR, External: 2.2

## 2020-11-26 LAB — AMB PT/INR EXTERNAL: INR, External: 2.2 NA

## 2020-12-03 LAB — PT/INR EXTERNAL: INR, External: 1.8

## 2020-12-03 LAB — AMB PT/INR EXTERNAL: INR, External: 1.8 NA

## 2020-12-04 ENCOUNTER — Telehealth

## 2020-12-04 MED ORDER — PREGABALIN 200 MG CAP
200 mg | ORAL_CAPSULE | Freq: Three times a day (TID) | ORAL | 1 refills | Status: DC
Start: 2020-12-04 — End: 2021-01-11

## 2020-12-04 NOTE — Telephone Encounter (Signed)
Patient called in requesting a refill for the following:    Pregabalin (LYRICA) 200 mg capsules      Confirmed Pharmacy:   [facility]   [address]  [place], [place] [address]      Patient advise callback when completed  [phone]

## 2020-12-04 NOTE — Telephone Encounter (Signed)
Patient is inquiring if we can consider increasing the strength of Lyrica from 200 mg or possibly prescribe an additional  medication as she needs to continue Lyrica for fibromyalgia.  Please advise patient as soon as possible.

## 2020-12-04 NOTE — Telephone Encounter (Signed)
Patient called in regards to this. Advised patient this message has been pended for review. Patient stated her pain is so bad she may have to go to the emergency room, however she does not want to because she's already had COVID once and she is high risk. She is requesting a call back as soon as possible and can be reached at [phone].

## 2020-12-04 NOTE — Telephone Encounter (Signed)
The pt was seen by Dr. Wilford Corner in May 2022. The last refill issued was in April 2022 with 2 refills. Her next follow up is scheduled for   01/11/21. Medication has been pended to a provider for review.

## 2020-12-08 NOTE — Telephone Encounter (Signed)
Can this pt be  added on this week?

## 2020-12-09 ENCOUNTER — Inpatient Hospital Stay
Admit: 2020-12-09 | Discharge: 2020-12-09 | Disposition: A | Discharge status: Home / Self Care | Payer: MEDICARE | Attending: Emergency Medicine

## 2020-12-09 ENCOUNTER — Emergency Department: Admit: 2020-12-09 | Payer: MEDICARE | Primary: Family Medicine

## 2020-12-09 DIAGNOSIS — U071 COVID-19: Secondary | ICD-10-CM

## 2020-12-09 LAB — URINE MICROSCOPIC ONLY
BACTERIA, URINE: NEGATIVE /hpf
Bacteria: NEGATIVE /hpf
RBC, UA: NEGATIVE /hpf (ref 0–5)
RBC: NEGATIVE /hpf (ref 0–5)
WBC, UA: 0 /hpf (ref 0–4)
WBC: 0 /hpf (ref 0–4)

## 2020-12-09 LAB — CBC WITH AUTOMATED DIFF
ABS. BASOPHILS: 0 10*3/uL (ref 0.0–0.1)
ABS. EOSINOPHILS: 0 10*3/uL (ref 0.0–0.4)
ABS. IMM. GRANS.: 0 10*3/uL (ref 0.00–0.04)
ABS. LYMPHOCYTES: 1 10*3/uL (ref 0.9–3.6)
ABS. MONOCYTES: 0.4 10*3/uL (ref 0.05–1.2)
ABS. NEUTROPHILS: 3.2 10*3/uL (ref 1.8–8.0)
ABSOLUTE NRBC: 0 10*3/uL (ref 0.00–0.01)
BASOPHILS: 1 % (ref 0–2)
EOSINOPHILS: 0 % (ref 0–5)
HCT: 38.5 % (ref 35.0–45.0)
HGB: 12.3 g/dL (ref 12.0–16.0)
IMMATURE GRANULOCYTES: 0 % (ref 0.0–0.5)
LYMPHOCYTES: 22 % (ref 21–52)
MCH: 26.8 PG (ref 24.0–34.0)
MCHC: 31.9 g/dL (ref 31.0–37.0)
MCV: 83.9 FL (ref 78.0–100.0)
MONOCYTES: 9 % (ref 3–10)
MPV: 9.8 FL (ref 9.2–11.8)
NEUTROPHILS: 68 % (ref 40–73)
NRBC: 0 PER 100 WBC
PLATELET: 191 10*3/uL (ref 135–420)
RBC: 4.59 M/uL (ref 4.20–5.30)
RDW: 17.8 % — ABNORMAL HIGH (ref 11.6–14.5)
WBC: 4.8 10*3/uL (ref 4.6–13.2)

## 2020-12-09 LAB — COVID-19 WITH INFLUENZA A/B
Influenza A By PCR: NOT DETECTED
Influenza A by PCR: NOT DETECTED
Influenza B By PCR: NOT DETECTED
Influenza B by PCR: NOT DETECTED
SARS-CoV-2 by PCR: DETECTED — CR
SARS-CoV-2: DETECTED — CR

## 2020-12-09 LAB — METABOLIC PANEL, COMPREHENSIVE
A-G Ratio: 1.1 (ref 0.8–1.7)
ALT (SGPT): 17 U/L (ref 13–56)
AST (SGOT): 19 U/L (ref 10–38)
Albumin: 3.5 g/dL (ref 3.4–5.0)
Alk. phosphatase: 56 U/L (ref 45–117)
Anion gap: 8 mmol/L (ref 3.0–18)
BUN/Creatinine ratio: 13 (ref 12–20)
BUN: 8 MG/DL (ref 7.0–18)
Bilirubin, total: 0.6 MG/DL (ref 0.2–1.0)
CO2: 25 mmol/L (ref 21–32)
Calcium: 8.5 MG/DL (ref 8.5–10.1)
Chloride: 109 mmol/L (ref 100–111)
Creatinine: 0.6 MG/DL (ref 0.6–1.3)
GFR est AA: >60 mL/min/{1.73_m2} (ref >60)
GFR est non-AA: >60 mL/min/{1.73_m2} (ref >60)
Globulin: 3.1 g/dL (ref 2.0–4.0)
Glucose: 85 mg/dL (ref 74–99)
Potassium: 3.4 mmol/L — ABNORMAL LOW (ref 3.5–5.5)
Protein, total: 6.6 g/dL (ref 6.4–8.2)
Sodium: 142 mmol/L (ref 136–145)

## 2020-12-09 LAB — MAGNESIUM
Magnesium: 2 mg/dL (ref 1.6–2.6)
Magnesium: 2 mg/dL (ref 1.6–2.6)

## 2020-12-09 LAB — EKG, 12 LEAD, INITIAL
Atrial Rate: 75 {beats}/min
Calculated R Axis: 42 degrees
Calculated T Axis: 77 degrees
Diagnosis: NORMAL
P-R Interval: 128 ms
Q-T Interval: 378 ms
QRS Duration: 90 ms
QTC Calculation (Bezet): 422 ms
Ventricular Rate: 75 {beats}/min

## 2020-12-09 LAB — PROTHROMBIN TIME + INR
INR: 2.2 — ABNORMAL HIGH (ref 0.8–1.2)
Prothrombin time: 24.8 s — ABNORMAL HIGH (ref 11.5–15.2)

## 2020-12-09 LAB — URINALYSIS W/ RFLX MICROSCOPIC
Bilirubin, Urine: NEGATIVE
Bilirubin: NEGATIVE
Blood, Urine: NEGATIVE
Blood: NEGATIVE
Glucose, Ur: NEGATIVE mg/dL
Glucose: NEGATIVE mg/dL
Ketone: 40 mg/dL — AB
Ketones, Urine: 40 mg/dL — AB
Leukocyte Esterase, Urine: NEGATIVE
Leukocyte Esterase: NEGATIVE
Nitrite, Urine: NEGATIVE
Nitrites: NEGATIVE
Specific Gravity, UA: 1.024 (ref 1.005–1.030)
Specific gravity: 1.024 (ref 1.005–1.030)
Urobilinogen, UA, POCT: 1 EU/dL (ref 0.2–1.0)
Urobilinogen: 1 EU/dL (ref 0.2–1.0)
pH (UA): 6 (ref 5.0–8.0)
pH, UA: 6 (ref 5.0–8.0)

## 2020-12-09 LAB — NT-PRO BNP: NT pro-BNP: 671 PG/ML (ref 0–900)

## 2020-12-09 LAB — TROPONIN-HIGH SENSITIVITY: Troponin-High Sensitivity: 8 ng/L (ref 0–54)

## 2020-12-09 LAB — CBC WITH AUTO DIFFERENTIAL
Basophils %: 1 % (ref 0–2)
Basophils Absolute: 0 10*3/uL (ref 0.0–0.1)
Eosinophils %: 0 % (ref 0–5)
Eosinophils Absolute: 0 10*3/uL (ref 0.0–0.4)
Granulocyte Absolute Count: 0 10*3/uL (ref 0.00–0.04)
Hematocrit: 38.5 % (ref 35.0–45.0)
Hemoglobin: 12.3 g/dL (ref 12.0–16.0)
Immature Granulocytes %: 0 % (ref 0.0–0.5)
Lymphocytes %: 22 % (ref 21–52)
Lymphocytes Absolute: 1 10*3/uL (ref 0.9–3.6)
MCH: 26.8 PG (ref 24.0–34.0)
MCHC: 31.9 g/dL (ref 31.0–37.0)
MCV: 83.9 FL (ref 78.0–100.0)
MPV: 9.8 FL (ref 9.2–11.8)
Monocytes %: 9 % (ref 3–10)
Monocytes Absolute: 0.4 10*3/uL (ref 0.05–1.2)
NRBC Absolute: 0 10*3/uL (ref 0.00–0.01)
Neutrophils %: 68 % (ref 40–73)
Neutrophils Absolute: 3.2 10*3/uL (ref 1.8–8.0)
Nucleated RBCs: 0 PER 100 WBC
Platelets: 191 10*3/uL (ref 135–420)
RBC: 4.59 M/uL (ref 4.20–5.30)
RDW: 17.8 % — ABNORMAL HIGH (ref 11.6–14.5)
WBC: 4.8 10*3/uL (ref 4.6–13.2)

## 2020-12-09 LAB — EKG 12-LEAD
Atrial Rate: 75 {beats}/min
Diagnosis: NORMAL
P-R Interval: 128 ms
Q-T Interval: 378 ms
QRS Duration: 90 ms
QTc Calculation (Bazett): 422 ms
R Axis: 42 degrees
T Axis: 77 degrees
Ventricular Rate: 75 {beats}/min

## 2020-12-09 LAB — COMPREHENSIVE METABOLIC PANEL
ALT: 17 U/L (ref 13–56)
AST: 19 U/L (ref 10–38)
Albumin/Globulin Ratio: 1.1 (ref 0.8–1.7)
Albumin: 3.5 g/dL (ref 3.4–5.0)
Alkaline Phosphatase: 56 U/L (ref 45–117)
Anion Gap: 8 mmol/L (ref 3.0–18)
BUN/Creatinine Ratio: 13 (ref 12–20)
BUN: 8 MG/DL (ref 7.0–18)
CO2: 25 mmol/L (ref 21–32)
Calcium: 8.5 MG/DL (ref 8.5–10.1)
Chloride: 109 mmol/L (ref 100–111)
Creatinine: 0.6 MG/DL (ref 0.6–1.3)
GFR African American: >60 mL/min/{1.73_m2} (ref >60)
Globulin: 3.1 g/dL (ref 2.0–4.0)
Glucose: 85 mg/dL (ref 74–99)
Potassium: 3.4 mmol/L — ABNORMAL LOW (ref 3.5–5.5)
Sodium: 142 mmol/L (ref 136–145)
Total Bilirubin: 0.6 MG/DL (ref 0.2–1.0)
Total Protein: 6.6 g/dL (ref 6.4–8.2)
eGFR NON-AA: >60 mL/min/{1.73_m2} (ref >60)

## 2020-12-09 LAB — PROTIME-INR
INR: 2.2 — ABNORMAL HIGH (ref 0.8–1.2)
Protime: 24.8 s — ABNORMAL HIGH (ref 11.5–15.2)

## 2020-12-09 LAB — PROBNP, N-TERMINAL: BNP: 671 PG/ML (ref 0–900)

## 2020-12-09 LAB — TROPONIN, HIGH SENSITIVITY: Troponin, High Sensitivity: 8 ng/L (ref 0–54)

## 2020-12-09 MED ORDER — PREGABALIN 75 MG CAP
75 mg | Freq: Once | ORAL | Status: AC
Start: 2020-12-09 — End: 2020-12-09
  Administered 2020-12-09: 21:00:00 via ORAL

## 2020-12-09 MED ORDER — SODIUM CHLORIDE 0.9 % IJ SYRG
Freq: Once | INTRAMUSCULAR | Status: AC
Start: 2020-12-09 — End: 2020-12-09
  Administered 2020-12-09: 18:00:00 via INTRAVENOUS

## 2020-12-09 MED ORDER — ACETAMINOPHEN 325 MG TABLET
325 mg | Freq: Once | ORAL | Status: AC
Start: 2020-12-09 — End: 2020-12-09
  Administered 2020-12-09: 19:00:00 via ORAL

## 2020-12-09 MED ORDER — BEBTELOVIMAB 175 MG/2 ML (87.5 MG/ML) INTRAVENOUS SOLUTION (UNAPP)
175287.5 mg/2 mL (87.5 mg/mL) | Freq: Once | INTRAVENOUS | Status: AC
Start: 2020-12-09 — End: 2020-12-09
  Administered 2020-12-09: 18:00:00 via INTRAVENOUS

## 2020-12-09 MED ORDER — ACETAMINOPHEN 500 MG TAB
500 mg | Freq: Once | ORAL | Status: AC
Start: 2020-12-09 — End: 2020-12-09
  Administered 2020-12-09: 14:00:00 via ORAL

## 2020-12-09 MED FILL — ACETAMINOPHEN 500 MG TAB: 500 mg | ORAL | Qty: 2

## 2020-12-09 MED FILL — PREGABALIN 50 MG CAP: 50 mg | ORAL | Qty: 1

## 2020-12-09 MED FILL — MAPAP (ACETAMINOPHEN) 325 MG TABLET: 325 mg | ORAL | Qty: 2

## 2020-12-09 MED FILL — BEBTELOVIMAB 175 MG/2 ML (87.5 MG/ML) INTRAVENOUS SOLUTION (UNAPP): 175 mg/2 mL (87.5 mg/mL) | INTRAVENOUS | Qty: 2

## 2020-12-09 MED FILL — BD POSIFLUSH NORMAL SALINE 0.9 % INJECTION SYRINGE: INTRAMUSCULAR | Qty: 40

## 2020-12-09 NOTE — ED Notes (Signed)
Pt arrived with c/o of fever, cough, and headache that started on Sunday.

## 2020-12-09 NOTE — ED Notes (Signed)
Discharge paperwork and instructions given to pt by Dr. Andrey Campanile.

## 2020-12-09 NOTE — ED Notes (Signed)
[facility] here to transport pt home.

## 2020-12-09 NOTE — ED Notes (Signed)
 Medication administered. Informed pt that we have to monitor for one hour after administration of bebtelovimab before we are able to discharge her. Pt requesting something to snack on and something to drink. Pt then stated she is feel nauseas. Requested sierra mist and saltines. Will continue to monitor pt for the allotted time.

## 2020-12-09 NOTE — Telephone Encounter (Signed)
I would think she would need an in office visit.   She usually wants a toradol injection.  If she is sick with other things like the flu then that may be why she is having increased pain.

## 2020-12-09 NOTE — ED Provider Notes (Signed)
ED Provider Notes by Wayland Salinas, MD at 12/09/20 [phone]                Author: Wayland Salinas, MD  Service: Emergency Medicine  Author Type: Resident       Filed: 12/09/20 2029  Date of Service: 12/09/20 0911  Status: Attested           Editor: Wayland Salinas, MD (Resident)  Cosigner: Freddie Apley, MD at 12/18/20 0040          Attestation signed by Freddie Apley, MD at 12/18/20 0040          I saw and evaluated the patient independently, in addition to the resident physician.     ED Course as of 12/18/20  0040      Wed Dec 09, 2020      1004  CXR overall appears improved from prior. No PTX, stable appearing cardiac silhouette and mediastinum, no focal infiltrate, no significant pleural  effusion [MW]            ED Course User Index   [MW] Wayland Salinas, MD            Freddie Apley, MD                                      EMERGENCY DEPARTMENT HISTORY AND PHYSICAL EXAM         Date: 12/09/2020   Patient Name: Mary [name]        History of Presenting Illness          Chief Complaint       Patient presents with        ?  Fever        ?  Cough           History Provided By: Patient      HPI: Mary [name], 55 y.o. female presents to the ED with cc of fever. Patient reports 3-4 days of fever, Tmax 103F, chills, intermittent headache  (pressure-like in quality, frontal in location, gradual in onset, nonradiaing, no associated vision change, weakness, numbness, seizure), shortness of breath, and chest tightness (improves with home albuterol). Cough started yesterday, nonproductive.  Patient reports being on 2L NC at home since December due to [place]. Patient also reports a few days of dysuria, no change in frequency (on diuretics). Patient reports taking a home covid test that was inconclusive.        PCP: Other, Phys, MD        No current facility-administered medications on file prior to encounter.          Current Outpatient Medications on File Prior to Encounter           Medication  Sig  Dispense  Refill           ?  pregabalin (LYRICA) 200 mg capsule  Take 1 Capsule by mouth three (3) times daily.  90 Capsule  1           ?  diazePAM (Valium) 10 mg tablet  Take 30-60 minutes po prior to procedure. Must have driver.  Indications: anxious  1 Tablet  0           ?  cyanocobalamin 1,000 mcg tablet  cyanocobalamin (vit B-12) 1,000 mcg tablet    take 1 tablet by mouth daily as directed         ?  docusate sodium (COLACE) 100 mg capsule  Col-Rite 100 mg capsule    take 1 capsule by mouth twice a day if needed for constipation         ?  FeroSuL 325 mg (65 mg iron) tablet  take 1 tablet by mouth every other day with ORANGE JUICE AS TOLER...  (REFER TO PRESCRIPTION NOTES).         ?  multivit-min-iron fum-folic ac 7.5 mg [id number]-308 mcg tab           ?  minoxidiL (Rogaine) 2 % external solution  Apply  to affected area two (2) times a day.         ?  tiZANidine (ZANAFLEX) 2 mg tablet  Take 1 Tablet by mouth three (3) times daily as needed for Muscle Spasm(s).  90 Tablet  0     ?  aspirin 81 mg chewable tablet  Take 81 mg by mouth daily.         ?  furosemide (Lasix) 40 mg tablet  Take 40 mg by mouth daily as needed (swelling).         ?  ipratropium-albuteroL (Combivent Respimat) 20-100 mcg/actuation inhaler  Take 1 Puff by inhalation every six (6) hours.  4 g  3     ?  fluticasone propionate (FLONASE) 50 mcg/actuation nasal spray  1 spray by nasal route daily  1 Each  2     ?  hydrOXYzine HCL (ATARAX) 25 mg tablet  Take 25 mg by mouth two (2) times daily as needed for Anxiety.         ?  albuterol (PROVENTIL HFA, VENTOLIN HFA, PROAIR HFA) 90 mcg/actuation inhaler  Take 2 Puffs by inhalation every six (6) hours as needed.         ?  cetirizine (ZYRTEC) 10 mg tablet  Take 10 mg by mouth daily.         ?  lamoTRIgine (LaMICtal) 25 mg tablet  Take 75 mg by mouth daily.         ?  metoprolol succinate (TOPROL-XL) 25 mg XL tablet  Take 25 mg by mouth daily.         ?  omeprazole (PRILOSEC) 40 mg  capsule  Take 40 mg by mouth daily.         ?  pravastatin (PRAVACHOL) 40 mg tablet  Take 40 mg by mouth nightly.                 ?  warfarin (COUMADIN) 2.5 mg tablet  2.5 mg. Takes 54m MWF and takes 3.75 mg Tue, Thrs, Sat, Sunday                 Past History        Past Medical History:     Past Medical History:        Diagnosis  Date         ?  Anxiety       ?  Bipolar 1 disorder ([facility]       ?  CHF (congestive heart failure) ([facility]       ?  COPD (chronic obstructive pulmonary disease) ([facility]       ?  COPD (chronic obstructive pulmonary disease) ([facility]       ?  H/O aortic valve replacement           ?  PTSD (post-traumatic stress disorder)  Past Surgical History:     Past Surgical History:         Procedure  Laterality  Date          ?  HAND/FINGER SURGERY UNLISTED  Right            I&D right middle finger          ?  HX HEART CATHETERIZATION         ?  HX HEART VALVE SURGERY              mitral valve replacement           Family History:     Family History         Problem  Relation  Age of Onset          ?  No Known Problems  Mother             Social History:     Social History          Tobacco Use         ?  Smoking status:  Every Day              Packs/day:  1.00         Types:  Cigarettes         ?  Smokeless tobacco:  Never       Substance Use Topics         ?  Alcohol use:  Never     ?  Drug use:  Yes              Types:  Marijuana           Allergies:     Allergies        Allergen  Reactions         ?  Adhesive  Rash     ?  Lithium  Nausea and Vomiting     ?  Valium [Diazepam]  Other (comments)             Gets suicidal         ?  Xanax [Alprazolam]  Other (comments)             Gets suicidal                Review of Systems     Review of Systems    Constitutional:  Positive for fatigue and fever .    HENT:  Positive for congestion. Negative for sore throat and trouble swallowing.     Eyes:  Negative for pain and visual disturbance.    Respiratory:  Positive for cough, chest tightness  and shortness  of breath.     Cardiovascular:  Negative for chest pain and leg swelling.    Gastrointestinal:  Negative for abdominal pain and vomiting.    Genitourinary:  Positive for dysuria. Negative for hematuria.    Musculoskeletal:  Negative for neck pain and neck stiffness.    Skin:  Negative for rash and wound.    Neurological:  Positive for headaches. Negative for syncope and speech difficulty.         Physical Exam     Physical Exam   Vitals and nursing note reviewed.    Constitutional:        General: She is not in acute distress.      Appearance: She is not ill-appearing.    HENT:       Head: Normocephalic  and atraumatic.       Nose: No congestion or rhinorrhea.       Mouth/Throat:       Mouth: Mucous membranes are moist.       Pharynx: Oropharynx is clear.    Eyes:       General: No scleral icterus.      Extraocular Movements: Extraocular movements intact.     Cardiovascular:       Rate and Rhythm: Normal rate and regular rhythm.       Pulses: Normal pulses.    Pulmonary:       Effort: Pulmonary effort is normal.       Breath sounds: No decreased air movement. Examination of the left-upper field reveals wheezing.  Wheezing present. No rhonchi or rales.     Abdominal:       General: There is no distension.       Palpations: Abdomen is soft.       Tenderness: There is no abdominal tenderness.     Musculoskeletal:       Cervical back: Neck supple. No rigidity.       Right lower leg: No edema.       Left lower leg: No edema.    Skin:      General: Skin is warm and dry.       Capillary Refill: Capillary refill takes less than 2 seconds.    Neurological:       General: No focal deficit present.       Mental Status: She is alert and oriented to person, place, and time.    Psychiatric:          Thought Content: Thought content normal.          Judgment: Judgment normal.            Diagnostic Study Results        Labs -         Recent Results (from the past 24 hour(s))     EKG, 12 LEAD, INITIAL          Collection Time:  12/09/20  9:28 AM         Result  Value  Ref Range            Ventricular Rate  75  BPM       Atrial Rate  75  BPM       P-R Interval  128  ms       QRS Duration  90  ms       Q-T Interval  378  ms       QTC Calculation (Bezet)  422  ms       Calculated R Axis  42  degrees       Calculated T Axis  77  degrees       Diagnosis                 Normal sinus rhythm   Left ventricular hypertrophy with repolarization abnormality ( Sokolow-Lyon )   Abnormal ECG   When compared with ECG of 22-Oct-2020 13:10,   No significant change was found   Confirmed by Toyah Pink, M.D., [place] [id number]) on 12/09/2020 11:28:19 AM          CBC WITH AUTOMATED DIFF          Collection Time: 12/09/20  9:54 AM         Result  Value  Ref Range  WBC  4.8  4.6 - 13.2 K/uL       RBC  4.59  4.20 - 5.30 M/uL       HGB  12.3  12.0 - 16.0 g/dL       HCT  38.5  35.0 - 45.0 %       MCV  83.9  78.0 - 100.0 FL       MCH  26.8  24.0 - 34.0 PG       MCHC  31.9  31.0 - 37.0 g/dL       RDW  17.8 (H)  11.6 - 14.5 %       PLATELET  191  135 - 420 K/uL       MPV  9.8  9.2 - 11.8 FL       NRBC  0.0  0 PER 100 WBC       ABSOLUTE NRBC  0.00  0.00 - 0.01 K/uL       NEUTROPHILS  68  40 - 73 %       LYMPHOCYTES  22  21 - 52 %       MONOCYTES  9  3 - 10 %       EOSINOPHILS  0  0 - 5 %       BASOPHILS  1  0 - 2 %       IMMATURE GRANULOCYTES  0  0.0 - 0.5 %       ABS. NEUTROPHILS  3.2  1.8 - 8.0 K/UL       ABS. LYMPHOCYTES  1.0  0.9 - 3.6 K/UL       ABS. MONOCYTES  0.4  0.05 - 1.2 K/UL       ABS. EOSINOPHILS  0.0  0.0 - 0.4 K/UL       ABS. BASOPHILS  0.0  0.0 - 0.1 K/UL       ABS. IMM. GRANS.  0.0  0.00 - 0.04 K/UL       DF  AUTOMATED          PROTHROMBIN TIME + INR          Collection Time: 12/09/20  9:54 AM         Result  Value  Ref Range            Prothrombin time  24.8 (H)  11.5 - 15.2 sec       INR  2.2 (H)  0.8 - 1.2         METABOLIC PANEL, COMPREHENSIVE          Collection Time: 12/09/20  9:54 AM         Result  Value  Ref Range            Sodium  142  136 -  145 mmol/L       Potassium  3.4 (L)  3.5 - 5.5 mmol/L       Chloride  109  100 - 111 mmol/L       CO2  25  21 - 32 mmol/L       Anion gap  8  3.0 - 18 mmol/L       Glucose  85  74 - 99 mg/dL       BUN  8  7.0 - 18 MG/DL       Creatinine  0.60  0.6 - 1.3 MG/DL       BUN/Creatinine ratio  13  12 - 20  GFR est AA  >60  >60 ml/min/1.69m       GFR est non-AA  >60  >60 ml/min/1.716m      Calcium  8.5  8.5 - 10.1 MG/DL       Bilirubin, total  0.6  0.2 - 1.0 MG/DL       ALT (SGPT)  17  13 - 56 U/L       AST (SGOT)  19  10 - 38 U/L       Alk. phosphatase  56  45 - 117 U/L       Protein, total  6.6  6.4 - 8.2 g/dL       Albumin  3.5  3.4 - 5.0 g/dL       Globulin  3.1  2.0 - 4.0 g/dL       A-G Ratio  1.1  0.8 - 1.7         NT-PRO BNP          Collection Time: 12/09/20  9:54 AM         Result  Value  Ref Range            NT pro-BNP  671  0 - 900 PG/ML       TROPONIN-HIGH SENSITIVITY          Collection Time: 12/09/20  9:54 AM         Result  Value  Ref Range            Troponin-High Sensitivity  8  0 - 54 ng/L       MAGNESIUM          Collection Time: 12/09/20  9:54 AM         Result  Value  Ref Range            Magnesium  2.0  1.6 - 2.6 mg/dL       COVID-19 WITH INFLUENZA A/B          Collection Time: 12/09/20  9:54 AM         Result  Value  Ref Range            SARS-CoV-2 by PCR  Detected (AA)  NOTD         Influenza A by PCR  Not detected  NOTD         Influenza B by PCR  Not detected  NOTD         URINALYSIS W/ RFLX MICROSCOPIC          Collection Time: 12/09/20  3:14 PM         Result  Value  Ref Range            Color  YELLOW          Appearance  CLEAR          Specific gravity  1.024  1.005 - 1.030         pH (UA)  6.0  5.0 - 8.0         Protein  TRACE (A)  NEG mg/dL       Glucose  Negative  NEG mg/dL       Ketone  40 (A)  NEG mg/dL       Bilirubin  Negative  NEG         Blood  Negative  NEG         Urobilinogen  1.0  0.2 - 1.0 EU/dL  Nitrites  Negative  NEG         Leukocyte Esterase  Negative  NEG          URINE MICROSCOPIC ONLY          Collection Time: 12/09/20  3:14 PM         Result  Value  Ref Range            WBC  0 to 1  0 - 4 /hpf       RBC  Negative  0 - 5 /hpf       Epithelial cells  FEW  0 - 5 /lpf            Bacteria  Negative  NEG /hpf           Radiologic Studies -      XR CHEST PORT       Final Result          Mild cardiomegaly. There are fibrotic changes in the lung bases possibly related     to prior COVID infection. There is a small pulmonary nodule in the right upper     lobe not significantly changed from 10/22/2020 which may be postinflammatory or     neoplastic. Consider repeat CT.                      CT Results  (Last 48 hours)             None                    CXR Results  (Last 48 hours)                                       12/09/20 0926    XR CHEST PORT  Final result            Impression:           Mild cardiomegaly. There are fibrotic changes in the lung bases possibly related      to prior COVID infection. There is a small pulmonary nodule in the right upper      lobe not significantly changed from 10/22/2020 which may be postinflammatory or      neoplastic. Consider repeat CT.                              Narrative:    CHEST PORTABLE             CPT CODE: 71245             COMPARISON: 10/22/2020             INDICATIONS: Fever cough and headache             FINDINGS: The heart appears to be slightly enlarged. Status post median      sternotomy. Atrial appendage clip present. There are fibrotic changes in the      lung bases. There is a small ill-defined pulmonary nodule in the right upper      lobe. No pleural effusions are seen. Surgical hardware is seen involving the      cervical spine.  Medical Decision Making     I am the first provider for this patient.      I reviewed the vital signs, available nursing notes, past medical history, past surgical history, family history and social history.      Vital Signs-Reviewed the patient's vital signs.    Patient Vitals for the past 24 hrs:            Temp  Pulse  Resp  BP  SpO2            12/09/20 1548  --  69  18  136/63  94 %            12/09/20 1533  --  --  --  --  93 %     12/09/20 1503  --  79  23  --  93 %     12/09/20 1448  --  79  14  126/69  96 %     12/09/20 1433  --  76  20  125/69  95 %     12/09/20 1418  --  74  20  121/68  93 %     12/09/20 1403  --  65  15  131/64  94 %     12/09/20 1348  --  75  21  125/64  94 %     12/09/20 1333  --  71  20  --  --     12/09/20 1318  --  71  19  139/69  93 %     12/09/20 1303  --  73  21  122/67  93 %     12/09/20 1248  --  --  --  105/71  --     12/09/20 1218  --  86  24  134/72  --     12/09/20 1133  --  82  18  136/69  --     12/09/20 1118  --  81  27  128/65  --     12/09/20 1103  --  75  20  134/71  --     12/09/20 1048  --  73  20  (!) 141/78  --     12/09/20 1033  --  70  20  (!) 144/69  --     12/09/20 1018  --  72  24  (!) 148/82  --     12/09/20 1003  --  75  19  132/82  --     12/09/20 0948  --  78  21  --  --     12/09/20 0933  --  79  23  134/71  97 %     12/09/20 0918  --  78  23  137/68  95 %     12/09/20 0909  99.3 ??F (37.4 ??C)  --  --  --  --     12/09/20 0903  --  --  --  --  96 %     12/09/20 0903  --  83  16  139/68  96 %            12/09/20 0902  --  83  16  139/68  95 %              Records Reviewed: Nursing Notes, Old Medical Records, Previous electrocardiograms, Previous Radiology Studies, and Previous Laboratory Studies      Provider Notes (Medical Decision Making)/ED course:    55 year old woman with  history of COPD/asthma, HFrEF (50%), s/p MV replacement (bioprosthetic, 5 years ago), afib on warfarin, bipolar, PTSD, tobacco use presents with fever, generalized fatigue, shortness  of breath, chest tightness and cough.       Initial assessment performed immediately upon arrival. The patients presenting problems have been discussed, and they are in agreement with the care plan formulated and outlined with them.  I have encouraged them to ask  questions as they arise throughout  their visit.      Differential includes, but not limited to, PNA, COVID/viral illness, myocarditis/pericarditis, COPD exacerbation, CHF, UTI/pyelo.       EKG, labs, and imaging personally interpreted by myself and Dr. Trudee Kuster as:      EKG interpretation: (Preliminary)   Rhythm: normal sinus rhythm; and regular . Rate (approx.): 75; Axis: normal; PR interval: normal; QRS interval: normal ; ST/T wave: normal; Other findings: very similar to prior.      Labs - COVID pos, no leukocytosis, normal Hb and platelets, INR 2.2, K 3.4, otherwise normal electrolytes and renal function. Pro BNP 671, trop 8, U/A without LE or nitrite.       Imaging - cardiac silhouette and mediastinum appear stable from prior, no pleural effusion, no PTX; on formal read small pul nodule not changed from prior, fibrotic changes at lung base      Patient has significant comorbidities that put her at high risk for serious COVID infection.    Discussed risks and benefits of bebtelovimab infusion and patient would like to move forward with the medication.          Disposition:   Home      Discussed findings with patient as they resulted. Recommended close primary care follow-up. Patient feels safe for discharge and I agree. Return precautions provided.       DISCHARGE PLAN:   1.      Discharge Medication List as of 12/09/2020  3:35 PM               2.      Follow-up Information                  Follow up With  Specialties  Details  Why  Contact Info              [facility] EMERGENCY DEPT  Emergency Medicine    As needed  [facility] [place]   [place]    Schedule an appointment as soon as possible for a visit     [place]   [phone]                  3.  Return to ED if worse: Please make sure to call and schedule an appointment with your primary care doctor as soon as possible to have further evaluation. If  you do not have a primary are doctor,  phone number provided.        Please return to the emergency room if you have any new or concerning symptoms, chest pain, difficulty breathing, weakness, numbness, severe headache, vision changes, persistent vomiting.       Please discuss the findings of your chest x-ray with your primary care doctor:      Report of CXR provided  Diagnosis        Clinical Impression:       1.  COVID            Attestations:      Wayland Salinas, MD      Please note that this dictation was completed with Dragon, the computer voice recognition software.  Quite often unanticipated grammatical, syntax, homophones, and other interpretive errors are inadvertently  transcribed by the computer software.  Please disregard these errors.  Please excuse any errors that have escaped final proofreading.  Thank you.

## 2020-12-10 NOTE — Telephone Encounter (Signed)
If she has covid then that may be contributing to her back pain as it can cause bad aches.  She can wait and see if she gets better once she recovers from covid or we can do a VV

## 2020-12-10 NOTE — Progress Notes (Signed)
Patient contacted regarding COVID-19 diagnosis, monoclonal antibody infusion follow up. Discussed COVID-19 related testing which was available at this time. Test results were positive. Patient informed of results, if available? yes.     Ambulatory Care Manager contacted the patient by telephone to perform post discharge assessment. Call within 2 business days of discharge: Yes Verified name and DOB with patient as identifiers. Provided introduction to self, and explanation of the CTN/ACM role, and reason for call due to risk factors for infection and/or exposure to COVID-19.     Symptoms reviewed with patient who verbalized the following symptoms: fever, cough, shortness of breath, chills or shaking, no new symptoms, and no worsening symptoms      Due to no new or worsening symptoms encounter was not routed to provider for escalation. Discussed follow-up appointments. If no appointment was previously scheduled, appointment scheduling offered:  no.  [facility] follow up appointment(s):   Future Appointments   Date Time Provider Department Center   12/22/2020 10:30 AM [facility] BS AMB   01/04/2021  9:30 AM Isabell Jarvis, MD [facility] BS AMB   01/11/2021  2:15 PM [name], MD [facility] BS AMB   03/31/2021 10:00 AM Venda Rodes, MD [facility] BS AMB     Non-[facility] follow up appointment(s): pt to call PCP for follow up    Interventions to address risk factors: Scheduled appointment with PCP-pt to call and Scheduled appointment with Specialist-has several appt's in August     Advance Care Planning:   Does patient have an Advance Directive: not on file.     Educated patient about risk for severe COVID-19 due to risk factors according to CDC guidelines. ACM reviewed discharge instructions, medical action plan and red flag symptoms with the patient who verbalized understanding. Discussed COVID vaccination status: yes. Education provided on COVID-19 vaccination as appropriate. Discussed exposure protocols and quarantine with CDC  Guidelines. Patient was given an opportunity to verbalize any questions and concerns and agrees to contact ACM or health care provider for questions related to their healthcare.    Reviewed and educated patient on any new and changed medications related to discharge diagnosis - no new medications    Was patient discharged with a pulse oximeter? no    ACM provided contact information. Plan for follow-up call in 5-7 days based on severity of symptoms and risk factors.

## 2020-12-14 NOTE — Telephone Encounter (Signed)
I called and spoke to the pt. The pt was identified using 2 pt identifiers. She was offered a telephone visit with the NP tomorrow afternoon at 1500. The pt accepted the appt and is aware of how this type of visit works. No questions or concerns were voiced at this time.

## 2020-12-15 ENCOUNTER — Telehealth: Admit: 2020-12-15 | Discharge: 2020-12-15 | Payer: MEDICARE | Attending: Nurse Practitioner | Primary: Family Medicine

## 2020-12-15 ENCOUNTER — Telehealth: Attending: Nurse Practitioner | Primary: Family Medicine

## 2020-12-15 DIAGNOSIS — G894 Chronic pain syndrome: Secondary | ICD-10-CM

## 2020-12-15 MED ORDER — TRAMADOL 50 MG TAB
50 mg | ORAL_TABLET | ORAL | 0 refills | Status: AC | PRN
Start: 2020-12-15 — End: 2020-12-22

## 2020-12-15 NOTE — Progress Notes (Signed)
History of Present Illness:    Consent: Mary [name], who was seen by telephone call and/or her healthcare decision maker, is aware that this patient-initiated, Telehealth encounter on 12/15/2020 is a billable service, with coverage as determined by her insurance carrier. She is aware that she may receive a bill and has provided verbal consent to proceed: Yes.  The provider was located at [facility] office  and the patient was located at their home .   No one else participated in the visit with the patient.  The platform used was telephone call.    The patient is a 55 year old female who has chronic pain.  She feels thoracic pain and did get the thoracic brace which has been helpful.  She states does seem to diminish the last 10 days she has had increased low back pain.  She has been taking Tylenol and ibuprofen without much relief.  She is on Lyrica 200 mg 3 times a day.  She is currently sick with COVID.  She did have an MRI of her thoracic spine which did not show any new compression fractures.  She states even when she takes the brace off it does not seem to help her low back pain.  She denies fever bowel bladder dysfunction.        Physical Exam: The patient is a 55 year old female who is alert and oriented.  She spoke with fluency.  She did not appear to be in distress.     Assessment & Plan: This is a patient who has chronic pain and has had compression fractures in the past with history of kyphoplasty.  Her most recent MRI of the thoracic spine did not show any new compression fractures.  She is also having some low back pain that can radiate to her hips.  We talked about the need for pain management.  I have referred her to pain management.  I will give her an acute prescription of tramadol which she has had in the past and finds it helpful.  She will continue her Lyrica.  She has a follow-up appoint with Dr. Jerrell [name] on the 28th of this month.  We will see her back at that time.         Diagnoses and all orders  for this visit:    1. Chronic pain syndrome  -     traMADoL (Ultram) 50 mg tablet; Take 1 Tablet by mouth every four to six (4-6) hours as needed for Pain for up to 7 days. Max Daily Amount: 300 mg. Indications: pain  -     REFERRAL TO PAIN MANAGEMENT    2. Acute bilateral low back pain without sciatica  -     traMADoL (Ultram) 50 mg tablet; Take 1 Tablet by mouth every four to six (4-6) hours as needed for Pain for up to 7 days. Max Daily Amount: 300 mg. Indications: pain  -     REFERRAL TO PAIN MANAGEMENT    3. Thoracic compression fracture, sequela  -     REFERRAL TO PAIN MANAGEMENT    4. Chronic midline thoracic back pain  -     REFERRAL TO PAIN MANAGEMENT          We discussed the expected course, resolution and complications of the diagnosis(es) in detail.  Medication risks, benefits, costs, interactions, and alternatives were discussed as indicated.  I advised her to contact the office if her condition worsens, changes or fails to improve as anticipated. For emergencies or  worsening neurological symptoms the patient was instructed to call 911.  She expressed understanding with the diagnosis(es) and plan.       Follow-up and Dispositions    Return for August 28 with Dr. Wilford [name] scheduled.               Mary [name] is a 55 y.o. female being evaluated by a video visit encounter for concerns as above.  A caregiver was present when appropriate. Due to this being a Scientist, research (medical) (During COVID-19 public health emergency), evaluation of the following organ systems was limited: Vitals/Constitutional/EENT/Resp/CV/GI/GU/MS/Neuro/Skin/Heme-Lymph-Imm.  Pursuant to the emergency declaration under the [facility] Act and the [employer], 1135 waiver authority and the [employer] Act, this Virtual  Visit was conducted, with patient's (and/or legal guardian's) consent, to reduce the patient's risk of exposure to COVID-19 and provide necessary medical  care.             CPT Codes 9258752299 for Established Patients may apply to this Telehealth Visit  Time-based coding, delete if not needed: I spent at least 15 minutes with this established patient, and >50% of the time was spent counseling and/or coordinating care regarding   thoracic and back pain start time: 3:25 PM and Stop time: 3:48 PM.        Due to this being a TeleHealth evaluation, many elements of the physical examination are unable to be assessed.       Pursuant to the emergency declaration under the [facility] Act and the [employer], 1135 waiver authority and the [employer] Act, this Virtual  Visit was conducted, with patient's consent, to reduce the patient's risk of exposure to COVID-19 and provide continuity of care for an established patient.     Services were provided through a video synchronous discussion virtually to substitute for in-person clinic visit.    Valetta Fuller, NP

## 2020-12-16 NOTE — Progress Notes (Signed)
Patient resolved from COVID Care Transitions episode on 12/16/20.       Patient/family has been provided the following resources and education related to COVID-19:                         Signs, symptoms and red flags related to COVID-19            CDC exposure and quarantine guidelines            [facility] exposure contact - [phone]            Contact for their local Department of Health                 Patient currently reports that the following symptoms have improved:  cough, shortness of breath, and chills or shaking.  Still has some fatigue but feels like it is improving.      No further outreach scheduled with this CTN/ACM/LPN/HC/ MA.  Episode of Care resolved.  Patient has this CTN/ACM/LPN/HC/MA contact information if future needs arise.

## 2020-12-17 ENCOUNTER — Telehealth

## 2020-12-17 LAB — PT/INR EXTERNAL: INR, External: 2.2

## 2020-12-17 LAB — AMB PT/INR EXTERNAL: INR, External: 2.2 NA

## 2020-12-17 MED ORDER — WARFARIN 2.5 MG TAB
2.5 mg | ORAL_TABLET | Freq: Every day | ORAL | 2 refills | Status: AC
Start: 2020-12-17 — End: ?

## 2020-12-17 NOTE — Progress Notes (Signed)
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  Her INR goal is 2.5-3.5 and her current Coumadin dose is 5 mg every day 2.5 mg mon and friday.     Today's findings include an INR of 2.2     Considering [name] past history, todays findings, and per the coumadin policy/protocol, Mary [name] was instructed to take Coumadin as follows,  same dose.  She was also instructed to come back in 3 weeks for an INR check.    A full discussion of the nature of anticoagulants has been carried out.  A full discussion of the need for frequent and regular monitoring, precise dosage adjustment and compliance was stressed.  Side effects of potential bleeding were discussed and Mary [name] was instructed to call [phone] if there are any signs of abnormal bleeding.  [name] was instructed to avoid any OTC items containing aspirin or ibuprofen and prior to starting any new OTC products to consult with her physician or pharmacist to ensure no drug interactions are present.  [name] was instructed to avoid any major changes in her general diet and to avoid alcohol consumption.  .      [name] verbalized her understanding of all instructions and will call the office with any questions, concerns, or signs of abnormal bleeding or blood clot.

## 2020-12-22 ENCOUNTER — Ambulatory Visit: Primary: Family Medicine

## 2020-12-23 LAB — PT/INR EXTERNAL: INR, External: 1.5

## 2020-12-23 LAB — AMB PT/INR EXTERNAL: INR, External: 1.5 NA

## 2020-12-24 ENCOUNTER — Telehealth

## 2020-12-24 NOTE — Progress Notes (Signed)
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  Her INR goal is 2.5-3.5 and her current Coumadin dose is 5 mg alt 2.5 mg.     Today's findings include an INR of 1.5     Considering [name] past history, todays findings, and per the coumadin policy/protocol, [name] was instructed to take Coumadin as follows,  7.5 mg x 2 days then same dose.  She was also instructed to come back in 1 weeks for an INR check.    A full discussion of the nature of anticoagulants has been carried out.  A full discussion of the need for frequent and regular monitoring, precise dosage adjustment and compliance was stressed.  Side effects of potential bleeding were discussed and [name] was instructed to call [phone] if there are any signs of abnormal bleeding.  [name] was instructed to avoid any OTC items containing aspirin or ibuprofen and prior to starting any new OTC products to consult with her physician or pharmacist to ensure no drug interactions are present.  [name] was instructed to avoid any major changes in her general diet and to avoid alcohol consumption.  .      [name] verbalized her understanding of all instructions and will call the office with any questions, concerns, or signs of abnormal bleeding or blood clot.

## 2021-01-01 ENCOUNTER — Telehealth

## 2021-01-01 LAB — PT/INR EXTERNAL: INR, External: 2

## 2021-01-01 LAB — AMB PT/INR EXTERNAL: INR, External: 2 NA

## 2021-01-04 ENCOUNTER — Encounter: Attending: Pulmonary Disease | Primary: Family Medicine

## 2021-01-06 ENCOUNTER — Encounter

## 2021-01-07 LAB — PT/INR EXTERNAL: INR, External: 1.6

## 2021-01-07 LAB — AMB PT/INR EXTERNAL: INR, External: 1.6 NA

## 2021-01-08 ENCOUNTER — Telehealth

## 2021-01-08 NOTE — Progress Notes (Signed)
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  Her INR goal is 2.5-3.5 and her current Coumadin dose is 5 mg every day.     Today's findings include an INR of 1.6     Considering [name] past history, todays findings, and per the coumadin policy/protocol, [name] was instructed to take Coumadin as follows,  7.5 mg today then 5 mg every day.  She was also instructed to come back in 2 weeks for an INR check.    A full discussion of the nature of anticoagulants has been carried out.  A full discussion of the need for frequent and regular monitoring, precise dosage adjustment and compliance was stressed.  Side effects of potential bleeding were discussed and [name] was instructed to call [phone] if there are any signs of abnormal bleeding.  [name] was instructed to avoid any OTC items containing aspirin or ibuprofen and prior to starting any new OTC products to consult with her physician or pharmacist to ensure no drug interactions are present.  [name] was instructed to avoid any major changes in her general diet and to avoid alcohol consumption.  .      [name] called and left detailed message of all instructions and will call the office with any questions, concerns, or signs of abnormal bleeding or blood clot.

## 2021-01-10 ENCOUNTER — Encounter

## 2021-01-11 ENCOUNTER — Ambulatory Visit
Admit: 2021-01-11 | Discharge: 2021-01-11 | Payer: MEDICARE | Attending: Physical Medicine & Rehabilitation | Primary: Family Medicine

## 2021-01-11 ENCOUNTER — Ambulatory Visit: Attending: Physical Medicine & Rehabilitation | Primary: Family Medicine

## 2021-01-11 DIAGNOSIS — G8929 Other chronic pain: Secondary | ICD-10-CM

## 2021-01-11 DIAGNOSIS — M546 Pain in thoracic spine: Secondary | ICD-10-CM

## 2021-01-11 MED ORDER — TIZANIDINE 2 MG TAB
2 mg | ORAL_TABLET | Freq: Three times a day (TID) | ORAL | 1 refills | Status: DC | PRN
Start: 2021-01-11 — End: 2021-06-23

## 2021-01-11 MED ORDER — KETOROLAC TROMETHAMINE 60 MG/2 ML IM
60 mg/2 mL | Freq: Once | INTRAMUSCULAR | Status: AC
Start: 2021-01-11 — End: 2021-01-11
  Administered 2021-01-11: 19:00:00 via INTRAMUSCULAR

## 2021-01-11 MED ORDER — PREGABALIN 200 MG CAP
200 mg | ORAL_CAPSULE | Freq: Three times a day (TID) | ORAL | 2 refills | Status: AC
Start: 2021-01-11 — End: 2021-04-27

## 2021-01-11 NOTE — Progress Notes (Signed)
Consent was explained to the pt and signed. No questions or concerns voiced at this time. Pt given 30mg /47ml of toradol IM in left gluteus. No sign or symptoms of infection noted at injection site. There was no bleeding, swelling or leaking noted after injection. Pt handed injection information sheet to take home. Mary [name] tolerated the injection well and did not want to wait in the exam room for observation. She ambulated to check out with out any issues.
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
## 2021-01-11 NOTE — Progress Notes (Signed)
Tracie Harrier presents today for   Chief Complaint   Patient presents with    Back Pain       Is someone accompanying this pt? no    Is the patient using any DME equipment during OV? no    Depression Screening:  No flowsheet data found.    Learning Assessment:  Learning Assessment 08/12/2020   PRIMARY LEARNER Patient   PRIMARY LANGUAGE ENGLISH   LEARNER PREFERENCE PRIMARY READING     LISTENING   ANSWERED BY patient   RELATIONSHIP SELF       Abuse Screening:  No flowsheet data found.    Fall Risk  No flowsheet data found.    OPIOID RISK TOOL  No flowsheet data found.    Coordination of Care:  1. Have you been to the ER, urgent care clinic since your last visit? no  Hospitalized since your last visit? no    2. Have you seen or consulted any other health care providers outside of the [facility] since your last visit? no Include any pap smears or colon screening. no

## 2021-01-14 ENCOUNTER — Telehealth

## 2021-01-14 LAB — PT/INR EXTERNAL: INR, External: 1.6

## 2021-01-14 LAB — AMB PT/INR EXTERNAL: INR, External: 1.6 NA

## 2021-01-14 NOTE — Telephone Encounter (Signed)
The pt's order for an estim unit was faxed to [facility] for review. The office note and demographics were also sent. A fax confirmation was received and they will reach out to the pt. Order form sent to scanning.

## 2021-01-21 ENCOUNTER — Inpatient Hospital Stay: Admit: 2021-01-21 | Payer: MEDICARE | Attending: Family Medicine | Primary: Family Medicine

## 2021-01-21 ENCOUNTER — Encounter

## 2021-01-21 DIAGNOSIS — Z87891 Personal history of nicotine dependence: Secondary | ICD-10-CM

## 2021-01-21 DIAGNOSIS — Z1231 Encounter for screening mammogram for malignant neoplasm of breast: Secondary | ICD-10-CM

## 2021-01-22 ENCOUNTER — Telehealth

## 2021-01-22 LAB — PT/INR EXTERNAL: INR, External: 2.2

## 2021-01-22 LAB — AMB PT/INR EXTERNAL: INR, External: 2.2 NA

## 2021-01-22 MED ORDER — METOPROLOL SUCCINATE SR 25 MG 24 HR TAB
25 mg | ORAL_TABLET | Freq: Every day | ORAL | 1 refills | Status: AC
Start: 2021-01-22 — End: ?

## 2021-02-01 ENCOUNTER — Telehealth

## 2021-02-01 ENCOUNTER — Encounter: Attending: Physical Medicine & Rehabilitation | Primary: Family Medicine

## 2021-02-01 ENCOUNTER — Encounter

## 2021-02-01 LAB — PT/INR EXTERNAL: INR, External: 2.1

## 2021-02-01 LAB — AMB PT/INR EXTERNAL: INR, External: 2.1 NA

## 2021-02-03 NOTE — Telephone Encounter (Signed)
Patient called to follow up on her refill request. Patient uses [facility] on [address] Please advise.    Patient can be reached at [phone].

## 2021-02-03 NOTE — Telephone Encounter (Signed)
If this is regarding lyrica then a prescription was given on 01/11/21 with 2 refills

## 2021-02-03 NOTE — Telephone Encounter (Signed)
The patient just called back and stated the bottle that was prescribed on 01/06/2021 says no refills.

## 2021-02-03 NOTE — Telephone Encounter (Signed)
A chart review was done and there are 2 refills on the prescription that was sent electronically on 01/06/21. I tried to contact the pharmacy and was not able to get a hold of anyone to verify this. The phone kept routing back to the prompts. I tried calling the pt to make her aware of this and she was not able to reached. A message was left for the pt advising her to contact the pharmacy and have them look at the original prescription that was sent for the other 2 refills. The number to our office was provided in case she has any questions or concerns.

## 2021-02-03 NOTE — Telephone Encounter (Signed)
Call patient left a message to call back.

## 2021-02-05 ENCOUNTER — Telehealth

## 2021-02-05 LAB — PT/INR EXTERNAL: INR, External: 2.3

## 2021-02-05 LAB — AMB PT/INR EXTERNAL: INR, External: 2.3 NA

## 2021-02-12 ENCOUNTER — Telehealth

## 2021-02-12 LAB — PT/INR EXTERNAL: INR, External: 2.3

## 2021-02-12 LAB — AMB PT/INR EXTERNAL: INR, External: 2.3 NA

## 2021-02-19 ENCOUNTER — Encounter: Payer: MEDICARE | Primary: Family Medicine

## 2021-02-19 ENCOUNTER — Encounter: Payer: MEDICARE | Attending: Pulmonary Disease | Primary: Family Medicine

## 2021-02-22 ENCOUNTER — Telehealth

## 2021-02-22 LAB — PT/INR EXTERNAL: INR, External: 3.2

## 2021-02-22 LAB — AMB PT/INR EXTERNAL: INR, External: 3.2 NA

## 2021-02-25 ENCOUNTER — Encounter: Attending: Physical Medicine & Rehabilitation | Primary: Family Medicine

## 2021-03-01 ENCOUNTER — Telehealth

## 2021-03-01 LAB — PT/INR EXTERNAL: INR, External: 1.7

## 2021-03-01 LAB — AMB PT/INR EXTERNAL: INR, External: 1.7 NA

## 2021-03-08 ENCOUNTER — Telehealth

## 2021-03-08 LAB — PT/INR EXTERNAL: INR, External: 1.8

## 2021-03-08 LAB — AMB PT/INR EXTERNAL: INR, External: 1.8 NA

## 2021-03-15 ENCOUNTER — Telehealth

## 2021-03-15 LAB — PT/INR EXTERNAL: INR, External: 2

## 2021-03-15 LAB — AMB PT/INR EXTERNAL: INR, External: 2 NA

## 2021-03-24 ENCOUNTER — Telehealth

## 2021-03-24 LAB — PT/INR EXTERNAL: INR, External: 1.9

## 2021-03-24 LAB — AMB PT/INR EXTERNAL: INR, External: 1.9 NA

## 2021-03-25 ENCOUNTER — Ambulatory Visit: Payer: MEDICARE | Primary: Family Medicine

## 2021-03-25 ENCOUNTER — Ambulatory Visit: Payer: MEDICARE | Attending: Internal Medicine | Primary: Family Medicine

## 2021-03-31 ENCOUNTER — Ambulatory Visit: Payer: MEDICARE | Attending: Specialist | Primary: Family Medicine

## 2021-04-02 ENCOUNTER — Telehealth

## 2021-04-02 LAB — PT/INR EXTERNAL: INR, External: 5.1

## 2021-04-02 LAB — AMB PT/INR EXTERNAL: INR, External: 5.1 NA

## 2021-04-05 ENCOUNTER — Encounter: Payer: MEDICARE | Attending: Physical Medicine & Rehabilitation | Primary: Family Medicine

## 2021-04-05 NOTE — Telephone Encounter (Signed)
lvm to r/s missed appt;dc

## 2021-04-12 ENCOUNTER — Telehealth

## 2021-04-12 LAB — PT/INR EXTERNAL: INR, External: 3.3

## 2021-04-12 LAB — AMB PT/INR EXTERNAL: INR, External: 3.3 NA

## 2021-04-13 NOTE — Telephone Encounter (Signed)
Patient is asking if we can order a CT of lower back - she reports increasing pain since last visit in August.

## 2021-04-14 NOTE — Telephone Encounter (Signed)
The pt was contacted and notified of the provider's message. The pt was identified using 2 pt identifiers. She verbalized understanding and states that she is hanging in there at this time. The pt is aware that she is to go to urgent care if the pain gets worse between now and her appt.

## 2021-04-14 NOTE — Telephone Encounter (Signed)
She has an appt coming next week. Needs to evaluated to justify CT. If her pain is severe, needs to go to UC.

## 2021-04-15 ENCOUNTER — Ambulatory Visit: Payer: MEDICARE | Attending: Specialist | Primary: Family Medicine

## 2021-04-21 ENCOUNTER — Ambulatory Visit: Payer: MEDICARE | Attending: Nurse Practitioner | Primary: Family Medicine

## 2021-04-23 ENCOUNTER — Telehealth

## 2021-04-23 LAB — PT/INR EXTERNAL: INR, External: 2.7

## 2021-04-23 LAB — AMB PT/INR EXTERNAL: INR, External: 2.7 NA

## 2021-04-26 ENCOUNTER — Ambulatory Visit: Payer: MEDICARE | Primary: Family Medicine

## 2021-04-27 ENCOUNTER — Ambulatory Visit: Payer: MEDICARE | Attending: Internal Medicine | Primary: Family Medicine

## 2021-04-27 ENCOUNTER — Telehealth

## 2021-04-27 MED ORDER — PREGABALIN 200 MG CAP
200 mg | ORAL_CAPSULE | Freq: Three times a day (TID) | ORAL | 0 refills | Status: DC
Start: 2021-04-27 — End: 2021-05-27

## 2021-04-27 NOTE — Telephone Encounter (Signed)
Patient had to r/s 12/14 appointment, ppatient is requesting med refill on lyrica. Pharm was the [facility] on file

## 2021-04-28 ENCOUNTER — Encounter

## 2021-04-28 ENCOUNTER — Encounter: Attending: Nurse Practitioner | Primary: Family Medicine

## 2021-04-30 ENCOUNTER — Telehealth

## 2021-04-30 LAB — PT/INR EXTERNAL: INR, External: 1.8

## 2021-04-30 LAB — AMB PT/INR EXTERNAL: INR, External: 1.8 NA

## 2021-05-05 NOTE — Telephone Encounter (Signed)
Attempted to reach pt in regards to referral for chest mass. No answer left vm.

## 2021-05-26 ENCOUNTER — Telehealth

## 2021-05-26 NOTE — Telephone Encounter (Signed)
Patient called in requesting a refill on the following medication     Pregabalin (lyrica) 200 mg capsule      Patient advise callback when completed.  [phone]

## 2021-05-27 ENCOUNTER — Encounter

## 2021-05-27 MED ORDER — PREGABALIN 200 MG CAP
200 mg | ORAL_CAPSULE | Freq: Three times a day (TID) | ORAL | 0 refills | Status: DC
Start: 2021-05-27 — End: 2021-06-23

## 2021-05-27 NOTE — Telephone Encounter (Signed)
Call patient and used the two identifiers. Communicated to patient that medication was sent to the pharmacy. Keep the appointment on 06/04/2021. No further action is needed.

## 2021-05-27 NOTE — Telephone Encounter (Signed)
Sent , needs to keep appt

## 2021-05-27 NOTE — Telephone Encounter (Signed)
Pt called back to see if her medication for Lyrica as been refilled because she runs out tomorrow and her next appt is not until 1/20.     Please advise pt at [phone]

## 2021-05-28 ENCOUNTER — Telehealth

## 2021-05-28 LAB — PT/INR EXTERNAL: INR, External: 2

## 2021-05-28 LAB — AMB PT/INR EXTERNAL: INR, External: 2 NA

## 2021-06-04 ENCOUNTER — Ambulatory Visit: Payer: MEDICARE | Attending: Nurse Practitioner | Primary: Family Medicine

## 2021-06-06 ENCOUNTER — Emergency Department: Admit: 2021-06-06 | Payer: MEDICARE | Primary: Family Medicine

## 2021-06-06 ENCOUNTER — Inpatient Hospital Stay
Admit: 2021-06-06 | Discharge: 2021-06-06 | Disposition: A | Discharge status: Home / Self Care | Payer: MEDICARE | Attending: Emergency Medicine

## 2021-06-06 DIAGNOSIS — M79641 Pain in right hand: Secondary | ICD-10-CM

## 2021-06-06 MED ORDER — PREDNISONE 20 MG TAB
20 mg | ORAL | Status: AC
Start: 2021-06-06 — End: 2021-06-06
  Administered 2021-06-06: 20:00:00 via ORAL

## 2021-06-06 MED ORDER — PREDNISONE 20 MG TAB
20 mg | ORAL_TABLET | Freq: Two times a day (BID) | ORAL | 0 refills | Status: DC
Start: 2021-06-06 — End: 2021-06-06

## 2021-06-06 MED ORDER — PREDNISONE 20 MG TAB
20 mg | ORAL_TABLET | Freq: Two times a day (BID) | ORAL | 0 refills | Status: AC
Start: 2021-06-06 — End: 2021-06-12

## 2021-06-06 MED FILL — PREDNISONE 20 MG TAB: 20 mg | ORAL | Qty: 3

## 2021-06-06 NOTE — ED Provider Notes (Signed)
ED Provider Notes by Duwayne Heck, MD at 06/06/21 1249                Author: Duwayne Heck, MD  Service: Emergency Medicine  Author Type: Resident       Filed: 06/06/21 1438  Date of Service: 06/06/21 1249  Status: Attested           Editor: Duwayne Heck, MD (Resident)  Cosigner: Jerre Simon, MD at 06/08/21 [phone]          Attestation signed by Jerre Simon, MD at 06/08/21 [phone]          I was personally available for consultation in the emergency department. I have reviewed the chart prior to the patient's discharge and agree with  the documentation recorded by the [facility], including the assessment, treatment plan, and disposition.                                 EMERGENCY DEPARTMENT HISTORY AND PHYSICAL EXAM      12:49 PM      Date: 06/06/2021   Patient Name: Mary [name]        History of Presenting Illness          Chief Complaint       Patient presents with        ?  Hand Pain           History Provided By: Patient   Tracie Harrier 56 y.o. female, with PMHx Bipolar disorder, PTSD, COPD, T2DM, HTN, who presents for right hand pain in the distribution of her first second and third fingers and lateral palmar aspect a/w decreased range of motion secondary to pain.  No  prior trauma, falls, injury.  Patient does state that with her history of PTSD sometimes she does wake up with her hands clenched and around her teddy bear requiring her to pry open her own grip.  Unsure if she did this prior to onset of pain in her right  hand. Has previously had cellulitis and abscess of the third right finger that required admission for orthopedic washout and antibiotics, but this does not feel appear like that.  Denies fever, wrist pain, elbow pain, skin changes, rash, wounds.       PCP: Lenn Sink, MD        Current Facility-Administered Medications             Medication  Dose  Route  Frequency  Provider  Last Rate  Last Admin              ?  predniSONE (DELTASONE) tablet 60 mg   60 mg  Oral   NOW  Duwayne Heck, MD                Current Outpatient Medications          Medication  Sig  Dispense  Refill           ?  [START ON 06/07/2021] predniSONE (DELTASONE) 20 mg tablet  Take 1 Tablet by mouth two (2) times a day for 5 days. With Breakfast      Start taking this medication tomorrow morning  10 Tablet  0           ?  pregabalin (LYRICA) 200 mg capsule  Take 1 Capsule by mouth three (3) times daily.  90 Capsule  0           ?  metoprolol succinate (TOPROL-XL) 25 mg XL tablet  Take 1 Tablet by mouth daily.  90 Tablet  1     ?  BD Ultra-Fine Mini Pen Needle 31 gauge x 3/16" ndle  use daily as directed WITH TYMLOS         ?  tiZANidine (ZANAFLEX) 2 mg tablet  Take 1 Tablet by mouth three (3) times daily as needed for Muscle Spasm(s).  90 Tablet  1     ?  warfarin (COUMADIN) 2.5 mg tablet  Take 2 Tablets by mouth in the morning. Or as directed  180 Tablet  2     ?  cyanocobalamin 1,000 mcg tablet  cyanocobalamin (vit B-12) 1,000 mcg tablet    take 1 tablet by mouth daily as directed         ?  docusate sodium (COLACE) 100 mg capsule  Col-Rite 100 mg capsule    take 1 capsule by mouth twice a day if needed for constipation         ?  multivit-min-iron fum-folic ac 7.5 mg iron-400 mcg tab           ?  aspirin 81 mg chewable tablet  Take 81 mg by mouth daily.         ?  furosemide (LASIX) 40 mg tablet  Take 40 mg by mouth daily as needed (swelling).         ?  ipratropium-albuteroL (Combivent Respimat) 20-100 mcg/actuation inhaler  Take 1 Puff by inhalation every six (6) hours.  4 g  3     ?  fluticasone propionate (FLONASE) 50 mcg/actuation nasal spray  1 spray by nasal route daily  1 Each  2     ?  hydrOXYzine HCL (ATARAX) 25 mg tablet  Take 25 mg by mouth two (2) times daily as needed for Anxiety.         ?  albuterol (PROVENTIL HFA, VENTOLIN HFA, PROAIR HFA) 90 mcg/actuation inhaler  Take 2 Puffs by inhalation every six (6) hours as needed.         ?  cetirizine (ZYRTEC) 10 mg tablet  Take 10 mg by  mouth daily.         ?  lamoTRIgine (LaMICtal) 25 mg tablet  Take 75 mg by mouth daily.         ?  omeprazole (PRILOSEC) 40 mg capsule  Take 40 mg by mouth daily.               ?  pravastatin (PRAVACHOL) 40 mg tablet  Take 40 mg by mouth nightly.                   Past History        Past Medical History:     Past Medical History:        Diagnosis  Date         ?  Anxiety       ?  Bipolar 1 disorder (HCC)       ?  CHF (congestive heart failure) (HCC)       ?  COPD (chronic obstructive pulmonary disease) (HCC)       ?  COPD (chronic obstructive pulmonary disease) (HCC)       ?  H/O aortic valve replacement           ?  PTSD (post-traumatic stress disorder)             Past Surgical History:  Past Surgical History:         Procedure  Laterality  Date          ?  HAND/FINGER SURGERY UNLISTED  Right            I&D right middle finger          ?  HX HEART CATHETERIZATION         ?  HX HEART VALVE SURGERY              mitral valve replacement           Family History:     Family History         Problem  Relation  Age of Onset          ?  No Known Problems  Mother             Social History:     Social History          Tobacco Use         ?  Smoking status:  Every Day              Packs/day:  1.00         Types:  Cigarettes         ?  Smokeless tobacco:  Never       Substance Use Topics         ?  Alcohol use:  Never     ?  Drug use:  Yes              Types:  Marijuana           Allergies:     Allergies        Allergen  Reactions         ?  Adhesive  Rash     ?  Lithium  Nausea and Vomiting     ?  Valium [Diazepam]  Other (comments)             Gets suicidal         ?  Xanax [Alprazolam]  Other (comments)             Gets suicidal             Review of Systems        Review of Systems    Constitutional:  Negative for chills and fever.    HENT:  Negative for congestion, rhinorrhea and sore throat.     Respiratory:  Negative for cough and shortness of breath.     Cardiovascular:  Negative for chest pain and palpitations.     Gastrointestinal:  Negative for abdominal pain, diarrhea, nausea and vomiting.    Genitourinary:  Negative for difficulty urinating and dysuria.    Musculoskeletal:  Positive for arthralgias (right hand) and  joint swelling (right 1-3 fingers). Negative for gait problem and myalgias.    Skin:  Negative for color change, rash and wound.    Neurological:  Negative for dizziness, weakness, numbness and headaches.         Physical Exam     Visit Vitals      BP  127/71     Pulse  63     Temp  98.6 ??F (37 ??C)     Resp  18     Ht  5\' 3"  (1.6 m)     Wt  90.7 kg (200 lb)     SpO2  98%  BMI  35.43 kg/m??           Physical Exam   Vitals and nursing note reviewed.    Constitutional:        General: She is not in acute distress.   HENT:       Head: Normocephalic and atraumatic.       Nose: Nose normal.       Mouth/Throat:       Mouth: Mucous membranes are moist.       Pharynx: Oropharynx is clear.    Eyes:       Extraocular Movements: Extraocular movements intact.       Conjunctiva/sclera: Conjunctivae normal.     Cardiovascular:       Rate and Rhythm: Normal rate and regular rhythm.       Heart sounds: Murmur heard.    Pulmonary:       Effort: Pulmonary effort is normal. No respiratory distress.     Abdominal:       General: Abdomen is flat. There is no distension.     Musculoskeletal:       Cervical back: Normal range of motion and neck supple.    Skin:      General: Skin is warm and dry.    Neurological:       Mental Status: She is alert and oriented to person, place, and time. Mental status is at baseline.    Psychiatric:          Attention and Perception: She does not perceive auditory or visual hallucinations.          Mood and Affect: Mood is anxious.          Speech: Speech is tangential.          Behavior: Behavior normal.       Comments: Does not appear to be a danger to herself or others.  No hallucinations.  No SI/HI.  No evidence of mania.            Diagnostic Study Results        Labs -   No results found  for this or any previous visit (from the past 12 hour(s)).      Radiologic Studies -      XR HAND RT MIN 3 V       Final Result          Normal study.                    Medical Decision Making     I am the first provider for this patient.      I reviewed the vital signs, available nursing notes, past medical history, past surgical history, family history and social history.      Vital Signs: Reviewed the patient's vital signs.      Records Reviewed: Nursing Notes, Old Medical Records, Previous Radiology Studies, and Previous Laboratory Studies (Time of Review: 12:49 PM)      MDM   Number of Diagnoses or Management Options   Hand pain, right   Diagnosis management comments: 56 year old female presenting with right hand pain in the distribution of right first second and third fingers and the lateral palmar aspect of the right hand.  No prior injury, trauma, fall.  On physical exam right first  through third fingers appear minimally more swollen than the left, but no erythema, wound, rash, or other evidence of infection; doubt cellulitis or abscess.  Right hand x-ray without  fracture or dislocation.  Patient already in contact with PCP and orthopedics  regarding the same pain, but was unable to make it to her orthopedic appointment last Friday.  Pain and swelling more likely from strain/sprain, and would benefit from further outpatient management and work-up if pain continues.              ED Course: Progress Notes, Reevaluation, and Consults:     ED Course as of 06/06/21 1438       Sun Jun 06, 2021        1412  Right hand x-ray without evidence of fracture or dislocation.  Do not visualize any acute process.  [DR]        1415  Patient already taking Lyrica, Tylenol, PRN Ibuprofen at home for pain.  Recommended she apply ice as needed as well.  She states her pain does improve  when wrapped with Ace band keeping her thumb stable, applied Ace wrap in the ED.     [DR]        1437  Discussed case with attending.  Will  give 1 dose of 60 mg prednisone here and send home with 5 days of prednisone 20mg  BID to start tomorrow. Will place  pt in volar splint. Strict return precautions discussed, all questions answered, patient comfortable with plan.      [DR]              ED Course User Index   [DR] , MD        Procedures        Diagnosis        Clinical Impression:       1.  Hand pain, right            Disposition: Discharged        Follow-up Information                  Follow up With  Specialties  Details  Why  Contact Info              Duwayne Heck, MD  Family Medicine  Call in 2 days  Follow up with your PCP  [address] [facility]   [place]   [phone]          Your orthopedist    Go to   Follow up with your orthopedist as scheduled                [facility] EMERGENCY DEPT  Emergency Medicine  Go to   As needed, If symptoms worsen  [address]   [phone]                        Patient's Medications       Start Taking           PREDNISONE (DELTASONE) 20 MG TABLET     Take 1 Tablet by mouth two (2) times a day for 5 days. With Breakfast      Start taking this medication tomorrow morning       Continue Taking           ALBUTEROL (PROVENTIL HFA, VENTOLIN HFA, PROAIR HFA) 90 MCG/ACTUATION INHALER     Take 2 Puffs by inhalation every six (6) hours as needed.           ASPIRIN 81 MG CHEWABLE TABLET     Take 81 mg by mouth  daily.           BD ULTRA-FINE MINI PEN NEEDLE 31 GAUGE X 3/16" NDLE     use daily as directed WITH TYMLOS           CETIRIZINE (ZYRTEC) 10 MG TABLET     Take 10 mg by mouth daily.           CYANOCOBALAMIN 1,000 MCG TABLET     cyanocobalamin (vit B-12) 1,000 mcg tablet    take 1 tablet by mouth daily as directed           DOCUSATE SODIUM (COLACE) 100 MG CAPSULE     Col-Rite 100 mg capsule    take 1 capsule by mouth twice a day if needed for constipation           FLUTICASONE PROPIONATE (FLONASE) 50 MCG/ACTUATION NASAL SPRAY     1 spray by nasal route daily            FUROSEMIDE (LASIX) 40 MG TABLET     Take 40 mg by mouth daily as needed (swelling).           HYDROXYZINE HCL (ATARAX) 25 MG TABLET     Take 25 mg by mouth two (2) times daily as needed for Anxiety.           IPRATROPIUM-ALBUTEROL (COMBIVENT RESPIMAT) 20-100 MCG/ACTUATION INHALER     Take 1 Puff by inhalation every six (6) hours.           LAMOTRIGINE (LAMICTAL) 25 MG TABLET     Take 75 mg by mouth daily.           METOPROLOL SUCCINATE (TOPROL-XL) 25 MG XL TABLET     Take 1 Tablet by mouth daily.           MULTIVIT-MIN-IRON FUM-FOLIC AC 7.5 MG IRON-400 MCG TAB                OMEPRAZOLE (PRILOSEC) 40 MG CAPSULE     Take 40 mg by mouth daily.           PRAVASTATIN (PRAVACHOL) 40 MG TABLET     Take 40 mg by mouth nightly.             PREGABALIN (LYRICA) 200 MG CAPSULE     Take 1 Capsule by mouth three (3) times daily.           TIZANIDINE (ZANAFLEX) 2 MG TABLET     Take 1 Tablet by mouth three (3) times daily as needed for Muscle Spasm(s).           WARFARIN (COUMADIN) 2.5 MG TABLET     Take 2 Tablets by mouth in the morning. Or as directed       These Medications have changed          No medications on file       Stop Taking          No medications on file        Disclaimer: Sections of this note are dictated using utilizing voice recognition software.  Minor typographical errors may be present. If questions arise, please do not hesitate to contact me or call our department.

## 2021-06-06 NOTE — ED Notes (Signed)
Volar splint applied at right hand.    Seen and discharged by provider.

## 2021-06-06 NOTE — ED Notes (Signed)
Pt in ED with c/o Right hand pain. Pt states she was supposed to her orthopedic doctor on Friday but was unable to go.

## 2021-06-07 ENCOUNTER — Telehealth

## 2021-06-09 ENCOUNTER — Encounter: Payer: MEDICARE | Attending: Nurse Practitioner | Primary: Family Medicine

## 2021-06-10 ENCOUNTER — Ambulatory Visit: Admit: 2021-06-10 | Discharge: 2021-06-10 | Payer: MEDICARE | Attending: Orthopaedic Surgery | Primary: Family Medicine

## 2021-06-10 ENCOUNTER — Ambulatory Visit: Attending: Orthopaedic Surgery | Primary: Family Medicine

## 2021-06-10 DIAGNOSIS — M65311 Trigger thumb, right thumb: Secondary | ICD-10-CM

## 2021-06-10 MED ORDER — TRIAMCINOLONE ACETONIDE 10 MG/ML SUSP FOR INJECTION
10 mg/mL | Freq: Once | INTRAMUSCULAR | Status: AC
Start: 2021-06-10 — End: 2021-06-10
  Administered 2021-06-10: 14:00:00

## 2021-06-10 NOTE — Telephone Encounter (Signed)
Patient was seen today by Dr Andrey Campanile and forgot to ask him if she was suppose to continue the medication that she was given at the ER or discontinue use.    Patient stated she was taking prednisone.    Patient advise callback   [phone]

## 2021-06-10 NOTE — Progress Notes (Signed)
Mary [name] is a 56 y.o. female right handed individual, not currently working.  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    Vitals:    06/10/21 0832   Weight: 198 lb (89.8 kg)   Height: 5\' 3"  (1.6 m)   PainSc:   6   PainLoc: Hand           Chief Complaint   Patient presents with    Hand Pain     Right hand pain     HPI: Patient presents today with a new problem of right thumb pain and locking.    Initial HPI: Patient presents today for follow-up of right middle finger I&D of the dorsum of the finger as well as the flexor tendon sheath.  She reports to be doing well and has completed her antibiotics.  She denies any recurrence of pain.  She reports some stiffness in the finger and she is doing some exercises at home.  She reports no one is called her about therapy yet.    Date of onset: Early January 2023    Injury:  Unknown    Prior Treatment:  Yes: Comment: Debridement surgical of right middle finger    Numbness/ Tingling: No      ROS: Review of Systems - General ROS: negative  Psychological ROS: negative  ENT ROS: negative  Allergy and Immunology ROS: negative  Hematological and Lymphatic ROS: negative  Respiratory ROS: no cough, shortness of breath, or wheezing  Cardiovascular ROS: no chest pain or dyspnea on exertion  Gastrointestinal ROS: no abdominal pain, change in bowel habits, or black or bloody stools  Musculoskeletal ROS: negative  Neurological ROS: negative  Dermatological ROS: negative    Past Medical History:   Diagnosis Date    Anxiety     Bipolar 1 disorder (HCC)     CHF (congestive heart failure) (HCC)     COPD (chronic obstructive pulmonary disease) (HCC)     COPD (chronic obstructive pulmonary disease) (HCC)     H/O aortic valve replacement     PTSD (post-traumatic stress disorder)        Past Surgical History:   Procedure Laterality Date    HX HEART CATHETERIZATION      HX HEART VALVE SURGERY      mitral valve replacement    PR UNLISTED PROCEDURE HANDS/FINGERS Right      I&D right middle finger       Current Outpatient Medications   Medication Sig Dispense Refill    predniSONE (DELTASONE) 20 mg tablet Take 1 Tablet by mouth two (2) times a day for 5 days. With Breakfast Start taking this medication tomorrow morning 10 Tablet 0    pregabalin (LYRICA) 200 mg capsule Take 1 Capsule by mouth three (3) times daily. 90 Capsule 0    metoprolol succinate (TOPROL-XL) 25 mg XL tablet Take 1 Tablet by mouth daily. 90 Tablet 1    BD Ultra-Fine Mini Pen Needle 31 gauge x 3/16" ndle use daily as directed WITH TYMLOS      tiZANidine (ZANAFLEX) 2 mg tablet Take 1 Tablet by mouth three (3) times daily as needed for Muscle Spasm(s). 90 Tablet 1    warfarin (COUMADIN) 2.5 mg tablet Take 2 Tablets by mouth in the morning. Or as directed 180 Tablet 2    cyanocobalamin 1,000 mcg tablet cyanocobalamin (vit B-12) 1,000 mcg tablet   take 1 tablet by mouth daily as directed      docusate sodium (COLACE) 100 mg  capsule Col-Rite 100 mg capsule   take 1 capsule by mouth twice a day if needed for constipation      multivit-min-iron fum-folic ac 7.5 mg iron-400 mcg tab       aspirin 81 mg chewable tablet Take 81 mg by mouth daily.      furosemide (LASIX) 40 mg tablet Take 40 mg by mouth daily as needed (swelling).      ipratropium-albuteroL (Combivent Respimat) 20-100 mcg/actuation inhaler Take 1 Puff by inhalation every six (6) hours. 4 g 3    fluticasone propionate (FLONASE) 50 mcg/actuation nasal spray 1 spray by nasal route daily 1 Each 2    hydrOXYzine HCL (ATARAX) 25 mg tablet Take 25 mg by mouth two (2) times daily as needed for Anxiety.      albuterol (PROVENTIL HFA, VENTOLIN HFA, PROAIR HFA) 90 mcg/actuation inhaler Take 2 Puffs by inhalation every six (6) hours as needed.      cetirizine (ZYRTEC) 10 mg tablet Take 10 mg by mouth daily.      lamoTRIgine (LaMICtal) 25 mg tablet Take 75 mg by mouth daily.      omeprazole (PRILOSEC) 40 mg capsule Take 40 mg by mouth daily.      pravastatin (PRAVACHOL) 40 mg  tablet Take 40 mg by mouth nightly.         Current Facility-Administered Medications   Medication Dose Route Frequency Provider Last Rate Last Admin    triamcinolone acetonide (KENALOG) 10 mg/mL injection 5 mg  5 mg Other ONCE Amunique Neyra A, DO           Allergies   Allergen Reactions    Adhesive Rash    Lithium Nausea and Vomiting    Valium [Diazepam] Other (comments)     Gets suicidal    Xanax [Alprazolam] Other (comments)     Gets suicidal           PE:     Physical Exam  Vitals and nursing note reviewed.   Constitutional:       General: She is not in acute distress.     Appearance: Normal appearance. She is not ill-appearing.   Cardiovascular:      Pulses: Normal pulses.   Pulmonary:      Effort: Pulmonary effort is normal. No respiratory distress.   Musculoskeletal:         General: Tenderness present. No swelling, deformity or signs of injury.      Cervical back: Normal range of motion.      Right lower leg: No edema.      Left lower leg: No edema.   Skin:     General: Skin is warm and dry.      Capillary Refill: Capillary refill takes less than 2 seconds.      Findings: No bruising or erythema.   Neurological:      General: No focal deficit present.      Mental Status: She is alert and oriented to person, place, and time.   Psychiatric:         Mood and Affect: Mood normal.         Behavior: Behavior normal.          Hand:    Examination L Digit(s) R Digit(s)   1st CMC Tenderness -  -    1st CMC Grind -  -    Bouchard Nodes -  -    Heberden Nodes -  -    A1 Pulley Tenderness -  +  Th   Triggering -  +    UCL Instability -  -    RCL Instability -  -    Lateral Stress Pain -  -    Palmar Cords -  -    Tabletop test -  -    Garrod's Pads -  -    Grip Strength       Pinch Strength         ROM: Full        Imaging:     None indicated today.        ICD-10-CM ICD-9-CM    1. Trigger thumb of right hand  M65.311 727.03 INJECT TENDON SHEATH/LIGAMENT      triamcinolone acetonide (KENALOG) 10 mg/mL injection 5 mg             Plan:     Right trigger thumb injection    Follow-up and Dispositions    Return if symptoms worsen or fail to improve.          Plan was reviewed with patient, who verbalized agreement and understanding of the plan    [facility]  OFFICE PROCEDURE PROGRESS NOTE        Chart reviewed for the following:   I, Jerred Zaremba A Jervis Trapani, DO, have reviewed the History, Physical and updated the Allergic reactions for Tracie Harrier     TIME OUT performed immediately prior to start of procedure:   I, Peggyann Shoals, DO, have performed the following reviews on SUJEY GUNDRY prior to the start of the procedure:            * Patient was identified by name and date of birth   * Agreement on procedure being performed was verified  * Risks and Benefits explained to the patient  * Procedure site verified and marked as necessary  * Patient was positioned for comfort  * Consent was signed and verified     Time: 08:46 AM      Date of procedure: 06/10/2021    Procedure performed by:  Peggyann Shoals, DO    Provider assisted by: Rosendo Gros MA    Patient assisted by: self    How tolerated by patient: tolerated the procedure well with no complications    Post Procedural Pain Scale: 0 - No Hurt    Comments: none    Procedure:  After consent was obtained, using sterile technique the right thumb was prepped. Local anesthetic used: 1% lidocaine. Kenalog 5 mg and was then injected and the needle withdrawn.  The procedure was well tolerated.  The patient is asked to continue to rest the area for a few more days before resuming regular activities.  It may be more painful for the first 1-2 days.  Watch for fever, or increased swelling or persistent pain in the joint. Call or return to clinic prn if such symptoms occur or there is failure to improve as anticipated.

## 2021-06-10 NOTE — Telephone Encounter (Signed)
Spoke with patient and she verbalized understanding.

## 2021-06-10 NOTE — Telephone Encounter (Signed)
If its a prednisone dosepak she should definitely completed as per the instructions.

## 2021-06-17 ENCOUNTER — Encounter: Payer: MEDICARE | Attending: Surgery | Primary: Family Medicine

## 2021-06-17 NOTE — Telephone Encounter (Signed)
Reached out to Pt to reschedule 06/17/2021 appt. For 07/01/2021. Left voicemail for patient.

## 2021-06-17 NOTE — Telephone Encounter (Signed)
LVM to have pt call back to reschedule appt. PER SHERRI; MS

## 2021-06-17 NOTE — Telephone Encounter (Signed)
Called Pt. To reschedule appt. On 06/17/2021. Pt did not answer, left a voicemail. Will attempt contacting pt again.

## 2021-06-22 ENCOUNTER — Encounter

## 2021-06-22 NOTE — Telephone Encounter (Signed)
Patient has been scheduled

## 2021-06-22 NOTE — Telephone Encounter (Signed)
Patient called and is asking for a refill on the Lyrica medication from Dr.Arora.    [facility] on [address]  Tel. [phone].    Patient tel. [phone].    Note: patient had an appt to see NP Blount on 06/23/21 but cancelled due to pt said she forgot to set up transportation. Patient asked to reschedule appt to see Dr. Wilford Corner instead. Patient appt with Dr.Arora is on 08/09/21.

## 2021-06-22 NOTE — Telephone Encounter (Signed)
Pt has to have a fu visit for this medication.

## 2021-06-23 ENCOUNTER — Ambulatory Visit
Admit: 2021-06-23 | Discharge: 2021-06-23 | Payer: MEDICARE | Attending: Physical Medicine & Rehabilitation | Primary: Family Medicine

## 2021-06-23 ENCOUNTER — Ambulatory Visit: Attending: Physical Medicine & Rehabilitation | Primary: Family Medicine

## 2021-06-23 DIAGNOSIS — M5137 Other intervertebral disc degeneration, lumbosacral region: Secondary | ICD-10-CM

## 2021-06-23 MED ORDER — KETOROLAC TROMETHAMINE 30 MG/ML INJECTION
30 mg/mL (1 mL) | INTRAMUSCULAR | Status: AC
Start: 2021-06-23 — End: 2021-06-23
  Administered 2021-06-23: 20:00:00 via INTRAMUSCULAR

## 2021-06-23 MED ORDER — PREGABALIN 200 MG CAP
200 mg | ORAL_CAPSULE | Freq: Three times a day (TID) | ORAL | 0 refills | Status: AC
Start: 2021-06-23 — End: ?

## 2021-06-23 MED ORDER — TIZANIDINE 2 MG TAB
2 mg | ORAL_TABLET | Freq: Three times a day (TID) | ORAL | 2 refills | Status: AC | PRN
Start: 2021-06-23 — End: ?

## 2021-06-23 NOTE — Progress Notes (Signed)
Consent was explained to the pt and signed. No questions or concerns voiced at this time. Pt given 30mg /69ml of toradol IM in right gluteus. No sign or symptoms of infection noted at injection site. There was no bleeding, swelling or leaking noted after injection. Pt handed injection information sheet to take home. Mary [name] tolerated the injection well. She declined to wait the ten minutes. She ambulated to check out with out any issues.

## 2021-06-23 NOTE — Progress Notes (Signed)
FANTA WIMBERLEY presents today for   Chief Complaint   Patient presents with    Back Pain     Lower      Hip Pain     right       Is someone accompanying this pt? no    Is the patient using any DME equipment during OV? no      Learning Assessment:  Learning Assessment 08/12/2020   PRIMARY LEARNER Patient   PRIMARY LANGUAGE ENGLISH   LEARNER PREFERENCE PRIMARY READING     LISTENING   ANSWERED BY patient   RELATIONSHIP SELF         Coordination of Care:  1. Have you been to the ER, urgent care clinic since your last visit? Yes, pt went to the ER for a trigger finger issue on her right hand.   Hospitalized since your last visit? no    2. Have you seen or consulted any other health care providers outside of the [facility] since your last visit? Yes, pcp and ortho Include any pap smears or colon screening. no
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
## 2021-06-24 ENCOUNTER — Encounter: Payer: MEDICARE | Attending: Nurse Practitioner | Primary: Family Medicine

## 2021-07-05 ENCOUNTER — Encounter: Payer: MEDICARE | Attending: Surgery | Primary: Family Medicine

## 2021-07-12 ENCOUNTER — Ambulatory Visit: Admit: 2021-07-12 | Discharge: 2021-07-12 | Payer: MEDICARE | Attending: Surgery | Primary: Family Medicine

## 2021-07-12 DIAGNOSIS — K439 Ventral hernia without obstruction or gangrene: Secondary | ICD-10-CM

## 2021-07-12 NOTE — Progress Notes (Signed)
General Surgery Consult      Mary HarrierKaren C Stillion  Admit date: (Not on file)    MRN: [id number]     DOB: 1965-08-30     Age: 56 y.o.        Attending Physician: Bradly ChrisYassar K Perrin Gens, MD, FACS      History of Present Illness:     Mary HarrierKaren C [name] is a 56 y.o. female who was referred to me for evaluation of 2 issues.  The first is a chest wall mass and the second issue is a possible abdominal wall hernia.  The patient has multiple significant medical conditions and she stated that since her open heart surgery which was done 5 years ago she noticed a mass located at the epigastric area at the site of the scar from her chest tube placement most likely.  She said that the mass has been there all the time but sometimes it increases and decreases in size.  Its not causing any significant pain.   Also the patient been having some abdominal pain mainly in the supraumbilical area with a large bulge and according to her that she think it is a hernia.  She had a previous midline laparotomy but it was an infraumbilical for hysterectomy in the past but no previous upper abdominal surgeries.  She stated that the bulge is located in the supraumbilical area.     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    Anxiety disorder 07/10/2020    Bipolar affective disorder, depressed in partial remission (HCC) 07/10/2020    Heart valve pulmonary stenosis 07/10/2020    Lumbar compression fracture (HCC) 07/10/2020    Afib (HCC) 07/10/2020    Allergy 07/10/2020    Abrasion of right middle  finger with infection 07/05/2020    Suppurative tenosynovitis of flexor tendon of right hand 07/05/2020    Cellulitis and abscess of hand 07/05/2020    Abscess of finger 07/05/2020    Chronic pain syndrome 06/03/2020    COVID-19 05/03/2020    Respiratory failure (HCC) 05/03/2020    Mitral regurgitation 04/23/2020    DDD (degenerative disc disease), lumbosacral 08/21/2019    TIA (transient ischemic attack) 07/24/2017    Severe obesity with body mass index (BMI) of 35.0 to 39.9 with serious comorbidity (HCC) 07/08/2017    Sensorineural hearing loss (SNHL) of both ears 07/05/2017    Bipolar I disorder with depression, severe (HCC) 02/07/2017    Severe obesity (BMI >= 40) (HCC) 12/17/2015    Chronic obstructive pulmonary disease (HCC) 08/24/2015    Moderate to severe pulmonary hypertension (HCC) 07/10/2015    Diastolic CHF, chronic (HCC) 07/05/2015    Dyslipidemia 06/12/2015    Onycholysis 03/20/2015    Facial neuropathy, traumatic, left, 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    Nicotine dependence 10/09/2013    Nodule of right lung 09/25/2013    Onychomycosis 03/18/2013    Antral gastritis 09/28/2012    Allergic rhinitis 11/09/2011     Past Medical History:   Diagnosis  Date    Anxiety     Bipolar 1 disorder (HCC)     CHF (congestive heart failure) (HCC)     COPD (chronic obstructive pulmonary disease) (HCC)     COPD (chronic obstructive pulmonary disease) (HCC)     H/O aortic valve replacement     PTSD (post-traumatic stress disorder)       Past Surgical History:   Procedure Laterality Date    CARDIAC CATHETERIZATION      CARDIAC VALVE SURGERY      mitral valve replacement    HAND/FINGER SURGERY UNLISTED Right     I&D right middle finger      Social History     Tobacco Use    Smoking status: Every Day     Packs/day: 1.00     Types: Cigarettes    Smokeless tobacco: Never   Substance Use Topics    Alcohol use: Never      Social History     Tobacco Use   Smoking Status Every Day    Packs/day: 1.00    Types: Cigarettes   Smokeless Tobacco  Never     Family History   Problem Relation Age of Onset    No Known Problems Mother       Current Outpatient Medications   Medication Sig    albuterol sulfate HFA (PROVENTIL;VENTOLIN;PROAIR) 108 (90 Base) MCG/ACT inhaler Inhale 2 puffs into the lungs every 6 hours as needed    aspirin 81 MG chewable tablet Take 81 mg by mouth daily    cetirizine (ZYRTEC) 10 MG tablet Take 10 mg by mouth daily    cyanocobalamin 1000 MCG tablet cyanocobalamin (vit B-12) 1,000 mcg tablet   take 1 tablet by mouth daily as directed    docusate (COLACE, DULCOLAX) 100 MG CAPS Col-Rite 100 mg capsule   take 1 capsule by mouth twice a day if needed for constipation    fluticasone (FLONASE) 50 MCG/ACT nasal spray 1 spray by nasal route daily    furosemide (LASIX) 40 MG tablet Take 40 mg by mouth daily as needed    hydrOXYzine HCl (ATARAX) 25 MG tablet Take 25 mg by mouth 2 times daily as needed    albuterol-ipratropium (COMBIVENT RESPIMAT) 20-100 MCG/ACT AERS inhaler Inhale 1 puff into the lungs in the morning and 1 puff at noon and 1 puff in the evening and 1 puff before bedtime.    lamoTRIgine (LAMICTAL) 25 MG tablet Take 75 mg by mouth daily    metoprolol succinate (TOPROL XL) 25 MG extended release tablet Take 25 mg by mouth daily    omeprazole (PRILOSEC) 40 MG delayed release capsule Take 40 mg by mouth daily    pravastatin (PRAVACHOL) 40 MG tablet Take 40 mg by mouth    pregabalin (LYRICA) 200 MG capsule Take 200 mg by mouth 3 times daily.    tiZANidine (ZANAFLEX) 2 MG tablet Take 2 mg by mouth 3 times daily as needed    warfarin (COUMADIN) 2.5 MG tablet Take 5 mg by mouth daily     No current facility-administered medications for this visit.      Allergies   Allergen Reactions    Alprazolam Other (See Comments)     Gets suicidal    Diazepam Other (See Comments)     Gets suicidal    Tobacco     Adhesive Tape Rash    Lithium Nausea And Vomiting        Review of Systems:  Pertinent items are noted  in the History of Present  Illness.    Objective:     BP 134/72    Pulse 67    Ht 5\' 3"  (1.6 m)    Wt 194 lb (88 kg)    SpO2 94%    BMI 34.37 kg/m??     Physical Exam:      General:  in no apparent distress   Eyes:  conjunctivae and sclerae normal, pupils equal, round, reactive to light   Throat & Neck: normal, no erythema or exudates noted. , and no palpable masses   Chest:   There is a scar in the epigastric area consistent from her previous open heart surgery.  There is soft tissue mass most likely represent part of the sternum or the scar but could not feel a clear mass by itself.    Heart:  Regular rate and rhythm   Abdomen:   rounded and obese, soft, nontender, nondistended, no masses or organomegaly.  There is a midline laparotomy scar in the infraumbilical area that is well-healed.  There is a bulge in the supraumbilical area that I am not sure if straight to her morbid obesity or a hernia but I could not feel a clear hernia on examination.    Extremities: extremities normal, atraumatic, no cyanosis or edema   Skin: Normal.       Imaging and Lab Review:     CBC:   Lab Results   Component Value Date/Time    WBC 4.8 12/09/2020 09:54 AM    RBC 4.59 12/09/2020 09:54 AM    HGB 12.3 12/09/2020 09:54 AM    HCT 38.5 12/09/2020 09:54 AM    PLT 191 12/09/2020 09:54 AM     BMP:   Lab Results   Component Value Date/Time    NA 142 12/09/2020 09:54 AM    K 3.4 12/09/2020 09:54 AM    CL 109 12/09/2020 09:54 AM    CO2 25 12/09/2020 09:54 AM    BUN 8 12/09/2020 09:54 AM     CMP:  Lab Results   Component Value Date/Time    NA 142 12/09/2020 09:54 AM    K 3.4 12/09/2020 09:54 AM    CL 109 12/09/2020 09:54 AM    CO2 25 12/09/2020 09:54 AM    BUN 8 12/09/2020 09:54 AM    GLOB 3.1 12/09/2020 09:54 AM       No results found for this or any previous visit (from the past 24 hour(s)).    images and reports reviewed    Assessment:   Mary [name] is a 56 y.o. female who is presenting with the abdominal bulge and a chest wall mass.  First I discussed with the  patient her abdominal bulge and I told her that it could be a hernia but hard to assess because of her body habitus and I believe a CT scan of abdomen and pelvis would help.  However I did explain to her that she is at high risk of complication because of her multiple significant medical conditions and her body habitus and even if there is a hernia we will have to discuss it in detail decide if we should proceed with the surgery or not.   As for her chest wall mass I could not feel a clear mass and most likely this is related to her previous open heart surgery because it did happen directly after the open heart at the site of the scar.  The CT scan of abdomen  should help with identifying any soft tissue mass or even a hernia in this area as well.     Plan:     I placed an order for CT scan of abdomen and pelvis  Continue losing weight  Follow-up with me after the results    Please call me if you have any questions (cell phone: [phone])     Signed By: Bradly Chris, MD     July 12, 2021

## 2021-07-12 NOTE — Progress Notes (Signed)
Mary [name] is a 56 y.o. female (DOB: 20-Apr-1966) presenting to address:    Chief Complaint   Patient presents with    New Patient     Chest mass and hernia/ referred by Letta Median, PA-c       Medication list and allergies have been reviewed with Mary [name] and updated as of today's date.     I have gone over all Medical, Surgical and Social History with Mary [name] and updated/added the information accordingly.

## 2021-07-21 ENCOUNTER — Encounter

## 2021-07-21 MED ORDER — PREGABALIN 200 MG PO CAPS
200 MG | ORAL_CAPSULE | ORAL | 2 refills | Status: DC
Start: 2021-07-21 — End: 2021-10-15

## 2021-07-22 ENCOUNTER — Inpatient Hospital Stay: Payer: MEDICARE | Primary: Family Medicine

## 2021-08-06 ENCOUNTER — Ambulatory Visit: Payer: MEDICARE | Attending: Cardiovascular Disease | Primary: Family Medicine

## 2021-08-06 ENCOUNTER — Ambulatory Visit
Admit: 2021-08-06 | Discharge: 2021-08-06 | Payer: MEDICARE | Attending: Cardiovascular Disease | Primary: Family Medicine

## 2021-08-06 DIAGNOSIS — I5032 Chronic diastolic (congestive) heart failure: Secondary | ICD-10-CM

## 2021-08-06 MED ORDER — METOPROLOL SUCCINATE ER 25 MG PO TB24
25 MG | ORAL_TABLET | Freq: Every day | ORAL | 5 refills | Status: AC
Start: 2021-08-06 — End: 2021-08-27

## 2021-08-06 MED ORDER — PRAVASTATIN SODIUM 40 MG PO TABS
40 MG | ORAL_TABLET | Freq: Every day | ORAL | 5 refills | Status: DC
Start: 2021-08-06 — End: 2021-08-27

## 2021-08-06 MED ORDER — WARFARIN SODIUM 2.5 MG PO TABS
2.5 MG | ORAL_TABLET | Freq: Every day | ORAL | 3 refills | Status: AC
Start: 2021-08-06 — End: 2021-08-27

## 2021-08-06 MED ORDER — EMPAGLIFLOZIN 10 MG PO TABS
10 MG | ORAL_TABLET | Freq: Every day | ORAL | 5 refills | Status: DC
Start: 2021-08-06 — End: 2021-08-25

## 2021-08-06 NOTE — Progress Notes (Signed)
Have you had PND?  No if so how long  How bad     2.   Have you had Fatigue? No if so how long  how bad     3.   Have you had Dyspnea? No if so how long  how bad     4.   Have you had Orthopnea? No if so how long  how bad     5.   Have you had Palpitations? No if so how long  how frequent      how bad     6.   Have you had Syncope? No if so how long  how frequent  how     bad     7.   Have you had Chest Pain? Yes if so how long patient stated last for days how frequent once a week . Is     it  substernal?  Pressure like? Sharp pain Comes on with Activity or with large meals? Is it     relieved by rest or NTG? no how bad 8    8. Have you had any weight gain? No if so how long  how bad      9. Have you had any swelling? Yes if so how long a week how frequent last few month how     bad 8

## 2021-08-06 NOTE — Patient Instructions (Signed)
Learning About the Mediterranean Diet  What is the Mediterranean diet?     The Mediterranean diet is a style of eating rather than a diet plan. It features foods eaten in [place], [place], southern [place] and [place], and other countries along the [place] Sea. It emphasizes eating foods like fish, fruits, vegetables, beans, high-fiber breads and whole grains, nuts, and olive oil. This style of eating includes limited red meat, cheese, and sweets.  Why choose the Mediterranean diet?  A Mediterranean-style diet may improve heart health. It contains more fat than other heart-healthy diets. But the fats are mainly from nuts, unsaturated oils (such as fish oils and olive oil), and certain nut or seed oils (such as canola, soybean, or flaxseed oil). These fats may help protect the heart and blood vessels.  How can you get started on the Mediterranean diet?  Here are some things you can do to switch to a more Mediterranean way of eating.  What to eat  Eat a variety of fruits and vegetables each day, such as grapes, blueberries, tomatoes, broccoli, peppers, figs, olives, spinach, eggplant, beans, lentils, and chickpeas.  Eat a variety of whole-grain foods each day, such as oats, brown rice, and whole wheat bread, pasta, and couscous.  Eat fish at least 2 times a week. Try tuna, salmon, mackerel, lake trout, herring, or sardines.  Eat moderate amounts of low-fat dairy products, such as milk, cheese, or yogurt.  Eat moderate amounts of poultry and eggs.  Choose healthy (unsaturated) fats, such as nuts, olive oil, and certain nut or seed oils like canola, soybean, and flaxseed.  Limit unhealthy (saturated) fats, such as butter, palm oil, and coconut oil. And limit fats found in animal products, such as meat and dairy products made with whole milk. Try to eat red meat only a few times a month in very small amounts.  Limit sweets and desserts to only a few times a week. This includes sugar-sweetened drinks like soda.  The  Mediterranean diet may also include red wine with your meal--1 glass each day for women and up to 2 glasses a day for men.  Tips for eating at home  Use herbs, spices, garlic, lemon zest, and citrus juice instead of salt to add flavor to foods.  Add avocado slices to your sandwich instead of bacon.  Have fish for lunch or dinner instead of red meat. Brush the fish with olive oil, and broil or grill it.  Sprinkle your salad with seeds or nuts instead of cheese.  Cook with olive or canola oil instead of butter or oils that are high in saturated fat.  Switch from 2% milk or whole milk to 1% or fat-free milk.  Dip raw vegetables in a vinaigrette dressing or hummus instead of dips made from mayonnaise or sour cream.  Have a piece of fruit for dessert instead of a piece of cake. Try baked apples, or have some dried fruit.  Tips for eating out  Try broiled, grilled, baked, or poached fish instead of having it fried or breaded.  Ask your server to have your meals prepared with olive oil instead of butter.  Order dishes made with marinara sauce or sauces made from olive oil. Avoid sauces made from cream or mayonnaise.  Choose whole-grain breads, whole wheat pasta and pizza crust, brown rice, beans, and lentils.  Cut back on butter or margarine on bread. Instead, you can dip your bread in a small amount of olive oil.  Ask for a side   salad or grilled vegetables instead of french fries or chips.  Where can you learn more?  Go to https://www.healthwise.net/patientEd and enter O407 to learn more about "Learning About the Mediterranean Diet."  Current as of: Sep 21, 2020               Content Version: 13.5  ?? 2006-2022 [employer].   Care instructions adapted under license by [facility]. If you have questions about a medical condition or this instruction, always ask your healthcare professional. [employer], [employer] disclaims any warranty or liability for your use of this information.

## 2021-08-06 NOTE — Progress Notes (Signed)
HISTORY OF PRESENT ILLNESS  Mary [name] is a 56 y.o. female.    PMH History of aortic valve replacement Bioprosthetic, CHF, Bipolar, Anxiety, Left atrial clip,   -----  CARDIAC STUDIES  -----  2d tte 04/2020    Technical qualifiers: Echo study was technically difficult with poor endocardial visualization and limited due to patient's condition.    Left Ventricle: Left ventricle size is normal. Mildly increased wall thickness. See diagram for wall motion findings. EF by visual approximation is 50%.    Right Ventricle: Not well visualized.    Aortic Valve: Mild transvalvular regurgitation.    Mitral Valve: Bioprosthetic valve. Elevated prosthetic gradient. MV mean gradient is 9 mmHg.    Pulmonary Arteries: Pulmonary hypertension not present. The estimated pulmonary artery systolic pressure is 27 mmHg.  -----  LHC 04/2020  Mild single vessel coronary artery disease with preserved LV function.  Elevated LVEDP.  Recommend intense risk factor modification.  -----  Vascular duplex upper extremity venous left  ?? Acute non-occlusive deep vein thrombosis in 1 of 2 brachial veins within the left upper extremity.  ?? Superficial acute non-occlusive thrombosis in the cephalic and basilic veins within the left upper extremity.    Patient states that she gets upset and can get some chest discomfort, reports that trying to get to a psychiatrist.  Patient states that usually sharp.  Sometimes comes in the back but states that does not feel like muskuloskeletal.  There is a correlate with blood pressure elevation.  Reports that at least mild with exertion sometimes but restricted with back brace.  reports some swelling in the left leg, easy bruiser.  Denies blood clots.  Denies PND, denies fatigue, reports some chronic shortness of breath with minimal exertion, denies orthopnea, denies palpitations     Family History   Problem Relation Age of Onset    No Known Problems Mother        Past Medical History:   Diagnosis Date    Anxiety      Bipolar 1 disorder (HCC)     CHF (congestive heart failure) (HCC)     COPD (chronic obstructive pulmonary disease) (HCC)     COPD (chronic obstructive pulmonary disease) (HCC)     H/O aortic valve replacement     PTSD (post-traumatic stress disorder)        Past Surgical History:   Procedure Laterality Date    CARDIAC CATHETERIZATION      CARDIAC VALVE SURGERY      mitral valve replacement    HAND/FINGER SURGERY UNLISTED Right     I&D right middle finger       Social History     Tobacco Use    Smoking status: Every Day     Packs/day: 1.00     Types: Cigarettes    Smokeless tobacco: Never   Substance Use Topics    Alcohol use: Never       Allergies   Allergen Reactions    Alprazolam Other (See Comments)     Gets suicidal    Diazepam Other (See Comments)     Gets suicidal    Tobacco     Adhesive Tape Rash    Lithium Nausea And Vomiting       Prior to Admission medications    Medication Sig Start Date End Date Taking? Authorizing Provider   pregabalin (LYRICA) 200 MG capsule take 1 capsule by mouth three times a day 07/21/21 07/22/11 Yes Valetta Fuller, APRN - NP  albuterol sulfate HFA (PROVENTIL;VENTOLIN;PROAIR) 108 (90 Base) MCG/ACT inhaler Inhale 2 puffs into the lungs every 6 hours as needed 11/19/19  Yes Ar Automatic Reconciliation   aspirin 81 MG chewable tablet Take 81 mg by mouth daily   Yes Ar Automatic Reconciliation   cetirizine (ZYRTEC) 10 MG tablet Take 10 mg by mouth daily 10/23/19  Yes Ar Automatic Reconciliation   cyanocobalamin 1000 MCG tablet cyanocobalamin (vit B-12) 1,000 mcg tablet   take 1 tablet by mouth daily as directed   Yes Ar Automatic Reconciliation   docusate (COLACE, DULCOLAX) 100 MG CAPS Col-Rite 100 mg capsule   take 1 capsule by mouth twice a day if needed for constipation   Yes Ar Automatic Reconciliation   fluticasone (FLONASE) 50 MCG/ACT nasal spray 1 spray by nasal route daily 05/09/20  Yes Ar Automatic Reconciliation   furosemide (LASIX) 40 MG tablet Take 40 mg by mouth daily as  needed   Yes Ar Automatic Reconciliation   albuterol-ipratropium (COMBIVENT RESPIMAT) 20-100 MCG/ACT AERS inhaler Inhale 1 puff into the lungs 4 times daily 05/09/20  Yes Ar Automatic Reconciliation   lamoTRIgine (LAMICTAL) 25 MG tablet Take 75 mg by mouth daily 12/09/19  Yes Ar Automatic Reconciliation   metoprolol succinate (TOPROL XL) 25 MG extended release tablet Take 25 mg by mouth daily 01/22/21  Yes Ar Automatic Reconciliation   omeprazole (PRILOSEC) 40 MG delayed release capsule Take 40 mg by mouth daily 11/17/19  Yes Ar Automatic Reconciliation   warfarin (COUMADIN) 2.5 MG tablet Take 5 mg by mouth daily 12/17/20  Yes Ar Automatic Reconciliation   hydrOXYzine HCl (ATARAX) 25 MG tablet Take 25 mg by mouth 2 times daily as needed  Patient not taking: No sig reported    Ar Automatic Reconciliation   pravastatin (PRAVACHOL) 40 MG tablet Take 40 mg by mouth  Patient not taking: No sig reported 01/14/20   Ar Automatic Reconciliation   tiZANidine (ZANAFLEX) 2 MG tablet Take 2 mg by mouth 3 times daily as needed  Patient not taking: No sig reported 01/11/21   Ar Automatic Reconciliation       No results found for: LIPIDPAN, BMP, CMP     BP 129/71    Pulse 60    Ht 5\' 3"  (1.6 m)    Wt 194 lb (88 kg)    SpO2 93%    BMI 34.37 kg/m??     HPI    Review of Systems   Constitutional:  Negative for activity change, appetite change, diaphoresis, fatigue and unexpected weight change.   Eyes:  Negative for visual disturbance.   Respiratory:  Positive for chest tightness and shortness of breath. Negative for apnea, cough and wheezing.    Cardiovascular:  Positive for chest pain. Negative for palpitations and leg swelling.   Gastrointestinal:  Negative for abdominal pain, blood in stool, constipation, diarrhea and nausea.   Endocrine: Negative for cold intolerance and heat intolerance.   Genitourinary:  Negative for decreased urine volume and difficulty urinating.   Musculoskeletal:  Negative for gait problem, myalgias and neck pain.    Skin:  Negative for color change, pallor and rash.   Neurological:  Negative for dizziness, syncope, facial asymmetry, speech difficulty, weakness, light-headedness and numbness.   Psychiatric/Behavioral:  Negative for confusion and sleep disturbance.        Physical Exam    ASSESSMENT and PLAN  Ms. Dehaven has a reminder for a "due or due soon" health maintenance. I have asked that she contact her  primary care provider for follow-up on this health maintenance.    HLD - Continue pravastatin  po daily, survey lipid panel periodically.      Atrial fibrillation - On coumadin, requests refill, Check for primary prevention of stroke.  Currently not eligible for Noac.      HTN - Normotensive today, metoprolol succinate  po daily,     Chronic heart failure preserved ejection fraction - Start jardiance  po daily, monitor fluid status, adjust as necessary, fluid restrication 2L/day, salt intake <2g per day.  Check 2D transthoracic echocardiogram evaluate for structural heart disease.  Currently on Lasix 40 mg p.o. daily as needed weight gain 2 to 3 pounds    Chest pain -  check nuclear chemical stress test intolerance to exercise evaluate for obstructive coronary artery disease.  Patient advised that if increasing frequency duration and intensity of discomfort or unrelieved discomfort to call EMS to visit emergency department.

## 2021-08-09 ENCOUNTER — Ambulatory Visit: Payer: MEDICAID | Attending: Physical Medicine & Rehabilitation | Primary: Family Medicine

## 2021-08-09 LAB — PROTIME-INR: INR: 2.6
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
## 2021-08-11 NOTE — Patient Instructions (Signed)
Mary [name] is here today for anticoagulation monitoring for the diagnosis of atrial fibrillation.  Her INR goal is 2.5-3.5 and her current Coumadin dose is 5 mg daily.     Today's findings include an INR of 2.6     Considering Mary [name] past history, todays findings, and per the coumadin policy/protocol, Mary [name] was instructed to take Coumadin as follows,  5 mg daily.  She was also instructed to come back in 1 weeks for an INR check.    A full discussion of the nature of anticoagulants has been carried out.  A full discussion of the need for frequent and regular monitoring, precise dosage adjustment and compliance was stressed.  Side effects of potential bleeding were discussed and Mary [name] was instructed to call [phone] if there are any signs of abnormal bleeding.  Mary [name] was instructed to avoid any OTC items containing aspirin or ibuprofen and prior to starting any new OTC products to consult with her physician or pharmacist to ensure no drug interactions are present.  Mary [name] was instructed to avoid any major changes in her general diet and to avoid alcohol consumption.  .      Mary [name] verbalized her understanding of all instructions and will call the office with any questions, concerns, or signs of abnormal bleeding or blood clot.

## 2021-08-17 ENCOUNTER — Ambulatory Visit: Payer: MEDICARE | Primary: Family Medicine

## 2021-08-17 ENCOUNTER — Inpatient Hospital Stay: Admit: 2021-08-17 | Payer: MEDICARE | Primary: Family Medicine

## 2021-08-17 DIAGNOSIS — K439 Ventral hernia without obstruction or gangrene: Secondary | ICD-10-CM

## 2021-08-17 LAB — PROTIME-INR: INR: 2.4

## 2021-08-17 NOTE — Patient Instructions (Signed)
Left on machine

## 2021-08-20 ENCOUNTER — Ambulatory Visit: Admit: 2021-08-20 | Discharge: 2021-08-24 | Payer: MEDICARE | Primary: Family Medicine

## 2021-08-20 ENCOUNTER — Ambulatory Visit: Admit: 2021-08-20 | Discharge: 2021-08-25 | Payer: MEDICARE | Primary: Family Medicine

## 2021-08-20 DIAGNOSIS — R0789 Other chest pain: Secondary | ICD-10-CM

## 2021-08-20 LAB — NM STRESS TEST WITH MYOCARDIAL PERFUSION
Baseline Diastolic BP: 66 mmHg
Baseline HR: 66 {beats}/min
Baseline Systolic BP: 122 mmHg
Body Surface Area: 1.98 m2
Nuc Stress Diastolic Volume Index: 90 mL/m2
Nuc Stress EF: 74 %
Nuc Stress Systolic Volume Index: 23 mL/m2
Recovery Stage 1 HR: 81 {beats}/min
Recovery Stage 2 HR: 74 {beats}/min
Stress Diastolic BP: 70 mmHg
Stress Estimated Workload: 1 METS
Stress Peak HR: 95 {beats}/min
Stress Percent HR Achieved: 57 %
Stress Rate Pressure Product: 12730 bpm*mmHg
Stress Stage 1 HR: 90 {beats}/min
Stress Systolic BP: 134 mmHg
Stress Target HR: 166 {beats}/min
TID: 0.88

## 2021-08-20 LAB — ECHO (TTE) COMPLETE (PRN CONTRAST/BUBBLE/STRAIN/3D)
AR Max Velocity PISA: 4.4 m/s
AV Area by Peak Velocity: 1.8 cm2
AV Peak Gradient: 14 mmHg
AV Peak Velocity: 1.9 m/s
AV VC/LVOT Diameter: 0.28
AV VC/LVOT area: 0.08
AV Velocity Ratio: 0.68
AV Vena Contracta Area: 0.6 cm2
AV Vena Contracta: 0.5 cm
AVA/BSA Peak Velocity: 0.9 cm2/m2
Ao Root Index: 1.62 cm/m2
Aortic Root: 3.1 cm
Ascending Aorta Index: 1.62 cm/m2
Ascending Aorta: 3.1 cm
Body Surface Area: 1.98 m2
Fractional Shortening 2D: 35 % (ref 28–44)
IVSd: 1.1 cm — AB (ref 0.6–0.9)
LA Volume A-L A4C: 63 mL — AB (ref 22–52)
LA Volume A-L A4C: 70 mL — AB (ref 22–52)
LA Volume A/L: 73 mL
LA Volume Index A-L A2C: 37 mL/m2 — AB (ref 16–34)
LA Volume Index A-L A4C: 33 mL/m2 (ref 16–34)
LA Volume Index A/L: 38 mL/m2 (ref 16–34)
LV Mass 2D Index: 115.5 g/m2 — AB (ref 43–95)
LV Mass 2D: 220.6 g — AB (ref 67–162)
LV RWT Ratio: 0.37
LVIDd Index: 2.83 cm/m2
LVIDd: 5.4 cm — AB (ref 3.9–5.3)
LVIDs Index: 1.83 cm/m2
LVIDs: 3.5 cm
LVOT Area: 2.5 cm2
LVOT Diameter: 1.8 cm
LVOT Mean Gradient: 3 mmHg
LVOT Peak Gradient: 6 mmHg
LVOT Peak Velocity: 1.3 m/s
LVOT SV: 70.5 ml
LVOT Stroke Volume Index: 36.9 mL/m2
LVOT VTI: 27.7 cm
LVPWd: 1 cm — AB (ref 0.6–0.9)
MV A Velocity: 0.59 m/s
MV Area by PHT: 2 cm2
MV Area by VTI: 1.1 cm2
MV E Velocity: 1.85 m/s
MV E Wave Deceleration Time: 385.8 ms
MV E/A: 3.14
MV Max Velocity: 2.4 m/s
MV Mean Gradient: 7 mmHg
MV Mean Velocity: 1.2 m/s
MV PHT: 111.9 ms
MV Peak Gradient: 22 mmHg
MV VTI: 65.7 cm
MV:LVOT VTI Index: 2.37
PASP: 30 mmHg
RV Free Wall Peak S': 11 cm/s
RVIDd: 4.3 cm
TAPSE: 2.4 cm (ref 1.7–?)
TR Max Velocity: 2.59 m/s
TR Peak Gradient: 27 mmHg

## 2021-08-20 MED ORDER — REGADENOSON 0.4 MG/5ML IV SOLN
0.4 MG/5ML | Freq: Once | INTRAVENOUS | Status: AC | PRN
Start: 2021-08-20 — End: 2021-08-20
  Administered 2021-08-20: 14:00:00 0.4 mg via INTRAVENOUS

## 2021-08-20 MED ORDER — TECHNETIUM TC 99M TETROFOSMIN IV KIT
Freq: Once | INTRAVENOUS | Status: AC | PRN
Start: 2021-08-20 — End: 2021-08-20
  Administered 2021-08-20: 12:00:00 10.1 via INTRAVENOUS

## 2021-08-20 MED ORDER — TECHNETIUM TC 99M TETROFOSMIN IV KIT
Freq: Once | INTRAVENOUS | Status: AC | PRN
Start: 2021-08-20 — End: 2021-08-20
  Administered 2021-08-20: 14:00:00 30.5 via INTRAVENOUS

## 2021-08-22 ENCOUNTER — Emergency Department: Admit: 2021-08-22 | Payer: MEDICARE | Primary: Family Medicine

## 2021-08-22 ENCOUNTER — Inpatient Hospital Stay
Admit: 2021-08-22 | Discharge: 2021-08-23 | Disposition: A | Discharge status: Home / Self Care | Payer: MEDICARE | Attending: Emergency Medicine

## 2021-08-22 DIAGNOSIS — A4151 Sepsis due to Escherichia coli [E. coli]: Principal | ICD-10-CM

## 2021-08-22 DIAGNOSIS — N390 Urinary tract infection, site not specified: Secondary | ICD-10-CM

## 2021-08-22 MED ORDER — SODIUM CHLORIDE 0.9 % IV SOLN
0.9 % | Freq: Once | INTRAVENOUS | Status: AC
Start: 2021-08-22 — End: 2021-08-23
  Administered 2021-08-23: 30 mL/kg/h via INTRAVENOUS

## 2021-08-22 MED ORDER — IPRATROPIUM-ALBUTEROL 0.5-2.5 (3) MG/3ML IN SOLN
RESPIRATORY_TRACT | Status: AC
Start: 2021-08-22 — End: 2021-08-22
  Administered 2021-08-23: 1 via RESPIRATORY_TRACT

## 2021-08-22 MED FILL — SODIUM CHLORIDE 0.9 % IV SOLN: 0.9 % | INTRAVENOUS | Qty: 1000

## 2021-08-22 NOTE — ED Notes (Signed)
Pt requesting peanut butter and crackers.  Dr Cephus Richer made aware     Clayton Bibles, RN  08/22/21 [phone]

## 2021-08-22 NOTE — ED Notes (Signed)
Received call from lab, 4/4 anaerobic and aerobic bottles with gram negative rods.    Called pt's number, rings, no answer, unable to leave message.    Called emergency contact, does not go through.    [facility] non-emergent PD, spoke with officer Hale Bogus, will perform wellness check and have patient return to the ED.     Mary [name], [place]  08/23/21 [phone]

## 2021-08-22 NOTE — ED Provider Notes (Signed)
Emergency Department Encounter  [facility] EMERGENCY DEPT    Patient: Mary [name]  MRN: [id number]  DOB: 13-Sep-1965  Date of Evaluation: 08/22/2021  ED Provider: Thurston Hole, MD    Chief Complaint       Chief Complaint   Patient presents with    Fever    Flank Pain     HOPI     Mary [name] is a 56 y.o. female who presents to the emergency department with a report from EMS that the patient called with report of having fever since Friday with Tmax 101.7.  The patient first reported chest pain and shortness of breath.  She is found to have respiration rate ranging from 24 to 27 breaths/min with an end-tidal of 31 or EMS.  She had a blood pressure of 85/55.  The patient was given IV fluids with the blood pressure improving to 106/70.  The patient maintained heart rate at 91 per EMS.  The patient was given 324 grams of aspirin in route to the emergency department.  The patient arrives via EMS with by nasal cannula oxygen with saturations in the mid to high 90s.    ROS:     At least 10 systems reviewed and otherwise acutely negative except as in the HOPI.    Past History     Past Medical History:   Diagnosis Date    Anxiety     Bipolar 1 disorder (HCC)     CHF (congestive heart failure) (HCC)     COPD (chronic obstructive pulmonary disease) (HCC)     COPD (chronic obstructive pulmonary disease) (HCC)     H/O aortic valve replacement     PTSD (post-traumatic stress disorder)      Past Surgical History:   Procedure Laterality Date    CARDIAC CATHETERIZATION      CARDIAC VALVE SURGERY      mitral valve replacement    HAND/FINGER SURGERY UNLISTED Right     I&D right middle finger     Social History     Socioeconomic History    Marital status: Divorced   Tobacco Use    Smoking status: Every Day     Packs/day: 1.00     Types: Cigarettes    Smokeless tobacco: Never   Vaping Use    Vaping Use: Never used   Substance and Sexual Activity    Alcohol use: Never    Drug use: Yes     Types: Marijuana Sheran Fava)       Medications/Allergies      Previous Medications    ALBUTEROL SULFATE HFA (PROVENTIL;VENTOLIN;PROAIR) 108 (90 BASE) MCG/ACT INHALER    Inhale 2 puffs into the lungs every 6 hours as needed    ALBUTEROL-IPRATROPIUM (COMBIVENT RESPIMAT) 20-100 MCG/ACT AERS INHALER    Inhale 1 puff into the lungs 4 times daily    ASPIRIN 81 MG CHEWABLE TABLET    Take 81 mg by mouth daily    CETIRIZINE (ZYRTEC) 10 MG TABLET    Take 10 mg by mouth daily    CYANOCOBALAMIN 1000 MCG TABLET    cyanocobalamin (vit B-12) 1,000 mcg tablet   take 1 tablet by mouth daily as directed    DOCUSATE (COLACE, DULCOLAX) 100 MG CAPS    Col-Rite 100 mg capsule   take 1 capsule by mouth twice a day if needed for constipation    EMPAGLIFLOZIN (JARDIANCE) 10 MG TABLET    Take 1 tablet by mouth daily    FLUTICASONE (FLONASE) 50  MCG/ACT NASAL SPRAY    1 spray by nasal route daily    FUROSEMIDE (LASIX) 40 MG TABLET    Take 40 mg by mouth daily as needed    HYDROXYZINE HCL (ATARAX) 25 MG TABLET    Take 25 mg by mouth 2 times daily as needed    LAMOTRIGINE (LAMICTAL) 25 MG TABLET    Take 75 mg by mouth daily    METOPROLOL SUCCINATE (TOPROL XL) 25 MG EXTENDED RELEASE TABLET    Take 1 tablet by mouth daily    OMEPRAZOLE (PRILOSEC) 40 MG DELAYED RELEASE CAPSULE    Take 40 mg by mouth daily    PRAVASTATIN (PRAVACHOL) 40 MG TABLET    Take 1 tablet by mouth daily    PREGABALIN (LYRICA) 200 MG CAPSULE    take 1 capsule by mouth three times a day    TIZANIDINE (ZANAFLEX) 2 MG TABLET    Take 2 mg by mouth 3 times daily as needed    WARFARIN (COUMADIN) 2.5 MG TABLET    Take 2 tablets by mouth daily     Allergies   Allergen Reactions    Alprazolam Other (See Comments)     Gets suicidal    Diazepam Other (See Comments)     Gets suicidal    Tobacco     Adhesive Tape Rash    Lithium Nausea And Vomiting        Physical Exam       ED Triage Vitals [08/22/21 1948]   BP Temp Temp Source Heart Rate Resp SpO2 Height Weight   91/60 (!) 101 F (38.3 C) Oral 89 21 97 % -- --     GENERAL APPEARANCE: Awake and  alert. Cooperative. No acute distress.  No obvious discomfort.  HEAD: Normocephalic. Atraumatic.   EYES: Sclera anicteric.  PERRL.  EOMI.  ENT: Tolerates saliva. No trismus.   NECK: Supple. Trachea midline.  Lateral JVD.  No audible stridor or carotid bruit.  CARDIO: RRR. Radial pulse 2+.  No audible murmur.  LUNGS: Respirations unlabored. CTAB and symmetrical good air movement except for coarse breath sounds at the right base.  ABDOMEN: Soft. Non-distended. Non-tender.    EXTREMITIES: No acute deformities.  No clubbing, edema, or cyanosis.  SKIN: Warm and dry.  No obvious lesions or discolorations.  NEUROLOGICAL: No gross facial drooping. Moves all 4 extremities spontaneously.  Patient is awake alert and oriented x3.  There is no gross motor or sensory deficits.  Cranial nerves III through XII grossly intact.  Speech is clear.  PSYCHIATRIC: Normal mood.     Diagnostics   Labs:  Results for orders placed or performed during the hospital encounter of 08/22/21   Respiratory Panel, Molecular, with COVID-19 (Restricted: peds pts or suitable admitted adults)    Specimen: Nasopharyngeal   Result Value Ref Range    Adenovirus by PCR Not detected NOTD      Coronavirus 229E by PCR Not detected NOTD      Coronavirus HKU1 by PCR Not detected NOTD      Coronavirus NL63 by PCR Not detected NOTD      Coronavirus OC43 by PCR Not detected NOTD      SARS-CoV-2, PCR Not detected NOTD      Human Metapneumovirus by PCR Not detected NOTD      Rhinovirus Enterovirus PCR Not detected NOTD      Influenza A by PCR Not detected NOTD      Influenza B PCR Not detected NOTD  Parainfluenza 1 PCR Not detected NOTD      Parainfluenza 2 PCR Not detected NOTD      Parainfluenza 3 PCR Not detected NOTD      Parainfluenza 4 PCR Not detected NOTD      Respiratory Syncytial Virus by PCR Not detected NOTD      Bordetella parapertussis by PCR Not detected NOTD      Bordetella pertussis by PCR Not detected NOTD      Chlamydophila Pneumonia PCR Not  detected NOTD      Mycoplasma pneumo by PCR Not detected NOTD     Lactate, Sepsis   Result Value Ref Range    Lactic Acid, Sepsis 2.0 0.4 - 2.0 MMOL/L   CBC with Auto Differential   Result Value Ref Range    WBC 12.0 4.6 - 13.2 K/uL    RBC 4.35 4.20 - 5.30 M/uL    Hemoglobin 13.3 12.0 - 16.0 g/dL    Hematocrit 16.1 09.6 - 45.0 %    MCV 90.8 78.0 - 100.0 FL    MCH 30.6 24.0 - 34.0 PG    MCHC 33.7 31.0 - 37.0 g/dL    RDW 04.5 40.9 - 81.1 %    Platelets 147 135 - 420 K/uL    MPV 10.9 9.2 - 11.8 FL    Nucleated RBCs 0.0 0 PER 100 WBC    nRBC 0.00 0.00 - 0.01 K/uL    Seg Neutrophils 81 (H) 40 - 73 %    Lymphocytes 11 (L) 21 - 52 %    Monocytes 7 3 - 10 %    Eosinophils % 1 0 - 5 %    Basophils 0 0 - 2 %    Immature Granulocytes 1 (H) 0.0 - 0.5 %    Segs Absolute 9.6 (H) 1.8 - 8.0 K/UL    Absolute Lymph # 1.3 0.9 - 3.6 K/UL    Absolute Mono # 0.8 0.05 - 1.2 K/UL    Absolute Eos # 0.1 0.0 - 0.4 K/UL    Basophils Absolute 0.0 0.0 - 0.1 K/UL    Absolute Immature Granulocyte 0.1 (H) 0.00 - 0.04 K/UL    Differential Type AUTOMATED     CMP   Result Value Ref Range    Sodium 139 136 - 145 mmol/L    Potassium 2.7 (LL) 3.5 - 5.5 mmol/L    Chloride 108 100 - 111 mmol/L    CO2 24 21 - 32 mmol/L    Anion Gap 7 3.0 - 18 mmol/L    Glucose 125 (H) 74 - 99 mg/dL    BUN 25 (H) 7.0 - 18 MG/DL    Creatinine 9.14 (H) 0.6 - 1.3 MG/DL    Bun/Cre Ratio 14 12 - 20      Est, Glom Filt Rate 35 (L) >60 ml/min/1.60m2    Calcium 7.9 (L) 8.5 - 10.1 MG/DL    Total Bilirubin 0.7 0.2 - 1.0 MG/DL    ALT 17 13 - 56 U/L    AST 13 10 - 38 U/L    Alk Phosphatase 70 45 - 117 U/L    Total Protein 6.4 6.4 - 8.2 g/dL    Albumin 2.8 (L) 3.4 - 5.0 g/dL    Globulin 3.6 2.0 - 4.0 g/dL    Albumin/Globulin Ratio 0.8 0.8 - 1.7     Urinalysis   Result Value Ref Range    Color, UA DARK YELLOW      Appearance CLOUDY  Specific Gravity, UA 1.024 1.005 - 1.030      pH, Urine 5.0 5.0 - 8.0      Protein, UA 100 (A) NEG mg/dL    Glucose, UA Negative NEG mg/dL    Ketones,  Urine TRACE (A) NEG mg/dL    Bilirubin Urine SMALL (A) NEG      Blood, Urine LARGE (A) NEG      Urobilinogen, Urine 1.0 0.2 - 1.0 EU/dL    Nitrite, Urine Negative NEG      Leukocyte Esterase, Urine MODERATE (A) NEG     Troponin   Result Value Ref Range    Troponin, High Sensitivity 6 0 - 54 ng/L   Magnesium   Result Value Ref Range    Magnesium 1.7 1.6 - 2.6 mg/dL   Protime-INR   Result Value Ref Range    Protime 34.7 (H) 11.5 - 15.2 sec    INR 3.4 (H) 0.8 - 1.2     Brain Natriuretic Peptide   Result Value Ref Range    NT Pro-BNP 2,189 (H) 0 - 900 PG/ML   Urinalysis, Micro   Result Value Ref Range    WBC, UA TOO NUMEROUS TO COUNT 0 - 4 /hpf    RBC, UA 11 to 20 0 - 5 /hpf    Epithelial Cells UA 1+ 0 - 5 /lpf    BACTERIA, URINE 2+ (A) NEG /hpf   Basic Metabolic Panel   Result Value Ref Range    Sodium 137 136 - 145 mmol/L    Potassium 3.5 3.5 - 5.5 mmol/L    Chloride 113 (H) 100 - 111 mmol/L    CO2 18 (L) 21 - 32 mmol/L    Anion Gap 6 3.0 - 18 mmol/L    Glucose 105 (H) 74 - 99 mg/dL    BUN 22 (H) 7.0 - 18 MG/DL    Creatinine 1.61 0.6 - 1.3 MG/DL    Bun/Cre Ratio 17 12 - 20      Est, Glom Filt Rate 49 (L) >60 ml/min/1.49m2    Calcium 7.1 (L) 8.5 - 10.1 MG/DL   POC G3: BLOOD GASES INCLUDES CALC. TCO2, HCO3, BASE EXCESS, SO2   Result Value Ref Range    DEVICE CPAP      FIO2 3 %    pH, Arterial, POC 7.38 7.35 - 7.45      pCO2, Arterial, POC 34.2 (L) 35.0 - 45.0 MMHG    pO2, Arterial, POC 79 (L) 80 - 100 MMHG    HCO3, Mixed 20.4 (L) 22 - 26 MMOL/L    SO2c, Arterial, POC 95.5 92 - 97 %    BASE DEFICIT (POC) 4.0 mmol/L    POC Allen's Test NOT APPLICABLE      Site RIGHT BRACHIAL      Specimen type: ARTERIAL      Performed by: Valerie Salts      Radiographs:  chest X-ray: No acute consolidation noted on my review.      Procedures/EKG:   EKG Interpretation    Interpreted by emergency department physician    Rhythm: normal sinus   Rate: 88 bpm  Axis: normal  Ectopy: none  Conduction: normal  ST Segments: ST segment depression  in the inferior lateral leads  T Waves: non specific changes  Q Waves: none    Clinical Impression: Normal sinus rhythm with no acute changes when compared to previous twelve-lead EKG    Thurston Hole, MD      ED Course  and MDM   In brief, Mary [name] is a 56 y.o. female who presented to the emergency department with report of fever and generalized weakness with hypotension.  The patient's lactic acid was within normal limits.  The patient's fever resolved after antipyretics.  Her COVID-19.  The have urinary tract infection likely contributing to fever and generalized malaise.  The patient also had hypokalemia and hypocalcemia that were treated with supplements emergency department.  The patient's anxiety that she expressed was treated with Vistaril.  After being monitored in the emergency department the patient's vital signs were stable.  The patient elected to be discharged home.  She is strongly urged to follow-up closely with her primary care provider and take medications as prescribed.  She is to return immediately to the emergency department any worsening or concerning symptoms.  She is to call and/or ibuprofen as needed as directed on the container.    ED Medication Orders (From admission, onward)      Start Ordered     Status Ordering Provider    08/23/21 0215 08/23/21 0153  calcium gluconate 10 % injection 1,000 mg  NOW         Ordered Anjeli Casad M    08/23/21 0215 08/23/21 0154  hydrOXYzine pamoate (VISTARIL) capsule 25 mg  NOW         Ordered Johni Narine M    08/23/21 0045 08/23/21 0025  cefTRIAXone (ROCEPHIN) 1,000 mg in sterile water 10 mL IV syringe  NOW        Question:  Antimicrobial Indications  Answer:  Urinary Tract Infection    Last MAR action: Given - by Ginnie Smart on 08/23/21 at 0051 Thurston Hole M    08/22/21 2130 08/22/21 2101  potassium chloride (KLOR-CON M) extended release tablet 40 mEq  ONCE         Last MAR action: Given - by Ginnie Smart on 08/22/21 at 2140 Thurston Hole  M    08/22/21 2030 08/22/21 2000  acetaminophen (TYLENOL) tablet 1,000 mg  NOW         Last MAR action: Given - by Ginnie Smart on 08/22/21 at 2015 [place], Shaina Gullatt M    08/22/21 2015 08/22/21 1947  0.9 % sodium chloride infusion  ONCE         Last MAR action: New Bag - by Ginnie Smart on 08/22/21 at 2009 Thurston Hole M    08/22/21 2015 08/22/21 1948  ipratropium-albuterol (DUONEB) nebulizer solution 1 ampule  NOW        Question:  Initiate RT Bronchodilator Protocol  Answer:  Yes - Inpatient Protocol    Last MAR action: Given - by Ginnie Smart on 08/22/21 at 2015 Thurston Hole M            Final Impression      1. Urinary tract infection without hematuria, site unspecified    2. Hypokalemia    3. Anxiety state    4. Hypocalcemia      DISPOSITION Decision To Discharge 08/23/2021 01:54:32 AM     (Please note that portions of this note may have been completed with a voice recognition program. Efforts were made to edit the dictations but occasionally words are mis-transcribed.)    Thurston Hole, MD  [employer]        Norvel Richards, MD  09/28/21 [phone]

## 2021-08-22 NOTE — ED Triage Notes (Signed)
Pt here via EMS with c/o SOB, fever, and cloudy urine.  Pt state she have lower back pain.    Hx of cardiac issues, COPD  Received 324 ASA in route.  Last taken tylenol at 330pm.

## 2021-08-22 NOTE — ED Notes (Signed)
Pt disconnected herself from the monitor and approached this tech at the nurses station requesting food and water, pt states that she is having frequent urine output and it's painful.  RN made aware.     Jerrel [facility]  08/22/21 2340

## 2021-08-22 NOTE — Progress Notes (Signed)
Respiratory Note:    ABG done. Dr. Cephus Richer Noted and no changes at this time.

## 2021-08-23 ENCOUNTER — Emergency Department: Admit: 2021-08-23 | Payer: MEDICARE | Primary: Family Medicine

## 2021-08-23 ENCOUNTER — Inpatient Hospital Stay: Admit: 2021-08-24 | Payer: MEDICARE | Primary: Family Medicine

## 2021-08-23 ENCOUNTER — Inpatient Hospital Stay: Payer: MEDICARE | Primary: Family Medicine

## 2021-08-23 ENCOUNTER — Inpatient Hospital Stay
Admission: EM | Admit: 2021-08-23 | Discharge: 2021-08-28 | Disposition: A | Discharge status: Home Health | Payer: MEDICARE | Admitting: Family Medicine

## 2021-08-23 ENCOUNTER — Inpatient Hospital Stay
Admit: 2021-08-23 | Discharge: 2021-08-23 | Disposition: A | Discharge status: Home / Self Care | Payer: MEDICARE | Attending: Emergency Medicine

## 2021-08-23 DIAGNOSIS — N3001 Acute cystitis with hematuria: Secondary | ICD-10-CM

## 2021-08-23 LAB — CULTURE, BLOOD, PCR ID PANEL RESULTS REPORT
Acinetobacter calcoac baumannii complex by PCR: NOT DETECTED
Bacteroides fragilis by PCR: NOT DETECTED
Candida albicans by PCR: NOT DETECTED
Candida auris by PCR: NOT DETECTED
Candida glabrata: NOT DETECTED
Candida krusei by PCR: NOT DETECTED
Candida parapsilosis by PCR: NOT DETECTED
Candida tropicalis by PCR: NOT DETECTED
Colistin Resistance mcr-1 gene by PCR: NOT DETECTED
Cryptococcus neoformans/gattii by PCR: NOT DETECTED
Enterobacter cloacae complex by PCR: NOT DETECTED
Enterobacteriaceae by PCR: DETECTED — AB
Enterococcus faecalis by PCR: NOT DETECTED
Enterococcus faecium by PCR: NOT DETECTED
Escherichia Coli: DETECTED — AB
Haemophilus Influenzae by PCR: NOT DETECTED
KPC (Carbapenem resistance gene): NOT DETECTED
Klebsiella aerogenes by PCR: NOT DETECTED
Klebsiella oxytoca by PCR: NOT DETECTED
Klebsiella pneumoniae group by PCR: NOT DETECTED
Listeria monocytogenes by PCR: NOT DETECTED
Neisseria meningitidis by PCR: NOT DETECTED
Proteus by PCR: NOT DETECTED
Pseudomonas aeruginosa: NOT DETECTED
Resistant gene ctx-m by PCR: NOT DETECTED
Resistant gene imp by PCR: NOT DETECTED
Resistant gene ndm by PCR: NOT DETECTED
Resistant gene oxa-48-like by pcr: NOT DETECTED
Resistant gene vim by PCR: NOT DETECTED
STAPHYLOCOCCUS: NOT DETECTED
STREPTOCOCCUS: NOT DETECTED
Salmonella species by PCR: NOT DETECTED
Serratia marcescens by PCR: NOT DETECTED
Staphylococcus Aureus: NOT DETECTED
Staphylococcus epidermidis by PCR: NOT DETECTED
Staphylococcus lugdunensis by PCR: NOT DETECTED
Stenotrophomonas maltophilia by PCR: NOT DETECTED
Strep pneumoniae: NOT DETECTED
Strep pyogenes,(Grp. A): NOT DETECTED
Streptococcus agalactiae (Group B): NOT DETECTED

## 2021-08-23 LAB — URINALYSIS
Bilirubin Urine: NEGATIVE
Glucose, UA: NEGATIVE mg/dL
Glucose, UA: NEGATIVE mg/dL
Ketones, Urine: NEGATIVE mg/dL
Nitrite, Urine: NEGATIVE
Nitrite, Urine: NEGATIVE
Protein, UA: 100 mg/dL — AB
Protein, UA: 30 mg/dL — AB
Specific Gravity, UA: 1.024 (ref 1.005–1.030)
Specific Gravity, UA: 1.021 (ref 1.005–1.030)
Urobilinogen, Urine: 1 EU/dL (ref 0.2–1.0)
Urobilinogen, Urine: 1 EU/dL (ref 0.2–1.0)
pH, Urine: 5 (ref 5.0–8.0)
pH, Urine: 6.5 (ref 5.0–8.0)

## 2021-08-23 LAB — CBC WITH AUTO DIFFERENTIAL
Absolute Immature Granulocyte: 0 10*3/uL (ref 0.00–0.04)
Basophils %: 0 % (ref 0–2)
Basophils %: 0 % (ref 0–2)
Basophils %: 0 % (ref 0–2)
Basophils Absolute: 0 10*3/uL (ref 0.0–0.1)
Basophils Absolute: 0 10*3/uL (ref 0.0–0.1)
Basophils Absolute: 0 10*3/uL (ref 0.0–0.1)
Eosinophils %: 1 % (ref 0–5)
Eosinophils %: 0 % (ref 0–5)
Eosinophils %: 1 % (ref 0–5)
Eosinophils Absolute: 0.1 10*3/uL (ref 0.0–0.4)
Eosinophils Absolute: 0 10*3/uL (ref 0.0–0.4)
Eosinophils Absolute: 0 10*3/uL (ref 0.0–0.4)
Hematocrit: 39.5 % (ref 35.0–45.0)
Hematocrit: 35.9 % (ref 35.0–45.0)
Hematocrit: 34.4 % — ABNORMAL LOW (ref 35.0–45.0)
Hemoglobin: 13.3 g/dL (ref 12.0–16.0)
Hemoglobin: 12.4 g/dL (ref 12.0–16.0)
Hemoglobin: 11.9 g/dL — ABNORMAL LOW (ref 12.0–16.0)
Immature Granulocytes %: 1 % — ABNORMAL HIGH (ref 0.0–0.5)
Immature Granulocytes %: 1 % — ABNORMAL HIGH (ref 0.0–0.5)
Immature Granulocytes Absolute: 0.1 10*3/uL — ABNORMAL HIGH (ref 0.00–0.04)
Immature Granulocytes Absolute: 0.1 10*3/uL — ABNORMAL HIGH (ref 0.00–0.04)
Immature Granulocytes: 1 % — ABNORMAL HIGH (ref 0.0–0.5)
Lymphocytes %: 11 % — ABNORMAL LOW (ref 21–52)
Lymphocytes %: 6 % — ABNORMAL LOW (ref 21–52)
Lymphocytes %: 5 % — ABNORMAL LOW (ref 21–52)
Lymphocytes Absolute: 1.3 10*3/uL (ref 0.9–3.6)
Lymphocytes Absolute: 0.6 10*3/uL — ABNORMAL LOW (ref 0.9–3.6)
Lymphocytes Absolute: 0.4 10*3/uL — ABNORMAL LOW (ref 0.9–3.6)
MCH: 30.6 PG (ref 24.0–34.0)
MCH: 30.3 PG (ref 24.0–34.0)
MCH: 29.9 PG (ref 24.0–34.0)
MCHC: 33.7 g/dL (ref 31.0–37.0)
MCHC: 34.5 g/dL (ref 31.0–37.0)
MCHC: 34.6 g/dL (ref 31.0–37.0)
MCV: 90.8 FL (ref 78.0–100.0)
MCV: 87.8 FL (ref 78.0–100.0)
MCV: 86.4 FL (ref 78.0–100.0)
MPV: 10.9 FL (ref 9.2–11.8)
MPV: 11.2 FL (ref 9.2–11.8)
MPV: 11.2 FL (ref 9.2–11.8)
Monocytes %: 7 % (ref 3–10)
Monocytes %: 6 % (ref 3–10)
Monocytes %: 7 % (ref 3–10)
Monocytes Absolute: 0.8 10*3/uL (ref 0.05–1.2)
Monocytes Absolute: 0.6 10*3/uL (ref 0.05–1.2)
Monocytes Absolute: 0.6 10*3/uL (ref 0.05–1.2)
Neutrophils %: 81 % — ABNORMAL HIGH (ref 40–73)
Neutrophils %: 86 % — ABNORMAL HIGH (ref 40–73)
Neutrophils %: 87 % — ABNORMAL HIGH (ref 40–73)
Neutrophils Absolute: 9.6 10*3/uL — ABNORMAL HIGH (ref 1.8–8.0)
Neutrophils Absolute: 7.8 10*3/uL (ref 1.8–8.0)
Neutrophils Absolute: 6.9 10*3/uL (ref 1.8–8.0)
Nucleated RBCs: 0 PER 100 WBC
Nucleated RBCs: 0 PER 100 WBC
Nucleated RBCs: 0 PER 100 WBC
Platelets: 147 10*3/uL (ref 135–420)
Platelets: 125 10*3/uL — ABNORMAL LOW (ref 135–420)
Platelets: 123 10*3/uL — ABNORMAL LOW (ref 135–420)
RBC: 4.35 M/uL (ref 4.20–5.30)
RBC: 4.09 M/uL — ABNORMAL LOW (ref 4.20–5.30)
RBC: 3.98 M/uL — ABNORMAL LOW (ref 4.20–5.30)
RDW: 14.1 % (ref 11.6–14.5)
RDW: 14.2 % (ref 11.6–14.5)
RDW: 14.4 % (ref 11.6–14.5)
WBC: 12 10*3/uL (ref 4.6–13.2)
WBC: 9.1 10*3/uL (ref 4.6–13.2)
WBC: 7.9 10*3/uL (ref 4.6–13.2)
nRBC: 0 10*3/uL (ref 0.00–0.01)
nRBC: 0 10*3/uL (ref 0.00–0.01)
nRBC: 0 10*3/uL (ref 0.00–0.01)

## 2021-08-23 LAB — RESPIRATORY PANEL, MOLECULAR, WITH COVID-19

## 2021-08-23 LAB — LACTATE, SEPSIS
Lactic Acid, Sepsis: 2 MMOL/L (ref 0.4–2.0)
Lactic Acid, Sepsis: 1.1 MMOL/L (ref 0.4–2.0)
Lactic Acid, Sepsis: 1.5 MMOL/L (ref 0.4–2.0)

## 2021-08-23 LAB — COMPREHENSIVE METABOLIC PANEL
ALT: 17 U/L (ref 13–56)
ALT: 20 U/L (ref 13–56)
AST: 13 U/L (ref 10–38)
AST: 20 U/L (ref 10–38)
Albumin/Globulin Ratio: 0.8 (ref 0.8–1.7)
Albumin/Globulin Ratio: 1 (ref 0.8–1.7)
Albumin: 2.8 g/dL — ABNORMAL LOW (ref 3.4–5.0)
Albumin: 2.9 g/dL — ABNORMAL LOW (ref 3.4–5.0)
Alk Phosphatase: 70 U/L (ref 45–117)
Alk Phosphatase: 87 U/L (ref 45–117)
Anion Gap: 7 mmol/L (ref 3.0–18)
Anion Gap: 9 mmol/L (ref 3.0–18)
BUN/Creatinine Ratio: 14 (ref 12–20)
BUN/Creatinine Ratio: 14 (ref 12–20)
BUN: 25 MG/DL — ABNORMAL HIGH (ref 7.0–18)
BUN: 21 MG/DL — ABNORMAL HIGH (ref 7.0–18)
CO2: 24 mmol/L (ref 21–32)
CO2: 19 mmol/L — ABNORMAL LOW (ref 21–32)
Calcium: 7.9 MG/DL — ABNORMAL LOW (ref 8.5–10.1)
Calcium: 7.7 MG/DL — ABNORMAL LOW (ref 8.5–10.1)
Chloride: 108 mmol/L (ref 100–111)
Chloride: 110 mmol/L (ref 100–111)
Creatinine: 1.73 MG/DL — ABNORMAL HIGH (ref 0.6–1.3)
Creatinine: 1.49 MG/DL — ABNORMAL HIGH (ref 0.6–1.3)
Est, Glom Filt Rate: 35 mL/min/{1.73_m2} — ABNORMAL LOW (ref >60)
Est, Glom Filt Rate: 41 mL/min/{1.73_m2} — ABNORMAL LOW (ref >60)
Globulin: 3.6 g/dL (ref 2.0–4.0)
Globulin: 2.9 g/dL (ref 2.0–4.0)
Glucose: 125 mg/dL — ABNORMAL HIGH (ref 74–99)
Glucose: 99 mg/dL (ref 74–99)
Potassium: 2.7 mmol/L — CL (ref 3.5–5.5)
Potassium: 3.2 mmol/L — ABNORMAL LOW (ref 3.5–5.5)
Sodium: 139 mmol/L (ref 136–145)
Sodium: 138 mmol/L (ref 136–145)
Total Bilirubin: 0.7 MG/DL (ref 0.2–1.0)
Total Bilirubin: 0.6 MG/DL (ref 0.2–1.0)
Total Protein: 6.4 g/dL (ref 6.4–8.2)
Total Protein: 5.8 g/dL — ABNORMAL LOW (ref 6.4–8.2)

## 2021-08-23 LAB — URINALYSIS, MICRO
RBC, UA: 11 /hpf (ref 0–5)
RBC, UA: 12 /hpf (ref 0–5)
WBC, UA: 6 /hpf (ref 0–4)

## 2021-08-23 LAB — BASIC METABOLIC PANEL
Anion Gap: 6 mmol/L (ref 3.0–18)
Anion Gap: 6 mmol/L (ref 3.0–18)
BUN/Creatinine Ratio: 17 (ref 12–20)
BUN: 22 MG/DL — ABNORMAL HIGH (ref 7.0–18)
BUN: 20 MG/DL — ABNORMAL HIGH (ref 7.0–18)
Bun/Cre Ratio: 13 (ref 12–20)
CO2: 18 mmol/L — ABNORMAL LOW (ref 21–32)
CO2: 20 mmol/L — ABNORMAL LOW (ref 21–32)
Calcium: 7.1 MG/DL — ABNORMAL LOW (ref 8.5–10.1)
Calcium: 7.7 MG/DL — ABNORMAL LOW (ref 8.5–10.1)
Chloride: 113 mmol/L — ABNORMAL HIGH (ref 100–111)
Chloride: 114 mmol/L — ABNORMAL HIGH (ref 100–111)
Creatinine: 1.3 MG/DL (ref 0.6–1.3)
Creatinine: 1.57 MG/DL — ABNORMAL HIGH (ref 0.6–1.3)
Est, Glom Filt Rate: 49 mL/min/{1.73_m2} — ABNORMAL LOW (ref >60)
Est, Glom Filt Rate: 39 mL/min/{1.73_m2} — ABNORMAL LOW (ref >60)
Glucose: 105 mg/dL — ABNORMAL HIGH (ref 74–99)
Glucose: 99 mg/dL (ref 74–99)
Potassium: 3.5 mmol/L (ref 3.5–5.5)
Potassium: 4 mmol/L (ref 3.5–5.5)
Sodium: 137 mmol/L (ref 136–145)
Sodium: 140 mmol/L (ref 136–145)

## 2021-08-23 LAB — EKG 12-LEAD
Atrial Rate: 88 {beats}/min
Atrial Rate: 102 {beats}/min
Diagnosis: NORMAL
P Axis: 49 degrees
P Axis: 91 degrees
P-R Interval: 142 ms
P-R Interval: 136 ms
Q-T Interval: 380 ms
Q-T Interval: 336 ms
QRS Duration: 90 ms
QRS Duration: 86 ms
QTc Calculation (Bazett): 459 ms
QTc Calculation (Bazett): 437 ms
R Axis: 43 degrees
R Axis: 33 degrees
T Axis: 93 degrees
T Axis: 90 degrees
Ventricular Rate: 88 {beats}/min
Ventricular Rate: 102 {beats}/min

## 2021-08-23 LAB — POC G3: BLOOD GASES INCLUDES CALC. TCO2, HCO3, BASE EXCESS, SO2
Base Deficit (POC): 4 mmol/L
FIO2: 3 %
HCO3, Mixed: 20.4 MMOL/L — ABNORMAL LOW (ref 22–26)
SO2c, Arterial, POC: 95.5 % (ref 92–97)
pCO2, Arterial, POC: 34.2 MMHG — ABNORMAL LOW (ref 35.0–45.0)
pH, Arterial, POC: 7.38 (ref 7.35–7.45)
pO2, Arterial, POC: 79 MMHG — ABNORMAL LOW (ref 80–100)

## 2021-08-23 LAB — COVID-19 & INFLUENZA COMBO
Rapid Influenza A By PCR: NOT DETECTED
Rapid Influenza B By PCR: NOT DETECTED
SARS-CoV-2, PCR: NOT DETECTED

## 2021-08-23 LAB — POCT GLUCOSE
POC Glucose: 94 mg/dL (ref 70–110)
QC OK?: 94

## 2021-08-23 LAB — TROPONIN
Troponin, High Sensitivity: 6 ng/L (ref 0–54)
Troponin, High Sensitivity: 10 ng/L (ref 0–54)

## 2021-08-23 LAB — PROTIME-INR
INR: 3.4 — ABNORMAL HIGH (ref 0.8–1.2)
Protime: 34.7 s — ABNORMAL HIGH (ref 11.5–15.2)

## 2021-08-23 LAB — BRAIN NATRIURETIC PEPTIDE: NT Pro-BNP: 2189 PG/ML — ABNORMAL HIGH (ref 0–900)

## 2021-08-23 LAB — MAGNESIUM: Magnesium: 1.7 mg/dL (ref 1.6–2.6)

## 2021-08-23 LAB — PROCALCITONIN: Procalcitonin: 3.7 ng/mL

## 2021-08-23 MED ORDER — CITALOPRAM HYDROBROMIDE 20 MG PO TABS
20 MG | Freq: Once | ORAL | Status: AC
Start: 2021-08-23 — End: 2021-08-23
  Administered 2021-08-23: 18:00:00 20 mg via ORAL

## 2021-08-23 MED ORDER — ONDANSETRON HCL 4 MG/2ML IJ SOLN
4 MG/2ML | Freq: Four times a day (QID) | INTRAMUSCULAR | Status: AC | PRN
Start: 2021-08-23 — End: 2021-08-28

## 2021-08-23 MED ORDER — PREGABALIN 50 MG PO CAPS
50 MG | ORAL | Status: AC
Start: 2021-08-23 — End: 2021-08-23
  Administered 2021-08-23: 18:00:00 200 mg via ORAL

## 2021-08-23 MED ORDER — STERILE WATER FOR INJECTION (MIXTURES ONLY)
1 g | INTRAMUSCULAR | Status: AC
Start: 2021-08-23 — End: 2021-08-23
  Administered 2021-08-23: 05:00:00 1000 mg via INTRAVENOUS

## 2021-08-23 MED ORDER — ENOXAPARIN SODIUM 40 MG/0.4ML IJ SOSY
40 MG/0.4ML | Freq: Every day | INTRAMUSCULAR | Status: DC
Start: 2021-08-23 — End: 2021-08-23

## 2021-08-23 MED ORDER — ACETAMINOPHEN 325 MG PO TABS
325 | Freq: Four times a day (QID) | ORAL | Status: DC | PRN
Start: 2021-08-23 — End: 2021-08-28
  Administered 2021-08-24 – 2021-08-28 (×12): 650 mg via ORAL

## 2021-08-23 MED ORDER — ONDANSETRON 4 MG PO TBDP
4 MG | Freq: Three times a day (TID) | ORAL | Status: AC | PRN
Start: 2021-08-23 — End: 2021-08-28

## 2021-08-23 MED ORDER — POLYETHYLENE GLYCOL 3350 17 G PO PACK
17 g | Freq: Every day | ORAL | Status: AC | PRN
Start: 2021-08-23 — End: 2021-08-28

## 2021-08-23 MED ORDER — CEPHALEXIN 500 MG PO CAPS
500 MG | ORAL_CAPSULE | Freq: Four times a day (QID) | ORAL | 0 refills | Status: DC
Start: 2021-08-23 — End: 2021-08-25

## 2021-08-23 MED ORDER — SODIUM CHLORIDE 0.9 % IV SOLN
0.9 % | INTRAVENOUS | Status: AC | PRN
Start: 2021-08-23 — End: 2021-08-28

## 2021-08-23 MED ORDER — ACETAMINOPHEN 650 MG RE SUPP
650 | Freq: Four times a day (QID) | RECTAL | Status: DC | PRN
Start: 2021-08-23 — End: 2021-08-28

## 2021-08-23 MED ORDER — SODIUM CHLORIDE 0.9 % IV SOLN
0.9 % | INTRAVENOUS | Status: DC
Start: 2021-08-23 — End: 2021-08-24
  Administered 2021-08-23: 23:00:00 via INTRAVENOUS

## 2021-08-23 MED ORDER — CALCIUM GLUCONATE 10 % IV SOLN
10 % | INTRAVENOUS | Status: AC
Start: 2021-08-23 — End: 2021-08-23
  Administered 2021-08-23: 06:00:00 1000 mg via INTRAVENOUS

## 2021-08-23 MED ORDER — NORMAL SALINE FLUSH 0.9 % IV SOLN
0.9 % | Freq: Two times a day (BID) | INTRAVENOUS | Status: DC
Start: 2021-08-23 — End: 2021-08-23

## 2021-08-23 MED ORDER — IOPAMIDOL 61 % IV SOLN
61 % | Freq: Once | INTRAVENOUS | Status: AC | PRN
Start: 2021-08-23 — End: 2021-08-23
  Administered 2021-08-23: 15:00:00 70 mL via INTRAVENOUS

## 2021-08-23 MED ORDER — LORAZEPAM 0.5 MG PO TABS
0.5 MG | ORAL | Status: DC | PRN
Start: 2021-08-23 — End: 2021-08-23
  Administered 2021-08-23: 18:00:00 0.5 mg via ORAL

## 2021-08-23 MED ORDER — ACETAMINOPHEN 500 MG PO TABS
500 MG | ORAL | Status: AC
Start: 2021-08-23 — End: 2021-08-22
  Administered 2021-08-23: 1000 mg via ORAL

## 2021-08-23 MED ORDER — LORAZEPAM 2 MG/ML IJ SOLN
2 MG/ML | Freq: Once | INTRAMUSCULAR | Status: AC
Start: 2021-08-23 — End: 2021-08-23
  Administered 2021-08-23: 23:00:00 1 mg via INTRAVENOUS

## 2021-08-23 MED ORDER — POTASSIUM CHLORIDE CRYS ER 20 MEQ PO TBCR
20 MEQ | Freq: Once | ORAL | Status: AC
Start: 2021-08-23 — End: 2021-08-22
  Administered 2021-08-23: 02:00:00 40 meq via ORAL

## 2021-08-23 MED ORDER — ACETAMINOPHEN 500 MG PO TABS
500 MG | ORAL | Status: AC
Start: 2021-08-23 — End: 2021-08-23
  Administered 2021-08-23: 23:00:00 1000 mg via ORAL

## 2021-08-23 MED ORDER — SODIUM CHLORIDE 0.9% IV BOLUS (PER WEIGHT)
0.9 % | Freq: Once | INTRAVENOUS | Status: DC
Start: 2021-08-23 — End: 2021-08-23

## 2021-08-23 MED ORDER — SODIUM CHLORIDE 0.9 % IV BOLUS
0.9 % | Freq: Once | INTRAVENOUS | Status: AC
Start: 2021-08-23 — End: 2021-08-23
  Administered 2021-08-23: 16:00:00 1000 mL via INTRAVENOUS

## 2021-08-23 MED ORDER — HYDROXYZINE PAMOATE 25 MG PO CAPS
25 MG | ORAL | Status: AC
Start: 2021-08-23 — End: 2021-08-23
  Administered 2021-08-23: 06:00:00 25 mg via ORAL

## 2021-08-23 MED ORDER — METOPROLOL SUCCINATE ER 25 MG PO TB24
25 MG | ORAL | Status: AC
Start: 2021-08-23 — End: 2021-08-23
  Administered 2021-08-23: 18:00:00 25 mg via ORAL

## 2021-08-23 MED ORDER — STERILE WATER FOR INJECTION (MIXTURES ONLY)
2 g | INTRAMUSCULAR | Status: AC
Start: 2021-08-23 — End: 2021-08-28
  Administered 2021-08-24 – 2021-08-28 (×5): 2000 mg via INTRAVENOUS

## 2021-08-23 MED ORDER — PANTOPRAZOLE SODIUM 40 MG PO TBEC
40 MG | Freq: Once | ORAL | Status: AC
Start: 2021-08-23 — End: 2021-08-23
  Administered 2021-08-23: 18:00:00 40 mg via ORAL

## 2021-08-23 MED ORDER — NORMAL SALINE FLUSH 0.9 % IV SOLN
0.9 % | INTRAVENOUS | Status: AC | PRN
Start: 2021-08-23 — End: 2021-08-28
  Administered 2021-08-25 – 2021-08-28 (×4): 10 mL via INTRAVENOUS

## 2021-08-23 MED ORDER — FAMOTIDINE 20 MG PO TABS
20 MG | Freq: Every day | ORAL | Status: DC
Start: 2021-08-23 — End: 2021-08-23

## 2021-08-23 MED FILL — SODIUM CHLORIDE 0.9 % IV SOLN: 0.9 % | INTRAVENOUS | Qty: 1000

## 2021-08-23 MED FILL — CALCIUM GLUCONATE 10 % IV SOLN: 10 % | INTRAVENOUS | Qty: 10

## 2021-08-23 MED FILL — ACETAMINOPHEN EXTRA STRENGTH 500 MG PO TABS: 500 MG | ORAL | Qty: 2

## 2021-08-23 MED FILL — HYDROXYZINE PAMOATE 25 MG PO CAPS: 25 MG | ORAL | Qty: 1

## 2021-08-23 MED FILL — KLOR-CON M20 20 MEQ PO TBCR: 20 MEQ | ORAL | Qty: 2

## 2021-08-23 MED FILL — CITALOPRAM HYDROBROMIDE 20 MG PO TABS: 20 MG | ORAL | Qty: 1

## 2021-08-23 MED FILL — IPRATROPIUM-ALBUTEROL 0.5-2.5 (3) MG/3ML IN SOLN: RESPIRATORY_TRACT | Qty: 3

## 2021-08-23 MED FILL — LORAZEPAM 2 MG/ML IJ SOLN: 2 MG/ML | INTRAMUSCULAR | Qty: 1

## 2021-08-23 MED FILL — PREGABALIN 50 MG PO CAPS: 50 MG | ORAL | Qty: 1

## 2021-08-23 MED FILL — ISOVUE-300 61 % IV SOLN: 61 % | INTRAVENOUS | Qty: 70

## 2021-08-23 MED FILL — METOPROLOL SUCCINATE ER 25 MG PO TB24: 25 MG | ORAL | Qty: 1

## 2021-08-23 MED FILL — BD POSIFLUSH 0.9 % IV SOLN: 0.9 % | INTRAVENOUS | Qty: 40

## 2021-08-23 MED FILL — LORAZEPAM 0.5 MG PO TABS: 0.5 MG | ORAL | Qty: 1

## 2021-08-23 MED FILL — PANTOPRAZOLE SODIUM 40 MG PO TBEC: 40 MG | ORAL | Qty: 1

## 2021-08-23 MED FILL — CEFTRIAXONE SODIUM 1 G IJ SOLR: 1 g | INTRAMUSCULAR | Qty: 1000

## 2021-08-23 MED FILL — SODIUM CHLORIDE 0.9 % IV SOLN: 0.9 % | INTRAVENOUS | Qty: 2000

## 2021-08-23 NOTE — ED Notes (Signed)
Pt noted to have O2 sat of 90% dipping to 89% while sleeping; placed on O2 at 2 lpm with improvement     Samantha Crimes, RN  08/23/21 2036

## 2021-08-23 NOTE — ED Triage Notes (Signed)
Pt to triage after leaving AMA. Patient febrile with positive blood cultures and dx of UTI.

## 2021-08-23 NOTE — ED Notes (Signed)
Pt noted to be hypotensive, asymptomatic, paged MD.     Samantha Crimes, RN  08/23/21 2056

## 2021-08-23 NOTE — ED Notes (Addendum)
Pt noted to be more lethargic; was previously sleeping but easy to arouse with verbal stimulation and responding to questions quickly and appropriately, now requiring physical stimulation to arouse and not staying awake.     Samantha Crimes, RN  08/23/21 2254       Samantha Crimes, RN  08/23/21 [phone]

## 2021-08-23 NOTE — Progress Notes (Signed)
[facility] Renal Dosing [facility]    Pharmacist Renal Dosing Note for Famotidine   Physician/Prescriber:  Dr. Barrington Ellison, W.     Previous Regimen Famotidine 20 mg BID   Serum Creatinine No results found for: CRES, CREA, CREAPOC   Creatinine Clearance Estimated Creatinine Clearance: 43 mL/min (A) (based on SCr of 1.57 mg/dL (H)).   BUN Lab Results   Component Value Date/Time    BUN 20 08/23/2021 05:14 PM           The following medication: Famotidine 20 mg BID was automatically dose-adjusted per [facility] P&T Committee Protocol, with respect to renal function.      Dosage changed to:  Famotidine 20 mg daily    Additional notes:    Pharmacy to continue to monitor patient daily.   Will make dosage adjustments based upon changing renal function.  Signed Tanga Gloor, RPH.

## 2021-08-23 NOTE — ED Provider Notes (Signed)
EMERGENCY DEPARTMENT HISTORY AND PHYSICAL EXAM    12:35 PM      Date: 08/23/2021  Patient Name: Mary [name]    History of Presenting Illness     Chief Complaint   Patient presents with    Blood Infection         History Provided By: the patient.     Additional History (Context): Mary [name] is a 56 y.o. female with a history of anxiety, bipolar 1 disorder, CHF, COPD, and PTSD presenting to the emergency department due to receive a call about positive blood cultures.  Patient was seen and evaluated in the emergency department on 08/23/2021.  She was diagnosed with a urinary tract infection as well as hypokalemia.  Patient was discharged on Keflex.  Patient states that she is still feeling extremely sick.  Admits to dysuria and increased urination.  Patient states that she has still been having fevers and took Tylenol prior to arrival.  Denies any chest pain or shortness of breath.    Calls were placed to the patient this a.m. due to positive blood cultures, but the patient had no working phone numbers.  PD was sent and the patient presented to the emergency department.    PCP: Mickle Plumb, MD    Current Facility-Administered Medications   Medication Dose Route Frequency Provider Last Rate Last Admin    0.9 % sodium chloride bolus  1,000 mL IntraVENous Once Rockey Situ, PA-C 495.9 mL/hr at 08/23/21 1225 1,000 mL at 08/23/21 1225     Current Outpatient Medications   Medication Sig Dispense Refill    cephALEXin (KEFLEX) 500 MG capsule Take 1 capsule by mouth 4 times daily for 10 days 40 capsule 0    empagliflozin (JARDIANCE) 10 MG tablet Take 1 tablet by mouth daily 30 tablet 5    pravastatin (PRAVACHOL) 40 MG tablet Take 1 tablet by mouth daily 30 tablet 5    metoprolol succinate (TOPROL XL) 25 MG extended release tablet Take 1 tablet by mouth daily 30 tablet 5    warfarin (COUMADIN) 2.5 MG tablet Take 2 tablets by mouth daily 30 tablet 3    pregabalin (LYRICA) 200 MG capsule take 1 capsule by mouth  three times a day 90 capsule 2    albuterol sulfate HFA (PROVENTIL;VENTOLIN;PROAIR) 108 (90 Base) MCG/ACT inhaler Inhale 2 puffs into the lungs every 6 hours as needed      aspirin 81 MG chewable tablet Take 81 mg by mouth daily      cetirizine (ZYRTEC) 10 MG tablet Take 10 mg by mouth daily      cyanocobalamin 1000 MCG tablet cyanocobalamin (vit B-12) 1,000 mcg tablet   take 1 tablet by mouth daily as directed      docusate (COLACE, DULCOLAX) 100 MG CAPS Col-Rite 100 mg capsule   take 1 capsule by mouth twice a day if needed for constipation      fluticasone (FLONASE) 50 MCG/ACT nasal spray 1 spray by nasal route daily      furosemide (LASIX) 40 MG tablet Take 40 mg by mouth daily as needed      hydrOXYzine HCl (ATARAX) 25 MG tablet Take 25 mg by mouth 2 times daily as needed (Patient not taking: No sig reported)      albuterol-ipratropium (COMBIVENT RESPIMAT) 20-100 MCG/ACT AERS inhaler Inhale 1 puff into the lungs 4 times daily      lamoTRIgine (LAMICTAL) 25 MG tablet Take 75 mg by mouth daily  omeprazole (PRILOSEC) 40 MG delayed release capsule Take 40 mg by mouth daily      tiZANidine (ZANAFLEX) 2 MG tablet Take 2 mg by mouth 3 times daily as needed (Patient not taking: No sig reported)         Past History     Past Medical History:  Past Medical History:   Diagnosis Date    Anxiety     Bipolar 1 disorder (HCC)     CHF (congestive heart failure) (HCC)     COPD (chronic obstructive pulmonary disease) (HCC)     COPD (chronic obstructive pulmonary disease) (HCC)     H/O aortic valve replacement     PTSD (post-traumatic stress disorder)        Past Surgical History:  Past Surgical History:   Procedure Laterality Date    CARDIAC CATHETERIZATION      CARDIAC VALVE SURGERY      mitral valve replacement    HAND/FINGER SURGERY UNLISTED Right     I&D right middle finger       Family History:  Family History   Problem Relation Age of Onset    No Known Problems Mother        Social History:  Social History     Tobacco  Use    Smoking status: Every Day     Packs/day: 1.00     Types: Cigarettes    Smokeless tobacco: Never   Vaping Use    Vaping Use: Never used   Substance Use Topics    Alcohol use: Never    Drug use: Yes     Types: Marijuana Sherrie Mustache)       Allergies:  Allergies   Allergen Reactions    Alprazolam Other (See Comments)     Gets suicidal    Diazepam Other (See Comments)     Gets suicidal    Tobacco     Adhesive Tape Rash    Lithium Nausea And Vomiting         Review of Systems       Review of Systems   Constitutional:  Positive for fatigue and fever. Negative for chills.   Respiratory:  Negative for cough, chest tightness and shortness of breath.    Cardiovascular:  Negative for chest pain.   Gastrointestinal:  Negative for abdominal pain, constipation, diarrhea, nausea and vomiting.   Genitourinary:  Positive for dysuria, flank pain and frequency.   Skin:  Negative for rash and wound.   Neurological:  Negative for dizziness, weakness, light-headedness, numbness and headaches.       Physical Exam   BP 98/82   Pulse (!) 115   Temp (!) 101.5 F (38.6 C) (Oral)   Resp 18   Ht _0  (1.6 m)   Wt 194 lb (88 kg)   SpO2 96%   BMI 34.37 kg/m       Physical Exam  Vitals and nursing note reviewed.   Constitutional:       General: She is awake. She is not in acute distress.     Appearance: Normal appearance. She is well-developed, well-groomed and normal weight. She is ill-appearing.   HENT:      Head: Normocephalic and atraumatic.   Cardiovascular:      Rate and Rhythm: Regular rhythm. Tachycardia present.      Pulses: Normal pulses.      Heart sounds: Normal heart sounds, S1 normal and S2 normal. No murmur heard.  Pulmonary:      Effort:  Pulmonary effort is normal. No tachypnea or respiratory distress.      Breath sounds: Normal breath sounds and air entry. No decreased breath sounds, wheezing, rhonchi or rales.   Abdominal:      General: Abdomen is flat. Bowel sounds are normal. There is no distension.      Palpations:  Abdomen is soft.      Tenderness: There is no abdominal tenderness. There is no guarding or rebound.      Hernia: No hernia is present.   Musculoskeletal:      Right lower leg: No edema.      Left lower leg: No edema.   Skin:     General: Skin is warm and dry.      Capillary Refill: Capillary refill takes less than 2 seconds.   Neurological:      Mental Status: She is alert and oriented to person, place, and time. Mental status is at baseline.   Psychiatric:         Mood and Affect: Mood is anxious.         Speech: Speech is rapid and pressured.         Behavior: Behavior is cooperative.         Diagnostic Study Results     Labs -  Recent Results (from the past 12 hour(s))   Basic Metabolic Panel    Collection Time: 08/23/21  1:00 AM   Result Value Ref Range    Sodium 137 136 - 145 mmol/L    Potassium 3.5 3.5 - 5.5 mmol/L    Chloride 113 (H) 100 - 111 mmol/L    CO2 18 (L) 21 - 32 mmol/L    Anion Gap 6 3.0 - 18 mmol/L    Glucose 105 (H) 74 - 99 mg/dL    BUN 22 (H) 7.0 - 18 MG/DL    Creatinine 1.30 0.6 - 1.3 MG/DL    Bun/Cre Ratio 17 12 - 20      Est, Glom Filt Rate 49 (L) >60 ml/min/1.105m    Calcium 7.1 (L) 8.5 - 10.1 MG/DL   CBC with Auto Differential    Collection Time: 08/23/21 10:40 AM   Result Value Ref Range    WBC 9.1 4.6 - 13.2 K/uL    RBC 4.09 (L) 4.20 - 5.30 M/uL    Hemoglobin 12.4 12.0 - 16.0 g/dL    Hematocrit 35.9 35.0 - 45.0 %    MCV 87.8 78.0 - 100.0 FL    MCH 30.3 24.0 - 34.0 PG    MCHC 34.5 31.0 - 37.0 g/dL    RDW 14.2 11.6 - 14.5 %    Platelets 125 (L) 135 - 420 K/uL    MPV 11.2 9.2 - 11.8 FL    Nucleated RBCs 0.0 0 PER 100 WBC    nRBC 0.00 0.00 - 0.01 K/uL    Seg Neutrophils 86 (H) 40 - 73 %    Lymphocytes 6 (L) 21 - 52 %    Monocytes 6 3 - 10 %    Eosinophils % 0 0 - 5 %    Basophils 0 0 - 2 %    Immature Granulocytes 1 (H) 0.0 - 0.5 %    Segs Absolute 7.8 1.8 - 8.0 K/UL    Absolute Lymph # 0.6 (L) 0.9 - 3.6 K/UL    Absolute Mono # 0.6 0.05 - 1.2 K/UL    Absolute Eos # 0.0 0.0 - 0.4 K/UL     Basophils Absolute  0.0 0.0 - 0.1 K/UL    Absolute Immature Granulocyte 0.1 (H) 0.00 - 0.04 K/UL    Differential Type AUTOMATED     Comprehensive Metabolic Panel    Collection Time: 08/23/21 10:40 AM   Result Value Ref Range    Sodium 138 136 - 145 mmol/L    Potassium 3.2 (L) 3.5 - 5.5 mmol/L    Chloride 110 100 - 111 mmol/L    CO2 19 (L) 21 - 32 mmol/L    Anion Gap 9 3.0 - 18 mmol/L    Glucose 99 74 - 99 mg/dL    BUN 21 (H) 7.0 - 18 MG/DL    Creatinine 1.49 (H) 0.6 - 1.3 MG/DL    Bun/Cre Ratio 14 12 - 20      Est, Glom Filt Rate 41 (L) >60 ml/min/1.53m    Calcium 7.7 (L) 8.5 - 10.1 MG/DL    Total Bilirubin 0.6 0.2 - 1.0 MG/DL    ALT 20 13 - 56 U/L    AST 20 10 - 38 U/L    Alk Phosphatase 87 45 - 117 U/L    Total Protein 5.8 (L) 6.4 - 8.2 g/dL    Albumin 2.9 (L) 3.4 - 5.0 g/dL    Globulin 2.9 2.0 - 4.0 g/dL    Albumin/Globulin Ratio 1.0 0.8 - 1.7     Lactate, Sepsis    Collection Time: 08/23/21 10:40 AM   Result Value Ref Range    Lactic Acid, Sepsis 1.1 0.4 - 2.0 MMOL/L   Procalcitonin    Collection Time: 08/23/21 10:40 AM   Result Value Ref Range    Procalcitonin 3.70 ng/mL   Troponin    Collection Time: 08/23/21 10:40 AM   Result Value Ref Range    Troponin, High Sensitivity 10 0 - 54 ng/L   EKG 12 Lead    Collection Time: 08/23/21 10:40 AM   Result Value Ref Range    Ventricular Rate 102 BPM    Atrial Rate 102 BPM    P-R Interval 136 ms    QRS Duration 86 ms    Q-T Interval 336 ms    QTc Calculation (Bazett) 437 ms    P Axis 91 degrees    R Axis 33 degrees    T Axis 90 degrees    Diagnosis       Sinus tachycardia  Nonspecific ST and T wave abnormality  Abnormal ECG  No previous ECGs available     COVID-19 & Influenza Combo    Collection Time: 08/23/21 10:54 AM    Specimen: Nasopharyngeal   Result Value Ref Range    SARS-CoV-2, PCR Not detected NOTD      Rapid Influenza A By PCR Not detected NOTD      Rapid Influenza B By PCR Not detected NOTD     POCT Glucose    Collection Time: 08/23/21 10:57 AM   Result Value  Ref Range    POC Glucose 94 70 - 110 mg/dL   POCT Glucose    Collection Time: 08/23/21 11:06 AM   Result Value Ref Range    QC OK? 94    Urinalysis    Collection Time: 08/23/21 11:20 AM   Result Value Ref Range    Color, UA YELLOW      Appearance CLOUDY      Specific Gravity, UA 1.021 1.005 - 1.030      pH, Urine 6.5 5.0 - 8.0      Protein, UA 30 (  A) NEG mg/dL    Glucose, UA Negative NEG mg/dL    Ketones, Urine Negative NEG mg/dL    Bilirubin Urine Negative NEG      Blood, Urine LARGE (A) NEG      Urobilinogen, Urine 1.0 0.2 - 1.0 EU/dL    Nitrite, Urine Negative NEG      Leukocyte Esterase, Urine SMALL (A) NEG     Urinalysis, Micro    Collection Time: 08/23/21 11:20 AM   Result Value Ref Range    WBC, UA 6 to 8 0 - 4 /hpf    RBC, UA 12 to 15 0 - 5 /hpf    Epithelial Cells UA 1+ 0 - 5 /lpf    BACTERIA, URINE 3+ (A) NEG /hpf       Radiologic Studies -   CT ABDOMEN PELVIS W IV CONTRAST Additional Contrast? None   Final Result   1. Mildly edematous right kidney, with perirenal stranding. Trace left perirenal   stranding. Bilateral renal pelvis wall thickening and enhancement. Findings are   concerning for upper urinary tract infection/potentially very early   pyelonephritis (no striated renal enhancement at this time).   -Probable minimally complex cyst right kidney      2. Gallbladder is mildly distended, but no wall thickening or pericholecystic   fluid, and no stones visible by CT. Nonspecific.      3. Mild colonic diverticulosis.      4. Additional chronic findings as above.      XR CHEST PORTABLE   Final Result   Mild vascular congestion with no focal pneumonia or consolidation.            Medical Decision Making   I am the first provider for this patient.    I reviewed the vital signs, available nursing notes, past medical history, past surgical history, family history and social history.    Vital Signs-Reviewed the patient's vital signs.    Pulse Oximetry Analysis -  96 on room air (Interpretation)    Cardiac  Monitor:  Rate: 105-120 bpm   Rhythm:  Sinus Tachycardia     EKG:   Rate: 102 bpm    Rhythm: Sinus Tachycardia    Interpretation: Normal axis.  Normal intervals.  No ST elevations or depressions.    Records Reviewed: Prior medical records, Previous radiology studies, and Previous laboratory studies(Time of Review: 12:35 PM)    ED Course: Progress Notes, Reevaluation, and Consults:  Ddx: UTI, pyelonephritis, sepsis    Provider Notes (Medical Decision Making):   Patient is a 56 year old female with a history of anxiety, bipolar 1 disorder, CHF, COPD, and PTSD presenting to the emergency department due to receive a call about positive blood cultures.  On exam, patient is alert, oriented x3, tearful.  Heart regular rhythm, tachycardic.  No murmurs appreciated.  No lower extremity edema.  Lungs clear to auscultation bilaterally.  Abdomen is soft and nontender.  Patient is tachycardic, febrile with temperature of 101.5, positive blood cultures in the setting of UTI will initiate sepsis at this time.  Will obtain CT of the abdomen pelvis to rule out pyelonephritis.    Review reveals that the patient was seen on 08/22/2021. WBC of 12.0.  UA with 2+ bacteria, moderate leukocyte esterase, too numerous to count WBC.  Patient was given IV ceftriaxone while in the emergency department and discharged on Keflex.    Patient discussed with infectious disease Dr. Golden Circle.  Agrees with repeating laboratory studies as well as obtaining CT.  States that since the patient is not frequently in the hospital, Rocephin will likely cover.  States that we do not need to give another dose at this time due to Rocephin being every 24h.  Requests that she be added to the treatment team.    Patient with history of CHF, BNP of 2189.  Will hold on 30 mg/kg at this time and start with 1000 mL bolus pending lactic.    CBC WNL.  CMP WNL, creatinine of 1.49 which is similar compared to prior.  Lactic acid 1.1.  Pro-Cal 3.7 troponin of 10.  UA significant for  small leukocyte esterase, 3+ bacteria.    Chest x-ray shows mild vascular congestion with no focal pneumonia or consolidation.    CT abdomen pelvis shows mildly edematous right kidney with perirenal stranding, trace left perirenal stranding bilateral renal pelvis wall thickening and enhancement concerning for upper urinary tract infection and early pyelonephritis.    Patient becoming increasingly agitated.  Requesting her home medications.  All home meds ordered at this time.    Patient requesting to sign out AMA stating that she needs to go home and get her stuff.  Patient states that she will be back in a couple of hours.  Informed the patient that she has a severe infection that includes her bladder and has spread to the kidneys as well as has bacteria in her blood.  Informed the patient that this could result in worsening infection, spreading of the infection, organ dysfunction, and ultimately death.  Patient states that she understands all of these things and her health will be in God's hands.  Patient is A+O x4, answering all questions appropriately.  States that she will be back.    AMA form completed and witnessed by nursing staff.      Diagnosis     Clinical Impression:   1. Acute cystitis with hematuria    2. Septicemia ([place])    3. Positive blood cultures    4. Pyelonephritis        Disposition: Admit    No follow-up provider specified.        Medication List        ASK your doctor about these medications      albuterol sulfate HFA 108 (90 Base) MCG/ACT inhaler  Commonly known as: PROVENTIL;VENTOLIN;PROAIR     albuterol-ipratropium 20-100 MCG/ACT Aers inhaler  Commonly known as: COMBIVENT RESPIMAT     aspirin 81 MG chewable tablet     cephALEXin 500 MG capsule  Commonly known as: KEFLEX  Take 1 capsule by mouth 4 times daily for 10 days     cetirizine 10 MG tablet  Commonly known as: ZYRTEC     cyanocobalamin 1000 MCG tablet     docusate 100 MG Caps  Commonly known as: COLACE, DULCOLAX     empagliflozin 10  MG tablet  Commonly known as: Jardiance  Take 1 tablet by mouth daily     fluticasone 50 MCG/ACT nasal spray  Commonly known as: FLONASE     furosemide 40 MG tablet  Commonly known as: LASIX     hydrOXYzine HCl 25 MG tablet  Commonly known as: ATARAX     lamoTRIgine 25 MG tablet  Commonly known as: LAMICTAL     metoprolol succinate 25 MG extended release tablet  Commonly known as: TOPROL XL  Take 1 tablet by mouth daily     omeprazole 40 MG delayed release capsule  Commonly known as: PRILOSEC  pravastatin 40 MG tablet  Commonly known as: PRAVACHOL  Take 1 tablet by mouth daily     pregabalin 200 MG capsule  Commonly known as: LYRICA  take 1 capsule by mouth three times a day     tiZANidine 2 MG tablet  Commonly known as: ZANAFLEX     warfarin 2.5 MG tablet  Commonly known as: COUMADIN  Take as directed. If you are unsure how to take this medication, talk to your nurse or doctor.  Original instructions: Take 2 tablets by mouth daily               Dictation disclaimer:  Please note that this dictation was completed with Dragon, the computer voice recognition software.  Quite often unanticipated grammatical, syntax, homophones, and other interpretive errors are inadvertently transcribed by the computer software.  Please disregard these errors.  Please excuse any errors that have escaped final proofreading.        Rockey Situ, PA-C  08/23/21 1710

## 2021-08-23 NOTE — ED Notes (Signed)
Pt verbalized frustration with her care last night. Reassured Pt and listening to her complaints. Nurse director at bedside.     Gwenevere Abbot, RN  08/23/21 1424

## 2021-08-23 NOTE — ED Notes (Signed)
Pt asking for home meds PA notified of request.     Irven Arbovale, RN  08/23/21 1316

## 2021-08-23 NOTE — Consults (Signed)
[facility]  (A Division of [facility])      Consultation Note      Date of Admission: 08/23/2021    Date of Note: 08/23/2021      Reason for Referral: GNR sepsis  Referring Physician: Thamas Jaegers, PA-C    Micro from this admission:   4/9 respiratory viral panel: negative  4/9 blood culture: 4 out of 4 gram-negative   4/10 blood cultures: Pending  4/10 rapid influenza a and B PCR: Negative    Current Antimicrobials:    Prior Antimicrobials:  Ceftriaxone 4/9 present        Assessment:         Gram-negative rod sepsis suspect GU origin: 4/9 UA with too numerous to count WBCs, 2+ bacteria, moderate leukocyte esterase, large blood; repeat UA on 6/10 with 6-8 WBCs 3+ bacteria  Suspected pyelonephritis on the right : No evidence of obstruction or hydronephrosis noted on CT scan  Urinary tract infection-awaiting CT scan to rule out pyelonephritis or obstructive uropathy  Right lower lobe pulmonary nodule 3 mm  Bipolar disorder type I with anxiety  COPD  CHF  History of aortic valve replacement    Plan:   Continue with ceftriaxone for gram-negative rod sepsis.  Awaiting final identification and speciation from 4/9 blood cultures.  Follow-up 4/10 blood cultures  Trend CBC, BMP, procalcitonin    Helyn Numbers, DO  [facility]  [address], [address], [place], [place] [address]  Office: [phone], Ext [phone]      Lines / Catheters:  peripheral    HPI:  [name] is a 56 year old female with a history of CHF, COPD, anxiety with bipolar 1 disorder who presented to the emergency department yesterday evening secondary to fevers chills and generalized malaise.  She been having fevers since Friday and had a Tmax of 101.7.  She had called EMS which brought her to the emergency department where she was noted to have hypotension with BPs of 85/55.  At that time she was diagnosed with a UTI given IV fluids and was discharged home on Keflex.  She was  called back to the ER earlier this morning secondary to blood cultures being noted to be positive in 4 out of 4 bottles from her recent ED visit.  She continues to have ongoing fevers tachycardia and chills.  She has some right-sided flank pain.  She had been given a dose of ceftriaxone yesterday and is doing for a second dose of ceftriaxone this evening.  Overall she feels slightly better than she had yesterday although not too much.  No associated nausea vomiting or diarrhea.  Poor appetite         Past Medical History:   Diagnosis Date    Anxiety     Bipolar 1 disorder (HCC)     CHF (congestive heart failure) (HCC)     COPD (chronic obstructive pulmonary disease) (HCC)     COPD (chronic obstructive pulmonary disease) (HCC)     H/O aortic valve replacement     PTSD (post-traumatic stress disorder)      Past Surgical History:   Procedure Laterality Date    CARDIAC CATHETERIZATION      CARDIAC VALVE SURGERY      mitral valve replacement    HAND/FINGER SURGERY UNLISTED Right     I&D right middle finger     Family History   Problem Relation Age of Onset    No Known Problems Mother  Medications reviewed as below:   Current Facility-Administered Medications   Medication Dose Route Frequency Provider Last Rate Last Admin    0.9 % sodium chloride bolus  1,000 mL IntraVENous Once Evert Kohl, PA-C 495.9 mL/hr at 08/23/21 1225 1,000 mL at 08/23/21 1225    LORazepam (ATIVAN) tablet 0.5 mg  0.5 mg Oral Q4H PRN Evert Kohl, PA-C   0.5 mg at 08/23/21 1400    pregabalin (LYRICA) capsule 200 mg  200 mg Oral NOW Evert Kohl, PA-C        metoprolol succinate (TOPROL XL) extended release tablet 25 mg  25 mg Oral NOW Evert Kohl, PA-C        pantoprazole (PROTONIX) tablet 40 mg  40 mg Oral Once Evert Kohl, PA-C         Current Outpatient Medications   Medication Sig Dispense Refill    cephALEXin (KEFLEX) 500 MG capsule Take 1 capsule by mouth 4 times daily for 10 days (Patient not taking: Reported on 08/23/2021) 40  capsule 0    empagliflozin (JARDIANCE) 10 MG tablet Take 1 tablet by mouth daily (Patient not taking: Reported on 08/23/2021) 30 tablet 5    pravastatin (PRAVACHOL) 40 MG tablet Take 1 tablet by mouth daily 30 tablet 5    metoprolol succinate (TOPROL XL) 25 MG extended release tablet Take 1 tablet by mouth daily 30 tablet 5    warfarin (COUMADIN) 2.5 MG tablet Take 2 tablets by mouth daily 30 tablet 3    pregabalin (LYRICA) 200 MG capsule take 1 capsule by mouth three times a day 90 capsule 2    albuterol sulfate HFA (PROVENTIL;VENTOLIN;PROAIR) 108 (90 Base) MCG/ACT inhaler Inhale 2 puffs into the lungs every 6 hours as needed      aspirin 81 MG chewable tablet Take 1 tablet by mouth daily      cetirizine (ZYRTEC) 10 MG tablet Take 1 tablet by mouth daily      cyanocobalamin 1000 MCG tablet cyanocobalamin (vit B-12) 1,000 mcg tablet   take 1 tablet by mouth daily as directed (Patient not taking: Reported on 08/23/2021)      docusate (COLACE, DULCOLAX) 100 MG CAPS Col-Rite 100 mg capsule   take 1 capsule by mouth twice a day if needed for constipation      fluticasone (FLONASE) 50 MCG/ACT nasal spray 1 spray by nasal route daily      furosemide (LASIX) 40 MG tablet Take 1 tablet by mouth daily as needed      hydrOXYzine HCl (ATARAX) 25 MG tablet Take 25 mg by mouth 2 times daily as needed (Patient not taking: No sig reported)      albuterol-ipratropium (COMBIVENT RESPIMAT) 20-100 MCG/ACT AERS inhaler Inhale 1 puff into the lungs 4 times daily      lamoTRIgine (LAMICTAL) 25 MG tablet Take 75 mg by mouth daily (Patient not taking: Reported on 08/23/2021)      omeprazole (PRILOSEC) 40 MG delayed release capsule Take 1 capsule by mouth daily      tiZANidine (ZANAFLEX) 2 MG tablet Take 2 mg by mouth 3 times daily as needed (Patient not taking: Reported on 07/12/2021)       Allergies   Allergen Reactions    Alprazolam Other (See Comments)     Gets suicidal    Diazepam Other (See Comments)     Gets suicidal    Tobacco      Adhesive Tape Rash    Lithium Nausea And Vomiting  Social History     Socioeconomic History    Marital status: Divorced     Spouse name: Not on file    Number of children: Not on file    Years of education: Not on file    Highest education level: Not on file   Occupational History    Not on file   Tobacco Use    Smoking status: Every Day     Packs/day: 1.00     Types: Cigarettes    Smokeless tobacco: Never   Vaping Use    Vaping Use: Never used   Substance and Sexual Activity    Alcohol use: Never    Drug use: Yes     Types: Marijuana Sheran Fava)    Sexual activity: Not on file   Other Topics Concern    Not on file   Social History Narrative    Not on file     Social Determinants of Health     Financial Resource Strain: Not on file   Food Insecurity: Not on file   Transportation Needs: Not on file   Physical Activity: Not on file   Stress: Not on file   Social Connections: Not on file   Intimate Partner Violence: Not on file   Housing Stability: Not on file        Review of Systems    Negative Unless BOLDED    General: fevers, chills, unexplained weight loss, malaise, fatigue.  HEENT:  headaches,sinus pain or presure, recent URI, recent dental procedures;  tinnitus, hearing loss, catarats, dizziness   PUlMONARY:  cough , shortness of breath, sputum production, wheezing,  hx of asthma or COPD. previous treatement for TB or PPD.  Cardiovascular: chest pain, previous CAD/MI, hx valvular heart disease,  murmurs, peripheral edema, weight gain  GI:   nausea, vomiting, diarrhea, abdominal pain, prior C.diff, heartburn, melena, hematochezia, constipation  GU:  urinary frequency, dysuria, hematuria, bladder incontinence, flank pain, chronic foley, recurrent UTI's, urine retention  Neurologic:  seizures, syncope, prior CVA/TIA, confusion, memory impairment, neuropathy, weakness of extremities, difficulty speaking/word finding, headache, vision changes  Musculoskeletal:  myalgias, arthralgias, joint pain, joint swelling, back  pain, falls  Skin:  Pruritis, rash, chronic stasis changes, diabetic foot ulcers, lymphedema, pressure wounds  Endocrine: polyuria, polydipsia, hair loss  Psych: anxiousness, prior substance abuse, suicidal ideation, depressed mood.  Heme-Onc: prior DVT, easy bruising, fatigue, malignancy        Objective:      BP 117/79   Pulse (!) 108   Temp 100.4 F (38 C) (Oral)   Resp 18   Ht 5\' 3"  (1.6 m)   Wt 194 lb (88 kg)   SpO2 96%   BMI 34.37 kg/m   Temp (24hrs), Avg:100.5 F (38.1 C), Min:99.2 F (37.3 C), Max:101.5 F (38.6 C)        General:   awake alert and oriented   Skin:   no rashes or skin lesions noted on limited exam   HEENT:  Normocephalic, atraumatic, PERRL, EOMI, no scleral icterus or pallor; no conjunctival hemmohage;    Lymph Nodes:   no cervical or inguinal adenopathy   Lungs:   non-labored, bilaterally clear to aspiration   Heart:  RRR, s1 and s2; no murmurs rubs or gallops, no edema, + pedal pulses   Abdomen:  soft, non-distended, active bowel sounds. Non-tender   Genitourinary:  deferred   Extremities:   no clubbing, cyanosis; no joint effusions or swelling; Full ROM of all large joints to  the upper and lower extremities; muscle mass appropriate for age   Neurologic:  No gross focal sensory abnormalities; 5/5 muscle strength to upper and lower extremities. Speech appropirate. Cranial nerves intact   Psychiatric:   appropriate and interactive.       Labs: Results:   Chemistry Recent Labs     08/22/21  1953 08/23/21  0100 08/23/21  1040   NA 139 137 138   K 2.7* 3.5 3.2*   CL 108 113* 110   CO2 24 18* 19*   BUN 25* 22* 21*   GLOB 3.6  --  2.9      CBC w/Diff Recent Labs     08/22/21  1953 08/23/21  1040   WBC 12.0 9.1   RBC 4.35 4.09*   HGB 13.3 12.4   HCT 39.5 35.9   PLT 147 125*            No results found for: SDES No components found for: CULT     Results       Procedure Component Value Units Date/Time    Culture, Blood 2 [[id number]] Collected: 08/23/21 1059    Order Status: Sent  Specimen: Blood Updated: 08/23/21 1144    COVID-19 & Influenza Combo [[id number]] Collected: 08/23/21 1054    Order Status: Completed Specimen: Nasopharyngeal Updated: 08/23/21 1130     SARS-CoV-2, PCR Not detected        Comment: Not Detected results do not preclude SARS-CoV-2 infection and should not be used as the sole basis for patient management decisions.Results must be combined with clinical observations, patient history, and epidemiological information.        Rapid Influenza A By PCR Not detected        Rapid Influenza B By PCR Not detected        Comment: Testing was performed using cobas Liat SARS-CoV-2 and Influenza A/B nucleic acid assay.  This test is a multiplex Real-Time Reverse Transcriptase Polymerase Chain Reaction (RT-PCR) based in vitro diagnostic test intended for the qualitative detection of nucleic acids from SARS-CoV-2, Influenza A, and Influenza B in nasopharyngeal for use under the FDA's Emergency Use Authorization(EAU) only.       Fact sheet for Patients: SalonClasses.at  Fact sheet for Healthcare Providers: https://www.fda.fov/media/142191/download         Culture, Blood 1 [[id number]] Collected: 08/23/21 1039    Order Status: Sent Specimen: Blood Updated: 08/23/21 1105    Blood Culture 2 [[id number]] Collected: 08/22/21 1959    Order Status: Completed Specimen: Blood Updated: 08/23/21 0823     Special Requests NO SPECIAL REQUESTS        Gram stain       AEROBIC AND ANAEROBIC BOTTLES Gram negative rods                  SMEAR CALLED TO AND CORRECTLY REPEATED BY: PA D POOLE. ED. 0820. 08/23/21 BY [id number]           Culture       CULTURE IN PROGRESS,FURTHER UPDATES TO FOLLOW Sent to [facility] for ID/Susceptibility if indicated.          Culture, Blood, PCR ID Panel [[id number]] Collected: 08/22/21 1954    Order Status: Canceled     Culture, Blood 1 [[id number]] Collected: 08/22/21 1953    Order Status: Completed Specimen: Blood Updated: 08/23/21 0820     Special  Requests NO SPECIAL REQUESTS        Gram stain  AEROBIC AND ANAEROBIC BOTTLES Gram negative rods                  SMEAR CALLED TO AND CORRECTLY REPEATED BY: PA D POOLE, ED, 0820, 08/23/21 BY [id number]           Culture       CULTURE IN PROGRESS,FURTHER UPDATES TO FOLLOW [facility] ID/Susceptibility if indicated.          Culture, Blood, PCR ID Panel [[id number]] Collected: 08/22/21 1953    Order Status: No result Updated: 08/23/21 0723    Respiratory Panel, Molecular, with COVID-19 (Restricted: peds pts or suitable admitted adults) [[id number]] Collected: 08/22/21 1940    Order Status: Completed Specimen: Nasopharyngeal Updated: 08/22/21 2118     Adenovirus by PCR Not detected        Coronavirus 229E by PCR Not detected        Coronavirus HKU1 by PCR Not detected        Coronavirus NL63 by PCR Not detected        Coronavirus OC43 by PCR Not detected        SARS-CoV-2, PCR Not detected        Human Metapneumovirus by PCR Not detected        Rhinovirus Enterovirus PCR Not detected        Influenza A by PCR Not detected        Influenza B PCR Not detected        Parainfluenza 1 PCR Not detected        Parainfluenza 2 PCR Not detected        Parainfluenza 3 PCR Not detected        Parainfluenza 4 PCR Not detected        Respiratory Syncytial Virus by PCR Not detected        Bordetella parapertussis by PCR Not detected        Bordetella pertussis by PCR Not detected        Chlamydophila Pneumonia PCR Not detected        Mycoplasma pneumo by PCR Not detected                     Imaging:     All available imaging since presentation reviewed as per EPIC

## 2021-08-23 NOTE — ED Provider Notes (Signed)
Patient was seen and evaluated by myself earlier in the day.  Decided to sign out AMA in order to go home and get clothes as well as a blanket, stated that she would return to the emergency department.    Patient did return approximately 2 hours later.  Patient is febrile and tachycardic.  Will be given Tylenol.  Infectious disease Dr. Almyra Free added to treatment team.  Plan to admit the patient for UTI, pyelonephritis, sepsis, and positive blood cultures.    Patient discussed with Dr. Harrell Gave who agrees to admit the patient.    Patient states that she feels very anxious and is requesting medication for her anxiety.  Patient will be given Ativan at this time.    Chart review reveals that the patient received Rocephin at  0051 on 4/10.  Discussed Rocephin dosing with infectious disease, recommends 2 g of Rocephin every 24.  Placed order for 2 g Rocephin every 24 starting on 4/11 0030.      Evert Kohl, PA-C  08/23/21 1936

## 2021-08-23 NOTE — ED Notes (Signed)
Report given to Amy, RN.     Samantha Crimes, RN  08/23/21 2317

## 2021-08-23 NOTE — ED Triage Notes (Signed)
Pt to triage returning for positive blood cultures s/p dx of UTI this morning. Code sepsis initiated.

## 2021-08-23 NOTE — ED Notes (Signed)
Pt alert and orient X 4 wanting to leave facily AMA, signed AMA form PA at bedside and explained about risk of leaving AMA patient verbalized understanding. PIV removed before D/C     Irven [place], RN  08/23/21 1540

## 2021-08-23 NOTE — ED Notes (Signed)
Charted Single lumen PICC line is actually single lumen midline 18 G in right basilic vein. Line can remain up to 28 days. Placement confirmed via [place]. No complications noted during placement.     Illene Silver  08/23/21 2146

## 2021-08-23 NOTE — Discharge Instructions (Addendum)
1.  Take medications as prescribed.  2.  Make an appointment follow-up with your PCP.  3.  Take Tylenol and/or ibuprofen as needed as directed on the container.  4.  Make appointment return to emergency department worsening or concerning symptoms.  5.  Stay well hydrated eat a well-balanced diet.

## 2021-08-24 ENCOUNTER — Inpatient Hospital Stay: Payer: MEDICARE | Primary: Family Medicine

## 2021-08-24 LAB — CBC WITH AUTO DIFFERENTIAL
Absolute Immature Granulocyte: 0.1 10*3/uL — ABNORMAL HIGH (ref 0.00–0.04)
Absolute Immature Granulocyte: 0 10*3/uL (ref 0.00–0.04)
Basophils %: 0 % (ref 0–2)
Basophils %: 0 % (ref 0–2)
Basophils Absolute: 0 10*3/uL (ref 0.0–0.1)
Basophils Absolute: 0 10*3/uL (ref 0.0–0.1)
Eosinophils %: 1 % (ref 0–5)
Eosinophils %: 0 % (ref 0–5)
Eosinophils Absolute: 0 10*3/uL (ref 0.0–0.4)
Eosinophils Absolute: 0 10*3/uL (ref 0.0–0.4)
Hematocrit: 31.7 % — ABNORMAL LOW (ref 35.0–45.0)
Hematocrit: 32 % — ABNORMAL LOW (ref 35.0–45.0)
Hemoglobin: 10.7 g/dL — ABNORMAL LOW (ref 12.0–16.0)
Hemoglobin: 10.6 g/dL — ABNORMAL LOW (ref 12.0–16.0)
Immature Granulocytes: 1 % — ABNORMAL HIGH (ref 0.0–0.5)
Immature Granulocytes: 1 % — ABNORMAL HIGH (ref 0.0–0.5)
Lymphocytes %: 12 % — ABNORMAL LOW (ref 21–52)
Lymphocytes %: 9 % — ABNORMAL LOW (ref 21–52)
Lymphocytes Absolute: 0.8 10*3/uL — ABNORMAL LOW (ref 0.9–3.6)
Lymphocytes Absolute: 0.3 10*3/uL — ABNORMAL LOW (ref 0.9–3.6)
MCH: 30.2 PG (ref 24.0–34.0)
MCH: 29.9 PG (ref 24.0–34.0)
MCHC: 33.8 g/dL (ref 31.0–37.0)
MCHC: 33.1 g/dL (ref 31.0–37.0)
MCV: 89.5 FL (ref 78.0–100.0)
MCV: 90.4 FL (ref 78.0–100.0)
MPV: 12 FL — ABNORMAL HIGH (ref 9.2–11.8)
MPV: 11.8 FL (ref 9.2–11.8)
Monocytes %: 8 % (ref 3–10)
Monocytes %: 4 % (ref 3–10)
Monocytes Absolute: 0.5 10*3/uL (ref 0.05–1.2)
Monocytes Absolute: 0.1 10*3/uL (ref 0.05–1.2)
Neutrophils %: 78 % — ABNORMAL HIGH (ref 40–73)
Neutrophils %: 87 % — ABNORMAL HIGH (ref 40–73)
Neutrophils Absolute: 4.9 10*3/uL (ref 1.8–8.0)
Neutrophils Absolute: 3.1 10*3/uL (ref 1.8–8.0)
Nucleated RBCs: 0 PER 100 WBC
Nucleated RBCs: 0 PER 100 WBC
Platelets: 120 10*3/uL — ABNORMAL LOW (ref 135–420)
Platelets: 115 10*3/uL — ABNORMAL LOW (ref 135–420)
RBC: 3.54 M/uL — ABNORMAL LOW (ref 4.20–5.30)
RBC: 3.54 M/uL — ABNORMAL LOW (ref 4.20–5.30)
RDW: 14.6 % — ABNORMAL HIGH (ref 11.6–14.5)
RDW: 15 % — ABNORMAL HIGH (ref 11.6–14.5)
WBC: 6.2 10*3/uL (ref 4.6–13.2)
WBC: 3.6 10*3/uL — ABNORMAL LOW (ref 4.6–13.2)
nRBC: 0 10*3/uL (ref 0.00–0.01)
nRBC: 0 10*3/uL (ref 0.00–0.01)

## 2021-08-24 LAB — URINE DRUG SCREEN
Amphetamine, Urine: NEGATIVE
Barbiturates, Urine: NEGATIVE
Benzodiazepines, Urine: NEGATIVE
Cocaine, Urine: NEGATIVE
Methadone, Urine: NEGATIVE
Opiates, Urine: NEGATIVE
PCP, Urine: NEGATIVE
THC, TH-Cannabinol, Urine: POSITIVE — AB

## 2021-08-24 LAB — COMPREHENSIVE METABOLIC PANEL
ALT: 17 U/L (ref 13–56)
AST: 34 U/L (ref 10–38)
Albumin/Globulin Ratio: 0.7 — ABNORMAL LOW (ref 0.8–1.7)
Albumin: 2.1 g/dL — ABNORMAL LOW (ref 3.4–5.0)
Alk Phosphatase: 67 U/L (ref 45–117)
Anion Gap: 3 mmol/L (ref 3.0–18)
BUN: 22 MG/DL — ABNORMAL HIGH (ref 7.0–18)
Bun/Cre Ratio: 14 (ref 12–20)
CO2: 20 mmol/L — ABNORMAL LOW (ref 21–32)
Calcium: 7.2 MG/DL — ABNORMAL LOW (ref 8.5–10.1)
Chloride: 118 mmol/L — ABNORMAL HIGH (ref 100–111)
Creatinine: 1.61 MG/DL — ABNORMAL HIGH (ref 0.6–1.3)
Est, Glom Filt Rate: 38 mL/min/{1.73_m2} — ABNORMAL LOW (ref >60)
Globulin: 3.2 g/dL (ref 2.0–4.0)
Glucose: 106 mg/dL — ABNORMAL HIGH (ref 74–99)
Potassium: 3.9 mmol/L (ref 3.5–5.5)
Sodium: 141 mmol/L (ref 136–145)
Total Bilirubin: 0.5 MG/DL (ref 0.2–1.0)
Total Protein: 5.3 g/dL — ABNORMAL LOW (ref 6.4–8.2)

## 2021-08-24 LAB — HEPATIC FUNCTION PANEL
ALT: 18 U/L (ref 13–56)
AST: 24 U/L (ref 10–38)
Albumin/Globulin Ratio: 0.9 (ref 0.8–1.7)
Albumin: 2.3 g/dL — ABNORMAL LOW (ref 3.4–5.0)
Alk Phosphatase: 77 U/L (ref 45–117)
Bilirubin, Direct: 0.2 MG/DL (ref 0.0–0.2)
Globulin: 2.5 g/dL (ref 2.0–4.0)
Total Bilirubin: 0.4 MG/DL (ref 0.2–1.0)
Total Protein: 4.8 g/dL — ABNORMAL LOW (ref 6.4–8.2)

## 2021-08-24 LAB — EKG 12-LEAD
Atrial Rate: 60 {beats}/min
Diagnosis: NORMAL
P Axis: 93 degrees
P-R Interval: 156 ms
Q-T Interval: 446 ms
QRS Duration: 94 ms
QTc Calculation (Bazett): 446 ms
R Axis: 28 degrees
T Axis: 53 degrees
Ventricular Rate: 60 {beats}/min

## 2021-08-24 LAB — COVID-19 & INFLUENZA COMBO
Rapid Influenza A By PCR: NOT DETECTED
Rapid Influenza B By PCR: NOT DETECTED
SARS-CoV-2, PCR: NOT DETECTED

## 2021-08-24 LAB — ETHANOL: Ethanol Lvl: <3 MG/DL (ref 0–3)

## 2021-08-24 LAB — BASIC METABOLIC PANEL
Anion Gap: 6 mmol/L (ref 3.0–18)
BUN: 19 MG/DL — ABNORMAL HIGH (ref 7.0–18)
Bun/Cre Ratio: 15 (ref 12–20)
CO2: 19 mmol/L — ABNORMAL LOW (ref 21–32)
Calcium: 7.3 MG/DL — ABNORMAL LOW (ref 8.5–10.1)
Chloride: 119 mmol/L — ABNORMAL HIGH (ref 100–111)
Creatinine: 1.27 MG/DL (ref 0.6–1.3)
Est, Glom Filt Rate: 50 mL/min/{1.73_m2} — ABNORMAL LOW (ref >60)
Glucose: 111 mg/dL — ABNORMAL HIGH (ref 74–99)
Potassium: 3.4 mmol/L — ABNORMAL LOW (ref 3.5–5.5)
Sodium: 144 mmol/L (ref 136–145)

## 2021-08-24 LAB — POCT GLUCOSE
POC Glucose: 108 mg/dL (ref 70–110)
POC Glucose: 122 mg/dL — ABNORMAL HIGH (ref 70–110)
POC Glucose: 124 mg/dL — ABNORMAL HIGH (ref 70–110)
POC Glucose: 133 mg/dL — ABNORMAL HIGH (ref 70–110)

## 2021-08-24 LAB — PHOSPHORUS: Phosphorus: 1.6 MG/DL — ABNORMAL LOW (ref 2.5–4.9)

## 2021-08-24 LAB — TROPONIN: Troponin, High Sensitivity: 13 ng/L (ref 0–54)

## 2021-08-24 LAB — PROTIME-INR
INR: 5 — ABNORMAL HIGH (ref 0.8–1.2)
Protime: 46.3 s — ABNORMAL HIGH (ref 11.5–15.2)

## 2021-08-24 LAB — LACTIC ACID: Lactic Acid, Plasma: 0.8 MMOL/L (ref 0.4–2.0)

## 2021-08-24 LAB — MAGNESIUM: Magnesium: 1.5 mg/dL — ABNORMAL LOW (ref 1.6–2.6)

## 2021-08-24 LAB — HEMOGLOBIN A1C
Hemoglobin A1C: 5.2 % (ref 4.2–5.6)
eAG: 103 mg/dL

## 2021-08-24 LAB — PROCALCITONIN: Procalcitonin: 1.84 ng/mL

## 2021-08-24 MED ORDER — DEXTROSE 10 % IV BOLUS
INTRAVENOUS | Status: AC | PRN
Start: 2021-08-24 — End: 2021-08-28

## 2021-08-24 MED ORDER — SODIUM CHLORIDE 0.9 % IV BOLUS
0.9 % | Freq: Once | INTRAVENOUS | Status: AC
Start: 2021-08-24 — End: 2021-08-23
  Administered 2021-08-24: 01:00:00 500 mL via INTRAVENOUS

## 2021-08-24 MED ORDER — CITALOPRAM HYDROBROMIDE 20 MG PO TABS
20 MG | Freq: Every day | ORAL | Status: AC
Start: 2021-08-24 — End: 2021-08-28
  Administered 2021-08-24 – 2021-08-28 (×5): 20 mg via ORAL

## 2021-08-24 MED ORDER — POTASSIUM PHOSPHATE 3 MMOL/ML IV SOLN (MIXTURES ONLY)
155 mmol/5 mL | Freq: Once | INTRAVENOUS | Status: AC
Start: 2021-08-24 — End: 2021-08-24
  Administered 2021-08-24: 15:00:00 20 mmol via INTRAVENOUS

## 2021-08-24 MED ORDER — LACTATED RINGERS IV SOLN
INTRAVENOUS | Status: DC
Start: 2021-08-24 — End: 2021-08-24

## 2021-08-24 MED ORDER — TIZANIDINE HCL 2 MG PO TABS
2 MG | Freq: Three times a day (TID) | ORAL | Status: AC | PRN
Start: 2021-08-24 — End: 2021-08-28
  Administered 2021-08-28: 08:00:00 2 mg via ORAL

## 2021-08-24 MED ORDER — METHYLPREDNISOLONE SODIUM SUCC 40 MG IJ SOLR
40 MG | Freq: Two times a day (BID) | INTRAMUSCULAR | Status: DC
Start: 2021-08-24 — End: 2021-08-24
  Administered 2021-08-24: 07:00:00 20 mg via INTRAVENOUS

## 2021-08-24 MED ORDER — PREGABALIN 75 MG PO CAPS
75 MG | Freq: Two times a day (BID) | ORAL | Status: DC
Start: 2021-08-24 — End: 2021-08-24

## 2021-08-24 MED ORDER — PREGABALIN 75 MG PO CAPS
75 MG | Freq: Three times a day (TID) | ORAL | Status: AC
Start: 2021-08-24 — End: 2021-08-25
  Administered 2021-08-24 – 2021-08-25 (×4): 200 mg via ORAL

## 2021-08-24 MED ORDER — NOREPINEPHRINE-SODIUM CHLORIDE 16-0.9 MG/250ML-% IV SOLN
INTRAVENOUS | Status: DC
Start: 2021-08-24 — End: 2021-08-24
  Administered 2021-08-24: 02:00:00 5 ug/min via INTRAVENOUS

## 2021-08-24 MED ORDER — METOPROLOL SUCCINATE ER 50 MG PO TB24
50 MG | Freq: Every day | ORAL | Status: AC
Start: 2021-08-24 — End: 2021-08-25

## 2021-08-24 MED ORDER — NICOTINE 14 MG/24HR TD PT24
1424 MG/24HR | Freq: Every day | TRANSDERMAL | Status: AC
Start: 2021-08-24 — End: 2021-08-28
  Administered 2021-08-24 – 2021-08-28 (×5): 1 via TRANSDERMAL

## 2021-08-24 MED ORDER — FLUDROCORTISONE ACETATE 0.1 MG PO TABS
0.1 MG | Freq: Every day | ORAL | Status: DC
Start: 2021-08-24 — End: 2021-08-24

## 2021-08-24 MED ORDER — DEXTROSE 10 % IV SOLN
10 % | INTRAVENOUS | Status: AC | PRN
Start: 2021-08-24 — End: 2021-08-28

## 2021-08-24 MED ORDER — SODIUM CHLORIDE 0.9 % IV BOLUS
0.9 % | Freq: Once | INTRAVENOUS | Status: AC
Start: 2021-08-24 — End: 2021-08-24
  Administered 2021-08-24: 02:00:00 1000 mL via INTRAVENOUS

## 2021-08-24 MED ORDER — INSULIN LISPRO 100 UNIT/ML IJ SOLN
100 UNIT/ML | Freq: Three times a day (TID) | INTRAMUSCULAR | Status: AC
Start: 2021-08-24 — End: 2021-08-28

## 2021-08-24 MED ORDER — GLUCAGON HCL RDNA (DIAGNOSTIC) 1 MG IJ SOLR
1 MG | INTRAMUSCULAR | Status: AC | PRN
Start: 2021-08-24 — End: 2021-08-28

## 2021-08-24 MED ORDER — LAMOTRIGINE 25 MG PO TABS
25 MG | Freq: Three times a day (TID) | ORAL | Status: DC
Start: 2021-08-24 — End: 2021-08-28
  Administered 2021-08-24 – 2021-08-28 (×9): 25 mg via ORAL

## 2021-08-24 MED ORDER — GLUCOSE 4 G PO CHEW
4 g | ORAL | Status: AC | PRN
Start: 2021-08-24 — End: 2021-08-28

## 2021-08-24 MED ORDER — SODIUM CHLORIDE 0.9 % IV SOLN
0.9 % | Freq: Once | INTRAVENOUS | Status: AC
Start: 2021-08-24 — End: 2021-08-24
  Administered 2021-08-24: 19:00:00 6000 mg via INTRAVENOUS

## 2021-08-24 MED ORDER — ALBUTEROL SULFATE (2.5 MG/3ML) 0.083% IN NEBU
Freq: Four times a day (QID) | RESPIRATORY_TRACT | Status: AC | PRN
Start: 2021-08-24 — End: 2021-08-25

## 2021-08-24 MED ORDER — ASPIRIN 81 MG PO CHEW
81 MG | Freq: Every day | ORAL | Status: AC
Start: 2021-08-24 — End: 2021-08-25
  Administered 2021-08-24 – 2021-08-25 (×2): 81 mg via ORAL

## 2021-08-24 MED ORDER — PANTOPRAZOLE SODIUM 40 MG PO TBEC
40 MG | Freq: Every day | ORAL | Status: AC
Start: 2021-08-24 — End: 2021-08-28
  Administered 2021-08-24 – 2021-08-28 (×5): 40 mg via ORAL

## 2021-08-24 MED FILL — LYRICA 25 MG PO CAPS: 25 MG | ORAL | Qty: 2

## 2021-08-24 MED FILL — NOREPINEPHRINE-SODIUM CHLORIDE 16-0.9 MG/250ML-% IV SOLN: INTRAVENOUS | Qty: 250

## 2021-08-24 MED FILL — SODIUM CHLORIDE 0.9 % IV SOLN: 0.9 % | INTRAVENOUS | Qty: 500

## 2021-08-24 MED FILL — FLUDROCORTISONE ACETATE 0.1 MG PO TABS: 0.1 MG | ORAL | Qty: 1

## 2021-08-24 MED FILL — LAMOTRIGINE 25 MG PO TABS: 25 MG | ORAL | Qty: 1

## 2021-08-24 MED FILL — POTASSIUM PHOSPHATES 15 MMOLE/5ML IV SOLN: 15 MMOLE/5ML | INTRAVENOUS | Qty: 6.67

## 2021-08-24 MED FILL — PANTOPRAZOLE SODIUM 40 MG PO TBEC: 40 MG | ORAL | Qty: 1

## 2021-08-24 MED FILL — SODIUM CHLORIDE 0.9 % IV SOLN: 0.9 % | INTRAVENOUS | Qty: 1000

## 2021-08-24 MED FILL — MAGNESIUM SULFATE 50 % IJ SOLN: 50 % | INTRAMUSCULAR | Qty: 10

## 2021-08-24 MED FILL — CITALOPRAM HYDROBROMIDE 20 MG PO TABS: 20 MG | ORAL | Qty: 1

## 2021-08-24 MED FILL — ACETAMINOPHEN 325 MG PO TABS: 325 MG | ORAL | Qty: 2

## 2021-08-24 MED FILL — CEFTRIAXONE SODIUM 2 G IJ SOLR: 2 g | INTRAMUSCULAR | Qty: 2000

## 2021-08-24 MED FILL — TIZANIDINE HCL 2 MG PO TABS: 2 MG | ORAL | Qty: 1

## 2021-08-24 MED FILL — LACTATED RINGERS IV SOLN: INTRAVENOUS | Qty: 1000

## 2021-08-24 MED FILL — NICOTINE 14 MG/24HR TD PT24: 14 MG/24HR | TRANSDERMAL | Qty: 1

## 2021-08-24 MED FILL — SOLU-MEDROL 40 MG IJ SOLR: 40 MG | INTRAMUSCULAR | Qty: 40

## 2021-08-24 MED FILL — ASPIRIN LOW DOSE 81 MG PO CHEW: 81 MG | ORAL | Qty: 1

## 2021-08-24 NOTE — Progress Notes (Signed)
[facility]  (A Division of [facility])      Consultation Note      Date of Admission: 08/23/2021    Date of Note: 08/24/2021      Reason for Referral: GNR sepsis  Referring Physician: Thamas Jaegers, PA-C    Micro from this admission:   4/9 respiratory viral panel: negative  4/9 blood culture: 4 out of 4 gram-negative - suspect E.coli  4/9 urine cx: >100K GNR  4/10 blood cultures:NGTD x2  4/10 blood cx: ngtd x 2  4/10 rapid influenza a and B PCR: Negative    Current Antimicrobials:    Prior Antimicrobials:  Ceftriaxone 4/9 present        Assessment:         Gram-negative rod sepsis suspect GU origin: 4/9 UA with too numerous to count WBCs, 2+ bacteria, moderate leukocyte esterase, large blood; repeat UA on 6/10 with 6-8 WBCs 3+ bacteria  Suspected pyelonephritis on the right : No evidence of obstruction or hydronephrosis noted on CT scan  Urinary tract infection-awaiting CT scan to rule out pyelonephritis or obstructive uropathy  Right lower lobe pulmonary nodule 3 mm  Bipolar disorder type I with anxiety  COPD  CHF  History of aortic valve replacement    Plan:   Continue with ceftriaxone for gram-negative rod sepsis.  Awaiting final identification and speciation from 4/9 blood cultures.- suspect E.coli  Follow-up 4/10 blood cultures-May negative thus far  Trend CBC, BMP, procalcitonin    Advised patient that case manager will be in 2 rounds on Mary [name] and address some of Mary [name] concerns.  She tells me she has an appointment with cardiology on Friday to review recent echocardiogram and she would like to be home by then.     Discussed with ICU team and nursing.     Helyn Numbers, DO  [facility]  [address], [address], [place], [place] [address]  Office: [phone], Ext [phone]      Lines / Catheters:  peripheral      Subjective:   Patient seen and examined.  Since I last saw Mary [name] in the ER she had left AMA and then had come back.  She was  readmitted to the ICU requiring pressors overnight.  She tells me that today she is having shortness of breath and wheezing and is asking for a breathing treatment.  She cannot find Mary [name] home albuterol.  She is very anxious and is asking to speak to a case manager regarding Mary [name] Medicaid phone.  She feels as though someone has stolen the [name].  She has since been downgraded from the ICU is transferred to a stepdown unit.  She states that she is having left-sided flank pain.        HPI:  Mary [name] is a 56 year old female with a history of CHF, COPD, anxiety with bipolar 1 disorder who presented to the emergency department yesterday evening secondary to fevers chills and generalized malaise.  She been having fevers since Friday and had a Tmax of 101.7.  She had called EMS which brought Mary [name] to the emergency department where she was noted to have hypotension with BPs of 85/55.  At that time she was diagnosed with a UTI given IV fluids and was discharged home on Keflex.  She was called back to the ER earlier this morning secondary to blood cultures being noted to be positive in 4 out of 4 bottles from Mary [name] recent  ED visit.  She continues to have ongoing fevers tachycardia and chills.  She has some right-sided flank pain.  She had been given a dose of ceftriaxone yesterday and is doing for a second dose of ceftriaxone this evening.  Overall she feels slightly better than she had yesterday although not too much.  No associated nausea vomiting or diarrhea.  Poor appetite           Objective:      BP 101/63   Pulse 83   Temp 98.4 F (36.9 C) (Oral)   Resp 23   Ht  (1.6 m)   Wt 194 lb (88 kg)   SpO2 93%   BMI 34.37 kg/m   Temp (24hrs), Avg:100.6 F (38.1 C), Min:98.4 F (36.9 C), Max:102.8 F (39.3 C)        General:   awake alert and oriented   Skin:   no rashes or skin lesions noted on limited exam   HEENT:  Normocephalic, atraumatic, PERRL, EOMI, no scleral icterus or pallor; no  conjunctival hemmohage;    Lymph Nodes:   no cervical or inguinal adenopathy   Lungs:   non-labored, occasional wheezing bilaterally, diminished at bases   Heart:  RRR, s1 and s2; no murmurs rubs or gallops, no edema, + pedal pulses   Abdomen:  soft, non-distended, active bowel sounds. Non-tender   Genitourinary:  deferred   Extremities:   no clubbing, cyanosis; no joint effusions or swelling; Full ROM of all large joints to the upper and lower extremities; muscle mass appropriate for age   Neurologic:  No gross focal sensory abnormalities; equal muscle strength to upper and lower extremities. Speech appropirate. Cranial nerves intact   Psychiatric:  Anxious, pressured speech       Labs: Results:   Chemistry Recent Labs     08/23/21  1040 08/23/21  1714 08/23/21  2230 08/24/21  0530   NA 138 140 141 144   K 3.2* 4.0 3.9 3.4*   CL 110 114* 118* 119*   CO2 19* 20* 20* 19*   BUN 21* 20* 22* 19*   GLOB 2.9  --  3.2 2.5      CBC w/Diff Recent Labs     08/23/21  1714 08/23/21  2230 08/24/21  1005   WBC 7.9 6.2 3.6*   RBC 3.98* 3.54* 3.54*   HGB 11.9* 10.7* 10.6*   HCT 34.4* 31.7* 32.0*   PLT 123* 120* 115*            No results found for: SDES No components found for: CULT     Results       Procedure Component Value Units Date/Time    Culture, MRSA, Screening [[id number]]     Order Status: Sent Specimen: Nares     Culture, Blood 1 [[id number]] Collected: 08/23/21 2230    Order Status: Completed Specimen: Blood Updated: 08/24/21 0721     Special Requests NO SPECIAL REQUESTS        Culture NO GROWTH AFTER 8 HOURS       Blood Culture 2 [[id number]] Collected: 08/23/21 2220    Order Status: Completed Specimen: Blood Updated: 08/24/21 0721     Special Requests NO SPECIAL REQUESTS        Culture NO GROWTH AFTER 8 HOURS       COVID-19 & Influenza Combo [[id number]] Collected: 08/23/21 2200    Order Status: Completed Specimen: Nasopharyngeal Updated: 08/23/21 2323     SARS-CoV-2, PCR Not detected  Comment: Not Detected  results do not preclude SARS-CoV-2 infection and should not be used as the sole basis for patient management decisions.Results must be combined with clinical observations, patient history, and epidemiological information.        Rapid Influenza A By PCR Not detected        Rapid Influenza B By PCR Not detected        Comment: Testing was performed using cobas Liat SARS-CoV-2 and Influenza A/B nucleic acid assay.  This test is a multiplex Real-Time Reverse Transcriptase Polymerase Chain Reaction (RT-PCR) based in vitro diagnostic test intended for the qualitative detection of nucleic acids from SARS-CoV-2, Influenza A, and Influenza B in nasopharyngeal for use under the FDA's Emergency Use Authorization(EAU) only.       Fact sheet for Patients: [email or web address]://www.fda.gov/media/142192/download  Fact sheet for Healthcare Providers: https://www.fda.fov/media/142191/download         Culture, Blood 2 [[id number]][[phone]] Collected: 08/23/21 1059    Order Status: Completed Specimen: Blood Updated: 08/24/21 0721     Special Requests NO SPECIAL REQUESTS        Culture NO GROWTH AFTER 20 HOURS       COVID-19 & Influenza Combo [[id number]][[phone]] Collected: 08/23/21 1054    Order Status: Completed Specimen: Nasopharyngeal Updated: 08/23/21 1130     SARS-CoV-2, PCR Not detected        Comment: Not Detected results do not preclude SARS-CoV-2 infection and should not be used as the sole basis for patient management decisions.Results must be combined with clinical observations, patient history, and epidemiological information.        Rapid Influenza A By PCR Not detected        Rapid Influenza B By PCR Not detected        Comment: Testing was performed using cobas Liat SARS-CoV-2 and Influenza A/B nucleic acid assay.  This test is a multiplex Real-Time Reverse Transcriptase Polymerase Chain Reaction (RT-PCR) based in vitro diagnostic test intended for the qualitative detection of nucleic acids from SARS-CoV-2, Influenza A, and Influenza B in  nasopharyngeal for use under the FDA's Emergency Use Authorization(EAU) only.       Fact sheet for Patients: [email or web address]://www.fda.gov/media/142192/download  Fact sheet for Healthcare Providers: https://www.fda.fov/media/142191/download         Culture, Blood 1 [[id number]][[phone]] Collected: 08/23/21 1039    Order Status: Completed Specimen: Blood Updated: 08/24/21 0655     Special Requests NO SPECIAL REQUESTS        Culture NO GROWTH AFTER 20 HOURS       Culture, Urine [[id number]][[phone]]  (Abnormal) Collected: 08/22/21 2005    Order Status: Completed Specimen: Urine, clean catch Updated: 08/24/21 1342     Special Requests NO SPECIAL REQUESTS        Colony count --        >100,000  COLONIES/mL       Culture Gram negative rods       Blood Culture 2 [[id number]][[id number]]  (Abnormal) Collected: 08/22/21 1959    Order Status: Completed Specimen: Blood Updated: 08/23/21 1423     Special Requests NO SPECIAL REQUESTS        Gram stain       AEROBIC AND ANAEROBIC BOTTLES Gram negative rods                  SMEAR CALLED TO AND CORRECTLY REPEATED BY: PA D POOLE. ED. 0820. 08/23/21 BY [id number]           Culture       Gram negative  rods GROWING IN BOTH BOTTLES DRAWN No Site Indicated          Culture, Blood, PCR ID Panel [[id number]] Collected: 08/22/21 1954    Order Status: Canceled     Culture, Blood 1 [[id number]]  (Abnormal) Collected: 08/22/21 1953    Order Status: Completed Specimen: Blood Updated: 08/23/21 1530     Special Requests NO SPECIAL REQUESTS        Gram stain       AEROBIC AND ANAEROBIC BOTTLES Gram negative rods                  SMEAR CALLED TO AND CORRECTLY REPEATED BY: PA D POOLE, ED, 0820, 08/23/21 BY [id number]           Culture       PROBABLE Escherichia coli GROWING IN BOTH BOTTLES DRAWN SITE=RAC          Culture, Blood, PCR ID Panel [[id number]]  (Abnormal) Collected: 08/22/21 1953    Order Status: Completed Specimen: Blood Updated: 08/23/21 1533     Accession Number [id number]     Enterococcus faecalis by PCR Not detected        Enterococcus  faecium by PCR Not detected        Listeria monocytogenes by PCR Not detected        STAPHYLOCOCCUS Not detected        Staphylococcus Aureus Not detected        Staphylococcus epidermidis by PCR Not detected        Staphylococcus lugdunensis by PCR Not detected        STREPTOCOCCUS Not detected        Streptococcus agalactiae (Group B) Not detected        Strep pneumoniae Not detected        Strep pyogenes,(Grp. A) Not detected        Acinetobacter calcoac baumannii complex by PCR Not detected        Bacteroides fragilis by PCR Not detected        Enterobacteriaceae by PCR Detected        Enterobacter cloacae complex by PCR Not detected        Escherichia Coli Detected        Klebsiella aerogenes by PCR Not detected        Klebsiella oxytoca by PCR Not detected        Klebsiella pneumoniae group by PCR Not detected        Proteus by PCR Not detected        Salmonella species by PCR Not detected        Serratia marcescens by PCR Not detected        Haemophilus Influenzae by PCR Not detected        Neisseria meningitidis by PCR Not detected        Pseudomonas aeruginosa Not detected        Stenotrophomonas maltophilia by PCR Not detected        Candida albicans by PCR Not detected        Candida auris by PCR Not detected        Candida glabrata Not detected        Candida krusei by PCR Not detected        Candida parapsilosis by PCR Not detected        Candida tropicalis by PCR Not detected        Cryptococcus neoformans/gattii by PCR Not detected  Resistant gene targets            Resistant gene ctx-m by PCR Not detected        Resistant gene imp by PCR Not detected        KPC (Carbapenem resistance gene) Not detected        Colistin Resistance mcr-1 gene by PCR Not detected        Resistant gene ndm by PCR Not detected        Resistant gene oxa-48-like by pcr Not detected        Resistant gene vim by PCR Not detected        [employer] test comment       False positive results may rarely occur. Correlate with  clinical,epidemiologic, and other laboratory findings           Comment: Please see [facility] Interpretation Guide in EPIC Links       Respiratory Panel, Molecular, with COVID-19 (Restricted: peds pts or suitable admitted adults) [[id number]] Collected: 08/22/21 1940    Order Status: Completed Specimen: Nasopharyngeal Updated: 08/22/21 2118     Adenovirus by PCR Not detected        Coronavirus 229E by PCR Not detected        Coronavirus HKU1 by PCR Not detected        Coronavirus NL63 by PCR Not detected        Coronavirus OC43 by PCR Not detected        SARS-CoV-2, PCR Not detected        Human Metapneumovirus by PCR Not detected        Rhinovirus Enterovirus PCR Not detected        Influenza A by PCR Not detected        Influenza B PCR Not detected        Parainfluenza 1 PCR Not detected        Parainfluenza 2 PCR Not detected        Parainfluenza 3 PCR Not detected        Parainfluenza 4 PCR Not detected        Respiratory Syncytial Virus by PCR Not detected        Bordetella parapertussis by PCR Not detected        Bordetella pertussis by PCR Not detected        Chlamydophila Pneumonia PCR Not detected        Mycoplasma pneumo by PCR Not detected                     Imaging:     All available imaging since presentation reviewed as per EPIC

## 2021-08-24 NOTE — ED Notes (Signed)
Pt remains sleeping w/response to pain and on Pressors for BP. Stable vitals at this time    Await bed placement.     Kiefer Opheim Lutz-Sexton, RN  08/24/21 [phone]

## 2021-08-24 NOTE — H&P (Signed)
[facility]  Pulmonary, Critical Care, and Sleep Medicine    Name: Mary [name] MRN: [id number]   DOB: Oct 09, 1965 Hospital: [facility]   Date: 08/24/2021        Critical Care History and Physical      IMPRESSION:   Septic shock - febrile, tachycardic; in the setting of e coli bacteremia  E Coli bacteremia - 2/2 source UTI  UTI - culture pending, presumed to be GNR as well  AKI - in the setting of sepsis  Acute encephalopathy - likely 2/2 benzo  Elevated INR in the setting of coumadin use  Hx of bioprosthetic mitral valve replacement in 2017 - on coumadin as an outpatient  Hx of bipolar disorder  Hx of diastolic heart failure  Hx of CAD     Patient Active Problem List   Diagnosis    Sensorineural hearing loss (SNHL) of both ears    Onychomycosis    Rib pain on left side    DDD (degenerative disc disease), lumbosacral    Dyslipidemia    Nicotine dependence    Severe obesity with body mass index (BMI) of 35.0 to 39.9 with serious comorbidity (HCC)    Asthma    Antral gastritis    Osteopenia    Hyperlipidemia    Diabetes mellitus (HCC)    COVID-19    Abrasion of right middle finger with infection    Chest congestion    Elevated INR    Moderate to severe pulmonary hypertension (HCC)    Onycholysis    Nodule of right lung    Respiratory failure (HCC)    Diastolic CHF, chronic (HCC)    Allergic rhinitis    Facial neuropathy, traumatic, left, sequela    History of mitral valve replacement with bioprosthetic valve    Severe obesity (BMI >= 40) (HCC)    Panlobular emphysema (HCC)    Anxiety disorder    Bipolar affective disorder, depressed in partial remission (HCC)    Suppurative tenosynovitis of flexor tendon of right hand    Chronic obstructive pulmonary disease (HCC)    Bipolar I disorder with depression, severe (HCC)    Heart valve pulmonary stenosis    TIA (transient ischemic attack)    Cellulitis and abscess of hand    Lumbar compression fracture (HCC)    Afib (HCC)    Abscess of  finger    Mitral regurgitation    Chronic pain syndrome    Allergy    Mitral valve replaced    Chronic bilateral thoracic back pain    Thoracic compression fracture, sequela    Sepsis (HCC)    Bacteremia    Septic shock (HCC)        RECOMMENDATIONS:   Neuro: avoid sedating medications, will unhold psych medications when able to take PO  Pulm: Supplemental O2 via NC, titrate flow for goal SPO2> 88-90% Bronchodilators, steroids, pulmonary hygiene care, Aspiration precautions, Keep HOB >30 degrees  CVS: Actively titrate vasopressors aim MAP >38mmHg, Check cardiac panel, ECHO results.   Fluids: n/a  GI: SUP, keep NPO while drowsy  Renal: Trend Renal indices, Strict Is/Os  Hem/Onc: Monitor for s/o active bleeding. Will start heparin gtt in setting of bioprosthetic valve  I/D:Sepsis bundle per hospital protocol, Blood and Urine cultures drawn and will be followed. Bcx + for e coli in all samples. Lactic acid ordered- initial and repeat Q4hrs till normalized. Antibiotics: ceftriaxone. Will add stress dose steroids. ID following. Trend WBCs and temperature curve.  Endocrine: Q6 glucoses, SSI.  Metabolic:  Daily BMP, mag, phos. Trend lytes, replace as needed.   Musc/Skin: no acute issues  Full Code      Best practice : APPLICABLE TO PATIENT    Glycemic control  Stress ulcer prophylaxis.   Protonix  DVT prophylaxis.   Heparin gtt  Need for Lines, foley assessed.  Palliative care evaluation.  Restraints need.  Attending Non-violent Restraint Reevaluation   N/a        Subjective/History:     This patient has been seen and evaluated at the request of Dr. Barrington Ellison for septic shock.    08/24/21    Patient is a 56 y.o. female w/ pmhx of diastolic heart failure, COPD and bipolar disorder who presented to the ED originally on 4/9 complaining of flank pain, fever and dysuria. Work up significant for UTI and patient was given ceftriaxone x1 and discharged on keflex. She returned 4/10 after being called back for bcx + for GNR in all  samples. When she returned, she stated that she felt worse complaining of increased fevers and dysuria. ID consulted, CT abd/pelvis done concerning for pyelonephritis. While undergoing work up and treatment, patient signed out AMA stating she needed her belongings. She returned a few hours later and was admitted for severe sepsis 2/2 UTI. She reportedly was complaining of feeling very anxious and was given 1 mg ativan x1. Later that evening, patient developed worsening hypotension refractory to fluid boluses and levophed gtt started. Noted to be more lethargic.  ICU consulted for further management and treatment.    On my evaluation, patient somnolent but arousable to loud voice. When awake, answers questions appropriately and quickly falls back asleep. Currently on a moderate amount of levophed.       Past Medical History:   Diagnosis Date    Anxiety     Bipolar 1 disorder (HCC)     CHF (congestive heart failure) (HCC)     COPD (chronic obstructive pulmonary disease) (HCC)     COPD (chronic obstructive pulmonary disease) (HCC)     H/O aortic valve replacement     PTSD (post-traumatic stress disorder)         Past Surgical History:   Procedure Laterality Date    CARDIAC CATHETERIZATION      CARDIAC VALVE SURGERY      mitral valve replacement    HAND/FINGER SURGERY UNLISTED Right     I&D right middle finger        Prior to Admission medications    Medication Sig Start Date End Date Taking? Authorizing Provider   cephALEXin (KEFLEX) 500 MG capsule Take 1 capsule by mouth 4 times daily for 10 days  Patient not taking: Reported on 08/23/2021 08/23/21 09/02/21  Norvel Richards, MD   empagliflozin (JARDIANCE) 10 MG tablet Take 1 tablet by mouth daily  Patient not taking: Reported on 08/23/2021 08/06/21   Sandre Kitty, MD   pravastatin (PRAVACHOL) 40 MG tablet Take 1 tablet by mouth daily 08/06/21   Sandre Kitty, MD   metoprolol succinate (TOPROL XL) 25 MG extended release tablet Take 1 tablet by mouth daily 08/06/21   Sandre Kitty, MD   warfarin (COUMADIN) 2.5 MG tablet Take 2 tablets by mouth daily 08/06/21   Sandre Kitty, MD   pregabalin (LYRICA) 200 MG capsule take 1 capsule by mouth three times a day 07/21/21 07/22/11  Valetta Fuller, APRN - NP   albuterol sulfate HFA (PROVENTIL;VENTOLIN;PROAIR) 108 (90  Base) MCG/ACT inhaler Inhale 2 puffs into the lungs every 6 hours as needed 11/19/19   Ar Automatic Reconciliation   aspirin 81 MG chewable tablet Take 1 tablet by mouth daily    Ar Automatic Reconciliation   cetirizine (ZYRTEC) 10 MG tablet Take 1 tablet by mouth daily 10/23/19   Ar Automatic Reconciliation   cyanocobalamin 1000 MCG tablet cyanocobalamin (vit B-12) 1,000 mcg tablet   take 1 tablet by mouth daily as directed  Patient not taking: Reported on 08/23/2021    Ar Automatic Reconciliation   docusate (COLACE, DULCOLAX) 100 MG CAPS Col-Rite 100 mg capsule   take 1 capsule by mouth twice a day if needed for constipation    Ar Automatic Reconciliation   fluticasone (FLONASE) 50 MCG/ACT nasal spray 1 spray by nasal route daily 05/09/20   Ar Automatic Reconciliation   furosemide (LASIX) 40 MG tablet Take 1 tablet by mouth daily as needed    Ar Automatic Reconciliation   hydrOXYzine HCl (ATARAX) 25 MG tablet Take 25 mg by mouth 2 times daily as needed  Patient not taking: No sig reported    Ar Automatic Reconciliation   albuterol-ipratropium (COMBIVENT RESPIMAT) 20-100 MCG/ACT AERS inhaler Inhale 1 puff into the lungs 4 times daily 05/09/20   Ar Automatic Reconciliation   lamoTRIgine (LAMICTAL) 25 MG tablet Take 75 mg by mouth daily  Patient not taking: Reported on 08/23/2021 12/09/19   Ar Automatic Reconciliation   omeprazole (PRILOSEC) 40 MG delayed release capsule Take 1 capsule by mouth daily 11/17/19   Ar Automatic Reconciliation   tiZANidine (ZANAFLEX) 2 MG tablet Take 2 mg by mouth 3 times daily as needed  Patient not taking: Reported on 07/12/2021 01/11/21   Ar Automatic Reconciliation       Allergies   Allergen Reactions    Alprazolam  Other (See Comments)     Gets suicidal    Diazepam Other (See Comments)     Gets suicidal    Tobacco     Adhesive Tape Rash    Lithium Nausea And Vomiting        Social History     Tobacco Use    Smoking status: Every Day     Packs/day: 1.00     Types: Cigarettes    Smokeless tobacco: Never   Substance Use Topics    Alcohol use: Never        Family History   Problem Relation Age of Onset    No Known Problems Mother           Review of Systems:  Review of systems not obtained due to patient factors.    Objective:   Vital Signs:    BP 118/62   Pulse 66   Temp (!) 100.9 F (38.3 C) (Oral)   Resp 18   Ht 5\' 3"  (1.6 m)   Wt 194 lb (88 kg)   SpO2 94%   BMI 34.37 kg/m             Temp (24hrs), Avg:101 F (38.3 C), Min:99.2 F (37.3 C), Max:102.8 F (39.3 C)       Intake/Output:   Last shift:      No intake/output data recorded.  Last 3 shifts: No intake/output data recorded.  No intake or output data in the 24 hours ending 08/24/21 0007        Physical Exam:     General:  Somnolent, arouses to voice   Head:  Normocephalic, without obvious abnormality, atraumatic.  Eyes:  Conjunctivae/corneas clear. PERRL   Nose: Nares normal. Septum midline. Mucosa normal. No drainage or sinus tenderness.   Throat: Lips, mucosa, and tongue normal. Edentulous.   Neck: Supple, symmetrical, trachea midline   Lungs:   Mild rhonchi that clears with spontaneous coughing   Chest wall:  No tenderness or deformity.   Heart:  Regular rate and rhythm, S1, S2 normal, no murmur, click, rub or gallop.   Abdomen:   Soft, non-tender. Bowel sounds normal. No masses,   Extremities: Extremities normal, atraumatic, no cyanosis or edema.   Pulses: 2+ and symmetric all extremities.   Skin: Skin color, texture, turgor normal. No rashes or lesions   Neurologic: Somnolent, arouses to voice, responds appropriately, oriented x4 and follows commands     Devices:  ETT: (-)  OGT:)-)  Lines: RUE midline  Drains: (-)  Foley: (-)    Data:   I have  independently reviewed and interpreted all labs, imaging data and additional testing relevant to patient's care and clinical decision making.      Lab Results   Component Value Date    WBC 6.2 08/23/2021    HGB 10.7 (L) 08/23/2021    HCT 31.7 (L) 08/23/2021    MCV 89.5 08/23/2021    PLT 120 (L) 08/23/2021    LYMPHOPCT 12 (L) 08/23/2021    RBC 3.54 (L) 08/23/2021    MCH 30.2 08/23/2021    MCHC 33.8 08/23/2021    RDW 14.6 (H) 08/23/2021     Lab Results   Component Value Date    NA 141 08/23/2021    K 3.9 08/23/2021    CL 118 (H) 08/23/2021    CO2 20 (L) 08/23/2021    BUN 22 (H) 08/23/2021    CREATININE 1.61 (H) 08/23/2021    GLUCOSE 106 (H) 08/23/2021    CALCIUM 7.2 (L) 08/23/2021    PROT 5.3 (L) 08/23/2021    LABALBU 2.1 (L) 08/23/2021    BILITOT 0.5 08/23/2021    ALKPHOS 67 08/23/2021    AST 34 08/23/2021    ALT 17 08/23/2021    LABGLOM 38 (L) 08/23/2021    GFRAA >60 12/09/2020    AGRATIO 1.1 12/09/2020    GLOB 3.2 08/23/2021        Lab Results   Component Value Date    ALT 17 08/23/2021    AST 34 08/23/2021    ALKPHOS 67 08/23/2021    BILITOT 0.5 08/23/2021      Lab Results   Component Value Date    TSH 1.88 04/22/2020      No results found for: LABA1C    Lab Results   Component Value Date    LABALBU 2.1 (L) 08/23/2021       Results       Procedure Component Value Units Date/Time    Culture, MRSA, Screening [[id number]]     Order Status: Sent Specimen: Nares     Culture, Blood 1 [[id number]] Collected: 08/23/21 2230    Order Status: Completed Specimen: Blood Updated: 08/23/21 2257     Special Requests NO SPECIAL REQUESTS        Culture PENDING    Blood Culture 2 [[id number]] Collected: 08/23/21 2220    Order Status: Completed Specimen: Blood Updated: 08/23/21 2257     Special Requests NO SPECIAL REQUESTS        Culture PENDING    COVID-19 & Influenza Combo [[id number]] Collected: 08/23/21 2200    Order Status: Completed Specimen: Nasopharyngeal Updated: 08/23/21  2323     SARS-CoV-2, PCR Not detected        Comment:  Not Detected results do not preclude SARS-CoV-2 infection and should not be used as the sole basis for patient management decisions.Results must be combined with clinical observations, patient history, and epidemiological information.        Rapid Influenza A By PCR Not detected        Rapid Influenza B By PCR Not detected        Comment: Testing was performed using cobas Liat SARS-CoV-2 and Influenza A/B nucleic acid assay.  This test is a multiplex Real-Time Reverse Transcriptase Polymerase Chain Reaction (RT-PCR) based in vitro diagnostic test intended for the qualitative detection of nucleic acids from SARS-CoV-2, Influenza A, and Influenza B in nasopharyngeal for use under the FDA's Emergency Use Authorization(EAU) only.       Fact sheet for Patients: SalonClasses.at  Fact sheet for Healthcare Providers: https://www.fda.fov/media/142191/download         Culture, Blood 2 [[id number]] Collected: 08/23/21 1059    Order Status: Sent Specimen: Blood Updated: 08/23/21 1144    COVID-19 & Influenza Combo [[id number]] Collected: 08/23/21 1054    Order Status: Completed Specimen: Nasopharyngeal Updated: 08/23/21 1130     SARS-CoV-2, PCR Not detected        Comment: Not Detected results do not preclude SARS-CoV-2 infection and should not be used as the sole basis for patient management decisions.Results must be combined with clinical observations, patient history, and epidemiological information.        Rapid Influenza A By PCR Not detected        Rapid Influenza B By PCR Not detected        Comment: Testing was performed using cobas Liat SARS-CoV-2 and Influenza A/B nucleic acid assay.  This test is a multiplex Real-Time Reverse Transcriptase Polymerase Chain Reaction (RT-PCR) based in vitro diagnostic test intended for the qualitative detection of nucleic acids from SARS-CoV-2, Influenza A, and Influenza B in nasopharyngeal for use under the FDA's Emergency Use Authorization(EAU) only.        Fact sheet for Patients: SalonClasses.at  Fact sheet for Healthcare Providers: https://www.fda.fov/media/142191/download         Culture, Blood 1 [[id number]] Collected: 08/23/21 1039    Order Status: Sent Specimen: Blood Updated: 08/23/21 1105    Culture, Urine [[id number]] Collected: 08/22/21 2005    Order Status: Sent Specimen: Urine, clean catch Updated: 08/23/21 2251    Blood Culture 2 [[id number]]  (Abnormal) Collected: 08/22/21 1959    Order Status: Completed Specimen: Blood Updated: 08/23/21 1423     Special Requests NO SPECIAL REQUESTS        Gram stain       AEROBIC AND ANAEROBIC BOTTLES Gram negative rods                  SMEAR CALLED TO AND CORRECTLY REPEATED BY: PA D POOLE. ED. 0820. 08/23/21 BY [id number]           Culture       Gram negative rods GROWING IN BOTH BOTTLES DRAWN No Site Indicated          Culture, Blood, PCR ID Panel [[id number]] Collected: 08/22/21 1954    Order Status: Canceled     Culture, Blood 1 [[id number]]  (Abnormal) Collected: 08/22/21 1953    Order Status: Completed Specimen: Blood Updated: 08/23/21 1530     Special Requests NO SPECIAL REQUESTS        Gram stain  AEROBIC AND ANAEROBIC BOTTLES Gram negative rods                  SMEAR CALLED TO AND CORRECTLY REPEATED BY: PA D POOLE, ED, 0820, 08/23/21 BY [id number]           Culture       PROBABLE Escherichia coli GROWING IN BOTH BOTTLES DRAWN SITE=RAC          Culture, Blood, PCR ID Panel [[id number]]  (Abnormal) Collected: 08/22/21 1953    Order Status: Completed Specimen: Blood Updated: 08/23/21 1533     Accession Number [id number]     Enterococcus faecalis by PCR Not detected        Enterococcus faecium by PCR Not detected        Listeria monocytogenes by PCR Not detected        STAPHYLOCOCCUS Not detected        Staphylococcus Aureus Not detected        Staphylococcus epidermidis by PCR Not detected        Staphylococcus lugdunensis by PCR Not detected        STREPTOCOCCUS Not detected        Streptococcus  agalactiae (Group B) Not detected        Strep pneumoniae Not detected        Strep pyogenes,(Grp. A) Not detected        Acinetobacter calcoac baumannii complex by PCR Not detected        Bacteroides fragilis by PCR Not detected        Enterobacteriaceae by PCR Detected        Enterobacter cloacae complex by PCR Not detected        Escherichia Coli Detected        Klebsiella aerogenes by PCR Not detected        Klebsiella oxytoca by PCR Not detected        Klebsiella pneumoniae group by PCR Not detected        Proteus by PCR Not detected        Salmonella species by PCR Not detected        Serratia marcescens by PCR Not detected        Haemophilus Influenzae by PCR Not detected        Neisseria meningitidis by PCR Not detected        Pseudomonas aeruginosa Not detected        Stenotrophomonas maltophilia by PCR Not detected        Candida albicans by PCR Not detected        Candida auris by PCR Not detected        Candida glabrata Not detected        Candida krusei by PCR Not detected        Candida parapsilosis by PCR Not detected        Candida tropicalis by PCR Not detected        Cryptococcus neoformans/gattii by PCR Not detected        Resistant gene targets            Resistant gene ctx-m by PCR Not detected        Resistant gene imp by PCR Not detected        KPC (Carbapenem resistance gene) Not detected        Colistin Resistance mcr-1 gene by PCR Not detected        Resistant gene ndm by PCR Not detected  Resistant gene oxa-48-like by pcr Not detected        Resistant gene vim by PCR Not detected        [employer] test comment       False positive results may rarely occur. Correlate with clinical,epidemiologic, and other laboratory findings           Comment: Please see BCID Interpretation Guide in EPIC Links       Respiratory Panel, Molecular, with COVID-19 (Restricted: peds pts or suitable admitted adults) [[id number]] Collected: 08/22/21 1940    Order Status: Completed Specimen: Nasopharyngeal  Updated: 08/22/21 2118     Adenovirus by PCR Not detected        Coronavirus 229E by PCR Not detected        Coronavirus HKU1 by PCR Not detected        Coronavirus NL63 by PCR Not detected        Coronavirus OC43 by PCR Not detected        SARS-CoV-2, PCR Not detected        Human Metapneumovirus by PCR Not detected        Rhinovirus Enterovirus PCR Not detected        Influenza A by PCR Not detected        Influenza B PCR Not detected        Parainfluenza 1 PCR Not detected        Parainfluenza 2 PCR Not detected        Parainfluenza 3 PCR Not detected        Parainfluenza 4 PCR Not detected        Respiratory Syncytial Virus by PCR Not detected        Bordetella parapertussis by PCR Not detected        Bordetella pertussis by PCR Not detected        Chlamydophila Pneumonia PCR Not detected        Mycoplasma pneumo by PCR Not detected                 Telemetry:normal sinus rhythm    Imaging:  I have personally reviewed the patient's radiographs and have reviewed the reports:    Recent image results  CT ABDOMEN PELVIS W IV CONTRAST Additional Contrast? None    Result Date: 08/23/2021  1. Mildly edematous right kidney, with perirenal stranding. Trace left perirenal stranding. Bilateral renal pelvis wall thickening and enhancement. Findings are concerning for upper urinary tract infection/potentially very early pyelonephritis (no striated renal enhancement at this time). -Probable minimally complex cyst right kidney 2. Gallbladder is mildly distended, but no wall thickening or pericholecystic fluid, and no stones visible by CT. Nonspecific. 3. Mild colonic diverticulosis. 4. Additional chronic findings as above.    XR CHEST PORTABLE    Result Date: 08/23/2021  Mild vascular congestion with no focal pneumonia or consolidation.    XR CHEST PORTABLE    Result Date: 08/22/2021  No acute findings.        No results found for this or any previous visit.     Ultrasound Result (most recent):  No results found for this or any  previous visit from the past 3650 days.      08/20/21    TRANSTHORACIC ECHOCARDIOGRAM (TTE) COMPLETE (CONTRAST/BUBBLE/3D PRN) 08/20/2021  5:24 PM (Final)    Interpretation Summary    Left Ventricle: Normal left ventricular systolic function with a visually estimated EF of 55 - 60%. Left ventricle is mildly dilated. Increased wall thickness. See  diagram for wall motion findings. Diastolic function present with increased LAP with normal LV EF.    Right Ventricle: Right ventricle is mildly dilated. RVIDd is 4.3 cm.    Aortic Valve: Moderate regurgitation with a centrally directed jet. Vena contracta measures 0.5 cm.    Mitral Valve: Bioprosthetic valve that is well-seated with normal leaflet motion. MV mean gradient is 7 mmHg. Moderate regurgitation.    Left Atrium: Left atrium is mildly dilated. Left atrial volume index is mildly increased (35-41 mL/m2). LA Vol Index A/L is 38 mL/m2.    Pulmonary Arteries: Pulmonary hypertension not present. The estimated PASP is 30 mmHg.    Signed by: Sandre Kitty, MD on 08/20/2021  5:24 PM          APP time - 30 min  Su Monks, PA-C  08/24/21  Pulmonary, Critical Care Medicine  [facility]
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
## 2021-08-24 NOTE — Progress Notes (Unsigned)
HISTORY OF PRESENT ILLNESS  Mary [name] is a 56 y.o. female.    PMH History of aortic valve replacement Bioprosthetic, CHF, Bipolar, Anxiety, Left atrial clip,   -----  CARDIAC STUDIES  -----    Left Ventricle: Normal left ventricular systolic function with a visually estimated EF of 55 - 60%. Left ventricle is mildly dilated. Increased wall thickness. See diagram for wall motion findings. Diastolic function present with increased LAP with normal LV EF.    Right Ventricle: Right ventricle is mildly dilated. RVIDd is 4.3 cm.    Aortic Valve: Moderate regurgitation with a centrally directed jet. Vena contracta measures 0.5 cm.    Mitral Valve: Bioprosthetic valve that is well-seated with normal leaflet motion. MV mean gradient is 7 mmHg. Moderate regurgitation.    Left Atrium: Left atrium is mildly dilated. Left atrial volume index is mildly increased (35-41 mL/m2). LA Vol Index A/L is 38 mL/m2.    Pulmonary Arteries: Pulmonary hypertension not present. The estimated PASP is 30 mmHg.  -----  LHC 04/2020  Mild single vessel coronary artery disease with preserved LV function.  Elevated LVEDP.  Recommend intense risk factor modification.  -----  Vascular duplex upper extremity venous left   Acute non-occlusive deep vein thrombosis in 1 of 2 brachial veins within the left upper extremity.   Superficial acute non-occlusive thrombosis in the cephalic and basilic veins within the left upper extremity.  ----  Nuclear stress 08/2021  Low Risk Stress Test  No significant resting or reversible defects present  Normal ejection fraction 74% without significant TID 0.88  SSS = 0, SRS = 2, SDS = -2  ----     Patient states that she gets upset and can get some chest discomfort, reports that trying to get to a psychiatrist.  Patient states that usually sharp.  Sometimes comes in the back but states that does not feel like muskuloskeletal.  There is a correlate with blood pressure elevation.  Reports that at least mild with  exertion sometimes but restricted with back brace.  reports some swelling in the left leg, easy bruiser.  Denies blood clots.  Denies PND, denies fatigue, reports some chronic shortness of breath with minimal exertion, denies orthopnea, denies palpitations     Family History   Problem Relation Age of Onset    No Known Problems Mother        Past Medical History:   Diagnosis Date    Anxiety     Bipolar 1 disorder (HCC)     CHF (congestive heart failure) (HCC)     COPD (chronic obstructive pulmonary disease) (HCC)     COPD (chronic obstructive pulmonary disease) (HCC)     H/O aortic valve replacement     PTSD (post-traumatic stress disorder)        Past Surgical History:   Procedure Laterality Date    CARDIAC CATHETERIZATION      CARDIAC VALVE SURGERY      mitral valve replacement    HAND/FINGER SURGERY UNLISTED Right     I&D right middle finger       Social History     Tobacco Use    Smoking status: Every Day     Packs/day: 1.00     Types: Cigarettes    Smokeless tobacco: Never   Substance Use Topics    Alcohol use: Never       Allergies   Allergen Reactions    Alprazolam Other (See Comments)     Gets suicidal  Diazepam Other (See Comments)     Gets suicidal    Tobacco     Adhesive Tape Rash    Lithium Nausea And Vomiting       Prior to Admission medications    Medication Sig Start Date End Date Taking? Authorizing Provider   cephALEXin (KEFLEX) 500 MG capsule Take 1 capsule by mouth 4 times daily for 10 days  Patient not taking: Reported on 08/23/2021 08/23/21 09/02/21  Norvel Richards, MD   empagliflozin (JARDIANCE) 10 MG tablet Take 1 tablet by mouth daily  Patient not taking: Reported on 08/23/2021 08/06/21   Sandre Kitty, MD   pravastatin (PRAVACHOL) 40 MG tablet Take 1 tablet by mouth daily 08/06/21   Sandre Kitty, MD   metoprolol succinate (TOPROL XL) 25 MG extended release tablet Take 1 tablet by mouth daily 08/06/21   Sandre Kitty, MD   warfarin (COUMADIN) 2.5 MG tablet Take 2 tablets by mouth daily 08/06/21    Sandre Kitty, MD   pregabalin (LYRICA) 200 MG capsule take 1 capsule by mouth three times a day 07/21/21 07/22/11  Valetta Fuller, APRN - NP   albuterol sulfate HFA (PROVENTIL;VENTOLIN;PROAIR) 108 (90 Base) MCG/ACT inhaler Inhale 2 puffs into the lungs every 6 hours as needed 11/19/19   Ar Automatic Reconciliation   aspirin 81 MG chewable tablet Take 1 tablet by mouth daily    Ar Automatic Reconciliation   cetirizine (ZYRTEC) 10 MG tablet Take 1 tablet by mouth daily 10/23/19   Ar Automatic Reconciliation   cyanocobalamin 1000 MCG tablet cyanocobalamin (vit B-12) 1,000 mcg tablet   take 1 tablet by mouth daily as directed  Patient not taking: Reported on 08/23/2021    Ar Automatic Reconciliation   docusate (COLACE, DULCOLAX) 100 MG CAPS Col-Rite 100 mg capsule   take 1 capsule by mouth twice a day if needed for constipation    Ar Automatic Reconciliation   fluticasone (FLONASE) 50 MCG/ACT nasal spray 1 spray by nasal route daily 05/09/20   Ar Automatic Reconciliation   furosemide (LASIX) 40 MG tablet Take 1 tablet by mouth daily as needed    Ar Automatic Reconciliation   hydrOXYzine HCl (ATARAX) 25 MG tablet Take 25 mg by mouth 2 times daily as needed  Patient not taking: No sig reported    Ar Automatic Reconciliation   albuterol-ipratropium (COMBIVENT RESPIMAT) 20-100 MCG/ACT AERS inhaler Inhale 1 puff into the lungs 4 times daily 05/09/20   Ar Automatic Reconciliation   lamoTRIgine (LAMICTAL) 25 MG tablet Take 75 mg by mouth daily  Patient not taking: Reported on 08/23/2021 12/09/19   Ar Automatic Reconciliation   omeprazole (PRILOSEC) 40 MG delayed release capsule Take 1 capsule by mouth daily 11/17/19   Ar Automatic Reconciliation   tiZANidine (ZANAFLEX) 2 MG tablet Take 2 mg by mouth 3 times daily as needed  Patient not taking: Reported on 07/12/2021 01/11/21   Ar Automatic Reconciliation       No results found for: LIPIDPAN, BMP, CMP     There were no vitals taken for this visit.    HPI    Review of Systems      Physical  Exam    ASSESSMENT and PLAN  Mary [name] has a reminder for a "due or due soon" health maintenance. I have asked that she contact her primary care provider for follow-up on this health maintenance.     HLD - Continue pravastatin 40mg  po daily, survey lipid panel periodically.  Atrial fibrillation - On coumadin, requests refill, Check for primary prevention of stroke.  Currently not eligible for Noac.       HTN - Normotensive today, metoprolol succinate 25mg  po daily,      Chronic heart failure preserved ejection fraction - Start jardiance 10mg  po daily, monitor fluid status, adjust as necessary, fluid restrication 2L/day, salt intake <2g per day.  Check 2D transthoracic echocardiogram evaluate for structural heart disease.  Currently on Lasix 40 mg p.o. daily as needed weight gain 2 to 3 pounds     Chest pain -  check nuclear chemical stress test intolerance to exercise evaluate for obstructive coronary artery disease.  Patient advised that if increasing frequency duration and intensity of discomfort or unrelieved discomfort to call EMS to visit emergency department.     Moderate aortic regurgitation  Mitral valve bioprosthetic with mean gradient 7 mmHg 08/02/2021

## 2021-08-24 NOTE — Progress Notes (Signed)
Chaplain conducted an initial consultation and Spiritual Assessment for Mary [name], who is a 56 y.o.,female. Patient's Primary Language is: [place].   According to the patient's EMR Religious Affiliation is: [facility].     The reason the Patient came to the hospital is:   Patient Active Problem List    Diagnosis Date Noted    Sepsis (HCC) 08/23/2021    Bacteremia 08/23/2021    Septic shock (HCC) 08/23/2021    Chronic bilateral thoracic back pain 01/11/2021    Thoracic compression fracture, sequela 01/11/2021    Mitral valve replaced 12/17/2020    Rib pain on left side 07/10/2020    Asthma 07/10/2020    Osteopenia 07/10/2020    Hyperlipidemia 07/10/2020    Diabetes mellitus (HCC) 07/10/2020    Chest congestion 07/10/2020    Elevated INR 07/10/2020    History of mitral valve replacement with bioprosthetic valve 07/10/2020    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    Anxiety disorder 07/10/2020    Bipolar affective disorder, depressed in partial remission (HCC) 07/10/2020    Heart valve pulmonary stenosis 07/10/2020    Lumbar compression fracture (HCC) 07/10/2020    Afib (HCC) 07/10/2020    Allergy 07/10/2020    Abrasion of right middle finger with infection 07/05/2020    Suppurative tenosynovitis of flexor tendon of right hand 07/05/2020    Cellulitis and abscess of hand 07/05/2020    Abscess of finger 07/05/2020    Chronic pain syndrome 06/03/2020    COVID-19 05/03/2020    Respiratory failure (HCC) 05/03/2020    Mitral regurgitation 04/23/2020    DDD (degenerative disc disease), lumbosacral 08/21/2019    TIA (transient ischemic attack) 07/24/2017    Severe obesity with body mass index (BMI) of 35.0 to 39.9 with serious comorbidity (HCC) 07/08/2017    Sensorineural hearing loss (SNHL) of both ears 07/05/2017    Bipolar I disorder with depression, severe (HCC) 02/07/2017    Severe obesity (BMI >= 40) (HCC) 12/17/2015    Chronic obstructive pulmonary disease (HCC) 08/24/2015    Moderate to severe pulmonary  hypertension (HCC) 07/10/2015    Diastolic CHF, chronic (HCC) 07/05/2015    Dyslipidemia 06/12/2015    Onycholysis 03/20/2015    Facial neuropathy, traumatic, left, 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   Explored issues of faith, belief, spirituality and religious/ritual needs while hospitalized.  Listened empathically.  Provided information about Spiritual Care Services.  Offered prayer and assurance of continued prayers on patient's behalf.   Chart reviewed.    The following outcomes where achieved:  Chaplain confirmed Patient's Religious Affiliation.  Patient expressed gratitude for chaplain's visit.    Assessment:  Patient does not have any religious/cultural needs that will affect patient's preferences in health care.  There are no spiritual or religious issues which require intervention at this time.     Plan:  Chaplains will continue to follow and will provide pastoral care on an as needed/requested basis.  Chaplain recommends bedside caregivers page chaplain on duty if patient shows signs of acute spiritual or emotional distress.    Chaplain Eilene Ghazi  Spiritual Care   [phone]

## 2021-08-25 LAB — CULTURE, BLOOD 1

## 2021-08-25 LAB — CBC WITH AUTO DIFFERENTIAL
Absolute Immature Granulocyte: 0.1 10*3/uL — ABNORMAL HIGH (ref 0.00–0.04)
Basophils %: 0 % (ref 0–2)
Basophils Absolute: 0 10*3/uL (ref 0.0–0.1)
Eosinophils %: 0 % (ref 0–5)
Eosinophils Absolute: 0 10*3/uL (ref 0.0–0.4)
Hematocrit: 31.7 % — ABNORMAL LOW (ref 35.0–45.0)
Hemoglobin: 10.5 g/dL — ABNORMAL LOW (ref 12.0–16.0)
Immature Granulocytes: 1 % — ABNORMAL HIGH (ref 0.0–0.5)
Lymphocytes %: 16 % — ABNORMAL LOW (ref 21–52)
Lymphocytes Absolute: 1.1 10*3/uL (ref 0.9–3.6)
MCH: 29.8 PG (ref 24.0–34.0)
MCHC: 33.1 g/dL (ref 31.0–37.0)
MCV: 90.1 FL (ref 78.0–100.0)
MPV: 11.5 FL (ref 9.2–11.8)
Monocytes %: 13 % — ABNORMAL HIGH (ref 3–10)
Monocytes Absolute: 0.9 10*3/uL (ref 0.05–1.2)
Neutrophils %: 70 % (ref 40–73)
Neutrophils Absolute: 4.7 10*3/uL (ref 1.8–8.0)
Nucleated RBCs: 0 PER 100 WBC
Platelets: 135 10*3/uL (ref 135–420)
RBC: 3.52 M/uL — ABNORMAL LOW (ref 4.20–5.30)
RDW: 15.5 % — ABNORMAL HIGH (ref 11.6–14.5)
WBC: 6.8 10*3/uL (ref 4.6–13.2)
nRBC: 0 10*3/uL (ref 0.00–0.01)

## 2021-08-25 LAB — CULTURE, BLOOD 2

## 2021-08-25 LAB — BASIC METABOLIC PANEL
Anion Gap: 5 mmol/L (ref 3.0–18)
BUN: 19 MG/DL — ABNORMAL HIGH (ref 7.0–18)
Bun/Cre Ratio: 18 (ref 12–20)
CO2: 19 mmol/L — ABNORMAL LOW (ref 21–32)
Calcium: 8.1 MG/DL — ABNORMAL LOW (ref 8.5–10.1)
Chloride: 117 mmol/L — ABNORMAL HIGH (ref 100–111)
Creatinine: 1.04 MG/DL (ref 0.6–1.3)
Est, Glom Filt Rate: >60 mL/min/{1.73_m2} (ref >60)
Glucose: 132 mg/dL — ABNORMAL HIGH (ref 74–99)
Potassium: 3.9 mmol/L (ref 3.5–5.5)
Sodium: 141 mmol/L (ref 136–145)

## 2021-08-25 LAB — POCT GLUCOSE
POC Glucose: 155 mg/dL — ABNORMAL HIGH (ref 70–110)
POC Glucose: 148 mg/dL — ABNORMAL HIGH (ref 70–110)
POC Glucose: 97 mg/dL (ref 70–110)
POC Glucose: 86 mg/dL (ref 70–110)

## 2021-08-25 LAB — PROTIME-INR
INR: 3.7 — ABNORMAL HIGH (ref 0.8–1.2)
Protime: 37.2 s — ABNORMAL HIGH (ref 11.5–15.2)

## 2021-08-25 LAB — MAGNESIUM: Magnesium: 3.2 mg/dL — ABNORMAL HIGH (ref 1.6–2.6)

## 2021-08-25 LAB — PHOSPHORUS: Phosphorus: 2.1 MG/DL — ABNORMAL LOW (ref 2.5–4.9)

## 2021-08-25 MED ORDER — CETIRIZINE HCL 10 MG PO TABS
10 MG | Freq: Every evening | ORAL | Status: AC
Start: 2021-08-25 — End: 2021-08-28
  Administered 2021-08-25 – 2021-08-28 (×4): 10 mg via ORAL

## 2021-08-25 MED ORDER — ASPIRIN 81 MG PO CHEW
81 MG | Freq: Every day | ORAL | Status: AC
Start: 2021-08-25 — End: 2021-08-28
  Administered 2021-08-26 – 2021-08-28 (×3): 81 mg via ORAL

## 2021-08-25 MED ORDER — IPRATROPIUM-ALBUTEROL 0.5-2.5 (3) MG/3ML IN SOLN
0.5-2.533 (3) MG/3ML | Freq: Four times a day (QID) | RESPIRATORY_TRACT | Status: AC
Start: 2021-08-25 — End: 2021-08-27
  Administered 2021-08-25 – 2021-08-27 (×6): 1 via RESPIRATORY_TRACT

## 2021-08-25 MED ORDER — IPRATROPIUM-ALBUTEROL 0.5-2.5 (3) MG/3ML IN SOLN
RESPIRATORY_TRACT | Status: DC | PRN
Start: 2021-08-25 — End: 2021-08-25

## 2021-08-25 MED ORDER — METOPROLOL SUCCINATE ER 25 MG PO TB24
25 MG | Freq: Every day | ORAL | Status: AC
Start: 2021-08-25 — End: 2021-08-28
  Administered 2021-08-25 – 2021-08-28 (×4): 25 mg via ORAL

## 2021-08-25 MED ORDER — PREGABALIN 75 MG PO CAPS
75 MG | Freq: Three times a day (TID) | ORAL | Status: AC
Start: 2021-08-25 — End: 2021-08-28
  Administered 2021-08-25 – 2021-08-28 (×9): 200 mg via ORAL

## 2021-08-25 MED ORDER — PRAVASTATIN SODIUM 20 MG PO TABS
20 MG | Freq: Every day | ORAL | Status: AC
Start: 2021-08-25 — End: 2021-08-28
  Administered 2021-08-25 – 2021-08-28 (×4): 40 mg via ORAL

## 2021-08-25 MED ORDER — FLUTICASONE PROPIONATE 50 MCG/ACT NA SUSP
50 MCG/ACT | Freq: Every day | NASAL | Status: AC
Start: 2021-08-25 — End: 2021-08-28
  Administered 2021-08-25 – 2021-08-27 (×3): 1 via NASAL

## 2021-08-25 MED ORDER — IPRATROPIUM-ALBUTEROL 0.5-2.5 (3) MG/3ML IN SOLN
Freq: Four times a day (QID) | RESPIRATORY_TRACT | Status: DC
Start: 2021-08-25 — End: 2021-08-25

## 2021-08-25 MED ORDER — HYDROXYZINE HCL 25 MG PO TABS
25 MG | Freq: Three times a day (TID) | ORAL | Status: AC | PRN
Start: 2021-08-25 — End: 2021-08-28
  Administered 2021-08-25 – 2021-08-28 (×4): 25 mg via ORAL

## 2021-08-25 MED ORDER — WARFARIN DAILY DOSING BY PHARMACIST (PLACEHOLDER)
Status: AC
Start: 2021-08-25 — End: 2021-08-28

## 2021-08-25 MED ORDER — K PHOS MONO-SOD PHOS DI & MONO 155-852-130 MG PO TABS
155-852-130 MG | Freq: Two times a day (BID) | ORAL | Status: AC
Start: 2021-08-25 — End: 2021-08-27
  Administered 2021-08-25 – 2021-08-27 (×4): 1 mg via ORAL

## 2021-08-25 MED ORDER — ALBUTEROL SULFATE (2.5 MG/3ML) 0.083% IN NEBU
RESPIRATORY_TRACT | Status: AC | PRN
Start: 2021-08-25 — End: 2021-08-28

## 2021-08-25 MED ORDER — ALUMINUM & MAGNESIUM HYDROXIDE 200-200 MG/5ML PO SUSP
200-2005 MG/5ML | Freq: Four times a day (QID) | ORAL | Status: AC | PRN
Start: 2021-08-25 — End: 2021-08-28
  Administered 2021-08-25: 21:00:00 30 mL via ORAL

## 2021-08-25 MED FILL — PREGABALIN 50 MG PO CAPS: 50 MG | ORAL | Qty: 1

## 2021-08-25 MED FILL — CETIRIZINE HCL 10 MG PO TABS: 10 MG | ORAL | Qty: 1

## 2021-08-25 MED FILL — K-PHOS-NEUTRAL 155-852-130 MG PO TABS: 155-852-130 MG | ORAL | Qty: 1

## 2021-08-25 MED FILL — METOPROLOL SUCCINATE ER 25 MG PO TB24: 25 MG | ORAL | Qty: 1

## 2021-08-25 MED FILL — ACETAMINOPHEN 325 MG PO TABS: 325 MG | ORAL | Qty: 2

## 2021-08-25 MED FILL — HYDROXYZINE HCL 25 MG PO TABS: 25 MG | ORAL | Qty: 1

## 2021-08-25 MED FILL — LAMOTRIGINE 25 MG PO TABS: 25 MG | ORAL | Qty: 1

## 2021-08-25 MED FILL — IPRATROPIUM-ALBUTEROL 0.5-2.5 (3) MG/3ML IN SOLN: RESPIRATORY_TRACT | Qty: 3

## 2021-08-25 MED FILL — FLUTICASONE PROPIONATE 50 MCG/ACT NA SUSP: 50 MCG/ACT | NASAL | Qty: 16

## 2021-08-25 MED FILL — LYRICA 25 MG PO CAPS: 25 MG | ORAL | Qty: 2

## 2021-08-25 MED FILL — CITALOPRAM HYDROBROMIDE 20 MG PO TABS: 20 MG | ORAL | Qty: 1

## 2021-08-25 MED FILL — MAG-AL 200-200 MG/5ML PO LIQD: 200-200 MG/5ML | ORAL | Qty: 30

## 2021-08-25 MED FILL — PRAVASTATIN SODIUM 20 MG PO TABS: 20 MG | ORAL | Qty: 2

## 2021-08-25 MED FILL — PANTOPRAZOLE SODIUM 40 MG PO TBEC: 40 MG | ORAL | Qty: 1

## 2021-08-25 MED FILL — NICOTINE 14 MG/24HR TD PT24: 14 MG/24HR | TRANSDERMAL | Qty: 1

## 2021-08-25 MED FILL — ASPIRIN LOW DOSE 81 MG PO CHEW: 81 MG | ORAL | Qty: 1

## 2021-08-25 MED FILL — CEFTRIAXONE SODIUM 2 G IJ SOLR: 2 g | INTRAMUSCULAR | Qty: 2000

## 2021-08-25 NOTE — Progress Notes (Signed)
[facility]  (A Division of [facility])      Progress Note      Date of Admission: 08/23/2021    Date of Note: 08/25/2021      Reason for Referral: GNR sepsis  Referring Physician: Thamas JaegersNicole Grimm, PA-C    Micro from this admission:   4/9 respiratory viral panel: negative  4/9 blood culture: 4 out of 4 gram-negative Pan-sensitive E.coli  4/9 urine cx: >100K GNR  4/10 blood cultures:NGTD x2  4/10 blood cx: ngtd x 2  4/10 rapid influenza a and B PCR: Negative    Current Antimicrobials:    Prior Antimicrobials:  Ceftriaxone 4/9 present        Assessment:         E.coli  sepsis suspect GU origin: 4/9 UA with too numerous to count WBCs, 2+ bacteria, moderate leukocyte esterase, large blood; repeat UA on 6/10 with 6-8 WBCs 3+ bacteria  Suspected pyelonephritis on the right : No evidence of obstruction or hydronephrosis noted on CT scan  Urinary tract infection- CT scan suggestive of early pyelonephritis ; no obstructive uropathy  Right lower lobe pulmonary nodule 3 mm  Bipolar disorder type I with anxiety  COPD  CHF  History of aortic valve replacement    Plan:   Continue with ceftriaxone forE.coli sepsis.   - can transition to po levofloxacin 750 mg po daily for sepsis + UTI + pyelonephritis  -  stop date of 4/24  Follow-up 4/10 blood cultures- negative thus far  Trend CBC, BMP, procalcitonin      Helyn Numbersonice Abdel Effinger, DO  [facility]  [address], [address], [place], [place] [id number]  Office: [phone], Ext [phone]      Lines / Catheters:  peripheral      Subjective:   Patient seen and examined.  Case Manager in room at time of my visit this am.   No new fevers documented. Overall she is feeling better. Still intermittently SOB and wheezy. Worried about her medicaid phone.       HPI:  Mary [name] is a 56 year old female with a history of CHF, COPD, anxiety with bipolar 1 disorder who presented to the emergency department yesterday evening secondary  to fevers chills and generalized malaise.  She been having fevers since Friday and had a Tmax of 101.7.  She had called EMS which brought her to the emergency department where she was noted to have hypotension with BPs of 85/55.  At that time she was diagnosed with a UTI given IV fluids and was discharged home on Keflex.  She was called back to the ER earlier this morning secondary to blood cultures being noted to be positive in 4 out of 4 bottles from her recent ED visit.  She continues to have ongoing fevers tachycardia and chills.  She has some right-sided flank pain.  She had been given a dose of ceftriaxone yesterday and is doing for a second dose of ceftriaxone this evening.  Overall she feels slightly better than she had yesterday although not too much.  No associated nausea vomiting or diarrhea.  Poor appetite           Objective:      BP (!) 150/80   Pulse 73   Temp 98.6 F (37 C) (Oral)   Resp 20   Ht 5\' 3"  (1.6 m)   Wt 194 lb (88 kg)   SpO2 95%   BMI 34.37 kg/m   Temp (  24hrs), Avg:98 F (36.7 C), Min:97.5 F (36.4 C), Max:98.6 F (37 C)        General:   awake alert and oriented   Skin:   no rashes or skin lesions noted on limited exam   HEENT:  Normocephalic, atraumatic, PERRL, EOMI, no scleral icterus or pallor; no conjunctival hemmohage;    Lymph Nodes:   no cervical or inguinal adenopathy   Lungs:   non-labored, occasional wheezing bilaterally, diminished at bases   Heart:  RRR, s1 and s2; no murmurs rubs or gallops, no edema, + pedal pulses   Abdomen:  soft, non-distended, active bowel sounds. Non-tender   Genitourinary:  deferred   Extremities:   no clubbing, cyanosis; no joint effusions or swelling; Full ROM of all large joints to the upper and lower extremities; muscle mass appropriate for age   Neurologic:  No gross focal sensory abnormalities; equal muscle strength to upper and lower extremities. Speech appropirate. Cranial nerves intact   Psychiatric:  Anxious, pressured speech        Labs: Results:   Chemistry Recent Labs     08/23/21  1040 08/23/21  1714 08/23/21  2230 08/24/21  0530 08/25/21  0110   NA 138   < > 141 144 141   K 3.2*   < > 3.9 3.4* 3.9   CL 110   < > 118* 119* 117*   CO2 19*   < > 20* 19* 19*   BUN 21*   < > 22* 19* 19*   GLOB 2.9  --  3.2 2.5  --     < > = values in this interval not displayed.      CBC w/Diff Recent Labs     08/23/21  2230 08/24/21  1005 08/25/21  0110   WBC 6.2 3.6* 6.8   RBC 3.54* 3.54* 3.52*   HGB 10.7* 10.6* 10.5*   HCT 31.7* 32.0* 31.7*   PLT 120* 115* 135            No results found for: SDES No components found for: CULT     Results       Procedure Component Value Units Date/Time    Culture, MRSA, Screening [[id number]]     Order Status: Sent Specimen: Nares     Culture, Blood 1 [[id number]] Collected: 08/23/21 2230    Order Status: Completed Specimen: Blood Updated: 08/25/21 0701     Special Requests NO SPECIAL REQUESTS        Culture NO GROWTH 2 DAYS       Blood Culture 2 [[id number]] Collected: 08/23/21 2220    Order Status: Completed Specimen: Blood Updated: 08/25/21 0701     Special Requests NO SPECIAL REQUESTS        Culture NO GROWTH 2 DAYS       COVID-19 & Influenza Combo [[id number]] Collected: 08/23/21 2200    Order Status: Completed Specimen: Nasopharyngeal Updated: 08/23/21 2323     SARS-CoV-2, PCR Not detected        Comment: Not Detected results do not preclude SARS-CoV-2 infection and should not be used as the sole basis for patient management decisions.Results must be combined with clinical observations, patient history, and epidemiological information.        Rapid Influenza A By PCR Not detected        Rapid Influenza B By PCR Not detected        Comment: Testing was performed using cobas Liat SARS-CoV-2 and Influenza A/B nucleic acid assay.  This test is a multiplex Real-Time Reverse Transcriptase Polymerase Chain Reaction (RT-PCR) based in vitro diagnostic test intended for the qualitative detection of nucleic acids from  SARS-CoV-2, Influenza A, and Influenza B in nasopharyngeal for use under the FDA's Emergency Use Authorization(EAU) only.       Fact sheet for Patients: SalonClasses.at  Fact sheet for Healthcare Providers: https://www.fda.fov/media/142191/download         Culture, Blood 2 [[id number]] Collected: 08/23/21 1059    Order Status: Completed Specimen: Blood Updated: 08/25/21 0701     Special Requests NO SPECIAL REQUESTS        Culture NO GROWTH 2 DAYS       COVID-19 & Influenza Combo [[id number]] Collected: 08/23/21 1054    Order Status: Completed Specimen: Nasopharyngeal Updated: 08/23/21 1130     SARS-CoV-2, PCR Not detected        Comment: Not Detected results do not preclude SARS-CoV-2 infection and should not be used as the sole basis for patient management decisions.Results must be combined with clinical observations, patient history, and epidemiological information.        Rapid Influenza A By PCR Not detected        Rapid Influenza B By PCR Not detected        Comment: Testing was performed using cobas Liat SARS-CoV-2 and Influenza A/B nucleic acid assay.  This test is a multiplex Real-Time Reverse Transcriptase Polymerase Chain Reaction (RT-PCR) based in vitro diagnostic test intended for the qualitative detection of nucleic acids from SARS-CoV-2, Influenza A, and Influenza B in nasopharyngeal for use under the FDA's Emergency Use Authorization(EAU) only.       Fact sheet for Patients: SalonClasses.at  Fact sheet for Healthcare Providers: https://www.fda.fov/media/142191/download         Culture, Blood 1 [[id number]] Collected: 08/23/21 1039    Order Status: Completed Specimen: Blood Updated: 08/25/21 0701     Special Requests NO SPECIAL REQUESTS        Culture NO GROWTH 2 DAYS       Culture, Urine [[id number]]  (Abnormal) Collected: 08/22/21 2005    Order Status: Completed Specimen: Urine, clean catch Updated: 08/24/21 1342     Special Requests NO SPECIAL  REQUESTS        Colony count --        >100,000  COLONIES/mL       Culture Gram negative rods       Blood Culture 2 [[id number]]  (Abnormal) Collected: 08/22/21 1959    Order Status: Completed Specimen: Blood Updated: 08/25/21 0903     Special Requests NO SPECIAL REQUESTS        Gram stain       AEROBIC AND ANAEROBIC BOTTLES Gram negative rods                  SMEAR CALLED TO AND CORRECTLY REPEATED BY: PA D POOLE. ED. 0820. 08/23/21 BY [id number]           Culture       Escherichia coli GROWING IN BOTH BOTTLES DRAWN No Site Indicated REFER TO [id number] FOR SENSITIVITIES          Culture, Blood, PCR ID Panel [[id number]] Collected: 08/22/21 1954    Order Status: Canceled     Culture, Blood 1 [[id number]]  (Abnormal)  (Susceptibility) Collected: 08/22/21 1953    Order Status: Completed Specimen: Blood Updated: 08/25/21 0902     Special Requests NO SPECIAL REQUESTS        Gram stain  AEROBIC AND ANAEROBIC BOTTLES Gram negative rods                  SMEAR CALLED TO AND CORRECTLY REPEATED BY: PA D POOLE, ED, 0820, 08/23/21 BY [id number]           Culture       Escherichia coli GROWING IN BOTH BOTTLES DRAWN SITE=RAC          Susceptibility        Escherichia coli      BACTERIAL SUSCEPTIBILITY PANEL MIC      amikacin <=2 ug/mL Sensitive      ampicillin 4 ug/mL Sensitive      ampicillin-sulbactam <=2 ug/mL Sensitive      ceFAZolin <=4 ug/mL Sensitive      cefepime <=1 ug/mL Sensitive      cefOXitin <=4 ug/mL Sensitive      cefTAZidime <=1 ug/mL Sensitive      cefTRIAXone <=1 ug/mL Sensitive      ciprofloxacin <=0.25 ug/mL Sensitive      gentamicin <=1 ug/mL Sensitive      levofloxacin <=0.12 ug/mL Sensitive      meropenem <=0.25 ug/mL Sensitive      piperacillin-tazobactam <=4 ug/mL Sensitive      tobramycin <=1 ug/mL Sensitive      trimethoprim-sulfamethoxazole <=20 ug/mL Sensitive                           Culture, Blood, PCR ID Panel [[id number]]  (Abnormal) Collected: 08/22/21 1953    Order Status: Completed Specimen: Blood  Updated: 08/23/21 1533     Accession Number [id number]     Enterococcus faecalis by PCR Not detected        Enterococcus faecium by PCR Not detected        Listeria monocytogenes by PCR Not detected        STAPHYLOCOCCUS Not detected        Staphylococcus Aureus Not detected        Staphylococcus epidermidis by PCR Not detected        Staphylococcus lugdunensis by PCR Not detected        STREPTOCOCCUS Not detected        Streptococcus agalactiae (Group B) Not detected        Strep pneumoniae Not detected        Strep pyogenes,(Grp. A) Not detected        Acinetobacter calcoac baumannii complex by PCR Not detected        Bacteroides fragilis by PCR Not detected        Enterobacteriaceae by PCR Detected        Enterobacter cloacae complex by PCR Not detected        Escherichia Coli Detected        Klebsiella aerogenes by PCR Not detected        Klebsiella oxytoca by PCR Not detected        Klebsiella pneumoniae group by PCR Not detected        Proteus by PCR Not detected        Salmonella species by PCR Not detected        Serratia marcescens by PCR Not detected        Haemophilus Influenzae by PCR Not detected        Neisseria meningitidis by PCR Not detected        Pseudomonas aeruginosa Not detected        Stenotrophomonas maltophilia by  PCR Not detected        Candida albicans by PCR Not detected        Candida auris by PCR Not detected        Candida glabrata Not detected        Candida krusei by PCR Not detected        Candida parapsilosis by PCR Not detected        Candida tropicalis by PCR Not detected        Cryptococcus neoformans/gattii by PCR Not detected        Resistant gene targets            Resistant gene ctx-m by PCR Not detected        Resistant gene imp by PCR Not detected        KPC (Carbapenem resistance gene) Not detected        Colistin Resistance mcr-1 gene by PCR Not detected        Resistant gene ndm by PCR Not detected        Resistant gene oxa-48-like by pcr Not detected        Resistant  gene vim by PCR Not detected        [employer] test comment       False positive results may rarely occur. Correlate with clinical,epidemiologic, and other laboratory findings           Comment: Please see BCID Interpretation Guide in EPIC Links       Respiratory Panel, Molecular, with COVID-19 (Restricted: peds pts or suitable admitted adults) [[id number]] Collected: 08/22/21 1940    Order Status: Completed Specimen: Nasopharyngeal Updated: 08/22/21 2118     Adenovirus by PCR Not detected        Coronavirus 229E by PCR Not detected        Coronavirus HKU1 by PCR Not detected        Coronavirus NL63 by PCR Not detected        Coronavirus OC43 by PCR Not detected        SARS-CoV-2, PCR Not detected        Human Metapneumovirus by PCR Not detected        Rhinovirus Enterovirus PCR Not detected        Influenza A by PCR Not detected        Influenza B PCR Not detected        Parainfluenza 1 PCR Not detected        Parainfluenza 2 PCR Not detected        Parainfluenza 3 PCR Not detected        Parainfluenza 4 PCR Not detected        Respiratory Syncytial Virus by PCR Not detected        Bordetella parapertussis by PCR Not detected        Bordetella pertussis by PCR Not detected        Chlamydophila Pneumonia PCR Not detected        Mycoplasma pneumo by PCR Not detected                     Imaging:     All available imaging since presentation reviewed as per EPIC

## 2021-08-25 NOTE — Progress Notes (Signed)
[facility] Dosing Services    Consult for Warfarin Dosing by Pharmacy by Dr. Sebastian Ache  Consult provided for this 56 y.o.  female , for indication of Heart Valve Replacement  Day of Therapy: ongoing  Home dose: 5mg  daily  Dose to achieve an INR goal of 2.0 to 3.0    No warfarin to be given today - INR 3.7    Significant drug interactions: None  Previous dose given    PT/INR Lab Results   Component Value Date/Time    INR 3.7 08/25/2021 01:10 AM    INR 2.2 04/07/2020 09:20 AM    INREXT 2.0 05/27/2021 12:00 AM       Platelets Lab Results   Component Value Date/Time    PLT 135 08/25/2021 01:10 AM      H/H Lab Results   Component Value Date/Time    HGB 10.5 08/25/2021 01:10 AM        Pharmacy to follow daily and will provide subsequent Warfarin dosing based on clinical status.     Signed 10/25/2021, [facility]

## 2021-08-25 NOTE — H&P (Signed)
History & Physical    Patient: Mary [name] MRN: [id number]  CSN: [id number]    Date of Birth: 11/25/1965  Age: 56 y.o.  Sex: female      DOA: 08/23/2021    Chief Complaint:   Chief Complaint   Patient presents with    Blood Infection          HPI:     Mary [name] is a 56 y.o. Caucasian female with PMHx of anxiety, bipolar 1 disorder, HFpEF, COPD, Aortic valve replacement, and PTSD.     Patient presented to ED on 08/22/21 with fever, SOB, and cloudy urine. Patient states she started feeling poorly on Friday afternoon, by Saturday she started having a fever, chills, and noticed cloudy, malodorous urine with dysuria. Sunday she started feeling weak, dizzy and SOB which is what prompted her to call EMS. She denies nausea, vomiting, diarrhea at that time.     ED Course  Patient was seen on 08/22/2021 with WBC of 12.0.  Lactic acid 2.0 . UA with 2+ bacteria, moderate leukocyte esterase, too numerous to count WBC.  Patient was given IV ceftriaxone while in the ED and discharged on Keflex. The ED found 4/4 positive blood cultures for E coli and asked to return to the ED for treatment. Seen by Dr. Almyra Free ID who recommended IV Ceftriaxone and await sensitivity from blood cultures. Patient was having hypotension after given ativan dose for anxiety, also  tachycardia, fevers and eventually required pressors. She was admitted to ICU for E coli septicemia, pyelonephritis, and UTI.      08/23/21 CT Abd Pelvis:  1. Mildly edematous right kidney, with perirenal stranding. Trace left perirenal   stranding. Bilateral renal pelvis wall thickening and enhancement. Findings are   concerning for upper urinary tract infection/potentially very early   pyelonephritis (no striated renal enhancement at this time).   -Probable minimally complex cyst right kidney       2. Gallbladder is mildly distended, but no wall thickening or pericholecystic   fluid, and no stones visible by CT. Nonspecific.       3. Mild colonic diverticulosis.       4.  Additional chronic findings as above.         08/23/21 Chest X-ray:  No significant change. Similar degree of vascular congestion.      Past Medical History:   Diagnosis Date    Anxiety     Bipolar 1 disorder (HCC)     CHF (congestive heart failure) (HCC)     COPD (chronic obstructive pulmonary disease) (HCC)     COPD (chronic obstructive pulmonary disease) (HCC)     H/O aortic valve replacement     PTSD (post-traumatic stress disorder)        Past Surgical History:   Procedure Laterality Date    CARDIAC CATHETERIZATION      CARDIAC VALVE SURGERY      mitral valve replacement    HAND/FINGER SURGERY UNLISTED Right     I&D right middle finger       Family History   Problem Relation Age of Onset    No Known Problems Mother        Social History     Socioeconomic History    Marital status: Divorced     Spouse name: None    Number of children: None    Years of education: None    Highest education level: None   Tobacco Use    Smoking status: Every Day  Packs/day: 1.00     Types: Cigarettes    Smokeless tobacco: Never   Vaping Use    Vaping Use: Never used   Substance and Sexual Activity    Alcohol use: Never    Drug use: Yes     Types: Marijuana Sheran Fava(Weed)       Prior to Admission medications    Medication Sig Start Date End Date Taking? Authorizing Provider   pravastatin (PRAVACHOL) 40 MG tablet Take 1 tablet by mouth daily 08/06/21   Sandre Kittyhomas P Vacek, MD   metoprolol succinate (TOPROL XL) 25 MG extended release tablet Take 1 tablet by mouth daily 08/06/21   Sandre Kittyhomas P Vacek, MD   warfarin (COUMADIN) 2.5 MG tablet Take 2 tablets by mouth daily 08/06/21   Sandre Kittyhomas P Vacek, MD   pregabalin (LYRICA) 200 MG capsule take 1 capsule by mouth three times a day 07/21/21 07/22/11  Valetta FullerLora C Blount, APRN - NP   albuterol sulfate HFA (PROVENTIL;VENTOLIN;PROAIR) 108 (90 Base) MCG/ACT inhaler Inhale 2 puffs into the lungs every 6 hours as needed 11/19/19   Ar Automatic Reconciliation   aspirin 81 MG chewable tablet Take 1 tablet by mouth daily    Ar  Automatic Reconciliation   cetirizine (ZYRTEC) 10 MG tablet Take 1 tablet by mouth daily 10/23/19   Ar Automatic Reconciliation   docusate (COLACE, DULCOLAX) 100 MG CAPS Col-Rite 100 mg capsule   take 1 capsule by mouth twice a day if needed for constipation    Ar Automatic Reconciliation   fluticasone (FLONASE) 50 MCG/ACT nasal spray 1 spray by nasal route daily 05/09/20   Ar Automatic Reconciliation   furosemide (LASIX) 40 MG tablet Take 1 tablet by mouth daily as needed    Ar Automatic Reconciliation   albuterol-ipratropium (COMBIVENT RESPIMAT) 20-100 MCG/ACT AERS inhaler Inhale 1 puff into the lungs 4 times daily 05/09/20   Ar Automatic Reconciliation   lamoTRIgine (LAMICTAL) 25 MG tablet Take 75 mg by mouth daily  Patient not taking: Reported on 08/23/2021 12/09/19   Ar Automatic Reconciliation   omeprazole (PRILOSEC) 40 MG delayed release capsule Take 1 capsule by mouth daily 11/17/19   Ar Automatic Reconciliation       Allergies   Allergen Reactions    Alprazolam Other (See Comments)     Gets suicidal    Diazepam Other (See Comments)     Gets suicidal    Tobacco     Adhesive Tape Rash    Lithium Nausea And Vomiting       Review of Systems  GENERAL: Patient alert, awake and oriented times 3, able to communicate full sentences and not in distress.   CARDIOVASCULAR: No chest pain or pressure. No palpitations.   PULMONARY: SOB off and on hx of COPD.   GASTROINTESTINAL: No abdominal pain, nausea, vomiting or diarrhea, melena or  bright red blood per rectum.   GENITOURINARY:  Urinary frequency, dysuria.   MUSCULOSKELETAL: Back Pain, no recent trauma.   DERMATOLOGIC: No rash, no itching, no lesions.   NEUROLOGIC: No headache, seizures, numbness  PSYCHIATRIC: Anxiety, mood disorder      Physical Exam:     Physical Exam:  BP 132/77   Pulse 70   Temp 98.8 F (37.1 C) (Oral)   Resp 20   Ht 5\' 3"  (1.6 m)   Wt 194 lb (88 kg)   SpO2 97%   BMI 34.37 kg/m         Temp (24hrs), Avg:98.1 F (36.7 C), Min:97.5 F (36.4  C),  Max:98.8 F (37.1 C)    No intake/output data recorded.   04/10 1901 - 04/12 0700  In: 3591.5 [P.O.:550; I.V.:1041.5]  Out: 150 [Urine:150]    General:  Alert, cooperative, no distress, appears stated age.   Head:  Normocephalic, without obvious abnormality, atraumatic.   Eyes:  Conjunctivae/corneas clear. PERRL, EOMs intact.   Throat: Lips, mucosa, and tongue normal.    Neck: Supple, symmetrical, trachea midline, no adenopathy,    Back:   No CVA tenderness.   Lungs:   Clear to auscultation bilaterally.   Chest wall:  No tenderness or deformity.   Heart:  Regular rate and rhythm, no appreciable murmur      Abdomen: Soft, non-tender. Bowel sounds normal. No masses,  No organomegaly.   Extremities: Extremities normal, atraumatic, no cyanosis or edema.   Pulses: 2+ and symmetric all extremities.   Neurologic: CNII-XII grossly intact. No focal motor or sensory deficit.       EKG  Normal sinus rhythm   Nonspecific ST and T wave abnormality          Assessment/Plan     Principal Problem:    Sepsis (HCC)  Active Problems:    Bacteremia    Septic shock (HCC)  Resolved Problems:    * No resolved hospital problems. *     Plan  Sepsis/Pyelonephritis/UTI  4/9 Patient found to have 4/4 positive blood cultures for E coli sensitive to Ceftriaxone, PCR detected enterobacteria  ID following Dr. Almyra Free  4/10 Repeat blood cultures no growth at 2 days  Will continue Ceftriaxone x 7 days  Lactic acid 1.1  Procalcitonin 3.70  4/10 rapid influenza a and B PCR: Negative  PT/OT evaluation  Will get CM consult for medication assistance  Phos 2.1 will replete    COPD  Duoneb q6h PRN  Instructed patient to not use home inhaler while inpatient, she can ask for nebulizer as need for symptoms    Bipolar/Anxiety/PTSD  Citalopram 20 mg daily  Lamotrigine 25 mg q8h  Hydroxyzine prn      HFrEF  Furosemide 40 mg PRN home medication  Echo 08/20/21, shows diastolic dysfunction, EF 55-60%    Left Ventricle: Normal left ventricular systolic function with a  visually estimated EF of 55 - 60%. Left ventricle is mildly dilated. Increased wall thickness. See diagram for wall motion findings. Diastolic function present with increased LAP with normal LV EF.    Right Ventricle: Right ventricle is mildly dilated. RVIDd is 4.3 cm.    Aortic Valve: Moderate regurgitation with a centrally directed jet. Vena contracta measures 0.5 cm.    Mitral Valve: Bioprosthetic valve that is well-seated with normal leaflet motion. MV mean gradient is 7 mmHg. Moderate regurgitation.    Left Atrium: Left atrium is mildly dilated. Left atrial volume index is mildly increased (35-41 mL/m2). LA Vol Index A/L is 38 mL/m2.    Pulmonary Arteries: Pulmonary hypertension not present. The estimated PASP is 30 mmHg.  Continue coumadin for aortic valve replacement, pharmacy consult for dosing, goal INR per cardiology has been 2.0-2.5.      Nicotine Dependence  Nicotine patch daily  Right lower lobe pulmonary nodule on CT scan 01/21/21 - follow up outpatient      DVT/GI Prophylaxis: warfarin, Protonix      Barnie Del  08/25/2021      Patient seen and examined independently.  Reviewed PA student note and changes made where appropriate.  Agree with assessment and plan    Signed  By: Rowan Blase, MD     August 25, 2021

## 2021-08-25 NOTE — Plan of Care (Signed)
Problem: Safety - Adult  Goal: Free from fall injury  Outcome: Progressing     Problem: Discharge Planning  Goal: Discharge to home or other facility with appropriate resources  Outcome: Progressing     Problem: Chronic Conditions and Co-morbidities  Goal: Patient's chronic conditions and co-morbidity symptoms are monitored and maintained or improved  Outcome: Progressing     Problem: ABCDS Injury Assessment  Goal: Absence of physical injury  Outcome: Progressing

## 2021-08-25 NOTE — Progress Notes (Signed)
56 yo  Female full code      Arrived 08/22/21  EMS sob fever cloudy urine lower back pain she received a call about positive blood cultures, diagnosed with UTI hypotensive, tachycardia chills and fever with right side flank pain  On 08/23/21 left AMA  08/23/21 returned to ER two hours later feeling anxious    Diagnosed: pylonephritis sepsis shock put on levophed  History anxiety bipolar 1 CHF copd PTSD     20 g R AC  Picc line R Basilic

## 2021-08-25 NOTE — Progress Notes (Signed)
error 

## 2021-08-25 NOTE — Progress Notes (Signed)
Patient called nurses station stating she could not breath. She had returned to the chair after bathing in the restroom. Arriving in the room, patient appeared anxious and a bit tearful.  Patient sated she did take 2 puffs of her rescue inhaler at bedside. 2LPM NC was put on and O2 sats at 96%. Patient is calm, resting comfortably in her recliner. No other needs noted at this time. Will continue to monitor.

## 2021-08-26 LAB — PROTIME-INR
INR: 2.2 — ABNORMAL HIGH (ref 0.8–1.2)
Protime: 25.1 s — ABNORMAL HIGH (ref 11.5–15.2)

## 2021-08-26 LAB — PHOSPHORUS: Phosphorus: 2.4 MG/DL — ABNORMAL LOW (ref 2.5–4.9)

## 2021-08-26 LAB — CBC WITH AUTO DIFFERENTIAL
Absolute Immature Granulocyte: 0.1 10*3/uL — ABNORMAL HIGH (ref 0.00–0.04)
Basophils %: 1 % (ref 0–2)
Basophils Absolute: 0 10*3/uL (ref 0.0–0.1)
Eosinophils %: 1 % (ref 0–5)
Eosinophils Absolute: 0.1 10*3/uL (ref 0.0–0.4)
Hematocrit: 31.8 % — ABNORMAL LOW (ref 35.0–45.0)
Hemoglobin: 10.5 g/dL — ABNORMAL LOW (ref 12.0–16.0)
Immature Granulocytes: 1 % — ABNORMAL HIGH (ref 0.0–0.5)
Lymphocytes %: 21 % (ref 21–52)
Lymphocytes Absolute: 1.3 10*3/uL (ref 0.9–3.6)
MCH: 30.2 PG (ref 24.0–34.0)
MCHC: 33 g/dL (ref 31.0–37.0)
MCV: 91.4 FL (ref 78.0–100.0)
MPV: 11.2 FL (ref 9.2–11.8)
Monocytes %: 16 % — ABNORMAL HIGH (ref 3–10)
Monocytes Absolute: 1 10*3/uL (ref 0.05–1.2)
Neutrophils %: 60 % (ref 40–73)
Neutrophils Absolute: 3.8 10*3/uL (ref 1.8–8.0)
Nucleated RBCs: 0 PER 100 WBC
Platelets: 174 10*3/uL (ref 135–420)
RBC: 3.48 M/uL — ABNORMAL LOW (ref 4.20–5.30)
RDW: 15.6 % — ABNORMAL HIGH (ref 11.6–14.5)
WBC: 6.4 10*3/uL (ref 4.6–13.2)
nRBC: 0 10*3/uL (ref 0.00–0.01)

## 2021-08-26 LAB — COMPREHENSIVE METABOLIC PANEL
ALT: 24 U/L (ref 13–56)
AST: 20 U/L (ref 10–38)
Albumin/Globulin Ratio: 0.7 — ABNORMAL LOW (ref 0.8–1.7)
Albumin: 2.2 g/dL — ABNORMAL LOW (ref 3.4–5.0)
Alk Phosphatase: 72 U/L (ref 45–117)
Anion Gap: 7 mmol/L (ref 3.0–18)
BUN: 11 MG/DL (ref 7.0–18)
Bun/Cre Ratio: 11 — ABNORMAL LOW (ref 12–20)
CO2: 23 mmol/L (ref 21–32)
Calcium: 7.6 MG/DL — ABNORMAL LOW (ref 8.5–10.1)
Chloride: 111 mmol/L (ref 100–111)
Creatinine: 1 MG/DL (ref 0.6–1.3)
Est, Glom Filt Rate: >60 mL/min/{1.73_m2} (ref >60)
Globulin: 3.3 g/dL (ref 2.0–4.0)
Glucose: 98 mg/dL (ref 74–99)
Potassium: 3.4 mmol/L — ABNORMAL LOW (ref 3.5–5.5)
Sodium: 141 mmol/L (ref 136–145)
Total Bilirubin: 0.4 MG/DL (ref 0.2–1.0)
Total Protein: 5.5 g/dL — ABNORMAL LOW (ref 6.4–8.2)

## 2021-08-26 LAB — CULTURE, URINE: Colony count: >100000

## 2021-08-26 LAB — POCT GLUCOSE
POC Glucose: 97 mg/dL (ref 70–110)
POC Glucose: 91 mg/dL (ref 70–110)
POC Glucose: 102 mg/dL (ref 70–110)
POC Glucose: 111 mg/dL — ABNORMAL HIGH (ref 70–110)

## 2021-08-26 LAB — MAGNESIUM: Magnesium: 2.1 mg/dL (ref 1.6–2.6)

## 2021-08-26 MED ORDER — WARFARIN SODIUM 5 MG PO TABS
5 MG | Freq: Once | ORAL | Status: AC
Start: 2021-08-26 — End: 2021-08-26
  Administered 2021-08-26: 22:00:00 5 mg via ORAL

## 2021-08-26 MED FILL — K-PHOS-NEUTRAL 155-852-130 MG PO TABS: 155-852-130 MG | ORAL | Qty: 1

## 2021-08-26 MED FILL — CITALOPRAM HYDROBROMIDE 20 MG PO TABS: 20 MG | ORAL | Qty: 1

## 2021-08-26 MED FILL — LAMOTRIGINE 25 MG PO TABS: 25 MG | ORAL | Qty: 1

## 2021-08-26 MED FILL — IPRATROPIUM-ALBUTEROL 0.5-2.5 (3) MG/3ML IN SOLN: RESPIRATORY_TRACT | Qty: 3

## 2021-08-26 MED FILL — PREGABALIN 50 MG PO CAPS: 50 MG | ORAL | Qty: 1

## 2021-08-26 MED FILL — PRAVASTATIN SODIUM 20 MG PO TABS: 20 MG | ORAL | Qty: 2

## 2021-08-26 MED FILL — PANTOPRAZOLE SODIUM 40 MG PO TBEC: 40 MG | ORAL | Qty: 1

## 2021-08-26 MED FILL — NICOTINE 14 MG/24HR TD PT24: 14 MG/24HR | TRANSDERMAL | Qty: 1

## 2021-08-26 MED FILL — CEFTRIAXONE SODIUM 2 G IJ SOLR: 2 g | INTRAMUSCULAR | Qty: 2000

## 2021-08-26 MED FILL — ACETAMINOPHEN 325 MG PO TABS: 325 MG | ORAL | Qty: 2

## 2021-08-26 MED FILL — ASPIRIN LOW DOSE 81 MG PO CHEW: 81 MG | ORAL | Qty: 1

## 2021-08-26 MED FILL — WARFARIN SODIUM 5 MG PO TABS: 5 MG | ORAL | Qty: 1

## 2021-08-26 MED FILL — METOPROLOL SUCCINATE ER 25 MG PO TB24: 25 MG | ORAL | Qty: 1

## 2021-08-26 NOTE — Plan of Care (Signed)
Problem: Physical Therapy - Adult  Goal: By Discharge: Performs mobility at highest level of function for planned discharge setting.  See evaluation for individualized goals.  Description: Pt indep with all mobility, not in need of acute PT.    PLOF: Indep, lives with godmother in a 2 level home.   Outcome: Adequate for Discharge     PHYSICAL THERAPY EVALUATION/DISCHARGE    Patient: Mary [name] (56 y.o. female)  Date: 08/26/2021  Primary Diagnosis: Bacteremia [R78.81]  Pyelonephritis [N12]  Septicemia ([place]) [A41.9]  Positive blood cultures [R78.81]  Acute cystitis with hematuria [N30.01]  Septic shock ([place]) [A41.9, R65.21]  Sepsis ([place]) [A41.9]       Precautions: General Precautions     ASSESSMENT AND RECOMMENDATIONS:  Pt found sitting up in recliner in NAD, willing to work with therapy WFL strength. Pt stood and amb around the room with steady gait, no AD needed. She returned sitting u pin recliner at end of session with call bell and all needs met. Edu pt on frequent amb while in the hospital.    Patient does not require further skilled physical therapy intervention at this level of care.    Further Equipment Recommendations for Discharge: none    AMPAC:   At this time and based on an AM-PAC score of 24/24 (or **/20 if omitting stairs), no further PT is recommended upon discharge due to (i.e. patient at baseline functional status.etc.).  Recommend patient returns to prior setting with prior services.     This AMPAC score should be considered in conjunction with interdisciplinary team recommendations to determine the most appropriate discharge setting. Patient's social support, diagnosis, medical stability, and prior level of function should also be taken into consideration.     SUBJECTIVE:   Patient stated "Today is the first day I felt like doing anything."    OBJECTIVE DATA SUMMARY:     Past Medical History:   Diagnosis Date    Anxiety     Bipolar 1 disorder (HCC)     CHF (congestive heart failure) (HCC)      COPD (chronic obstructive pulmonary disease) (HCC)     COPD (chronic obstructive pulmonary disease) ([place])     H/O aortic valve replacement     PTSD (post-traumatic stress disorder)      Past Surgical History:   Procedure Laterality Date    CARDIAC CATHETERIZATION      CARDIAC VALVE SURGERY      mitral valve replacement    HAND/FINGER SURGERY UNLISTED Right     I&D right middle finger       Home Situation:  Social/Functional History  Lives With: Friend(s)  Type of Home: House  Home Layout: Two level  Home Access: Stairs to enter with rails  Entrance Stairs - Number of Steps: 6  Entrance Stairs - Rails: Both  Bathroom Shower/Tub: Administrator, Civil Service: Soil scientist: Grab bars in shower  Home Equipment: Cane (back brace)  ADL Assistance: Independent  Homemaking Assistance: Independent  Ambulation Assistance: Independent  Transfer Assistance: Independent  Critical Behavior:  Orientation  Orientation Level: Oriented X4       Strength:    Strength: Within functional limits    Tone & Sensation:   Tone: Normal  Sensation: Intact    Range Of Motion:  AROM: Within functional limits       Functional Mobility:  Bed Mobility:     Bed Mobility Training  Bed Mobility Training: No  Transfers:  Pharmacologist: Yes  Sit to Stand: Modified independent  Stand to Sit: Modified independent  Toilet Transfer: Modified independent (simulation with recliner)  Balance:        Balance  Sitting: Intact  Standing: Intact    Ambulation/Gait Training:     Gait  Overall Level of Assistance: Supervision  Distance (ft): 25 Feet   Pain:  Pain level pre-treatment: 4/10 -chronic LPB and neck pain  Pain level post-treatment: 4/10   Pain Intervention(s): Medication (see MAR); Rest, Ice, Repositioning  Response to intervention: Nurse notified, See doc flow    Activity Tolerance:   Activity Tolerance: Patient tolerated evaluation without incident  Please refer to the flowsheet for vital signs taken  during this treatment.    After treatment:   [x]          Patient left in no apparent distress sitting up in chair  []          Patient left in no apparent distress in bed  [x]          Call bell left within reach  [x]          Nursing notified  []          Caregiver present  []          Bed alarm activated  []          SCDs applied    COMMUNICATION/EDUCATION:   Patient Education  Education Given To: Patient  Education Provided: Role of Therapy;Plan of Care;Home Exercise Program  Education Method: Demonstration;Verbal;Teach Back  Barriers to Learning: None  Education Outcome: Verbalized understanding    Thank you for this referral.  Inis Sizer, PT  Minutes: 8      Eval Complexity: Decision Making: [place] AM-PAC Basic Mobility Inpatient Short Form (6-Clicks) Version 2    How much HELP from another person does the patient currently need.    (If the patient hasn't done an activity recently, how much help from another person do you think he/she would need if he/she tried?)   Total (Total A or Dep)   A Lot  (Mod to Max A)   A Little (Sup or Min A)   None (Mod I to I)   Turning from your back to your side while in a flat bed without using bedrails?   []  1 []  2 []  3 [x]  4   2. Moving from lying on your back to sitting on the side of a flat bed without using bedrails?    []  1 []  2 []  3 [x]  4   3. Moving to and from a bed to a chair (including a wheelchair)?   []  1 []  2 []  3 [x]  4   4. Standing up from a chair using your arms (e.g., wheelchair, or bedside chair)?   []  1 []  2 []  3 [x]  4   5. Walking in hospital room?   []  1 []  2 []  3 [x]  4   6. Climbing 3-5 steps with a railing?+   []  1 []  2 []  3 [x]  4   +If stair climbing cannot be assessed, skip item #6.  Sum responses from items 1-5.       At this time and based on an AM-PAC score of 24/24 (or **/20 if omitting stairs), no further PT is recommended upon discharge due to (i.e. patient at baseline functional status.etc.).  Recommend patient  returns to prior setting with prior services.

## 2021-08-26 NOTE — Plan of Care (Signed)
Problem: Safety - Adult  Goal: Free from fall injury  Outcome: Progressing     Problem: Chronic Conditions and Co-morbidities  Goal: Patient's chronic conditions and co-morbidity symptoms are monitored and maintained or improved  Outcome: Progressing     Problem: ABCDS Injury Assessment  Goal: Absence of physical injury  Outcome: Progressing     Problem: Pain  Goal: Verbalizes/displays adequate comfort level or baseline comfort level  Outcome: Progressing     Problem: Physical Therapy - Adult  Goal: By Discharge: Performs mobility at highest level of function for planned discharge setting.  See evaluation for individualized goals.  Description: Pt indep with all mobility, not in need of acute PT.    PLOF: Indep, lives with godmother in a 2 level home.   08/26/2021 1019 by Leanora Ivanoff, PT  Outcome: Adequate for Discharge

## 2021-08-26 NOTE — Progress Notes (Signed)
@[email or web address] @   Progress Note    Patient: Mary [name] MRN: Tracie Harrier  SSN: [id number]    Date of Birth: [birth year]  Age: 56 y.o.  Sex: female      Admit Date: 08/23/2021    LOS: 3 days     Subjective:   Patient feels well today with no complaints.     ROS:   Denies HA, nausea, vomit, chest pain, SOB, palpitation, fevers or chills. Mood is good.     --      HPI:  Mary [name] is a 56 y.o. Caucasian female with PMHx of anxiety, bipolar 1 disorder, HFpEF, COPD, Aortic valve replacement, and PTSD.      Patient presented to ED on 08/22/21 with fever, SOB, and cloudy urine. Patient states she started feeling poorly on Friday afternoon, by Saturday she started having a fever, chills, and noticed cloudy, malodorous urine with dysuria. Sunday she started feeling weak, dizzy and SOB which is what prompted her to call EMS. She denies nausea, vomiting, diarrhea at that time.      ED Course  Patient was seen on 08/22/2021 with WBC of 12.0.  Lactic acid 2.0 . UA with 2+ bacteria, moderate leukocyte esterase, too numerous to count WBC.  Patient was given IV ceftriaxone while in the ED and discharged on Keflex. The ED found 4/4 positive blood cultures for E coli and asked to return to the ED for treatment. Seen by Dr. 10/22/2021 ID who recommended IV Ceftriaxone and await sensitivity from blood cultures. Patient was having hypotension after given ativan dose for anxiety, also  tachycardia, fevers and eventually required pressors. She was admitted to ICU for E coli septicemia, pyelonephritis, and UTI.        08/23/21 CT Abd Pelvis:  1. Mildly edematous right kidney, with perirenal stranding. Trace left perirenal   stranding. Bilateral renal pelvis wall thickening and enhancement. Findings are   concerning for upper urinary tract infection/potentially very early   pyelonephritis (no striated renal enhancement at this time).   -Probable minimally complex cyst right kidney       2. Gallbladder is mildly distended, but no wall  thickening or pericholecystic   fluid, and no stones visible by CT. Nonspecific.       3. Mild colonic diverticulosis.       4. Additional chronic findings as above.          08/23/21 Chest X-ray:  No significant change. Similar degree of vascular congestion.      Objective:     Vitals:    08/26/21 0431 08/26/21 0715 08/26/21 1200 08/26/21 1500   BP: 133/72 127/71 119/72 (!) 143/75   Pulse: 85 69 80 79   Resp: 18 18 20 19    Temp: 100 F (37.8 C) 98.4 F (36.9 C) 99 F (37.2 C) 99.1 F (37.3 C)   TempSrc: Oral Oral Oral Oral   SpO2: 93% 93% 90% 93%   Weight: 206 lb 1.6 oz (93.5 kg)      Height:            Intake and Output:  Current Shift: No intake/output data recorded.  Last three shifts: 04/11 1901 - 04/13 0700  In: 2950 [P.O.:1450; I.V.:1000]  Out: 150 [Urine:150]            Physical Exam:   General: Well appearing, NAD  Skin: No rash, no lesions  HEENT: Normal appearing oropharynx; no LAD, no lesions; EOMI  Head: Normocephalic, atraumatic  Cardiovascular: regular rate  and rhythm, no obvious murmurs noted, no edema  Pulmonary: CTA bilaterally, no obvious wheezing, rales, or rhonchi  Abdomen: Bowel sounds normal, soft, non distended, non tender, no rebound or guarding.   MSK:  No deformities, spine non tender  Neuro: CN 2-12 intact, no focal deficits.   Psychiatric: normal mood, appropriate behavior, Alert and oriented x 3    Lab/Data Review:  reviewed      Assessment:     Principal Problem:    Sepsis (HCC)  Active Problems:    Bacteremia    Septic shock (HCC)  Resolved Problems:    * No resolved hospital problems. *      Plan:     Sepsis/Pyelonephritis/UTI/Ecoli Bacteremia  4/9 Patient found to have 4/4 positive blood cultures for E coli sensitive to Ceftriaxone, PCR detected enterobacteria  ID following Dr. Almyra Free  4/10 Repeat blood cultures no growth at 2 days  Will continue Ceftriaxone x 7 days  Lactic acid 1.1  Procalcitonin 3.70  4/10 rapid influenza a and B PCR: Negative  PT/OT evaluation  Will get CM  consult for medication assistance  Phos 2.1 will replete     COPD  Duoneb q6h PRN  Instructed patient to not use home inhaler while inpatient, she can ask for nebulizer as need for symptoms     Bipolar/Anxiety/PTSD  Citalopram 20 mg daily  Lamotrigine 25 mg q8h  Hydroxyzine prn        HFrEF  Furosemide 40 mg PRN home medication  Echo 08/20/21, shows diastolic dysfunction, EF 55-60%    Left Ventricle: Normal left ventricular systolic function with a visually estimated EF of 55 - 60%. Left ventricle is mildly dilated. Increased wall thickness. See diagram for wall motion findings. Diastolic function present with increased LAP with normal LV EF.    Right Ventricle: Right ventricle is mildly dilated. RVIDd is 4.3 cm.    Aortic Valve: Moderate regurgitation with a centrally directed jet. Vena contracta measures 0.5 cm.    Mitral Valve: Bioprosthetic valve that is well-seated with normal leaflet motion. MV mean gradient is 7 mmHg. Moderate regurgitation.    Left Atrium: Left atrium is mildly dilated. Left atrial volume index is mildly increased (35-41 mL/m2). LA Vol Index A/L is 38 mL/m2.    Pulmonary Arteries: Pulmonary hypertension not present. The estimated PASP is 30 mmHg.  Continue coumadin for aortic valve replacement, pharmacy consult for dosing, goal INR per cardiology has been 2.0-2.5.       Nicotine Dependence  Nicotine patch daily  Right lower lobe pulmonary nodule on CT scan 01/21/21 - follow up outpatient        DVT/GI Prophylaxis: warfarin, Protonix     Discussed with Pharmacy regarding coumadin dosing. Patient taking 5mg  daily at home. Upon discharge, patient to be started on Levofloxacin. Will decrease coumadin dose to 4mg  daily until 4/24. Patient to follow up with her Coumadin clinic outpatient.   Transition rocephin to PO levofloxacin 750mg  po daily for ecoli bacteremia with pyelonephritis with end date 09/06/2021. Need to discharge patient with medications on hand. Will plan for discharge tomorrow 08/27/21  as they are unable to get it filled before 5 oclock.     Signed By: , MD     August 26, 2021

## 2021-08-26 NOTE — Progress Notes (Signed)
[facility]  (A Division of [facility])      Progress Note      Date of Admission: 08/23/2021    Date of Note: 08/26/2021      Reason for Referral: GNR sepsis  Referring Physician: Thamas Jaegers, PA-C    Micro from this admission:   4/9 respiratory viral panel: negative  4/9 blood culture: 4 out of 4 gram-negative Pan-sensitive E.coli  4/9 urine cx: >100K E.coli- pan-sensitive  4/10 blood cultures:NGTD x2  4/10 blood cx: ngtd x 2  4/10 rapid influenza a and B PCR: Negative    Current Antimicrobials:    Prior Antimicrobials:  Ceftriaxone 4/9 present        Assessment:         E.coli  sepsis suspect GU origin: 4/9 UA with too numerous to count WBCs, 2+ bacteria, moderate leukocyte esterase, large blood; repeat UA on 6/10 with 6-8 WBCs 3+ bacteria  Suspected pyelonephritis on the right : No evidence of obstruction or hydronephrosis noted on CT scan  Urinary tract infection- CT scan suggestive of early pyelonephritis ; no obstructive uropathy  Right lower lobe pulmonary nodule 3 mm  Bipolar disorder type I with anxiety  COPD  CHF  History of aortic valve replacement    Plan:   Continue with ceftriaxone for E.coli sepsis.   - can transition to po levofloxacin 750 mg po daily for sepsis + UTI + pyelonephritis  -  stop date of 4/24  Follow-up 4/10 blood cultures- negative   Trend CBC, BMP      Helyn Numbers, DO  [facility]  [address], [place], [place] [address]  Office: [phone], Ext [phone]      Lines / Catheters:  peripheral      Subjective:   Patient seen and examined.  Feeling little better today.  Shortness of breath is better.  Still somewhat anxious about being in the hospital in cases of care.  Had a fever last night to 102.9 around 8 PM.  Improved with Tylenol    HPI:  Mary [name] is a 56 year old female with a history of CHF, COPD, anxiety with bipolar 1 disorder who presented to the emergency department yesterday  evening secondary to fevers chills and generalized malaise.  She been having fevers since Friday and had a Tmax of 101.7.  She had called EMS which brought her to the emergency department where she was noted to have hypotension with BPs of 85/55.  At that time she was diagnosed with a UTI given IV fluids and was discharged home on Keflex.  She was called back to the ER earlier this morning secondary to blood cultures being noted to be positive in 4 out of 4 bottles from her recent ED visit.  She continues to have ongoing fevers tachycardia and chills.  She has some right-sided flank pain.  She had been given a dose of ceftriaxone yesterday and is doing for a second dose of ceftriaxone this evening.  Overall she feels slightly better than she had yesterday although not too much.  No associated nausea vomiting or diarrhea.  Poor appetite           Objective:      BP 119/72   Pulse 80   Temp 99 F (37.2 C) (Oral)   Resp 20   Ht 5\' 3"  (1.6 m)   Wt 206 lb 1.6 oz (93.5 kg)   SpO2 90%   BMI 36.51  kg/m   Temp (24hrs), Avg:100 F (37.8 C), Min:98.4 F (36.9 C), Max:102.9 F (39.4 C)        General:   awake alert and oriented   Skin:   no rashes or skin lesions noted on limited exam   HEENT:  Normocephalic, atraumatic, PERRL, EOMI, no scleral icterus or pallor; no conjunctival hemmohage;    Lymph Nodes:   no cervical or inguinal adenopathy   Lungs:   non-labored, occasional wheezing bilaterally, diminished at bases   Heart:  RRR, s1 and s2; no murmurs rubs or gallops, no edema, + pedal pulses   Abdomen:  soft, non-distended, active bowel sounds. Non-tender   Genitourinary:  deferred   Extremities:   no clubbing, cyanosis; no joint effusions or swelling; Full ROM of all large joints to the upper and lower extremities; muscle mass appropriate for age   Neurologic:  No gross focal sensory abnormalities; equal muscle strength to upper and lower extremities. Speech appropirate. Cranial nerves intact   Psychiatric:   Anxious, pressured speech       Labs: Results:   Chemistry Recent Labs     08/23/21  2230 08/24/21  0530 08/25/21  0110 08/26/21  0428   NA 141 144 141 141   K 3.9 3.4* 3.9 3.4*   CL 118* 119* 117* 111   CO2 20* 19* 19* 23   BUN 22* 19* 19* 11   GLOB 3.2 2.5  --  3.3      CBC w/Diff Recent Labs     08/24/21  1005 08/25/21  0110 08/26/21  0428   WBC 3.6* 6.8 6.4   RBC 3.54* 3.52* 3.48*   HGB 10.6* 10.5* 10.5*   HCT 32.0* 31.7* 31.8*   PLT 115* 135 174            No results found for: SDES No components found for: CULT     Results       Procedure Component Value Units Date/Time    Culture, MRSA, Screening [[id number]] Collected: 08/23/21 2330    Order Status: Canceled Specimen: Nares     Culture, Blood 1 [[id number]] Collected: 08/23/21 2230    Order Status: Completed Specimen: Blood Updated: 08/26/21 0643     Special Requests NO SPECIAL REQUESTS        Culture NO GROWTH 3 DAYS       Blood Culture 2 [[id number]] Collected: 08/23/21 2220    Order Status: Completed Specimen: Blood Updated: 08/26/21 0643     Special Requests NO SPECIAL REQUESTS        Culture NO GROWTH 3 DAYS       COVID-19 & Influenza Combo [[id number]] Collected: 08/23/21 2200    Order Status: Completed Specimen: Nasopharyngeal Updated: 08/23/21 2323     SARS-CoV-2, PCR Not detected        Comment: Not Detected results do not preclude SARS-CoV-2 infection and should not be used as the sole basis for patient management decisions.Results must be combined with clinical observations, patient history, and epidemiological information.        Rapid Influenza A By PCR Not detected        Rapid Influenza B By PCR Not detected        Comment: Testing was performed using cobas Liat SARS-CoV-2 and Influenza A/B nucleic acid assay.  This test is a multiplex Real-Time Reverse Transcriptase Polymerase Chain Reaction (RT-PCR) based in vitro diagnostic test intended for the qualitative detection of nucleic acids from SARS-CoV-2, Influenza A, and Influenza  B in  nasopharyngeal for use under the FDA's Emergency Use Authorization(EAU) only.       Fact sheet for Patients: [email or web address]://www.fda.gov/media/142192/download  Fact sheet for Healthcare Providers: https://www.fda.fov/media/142191/download         Culture, Blood 2 [[id number]][[id number]] Collected: 08/23/21 1059    Order Status: Completed Specimen: Blood Updated: 08/26/21 0643     Special Requests NO SPECIAL REQUESTS        Culture NO GROWTH 3 DAYS       COVID-19 & Influenza Combo [[id number]][[id number]] Collected: 08/23/21 1054    Order Status: Completed Specimen: Nasopharyngeal Updated: 08/23/21 1130     SARS-CoV-2, PCR Not detected        Comment: Not Detected results do not preclude SARS-CoV-2 infection and should not be used as the sole basis for patient management decisions.Results must be combined with clinical observations, patient history, and epidemiological information.        Rapid Influenza A By PCR Not detected        Rapid Influenza B By PCR Not detected        Comment: Testing was performed using cobas Liat SARS-CoV-2 and Influenza A/B nucleic acid assay.  This test is a multiplex Real-Time Reverse Transcriptase Polymerase Chain Reaction (RT-PCR) based in vitro diagnostic test intended for the qualitative detection of nucleic acids from SARS-CoV-2, Influenza A, and Influenza B in nasopharyngeal for use under the FDA's Emergency Use Authorization(EAU) only.       Fact sheet for Patients: [email or web address]://www.fda.gov/media/142192/download  Fact sheet for Healthcare Providers: https://www.fda.fov/media/142191/download         Culture, Blood 1 [[id number]][[id number]] Collected: 08/23/21 1039    Order Status: Completed Specimen: Blood Updated: 08/26/21 0643     Special Requests NO SPECIAL REQUESTS        Culture NO GROWTH 3 DAYS       Culture, Urine [[id number]][[id number]]  (Abnormal)  (Susceptibility) Collected: 08/22/21 2005    Order Status: Completed Specimen: Urine, clean catch Updated: 08/26/21 0740     Special Requests NO SPECIAL REQUESTS        Colony count  --        >100,000  COLONIES/mL       Culture Escherichia coli       Susceptibility        Escherichia coli      BACTERIAL SUSCEPTIBILITY PANEL MIC      amikacin <=2 ug/mL Sensitive      ampicillin 4 ug/mL Sensitive      ampicillin-sulbactam <=2 ug/mL Sensitive      ceFAZolin <=4 ug/mL Sensitive      cefepime <=1 ug/mL Sensitive      cefOXitin <=4 ug/mL Sensitive      cefTAZidime <=1 ug/mL Sensitive      cefTRIAXone <=1 ug/mL Sensitive      ciprofloxacin <=0.25 ug/mL Sensitive      gentamicin <=1 ug/mL Sensitive      levofloxacin <=0.12 ug/mL Sensitive      meropenem <=0.25 ug/mL Sensitive      nitrofurantoin <=16 ug/mL Sensitive      piperacillin-tazobactam <=4 ug/mL Sensitive      tobramycin <=1 ug/mL Sensitive      trimethoprim-sulfamethoxazole <=20 ug/mL Sensitive                           Blood Culture 2 [[id number]][[id number]]  (Abnormal) Collected: 08/22/21 1959    Order Status: Completed Specimen: Blood Updated: 08/25/21 0903     Special Requests NO SPECIAL  REQUESTS        Gram stain       AEROBIC AND ANAEROBIC BOTTLES Gram negative rods                  SMEAR CALLED TO AND CORRECTLY REPEATED BY: PA D POOLE. ED. 0820. 08/23/21 BY [id number]           Culture       Escherichia coli GROWING IN BOTH BOTTLES DRAWN No Site Indicated REFER TO [id number] FOR SENSITIVITIES          Culture, Blood, PCR ID Panel [[id number]] Collected: 08/22/21 1954    Order Status: Canceled     Culture, Blood 1 [[id number]]  (Abnormal)  (Susceptibility) Collected: 08/22/21 1953    Order Status: Completed Specimen: Blood Updated: 08/25/21 0902     Special Requests NO SPECIAL REQUESTS        Gram stain       AEROBIC AND ANAEROBIC BOTTLES Gram negative rods                  SMEAR CALLED TO AND CORRECTLY REPEATED BY: PA D POOLE, ED, 0820, 08/23/21 BY [id number]           Culture       Escherichia coli GROWING IN BOTH BOTTLES DRAWN SITE=RAC          Susceptibility        Escherichia coli      BACTERIAL SUSCEPTIBILITY PANEL MIC      amikacin <=2 ug/mL Sensitive       ampicillin 4 ug/mL Sensitive      ampicillin-sulbactam <=2 ug/mL Sensitive      ceFAZolin <=4 ug/mL Sensitive      cefepime <=1 ug/mL Sensitive      cefOXitin <=4 ug/mL Sensitive      cefTAZidime <=1 ug/mL Sensitive      cefTRIAXone <=1 ug/mL Sensitive      ciprofloxacin <=0.25 ug/mL Sensitive      gentamicin <=1 ug/mL Sensitive      levofloxacin <=0.12 ug/mL Sensitive      meropenem <=0.25 ug/mL Sensitive      piperacillin-tazobactam <=4 ug/mL Sensitive      tobramycin <=1 ug/mL Sensitive      trimethoprim-sulfamethoxazole <=20 ug/mL Sensitive                           Culture, Blood, PCR ID Panel [[id number]]  (Abnormal) Collected: 08/22/21 1953    Order Status: Completed Specimen: Blood Updated: 08/23/21 1533     Accession Number [id number]     Enterococcus faecalis by PCR Not detected        Enterococcus faecium by PCR Not detected        Listeria monocytogenes by PCR Not detected        STAPHYLOCOCCUS Not detected        Staphylococcus Aureus Not detected        Staphylococcus epidermidis by PCR Not detected        Staphylococcus lugdunensis by PCR Not detected        STREPTOCOCCUS Not detected        Streptococcus agalactiae (Group B) Not detected        Strep pneumoniae Not detected        Strep pyogenes,(Grp. A) Not detected        Acinetobacter calcoac baumannii complex by PCR Not detected        Bacteroides fragilis by PCR Not  detected        Enterobacteriaceae by PCR Detected        Enterobacter cloacae complex by PCR Not detected        Escherichia Coli Detected        Klebsiella aerogenes by PCR Not detected        Klebsiella oxytoca by PCR Not detected        Klebsiella pneumoniae group by PCR Not detected        Proteus by PCR Not detected        Salmonella species by PCR Not detected        Serratia marcescens by PCR Not detected        Haemophilus Influenzae by PCR Not detected        Neisseria meningitidis by PCR Not detected        Pseudomonas aeruginosa Not detected        Stenotrophomonas  maltophilia by PCR Not detected        Candida albicans by PCR Not detected        Candida auris by PCR Not detected        Candida glabrata Not detected        Candida krusei by PCR Not detected        Candida parapsilosis by PCR Not detected        Candida tropicalis by PCR Not detected        Cryptococcus neoformans/gattii by PCR Not detected        Resistant gene targets            Resistant gene ctx-m by PCR Not detected        Resistant gene imp by PCR Not detected        KPC (Carbapenem resistance gene) Not detected        Colistin Resistance mcr-1 gene by PCR Not detected        Resistant gene ndm by PCR Not detected        Resistant gene oxa-48-like by pcr Not detected        Resistant gene vim by PCR Not detected        [employer] test comment       False positive results may rarely occur. Correlate with clinical,epidemiologic, and other laboratory findings           Comment: Please see BCID Interpretation Guide in EPIC Links       Respiratory Panel, Molecular, with COVID-19 (Restricted: peds pts or suitable admitted adults) [[id number]] Collected: 08/22/21 1940    Order Status: Completed Specimen: Nasopharyngeal Updated: 08/22/21 2118     Adenovirus by PCR Not detected        Coronavirus 229E by PCR Not detected        Coronavirus HKU1 by PCR Not detected        Coronavirus NL63 by PCR Not detected        Coronavirus OC43 by PCR Not detected        SARS-CoV-2, PCR Not detected        Human Metapneumovirus by PCR Not detected        Rhinovirus Enterovirus PCR Not detected        Influenza A by PCR Not detected        Influenza B PCR Not detected        Parainfluenza 1 PCR Not detected        Parainfluenza 2 PCR Not detected  Parainfluenza 3 PCR Not detected        Parainfluenza 4 PCR Not detected        Respiratory Syncytial Virus by PCR Not detected        Bordetella parapertussis by PCR Not detected        Bordetella pertussis by PCR Not detected        Chlamydophila Pneumonia PCR Not detected         Mycoplasma pneumo by PCR Not detected                     Imaging:     All available imaging since presentation reviewed as per EPIC

## 2021-08-26 NOTE — Progress Notes (Signed)
OCCUPATIONAL THERAPY EVALUATION/DISCHARGE    Patient: Mary [name] (56 y.o. female)  Date: 08/26/2021  Primary Diagnosis: Bacteremia [R78.81]  Pyelonephritis [N12]  Septicemia ([place]) [A41.9]  Positive blood cultures [R78.81]  Acute cystitis with hematuria [N30.01]  Septic shock ([place]) [A41.9, R65.21]  Sepsis ([place]) [A41.9]  Precautions: General Precautions  PLOF: Patient was independent with self-care and used a cane prn for functional mobility PTA depending on back pain. Patient also uses back brace if needed.      ASSESSMENT AND RECOMMENDATIONS:  Patient cleared to participate in OT evaluation by RN whom was present administering medications. Upon entering the room, patient was seated in chair, alert, and agreeable to participate in OT evaluation on room air. [place] O2 99% seated in chair, desat to 93% with activity and increased to 96% within 30 seconds with PLB techniques. Patient educated on energy conservation techniques, pursed lip breathing, and self pacing during daily activities. Patient given energy conservation handout, reviewed with this OT and verbalized understanding. Patient is independent - modified independent with basic self-care seated and standing and modified independent with functional transfers in preparation for ADLs. Based on the objective data described below, the patient presents with no deficits that impede pt function with ADLs, functional transfers, and functional mobility in preparation for selfcare tasks. Maximum therapeutic gains met at current level of care and patient will be discharged from occupational therapy at this time.    Further Equipment Recommendations for Discharge: shower chair to conserve energy    AMPAC: At this time and based on an AM-PAC score of 24/24, no further OT is recommended upon discharge due to patient at functional baseline for ADLs. Recommend patient returns to prior setting with prior services.       This AMPAC score should be considered in  conjunction with interdisciplinary team recommendations to determine the most appropriate discharge setting. Patient's social support, diagnosis, medical stability, and prior level of function should also be taken into consideration.     SUBJECTIVE:   Patient stated "I work on slow breathing with my son"    OBJECTIVE DATA SUMMARY:     Past Medical History:   Diagnosis Date    Anxiety     Bipolar 1 disorder (HCC)     CHF (congestive heart failure) (HCC)     COPD (chronic obstructive pulmonary disease) (HCC)     COPD (chronic obstructive pulmonary disease) ([place])     H/O aortic valve replacement     PTSD (post-traumatic stress disorder)      Past Surgical History:   Procedure Laterality Date    CARDIAC CATHETERIZATION      CARDIAC VALVE SURGERY      mitral valve replacement    HAND/FINGER SURGERY UNLISTED Right     I&D right middle finger       Home Situation:   Social/Functional History  Lives With: Other (comment) (roommates)  Type of Home: House  Home Layout: One level  Home Access: Stairs to enter with rails  Entrance Stairs - Number of Steps: 6  Entrance Stairs - Rails: Both  Bathroom Shower/Tub: Administrator, Civil Service: Soil scientist: Grab bars in shower  Home Equipment: Cane (back brace)  ADL Assistance: Independent  _0   Right hand dominant   _1   Left hand dominant    Cognitive/Behavioral Status:  Orientation  Orientation Level: Oriented X4    Skin: Intact  Edema: None noted    Vision/Perceptual:    Vision  Vision: Within Functional Limits  Coordination: BUE  Coordination: Within functional limits    Balance:  Intact    Strength: BUE  Strength: Within functional limits    Tone & Sensation: BUE  Tone: Normal  Sensation: Intact    Range of Motion: BUE  AROM: Within functional limits    Functional Mobility and Transfers for ADLs:  Bed Mobility:  Bed Mobility Training  Bed Mobility Training: No    Transfers:  Pharmacologist: Yes  Sit to Stand: Modified  independent  Stand to Sit: Modified independent  Toilet Transfer: Modified independent (simulation with recliner)    ADL Assessment:   Feeding: Independent (medications administered via Therapist, sports)  Grooming: Modified independent   UE Bathing: Modified independent   LE Bathing: Modified independent   UE Dressing: Modified independent   LE Dressing: Modified independent  (Bend fwd method to don B shoes)  Toileting: Modified independent       Pain:  Pain level pre-treatment: 0/10   Pain level post-treatment: 0/10   Pain Intervention(s): Rest, Ice, Repositioning   Response to intervention: Nurse notified    Activity Tolerance:   Activity Tolerance: Patient Tolerated treatment well  Please refer to the flowsheet for vital signs taken during this treatment.    After treatment:   _0  Patient left in no apparent distress sitting up in chair  _1  Patient left in no apparent distress in bed  _2  Call bell left within reach  _3  Nursing notified  _4  Caregiver present  _5  Bed alarm activated    COMMUNICATION/EDUCATION:   Patient Education  Education Given To: Patient  Education Provided: Role of Therapy;Plan of Catering manager  Education Method: Demonstration;Verbal;Teach Back;Printed Information/Hand-outs  Barriers to Learning: None  Education Outcome: Verbalized understanding;Demonstrated understanding    Thank you for this referral.  Newell Coral, OTR/L  Minutes: 17    Eval Complexity: Decision Making: Parchment AM-PAC Daily Activity Inpatient Short Form (6-Clicks)*    How much HELP from another person does the patient currently need.    (If the patient hasn't done an activity recently, how much help from another person do you think he/she would need if he/she tried?)   Total (Total A or Dep)   A Lot  (Mod to Max A)   A Little (Sup or Min A)   None (Mod I to I)   Putting on and taking off regular lower body clothing?   _6  1 _7  2 _8  3 _9  4   2. Bathing (including washing, rinsing,      drying)?     _10  1 _11  2 _12  3 _13  4   3. Toileting, which includes using toilet, bedpan or urinal?   _14  1 _15  2 _16  3 _17  4   4. Putting on and taking off regular upper body clothing?   _18  1 _19  2 _20  3 _21  4   5. Taking care of personal grooming such as brushing teeth?   _22  1 _23  2 _24  3 _25  4   6. Eating meals?   _26  1 _27  2 _28  3 _29  4

## 2021-08-26 NOTE — Discharge Summary (Incomplete)
@[email or web address] @    Discharge Summary     Patient: Mary [name] MRN: Tracie Harrier  SSN: [id number]    Date of Birth: 1965-07-29  Age: 56 y.o.  Sex: female       Admit Date: 08/23/2021    Discharge Date: 08/26/2021      Admission Diagnoses: Bacteremia [R78.81]  Pyelonephritis [N12]  Septicemia (HCC) [A41.9]  Positive blood cultures [R78.81]  Acute cystitis with hematuria [N30.01]  Septic shock (HCC) [A41.9, R65.21]  Sepsis (HCC) [A41.9]    Discharge Diagnoses:    UTI/Pyelonephritis  E coli Septicemia      Discharge Condition: Stable    HPI  Mary [name] is a 56 y.o. Caucasian female with PMHx of anxiety, bipolar 1 disorder, HFpEF, COPD, Aortic valve replacement, and PTSD.      Patient presented to ED on 08/22/21 with fever, SOB, and cloudy urine. Patient states she started feeling poorly on Friday afternoon, by Saturday she started having a fever, chills, and noticed cloudy, malodorous urine with dysuria. Sunday she started feeling weak, dizzy and SOB which is what prompted her to call EMS. She denied nausea, vomiting, diarrhea at that time.      ED Course  Patient was seen on 08/22/2021 with WBC of 12.0.  Lactic acid 2.0 . UA with 2+ bacteria, moderate leukocyte esterase, too numerous to count WBC.  Patient was given IV ceftriaxone while in the ED and discharged on Keflex. The ED found 4/4 positive blood cultures for E coli and asked to return to the ED for treatment. Seen by Dr. 10/22/2021 ID who recommended IV Ceftriaxone and await sensitivity from blood cultures. Patient was having hypotension after given ativan dose for anxiety, also  tachycardia, fevers and eventually required pressors. She was admitted to ICU for E coli septicemia, pyelonephritis, and UTI.    Hospital Course:   Patient was admitted 4/10 for UTI/Pyelonephritis/Sepsis started on IV Ceftriaxone, became hypotensive and requiring pressors so sent to the ICU. Was down graded on 4/12, sensitives showed pansensitive e coli, transition to PO Levofloxacin 750  mg PO last dose 4/24. INR high, have been holding Coumadin dose.     Sepsis/Pyelonephritis/UTI  4/9 Patient found to have 4/4 positive blood cultures for E coli likely secondary to UTI sensitive to Ceftriaxone, PCR detected enterobacteria  4/10 Repeat blood cultures no growth at 3 days  Can transition to Levofloxacin 750 mg PO daily until 4/24  Lactic acid 1.1, Procalcitonin 3.70  4/10 rapid influenza a and B PCR: Negative  PT/OT evaluation- no further treatment needed  Will get CM consult for medication assistance  K 3.4 will replete     COPD  Proair and Combivent home meds     Bipolar/Anxiety/PTSD  Citalopram 20 mg daily  Lamotrigine 25 mg q8h  Hydroxyzine prn        HFrEF  Furosemide 40 mg PRN home medication  Echo 08/20/21, shows diastolic dysfunction, EF 55-60%  Per patient INR 2.5-3.5 goal, takes Coumadin 5 mg nightly        Nicotine Dependence  Nicotine patch daily  Right lower lobe pulmonary nodule on CT scan 01/21/21 - follow up outpatient      Consults: Infectious Disease Dr. 03/23/21    Significant Diagnostic Studies:     08/20/21 Echo    Left Ventricle: Normal left ventricular systolic function with a visually estimated EF of 55 - 60%. Left ventricle is mildly dilated. Increased wall thickness. See diagram for wall motion findings. Diastolic function present with  increased LAP with normal LV EF.    Right Ventricle: Right ventricle is mildly dilated. RVIDd is 4.3 cm.    Aortic Valve: Moderate regurgitation with a centrally directed jet. Vena contracta measures 0.5 cm.    Mitral Valve: Bioprosthetic valve that is well-seated with normal leaflet motion. MV mean gradient is 7 mmHg. Moderate regurgitation.    Left Atrium: Left atrium is mildly dilated. Left atrial volume index is mildly increased (35-41 mL/m2). LA Vol Index A/L is 38 mL/m2.    Pulmonary Arteries: Pulmonary hypertension not present. The estimated PASP is 30 mmHg.    08/23/21 CT Abd Pelvis  1. Mildly edematous right kidney, with perirenal  stranding. Trace left perirenal   stranding. Bilateral renal pelvis wall thickening and enhancement. Findings are   concerning for upper urinary tract infection/potentially very early   pyelonephritis (no striated renal enhancement at this time).   -Probable minimally complex cyst right kidney       2. Gallbladder is mildly distended, but no wall thickening or pericholecystic   fluid, and no stones visible by CT. Nonspecific.       3. Mild colonic diverticulosis.       4. Additional chronic findings as above.       Physical Examination:   General:  Alert, cooperative, no distress, appears stated age.   Head:  Normocephalic, without obvious abnormality, atraumatic.   Eyes:  Conjunctivae/corneas clear. PERRL, EOMs intact.   Throat: Lips, mucosa, and tongue normal.    Neck: Supple, symmetrical, trachea midline, no adenopathy,    Back:   No CVA tenderness.   Lungs:   Clear to auscultation bilaterally.   Chest wall:  No tenderness or deformity.   Heart:  Regular rate and rhythm, no appreciable murmur      Abdomen: Soft, non-tender. Bowel sounds normal. No masses,  No organomegaly.   Extremities: Extremities normal, atraumatic, no cyanosis or edema.   Pulses: 2+ and symmetric all extremities.   Neurologic: CNII-XII grossly intact. No focal motor or sensory deficit.     Disposition: home        Activity: activity as tolerated  Diet: regular diet  Wound Care: as directed    Follow ups: [facility]CP, [facility] Dr. Lenn Sink within 7 days      Further Recommendations  F/u on repeat blood cultures  Levofloxacin 750 mg PO daily until 4/24  Pulmonary Nodule on CT scan 01/21/21  Urine Culture? CBC, CMP      Signed By: Barnie Del     August 26, 2021

## 2021-08-26 NOTE — Progress Notes (Signed)
[facility] Dosing Services    Consult for Warfarin Dosing by Pharmacy by Dr. Sebastian Ache  Consult provided for this 56 y.o.  female , for indication of bioprosthetic Heart Valve Replacement  Day of Therapy: ongoing  Home dose: 5mg  daily  Dose to achieve an INR goal of 2.0 to 3.0    Warfarin 5mg  today    Significant drug interactions: None  Previous dose given    PT/INR Lab Results   Component Value Date/Time    INR 2.2 08/26/2021 04:28 AM    INR 2.2 04/07/2020 09:20 AM    INREXT 2.0 05/27/2021 12:00 AM       Platelets Lab Results   Component Value Date/Time    PLT 174 08/26/2021 04:28 AM      H/H Lab Results   Component Value Date/Time    HGB 10.5 08/26/2021 04:28 AM        Pharmacy to follow daily and will provide subsequent Warfarin dosing based on clinical status.     Signed Tuyen Uncapher, RPH

## 2021-08-26 NOTE — Care Coordination-Inpatient (Signed)
..Case Management Assessment  Initial Evaluation    Date/Time of Evaluation: 08/26/2021 3:57 PM  Assessment Completed by: Mary [name]    If patient is discharged prior to next notation, then this note serves as note for discharge by case management.    Patient Name: Mary [name]                   Date of Birth: 02/26/1966  Diagnosis: Bacteremia [R78.81]  Pyelonephritis [N12]  Septicemia (HCC) [A41.9]  Positive blood cultures [R78.81]  Acute cystitis with hematuria [N30.01]  Septic shock (HCC) [A41.9, R65.21]  Sepsis (HCC) [A41.9]                   Date / Time: 08/23/2021  4:58 PM    Patient Admission Status: Inpatient   Readmission Risk (Low < 19, Mod (19-27), High > 27): Readmission Risk Score: 16.4    Current PCP: Mary Sink, MD  PCP verified by CM? (P) Yes    Chart Reviewed: Yes      History Provided by: (P) Patient  Patient Orientation: (P) Alert and Oriented    Patient Cognition: (P) Alert    Hospitalization in the last 30 days (Readmission):  No    If yes, Readmission Assessment in CM Navigator will be completed.    Advance Directives:      Code Status: Full Code   Patient's Primary Decision Maker is:        Discharge Planning:    Patient lives with: (P) Family Members Type of Home: (P) House  Primary Care Giver: (P) Self  Patient Support Systems include: (P) Family Members   Current Financial resources: (P) Medicaid, Medicare  Current community resources:    Current services prior to admission: (P) Durable Medical Equipment            Current DME: (P) Cane            Type of Home Care services:       ADLS  Prior functional level: (P) Independent in ADLs/IADLs  Current functional level: (P) Independent in ADLs/IADLs    PT AM-PAC:   /24  OT AM-PAC:   /24    Family can provide assistance at DC: (P) Other (comment) (Unknown)  Would you like Case Management to discuss the discharge plan with any other family members/significant others, and if so, who? (P) No  Plans to Return to Present Housing: (P)  Yes  Other Identified Issues/Barriers to RETURNING to current housing: No concerns   Potential Assistance needed at discharge: (P) N/A            Potential DME:    Patient expects to discharge to: (P) House  Plan for transportation at discharge: (P) Other (see comment) (Pt will transport assistance at [employer])    Financial    Payor: OPTIMA MEDICARE / Plan: [employer] MEDICARE / Product Type: *No Product type* /     Does insurance require precert for SNF: Yes    Potential assistance Purchasing Medications: (P) Yes (Pt stated she does not have any funds to pay co-pay for medication)  Meds-to-Beds request:        [id number] [place], [place] - [address] - P [phone] Carmon Ginsberg [id number]  [address] [place] [address]  Phone: [phone] Fax: [phone]      Notes:    Factors facilitating achievement of predicted outcomes: Motivated, Cooperative, and Pleasant    Barriers to discharge: Limited family support  and No caregiver support. Patient needs assistance with paying for medications.        08/26/21 1552   Service Assessment   Patient Orientation Alert and Oriented   Cognition Alert   History Provided By Patient   Primary Caregiver Self   Support Systems Family Members   PCP Verified by CM Yes   Last Visit to PCP Within last 6 months   Prior Functional Level Independent in ADLs/IADLs   Current Functional Level Independent in ADLs/IADLs   Can patient return to prior living arrangement Yes   Ability to make needs known: Fair   Family able to assist with home care needs: Other (comment)  (Unknown)   Would you like for me to discuss the discharge plan with any other family members/significant others, and if so, who? No   Financial Resources Medicaid;Medicare   Social/Functional History   Lives With Family  (GodDaugher, Mary [name])   Type of Home House   Home Layout One level   Entrance Stairs - Number of Steps 6   Bathroom Shower/Tub Tub/Shower unit   Water quality scientist   Receives Help From Family   ADL Assistance Independent   Homemaking Assistance Independent   Homemaking Responsibilities No   Ambulation Assistance Independent   Copy No   Education [employer] Graduate   Occupation On disability   Type of Occupation Pt is disabled. Pt receives SSI and SSA benefits   Discharge Planning   Type of Residence House   Living Arrangements Family Members   Current Services Prior To Admission Durable Medical Equipment   Current DME Prior to [employer] Needed N/A   DME Ordered? No   Potential Assistance Purchasing Medications Yes  (Pt stated she does not have any funds to pay co-pay for medication)   Patient expects to be discharged to: House   One/Two Story Residence Two story   Services At/After Discharge   Transition of Care Consult (CM Consult) Discharge Planning   Services At/After Discharge None   [employer] Information Provided? No   Mode of Transport at Discharge Other (see comment)  (Pt will transport assistance at dc)   Confirm Follow Up Transport Other (see comment)  (Pt will transport assistance at dc)   Condition of Participation: Discharge Planning   The Plan for Transition of Care is related to the following treatment goals: Home         Additional Case Management Notes: Home     The Plan for Transition of Care is related to the following treatment goals of Bacteremia [R78.81]  Pyelonephritis [N12]  Septicemia (HCC) [A41.9]  Positive blood cultures [R78.81]  Acute cystitis with hematuria [N30.01]  Septic shock (HCC) [A41.9, R65.21]  Sepsis (HCC) [A41.9]    IF APPLICABLE: The Patient and/or patient representative Mary [name] and her family were provided with a choice of provider and agrees with the discharge plan. Freedom of choice list with basic dialogue that supports the patient's individualized plan of care/goals and shares the quality data  associated with the providers was provided to:     Patient Representative Name:       The Patient and/or Patient Representative Agree with the Discharge Plan?      Mary [name], MSW  Care Manager

## 2021-08-27 ENCOUNTER — Ambulatory Visit: Payer: MEDICARE | Attending: Cardiovascular Disease | Primary: Family Medicine

## 2021-08-27 LAB — POCT GLUCOSE
POC Glucose: 107 mg/dL (ref 70–110)
POC Glucose: 109 mg/dL (ref 70–110)
POC Glucose: 111 mg/dL — ABNORMAL HIGH (ref 70–110)
POC Glucose: 98 mg/dL (ref 70–110)

## 2021-08-27 LAB — PROTIME-INR
INR: 2.2 — ABNORMAL HIGH (ref 0.8–1.2)
Protime: 24.5 s — ABNORMAL HIGH (ref 11.5–15.2)

## 2021-08-27 MED ORDER — LAMOTRIGINE 25 MG PO TABS
25 MG | ORAL_TABLET | Freq: Three times a day (TID) | ORAL | 0 refills | Status: DC
Start: 2021-08-27 — End: 2021-08-27

## 2021-08-27 MED ORDER — HYDROXYZINE HCL 25 MG PO TABS
25 MG | ORAL_TABLET | Freq: Three times a day (TID) | ORAL | 0 refills | Status: DC | PRN
Start: 2021-08-27 — End: 2021-08-27

## 2021-08-27 MED ORDER — LEVOFLOXACIN 750 MG PO TABS
750 MG | ORAL_TABLET | Freq: Every day | ORAL | 0 refills | Status: DC
Start: 2021-08-27 — End: 2021-08-27

## 2021-08-27 MED ORDER — PRAVASTATIN SODIUM 40 MG PO TABS
40 MG | ORAL_TABLET | Freq: Every day | ORAL | 0 refills | Status: DC
Start: 2021-08-27 — End: 2022-03-24

## 2021-08-27 MED ORDER — IPRATROPIUM-ALBUTEROL 0.5-2.5 (3) MG/3ML IN SOLN
0.5-2.533 (3) MG/3ML | RESPIRATORY_TRACT | Status: AC | PRN
Start: 2021-08-27 — End: 2021-08-28
  Administered 2021-08-27: 19:00:00 1 via RESPIRATORY_TRACT

## 2021-08-27 MED ORDER — WARFARIN SODIUM 4 MG PO TABS
4 MG | ORAL_TABLET | Freq: Every day | ORAL | 0 refills | Status: DC
Start: 2021-08-27 — End: 2021-08-27

## 2021-08-27 MED ORDER — WARFARIN SODIUM 5 MG PO TABS
5 MG | Freq: Once | ORAL | Status: AC
Start: 2021-08-27 — End: 2021-08-27
  Administered 2021-08-27: 21:00:00 5 mg via ORAL

## 2021-08-27 MED ORDER — WARFARIN SODIUM 4 MG PO TABS
4 MG | ORAL_TABLET | Freq: Every day | ORAL | 0 refills | Status: DC
Start: 2021-08-27 — End: 2021-09-14

## 2021-08-27 MED ORDER — NICOTINE 14 MG/24HR TD PT24
14 MG/24HR | MEDICATED_PATCH | Freq: Every day | TRANSDERMAL | 0 refills | Status: DC
Start: 2021-08-27 — End: 2021-08-27

## 2021-08-27 MED ORDER — HYDROXYZINE HCL 25 MG PO TABS
25 MG | ORAL_TABLET | Freq: Three times a day (TID) | ORAL | 0 refills | Status: AC | PRN
Start: 2021-08-27 — End: 2021-09-06

## 2021-08-27 MED ORDER — OMEPRAZOLE 40 MG PO CPDR
40 MG | ORAL_CAPSULE | Freq: Every day | ORAL | 0 refills | Status: DC
Start: 2021-08-27 — End: 2022-05-08

## 2021-08-27 MED ORDER — METOPROLOL SUCCINATE ER 25 MG PO TB24
25 MG | ORAL_TABLET | Freq: Every day | ORAL | 0 refills | Status: AC
Start: 2021-08-27 — End: 2021-12-03

## 2021-08-27 MED ORDER — CITALOPRAM HYDROBROMIDE 20 MG PO TABS
20 MG | ORAL_TABLET | Freq: Every day | ORAL | 0 refills | Status: DC
Start: 2021-08-27 — End: 2022-05-23

## 2021-08-27 MED ORDER — LAMOTRIGINE 25 MG PO TABS
25 MG | ORAL_TABLET | Freq: Three times a day (TID) | ORAL | 0 refills | Status: AC
Start: 2021-08-27 — End: 2021-11-30

## 2021-08-27 MED ORDER — LEVOFLOXACIN 750 MG PO TABS
750 MG | ORAL_TABLET | Freq: Every day | ORAL | 0 refills | Status: AC
Start: 2021-08-27 — End: 2021-09-06

## 2021-08-27 MED ORDER — NICOTINE 14 MG/24HR TD PT24
1424 MG/24HR | MEDICATED_PATCH | Freq: Every day | TRANSDERMAL | 0 refills | Status: AC
Start: 2021-08-27 — End: 2021-11-30

## 2021-08-27 MED ORDER — IPRATROPIUM-ALBUTEROL 20-100 MCG/ACT IN AERS
20-100 MCG/ACT | Freq: Four times a day (QID) | RESPIRATORY_TRACT | 0 refills | Status: AC
Start: 2021-08-27 — End: 2021-12-03

## 2021-08-27 MED FILL — HYDROXYZINE HCL 25 MG PO TABS: 25 MG | ORAL | Qty: 1

## 2021-08-27 MED FILL — CITALOPRAM HYDROBROMIDE 20 MG PO TABS: 20 MG | ORAL | Qty: 1

## 2021-08-27 MED FILL — ACETAMINOPHEN 325 MG PO TABS: 325 MG | ORAL | Qty: 2

## 2021-08-27 MED FILL — IPRATROPIUM-ALBUTEROL 0.5-2.5 (3) MG/3ML IN SOLN: RESPIRATORY_TRACT | Qty: 3

## 2021-08-27 MED FILL — CETIRIZINE HCL 10 MG PO TABS: 10 MG | ORAL | Qty: 1

## 2021-08-27 MED FILL — WARFARIN SODIUM 5 MG PO TABS: 5 MG | ORAL | Qty: 1

## 2021-08-27 MED FILL — CEFTRIAXONE SODIUM 2 G IJ SOLR: 2 g | INTRAMUSCULAR | Qty: 2000

## 2021-08-27 MED FILL — PANTOPRAZOLE SODIUM 40 MG PO TBEC: 40 MG | ORAL | Qty: 1

## 2021-08-27 MED FILL — PREGABALIN 50 MG PO CAPS: 50 MG | ORAL | Qty: 1

## 2021-08-27 MED FILL — PRAVASTATIN SODIUM 20 MG PO TABS: 20 MG | ORAL | Qty: 2

## 2021-08-27 MED FILL — LAMOTRIGINE 25 MG PO TABS: 25 MG | ORAL | Qty: 1

## 2021-08-27 MED FILL — METOPROLOL SUCCINATE ER 25 MG PO TB24: 25 MG | ORAL | Qty: 1

## 2021-08-27 MED FILL — ASPIRIN LOW DOSE 81 MG PO CHEW: 81 MG | ORAL | Qty: 1

## 2021-08-27 MED FILL — K-PHOS-NEUTRAL 155-852-130 MG PO TABS: 155-852-130 MG | ORAL | Qty: 1

## 2021-08-27 MED FILL — NICOTINE 14 MG/24HR TD PT24: 14 MG/24HR | TRANSDERMAL | Qty: 1

## 2021-08-27 NOTE — Discharge Summary (Signed)
Discharge Summary        Patient ID:        Mary [name]        [id number]        56 y.o.        [birth year]        Admission Date: 08/23/2021      Discharge date and time: 08/27/2021        Inpatient care:   Problem that led to hospitalization: Principal Problem:    Sepsis (HCC)  Active Problems:    Asthma    History of mitral valve replacement with bioprosthetic valve    Anxiety disorder    Chronic obstructive pulmonary disease (HCC)    Bacteremia    Septic shock (HCC)  Resolved Problems:    * No resolved hospital problems. *     Key findings and test results:     08/23/21 CT Abd Pelvis:  1. Mildly edematous right kidney, with perirenal stranding. Trace left perirenal   stranding. Bilateral renal pelvis wall thickening and enhancement. Findings are   concerning for upper urinary tract infection/potentially very early   pyelonephritis (no striated renal enhancement at this time).   -Probable minimally complex cyst right kidney       2. Gallbladder is mildly distended, but no wall thickening or pericholecystic   fluid, and no stones visible by CT. Nonspecific.       3. Mild colonic diverticulosis.       4. Additional chronic findings as above.          08/23/21 Chest X-ray:  No significant change. Similar degree of vascular congestion.                   Consults:ID    Procedures:         Final diagnoses (primary and secondary): Sepsis (HCC)                                            Principal Problem:    Sepsis (HCC)  Active Problems:    Asthma    History of mitral valve replacement with bioprosthetic valve    Anxiety disorder    Chronic obstructive pulmonary disease (HCC)    Bacteremia    Septic shock (HCC)  Resolved Problems:    * No resolved hospital problems. *     Other Diagnoses at time of discharge  Principal Problem:    Sepsis (HCC)  Active Problems:    Asthma    History of mitral valve replacement with bioprosthetic valve    Anxiety disorder    Chronic obstructive pulmonary disease (HCC)    Bacteremia    Septic  shock (HCC)  Resolved Problems:    * No resolved hospital problems. *          Brief hospital course               Discharge Exam: BP (!) 142/87   Pulse 67   Temp 98.9 F (37.2 C) (Oral)   Resp 19   Ht 5\' 3"  (1.6 m)   Wt 204 lb (92.5 kg)   SpO2 94%   BMI 36.14 kg/m        Physical Exam:   General: Well appearing, NAD  Skin: No rash, no lesions  HEENT: Normal appearing oropharynx; no LAD, no lesions; EOMI  Head: Normocephalic, atraumatic  Cardiovascular: regular rate and rhythm, no obvious murmurs noted, no edema  Pulmonary: CTA bilaterally, no obvious wheezing, rales, or rhonchi  Abdomen: Bowel sounds normal, soft, non distended, non tender, no rebound or guarding.  No CVAT  MSK:  No deformities, spine non tender  Neuro: CN 2-12 intact, no focal deficits.   Psychiatric: normal mood, appropriate behavior, Alert and oriented x 3       Sepsis/Pyelonephritis/UTI/Ecoli Bacteremia  4/9 Patient found to have 4/4 positive blood cultures for E coli sensitive to Ceftriaxone, PCR detected enterobacteria  ID consulted, Dr. Almyra FreeLibby.  Started ceftriaxone, recommended transition to levaquin until 09/06/21 PO.  4/10 Repeat blood cultures no growth   4/10 rapid influenza a and B PCR: Negative  PT/OT evaluation no ongoing PT/OT needs - OT recommended shower chair, DME requested  Pt had noted difficulty with getting medications and scheduling transport, case management consulted to assist with having meds at bedside prior to discharge as well as arranging med transport to her follow up appointment     COPD  Continue home meds     Bipolar/Anxiety/PTSD  Citalopram 20 mg daily  Lamotrigine 25 mg q8h  Hydroxyzine prn        HFrEF  Furosemide 40 mg PRN home medication  Echo 08/20/21, shows diastolic dysfunction, EF 55-60%    Left Ventricle: Normal left ventricular systolic function with a visually estimated EF of 55 - 60%. Left ventricle is mildly dilated. Increased wall thickness. See diagram for wall motion findings. Diastolic  function present with increased LAP with normal LV EF.    Right Ventricle: Right ventricle is mildly dilated. RVIDd is 4.3 cm.    Aortic Valve: Moderate regurgitation with a centrally directed jet. Vena contracta measures 0.5 cm.    Mitral Valve: Bioprosthetic valve that is well-seated with normal leaflet motion. MV mean gradient is 7 mmHg. Moderate regurgitation.    Left Atrium: Left atrium is mildly dilated. Left atrial volume index is mildly increased (35-41 mL/m2). LA Vol Index A/L is 38 mL/m2.    Pulmonary Arteries: Pulmonary hypertension not present. The estimated PASP is 30 mmHg.  Continue coumadin for aortic valve replacement, pharmacy consult for dosing, goal INR per cardiology has been 2.0-2.5.  Patient advised to check INR daily and report to physician as levaquin may elevate INR.  Verbalized understanding and ability to do this from home.      Nicotine Dependence  Nicotine patch daily encouraged smoking cessation.  Right lower lobe pulmonary nodule on CT scan 01/21/21 - follow up outpatient            Postdischarge care/patient self-management          Medications at discharge  including reasons for change and indications for new ones:      Medication List        START taking these medications      levoFLOXacin 750 MG tablet  Commonly known as: Levaquin  Take 1 tablet by mouth daily for 10 days     nicotine 14 MG/24HR  Commonly known as: NICODERM CQ  Place 1 patch onto the skin daily            CHANGE how you take these medications      albuterol-ipratropium 20-100 MCG/ACT Aers inhaler  Commonly known as: COMBIVENT RESPIMAT  Inhale 1 puff into the lungs 4 times daily  What changed: Another medication with the same name was removed. Continue taking this medication, and follow the directions you see here.  hydrOXYzine HCl 25 MG tablet  Commonly known as: ATARAX  Take 1 tablet by mouth 3 times daily as needed for Anxiety  What changed:   when to take this  reasons to take this     lamoTRIgine 25 MG  tablet  Commonly known as: LAMICTAL  Take 1 tablet by mouth in the morning, at noon, and at bedtime  What changed:   how much to take  when to take this     vitamin B-12 1000 MCG tablet  Commonly known as: CYANOCOBALAMIN  What changed: Another medication with the same name was removed. Continue taking this medication, and follow the directions you see here.     warfarin 4 MG tablet  Commonly known as: COUMADIN  Take as directed. If you are unsure how to take this medication, talk to your nurse or doctor.  Original instructions: Take 1 tablet by mouth daily  What changed:   medication strength  how much to take            CONTINUE taking these medications      albuterol sulfate HFA 108 (90 Base) MCG/ACT inhaler  Commonly known as: PROVENTIL;VENTOLIN;PROAIR     aspirin 81 MG chewable tablet     cetirizine 10 MG tablet  Commonly known as: ZYRTEC     citalopram 20 MG tablet  Commonly known as: CELEXA  Take 1 tablet by mouth daily     docusate 100 MG Caps  Commonly known as: COLACE, DULCOLAX     fluticasone 50 MCG/ACT nasal spray  Commonly known as: FLONASE     furosemide 40 MG tablet  Commonly known as: LASIX     metoprolol succinate 25 MG extended release tablet  Commonly known as: TOPROL XL  Take 1 tablet by mouth daily     omeprazole 40 MG delayed release capsule  Commonly known as: PRILOSEC  Take 1 capsule by mouth daily     pravastatin 40 MG tablet  Commonly known as: PRAVACHOL  Take 1 tablet by mouth daily     pregabalin 200 MG capsule  Commonly known as: LYRICA  take 1 capsule by mouth three times a day            STOP taking these medications      cephALEXin 500 MG capsule  Commonly known as: KEFLEX     empagliflozin 10 MG tablet  Commonly known as: Jardiance     tiZANidine 2 MG tablet  Commonly known as: ZANAFLEX               Where to Get Your Medications        These medications were sent to [id number] [place], [place] - [address] - P [phone] Carmon Ginsberg [phone]  [address] [place],  [place] [address]      Phone: [phone]   albuterol-ipratropium 20-100 MCG/ACT Aers inhaler  citalopram 20 MG tablet  hydrOXYzine HCl 25 MG tablet  lamoTRIgine 25 MG tablet  levoFLOXacin 750 MG tablet  metoprolol succinate 25 MG extended release tablet  nicotine 14 MG/24HR  omeprazole 40 MG delayed release capsule  pravastatin 40 MG tablet  warfarin 4 MG tablet             Pending laboratory work and tests:  final repeat blood cultures from 08/23/21 (no growth 4 days at time of discharge)         Condition at discharge and functional status: Good  Diet at discharge regular diet        Activities at discharge: as tolerated        Discharge destination: home      Advanced care plan    Full code    Contact information/plan for follow up care      Follow-up with [facility] Dr. Reineke/Dr. Barrington Ellison in 1 week  Follow-up tests/labs:  cbc, cmp, mag

## 2021-08-27 NOTE — Progress Notes (Signed)
Patient meds sent to [facility]. Patient states meds she is unable to get meds financially and was told medications would be delivered. [facility] notified. Patient aware of situation. On call Case manager will be contacted in am

## 2021-08-27 NOTE — Care Coordination-Inpatient (Addendum)
08/27/21 1003   IMM Letter   IMM Letter given to Patient/Family/Significant other/Guardian/POA/by: CM deliveredt pt's  IMM Letter to pt at bedside   IMM Letter date given: 08/27/21   IMM Letter time given: 1003         .Important Message from Medicare" reviewed and explained with the patient Mary [name]  at bedside and signature was obtained. A signed copy provided to patient/representative. Original signed document placed in patient's chart.      Alycia Patten, MSW  Care Manager

## 2021-08-27 NOTE — Progress Notes (Signed)
[facility]  (A Division of [facility])      Progress Note      Date of Admission: 08/23/2021    Date of Note: 08/27/2021      Reason for Referral: GNR sepsis  Referring Physician: Thamas Jaegers, PA-C    Micro from this admission:   4/9 respiratory viral panel: negative  4/9 blood culture: 4 out of 4 gram-negative Pan-sensitive E.coli  4/9 urine cx: >100K E.coli- pan-sensitive  4/10 blood cultures:NGTD x2  4/10 blood cx: ngtd x 2  4/10 rapid influenza a and B PCR: Negative    Current Antimicrobials:    Prior Antimicrobials:  Ceftriaxone 4/9 present        Assessment:         E.coli  sepsis suspect GU origin: 4/9 UA with too numerous to count WBCs, 2+ bacteria, moderate leukocyte esterase, large blood; repeat UA on 6/10 with 6-8 WBCs 3+ bacteria   - 4/10 blood cx negative  Suspected pyelonephritis on the right : No evidence of obstruction or hydronephrosis noted on CT scan  Urinary tract infection- CT scan suggestive of early pyelonephritis ; no obstructive uropathy  Right lower lobe pulmonary nodule 3 mm  Bipolar disorder type I with anxiety  COPD  CHF  History of aortic valve replacement    Plan:   Transition to po levofloxacin 750 mg po daily for sepsis + UTI + pyelonephritis  -  stop date of 4/24  Follow-up 4/10 blood cultures- negative   Trend CBC, BMP    Ok for discharge from ID standpoint      Helyn Numbers, DO  [facility]  [address], [address], [place], [place] [address]  Office: [phone], Ext [phone]      Lines / Catheters:  peripheral      Subjective:   Patient seen and examined.  Almost back to her baseline. No new fevers documented. Occasiona wheezes. Hoping to go home today.     HPI:  Mary [name] is a 56 year old female with a history of CHF, COPD, anxiety with bipolar 1 disorder who presented to the emergency department yesterday evening secondary to fevers chills and generalized malaise.  She been having fevers since  Friday and had a Tmax of 101.7.  She had called EMS which brought her to the emergency department where she was noted to have hypotension with BPs of 85/55.  At that time she was diagnosed with a UTI given IV fluids and was discharged home on Keflex.  She was called back to the ER earlier this morning secondary to blood cultures being noted to be positive in 4 out of 4 bottles from her recent ED visit.  She continues to have ongoing fevers tachycardia and chills.  She has some right-sided flank pain.  She had been given a dose of ceftriaxone yesterday and is doing for a second dose of ceftriaxone this evening.  Overall she feels slightly better than she had yesterday although not too much.  No associated nausea vomiting or diarrhea.  Poor appetite           Objective:      BP (!) 142/87   Pulse 67   Temp 98.9 F (37.2 C) (Oral)   Resp 19   Ht 5\' 3"  (1.6 m)   Wt 204 lb (92.5 kg)   SpO2 94%   BMI 36.14 kg/m   Temp (24hrs), Avg:99 F (37.2 C), Min:98.7 F (37.1 C), Max:99.6 F (37.6  C)        General:   awake alert and oriented   Skin:   no rashes or skin lesions noted on limited exam   HEENT:  Normocephalic, atraumatic, PERRL, EOMI, no scleral icterus or pallor; no conjunctival hemmohage;    Lymph Nodes:   no cervical or inguinal adenopathy   Lungs:   non-labored, occasional wheezing bilaterally, diminished at bases   Heart:  RRR, s1 and s2; no murmurs rubs or gallops, no edema, + pedal pulses   Abdomen:  soft, non-distended, active bowel sounds. Non-tender   Genitourinary:  deferred   Extremities:   no clubbing, cyanosis; no joint effusions or swelling; Full ROM of all large joints to the upper and lower extremities; muscle mass appropriate for age   Neurologic:  No gross focal sensory abnormalities; equal muscle strength to upper and lower extremities. Speech appropirate. Cranial nerves intact   Psychiatric:  Anxious, pressured speech       Labs: Results:   Chemistry Recent Labs     08/25/21  0110  08/26/21  0428   NA 141 141   K 3.9 3.4*   CL 117* 111   CO2 19* 23   BUN 19* 11   GLOB  --  3.3      CBC w/Diff Recent Labs     08/24/21  1005 08/25/21  0110 08/26/21  0428   WBC 3.6* 6.8 6.4   RBC 3.54* 3.52* 3.48*   HGB 10.6* 10.5* 10.5*   HCT 32.0* 31.7* 31.8*   PLT 115* 135 174            No results found for: SDES No components found for: CULT     Results       Procedure Component Value Units Date/Time    Culture, MRSA, Screening [[id number]] Collected: 08/23/21 2330    Order Status: Canceled Specimen: Nares     Culture, Blood 1 [[id number]] Collected: 08/23/21 2230    Order Status: Completed Specimen: Blood Updated: 08/27/21 0710     Special Requests NO SPECIAL REQUESTS        Culture NO GROWTH 4 DAYS       Blood Culture 2 [[id number]] Collected: 08/23/21 2220    Order Status: Completed Specimen: Blood Updated: 08/27/21 0710     Special Requests NO SPECIAL REQUESTS        Culture NO GROWTH 4 DAYS       COVID-19 & Influenza Combo [[id number]] Collected: 08/23/21 2200    Order Status: Completed Specimen: Nasopharyngeal Updated: 08/23/21 2323     SARS-CoV-2, PCR Not detected        Comment: Not Detected results do not preclude SARS-CoV-2 infection and should not be used as the sole basis for patient management decisions.Results must be combined with clinical observations, patient history, and epidemiological information.        Rapid Influenza A By PCR Not detected        Rapid Influenza B By PCR Not detected        Comment: Testing was performed using cobas Liat SARS-CoV-2 and Influenza A/B nucleic acid assay.  This test is a multiplex Real-Time Reverse Transcriptase Polymerase Chain Reaction (RT-PCR) based in vitro diagnostic test intended for the qualitative detection of nucleic acids from SARS-CoV-2, Influenza A, and Influenza B in nasopharyngeal for use under the FDA's Emergency Use Authorization(EAU) only.       Fact sheet for Patients: SalonClasses.at  Fact sheet for Healthcare  Providers: https://www.fda.fov/media/142191/download  Culture, Blood 2 [[id number]] Collected: 08/23/21 1059    Order Status: Completed Specimen: Blood Updated: 08/27/21 0710     Special Requests NO SPECIAL REQUESTS        Culture NO GROWTH 4 DAYS       COVID-19 & Influenza Combo [[id number]] Collected: 08/23/21 1054    Order Status: Completed Specimen: Nasopharyngeal Updated: 08/23/21 1130     SARS-CoV-2, PCR Not detected        Comment: Not Detected results do not preclude SARS-CoV-2 infection and should not be used as the sole basis for patient management decisions.Results must be combined with clinical observations, patient history, and epidemiological information.        Rapid Influenza A By PCR Not detected        Rapid Influenza B By PCR Not detected        Comment: Testing was performed using cobas Liat SARS-CoV-2 and Influenza A/B nucleic acid assay.  This test is a multiplex Real-Time Reverse Transcriptase Polymerase Chain Reaction (RT-PCR) based in vitro diagnostic test intended for the qualitative detection of nucleic acids from SARS-CoV-2, Influenza A, and Influenza B in nasopharyngeal for use under the FDA's Emergency Use Authorization(EAU) only.       Fact sheet for Patients: SalonClasses.at  Fact sheet for Healthcare Providers: https://www.fda.fov/media/142191/download         Culture, Blood 1 [[id number]] Collected: 08/23/21 1039    Order Status: Completed Specimen: Blood Updated: 08/27/21 0710     Special Requests NO SPECIAL REQUESTS        Culture NO GROWTH 4 DAYS       Culture, Urine [[id number]]  (Abnormal)  (Susceptibility) Collected: 08/22/21 2005    Order Status: Completed Specimen: Urine, clean catch Updated: 08/26/21 0740     Special Requests NO SPECIAL REQUESTS        Colony count --        >100,000  COLONIES/mL       Culture Escherichia coli       Susceptibility        Escherichia coli      BACTERIAL SUSCEPTIBILITY PANEL MIC      amikacin <=2 ug/mL  Sensitive      ampicillin 4 ug/mL Sensitive      ampicillin-sulbactam <=2 ug/mL Sensitive      ceFAZolin <=4 ug/mL Sensitive      cefepime <=1 ug/mL Sensitive      cefOXitin <=4 ug/mL Sensitive      cefTAZidime <=1 ug/mL Sensitive      cefTRIAXone <=1 ug/mL Sensitive      ciprofloxacin <=0.25 ug/mL Sensitive      gentamicin <=1 ug/mL Sensitive      levofloxacin <=0.12 ug/mL Sensitive      meropenem <=0.25 ug/mL Sensitive      nitrofurantoin <=16 ug/mL Sensitive      piperacillin-tazobactam <=4 ug/mL Sensitive      tobramycin <=1 ug/mL Sensitive      trimethoprim-sulfamethoxazole <=20 ug/mL Sensitive                           Blood Culture 2 [[id number]]  (Abnormal) Collected: 08/22/21 1959    Order Status: Completed Specimen: Blood Updated: 08/25/21 0903     Special Requests NO SPECIAL REQUESTS        Gram stain       AEROBIC AND ANAEROBIC BOTTLES Gram negative rods  SMEAR CALLED TO AND CORRECTLY REPEATED BY: PA D POOLE. ED. 0820. 08/23/21 BY [id number]           Culture       Escherichia coli GROWING IN BOTH BOTTLES DRAWN No Site Indicated REFER TO [id number] FOR SENSITIVITIES          Culture, Blood, PCR ID Panel [[id number]] Collected: 08/22/21 1954    Order Status: Canceled     Culture, Blood 1 [[id number]]  (Abnormal)  (Susceptibility) Collected: 08/22/21 1953    Order Status: Completed Specimen: Blood Updated: 08/25/21 0902     Special Requests NO SPECIAL REQUESTS        Gram stain       AEROBIC AND ANAEROBIC BOTTLES Gram negative rods                  SMEAR CALLED TO AND CORRECTLY REPEATED BY: PA D POOLE, ED, 0820, 08/23/21 BY [id number]           Culture       Escherichia coli GROWING IN BOTH BOTTLES DRAWN SITE=RAC          Susceptibility        Escherichia coli      BACTERIAL SUSCEPTIBILITY PANEL MIC      amikacin <=2 ug/mL Sensitive      ampicillin 4 ug/mL Sensitive      ampicillin-sulbactam <=2 ug/mL Sensitive      ceFAZolin <=4 ug/mL Sensitive      cefepime <=1 ug/mL Sensitive      cefOXitin <=4 ug/mL  Sensitive      cefTAZidime <=1 ug/mL Sensitive      cefTRIAXone <=1 ug/mL Sensitive      ciprofloxacin <=0.25 ug/mL Sensitive      gentamicin <=1 ug/mL Sensitive      levofloxacin <=0.12 ug/mL Sensitive      meropenem <=0.25 ug/mL Sensitive      piperacillin-tazobactam <=4 ug/mL Sensitive      tobramycin <=1 ug/mL Sensitive      trimethoprim-sulfamethoxazole <=20 ug/mL Sensitive                           Culture, Blood, PCR ID Panel [[id number]]  (Abnormal) Collected: 08/22/21 1953    Order Status: Completed Specimen: Blood Updated: 08/23/21 1533     Accession Number [id number]     Enterococcus faecalis by PCR Not detected        Enterococcus faecium by PCR Not detected        Listeria monocytogenes by PCR Not detected        STAPHYLOCOCCUS Not detected        Staphylococcus Aureus Not detected        Staphylococcus epidermidis by PCR Not detected        Staphylococcus lugdunensis by PCR Not detected        STREPTOCOCCUS Not detected        Streptococcus agalactiae (Group B) Not detected        Strep pneumoniae Not detected        Strep pyogenes,(Grp. A) Not detected        Acinetobacter calcoac baumannii complex by PCR Not detected        Bacteroides fragilis by PCR Not detected        Enterobacteriaceae by PCR Detected        Enterobacter cloacae complex by PCR Not detected        Escherichia Coli Detected  Klebsiella aerogenes by PCR Not detected        Klebsiella oxytoca by PCR Not detected        Klebsiella pneumoniae group by PCR Not detected        Proteus by PCR Not detected        Salmonella species by PCR Not detected        Serratia marcescens by PCR Not detected        Haemophilus Influenzae by PCR Not detected        Neisseria meningitidis by PCR Not detected        Pseudomonas aeruginosa Not detected        Stenotrophomonas maltophilia by PCR Not detected        Candida albicans by PCR Not detected        Candida auris by PCR Not detected        Candida glabrata Not detected        Candida krusei  by PCR Not detected        Candida parapsilosis by PCR Not detected        Candida tropicalis by PCR Not detected        Cryptococcus neoformans/gattii by PCR Not detected        Resistant gene targets            Resistant gene ctx-m by PCR Not detected        Resistant gene imp by PCR Not detected        KPC (Carbapenem resistance gene) Not detected        Colistin Resistance mcr-1 gene by PCR Not detected        Resistant gene ndm by PCR Not detected        Resistant gene oxa-48-like by pcr Not detected        Resistant gene vim by PCR Not detected        [employer] test comment       False positive results may rarely occur. Correlate with clinical,epidemiologic, and other laboratory findings           Comment: Please see [facility] Interpretation Guide in EPIC Links       Respiratory Panel, Molecular, with COVID-19 (Restricted: peds pts or suitable admitted adults) [[id number]] Collected: 08/22/21 1940    Order Status: Completed Specimen: Nasopharyngeal Updated: 08/22/21 2118     Adenovirus by PCR Not detected        Coronavirus 229E by PCR Not detected        Coronavirus HKU1 by PCR Not detected        Coronavirus NL63 by PCR Not detected        Coronavirus OC43 by PCR Not detected        SARS-CoV-2, PCR Not detected        Human Metapneumovirus by PCR Not detected        Rhinovirus Enterovirus PCR Not detected        Influenza A by PCR Not detected        Influenza B PCR Not detected        Parainfluenza 1 PCR Not detected        Parainfluenza 2 PCR Not detected        Parainfluenza 3 PCR Not detected        Parainfluenza 4 PCR Not detected        Respiratory Syncytial Virus by PCR Not detected        Bordetella parapertussis by PCR Not  detected        Bordetella pertussis by PCR Not detected        Chlamydophila Pneumonia PCR Not detected        Mycoplasma pneumo by PCR Not detected                     Imaging:     All available imaging since presentation reviewed as per EPIC

## 2021-08-27 NOTE — Progress Notes (Signed)
[employer] at [phone] scheduled stretcher transport to patient's home ([address] [place], [place] [address]) with [facility]er and received confirmation number [id number].  Transport can span from 30 minutes to 3 hours.  Driver will call nurse's station when in route to facility. Informed Rodney Booze of transportation conversation and arrangements.

## 2021-08-27 NOTE — Progress Notes (Signed)
Patient does not have medications ( antibiotics to go home) they where sent to [facility]. Still awaiting transportation home.

## 2021-08-27 NOTE — Care Coordination-Inpatient (Signed)
..  D/C order noted for today. Orders reviewed. No needs identified at this time.  Medicaid Transport will transport home. CM remains available if needed.     Alycia Patten, MSW  Case Manager

## 2021-08-27 NOTE — Progress Notes (Signed)
@[email or web address] @   Progress Note    Patient: Mary [name] MRN: Tracie Harrier  SSN: [id number]    Date of Birth: 03-24-1966  Age: 56 y.o.  Sex: female      Admit Date: 08/23/2021    LOS: 4 days     Subjective:   Patient feels well today with no complaints.   Awaiting medications delivered to bedside prior to discharge.  Pt high risk for sepsis if does not take prescribed antibiotic.  Discussed with pt nurse cannot discharge without medications in hand.  Case management has been consulted to assist.  Medications were sent this am for meds to bed program with pt [facility].  resent to [facility] outpt pharmacy as well.        ROS:   Denies HA, nausea, vomit, chest pain, SOB, palpitation, fevers or chills. Mood is good.     --      HPI:  Mary [name] is a 56 y.o. Caucasian female with PMHx of anxiety, bipolar 1 disorder, HFpEF, COPD, Aortic valve replacement, and PTSD.      Patient presented to ED on 08/22/21 with fever, SOB, and cloudy urine. Patient states she started feeling poorly on Friday afternoon, by Saturday she started having a fever, chills, and noticed cloudy, malodorous urine with dysuria. Sunday she started feeling weak, dizzy and SOB which is what prompted her to call EMS. She denies nausea, vomiting, diarrhea at that time.      ED Course  Patient was seen on 08/22/2021 with WBC of 12.0.  Lactic acid 2.0 . UA with 2+ bacteria, moderate leukocyte esterase, too numerous to count WBC.  Patient was given IV ceftriaxone while in the ED and discharged on Keflex. The ED found 4/4 positive blood cultures for E coli and asked to return to the ED for treatment. Seen by Dr. 10/22/2021 ID who recommended IV Ceftriaxone and await sensitivity from blood cultures. Patient was having hypotension after given ativan dose for anxiety, also  tachycardia, fevers and eventually required pressors. She was admitted to ICU for E coli septicemia, pyelonephritis, and UTI.        08/23/21 CT Abd Pelvis:  1. Mildly edematous right  kidney, with perirenal stranding. Trace left perirenal   stranding. Bilateral renal pelvis wall thickening and enhancement. Findings are   concerning for upper urinary tract infection/potentially very early   pyelonephritis (no striated renal enhancement at this time).   -Probable minimally complex cyst right kidney       2. Gallbladder is mildly distended, but no wall thickening or pericholecystic   fluid, and no stones visible by CT. Nonspecific.       3. Mild colonic diverticulosis.       4. Additional chronic findings as above.          08/23/21 Chest X-ray:  No significant change. Similar degree of vascular congestion.      Objective:     Vitals:    08/27/21 0540 08/27/21 0715 08/27/21 1100 08/27/21 1500   BP:  (!) 142/87 139/72 (!) 151/72   Pulse:  67 65 53   Resp:  19 19 20    Temp:  98.9 F (37.2 C) 98.7 F (37.1 C) 98.6 F (37 C)   TempSrc:  Oral Oral Oral   SpO2:  94% 96% 95%   Weight: 204 lb (92.5 kg)      Height:            Intake and Output:  Current Shift: 04/14  0701 - 04/14 1900  In: 480 [P.O.:480]  Out: -   Last three shifts: 04/12 1901 - 04/14 0700  In: 910 [P.O.:900; I.V.:10]  Out: -             Physical Exam:   General: Well appearing, NAD  Skin: No rash, no lesions  HEENT: Normal appearing oropharynx; no LAD, no lesions; EOMI  Head: Normocephalic, atraumatic  Cardiovascular: regular rate and rhythm, no obvious murmurs noted, no edema  Pulmonary: CTA bilaterally, no obvious wheezing, rales, or rhonchi  Abdomen: Bowel sounds normal, soft, non distended, non tender, no rebound or guarding.   MSK:  No deformities, spine non tender  Neuro: CN 2-12 intact, no focal deficits.   Psychiatric: normal mood, appropriate behavior, Alert and oriented x 3    Lab/Data Review:  reviewed      Assessment:     Principal Problem:    Sepsis (HCC)  Active Problems:    Asthma    History of mitral valve replacement with bioprosthetic valve    Anxiety disorder    Chronic obstructive pulmonary disease (HCC)     Bacteremia    Septic shock (HCC)  Resolved Problems:    * No resolved hospital problems. *      Plan:     Sepsis/Pyelonephritis/UTI/Ecoli Bacteremia  4/9 Patient found to have 4/4 positive blood cultures for E coli sensitive to Ceftriaxone, PCR detected enterobacteria  ID following Dr. Almyra FreeLibby - rec transition to levaquin until 09/06/21  4/10 Repeat blood cultures no growth    Will continue Ceftriaxone in house  Lactic acid 1.1  Procalcitonin 3.70  4/10 rapid influenza a and B PCR: Negative  PT/OT evaluation  Discusseed with pt nurse earlier, informed pt had meds however this evening was told pt did not have at bedside.  Discussed with pt night nurse pt cannot leave without meds in hand.  Follow up CM consult for medication assistance.  Phos 2.1 will replete     COPD  Duoneb q6h PRN  Instructed patient to not use home inhaler while inpatient, she can ask for nebulizer as need for symptoms     Bipolar/Anxiety/PTSD  Citalopram 20 mg daily  Lamotrigine 25 mg q8h  Hydroxyzine prn        HFrEF  Furosemide 40 mg PRN home medication  Echo 08/20/21, shows diastolic dysfunction, EF 55-60%    Left Ventricle: Normal left ventricular systolic function with a visually estimated EF of 55 - 60%. Left ventricle is mildly dilated. Increased wall thickness. See diagram for wall motion findings. Diastolic function present with increased LAP with normal LV EF.    Right Ventricle: Right ventricle is mildly dilated. RVIDd is 4.3 cm.    Aortic Valve: Moderate regurgitation with a centrally directed jet. Vena contracta measures 0.5 cm.    Mitral Valve: Bioprosthetic valve that is well-seated with normal leaflet motion. MV mean gradient is 7 mmHg. Moderate regurgitation.    Left Atrium: Left atrium is mildly dilated. Left atrial volume index is mildly increased (35-41 mL/m2). LA Vol Index A/L is 38 mL/m2.    Pulmonary Arteries: Pulmonary hypertension not present. The estimated PASP is 30 mmHg.  Continue coumadin for aortic valve replacement,  pharmacy consult for dosing, goal INR per cardiology has been 2.0-2.5.       Nicotine Dependence  Nicotine patch daily  Right lower lobe pulmonary nodule on CT scan 01/21/21 - follow up outpatient        DVT/GI Prophylaxis: warfarin, Protonix  Signed By: Rowan Blase, MD     August 27, 2021

## 2021-08-27 NOTE — Progress Notes (Signed)
[facility] Dosing Services    Consult for Warfarin Dosing by Pharmacy by Dr. Sebastian Ache  Consult provided for this 56 y.o.  female , for indication of bioprosthetic Heart Valve Replacement  Day of Therapy: ongoing  Home dose: 5mg  daily  Dose to achieve an INR goal of 2.0 to 3.0    Warfarin 5mg  today    Significant drug interactions: None  Previous dose given 5mg  4/13  5mg  4/14   PT/INR Lab Results   Component Value Date/Time    INR 2.2 08/27/2021 01:16 AM    INR 2.2 04/07/2020 09:20 AM    INREXT 2.0 05/27/2021 12:00 AM       Platelets Lab Results   Component Value Date/Time    PLT 174 08/26/2021 04:28 AM      H/H Lab Results   Component Value Date/Time    HGB 10.5 08/26/2021 04:28 AM        Pharmacy to follow daily and will provide subsequent Warfarin dosing based on clinical status.     Signed Arriel Victor, RPH

## 2021-08-27 NOTE — Care Coordination-Inpatient (Signed)
.  Requested Case Management specialist to assist with transportation to.  Address is [address]  [place] [address] and phone number is [phone]   Patient will require ALS transport.   Pt requires Cab If stretcher, reason:Bacteremia [R78.81] Bacteremia, Pyelonephritis, Septicemia (HCC), Positive blood cultures, Acute cystitis with hematuria, Septic shock (HCC), Sepsis   Patient is currently requiring oxygen NO   Height:5'3   Weight: 204  Pt is on isolation: NO  Isolation is for: NA  Is the pt ready now? Yes  Requested time: Next Available  PCS Faxed: NA  Insurance verified on face sheet: Yes/ [employer] and Medicaid   Auth needed for transport: Yes   CM completed PCS/ Envelope and placed on chart.          Alycia Patten, MSW  Care Manager

## 2021-08-27 NOTE — Discharge Instructions (Addendum)
Patient Discharge Instructions    Mary [name] / [id number] DOB: 1965/09/07    Admitted 08/23/2021 Discharged: 08/27/2021     It is important that you take the medication exactly as they are prescribed.   Keep your medication in the bottles provided by the pharmacist and keep a list of the medication names, dosages, and times to be taken with you at all times.   Do not take other medications without consulting your doctor.     What to do at Home    Check INR daily and report values to your physician for instruction on titrating coumadin.  Your antibiotic may increase your coumadin levels.    Recommended Diet: regular diet    Recommended Activity: activity as tolerated    If you experience any of the following symptoms fever in next 48 hours, chest pain, vomiting and unable to tolerate antibiotics, please follow up with ER.    Signed By: Almyra Brace, MD     August 27, 2021         DISCHARGE SUMMARY from Nurse    PATIENT INSTRUCTIONS:    After general anesthesia or intravenous sedation, for 24 hours or while taking prescription Narcotics:  Limit your activities  Do not drive and operate hazardous machinery  Do not make important personal or business decisions  Do  not drink alcoholic beverages  If you have not urinated within 8 hours after discharge, please contact your surgeon on call.    Report the following to your surgeon:  Excessive pain, swelling, redness or odor of or around the surgical area  Temperature over 100.5  Nausea and vomiting lasting longer than 4 hours or if unable to take medications  Any signs of decreased circulation or nerve impairment to extremity: change in color, persistent  numbness, tingling, coldness or increase pain  Any questions    What to do at Home:  Recommended activity: activity as tolerated,     If you experience any of the following symptoms nausea, vomiting, diarrhea, shortness of breath, fever over 101, dizziness, fainting, change in mentation, or any other issues or  concerns, please follow up with primary care physician or dial 911 for emergencies.    *  Please give a list of your current medications to your Primary Care Provider.    *  Please update this list whenever your medications are discontinued, doses are      changed, or new medications (including over-the-counter products) are added.    *  Please carry medication information at all times in case of emergency situations.    These are general instructions for a healthy lifestyle:    No smoking/ No tobacco products/ Avoid exposure to second hand smoke  Surgeon General's Warning:  Quitting smoking now greatly reduces serious risk to your health.    Obesity, smoking, and sedentary lifestyle greatly increases your risk for illness    A healthy diet, regular physical exercise & weight monitoring are important for maintaining a healthy lifestyle    You may be retaining fluid if you have a history of heart failure or if you experience any of the following symptoms:  Weight gain of 3 pounds or more overnight or 5 pounds in a week, increased swelling in our hands or feet or shortness of breath while lying flat in bed.  Please call your doctor as soon as you notice any of these symptoms; do not wait until your next office visit.  The discharge information has been reviewed with the patient and caregiver.  The patient and caregiver verbalized understanding.  Discharge medications reviewed with the patient and caregiver and appropriate educational materials and side effects teaching were provided.  ___________________________________________________________________________________________________________________________________           levofloxacin (oral)  Pronunciation:  LEE voe FLOX a sin  Brand:  Levaquin  What is the most important information I should know about levofloxacin?  Levofloxacin can cause serious side effects, including tendon problems, nerve damage, serious mood or behavior changes, or low blood  sugar.  Stop using this medicine and call your doctor at once if you have symptoms such as: headache, hunger, irritability, numbness, tingling, burning pain, confusion, agitation, paranoia, problems with memory or concentration, thoughts of suicide, or sudden pain or movement problems in any of your joints.  In rare cases, levofloxacin may cause damage to your aorta, which could lead to dangerous bleeding or death. Get emergency medical help if you have severe and constant pain in your chest, stomach, or back.  What is levofloxacin?  Levofloxacin is a fluoroquinolone (flor-o-KWIN-o-lone) antibiotic that fights bacteria in the body. Levofloxacin is used to treat different types of bacterial infections. Levofloxacin is also used to treat people who have been exposed to anthrax or certain types of plague.  Fluoroquinolone antibiotics can cause serious or disabling side effects. Levofloxacin should be used only for infections that cannot be treated with a safer antibiotic.  Levofloxacin may also be used for purposes not listed in this medication guide.  What should I discuss with my healthcare provider before taking levofloxacin?  You should not use this medicine if you are allergic to levofloxacin or other fluoroquinolones (ciprofloxacin, gemifloxacin, moxifloxacin, norfloxacin, ofloxacin, and others).  Levofloxacin may cause swelling or tearing of a tendon (the fiber that connects bones to muscles in the body), especially in the Achilles' tendon of the heel. This can happen during treatment or up to several months after you stop taking levofloxacin. Tendon problems may be more likely in certain people (children and older adults, or people who use steroid medicine or have had an organ transplant).  Tell your doctor if you have ever had:  tendon problems, bone problems, arthritis or other joint problems (especially in children);  blood circulation problems, aneurysm, narrowing or hardening of the arteries;  heart  problems, high blood pressure;  a genetic disease such as Marfan syndrome or Ehler's-Danlos syndrome;  diabetes;  a muscle or nerve disorder, such as myasthenia gravis;  kidney disease;  seizures or epilepsy;  a head injury or brain tumor;  long QT syndrome (in you or a family member); or  low levels of potassium in your blood (hypokalemia).  Do not give this medicine to a child without medical advice.  It is not known whether this medicine will harm an unborn baby. Tell your doctor if you are pregnant.  You should not breast-feed while using this medicine.  How should I take levofloxacin?  Follow all directions on your prescription label and read all medication guides or instruction sheets. Use the medicine exactly as directed.  Take levofloxacin with water, at the same time each day. Drink extra fluids to keep your kidneys working properly while taking this medicine.  You may take levofloxacin tablets with or without food.  Take levofloxacin oral solution (liquid)  on an empty stomach, at least 1 hour before or 2 hours after a meal.  Measure liquid medicine carefully. Use the dosing syringe provided, or use  a medicine dose-measuring device (not a kitchen spoon).  Use this medicine for the full prescribed length of time, even if your symptoms quickly improve. Skipping doses can increase your risk of infection that is resistant to medication. Levofloxacin will not treat a viral infection such as the flu or a common cold.  Do not share levofloxacin with another person.  This medicine may affect a drug-screening urine test and you may have false results. Tell the laboratory staff that you use levofloxacin.  Store at room temperature away from moisture and heat. Keep the bottle tightly closed when not in use.  What happens if I miss a dose?  Take the medicine as soon as you can, but skip the missed dose if it is almost time for your next dose. Do not take two doses at one time.  What happens if I overdose?  Seek  emergency medical attention or call the Poison Help line at 1-4045083170.  What should I avoid while taking levofloxacin?  Avoid driving or hazardous activity until you know how this medicine will affect you. Your reactions could be impaired.  Antibiotic medicines can cause diarrhea, which may be a sign of a new infection. If you have diarrhea that is watery or bloody, call your doctor before using anti-diarrhea medicine.  Levofloxacin could make you sunburn more easily. Avoid sunlight or tanning beds. Wear protective clothing and use sunscreen (SPF 30 or higher) when you are outdoors. Tell your doctor if you have severe burning, redness, itching, rash, or swelling after being in the sun.  What are the possible side effects of levofloxacin?  Get emergency medical help if you have signs of an allergic reaction (hives, difficult breathing, swelling in your face or throat) or a severe skin reaction (fever, sore throat, burning in your eyes, skin pain, red or purple skin rash that spreads and causes blistering and peeling).  Levofloxacin can cause serious side effects, including tendon problems, side effects on your nerves (which may cause permanent nerve damage), serious mood or behavior changes (after just one dose), or low blood sugar (which can lead to coma).  Stop taking this medicine and call your doctor at once if you have:   low blood sugar --headache, hunger, sweating, irritability, dizziness, nausea, fast heart rate, or feeling anxious or shaky;  nerve symptoms in your hands, arms, legs, or feet --numbness, weakness, tingling, burning pain;  serious mood or behavior changes --nervousness, confusion, agitation, paranoia, hallucinations, memory problems, trouble concentrating, thoughts of suicide; or  signs of tendon rupture --sudden pain, swelling, bruising, tenderness, stiffness, movement problems, or a snapping or popping sound in any of your joints (rest the joint until you receive medical care or  instructions).  In rare cases, levofloxacin may cause damage to your aorta, the main blood artery of the body. This could lead to dangerous bleeding or death. Get emergency medical help if you have severe and constant pain in your chest, stomach, or back.  Stop taking levofloxacin and call your doctor at once if you have:  severe stomach pain, diarrhea that is watery or bloody;  fast or pounding heartbeats, fluttering in your chest, shortness of breath, and sudden dizziness (like you might pass out);  the first sign of any skin rash, no matter how mild;  muscle weakness, breathing problems;  seizure (convulsions);  increased pressure inside the skull --severe headaches, ringing in your ears, dizziness, nausea, vision problems, pain behind your eyes; or  liver problems --upper stomach pain, loss of appetite,  dark urine, clay-colored stools, jaundice (yellowing of the skin or eyes).  Common side effects may include:  nausea, constipation, diarrhea;  headache, dizziness; or  trouble sleeping.  This is not a complete list of side effects and others may occur. Call your doctor for medical advice about side effects. You may report side effects to FDA at 1-800-FDA-1088.  What other drugs will affect levofloxacin?  Some medicines can make levofloxacin much less effective when taken at the same time. If you take any of the following medicines, take your levofloxacin dose 2 hours before or 2 hours after you take the other medicine.  antacids that contain magnesium or aluminum (such as Maalox, Mylanta, or Rolaids), or the ulcer medicine sucralfate (Carafate);  didanosine (Videx) powder or chewable tablets; or  vitamin or mineral supplements that contain aluminum, iron, magnesium, or zinc.  Tell your doctor about all your other medicines, especially:  theophylline;  a diuretic or "water pill";  heart rhythm medication;  insulin or oral diabetes medicine (check your blood sugar regularly);  medicine to treat depression or mental  illness;  steroid medicine (such as prednisone);  a blood thinner --warfarin, Coumadin, Jantoven; or  NSAIDs (nonsteroidal anti-inflammatory drugs) --aspirin, ibuprofen (Advil, Motrin), naproxen (Aleve), celecoxib, diclofenac, indomethacin, meloxicam, and others.  This list is not complete. Other drugs may affect levofloxacin, including prescription and over-the-counter medicines, vitamins, and herbal products. Not all possible drug interactions are listed here.  Where can I get more information?  Your pharmacist can provide more information about levofloxacin.  Remember, keep this and all other medicines out of the reach of children, never share your medicines with others, and use this medication only for the indication prescribed.   Every effort has been made to ensure that the information provided by [facility] is accurate, up-to-date, and complete, but no guarantee is made to that effect. Drug information contained herein may be time sensitive. [employer] information has been compiled for use by healthcare practitioners and consumers in the [place] and therefore [employer] does not warrant that uses outside of the [place] are appropriate, unless specifically indicated otherwise. [employer]'s drug information does not endorse drugs, diagnose patients or recommend therapy. [employer]'s drug information is an Scientist, research (physical sciences) to assist licensed healthcare practitioners in caring for their patients and/or to serve consumers viewing this service as a supplement to, and not a substitute for, the expertise, skill, knowledge and judgment of healthcare practitioners. The absence of a warning for a given drug or drug combination in no way should be construed to indicate that the drug or drug combination is safe, effective or appropriate for any given patient. [employer] does not assume any responsibility for any aspect of healthcare administered with the aid of information [employer] provides. The  information contained herein is not intended to cover all possible uses, directions, precautions, warnings, drug interactions, allergic reactions, or adverse effects. If you have questions about the drugs you are taking, check with your doctor, nurse or pharmacist.  Copyright [phone] [employer] [place]: 14.01. Revision date: 05/22/2017.  Care instructions adapted under license by [facility]. If you have questions about a medical condition or this instruction, always ask your healthcare professional. [place] any warranty or liability for your use of this information.         nicotine (transdermal)  Pronunciation:  NIK oh teen  Brand:  Habitrol, Nicoderm C-Q, Nicotine System Kit  What is the most important information I should know about nicotine  transdermal?  Follow all directions on your medicine label and package. Tell each of your healthcare providers about all your medical conditions, allergies, and all medicines you use.  What is nicotine?  Nicotine is the primary ingredient in tobacco products.  Nicotine transdermal (skin patch) is a medical product used to help you stop smoking and help reduce nicotine withdrawal symptoms as you quit smoking.  Nicotine may also be used for purposes not listed in this medication guide.  What should I discuss with my healthcare provider before using nicotine transdermal?  Nicotine transdermal is not approved for use by anyone younger than 55 years old.  Ask a doctor or pharmacist if this medicine is safe to use if you have ever had:  heart disease, irregular heartbeats;  a heart attack or stroke;  untreated or uncontrolled high blood pressure;  blood circulation problems;  pheochromocytoma (tumor of the adrenal gland);  diabetes;  a thyroid disorder;  a stomach ulcer;  liver disease; or  if your skin is sensitive to adhesive tape or bandages.  Do not use nicotine transdermal without medical advice if you are pregnant.  Use effective birth  control, and tell your doctor if you become pregnant during treatment.  Smoking cigarettes during pregnancy can cause low birth weight, miscarriage, or stillbirth. Using a nicotine replacement product during pregnancy or while breast-feeding may be safer than smoking. However, you should try to stop smoking without using a nicotine replacement product if you are pregnant or breast-feeding. Talk with your doctor about the best way for you to stop smoking.  It may not be safe to breastfeed while using this medicine. Ask your doctor about any risk.  The nicotine transdermal patch may burn your skin if you wear the patch during an MRI (magnetic resonance imaging). Remove the patch before undergoing such a test.  How should I use nicotine transdermal?  Nicotine transdermal is only part of a complete program of treatment that may also include counseling, group support, and behavior changes. Your success will depend on your participation in all aspects of your smoking cessation program.  Use exactly as directed on the label, or as prescribed by your doctor. Start using the transdermal patch on the same day you stop (quit) smoking or using tobacco products.  Your patch strength and number of weeks of treatment will depend on how many cigarettes you smoked daily before quitting. Read and carefully follow any Instructions for Use provided with your medicine. Ask your doctor or pharmacist if you do not understand these instructions.  Wash your hands after applying or removing a nicotine skin patch.  Apply the patch to clean, dry, and hairless skin on your chest or the outer part of your upper arm. Press the patch firmly into place for about 10 seconds to make sure it sticks. You may leave the patch on while bathing, showering, or swimming.  Do not wear more than one nicotine patch at a time.  Never cut a skin patch.  Do not wear a nicotine patch at night if you have vivid dreams or trouble sleeping.  If a patch falls off, try  sticking it back into place. If it does not stick well, put on a new patch.  You may wear a Habitrol patch for 24 hours.  You may wear a Nicoderm CQ patch for 16 or 24 hours (wear for 24 hours if you crave cigarettes when you wake up in the morning).  Remove the skin patch after 24 hours and  replace it with a new one. Choose a different place on your body to wear the patch each time you put on a new one. Do not use the same skin area twice within 7 days.  After removing a skin patch fold it in half, sticky side in, and put it back into its pouch.  Do not use nicotine patches for longer than 8 weeks without the advice of your doctor.  Store at room temperature away from moisture and heat. Keep each patch in its foil pouch until you are ready to use it. Save the pouch so you can use it to throw away any used patches.  Keep both used and unused nicotine patches out of the reach of children or pets.  The amount of nicotine in a used or unused skin patch can be fatal to a child who accidentally sucks or chews on the patch. Seek emergency medical attention if this happens.  What happens if I miss a dose?  Apply a skin patch as soon as you remember. Do not wear a patch for longer than 24 hours. Do not use extra patches to make up the missed dose.  What happens if I overdose?  Seek emergency medical attention or call the Poison Help line at 1-705-063-0335.  Overdose symptoms may include severe dizziness, nausea, vomiting, diarrhea, weakness, and fast heart rate.  What should I avoid while using nicotine transdermal?  Avoid using lotions, oils, or moisturizing soaps on the skin where you plan to wear a nicotine transdermal patch, or it may not stick well.  What are the possible side effects of nicotine transdermal?  Get emergency medical help if you have signs of an allergic reaction: hives; difficult breathing; swelling of your face, lips, tongue, or throat.  Stop using nicotine transdermal and call your doctor at once if  you have:  fast or pounding heartbeats, fluttering in your chest;  extreme weakness or dizziness;  severe nausea and vomiting; or  redness, swelling, or skin rash where a nicotine patch was worn (especially if these symptoms do not clear up within 4 days after the patch was removed).  Common side effects may include:  dizziness;  sleep problems (insomnia), strange dreams;  dry mouth, upset stomach;  joint or muscle pain;  headache; or  mild skin irritation where the patch is worn.  This is not a complete list of side effects and others may occur. Call your doctor for medical advice about side effects. You may report side effects to FDA at 1-800-FDA-1088.  What other drugs will affect nicotine?  Other drugs may affect nicotine transdermal, including prescription and over-the-counter medicines, vitamins, and herbal products. Tell your doctor about all your current medicines and any medicine you start or stop using.  Where can I get more information?  Your pharmacist can provide more information about nicotine.  Remember, keep this and all other medicines out of the reach of children, never share your medicines with others, and use this medication only for the indication prescribed.   Every effort has been made to ensure that the information provided by [facility] is accurate, up-to-date, and complete, but no guarantee is made to that effect. Drug information contained herein may be time sensitive. [employer] information has been compiled for use by healthcare practitioners and consumers in the [place] and therefore [employer] does not warrant that uses outside of the [place] are appropriate, unless specifically indicated otherwise. [employer]'s drug information does not endorse drugs, diagnose patients  or recommend therapy. [employer]'s drug information is an Scientist, research (physical sciences) to assist licensed healthcare practitioners in caring for their patients and/or to serve consumers viewing this  service as a supplement to, and not a substitute for, the expertise, skill, knowledge and judgment of healthcare practitioners. The absence of a warning for a given drug or drug combination in no way should be construed to indicate that the drug or drug combination is safe, effective or appropriate for any given patient. [employer] does not assume any responsibility for any aspect of healthcare administered with the aid of information [employer] provides. The information contained herein is not intended to cover all possible uses, directions, precautions, warnings, drug interactions, allergic reactions, or adverse effects. If you have questions about the drugs you are taking, check with your doctor, nurse or pharmacist.  Copyright [phone] [employer] [place]. Version: 3.01. Revision date: 12/21/2017.  Care instructions adapted under license by [facility]. If you have questions about a medical condition or this instruction, always ask your healthcare professional. [place] any warranty or liability for your use of this information.    Patient {ARMBANDS:40628}          Sepsis: Care Instructions  Overview     Sepsis is a serious reaction to an infection. It causes inflammation across large areas of the body and can damage tissue and organs. It can lead to extremely low blood pressure. Infections that can lead to sepsis include:  A skin infection such as from a cut.  A lung infection like pneumonia.  A urinary tract infection.  A gut infection such as E. coli.  Sepsis is treated with antibiotics. Your doctor will try to find the infection that led to sepsis. You'll also get fluids through a vein (IV). Machines will track your vital signs, including temperature, blood pressure, breathing rate, and pulse rate.  The physical and mental effects of sepsis may not be seen for several weeks after treatment. And they may last long after the infection is gone.  Physical problems may include:  Feeling weak and  tired.  Feeling out of breath.  Aches and pains.  Problems with getting around.  Trouble falling asleep or staying asleep.  Dry and itchy skin, brittle nails, and hair loss.  Some of these effects can lead to problems with your organs or your feet, legs, hands, or arms.  Sepsis can also affect your mind and emotions. Problems may include:  Self-doubt.  Anxiety.  Nightmares.  Depression and mood problems.  Wanting to avoid other people.  Confusion.  Flashbacks and bad memories of your illness.  It's important to care for yourself and try to avoid infections. This may lower your risk of getting sepsis again.  Follow-up care is a key part of your treatment and safety. Be sure to make and go to all appointments, and call your doctor if you are having problems. It's also a good idea to know your test results and keep a list of the medicines you take.  How can you care for yourself at home?  Be safe with medicines. Take your medicines exactly as prescribed. Call your doctor if you think you are having a problem with your medicine.  If your doctor prescribed antibiotics, take them as directed. Do not stop taking them just because you feel better. You need to take the full course of antibiotics.  Help prevent future infections.  Avoid infections such as COVID-19, colds, and the flu. Wash your hands often. Stay up  to date on your COVID-19 vaccines. And get a flu vaccine every year.  Ask your doctor if you need a pneumococcal vaccine (to prevent pneumonia, meningitis, and other infections). If you have had one before, ask your doctor if you need another dose.  Clean any wounds or scrapes.  Drink plenty of fluids to prevent dehydration.  Eat a healthy diet. Include fruits, vegetables, and whole grains in your diet every day.  If your doctor recommends it, try doing some physical activity.  When should you call for help?   Call 911  anytime you think you may need emergency care. For example, call if:    You passed out (lost  consciousness).   Call your doctor now or seek immediate medical care if:    You have symptoms such as:  Shortness of breath.  Feeling very sick.  Severe pain.  A fast heart rate.  Cool, pale, or clammy skin.  Feeling confused.  Feeling very sleepy, or you are hard to wake up.     You are dizzy or lightheaded, or you feel like you may faint.     You have a fever or chills.   Watch closely for changes in your health, and be sure to contact your doctor if:    You do not get better as expected.   Where can you learn more?  Go to https://www.bennett.info/ and enter T383 to learn more about "Sepsis: Care Instructions."  Current as of: March 15, 2021               Content Version: 13.6   2006-2023 Healthwise, Incorporated.   Care instructions adapted under license by [facility]. If you have questions about a medical condition or this instruction, always ask your healthcare professional. [place] any warranty or liability for your use of this information.         Learning About COPD, Asthma, and Air Pollution  How does air pollution affect COPD and asthma?     When you have COPD or asthma, air pollution may make your symptoms worse. If it does, it means that air pollution is a trigger for you.  It is important to know what your triggers are and how to deal with them. If air pollution is a trigger for you, you need to learn about air quality and pay attention to weather forecasts that include how bad the air is expected to be.  How can you manage a flare-up caused by air pollution?  Don't panic. Quick treatment at home may help you prevent serious breathing problems.  Take your medicines exactly as your doctor tells you.  Use your quick-relief inhaler as directed by your doctor. If your symptoms don't get better after you use your medicine, have someone take you to the emergency room. Call an ambulance if necessary.  With inhaled medicines, a spacer or a nebulizer may help you get  more medicine to your lungs. Ask your doctor or pharmacist how to use them the right way. Practice using the spacer in front of a mirror before you have a flare-up. This may help you get the medicine into your lungs quickly.  If your doctor has given you steroid pills, take them as directed.  Talk to your doctor if you have any problems with your medicine.  How can you prevent flare-ups?  Try not to be outside when air pollution levels are high. Stay at home with your windows closed.  Don't smoke. This is the most  important step you can take to prevent more damage to your lungs and prevent problems. If you already smoke, it's never too late to stop. If you need help quitting, talk to your doctor about stop-smoking programs and medicines. These can increase your chances of quitting for good.  Avoid secondhand smoke.  Take your daily medicines as prescribed.  Follow-up care is a key part of your treatment and safety. Be sure to make and go to all appointments, and call your doctor if you are having problems. It's also a good idea to know your test results and keep a list of the medicines you take.  Where can you learn more?  Go to https://www.bennett.info/ and enter B312 to learn more about "Learning About COPD, Asthma, and Air Pollution."  Current as of: March 29, 2021               Content Version: 13.6   2006-2023 Healthwise, Incorporated.   Care instructions adapted under license by [facility]. If you have questions about a medical condition or this instruction, always ask your healthcare professional. [place] any warranty or liability for your use of this information.

## 2021-08-28 LAB — PROTIME-INR
INR: 2.7 — ABNORMAL HIGH (ref 0.8–1.2)
Protime: 29.2 s — ABNORMAL HIGH (ref 11.5–15.2)

## 2021-08-28 LAB — POCT GLUCOSE
POC Glucose: 122 mg/dL — ABNORMAL HIGH (ref 70–110)
POC Glucose: 99 mg/dL (ref 70–110)
POC Glucose: 97 mg/dL (ref 70–110)

## 2021-08-28 MED ORDER — WARFARIN SODIUM 2.5 MG PO TABS
2.5 MG | Freq: Once | ORAL | Status: DC
Start: 2021-08-28 — End: 2021-08-28

## 2021-08-28 MED FILL — LAMOTRIGINE 25 MG PO TABS: 25 MG | ORAL | Qty: 1

## 2021-08-28 MED FILL — CETIRIZINE HCL 10 MG PO TABS: 10 MG | ORAL | Qty: 1

## 2021-08-28 MED FILL — HYDROXYZINE HCL 25 MG PO TABS: 25 MG | ORAL | Qty: 1

## 2021-08-28 MED FILL — ACETAMINOPHEN 325 MG PO TABS: 325 MG | ORAL | Qty: 2

## 2021-08-28 MED FILL — PRAVASTATIN SODIUM 20 MG PO TABS: 20 MG | ORAL | Qty: 2

## 2021-08-28 MED FILL — METOPROLOL SUCCINATE ER 25 MG PO TB24: 25 MG | ORAL | Qty: 1

## 2021-08-28 MED FILL — PREGABALIN 50 MG PO CAPS: 50 MG | ORAL | Qty: 1

## 2021-08-28 MED FILL — PANTOPRAZOLE SODIUM 40 MG PO TBEC: 40 MG | ORAL | Qty: 1

## 2021-08-28 MED FILL — CEFTRIAXONE SODIUM 2 G IJ SOLR: 2 g | INTRAMUSCULAR | Qty: 2000

## 2021-08-28 MED FILL — ASPIRIN LOW DOSE 81 MG PO CHEW: 81 MG | ORAL | Qty: 1

## 2021-08-28 MED FILL — CITALOPRAM HYDROBROMIDE 20 MG PO TABS: 20 MG | ORAL | Qty: 1

## 2021-08-28 MED FILL — NICOTINE 14 MG/24HR TD PT24: 14 MG/24HR | TRANSDERMAL | Qty: 1

## 2021-08-28 MED FILL — TIZANIDINE HCL 2 MG PO TABS: 2 MG | ORAL | Qty: 1

## 2021-08-28 NOTE — Care Coordination-Inpatient (Addendum)
CM met with the patient at bedside. Patient stated she does not have any money to pay for her medication and does not have a ride home. This Probation officer asked the patient about family and friends assistance. The patient provided contact information for her Adrian Blackwater [phone] and asked this writer to contact him. Patient's primary nurse Jenny Reichmann was also at the bedside.    Patient does not qualify for indigent medications.     0946: Attempted to reach Hazeline Junker at the number provided by the patient. No answer, left a message requesting a call back.     7673: On call for Dr. Haywood Lasso office contacted regarding the patient's discharge. Awaiting call back.     4193: Update provided to Dr. Jacqulyn Liner. He will review and call CM back.    1117: CM paged. RN Delana Meyer stated the patient is requesting to speak with this Probation officer. This writer went to the bedside to speak with the patient. The patient stated she is ready to go home and her goddaughter and neighbors will help cover the cost of her medications and pick the medications up for her. Patient advised that I would inform Dr. Jacqulyn Liner. On call for Dr. Jacqulyn Liner contacted again.     1125: Spoke with Dr. Jacqulyn Liner and informed him the patient is requesting discharge and has resources to get medications. Dr. Jacqulyn Liner to visit patient prior to discharging. Patient and RN Jasmine updated.  Medicaid cab to be arranged for transport home.    Lanney Gins, BSN, RN  Case Management

## 2021-08-28 NOTE — Progress Notes (Signed)
[facility] Pharmacy Dosing Services    Consult for Warfarin Dosing by Pharmacy by Dr. Sebastian Ache  Consult provided for this 56 y.o.  female , for indication of bioprosthetic Heart Valve Replacement  Day of Therapy: ongoing  Home dose: 5mg  daily  Dose to achieve an INR goal of 2.0 to 3.0    Warfarin 2.5mg  today - discharge orders in    Significant drug interactions: None  Previous dose given 5mg  4/13  5mg  4/14   PT/INR Lab Results   Component Value Date/Time    INR 2.7 08/28/2021 02:26 AM    INR 2.2 04/07/2020 09:20 AM    INREXT 2.0 05/27/2021 12:00 AM       Platelets Lab Results   Component Value Date/Time    PLT 174 08/26/2021 04:28 AM      H/H Lab Results   Component Value Date/Time    HGB 10.5 08/26/2021 04:28 AM        Pharmacy to follow daily and will provide subsequent Warfarin dosing based on clinical status.     Signed 07/25/2021, [facility]

## 2021-08-28 NOTE — Care Coordination-Inpatient (Addendum)
Discharge order noted for today. Pt has been accepted to [facility] agency for [facility] for COPD. Met with patient and she is agreeable to the transition plan today. Transport has been arranged through [employer]. Patient's discharge summary and home health orders have been forwarded to [facility] home health agency via [facility]. Referral placed in queue and Ivin Booty with [facility] notified. Updated bedside RN, Jasmin, to the transition plan.  Discharge information has been documented on the AVS.       Lanney Gins, BSN, RN  Case Management

## 2021-08-28 NOTE — Care Coordination-Inpatient (Addendum)
CM spoke with Dr. Barrington Ellison and patient can discharge to home. Medicaid transportation arranged with [facility] at phone number [phone]. Reference number [id number]. Pickup time frame is 30 minutes to 3 hours. Primary nurse Jasmin updated.     1311: Received a call from the unit that [employer] contacted the patient and she missed the call. [facility] contacted by this Clinical research associate and another ticket was entered. Per Morrie Sheldon with [facility], the system reported the patient had been picked up and dropped off at the destination. Informed her the patient was still in the hospital room per unit staff. Second trip # [phone]. [facility] unable to provide pickup time and requested this writer call back in 15 minutes.     Abel Presto, BSN, RN  Case Management

## 2021-08-28 NOTE — Care Coordination-Inpatient (Signed)
No Indigent meds for patient.   Patient has [employer] and [employer] Medicaid.   Indigent Meds are not allowed for patients that has insurance.   Stretcher transport not needed for patient that is ambulatory.   Case discussed with Burt Knack Interim Manager of CM.           Dola Factor, RN  Case Management [phone]

## 2021-08-28 NOTE — Discharge Summary (Signed)
Discharge Summary        Patient ID:        Mary [name]        [id number]        56 y.o.        May 23, 1965        Admission Date: 08/23/2021      Discharge date and time: 08/28/2021        Inpatient care:   Problem that led to hospitalization: Principal Problem:    Sepsis (HCC)  Active Problems:    Asthma    History of mitral valve replacement with bioprosthetic valve    Anxiety disorder    Chronic obstructive pulmonary disease (HCC)    Bacteremia    Septic shock (HCC)  Resolved Problems:    * No resolved hospital problems. *     Key findings and test results:     08/23/21 CT Abd Pelvis:  1. Mildly edematous right kidney, with perirenal stranding. Trace left perirenal   stranding. Bilateral renal pelvis wall thickening and enhancement. Findings are   concerning for upper urinary tract infection/potentially very early   pyelonephritis (no striated renal enhancement at this time).   -Probable minimally complex cyst right kidney       2. Gallbladder is mildly distended, but no wall thickening or pericholecystic   fluid, and no stones visible by CT. Nonspecific.       3. Mild colonic diverticulosis.       4. Additional chronic findings as above.          08/23/21 Chest X-ray:  No significant change. Similar degree of vascular congestion.                   Consults:ID    Procedures:         Final diagnoses (primary and secondary): Sepsis (HCC)                                            Principal Problem:    Sepsis (HCC)  Active Problems:    Asthma    History of mitral valve replacement with bioprosthetic valve    Anxiety disorder    Chronic obstructive pulmonary disease (HCC)    Bacteremia    Septic shock (HCC)  Resolved Problems:    * No resolved hospital problems. *     Other Diagnoses at time of discharge  Principal Problem:    Sepsis (HCC)  Active Problems:    Asthma    History of mitral valve replacement with bioprosthetic valve    Anxiety disorder    Chronic obstructive pulmonary disease (HCC)    Bacteremia    Septic  shock (HCC)  Resolved Problems:    * No resolved hospital problems. *          Brief hospital course               Discharge Exam: BP (!) 151/78   Pulse 57   Temp 98.6 F (37 C) (Oral)   Resp 20   Ht 5\' 3"  (1.6 m)   Wt 201 lb 12.8 oz (91.5 kg)   SpO2 97%   BMI 35.75 kg/m        Physical Exam:   General: Well appearing, NAD  Skin: No rash, no lesions  HEENT: Normal appearing oropharynx; no LAD, no lesions; EOMI  Head: Normocephalic,  atraumatic  Cardiovascular: regular rate and rhythm, no obvious murmurs noted, no edema  Pulmonary: CTA bilaterally, no obvious wheezing, rales, or rhonchi  Abdomen: Bowel sounds normal, soft, non distended, non tender, no rebound or guarding.  No CVAT  MSK:  No deformities, spine non tender  Neuro: CN 2-12 intact, no focal deficits.   Psychiatric: normal mood, appropriate behavior, Alert and oriented x 3       Sepsis/Pyelonephritis/UTI/Ecoli Bacteremia  4/9 Patient found to have 4/4 positive blood cultures for E coli sensitive to Ceftriaxone, PCR detected enterobacteria  ID consulted, Dr. Almyra Free.  Started ceftriaxone, recommended transition to levaquin until 09/06/21 PO.  4/10 Repeat blood cultures no growth   4/10 rapid influenza a and B PCR: Negative  PT/OT evaluation no ongoing PT/OT needs - OT recommended shower chair, DME requested  Pt had noted difficulty with getting medications and scheduling transport, case management consulted to assist with having meds at bedside prior to discharge as well as arranging med transport to her follow up appointment     COPD  Continue home meds     Bipolar/Anxiety/PTSD  Citalopram 20 mg daily  Lamotrigine 25 mg q8h  Hydroxyzine prn        HFrEF  Furosemide 40 mg PRN home medication  Echo 08/20/21, shows diastolic dysfunction, EF 55-60%    Left Ventricle: Normal left ventricular systolic function with a visually estimated EF of 55 - 60%. Left ventricle is mildly dilated. Increased wall thickness. See diagram for wall motion findings. Diastolic  function present with increased LAP with normal LV EF.    Right Ventricle: Right ventricle is mildly dilated. RVIDd is 4.3 cm.    Aortic Valve: Moderate regurgitation with a centrally directed jet. Vena contracta measures 0.5 cm.    Mitral Valve: Bioprosthetic valve that is well-seated with normal leaflet motion. MV mean gradient is 7 mmHg. Moderate regurgitation.    Left Atrium: Left atrium is mildly dilated. Left atrial volume index is mildly increased (35-41 mL/m2). LA Vol Index A/L is 38 mL/m2.    Pulmonary Arteries: Pulmonary hypertension not present. The estimated PASP is 30 mmHg.  Continue coumadin for aortic valve replacement, pharmacy consult for dosing, goal INR per cardiology has been 2.0-2.5.  Patient advised to check INR daily and report to physician as levaquin may elevate INR.  Verbalized understanding and ability to do this from home.      Nicotine Dependence  Nicotine patch daily encouraged smoking cessation.  Right lower lobe pulmonary nodule on CT scan 01/21/21 - follow up outpatient            Postdischarge care/patient self-management          Medications at discharge  including reasons for change and indications for new ones:      Medication List        START taking these medications      levoFLOXacin 750 MG tablet  Commonly known as: Levaquin  Take 1 tablet by mouth daily for 10 days     nicotine 14 MG/24HR  Commonly known as: NICODERM CQ  Place 1 patch onto the skin daily            CHANGE how you take these medications      albuterol-ipratropium 20-100 MCG/ACT Aers inhaler  Commonly known as: COMBIVENT RESPIMAT  Inhale 1 puff into the lungs 4 times daily  What changed: Another medication with the same name was removed. Continue taking this medication, and follow the directions you see here.  hydrOXYzine HCl 25 MG tablet  Commonly known as: ATARAX  Take 1 tablet by mouth 3 times daily as needed for Anxiety  What changed:   when to take this  reasons to take this     lamoTRIgine 25 MG  tablet  Commonly known as: LAMICTAL  Take 1 tablet by mouth in the morning, at noon, and at bedtime  What changed:   how much to take  when to take this     vitamin B-12 1000 MCG tablet  Commonly known as: CYANOCOBALAMIN  What changed: Another medication with the same name was removed. Continue taking this medication, and follow the directions you see here.     warfarin 4 MG tablet  Commonly known as: COUMADIN  Take as directed. If you are unsure how to take this medication, talk to your nurse or doctor.  Original instructions: Take 1 tablet by mouth daily  What changed:   medication strength  how much to take            CONTINUE taking these medications      albuterol sulfate HFA 108 (90 Base) MCG/ACT inhaler  Commonly known as: PROVENTIL;VENTOLIN;PROAIR     aspirin 81 MG chewable tablet     cetirizine 10 MG tablet  Commonly known as: ZYRTEC     citalopram 20 MG tablet  Commonly known as: CELEXA  Take 1 tablet by mouth daily     docusate 100 MG Caps  Commonly known as: COLACE, DULCOLAX     fluticasone 50 MCG/ACT nasal spray  Commonly known as: FLONASE     furosemide 40 MG tablet  Commonly known as: LASIX     metoprolol succinate 25 MG extended release tablet  Commonly known as: TOPROL XL  Take 1 tablet by mouth daily     omeprazole 40 MG delayed release capsule  Commonly known as: PRILOSEC  Take 1 capsule by mouth daily     pravastatin 40 MG tablet  Commonly known as: PRAVACHOL  Take 1 tablet by mouth daily     pregabalin 200 MG capsule  Commonly known as: LYRICA  take 1 capsule by mouth three times a day            STOP taking these medications      cephALEXin 500 MG capsule  Commonly known as: KEFLEX     empagliflozin 10 MG tablet  Commonly known as: Jardiance     tiZANidine 2 MG tablet  Commonly known as: ZANAFLEX               Where to Get Your Medications        These medications [facility] [place] - [address] - P [phone] - F [phone]  [address],  [place] [id number]      Phone: [phone]   hydrOXYzine HCl 25 MG tablet  lamoTRIgine 25 MG tablet  levoFLOXacin 750 MG tablet  nicotine 14 MG/24HR  warfarin 4 MG tablet       These medications were sent to [facility] [id number] - [place], [place] - [address] - P [phone] Carmon Ginsberg- F [phone]  [address], [place] [id number]      Phone: [phone]   albuterol-ipratropium 20-100 MCG/ACT Aers inhaler  citalopram 20 MG tablet  metoprolol succinate 25 MG extended release tablet  omeprazole 40 MG delayed release capsule  pravastatin 40 MG tablet  Pending laboratory work and tests:  final repeat blood cultures from 08/23/21 (no growth 4 days at time of discharge)         Condition at discharge and functional status: Good        Diet at discharge regular diet        Activities at discharge: as tolerated        Discharge destination: Home      Advanced care plan    Full code    Contact information/plan for follow up care      Follow-up with [facility] Dr. Reineke/Dr. Barrington Ellison in 1 week  Follow-up tests/labs:  cbc, cmp, mag  levofloxacin  po daily for ecoli bacteremia with pyelonephritis with end date 09/06/2021

## 2021-08-29 LAB — CULTURE, BLOOD 1
Culture: NO GROWTH
Culture: NO GROWTH

## 2021-08-29 LAB — CULTURE, BLOOD 2
Culture: NO GROWTH
Culture: NO GROWTH

## 2021-08-31 ENCOUNTER — Inpatient Hospital Stay
Admit: 2021-08-31 | Discharge: 2021-08-31 | Disposition: A | Discharge status: Home / Self Care | Payer: MEDICARE | Attending: Emergency Medicine

## 2021-08-31 DIAGNOSIS — M25662 Stiffness of left knee, not elsewhere classified: Secondary | ICD-10-CM

## 2021-08-31 LAB — COMPREHENSIVE METABOLIC PANEL
ALT: 20 U/L (ref 13–56)
AST: 21 U/L (ref 10–38)
Albumin/Globulin Ratio: 0.9 (ref 0.8–1.7)
Albumin: 2.8 g/dL — ABNORMAL LOW (ref 3.4–5.0)
Alk Phosphatase: 63 U/L (ref 45–117)
Anion Gap: 4 mmol/L (ref 3.0–18)
BUN: 11 MG/DL (ref 7.0–18)
Bun/Cre Ratio: 11 — ABNORMAL LOW (ref 12–20)
CO2: 27 mmol/L (ref 21–32)
Calcium: 8.7 MG/DL (ref 8.5–10.1)
Chloride: 109 mmol/L (ref 100–111)
Creatinine: 1.01 MG/DL (ref 0.6–1.3)
Est, Glom Filt Rate: >60 mL/min/{1.73_m2} (ref >60)
Globulin: 3.2 g/dL (ref 2.0–4.0)
Glucose: 92 mg/dL (ref 74–99)
Potassium: 3.9 mmol/L (ref 3.5–5.5)
Sodium: 140 mmol/L (ref 136–145)
Total Bilirubin: 0.3 MG/DL (ref 0.2–1.0)
Total Protein: 6 g/dL — ABNORMAL LOW (ref 6.4–8.2)

## 2021-08-31 LAB — EKG 12-LEAD
Atrial Rate: 71 {beats}/min
Diagnosis: NORMAL
P Axis: 95 degrees
P-R Interval: 138 ms
Q-T Interval: 416 ms
QRS Duration: 88 ms
QTc Calculation (Bazett): 452 ms
R Axis: 27 degrees
T Axis: 55 degrees
Ventricular Rate: 71 {beats}/min

## 2021-08-31 LAB — LIPASE: Lipase: 140 U/L (ref 73–393)

## 2021-08-31 LAB — URINALYSIS
Bilirubin Urine: NEGATIVE
Blood, Urine: NEGATIVE
Glucose, UA: NEGATIVE mg/dL
Ketones, Urine: NEGATIVE mg/dL
Leukocyte Esterase, Urine: NEGATIVE
Nitrite, Urine: NEGATIVE
Protein, UA: NEGATIVE mg/dL
Specific Gravity, UA: 1.005 (ref 1.005–1.030)
Urobilinogen, Urine: 0.2 EU/dL (ref 0.2–1.0)
pH, Urine: 7 (ref 5.0–8.0)

## 2021-08-31 LAB — CBC WITH AUTO DIFFERENTIAL
Absolute Immature Granulocyte: 0.1 10*3/uL — ABNORMAL HIGH (ref 0.00–0.04)
Basophils %: 1 % (ref 0–2)
Basophils Absolute: 0.1 10*3/uL (ref 0.0–0.1)
Eosinophils %: 1 % (ref 0–5)
Eosinophils Absolute: 0.1 10*3/uL (ref 0.0–0.4)
Hematocrit: 32.5 % — ABNORMAL LOW (ref 35.0–45.0)
Hemoglobin: 10.5 g/dL — ABNORMAL LOW (ref 12.0–16.0)
Immature Granulocytes: 1 % — ABNORMAL HIGH (ref 0.0–0.5)
Lymphocytes %: 24 % (ref 21–52)
Lymphocytes Absolute: 2.2 10*3/uL (ref 0.9–3.6)
MCH: 29.9 PG (ref 24.0–34.0)
MCHC: 32.3 g/dL (ref 31.0–37.0)
MCV: 92.6 FL (ref 78.0–100.0)
MPV: 10 FL (ref 9.2–11.8)
Monocytes %: 6 % (ref 3–10)
Monocytes Absolute: 0.5 10*3/uL (ref 0.05–1.2)
Neutrophils %: 67 % (ref 40–73)
Neutrophils Absolute: 6 10*3/uL (ref 1.8–8.0)
Nucleated RBCs: 0 PER 100 WBC
Platelets: 452 10*3/uL — ABNORMAL HIGH (ref 135–420)
RBC: 3.51 M/uL — ABNORMAL LOW (ref 4.20–5.30)
RDW: 15.1 % — ABNORMAL HIGH (ref 11.6–14.5)
WBC: 9.1 10*3/uL (ref 4.6–13.2)
nRBC: 0 10*3/uL (ref 0.00–0.01)

## 2021-08-31 LAB — MAGNESIUM: Magnesium: 1.9 mg/dL (ref 1.6–2.6)

## 2021-08-31 MED ORDER — HALOPERIDOL LACTATE 5 MG/ML IJ SOLN
5 MG/ML | INTRAMUSCULAR | Status: AC
Start: 2021-08-31 — End: 2021-08-31
  Administered 2021-08-31: 14:00:00 2 mg via INTRAMUSCULAR

## 2021-08-31 MED FILL — HALOPERIDOL LACTATE 5 MG/ML IJ SOLN: 5 MG/ML | INTRAMUSCULAR | Qty: 1

## 2021-08-31 NOTE — ED Triage Notes (Signed)
Patient was discharged from hospital 3 days ago. Admitted for 5 days as she states for blood infection from her urine. Here today because she is shaking and she thinks its from the Levaquin she is on

## 2021-08-31 NOTE — ED Provider Notes (Signed)
EMERGENCY DEPARTMENT HISTORY AND PHYSICAL EXAM    Date: 08/31/2021  Patient Name: Mary [name]    History of Presenting Illness     Chief Complaint   Patient presents with    Generalized Body Aches         History Provided By: Patient      Additional History (Context): Mary [name] is a 56 y.o. female with a history of bipolar disorder, PTSD and COPD who presents today for bilateral leg pain and stiffness.  Patient reports this began shortly after starting Levaquin for urinary tract infection.  Reports has had a couple dose history denies recent trauma or injury denies any leg swelling or skin color changes.      PCP: Mickle Plumb, MD    No current facility-administered medications for this encounter.     Current Outpatient Medications   Medication Sig Dispense Refill    citalopram (CELEXA) 20 MG tablet Take 1 tablet by mouth daily 30 tablet 0    pravastatin (PRAVACHOL) 40 MG tablet Take 1 tablet by mouth daily 30 tablet 0    metoprolol succinate (TOPROL XL) 25 MG extended release tablet Take 1 tablet by mouth daily 30 tablet 0    omeprazole (PRILOSEC) 40 MG delayed release capsule Take 1 capsule by mouth daily 30 capsule 0    albuterol-ipratropium (COMBIVENT RESPIMAT) 20-100 MCG/ACT AERS inhaler Inhale 1 puff into the lungs 4 times daily 1 each 0    hydrOXYzine HCl (ATARAX) 25 MG tablet Take 1 tablet by mouth 3 times daily as needed for Anxiety 30 tablet 0    warfarin (COUMADIN) 4 MG tablet Take 1 tablet by mouth daily 15 tablet 0    lamoTRIgine (LAMICTAL) 25 MG tablet Take 1 tablet by mouth in the morning, at noon, and at bedtime 90 tablet 0    levoFLOXacin (LEVAQUIN) 750 MG tablet Take 1 tablet by mouth daily for 10 days 10 tablet 0    nicotine (NICODERM CQ) 14 MG/24HR Place 1 patch onto the skin daily 30 patch 0    vitamin B-12 (CYANOCOBALAMIN) 1000 MCG tablet Take 1 tablet by mouth daily      pregabalin (LYRICA) 200 MG capsule take 1 capsule by mouth three times a day 90 capsule 2    albuterol  sulfate HFA (PROVENTIL;VENTOLIN;PROAIR) 108 (90 Base) MCG/ACT inhaler Inhale 2 puffs into the lungs every 6 hours as needed      aspirin 81 MG chewable tablet Take 1 tablet by mouth daily      cetirizine (ZYRTEC) 10 MG tablet Take 1 tablet by mouth daily      docusate (COLACE, DULCOLAX) 100 MG CAPS Col-Rite 100 mg capsule   take 1 capsule by mouth twice a day if needed for constipation      fluticasone (FLONASE) 50 MCG/ACT nasal spray 1 spray by nasal route daily      furosemide (LASIX) 40 MG tablet Take 1 tablet by mouth daily as needed         Past History     Past Medical History:  Past Medical History:   Diagnosis Date    Anxiety     Bipolar 1 disorder (HCC)     CHF (congestive heart failure) (HCC)     COPD (chronic obstructive pulmonary disease) (HCC)     COPD (chronic obstructive pulmonary disease) (HCC)     H/O aortic valve replacement     PTSD (post-traumatic stress disorder)        Past Surgical  History:  Past Surgical History:   Procedure Laterality Date    CARDIAC CATHETERIZATION      CARDIAC VALVE SURGERY      mitral valve replacement    HAND/FINGER SURGERY UNLISTED Right     I&D right middle finger       Family History:  Family History   Problem Relation Age of Onset    No Known Problems Mother        Social History:  Social History     Tobacco Use    Smoking status: Every Day     Packs/day: 1.00     Types: Cigarettes    Smokeless tobacco: Never   Vaping Use    Vaping Use: Never used   Substance Use Topics    Alcohol use: Never    Drug use: Yes     Types: Marijuana Sherrie Mustache)       Allergies:  Allergies   Allergen Reactions    Alprazolam Other (See Comments)     Gets suicidal    Diazepam Other (See Comments)     Gets suicidal    Tobacco     Adhesive Tape Rash    Lithium Nausea And Vomiting         Review of Systems   Review of Systems   Constitutional:  Negative for chills, diaphoresis and fever.   HENT:  Negative for congestion and sore throat.    Eyes:  Negative for discharge.   Respiratory:  Negative for  shortness of breath and wheezing.    Cardiovascular:  Negative for chest pain.   Gastrointestinal:  Negative for abdominal distention, diarrhea, nausea and vomiting.   Genitourinary:  Negative for dysuria and urgency.   Musculoskeletal:  Positive for myalgias. Negative for arthralgias.   Skin:  Negative for rash.   Neurological:  Negative for headaches.   Psychiatric/Behavioral:  Negative for behavioral problems. The patient is not nervous/anxious.    All other systems reviewed and are negative.  All Other Systems Negative  Physical Exam     Vitals:    08/31/21 0842 08/31/21 0915 08/31/21 1000   BP: 139/67 129/66 137/65   Pulse: 74 71 74   Resp: 13 21 20    Temp: 98.9 F (37.2 C)     TempSrc: Oral     SpO2: 97%  99%   Weight: 190 lb (86.2 kg)     Height: 5' 3"  (1.6 m)       Physical Exam  Constitutional:       General: She is not in acute distress.     Appearance: She is normal weight.   HENT:      Head: Atraumatic.      Nose: Nose normal.      Mouth/Throat:      Mouth: Mucous membranes are moist.   Eyes:      Extraocular Movements: Extraocular movements intact.      Pupils: Pupils are equal, round, and reactive to light.   Cardiovascular:      Rate and Rhythm: Normal rate.   Pulmonary:      Effort: Pulmonary effort is normal.      Breath sounds: Normal breath sounds.   Abdominal:      General: There is no distension.      Tenderness: There is no abdominal tenderness.   Musculoskeletal:         General: Normal range of motion.      Right upper arm: Normal.      Left  upper arm: Normal.      Right forearm: Normal.      Left forearm: Normal.      Cervical back: Normal range of motion.      Right lower leg: Normal.      Left lower leg: Normal.      Right ankle: Normal.      Left ankle: Normal.   Neurological:      General: No focal deficit present.      Mental Status: She is alert and oriented to person, place, and time. Mental status is at baseline.   Psychiatric:         Thought Content: Thought content normal.          Diagnostic Study Results     Labs -     Recent Results (from the past 12 hour(s))   CBC with Auto Differential    Collection Time: 08/31/21  8:50 AM   Result Value Ref Range    WBC 9.1 4.6 - 13.2 K/uL    RBC 3.51 (L) 4.20 - 5.30 M/uL    Hemoglobin 10.5 (L) 12.0 - 16.0 g/dL    Hematocrit 32.5 (L) 35.0 - 45.0 %    MCV 92.6 78.0 - 100.0 FL    MCH 29.9 24.0 - 34.0 PG    MCHC 32.3 31.0 - 37.0 g/dL    RDW 15.1 (H) 11.6 - 14.5 %    Platelets 452 (H) 135 - 420 K/uL    MPV 10.0 9.2 - 11.8 FL    Nucleated RBCs 0.0 0 PER 100 WBC    nRBC 0.00 0.00 - 0.01 K/uL    Seg Neutrophils 67 40 - 73 %    Lymphocytes 24 21 - 52 %    Monocytes 6 3 - 10 %    Eosinophils % 1 0 - 5 %    Basophils 1 0 - 2 %    Immature Granulocytes 1 (H) 0.0 - 0.5 %    Segs Absolute 6.0 1.8 - 8.0 K/UL    Absolute Lymph # 2.2 0.9 - 3.6 K/UL    Absolute Mono # 0.5 0.05 - 1.2 K/UL    Absolute Eos # 0.1 0.0 - 0.4 K/UL    Basophils Absolute 0.1 0.0 - 0.1 K/UL    Absolute Immature Granulocyte 0.1 (H) 0.00 - 0.04 K/UL    Differential Type AUTOMATED     CMP    Collection Time: 08/31/21  8:50 AM   Result Value Ref Range    Sodium 140 136 - 145 mmol/L    Potassium 3.9 3.5 - 5.5 mmol/L    Chloride 109 100 - 111 mmol/L    CO2 27 21 - 32 mmol/L    Anion Gap 4 3.0 - 18 mmol/L    Glucose 92 74 - 99 mg/dL    BUN 11 7.0 - 18 MG/DL    Creatinine 1.01 0.6 - 1.3 MG/DL    Bun/Cre Ratio 11 (L) 12 - 20      Est, Glom Filt Rate >60 >60 ml/min/1.36m    Calcium 8.7 8.5 - 10.1 MG/DL    Total Bilirubin 0.3 0.2 - 1.0 MG/DL    ALT 20 13 - 56 U/L    AST 21 10 - 38 U/L    Alk Phosphatase 63 45 - 117 U/L    Total Protein 6.0 (L) 6.4 - 8.2 g/dL    Albumin 2.8 (L) 3.4 - 5.0 g/dL    Globulin 3.2 2.0 - 4.0 g/dL  Albumin/Globulin Ratio 0.9 0.8 - 1.7     Lipase    Collection Time: 08/31/21  8:50 AM   Result Value Ref Range    Lipase 140 73 - 393 U/L   Magnesium    Collection Time: 08/31/21  8:50 AM   Result Value Ref Range    Magnesium 1.9 1.6 - 2.6 mg/dL   Urinalysis    Collection Time:  08/31/21  8:57 AM   Result Value Ref Range    Color, UA YELLOW      Appearance CLEAR      Specific Gravity, UA 1.005 1.005 - 1.030      pH, Urine 7.0 5.0 - 8.0      Protein, UA Negative NEG mg/dL    Glucose, UA Negative NEG mg/dL    Ketones, Urine Negative NEG mg/dL    Bilirubin Urine Negative NEG      Blood, Urine Negative NEG      Urobilinogen, Urine 0.2 0.2 - 1.0 EU/dL    Nitrite, Urine Negative NEG      Leukocyte Esterase, Urine Negative NEG     EKG 12 Lead    Collection Time: 08/31/21  9:10 AM   Result Value Ref Range    Ventricular Rate 71 BPM    Atrial Rate 71 BPM    P-R Interval 138 ms    QRS Duration 88 ms    Q-T Interval 416 ms    QTc Calculation (Bazett) 452 ms    P Axis 95 degrees    R Axis 27 degrees    T Axis 55 degrees    Diagnosis       Normal sinus rhythm  Nonspecific ST and T wave abnormality  Abnormal ECG  When compared with ECG of 23-Aug-2021 22:44,  No significant change was found         Radiologic Studies -   No orders to display           Medical Decision Making   I am the first provider for this patient.    I reviewed the vital signs, available nursing notes, past medical history, past surgical history, family history and social history.    Vital Signs-Reviewed the patient's vital signs.      Records Reviewed: Nursing Notes and Old Medical Records     Procedures: None   Procedures    Provider Notes (Medical Decision Making):     Differential: Injury/trauma, medication side effect, tendon rupture      Plan: We will order basic labs and UA.  Will order dose of Haldol per patient request.  Patient has an allergy to Ativan.        11:01 AM  Labs and UA are reassuring.  Discussed with patient she should stop taking her Levaquin as it may be a source for her leg pain and stiffness.  We will send urine for culture.  Patient reports she is feeling much better at this time and ready to go home.           MED RECONCILIATION:  No current facility-administered medications for this encounter.     Current  Outpatient Medications   Medication Sig    citalopram (CELEXA) 20 MG tablet Take 1 tablet by mouth daily    pravastatin (PRAVACHOL) 40 MG tablet Take 1 tablet by mouth daily    metoprolol succinate (TOPROL XL) 25 MG extended release tablet Take 1 tablet by mouth daily    omeprazole (PRILOSEC) 40 MG delayed release capsule  Take 1 capsule by mouth daily    albuterol-ipratropium (COMBIVENT RESPIMAT) 20-100 MCG/ACT AERS inhaler Inhale 1 puff into the lungs 4 times daily    hydrOXYzine HCl (ATARAX) 25 MG tablet Take 1 tablet by mouth 3 times daily as needed for Anxiety    warfarin (COUMADIN) 4 MG tablet Take 1 tablet by mouth daily    lamoTRIgine (LAMICTAL) 25 MG tablet Take 1 tablet by mouth in the morning, at noon, and at bedtime    levoFLOXacin (LEVAQUIN) 750 MG tablet Take 1 tablet by mouth daily for 10 days    nicotine (NICODERM CQ) 14 MG/24HR Place 1 patch onto the skin daily    vitamin B-12 (CYANOCOBALAMIN) 1000 MCG tablet Take 1 tablet by mouth daily    pregabalin (LYRICA) 200 MG capsule take 1 capsule by mouth three times a day    albuterol sulfate HFA (PROVENTIL;VENTOLIN;PROAIR) 108 (90 Base) MCG/ACT inhaler Inhale 2 puffs into the lungs every 6 hours as needed    aspirin 81 MG chewable tablet Take 1 tablet by mouth daily    cetirizine (ZYRTEC) 10 MG tablet Take 1 tablet by mouth daily    docusate (COLACE, DULCOLAX) 100 MG CAPS Col-Rite 100 mg capsule   take 1 capsule by mouth twice a day if needed for constipation    fluticasone (FLONASE) 50 MCG/ACT nasal spray 1 spray by nasal route daily    furosemide (LASIX) 40 MG tablet Take 1 tablet by mouth daily as needed       Disposition:  Home     DISCHARGE NOTE:   Pt has been reexamined. Patient has no new complaints, changes, or physical findings.  Care plan outlined and precautions discussed.  Results of workup were reviewed with the patient. All medications were reviewed with the patient. All of pt's questions and concerns were addressed. Patient was instructed  and agrees to follow up with PCP as well as to return to the ED upon further deterioration. Patient is ready to go home.           Medication List        ASK your doctor about these medications      albuterol sulfate HFA 108 (90 Base) MCG/ACT inhaler  Commonly known as: PROVENTIL;VENTOLIN;PROAIR     albuterol-ipratropium 20-100 MCG/ACT Aers inhaler  Commonly known as: COMBIVENT RESPIMAT  Inhale 1 puff into the lungs 4 times daily     aspirin 81 MG chewable tablet     cetirizine 10 MG tablet  Commonly known as: ZYRTEC     citalopram 20 MG tablet  Commonly known as: CELEXA  Take 1 tablet by mouth daily     docusate 100 MG Caps  Commonly known as: COLACE, DULCOLAX     fluticasone 50 MCG/ACT nasal spray  Commonly known as: FLONASE     furosemide 40 MG tablet  Commonly known as: LASIX     hydrOXYzine HCl 25 MG tablet  Commonly known as: ATARAX  Take 1 tablet by mouth 3 times daily as needed for Anxiety     lamoTRIgine 25 MG tablet  Commonly known as: LAMICTAL  Take 1 tablet by mouth in the morning, at noon, and at bedtime     levoFLOXacin 750 MG tablet  Commonly known as: Levaquin  Take 1 tablet by mouth daily for 10 days     metoprolol succinate 25 MG extended release tablet  Commonly known as: TOPROL XL  Take 1 tablet by mouth daily     nicotine  14 MG/24HR  Commonly known as: NICODERM CQ  Place 1 patch onto the skin daily     omeprazole 40 MG delayed release capsule  Commonly known as: PRILOSEC  Take 1 capsule by mouth daily     pravastatin 40 MG tablet  Commonly known as: PRAVACHOL  Take 1 tablet by mouth daily     pregabalin 200 MG capsule  Commonly known as: LYRICA  take 1 capsule by mouth three times a day     vitamin B-12 1000 MCG tablet  Commonly known as: CYANOCOBALAMIN     warfarin 4 MG tablet  Commonly known as: COUMADIN  Take as directed. If you are unsure how to take this medication, talk to your nurse or doctor.  Original instructions: Take 1 tablet by mouth daily                  Diagnosis     Clinical  Impression:   1. Joint stiffness of left lower leg    2. Joint stiffness of right lower leg          "Please note that this dictation was completed with Dragon, the computer voice recognition software. Quite often unanticipated grammatical, syntax, homophones, and other interpretive errors are inadvertently transcribed by the computer software. Please disregard these errors. Please excuse any errors that have escaped final proofreading."     Lucretia Kern, [place]  08/31/21 1149

## 2021-08-31 NOTE — ED Notes (Signed)
Patient is discharged home is awaiting transportation       Garey Ham, RN  08/31/21 1038

## 2021-09-01 LAB — CULTURE, URINE: Culture: NO GROWTH

## 2021-09-02 ENCOUNTER — Encounter: Admit: 2021-09-02 | Discharge: 2021-09-02 | Payer: MEDICARE

## 2021-09-03 LAB — PROTIME-INR: INR: 3.9

## 2021-09-03 NOTE — Patient Instructions (Addendum)
N/a unable to leave message/ voice mail not picking up

## 2021-09-06 LAB — PROTIME-INR: INR: 2.8

## 2021-09-06 NOTE — Patient Instructions (Signed)
Unable to reach patient 09/03/21 and 09/06/21 mailed letter

## 2021-09-14 LAB — PROTIME-INR: INR: 8

## 2021-09-14 MED ORDER — WARFARIN SODIUM 2.5 MG PO TABS
2.5 MG | ORAL_TABLET | Freq: Every day | ORAL | 1 refills | Status: AC
Start: 2021-09-14 — End: ?

## 2021-09-14 NOTE — Patient Instructions (Signed)
SWP

## 2021-09-17 LAB — PROTIME-INR: INR: 1.6

## 2021-09-17 NOTE — Patient Instructions (Signed)
swp

## 2021-09-23 ENCOUNTER — Encounter: Payer: MEDICARE | Attending: Surgery | Primary: Family Medicine

## 2021-09-24 ENCOUNTER — Ambulatory Visit
Admit: 2021-09-24 | Discharge: 2021-09-24 | Payer: MEDICARE | Attending: Cardiovascular Disease | Primary: Family Medicine

## 2021-09-24 DIAGNOSIS — R079 Chest pain, unspecified: Secondary | ICD-10-CM

## 2021-09-24 LAB — PROTIME-INR: INR: 2.4

## 2021-09-24 MED ORDER — NITROGLYCERIN 0.4 MG SL SUBL
0.4 MG | SUBLINGUAL | Status: AC | PRN
Start: 2021-09-24 — End: 2022-05-08

## 2021-09-24 MED ORDER — RANOLAZINE ER 500 MG PO TB12
500 MG | ORAL_TABLET | Freq: Two times a day (BID) | ORAL | 3 refills | Status: DC
Start: 2021-09-24 — End: 2022-03-24

## 2021-09-24 NOTE — Patient Instructions (Signed)
Learning About the Mediterranean Diet  What is the Mediterranean diet?     The Mediterranean diet is a style of eating rather than a diet plan. It features foods eaten in [place], [place], southern [place] and [place], and other countries along the [place] Sea. It emphasizes eating foods like fish, fruits, vegetables, beans, high-fiber breads and whole grains, nuts, and olive oil. This style of eating includes limited red meat, cheese, and sweets.  Why choose the Mediterranean diet?  A Mediterranean-style diet may improve heart health. It contains more fat than other heart-healthy diets. But the fats are mainly from nuts, unsaturated oils (such as fish oils and olive oil), and certain nut or seed oils (such as canola, soybean, or flaxseed oil). These fats may help protect the heart and blood vessels.  How can you get started on the Mediterranean diet?  Here are some things you can do to switch to a more Mediterranean way of eating.  What to eat  Eat a variety of fruits and vegetables each day, such as grapes, blueberries, tomatoes, broccoli, peppers, figs, olives, spinach, eggplant, beans, lentils, and chickpeas.  Eat a variety of whole-grain foods each day, such as oats, brown rice, and whole wheat bread, pasta, and couscous.  Eat fish at least 2 times a week. Try tuna, salmon, mackerel, lake trout, herring, or sardines.  Eat moderate amounts of low-fat dairy products, such as milk, cheese, or yogurt.  Eat moderate amounts of poultry and eggs.  Choose healthy (unsaturated) fats, such as nuts, olive oil, and certain nut or seed oils like canola, soybean, and flaxseed.  Limit unhealthy (saturated) fats, such as butter, palm oil, and coconut oil. And limit fats found in animal products, such as meat and dairy products made with whole milk. Try to eat red meat only a few times a month in very small amounts.  Limit sweets and desserts to only a few times a week. This includes sugar-sweetened drinks like soda.  The  Mediterranean diet may also include red wine with your meal--1 glass each day for women and up to 2 glasses a day for men.  Tips for eating at home  Use herbs, spices, garlic, lemon zest, and citrus juice instead of salt to add flavor to foods.  Add avocado slices to your sandwich instead of bacon.  Have fish for lunch or dinner instead of red meat. Brush the fish with olive oil, and broil or grill it.  Sprinkle your salad with seeds or nuts instead of cheese.  Cook with olive or canola oil instead of butter or oils that are high in saturated fat.  Switch from 2% milk or whole milk to 1% or fat-free milk.  Dip raw vegetables in a vinaigrette dressing or hummus instead of dips made from mayonnaise or sour cream.  Have a piece of fruit for dessert instead of a piece of cake. Try baked apples, or have some dried fruit.  Tips for eating out  Try broiled, grilled, baked, or poached fish instead of having it fried or breaded.  Ask your server to have your meals prepared with olive oil instead of butter.  Order dishes made with marinara sauce or sauces made from olive oil. Avoid sauces made from cream or mayonnaise.  Choose whole-grain breads, whole wheat pasta and pizza crust, brown rice, beans, and lentils.  Cut back on butter or margarine on bread. Instead, you can dip your bread in a small amount of olive oil.  Ask for a side   salad or grilled vegetables instead of french fries or chips.  Where can you learn more?  Go to https://www.healthwise.net/patientEd and enter O407 to learn more about "Learning About the Mediterranean Diet."  Current as of: Sep 21, 2020               Content Version: 13.5   2006-2022 [employer].   Care instructions adapted under license by [facility]. If you have questions about a medical condition or this instruction, always ask your healthcare professional. [employer] disclaims any warranty or liability for your use of this information.

## 2021-09-24 NOTE — Patient Instructions (Signed)
swp

## 2021-09-24 NOTE — Progress Notes (Signed)
Have you had Fatigue?  No    2.   Have you had have you had Chest Pain? Yes if so how long few days ago how frequent depends on situations . Is   it  substernal? no Pressure like? Yes  Comes on with Activity or with large meals? Unsure I    3.   Have you had Dyspnea (SOB) ? No can you walk 2 blocks or a flight of stairs without SOB no    4.   Have you had Orthopnea? No     5.   Have you had PND? Yes if so how long month how frequent once in the last week     6.   Have you had leg swelling? Yes if so how long other day how frequent unsure     7.    Have you had any weight gain? No     8. Have you had any palpitations? Yes if so how long unsure is more frequent now     9. Have you had any syncope? No     10. Do you have any wounds on legs? no

## 2021-09-24 NOTE — Progress Notes (Unsigned)
HISTORY OF PRESENT ILLNESS  Mary [name] is a 56 y.o. female.    PMH History of aortic valve replacement Bioprosthetic, CHF, Bipolar, Anxiety, Left atrial clip,   -----  CARDIAC STUDIES  -----  2d tte 08/06/2021    Left Ventricle: Normal left ventricular systolic function with a visually estimated EF of 55 - 60%. Left ventricle is mildly dilated. Increased wall thickness. See diagram for wall motion findings. Diastolic function present with increased LAP with normal LV EF.    Right Ventricle: Right ventricle is mildly dilated. RVIDd is 4.3 cm.    Aortic Valve: Moderate regurgitation with a centrally directed jet. Vena contracta measures 0.5 cm.    Mitral Valve: Bioprosthetic valve that is well-seated with normal leaflet motion. MV mean gradient is 7 mmHg. Moderate regurgitation.    Left Atrium: Left atrium is mildly dilated. Left atrial volume index is mildly increased (35-41 mL/m2). LA Vol Index A/L is 38 mL/m2.    Pulmonary Arteries: Pulmonary hypertension not present. The estimated PASP is 30 mmHg.  -----  LHC 04/2020  Mild single vessel coronary artery disease with preserved LV function.  Elevated LVEDP.  Recommend intense risk factor modification.  -----  Vascular duplex upper extremity venous left   Acute non-occlusive deep vein thrombosis in 1 of 2 brachial veins within the left upper extremity.   Superficial acute non-occlusive thrombosis in the cephalic and basilic veins within the left upper extremity.  ----  Nuclear stress 08/2021  Low Risk Stress Test  No significant resting or reversible defects present  Normal ejection fraction 74% without significant TID 0.88  SSS = 0, SRS = 2, SDS = -2  ----   Patient states that she gets upset and can get some chest discomfort, reports that trying to get to a psychiatrist.  Patient states that usually sharp.  Sometimes comes in the back but states that does not feel like muskuloskeletal.  There is a correlate with blood pressure elevation.  Reports that at least  mild with exertion sometimes but restricted with back brace.  reports some swelling in the left leg, easy bruiser.  Denies blood clots.  Denies PND, denies fatigue, reports some chronic shortness of breath with minimal exertion, denies orthopnea, denies palpitations     Family History   Problem Relation Age of Onset    No Known Problems Mother        Past Medical History:   Diagnosis Date    Anxiety     Bipolar 1 disorder (HCC)     CHF (congestive heart failure) (HCC)     COPD (chronic obstructive pulmonary disease) (HCC)     COPD (chronic obstructive pulmonary disease) (HCC)     H/O aortic valve replacement     PTSD (post-traumatic stress disorder)        Past Surgical History:   Procedure Laterality Date    CARDIAC CATHETERIZATION      CARDIAC VALVE SURGERY      mitral valve replacement    HAND/FINGER SURGERY UNLISTED Right     I&D right middle finger       Social History     Tobacco Use    Smoking status: Every Day     Packs/day: 1.00     Types: Cigarettes    Smokeless tobacco: Never   Substance Use Topics    Alcohol use: Never       Allergies   Allergen Reactions    Alprazolam Other (See Comments)     Gets  suicidal    Diazepam Other (See Comments)     Gets suicidal    Tobacco     Adhesive Tape Rash    Lithium Nausea And Vomiting       Prior to Admission medications    Medication Sig Start Date End Date Taking? Authorizing Provider   warfarin (COUMADIN) 2.5 MG tablet Take 1 tablet by mouth daily 09/14/21   Nicanor Bake, APRN - NP   citalopram (CELEXA) 20 MG tablet Take 1 tablet by mouth daily 08/27/21   Rowan Blase, MD   pravastatin (PRAVACHOL) 40 MG tablet Take 1 tablet by mouth daily 08/27/21   Rowan Blase, MD   metoprolol succinate (TOPROL XL) 25 MG extended release tablet Take 1 tablet by mouth daily 08/27/21   Rowan Blase, MD   omeprazole (PRILOSEC) 40 MG delayed release capsule Take 1 capsule by mouth daily 08/27/21   Rowan Blase, MD   albuterol-ipratropium  (COMBIVENT RESPIMAT) 20-100 MCG/ACT AERS inhaler Inhale 1 puff into the lungs 4 times daily 08/27/21   Rowan Blase, MD   lamoTRIgine (LAMICTAL) 25 MG tablet Take 1 tablet by mouth in the morning, at noon, and at bedtime 08/27/21   Rowan Blase, MD   nicotine (NICODERM CQ) 14 MG/24HR Place 1 patch onto the skin daily 08/27/21   Rowan Blase, MD   vitamin B-12 (CYANOCOBALAMIN) 1000 MCG tablet Take 1 tablet by mouth daily    Historical Provider, MD   pregabalin (LYRICA) 200 MG capsule take 1 capsule by mouth three times a day 07/21/21 07/22/11  Valetta Fuller, APRN - NP   albuterol sulfate HFA (PROVENTIL;VENTOLIN;PROAIR) 108 (90 Base) MCG/ACT inhaler Inhale 2 puffs into the lungs every 6 hours as needed 11/19/19   Ar Automatic Reconciliation   aspirin 81 MG chewable tablet Take 1 tablet by mouth daily    Ar Automatic Reconciliation   cetirizine (ZYRTEC) 10 MG tablet Take 1 tablet by mouth daily 10/23/19   Ar Automatic Reconciliation   docusate (COLACE, DULCOLAX) 100 MG CAPS Col-Rite 100 mg capsule   take 1 capsule by mouth twice a day if needed for constipation    Ar Automatic Reconciliation   fluticasone (FLONASE) 50 MCG/ACT nasal spray 1 spray by nasal route daily 05/09/20   Ar Automatic Reconciliation   furosemide (LASIX) 40 MG tablet Take 1 tablet by mouth daily as needed    Ar Automatic Reconciliation       No results found for: LIPIDPAN, BMP, CMP     There were no vitals taken for this visit.    HPI    Review of Systems      Physical Exam    ASSESSMENT and PLAN  Mary [name] has a reminder for a "due or due soon" health maintenance. I have asked that she contact her primary care provider for follow-up on this health maintenance.     HLD - Continue pravastatin 40mg  po daily, survey lipid panel periodically.       Atrial fibrillation - On coumadin, requests refill, Check for primary prevention of stroke.  Currently not eligible for Noac.       HTN - Normotensive today, metoprolol succinate 25mg   po daily,      Chronic heart failure preserved ejection fraction - Start jardiance 10mg  po daily, monitor fluid status, adjust as necessary, fluid restrication 2L/day, salt intake <2g per day.  Check 2D transthoracic echocardiogram evaluate for structural heart disease.  Currently on Lasix 40  mg p.o. daily as needed weight gain 2 to 3 pounds     Chest pain -  check nuclear chemical stress test intolerance to exercise evaluate for obstructive coronary artery disease.  Patient advised that if increasing frequency duration and intensity of discomfort or unrelieved discomfort to call EMS to visit emergency department.     Moderate aortic regurgitation  Mitral valve bioprosthetic with mean gradient 7 mmHg 08/02/2021

## 2021-09-27 ENCOUNTER — Ambulatory Visit: Admit: 2021-11-12 | Discharge: 2021-11-17 | Payer: MEDICARE | Primary: Family Medicine

## 2021-09-27 DIAGNOSIS — R079 Chest pain, unspecified: Secondary | ICD-10-CM

## 2021-10-02 ENCOUNTER — Emergency Department: Admit: 2021-10-02 | Payer: MEDICARE | Primary: Family Medicine

## 2021-10-02 ENCOUNTER — Inpatient Hospital Stay
Admit: 2021-10-02 | Discharge: 2021-10-03 | Disposition: A | Discharge status: Home / Self Care | Payer: MEDICARE | Attending: Emergency Medicine

## 2021-10-02 DIAGNOSIS — R079 Chest pain, unspecified: Secondary | ICD-10-CM

## 2021-10-02 DIAGNOSIS — R0789 Other chest pain: Secondary | ICD-10-CM

## 2021-10-02 LAB — TROPONIN: Troponin, High Sensitivity: 5 ng/L (ref 0–54)

## 2021-10-02 LAB — CBC WITH AUTO DIFFERENTIAL
Basophils %: 1 % (ref 0–2)
Basophils Absolute: 0.1 10*3/uL (ref 0.0–0.1)
Eosinophils %: 3 % (ref 0–5)
Eosinophils Absolute: 0.2 10*3/uL (ref 0.0–0.4)
Hematocrit: 39.4 % (ref 35.0–45.0)
Hemoglobin: 12.8 g/dL (ref 12.0–16.0)
Immature Granulocytes %: 0 % (ref 0.0–0.5)
Immature Granulocytes Absolute: 0 10*3/uL (ref 0.00–0.04)
Lymphocytes %: 52 % (ref 21–52)
Lymphocytes Absolute: 3.2 10*3/uL (ref 0.9–3.6)
MCH: 29.2 PG (ref 24.0–34.0)
MCHC: 32.5 g/dL (ref 31.0–37.0)
MCV: 89.7 FL (ref 78.0–100.0)
MPV: 10.6 FL (ref 9.2–11.8)
Monocytes %: 6 % (ref 3–10)
Monocytes Absolute: 0.4 10*3/uL (ref 0.05–1.2)
Neutrophils %: 37 % — ABNORMAL LOW (ref 40–73)
Neutrophils Absolute: 2.3 10*3/uL (ref 1.8–8.0)
Nucleated RBCs: 0 PER 100 WBC
Platelets: 224 10*3/uL (ref 135–420)
RBC: 4.39 M/uL (ref 4.20–5.30)
RDW: 14.1 % (ref 11.6–14.5)
WBC: 6.2 10*3/uL (ref 4.6–13.2)
nRBC: 0 10*3/uL (ref 0.00–0.01)

## 2021-10-02 LAB — PROTIME-INR
INR: 1.3 — ABNORMAL HIGH (ref 0.8–1.2)
Protime: 16.4 s — ABNORMAL HIGH (ref 11.5–15.2)

## 2021-10-02 LAB — LIPASE: Lipase: 102 U/L (ref 73–393)

## 2021-10-02 LAB — BASIC METABOLIC PANEL
Anion Gap: 3 mmol/L (ref 3.0–18)
BUN/Creatinine Ratio: 15 (ref 12–20)
BUN: 14 MG/DL (ref 7.0–18)
CO2: 27 mmol/L (ref 21–32)
Calcium: 8.7 MG/DL (ref 8.5–10.1)
Chloride: 110 mmol/L (ref 100–111)
Creatinine: 0.96 MG/DL (ref 0.6–1.3)
Est, Glom Filt Rate: >60 mL/min/{1.73_m2} (ref >60)
Glucose: 90 mg/dL (ref 74–99)
Potassium: 4.4 mmol/L (ref 3.5–5.5)
Sodium: 140 mmol/L (ref 136–145)

## 2021-10-02 LAB — BRAIN NATRIURETIC PEPTIDE: NT Pro-BNP: 174 PG/ML (ref 0–900)

## 2021-10-02 LAB — MAGNESIUM: Magnesium: 2.2 mg/dL (ref 1.6–2.6)

## 2021-10-02 MED ORDER — NICOTINE 14 MG/24HR TD PT24
14 MG/24HR | Freq: Every day | TRANSDERMAL | Status: DC
Start: 2021-10-02 — End: 2021-10-03
  Administered 2021-10-02: 22:00:00 1 via TRANSDERMAL

## 2021-10-02 MED ORDER — NITROGLYCERIN 2 % TD OINT
2 % | TRANSDERMAL | Status: AC
Start: 2021-10-02 — End: 2021-10-02
  Administered 2021-10-03: 01:00:00 0.5 [in_us] via TOPICAL

## 2021-10-02 MED ORDER — IOPAMIDOL 76 % IV SOLN
76 % | Freq: Once | INTRAVENOUS | Status: AC | PRN
Start: 2021-10-02 — End: 2021-10-02
  Administered 2021-10-02: 23:00:00 100 mL via INTRAVENOUS

## 2021-10-02 MED ORDER — NITROGLYCERIN 0.4 MG SL SUBL
0.4 MG | SUBLINGUAL | Status: DC | PRN
Start: 2021-10-02 — End: 2021-10-03
  Administered 2021-10-02: 22:00:00 0.4 mg via SUBLINGUAL

## 2021-10-02 MED FILL — ISOVUE-370 76 % IV SOLN: 76 % | INTRAVENOUS | Qty: 100

## 2021-10-02 MED FILL — NICOTINE 14 MG/24HR TD PT24: 14 MG/24HR | TRANSDERMAL | Qty: 1

## 2021-10-02 MED FILL — NITROGLYCERIN 0.4 MG SL SUBL: 0.4 MG | SUBLINGUAL | Qty: 1

## 2021-10-02 NOTE — ED Provider Notes (Shared)
[facility] EMERGENCY DEPT  EMERGENCY DEPARTMENT ENCOUNTER      Pt Name: Mary [name]  MRN: [id number]  Birthdate [birth year]  Date of evaluation: 10/02/2021  Provider: Arvella Merles, PA    CHIEF COMPLAINT     No chief complaint on file.        HISTORY OF PRESENT ILLNESS   (Location/Symptom, Timing/Onset, Context/Setting, Quality, Duration, Modifying Factors, Severity)  Note limiting factors.   Mary [name] is a 56 y.o. female who presents to the emergency department complaint of left-sided chest pain today.  She was at home watching television when the pain began.  Still rates it as a 7 out of 10, "enough to make her scared".  Multiple heart history procedures for valve replacement stent placement A-fib.  Used to smoke 3 packs a day but is down now to half a pack a day.  Smokes marijuana infrequently but occasionally.  Denies any recent alcohol.  On Coumadin she said she also took 4 baby aspirin today prior to arrival.    HPI    Nursing Notes were reviewed.    REVIEW OF SYSTEMS    (2-9 systems for level 4, 10 or more for level 5)     Review of Systems   Constitutional:  Negative for fever.   HENT: Negative.     Eyes:  Negative for visual disturbance.   Respiratory:  Negative for cough and shortness of breath.    Cardiovascular:  Positive for chest pain. Negative for palpitations.   Gastrointestinal: Negative.    Endocrine: Negative.    Genitourinary: Negative.    Musculoskeletal: Negative.    Skin: Negative.    Allergic/Immunologic: Negative.    Neurological:  Negative for headaches.   Hematological:  Bruises/bleeds easily.   Psychiatric/Behavioral: Negative.       Except as noted above the remainder of the review of systems was reviewed and negative.       PAST MEDICAL HISTORY     Past Medical History:   Diagnosis Date    Anxiety     Bipolar 1 disorder (HCC)     CHF (congestive heart failure) (HCC)     COPD (chronic obstructive pulmonary disease) (HCC)     DDD (degenerative disc disease), cervical     GERD (gastroesophageal  reflux disease)     H/O aortic valve replacement     History of transient ischemic attack (TIA)     Mitral regurgitation and mitral stenosis     PTSD (post-traumatic stress disorder)     Scoliosis     Vitamin D deficiency          SURGICAL HISTORY       Past Surgical History:   Procedure Laterality Date    CARDIAC CATHETERIZATION      CARDIAC VALVE SURGERY      mitral valve replacement    CESAREAN SECTION      HAND/FINGER SURGERY UNLISTED Right     I&D right middle finger    HYSTERECTOMY (CERVIX STATUS UNKNOWN)      KNEE SURGERY      OVARY REMOVAL           CURRENT MEDICATIONS       Previous Medications    ALBUTEROL SULFATE HFA (PROVENTIL;VENTOLIN;PROAIR) 108 (90 BASE) MCG/ACT INHALER    Inhale 2 puffs into the lungs every 6 hours as needed    ALBUTEROL-IPRATROPIUM (COMBIVENT RESPIMAT) 20-100 MCG/ACT AERS INHALER    Inhale 1 puff into the lungs 4 times daily    ASPIRIN  81 MG CHEWABLE TABLET    Take 1 tablet by mouth daily    CETIRIZINE (ZYRTEC) 10 MG TABLET    Take 1 tablet by mouth daily    CITALOPRAM (CELEXA) 20 MG TABLET    Take 1 tablet by mouth daily    DOCUSATE (COLACE, DULCOLAX) 100 MG CAPS    Col-Rite 100 mg capsule   take 1 capsule by mouth twice a day if needed for constipation    FLUTICASONE (FLONASE) 50 MCG/ACT NASAL SPRAY    1 spray by nasal route daily    FUROSEMIDE (LASIX) 40 MG TABLET    Take 1 tablet by mouth daily as needed    LAMOTRIGINE (LAMICTAL) 25 MG TABLET    Take 1 tablet by mouth in the morning, at noon, and at bedtime    LORAZEPAM (ATIVAN) 0.5 MG TABLET    Take 1 tablet by mouth every 6 hours as needed for Anxiety. Max Daily Amount: 2 mg    METOPROLOL SUCCINATE (TOPROL XL) 25 MG EXTENDED RELEASE TABLET    Take 1 tablet by mouth daily    NICOTINE (NICODERM CQ) 14 MG/24HR    Place 1 patch onto the skin daily    OMEPRAZOLE (PRILOSEC) 40 MG DELAYED RELEASE CAPSULE    Take 1 capsule by mouth daily    PRAVASTATIN (PRAVACHOL) 40 MG TABLET    Take 1 tablet by mouth daily    PREGABALIN (LYRICA) 200  MG CAPSULE    take 1 capsule by mouth three times a day    RANOLAZINE (RANEXA) 500 MG EXTENDED RELEASE TABLET    Take 1 tablet by mouth 2 times daily    VITAMIN B-12 (CYANOCOBALAMIN) 1000 MCG TABLET    Take 1 tablet by mouth daily    WARFARIN (COUMADIN) 2.5 MG TABLET    Take 1 tablet by mouth daily       ALLERGIES     Alprazolam, Diazepam, Levofloxacin, Tobacco, Adhesive tape, and Lithium    FAMILY HISTORY       Family History   Problem Relation Age of Onset    No Known Problems Mother           SOCIAL HISTORY       Social History     Socioeconomic History    Marital status: Divorced   Tobacco Use    Smoking status: Every Day     Packs/day: 1.00     Types: Cigarettes    Smokeless tobacco: Never   Vaping Use    Vaping Use: Never used   Substance and Sexual Activity    Alcohol use: Never    Drug use: Yes     Types: Marijuana (Weed)       SCREENINGS                               CIWA Assessment  BP: 135/61  Pulse: 56                 PHYSICAL EXAM    (up to 7 for level 4, 8 or more for level 5)     ED Triage Vitals [10/02/21 1652]   BP Temp Temp src Pulse Respirations SpO2 Height Weight - Scale   (!) 143/66 -- -- 58 16 98 %  (1.6 m) 181 lb (82.1 kg)       Physical Exam  Constitutional:       General: She is not in  acute distress.     Appearance: Normal appearance. She is normal weight. She is not toxic-appearing.   HENT:      Head: Normocephalic.      Nose: Nose normal.      Mouth/Throat:      Mouth: Mucous membranes are moist.   Eyes:      Extraocular Movements: Extraocular movements intact.   Cardiovascular:      Rate and Rhythm: Normal rate and regular rhythm.      Pulses: Normal pulses.      Heart sounds: Normal heart sounds.   Pulmonary:      Effort: Pulmonary effort is normal.      Breath sounds: Normal breath sounds.   Abdominal:      General: Abdomen is flat.      Tenderness: There is abdominal tenderness.      Comments: Epigastric tenderness   Musculoskeletal:         General: No deformity.      Cervical  back: Normal range of motion and neck supple. No rigidity.   Skin:     General: Skin is warm.   Neurological:      Mental Status: She is alert and oriented to person, place, and time.   Psychiatric:         Mood and Affect: Mood normal.         Behavior: Behavior normal.         Thought Content: Thought content normal.         Judgment: Judgment normal.       DIAGNOSTIC RESULTS     EKG: All EKG's are interpreted by the Emergency Department Physician who either signs or Co-signs this chart in the absence of a cardiologist.    EKG shows sinus bradycardia with a rate of 58.  PR interval 166 cures duration 96 QTc 445.  There are no ST elevations depressions or T wave inversions.    RADIOLOGY:   Non-plain film images such as CT, Ultrasound and MRI are read by the radiologist. Plain radiographic images are visualized and preliminarily interpreted by the emergency physician with the below findings:    ***    Interpretation per the Radiologist below, if available at the time of this note:    XR CHEST PORTABLE    (Results Pending)         ED BEDSIDE ULTRASOUND:   Performed by ED Physician - none    LABS:  Labs Reviewed   CBC WITH AUTO DIFFERENTIAL   BASIC METABOLIC PANEL   TROPONIN   BRAIN NATRIURETIC PEPTIDE   MAGNESIUM   PROTIME-INR       All other labs were within normal range or not returned as of this dictation.    EMERGENCY DEPARTMENT COURSE and DIFFERENTIAL DIAGNOSIS/MDM:   Vitals:    Vitals:    10/02/21 1652 10/02/21 1656   BP: (!) 143/66 135/61   Pulse: 58 56   Resp: 16 12   SpO2: 98% 95%   Weight: 181 lb (82.1 kg)    Height: 5\' 3"  (1.6 m)        ***    Medical Decision Making  Amount and/or Complexity of Data Reviewed  Labs: ordered.  ECG/medicine tests: ordered.    Risk  OTC drugs.  Prescription drug management.            REASSESSMENT          CRITICAL CARE TIME   Total Critical Care time was *** minutes,  excluding separately reportable procedures.  There was a high probability of clinically significant/life  threatening deterioration in the patient's condition which required my urgent intervention.  ***    CONSULTS:  None    PROCEDURES:  Unless otherwise noted below, none     Procedures    No LOS Charge filed ***    FINAL IMPRESSION    No diagnosis found.      DISPOSITION/PLAN   DISPOSITION        PATIENT REFERRED TO:  No follow-up provider specified.    DISCHARGE MEDICATIONS:  New Prescriptions    No medications on file     Controlled Substances Monitoring:     No flowsheet data found.    (Please note that portions of this note were completed with a voice recognition program.  Efforts were made to edit the dictations but occasionally words are mis-transcribed.)    Arvella MerlesJana Gaylo, PA (electronically signed)  Attending Emergency Physician

## 2021-10-02 NOTE — ED Notes (Signed)
Pt d/cd to home awake, alert and in NAD.  All questions answered.      Maryagnes Amos, RN  10/02/21 [phone]

## 2021-10-02 NOTE — ED Triage Notes (Signed)
Pt arrives by EMS endorsing chest pain under her left breast radiating to the center of her chest.  Pt states that it began this morning.  Pt has an extensive cardiac history, a halter monitor was observed above her left breast, pt states that she's had valve replacement and is being monitored for leaking valves at this time.  Pt is on warfarin currently with a hx of Afib, pt states that she has been unable to pick up a new cardiac medication, unclear if patient is med compliant based on a comment she made about 'weighing myself every morning to see if I need to take my fluid pill' RN and provider made aware.  Pt states that she smokes at least a pack daily.    EKG and vitals were preformed upon arrival to the ED, chest xray has been completed bedside.  Pt was able to stand and ambulate to the bed with assistance by medics

## 2021-10-03 LAB — EKG 12-LEAD
Atrial Rate: 58 {beats}/min
P Axis: 92 degrees
P-R Interval: 166 ms
Q-T Interval: 454 ms
QRS Duration: 96 ms
QTc Calculation (Bazett): 445 ms
R Axis: 49 degrees
T Axis: 63 degrees
Ventricular Rate: 58 {beats}/min

## 2021-10-03 LAB — TROPONIN
Troponin, High Sensitivity: 6 ng/L (ref 0–54)
Troponin, High Sensitivity: 6 ng/L (ref 0–54)

## 2021-10-03 MED ORDER — WARFARIN DAILY DOSING BY PROVIDER (PLACEHOLDER)
Status: DC
Start: 2021-10-03 — End: 2021-10-03

## 2021-10-03 MED ORDER — WARFARIN SODIUM 5 MG PO TABS
5 MG | Freq: Once | ORAL | Status: AC
Start: 2021-10-03 — End: 2021-10-02
  Administered 2021-10-03: 03:00:00 5 mg via ORAL

## 2021-10-03 MED ORDER — PREGABALIN 50 MG PO CAPS
50 MG | Freq: Two times a day (BID) | ORAL | Status: DC
Start: 2021-10-03 — End: 2021-10-03
  Administered 2021-10-03: 02:00:00 200 mg via ORAL

## 2021-10-03 MED FILL — NITRO-BID 2 % TD OINT: 2 % | TRANSDERMAL | Qty: 1

## 2021-10-03 MED FILL — PREGABALIN 50 MG PO CAPS: 50 MG | ORAL | Qty: 1

## 2021-10-03 MED FILL — WARFARIN DAILY DOSING BY PROVIDER (PLACEHOLDER): Qty: 1

## 2021-10-03 MED FILL — WARFARIN SODIUM 5 MG PO TABS: 5 MG | ORAL | Qty: 1

## 2021-10-04 LAB — PROTIME-INR: INR: 1.4

## 2021-10-04 NOTE — Patient Instructions (Signed)
[name] is here today for anticoagulation monitoring for the diagnosis of atrial fibrillation.  Her INR goal is 2.5-3.5 and her current Coumadin dose is 7.5mg  Tues. / Thursday the take; 5mg  A.O.D     Today's findings include an INR of 1.4     Considering [name] past history, todays findings, and per the coumadin policy/protocol, [name] was instructed to take Coumadin as follows,  10mg  5/22 then; 7.5mg  5/23 then; 5mg  5/24 & 5/25.  She was also instructed to come back in 5 days for an INR check.    A full discussion of the nature of anticoagulants has been carried out.  A full discussion of the need for frequent and regular monitoring, precise dosage adjustment and compliance was stressed.  Side effects of potential bleeding were discussed and [name] was instructed to call [phone] if there are any signs of abnormal bleeding.  [name] was instructed to avoid any OTC items containing aspirin or ibuprofen and prior to starting any new OTC products to consult with her physician or pharmacist to ensure no drug interactions are present.  [name] was instructed to avoid any major changes in her general diet and to avoid alcohol consumption.  .      [name] verbalized her understanding of all instructions and will call the office with any questions, concerns, or signs of abnormal bleeding or blood clot.

## 2021-10-05 ENCOUNTER — Encounter: Attending: Student in an Organized Health Care Education/Training Program | Primary: Family Medicine

## 2021-10-05 NOTE — Progress Notes (Unsigned)
Mary [name]  DOB: 06-18-1965  Encounter Date: 10/05/2021       No diagnosis found.    ASSESSMENT:    Sepsis/Pyelonephritis/UTI/Ecoli Bacteremia   UCx 08/22/21: >100K E. coli UCx 08/22/21: >100K E. coli   CT A/P WO Cont 08/23/21: Right kidney is mildly edematous, with perirenal stranding. Mild wall thickening and enhancement of the right renal pelvis. Findings are concerning for upper urinary tract infection/potentially very early pyelonephritis (no striated renal enhancement at this time).  UCx 08/23/21: No growth  UCx 08/31/21: No growth   CT A/P WO Cont 10/02/21: Kidneys demonstrate symmetric enhancement. No hydronephrosis. No calcified stones      PLAN:  ***          DISCUSSION:   ***        No chief complaint on file.    HISTORY OF PRESENT ILLNESS:   Mary [name] is a 56 y.o. female who presents today in consultation for pyelonephritis and has been referred by Dr. Lenn Sink.             Labs/Radiology    CT A/P WO Cont 08/23/21  FINDINGS:   Kidneys: 2 cm hypodense lesion posterior mid right kidney, measures just above simple fluid density, but no definite enhancement compared to recent prior noncontrast study. Delayed enhancement right kidney. Right kidney is mildly edematous, with perirenal stranding. Mild wall thickening and enhancement of the right renal pelvis. No urinary tract stones or hydronephrosis.  No left renal stones or hydronephrosis. Trace left perirenal stranding. Very mild left renal pelvis wall thickening and enhancement.  No striated areas of enhancement in the kidneys to definitively represent pyelonephritis.     IMPRESSION:  1. Mildly edematous right kidney, with perirenal stranding. Trace left perirenal stranding. Bilateral renal pelvis wall thickening and enhancement. Findings are concerning for upper urinary tract infection/potentially very early pyelonephritis (no striated renal enhancement at this time).  -Probable minimally complex cyst right kidney     2. Gallbladder is mildly  distended, but no wall thickening or pericholecystic fluid, and no stones visible by CT. Nonspecific.     3. Mild colonic diverticulosis.     4. Additional chronic findings as above.        No results found for this visit on 10/05/21.    Past Medical History:   Diagnosis Date    Anxiety     Bipolar 1 disorder (HCC)     CHF (congestive heart failure) (HCC)     COPD (chronic obstructive pulmonary disease) (HCC)     DDD (degenerative disc disease), cervical     GERD (gastroesophageal reflux disease)     H/O aortic valve replacement     History of transient ischemic attack (TIA)     Mitral regurgitation and mitral stenosis     PTSD (post-traumatic stress disorder)     Scoliosis     Vitamin D deficiency      Past Surgical History:   Procedure Laterality Date    CARDIAC CATHETERIZATION      CARDIAC VALVE SURGERY      mitral valve replacement    CESAREAN SECTION      HAND/FINGER SURGERY UNLISTED Right     I&D right middle finger    HYSTERECTOMY (CERVIX STATUS UNKNOWN)      KNEE SURGERY      OVARY REMOVAL       Family History   Problem Relation Age of Onset    No Known Problems Mother      Social  History     Socioeconomic History    Marital status: Divorced     Spouse name: Not on file    Number of children: Not on file    Years of education: Not on file    Highest education level: Not on file   Occupational History    Not on file   Tobacco Use    Smoking status: Every Day     Packs/day: 1.00     Types: Cigarettes    Smokeless tobacco: Never   Vaping Use    Vaping Use: Never used   Substance and Sexual Activity    Alcohol use: Never    Drug use: Yes     Types: Marijuana Sheran Fava)    Sexual activity: Not on file   Other Topics Concern    Not on file   Social History Narrative    Not on file     Social Determinants of Health     Financial Resource Strain: Not on file   Food Insecurity: Not on file   Transportation Needs: Not on file   Physical Activity: Not on file   Stress: Not on file   Social Connections: Not on file    Intimate Partner Violence: Not on file   Housing Stability: Not on file     Allergies   Allergen Reactions    Alprazolam Other (See Comments)     Gets suicidal    Diazepam Other (See Comments)     Gets suicidal    Levofloxacin Other (See Comments)     Muscle stiffness and bulging muscles     Tobacco     Adhesive Tape Rash    Lithium Nausea And Vomiting     Current Outpatient Medications   Medication Sig Dispense Refill    LORazepam (ATIVAN) 0.5 MG tablet Take 1 tablet by mouth every 6 hours as needed for Anxiety. Max Daily Amount: 2 mg      ranolazine (RANEXA) 500 MG extended release tablet Take 1 tablet by mouth 2 times daily 60 tablet 3    warfarin (COUMADIN) 2.5 MG tablet Take 1 tablet by mouth daily 90 tablet 1    citalopram (CELEXA) 20 MG tablet Take 1 tablet by mouth daily 30 tablet 0    pravastatin (PRAVACHOL) 40 MG tablet Take 1 tablet by mouth daily 30 tablet 0    metoprolol succinate (TOPROL XL) 25 MG extended release tablet Take 1 tablet by mouth daily 30 tablet 0    omeprazole (PRILOSEC) 40 MG delayed release capsule Take 1 capsule by mouth daily 30 capsule 0    albuterol-ipratropium (COMBIVENT RESPIMAT) 20-100 MCG/ACT AERS inhaler Inhale 1 puff into the lungs 4 times daily 1 each 0    lamoTRIgine (LAMICTAL) 25 MG tablet Take 1 tablet by mouth in the morning, at noon, and at bedtime 90 tablet 0    nicotine (NICODERM CQ) 14 MG/24HR Place 1 patch onto the skin daily 30 patch 0    vitamin B-12 (CYANOCOBALAMIN) 1000 MCG tablet Take 1 tablet by mouth daily      pregabalin (LYRICA) 200 MG capsule take 1 capsule by mouth three times a day 90 capsule 2    albuterol sulfate HFA (PROVENTIL;VENTOLIN;PROAIR) 108 (90 Base) MCG/ACT inhaler Inhale 2 puffs into the lungs every 6 hours as needed      aspirin 81 MG chewable tablet Take 1 tablet by mouth daily      cetirizine (ZYRTEC) 10 MG tablet Take 1 tablet by mouth daily  docusate (COLACE, DULCOLAX) 100 MG CAPS Col-Rite 100 mg capsule   take 1 capsule by mouth  twice a day if needed for constipation      fluticasone (FLONASE) 50 MCG/ACT nasal spray 1 spray by nasal route daily      furosemide (LASIX) 40 MG tablet Take 1 tablet by mouth daily as needed       Current Facility-Administered Medications   Medication Dose Route Frequency Provider Last Rate Last Admin    nitroGLYCERIN (NITROSTAT) SL tablet 0.4 mg  0.4 mg SubLINGual Q5 Min PRN Sandre Kitty, MD           Physical Examination  There were no vitals taken for this visit.  Constitutional: Well developed, well-nourished female in no acute distress.   CV:  No peripheral swelling noted  Respiratory: No respiratory distress or difficulties  Abdomen:  Soft and nontender. No masses.    GU Female:  No CVA tenderness.  ***  Skin: No bruising or rashes.  No petechia.    Neuro/Psych:  Patient with appropriate affect.  Alert and oriented.      CC: Lenn Sink, MD     Maudry Diego, PA-C  [facility],   [address]   [place], [place] [address]  [phone] (office)      Medical documentation provided with assistance by Elisha Headland, medical scribe for Elisha Headland on 10/05/2021.

## 2021-10-07 ENCOUNTER — Ambulatory Visit: Admit: 2021-10-07 | Discharge: 2021-10-07 | Payer: MEDICARE | Attending: Surgery | Primary: Family Medicine

## 2021-10-07 DIAGNOSIS — R1033 Periumbilical pain: Secondary | ICD-10-CM

## 2021-10-07 NOTE — Progress Notes (Signed)
Mary [name] is a 56 y.o. female (DOB: [birth year]) presenting to address:    Chief Complaint   Patient presents with    3 Month Follow-Up     Discuss Ct Abd pelv and CTA chest done on 09/2021 for abd bulge and chest wall mass       Medication list and allergies have been reviewed with Johnathan Hausen and updated as of today's date.     I have gone over all Medical, Surgical and Social History with Makayia Duplessis and updated/added the information accordingly.       1. Have you been to the ER, Urgent Care or Hospitalized since your last visit? Yes. [facility]- ER 08/31/21 and 10/02/21. Multiple medical conditions          2. Have you followed up with your PCP or any other Physicians since your procedure/ last office visit?   No

## 2021-10-07 NOTE — Progress Notes (Signed)
General Surgery Consult      Mary [name]  Admit date: (Not on file)    MRN: [id number]     DOB: 02-Apr-1966     Age: 56 y.o.        Attending Physician: Bradly ChrisYassar K Luverne Farone, MD, FACS      History of Present Illness:     Mary [name] is a 56 y.o. female who I had seen 3 months ago for evaluation of abdominal pain.  The patient has a multiple medical problems and she has been noticing a bulge in the epigastric area at the site of the scar from a previous cardiac bypass.  The patient has stated that the bulge is present all the time and sometimes it changes in size.   The patient stated that she does not have any generalized abdominal pain anymore however she still have severe pain at the epigastric area at the site of the scar of the chest tube.  The patient has lost more than 15 pounds over the last few months and she feels much better.    Patient Active Problem List    Diagnosis Date Noted    Sepsis (HCC) 08/23/2021    Bacteremia 08/23/2021    Septic shock (HCC) 08/23/2021    Chronic bilateral thoracic back pain 01/11/2021    Thoracic compression fracture, sequela 01/11/2021    Mitral valve replaced 12/17/2020    Rib pain on left side 07/10/2020    Asthma 07/10/2020    Osteopenia 07/10/2020    Hyperlipidemia 07/10/2020    Diabetes mellitus (HCC) 07/10/2020    Chest congestion 07/10/2020    Elevated INR 07/10/2020    History of mitral valve replacement with bioprosthetic valve 07/10/2020    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    Anxiety disorder 07/10/2020    Bipolar affective disorder, depressed in partial remission (HCC) 07/10/2020    Heart valve pulmonary stenosis 07/10/2020    Lumbar compression fracture (HCC) 07/10/2020    Afib (HCC) 07/10/2020    Allergy 07/10/2020    Abrasion of right middle finger with infection 07/05/2020    Suppurative tenosynovitis of flexor tendon of right hand 07/05/2020    Cellulitis and abscess of hand 07/05/2020    Abscess of finger 07/05/2020    Chronic pain syndrome 06/03/2020     COVID-19 05/03/2020    Respiratory failure (HCC) 05/03/2020    Mitral regurgitation 04/23/2020    DDD (degenerative disc disease), lumbosacral 08/21/2019    TIA (transient ischemic attack) 07/24/2017    Severe obesity with body mass index (BMI) of 35.0 to 39.9 with serious comorbidity (HCC) 07/08/2017    Sensorineural hearing loss (SNHL) of both ears 07/05/2017    Bipolar I disorder with depression, severe (HCC) 02/07/2017    Severe obesity (BMI >= 40) (HCC) 12/17/2015    Chronic obstructive pulmonary disease (HCC) 08/24/2015    Moderate to severe pulmonary hypertension (HCC) 07/10/2015    Diastolic CHF, chronic (HCC) 07/05/2015    Dyslipidemia 06/12/2015    Onycholysis 03/20/2015    Facial neuropathy, traumatic, left, 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    Nicotine dependence 10/09/2013    Nodule of right lung 09/25/2013    Onychomycosis 03/18/2013    Antral gastritis 09/28/2012    Allergic rhinitis 11/09/2011     Past Medical History:   Diagnosis Date    Anxiety     Bipolar 1 disorder (HCC)     CHF (congestive heart failure) (HCC)     COPD (chronic obstructive pulmonary disease) (HCC)  DDD (degenerative disc disease), cervical     GERD (gastroesophageal reflux disease)     H/O aortic valve replacement     History of transient ischemic attack (TIA)     Mitral regurgitation and mitral stenosis     PTSD (post-traumatic stress disorder)     Scoliosis     Vitamin D deficiency       Past Surgical History:   Procedure Laterality Date    CARDIAC CATHETERIZATION      CARDIAC VALVE SURGERY      mitral valve replacement    CESAREAN SECTION      HAND/FINGER SURGERY UNLISTED Right     I&D right middle finger    HYSTERECTOMY (CERVIX STATUS UNKNOWN)      KNEE SURGERY      OVARY REMOVAL        Social History     Tobacco Use    Smoking status: Every Day     Packs/day: 1.00     Types: Cigarettes    Smokeless tobacco: Never   Substance Use Topics    Alcohol use: Never      Social History     Tobacco Use   Smoking Status Every Day    Packs/day:  1.00    Types: Cigarettes   Smokeless Tobacco Never     Family History   Problem Relation Age of Onset    No Known Problems Mother       Current Outpatient Medications   Medication Sig    LORazepam (ATIVAN) 0.5 MG tablet Take 1 tablet by mouth every 6 hours as needed for Anxiety.    citalopram (CELEXA) 20 MG tablet Take 1 tablet by mouth daily    metoprolol succinate (TOPROL XL) 25 MG extended release tablet Take 1 tablet by mouth daily    omeprazole (PRILOSEC) 40 MG delayed release capsule Take 1 capsule by mouth daily    albuterol-ipratropium (COMBIVENT RESPIMAT) 20-100 MCG/ACT AERS inhaler Inhale 1 puff into the lungs 4 times daily    lamoTRIgine (LAMICTAL) 25 MG tablet Take 1 tablet by mouth in the morning, at noon, and at bedtime    nicotine (NICODERM CQ) 14 MG/24HR Place 1 patch onto the skin daily    albuterol sulfate HFA (PROVENTIL;VENTOLIN;PROAIR) 108 (90 Base) MCG/ACT inhaler Inhale 2 puffs into the lungs every 6 hours as needed    aspirin 81 MG chewable tablet Take 1 tablet by mouth daily    cetirizine (ZYRTEC) 10 MG tablet Take 1 tablet by mouth daily    docusate (COLACE, DULCOLAX) 100 MG CAPS Col-Rite 100 mg capsule   take 1 capsule by mouth twice a day if needed for constipation    fluticasone (FLONASE) 50 MCG/ACT nasal spray 1 spray by nasal route daily    furosemide (LASIX) 40 MG tablet Take 1 tablet by mouth daily as needed    ranolazine (RANEXA) 500 MG extended release tablet Take 1 tablet by mouth 2 times daily    warfarin (COUMADIN) 2.5 MG tablet Take 1 tablet by mouth daily    pravastatin (PRAVACHOL) 40 MG tablet Take 1 tablet by mouth daily    vitamin B-12 (CYANOCOBALAMIN) 1000 MCG tablet Take 1 tablet by mouth daily    pregabalin (LYRICA) 200 MG capsule take 1 capsule by mouth three times a day     Current Facility-Administered Medications   Medication Dose Route Frequency    nitroGLYCERIN (NITROSTAT) SL tablet 0.4 mg  0.4 mg SubLINGual Q5 Min PRN  Allergies   Allergen Reactions     Alprazolam Other (See Comments)     Gets suicidal    Diazepam Other (See Comments)     Gets suicidal    Levofloxacin Other (See Comments)     Muscle stiffness and bulging muscles     Tobacco     Adhesive Tape Rash    Lithium Nausea And Vomiting        Review of Systems:  Pertinent items are noted in the History of Present Illness.    Objective:     Ht 5\' 3"  (1.6 m)   Wt 180 lb (81.6 kg)   BMI 31.89 kg/m     Physical Exam:      General:  in no apparent distress and alert   Eyes:  conjunctivae and sclerae normal, pupils equal, round, reactive to light   Throat & Neck: normal, no erythema or exudates noted. , and no palpable masses   Lungs:   clear to auscultation bilaterally   Heart:  Regular rate and rhythm   Abdomen:   flat, soft, nontender, nondistended.  There is a bulge at the epigastric area at the site of the scar that is extremely tender.  I am not sure if this is a hernia or scarring/bone from the site of the chest tube.    Extremities: extremities normal, atraumatic, no cyanosis or edema   Skin: Normal.       Imaging and Lab Review:     CBC:   Lab Results   Component Value Date/Time    WBC 6.2 10/02/2021 05:10 PM    RBC 4.39 10/02/2021 05:10 PM    HGB 12.8 10/02/2021 05:10 PM    HCT 39.4 10/02/2021 05:10 PM    PLT 224 10/02/2021 05:10 PM     BMP:   Lab Results   Component Value Date/Time    NA 140 10/02/2021 05:10 PM    K 4.4 10/02/2021 05:10 PM    CL 110 10/02/2021 05:10 PM    CO2 27 10/02/2021 05:10 PM    BUN 14 10/02/2021 05:10 PM     CMP:  Lab Results   Component Value Date/Time    NA 140 10/02/2021 05:10 PM    K 4.4 10/02/2021 05:10 PM    CL 110 10/02/2021 05:10 PM    CO2 27 10/02/2021 05:10 PM    BUN 14 10/02/2021 05:10 PM    GLOB 3.2 08/31/2021 08:50 AM       No results found for this or any previous visit (from the past 24 hour(s)).    images and reports reviewed    Assessment:   Mary [name] is a 56 y.o. female who is presenting with an interesting clinical scenario.  Patient has multiple  medical conditions and she has been having severe pain in the epigastric area at the site of the scar of her previous chest tube.  I did order a CT scan of abdomen and pelvis because I thought that this is currently a hernia at the site of the tube however it did not show a clear hernia but on my examination the patient has severe tenderness and what seems to be a mass.  The mass is relatively hard but it is extremely tender and hard to assess on examination.  I explained to the patient that still this could be a hernia but it also could be some scarring or some of the bone fragment from the sternal when they placed the chest tube.  We could do exploration and repair it if it is a hernia or excise it if it is scar/bone however given the fact that she has been losing weight I would like her to lose more weight and then reevaluate the situation which she agreed.     Plan:     I encouraged the patient to continue losing weight  Follow-up with me in 3 months    Please call me if you have any questions (cell phone: [phone])     Signed By: Bradly Chris, MD     Oct 07, 2021

## 2021-10-07 NOTE — Addendum Note (Signed)
Addended by: Ashley Mariner on: 10/07/2021 11:30 AM     Modules accepted: Orders

## 2021-10-12 LAB — PROTIME-INR: INR: 1.3

## 2021-10-12 NOTE — Telephone Encounter (Signed)
Patient is requesting a refill of pregabalin (LYRICA) 200 MG capsule called in to [facility] on [address].  She has been recently hospitalized with sepsis from a kidney infection and also had 2 of her heart valves leaking.  She's not sure she could come into office if an appointment is required for this refill.  Please advise patient at [phone].

## 2021-10-12 NOTE — Telephone Encounter (Signed)
I called and spoke to the pt. The pt was identified using 2 pt identifiers. She was advised that she will at least need to have a telephone visit in order to get a refill on her medication. I offered the pt a telephone visit with the NP this Friday 10/15/21 at 0840. The pt accepted the appt. No questions or concerns voiced at this time.

## 2021-10-12 NOTE — Patient Instructions (Signed)
SWP

## 2021-10-15 ENCOUNTER — Telehealth: Admit: 2021-10-15 | Discharge: 2021-10-20 | Payer: MEDICARE | Attending: Nurse Practitioner | Primary: Family Medicine

## 2021-10-15 DIAGNOSIS — G8929 Other chronic pain: Secondary | ICD-10-CM

## 2021-10-15 MED ORDER — PREGABALIN 200 MG PO CAPS
200 MG | ORAL_CAPSULE | Freq: Three times a day (TID) | ORAL | 5 refills | Status: AC
Start: 2021-10-15 — End: 2022-04-13

## 2021-10-15 NOTE — Progress Notes (Signed)
History of Present Illness:    Consent: Mary [name], who was seen by telephone call visit and/or her healthcare decision maker, is aware that this patient-initiated, Telehealth encounter on 10/15/2021 is a billable service, with coverage as determined by her insurance carrier. She is aware that she may receive a bill and has provided verbal consent to proceed: Yes.  The provider was located at the [facility] office and the patient was located at their home.   No one else participated in the visit with the patient.  The platform used was telephone call.    The patient is a 56 year old female who has back pain with right-sided leg pain down to her knee.  She states it travels to her buttock.  She states she does get some numbness and tingling from time to time.  She will also takes as needed  Zanaflex.  She states the Lyrica works very well for her and she would like to have that refilled.  She is on Social Security disability.  She is a half a pack a day smoker.  She is using the nicotine Derm patch to try and quit smoking altogether.  She denies fever bowel bladder dysfunction.      Physical Exam: The patient is a 56 year old female who is alert and oriented.  She spoke with fluency.  She did not appear to be in distress.        Return in about 6 months (around 04/16/2022) for In the office.     We discussed the expected course, resolution and complications of the diagnosis(es) in detail.  Medication risks, benefits, costs, interactions, and alternatives were discussed as indicated.  I advised her to contact the office if her condition worsens, changes or fails to improve as anticipated. For emergencies or worsening neurological symptoms the patient was instructed to call 911.  She expressed understanding with the diagnosis(es) and plan.       Mary [name] is a 56 y.o. female being evaluated by a telephone call  encounter for concerns as above.  (A caregiver was present when appropriate. Due to this being a TeleHealth  encounter  evaluation of the following organ systems was limited: Vitals/Constitutional/EENT/Resp/CV/GI/GU/MS/Neuro/Skin/Heme-Lymph-Imm.         CPT Codes [phone] for Established Patients may apply to this Telehealth Visit      On this date @date @ I have spent 15 minutes reviewing previous notes, test results and face to face with the patient discussing the diagnosis and importance of compliance with the treatment plan as well as documenting on the day of the visit.  Start time: 8:48 AM and Stop time: 9:03 AM.      Due to this being a TeleHealth evaluation, many elements of the physical examination are unable to be assessed.          Services were provided through a telephone call  discussion virtually to substitute for in-person clinic visit.    , APRN - NP

## 2021-10-19 LAB — PROTIME-INR: INR: 2.4

## 2021-10-19 NOTE — Patient Instructions (Signed)
Left on machine

## 2021-10-26 LAB — PROTIME-INR: INR: 1.9

## 2021-10-26 NOTE — Patient Instructions (Signed)
Left on machine

## 2021-11-02 LAB — PROTIME-INR: INR: 2.5

## 2021-11-02 NOTE — Patient Instructions (Signed)
Left message

## 2021-11-05 ENCOUNTER — Ambulatory Visit: Payer: MEDICARE | Attending: Cardiovascular Disease | Primary: Family Medicine

## 2021-11-05 NOTE — Progress Notes (Unsigned)
HISTORY OF PRESENT ILLNESS  Mary [name] is a 56 y.o. female.    PMH History of Mitral valve replacement Bioprosthetic, CHF, Bipolar, Anxiety, Left atrial clip,   -----  CARDIAC STUDIES  -----  2d tte 08/06/2021    Left Ventricle: Normal left ventricular systolic function with a visually estimated EF of 55 - 60%. Left ventricle is mildly dilated. Increased wall thickness. See diagram for wall motion findings. Diastolic function present with increased LAP with normal LV EF.    Right Ventricle: Right ventricle is mildly dilated. RVIDd is 4.3 cm.    Aortic Valve: Moderate regurgitation with a centrally directed jet. Vena contracta measures 0.5 cm.    Mitral Valve: Bioprosthetic valve that is well-seated with normal leaflet motion. MV mean gradient is 7 mmHg. Moderate regurgitation.    Left Atrium: Left atrium is mildly dilated. Left atrial volume index is mildly increased (35-41 mL/m2). LA Vol Index A/L is 38 mL/m2.    Pulmonary Arteries: Pulmonary hypertension not present. The estimated PASP is 30 mmHg.  -----  LHC 04/2020  Mild single vessel coronary artery disease with preserved LV function.  Elevated LVEDP.  Recommend intense risk factor modification.  -----  Vascular duplex upper extremity venous left   Acute non-occlusive deep vein thrombosis in 1 of 2 brachial veins within the left upper extremity.   Superficial acute non-occlusive thrombosis in the cephalic and basilic veins within the left upper extremity.  ----  Nuclear stress 08/2021  Low Risk Stress Test  No significant resting or reversible defects present  Normal ejection fraction 74% without significant TID 0.88  SSS = 0, SRS = 2, SDS = -2  ----   Patient states that she gets upset and can get some chest discomfort, reports that trying to get to a psychiatrist.  Patient states that usually sharp.  Sometimes comes in the back but states that does not feel like muskuloskeletal.  There is a correlate with blood pressure elevation.  Reports that at least  mild with exertion sometimes but restricted with back brace.  reports some swelling in the left leg, easy bruiser.  Denies blood clots.  Denies PND, denies fatigue, reports some chronic shortness of breath with minimal exertion, denies orthopnea, reports palpitations.  Reviewed with patient remains as such but now reports palpitations.      Family History   Problem Relation Age of Onset    No Known Problems Mother        Past Medical History:   Diagnosis Date    Anxiety     Bipolar 1 disorder (HCC)     CHF (congestive heart failure) (HCC)     COPD (chronic obstructive pulmonary disease) (HCC)     DDD (degenerative disc disease), cervical     GERD (gastroesophageal reflux disease)     H/O aortic valve replacement     History of transient ischemic attack (TIA)     Mitral regurgitation and mitral stenosis     PTSD (post-traumatic stress disorder)     Scoliosis     Vitamin D deficiency        Past Surgical History:   Procedure Laterality Date    CARDIAC CATHETERIZATION      CARDIAC VALVE SURGERY      mitral valve replacement    CESAREAN SECTION      HAND/FINGER SURGERY UNLISTED Right     I&D right middle finger    HYSTERECTOMY (CERVIX STATUS UNKNOWN)      KNEE SURGERY  OVARY REMOVAL         Social History     Tobacco Use    Smoking status: Every Day     Packs/day: 1.00     Types: Cigarettes    Smokeless tobacco: Never   Substance Use Topics    Alcohol use: Never       Allergies   Allergen Reactions    Alprazolam Other (See Comments)     Gets suicidal    Diazepam Other (See Comments)     Gets suicidal    Levofloxacin Other (See Comments)     Muscle stiffness and bulging muscles     Tobacco     Adhesive Tape Rash    Lithium Nausea And Vomiting       Prior to Admission medications    Medication Sig Start Date End Date Taking? Authorizing Provider   pregabalin (LYRICA) 200 MG capsule Take 1 capsule by mouth 3 times daily for 180 days. 10/15/21 04/13/22  Valetta Fuller, APRN - NP   LORazepam (ATIVAN) 0.5 MG tablet Take 1  tablet by mouth every 6 hours as needed for Anxiety.    Historical Provider, MD   ranolazine (RANEXA) 500 MG extended release tablet Take 1 tablet by mouth 2 times daily 09/24/21   Sandre Kitty, MD   warfarin (COUMADIN) 2.5 MG tablet Take 1 tablet by mouth daily 09/14/21   Nicanor Bake, APRN - NP   citalopram (CELEXA) 20 MG tablet Take 1 tablet by mouth daily 08/27/21   Rowan Blase, MD   pravastatin (PRAVACHOL) 40 MG tablet Take 1 tablet by mouth daily 08/27/21   Rowan Blase, MD   metoprolol succinate (TOPROL XL) 25 MG extended release tablet Take 1 tablet by mouth daily 08/27/21   Rowan Blase, MD   omeprazole (PRILOSEC) 40 MG delayed release capsule Take 1 capsule by mouth daily 08/27/21   Rowan Blase, MD   albuterol-ipratropium (COMBIVENT RESPIMAT) 20-100 MCG/ACT AERS inhaler Inhale 1 puff into the lungs 4 times daily 08/27/21   Rowan Blase, MD   lamoTRIgine (LAMICTAL) 25 MG tablet Take 1 tablet by mouth in the morning, at noon, and at bedtime 08/27/21   Rowan Blase, MD   nicotine (NICODERM CQ) 14 MG/24HR Place 1 patch onto the skin daily 08/27/21   Rowan Blase, MD   vitamin B-12 (CYANOCOBALAMIN) 1000 MCG tablet Take 1 tablet by mouth daily    Historical Provider, MD   albuterol sulfate HFA (PROVENTIL;VENTOLIN;PROAIR) 108 (90 Base) MCG/ACT inhaler Inhale 2 puffs into the lungs every 6 hours as needed 11/19/19   Ar Automatic Reconciliation   aspirin 81 MG chewable tablet Take 1 tablet by mouth daily    Ar Automatic Reconciliation   cetirizine (ZYRTEC) 10 MG tablet Take 1 tablet by mouth daily 10/23/19   Ar Automatic Reconciliation   docusate (COLACE, DULCOLAX) 100 MG CAPS Col-Rite 100 mg capsule   take 1 capsule by mouth twice a day if needed for constipation    Ar Automatic Reconciliation   fluticasone (FLONASE) 50 MCG/ACT nasal spray 1 spray by nasal route daily 05/09/20   Ar Automatic Reconciliation   furosemide (LASIX) 40 MG tablet Take 1  tablet by mouth daily as needed    Ar Automatic Reconciliation       No results found for: LIPIDPAN, BMP, CMP     There were no vitals taken for this visit.    HPI    Review of  Systems   Constitutional:  Negative for activity change, appetite change, diaphoresis, fatigue and unexpected weight change.   Eyes:  Negative for visual disturbance.   Respiratory:  Positive for chest tightness and shortness of breath. Negative for apnea, cough and wheezing.    Cardiovascular:  Negative for chest pain, palpitations and leg swelling.   Gastrointestinal:  Negative for abdominal pain, blood in stool, constipation, diarrhea and nausea.   Endocrine: Negative for cold intolerance and heat intolerance.   Genitourinary:  Negative for decreased urine volume and difficulty urinating.   Musculoskeletal:  Negative for gait problem, myalgias and neck pain.   Skin:  Negative for color change, pallor and rash.   Neurological:  Negative for dizziness, syncope, facial asymmetry, speech difficulty, weakness, light-headedness and numbness.   Psychiatric/Behavioral:  Negative for confusion and sleep disturbance.        Physical Exam  Vitals reviewed.   Constitutional:       General: She is not in acute distress.     Appearance: Normal appearance. She is not ill-appearing or diaphoretic.   HENT:      Head: Normocephalic and atraumatic.   Eyes:      General: No scleral icterus.        Right eye: No discharge.         Left eye: No discharge.      Conjunctiva/sclera: Conjunctivae normal.   Neck:      Vascular: No carotid bruit.   Cardiovascular:      Rate and Rhythm: Normal rate and regular rhythm.      Heart sounds: Normal heart sounds. No murmur heard.    No friction rub. No gallop.   Pulmonary:      Effort: Pulmonary effort is normal. No respiratory distress.      Breath sounds: Normal breath sounds. No wheezing, rhonchi or rales.   Chest:      Chest wall: No tenderness.   Abdominal:      General: Bowel sounds are normal. There is no distension.       Palpations: Abdomen is soft.      Tenderness: There is no abdominal tenderness. There is no guarding.   Musculoskeletal:         General: No swelling or tenderness.      Cervical back: Neck supple.      Right lower leg: No edema.      Left lower leg: No edema.   Skin:     General: Skin is warm and dry.      Findings: No erythema or rash.   Neurological:      Mental Status: She is alert. Mental status is at baseline.      Motor: No weakness.      Gait: Gait normal.   Psychiatric:         Mood and Affect: Mood normal.         Behavior: Behavior normal.         Thought Content: Thought content normal.       ASSESSMENT and PLAN  Ms. Waight has a reminder for a "due or due soon" health maintenance. I have asked that she contact her primary care provider for follow-up on this health maintenance.     HLD - Continue pravastatin 40mg  po daily, survey lipid panel periodically.       Atrial fibrillation - On coumadin, requests refill, Check for primary prevention of stroke.  Check heart monitor for 2 weeks evaluate with reports palpitations, and also  may be source of chest discomfort RVR if occurs.       HTN - Normotensive today, metoprolol succinate 25mg  po daily,      Chronic heart failure preserved ejection fraction - Continue jardiance 10mg  po daily, monitor fluid status, adjust as necessary, fluid restrication 2L/day, salt intake <2g per day. Currently on Lasix 40 mg p.o. daily as needed weight gain 2 to 3 pounds     Chest pain -   Low risk stress test 08/2021 but with continued smoking, sent ranexa 500mg  po bid, sent Nitro PRN. Patient advised that if increasing frequency duration and intensity of discomfort or unrelieved discomfort to call EMS to visit emergency department.     Moderate aortic regurgitation - Survey periodically 1-2 years.      Mitral valve bioprosthetic with mean gradient 7 mmHg 08/02/2021 - Survey periodically 1-2 years.      Moderate mitral regurgitation - Survey periodically 1-2 years.

## 2021-11-08 ENCOUNTER — Ambulatory Visit: Admit: 2021-11-08 | Discharge: 2021-11-08 | Payer: MEDICARE | Attending: Orthopaedic Surgery | Primary: Family Medicine

## 2021-11-08 DIAGNOSIS — M65311 Trigger thumb, right thumb: Secondary | ICD-10-CM

## 2021-11-08 LAB — PROTIME-INR: INR: 3

## 2021-11-08 MED ORDER — TRIAMCINOLONE ACETONIDE 10 MG/ML IJ SUSP
10 MG/ML | Freq: Once | INTRAMUSCULAR | Status: AC
Start: 2021-11-08 — End: 2021-11-08
  Administered 2021-11-08: 14:00:00 5 mg via INTRALESIONAL

## 2021-11-08 NOTE — Patient Instructions (Signed)
SWP

## 2021-11-08 NOTE — Progress Notes (Signed)
Mary [name] is a 56 y.o. female right handed.  Worker's Press photographer considerations: none    Chief Complaint   Patient presents with    Finger Pain     Right trigger thumb injection     Pain Score:   5    HPI: Patient presents today due to recurrence of right thumb pain and locking.  She reports her previous trigger thumb injection relieved most of her symptoms.    06/10/2021 HPI: Patient presents today with a new problem of right thumb pain and locking.     Initial HPI: Patient presents today for follow-up of right middle finger I&D of the dorsum of the finger as well as the flexor tendon sheath.  She reports to be doing well and has completed her antibiotics.  She denies any recurrence of pain.  She reports some stiffness in the finger and she is doing some exercises at home.  She reports no one is called her about therapy yet.    Date of onset: January 2023  Injury: No  Prior Treatment:  Yes: Comment: Right trigger thumb injection.  Debridement of right middle finger.    ROS: Review of Systems - General ROS: negative except HPI    Past Medical History:   Diagnosis Date    Anxiety     Bipolar 1 disorder (HCC)     CHF (congestive heart failure) (HCC)     COPD (chronic obstructive pulmonary disease) (HCC)     DDD (degenerative disc disease), cervical     GERD (gastroesophageal reflux disease)     H/O aortic valve replacement     History of transient ischemic attack (TIA)     Mitral regurgitation and mitral stenosis     PTSD (post-traumatic stress disorder)     Scoliosis     Vitamin D deficiency        Past Surgical History:   Procedure Laterality Date    CARDIAC CATHETERIZATION      CARDIAC VALVE SURGERY      mitral valve replacement    CESAREAN SECTION      HAND/FINGER SURGERY UNLISTED Right     I&D right middle finger    HYSTERECTOMY (CERVIX STATUS UNKNOWN)      KNEE SURGERY      OVARY REMOVAL          Current Outpatient Medications   Medication Sig Dispense Refill    pregabalin (LYRICA) 200 MG capsule  Take 1 capsule by mouth 3 times daily for 180 days. 90 capsule 5    LORazepam (ATIVAN) 0.5 MG tablet Take 1 tablet by mouth every 6 hours as needed for Anxiety.      ranolazine (RANEXA) 500 MG extended release tablet Take 1 tablet by mouth 2 times daily 60 tablet 3    warfarin (COUMADIN) 2.5 MG tablet Take 1 tablet by mouth daily 90 tablet 1    citalopram (CELEXA) 20 MG tablet Take 1 tablet by mouth daily 30 tablet 0    pravastatin (PRAVACHOL) 40 MG tablet Take 1 tablet by mouth daily 30 tablet 0    metoprolol succinate (TOPROL XL) 25 MG extended release tablet Take 1 tablet by mouth daily 30 tablet 0    omeprazole (PRILOSEC) 40 MG delayed release capsule Take 1 capsule by mouth daily 30 capsule 0    albuterol-ipratropium (COMBIVENT RESPIMAT) 20-100 MCG/ACT AERS inhaler Inhale 1 puff into the lungs 4 times daily 1 each 0    lamoTRIgine (LAMICTAL) 25 MG tablet Take  1 tablet by mouth in the morning, at noon, and at bedtime 90 tablet 0    nicotine (NICODERM CQ) 14 MG/24HR Place 1 patch onto the skin daily 30 patch 0    vitamin B-12 (CYANOCOBALAMIN) 1000 MCG tablet Take 1 tablet by mouth daily      albuterol sulfate HFA (PROVENTIL;VENTOLIN;PROAIR) 108 (90 Base) MCG/ACT inhaler Inhale 2 puffs into the lungs every 6 hours as needed      aspirin 81 MG chewable tablet Take 1 tablet by mouth daily      cetirizine (ZYRTEC) 10 MG tablet Take 1 tablet by mouth daily      docusate (COLACE, DULCOLAX) 100 MG CAPS Col-Rite 100 mg capsule   take 1 capsule by mouth twice a day if needed for constipation      fluticasone (FLONASE) 50 MCG/ACT nasal spray 1 spray by nasal route daily      furosemide (LASIX) 40 MG tablet Take 1 tablet by mouth daily as needed       Current Facility-Administered Medications   Medication Dose Route Frequency Provider Last Rate Last Admin    triamcinolone acetonide (KENALOG) injection 5 mg  5 mg Intra-LESional Once Keyshawna Prouse A Jozie Wulf, DO        nitroGLYCERIN (NITROSTAT) SL tablet 0.4 mg  0.4 mg SubLINGual Q5  Min PRN Sandre Kitty, MD           Allergies   Allergen Reactions    Alprazolam Other (See Comments)     Gets suicidal    Diazepam Other (See Comments)     Gets suicidal    Levofloxacin Other (See Comments)     Muscle stiffness and bulging muscles     Tobacco     Adhesive Tape Rash    Lithium Nausea And Vomiting         Temp (!) 95.9 F (35.5 C) (Temporal)   Ht 5\' 3"  (1.6 m)   Wt 180 lb (81.6 kg)   BMI 31.89 kg/m   Physical Exam  Vitals and nursing note reviewed.   Constitutional:       General: She is not in acute distress.     Appearance: Normal appearance. She is not ill-appearing.   Cardiovascular:      Pulses: Normal pulses.   Pulmonary:      Effort: Pulmonary effort is normal. No respiratory distress.   Musculoskeletal:         General: Swelling and tenderness present. No deformity or signs of injury. Normal range of motion.      Cervical back: Normal range of motion and neck supple.      Right lower leg: No edema.      Left lower leg: No edema.   Skin:     General: Skin is warm and dry.      Capillary Refill: Capillary refill takes less than 2 seconds.      Findings: No bruising or erythema.   Neurological:      General: No focal deficit present.      Mental Status: She is alert and oriented to person, place, and time.   Psychiatric:         Mood and Affect: Mood normal.         Behavior: Behavior normal.        Hand:    Examination L Digit(s) R Digit(s)   1st CMC Tenderness -  -    1st CMC Grind -  -    Bouchard Nodes -  -  Heberden Nodes -  -    A1 Pulley Tenderness -  + Th   Triggering -  +    UCL Instability -  -    RCL Instability -  -    Lateral Stress Pain -  -    Palmar Cords -  -    Tabletop test -  -    Garrod's Pads -  -    Grip Strength       Pinch Strength         ROM: Full        Imaging:     None indicated      Impression     Diagnosis Orders   1. Trigger thumb, right thumb  INJECT TENDON SHEATH/LIGAMENT    triamcinolone acetonide (KENALOG) injection 5 mg            Plan:     Right  trigger thumb injection    Return if symptoms worsen or fail to improve.     Plan was reviewed with patient, who verbalized agreement and understanding of the plan    [facility]  [facility] [place]  [address], [address]  [place] [address]   OFFICE PROCEDURE PROGRESS NOTE        Chart reviewed for the following:   I, Demara Lover A Celestia Duva, DO, have reviewed the History, Physical and updated the Allergic reactions for Mary [name]     TIME OUT performed immediately prior to start of procedure:   I, Mary Shoals, DO, have performed the following reviews on Mary [name] prior to the start of the procedure:            * Patient was identified by name and date of birth   * Agreement on procedure being performed was verified  * Risks and Benefits explained to the patient  * Procedure site verified and marked as necessary  * Patient was positioned for comfort  * Consent was signed and verified     Time: 09:31 AM      Date of procedure: 11/08/2021    Procedure performed by:  Mary Shoals, DO    Provider assisted by: Rosendo Gros, MA    Patient assisted by: self    How tolerated by patient: tolerated    Post Procedural Pain Scale:0    Comments: none    Procedure:  After consent was obtained, using sterile technique the right thumb was prepped. Local anesthetic used: 1% Lidocaine Kenalog 5 mg and was then injected and the needle withdrawn.  The procedure was well tolerated.  The patient is asked to continue to rest the area for a few more days before resuming regular activities.  It may be more painful for the first 1-2 days.  Watch for fever, or increased swelling or persistent pain in the joint. Call or return to clinic prn if such symptoms occur or there is failure to improve as anticipated.     Note: This note was completed using voice recognition software.  Any typographical/name errors or mistakes are unintentional.

## 2021-11-12 LAB — CARDIAC EVENT/MCOT MONITOR: Body Surface Area: 1.9 m2

## 2021-11-15 LAB — PROTIME-INR: INR: 4.1

## 2021-11-17 NOTE — Patient Instructions (Signed)
[name] is here today for anticoagulation monitoring for the diagnosis of atrial fibrillation.  Her INR goal is 2.5-3.5 and her current Coumadin dose is 10 mg on Tuesday & Friday/7.5 mg all other days.     Today's findings include an INR of 4.1     Considering Mary [name] past history, todays findings, and per the coumadin policy/protocol, Mary [name] was instructed to take Coumadin as follows,  7.5 mg daily.  She was also instructed to come back in 1 weeks for an INR check.    A full discussion of the nature of anticoagulants has been carried out.  A full discussion of the need for frequent and regular monitoring, precise dosage adjustment and compliance was stressed.  Side effects of potential bleeding were discussed and Mary [name] was instructed to call [phone] if there are any signs of abnormal bleeding.  Mary [name] was instructed to avoid any OTC items containing aspirin or ibuprofen and prior to starting any new OTC products to consult with her physician or pharmacist to ensure no drug interactions are present.  Mary [name] was instructed to avoid any major changes in her general diet and to avoid alcohol consumption.  .      Mary [name] verbalized her understanding of all instructions and will call the office with any questions, concerns, or signs of abnormal bleeding or blood clot.

## 2021-11-19 ENCOUNTER — Ambulatory Visit: Payer: MEDICARE | Attending: Family | Primary: Family Medicine

## 2021-11-19 NOTE — Progress Notes (Unsigned)
HISTORY OF PRESENT ILLNESS  Mary [name] is a 56 y.o. female.    PMH History of Mitral valve replacement Bioprosthetic, CHF, Bipolar, Anxiety, Left atrial clip,   -----  CARDIAC STUDIES  -----  2d tte 08/06/2021    Left Ventricle: Normal left ventricular systolic function with a visually estimated EF of 55 - 60%. Left ventricle is mildly dilated. Increased wall thickness. See diagram for wall motion findings. Diastolic function present with increased LAP with normal LV EF.    Right Ventricle: Right ventricle is mildly dilated. RVIDd is 4.3 cm.    Aortic Valve: Moderate regurgitation with a centrally directed jet. Vena contracta measures 0.5 cm.    Mitral Valve: Bioprosthetic valve that is well-seated with normal leaflet motion. MV mean gradient is 7 mmHg. Moderate regurgitation.    Left Atrium: Left atrium is mildly dilated. Left atrial volume index is mildly increased (35-41 mL/m2). LA Vol Index A/L is 38 mL/m2.    Pulmonary Arteries: Pulmonary hypertension not present. The estimated PASP is 30 mmHg.  -----  LHC 04/2020  Mild single vessel coronary artery disease with preserved LV function.  Elevated LVEDP.  Recommend intense risk factor modification.  -----  Vascular duplex upper extremity venous left   Acute non-occlusive deep vein thrombosis in 1 of 2 brachial veins within the left upper extremity.   Superficial acute non-occlusive thrombosis in the cephalic and basilic veins within the left upper extremity.  ----  Nuclear stress 08/2021  Low Risk Stress Test  No significant resting or reversible defects present  Normal ejection fraction 74% without significant TID 0.88  SSS = 0, SRS = 2, SDS = -2  ----  Cardiac heart monitor 09/24/2021  Patient monitored for 14d 12h starting on 09/25/2021 09:59 am.  Primary rhythm was Sinus Rhythm. Average heart rate was 62 bpm, Minimum heart rate was 43 bpm on Day 11  / 08:06:09 am, Max heart rate was 139 bpm on Day 7 / 04:57:50 pm  Atrial Fibrillation or Flutter: Burden was 0  %, longest event 0 ms on --, fastest rate -- bpm on   SVE(s): Burden was 0.06 %, max count per 24 hours 56  SVT (AT, RT): 43 events, longest event 11 beats on Day 5 / 05:09:52 am, fastest event 169 bpm on Day 1 /  04:48:18 pm  Pause: 1 events, longest pause 2000 ms on Day 11 / 08:06:15 am  AV Block: 0 %, most severe block demonstrated --  PVC(s): Burden was < 0.01 %, max count per 24 hours 3, 2 disparate morphologies  Ventricular Tachycardia: 0 events, longest event 0 s at --, fastest rate -- bpm at --  Patient recorded 1 events during the monitoring period Occurred with sinus bradycardia and artifact 58bpm  ----   Patient states that she gets upset and can get some chest discomfort, reports that trying to get to a psychiatrist.  Patient states that usually sharp.  Sometimes comes in the back but states that does not feel like muskuloskeletal.  There is a correlate with blood pressure elevation.  Reports that at least mild with exertion sometimes but restricted with back brace.  reports some swelling in the left leg, easy bruiser.  Denies blood clots.  Denies PND, denies fatigue, reports some chronic shortness of breath with minimal exertion, denies orthopnea, reports palpitations.  Reviewed with patient remains as such but now reports palpitations.      Family History   Problem Relation Age of Onset  No Known Problems Mother        Past Medical History:   Diagnosis Date    Anxiety     Bipolar 1 disorder (HCC)     CHF (congestive heart failure) (HCC)     COPD (chronic obstructive pulmonary disease) (HCC)     DDD (degenerative disc disease), cervical     GERD (gastroesophageal reflux disease)     H/O aortic valve replacement     History of transient ischemic attack (TIA)     Mitral regurgitation and mitral stenosis     PTSD (post-traumatic stress disorder)     Scoliosis     Vitamin D deficiency        Past Surgical History:   Procedure Laterality Date    CARDIAC CATHETERIZATION      CARDIAC VALVE SURGERY       mitral valve replacement    CESAREAN SECTION      HAND/FINGER SURGERY UNLISTED Right     I&D right middle finger    HYSTERECTOMY (CERVIX STATUS UNKNOWN)      KNEE SURGERY      OVARY REMOVAL         Social History     Tobacco Use    Smoking status: Every Day     Packs/day: 1.00     Types: Cigarettes    Smokeless tobacco: Never   Substance Use Topics    Alcohol use: Never       Allergies   Allergen Reactions    Alprazolam Other (See Comments)     Gets suicidal    Diazepam Other (See Comments)     Gets suicidal    Levofloxacin Other (See Comments)     Muscle stiffness and bulging muscles     Tobacco     Adhesive Tape Rash    Lithium Nausea And Vomiting       Prior to Admission medications    Medication Sig Start Date End Date Taking? Authorizing Provider   pregabalin (LYRICA) 200 MG capsule Take 1 capsule by mouth 3 times daily for 180 days. 10/15/21 04/13/22  Valetta Fuller, APRN - NP   LORazepam (ATIVAN) 0.5 MG tablet Take 1 tablet by mouth every 6 hours as needed for Anxiety.    Historical Provider, MD   ranolazine (RANEXA) 500 MG extended release tablet Take 1 tablet by mouth 2 times daily 09/24/21   Sandre Kitty, MD   warfarin (COUMADIN) 2.5 MG tablet Take 1 tablet by mouth daily 09/14/21   Nicanor Bake, APRN - NP   citalopram (CELEXA) 20 MG tablet Take 1 tablet by mouth daily 08/27/21   Rowan Blase, MD   pravastatin (PRAVACHOL) 40 MG tablet Take 1 tablet by mouth daily 08/27/21   Rowan Blase, MD   metoprolol succinate (TOPROL XL) 25 MG extended release tablet Take 1 tablet by mouth daily 08/27/21   Rowan Blase, MD   omeprazole (PRILOSEC) 40 MG delayed release capsule Take 1 capsule by mouth daily 08/27/21   Rowan Blase, MD   albuterol-ipratropium (COMBIVENT RESPIMAT) 20-100 MCG/ACT AERS inhaler Inhale 1 puff into the lungs 4 times daily 08/27/21   Rowan Blase, MD   lamoTRIgine (LAMICTAL) 25 MG tablet Take 1 tablet by mouth in the morning, at noon, and at bedtime  08/27/21   Rowan Blase, MD   nicotine (NICODERM CQ) 14 MG/24HR Place 1 patch onto the skin daily 08/27/21   Rowan Blase, MD   vitamin  B-12 (CYANOCOBALAMIN) 1000 MCG tablet Take 1 tablet by mouth daily    Historical Provider, MD   albuterol sulfate HFA (PROVENTIL;VENTOLIN;PROAIR) 108 (90 Base) MCG/ACT inhaler Inhale 2 puffs into the lungs every 6 hours as needed 11/19/19   Ar Automatic Reconciliation   aspirin 81 MG chewable tablet Take 1 tablet by mouth daily    Ar Automatic Reconciliation   cetirizine (ZYRTEC) 10 MG tablet Take 1 tablet by mouth daily 10/23/19   Ar Automatic Reconciliation   docusate (COLACE, DULCOLAX) 100 MG CAPS Col-Rite 100 mg capsule   take 1 capsule by mouth twice a day if needed for constipation    Ar Automatic Reconciliation   fluticasone (FLONASE) 50 MCG/ACT nasal spray 1 spray by nasal route daily 05/09/20   Ar Automatic Reconciliation   furosemide (LASIX) 40 MG tablet Take 1 tablet by mouth daily as needed    Ar Automatic Reconciliation       No results found for: LIPIDPAN, BMP, CMP     There were no vitals taken for this visit.    HPI    Review of Systems   Constitutional:  Negative for activity change, appetite change, diaphoresis, fatigue and unexpected weight change.   Eyes:  Negative for visual disturbance.   Respiratory:  Positive for chest tightness and shortness of breath. Negative for apnea, cough and wheezing.    Cardiovascular:  Negative for chest pain, palpitations and leg swelling.   Gastrointestinal:  Negative for abdominal pain, blood in stool, constipation, diarrhea and nausea.   Endocrine: Negative for cold intolerance and heat intolerance.   Genitourinary:  Negative for decreased urine volume and difficulty urinating.   Musculoskeletal:  Negative for gait problem, myalgias and neck pain.   Skin:  Negative for color change, pallor and rash.   Neurological:  Negative for dizziness, syncope, facial asymmetry, speech difficulty, weakness, light-headedness  and numbness.   Psychiatric/Behavioral:  Negative for confusion and sleep disturbance.        Physical Exam  Vitals reviewed.   Constitutional:       General: She is not in acute distress.     Appearance: Normal appearance. She is not ill-appearing or diaphoretic.   HENT:      Head: Normocephalic and atraumatic.   Eyes:      General: No scleral icterus.        Right eye: No discharge.         Left eye: No discharge.      Conjunctiva/sclera: Conjunctivae normal.   Neck:      Vascular: No carotid bruit.   Cardiovascular:      Rate and Rhythm: Normal rate and regular rhythm.      Heart sounds: Normal heart sounds. No murmur heard.    No friction rub. No gallop.   Pulmonary:      Effort: Pulmonary effort is normal. No respiratory distress.      Breath sounds: Normal breath sounds. No wheezing, rhonchi or rales.   Chest:      Chest wall: No tenderness.   Abdominal:      General: Bowel sounds are normal. There is no distension.      Palpations: Abdomen is soft.      Tenderness: There is no abdominal tenderness. There is no guarding.   Musculoskeletal:         General: No swelling or tenderness.      Cervical back: Neck supple.      Right lower leg: No edema.  Left lower leg: No edema.   Skin:     General: Skin is warm and dry.      Findings: No erythema or rash.   Neurological:      Mental Status: She is alert. Mental status is at baseline.      Motor: No weakness.      Gait: Gait normal.   Psychiatric:         Mood and Affect: Mood normal.         Behavior: Behavior normal.         Thought Content: Thought content normal.       ASSESSMENT and PLAN  Ms. Isais has a reminder for a "due or due soon" health maintenance. I have asked that she contact her primary care provider for follow-up on this health maintenance.     HLD - Continue pravastatin 40mg  po daily, survey lipid panel periodically.       Atrial fibrillation - On coumadin, requests refill, Check for primary prevention of stroke.  Check heart monitor for 2  weeks evaluate with reports palpitations, and also may be source of chest discomfort RVR if occurs.       HTN - Normotensive today, metoprolol succinate 25mg  po daily,      Chronic heart failure preserved ejection fraction - Continue jardiance 10mg  po daily, monitor fluid status, adjust as necessary, fluid restrication 2L/day, salt intake <2g per day. Currently on Lasix 40 mg p.o. daily as needed weight gain 2 to 3 pounds     Chest pain -   Low risk stress test 08/2021 but with continued smoking, sent ranexa 500mg  po bid, sent Nitro PRN. Patient advised that if increasing frequency duration and intensity of discomfort or unrelieved discomfort to call EMS to visit emergency department.     Moderate aortic regurgitation - Survey periodically 1-2 years.      Mitral valve bioprosthetic with mean gradient 7 mmHg 08/02/2021 - Survey periodically 1-2 years.      Moderate mitral regurgitation - Survey periodically 1-2 years.

## 2021-11-25 ENCOUNTER — Encounter

## 2021-11-27 ENCOUNTER — Emergency Department: Admit: 2021-11-28 | Payer: MEDICARE | Primary: Family Medicine

## 2021-11-27 DIAGNOSIS — S20211A Contusion of right front wall of thorax, initial encounter: Secondary | ICD-10-CM

## 2021-11-27 DIAGNOSIS — R079 Chest pain, unspecified: Secondary | ICD-10-CM

## 2021-11-27 NOTE — ED Provider Notes (Signed)
EMERGENCY DEPARTMENT HISTORY AND PHYSICAL EXAM      Date: 11/27/2021  Patient Name: Mary [name]    History of Presenting Illness     No chief complaint on file.      Patient is a 56 year old female with past medical history of hyperlipidemia, hypertension, asthma, diabetes, mitral valve replacement bio prosthetic valve presented emergency department for evaluation of chest pain.  Patient states that she hurt her right sided ribs coughing last week and has been having pain especially with deep breaths.  She denies fevers or chills, abdominal pain, NVD        PCP: Lenn Sink, MD    Current Facility-Administered Medications   Medication Dose Route Frequency Provider Last Rate Last Admin    lidocaine 4 % external patch 1 patch  1 patch TransDERmal NOW Sherre Scarlet, DO   1 patch at 11/27/21 2301    nitroGLYCERIN (NITROSTAT) SL tablet 0.4 mg  0.4 mg SubLINGual Q5 Min PRN Sandre Kitty, MD         Current Outpatient Medications   Medication Sig Dispense Refill    HYDROcodone-acetaminophen (NORCO) 5-325 MG per tablet Take 1 tablet by mouth every 6 hours as needed for Pain for up to 3 days. Intended supply: 3 days. Take lowest dose possible to manage pain Max Daily Amount: 4 tablets 10 tablet 0    pregabalin (LYRICA) 200 MG capsule Take 1 capsule by mouth 3 times daily for 180 days. 90 capsule 5    LORazepam (ATIVAN) 0.5 MG tablet Take 1 tablet by mouth every 6 hours as needed for Anxiety.      ranolazine (RANEXA) 500 MG extended release tablet Take 1 tablet by mouth 2 times daily 60 tablet 3    warfarin (COUMADIN) 2.5 MG tablet Take 1 tablet by mouth daily 90 tablet 1    citalopram (CELEXA) 20 MG tablet Take 1 tablet by mouth daily 30 tablet 0    pravastatin (PRAVACHOL) 40 MG tablet Take 1 tablet by mouth daily 30 tablet 0    metoprolol succinate (TOPROL XL) 25 MG extended release tablet Take 1 tablet by mouth daily 30 tablet 0    omeprazole (PRILOSEC) 40 MG delayed release capsule Take 1 capsule by mouth  daily 30 capsule 0    albuterol-ipratropium (COMBIVENT RESPIMAT) 20-100 MCG/ACT AERS inhaler Inhale 1 puff into the lungs 4 times daily 1 each 0    lamoTRIgine (LAMICTAL) 25 MG tablet Take 1 tablet by mouth in the morning, at noon, and at bedtime 90 tablet 0    nicotine (NICODERM CQ) 14 MG/24HR Place 1 patch onto the skin daily 30 patch 0    vitamin B-12 (CYANOCOBALAMIN) 1000 MCG tablet Take 1 tablet by mouth daily      albuterol sulfate HFA (PROVENTIL;VENTOLIN;PROAIR) 108 (90 Base) MCG/ACT inhaler Inhale 2 puffs into the lungs every 6 hours as needed      aspirin 81 MG chewable tablet Take 1 tablet by mouth daily      cetirizine (ZYRTEC) 10 MG tablet Take 1 tablet by mouth daily      docusate (COLACE, DULCOLAX) 100 MG CAPS Col-Rite 100 mg capsule   take 1 capsule by mouth twice a day if needed for constipation      fluticasone (FLONASE) 50 MCG/ACT nasal spray 1 spray by nasal route daily      furosemide (LASIX) 40 MG tablet Take 1 tablet by mouth daily as needed         Past  History     Past Medical History:  Past Medical History:   Diagnosis Date    Anxiety     Bipolar 1 disorder (HCC)     CHF (congestive heart failure) (HCC)     COPD (chronic obstructive pulmonary disease) (HCC)     DDD (degenerative disc disease), cervical     GERD (gastroesophageal reflux disease)     H/O aortic valve replacement     History of transient ischemic attack (TIA)     Mitral regurgitation and mitral stenosis     PTSD (post-traumatic stress disorder)     Scoliosis     Vitamin D deficiency        Past Surgical History:  Past Surgical History:   Procedure Laterality Date    CARDIAC CATHETERIZATION      CARDIAC VALVE SURGERY      mitral valve replacement    CESAREAN SECTION      HAND/FINGER SURGERY UNLISTED Right     I&D right middle finger    HYSTERECTOMY (CERVIX STATUS UNKNOWN)      KNEE SURGERY      OVARY REMOVAL         Family History:  Family History   Problem Relation Age of Onset    No Known Problems Mother        Social  History:  Social History     Tobacco Use    Smoking status: Every Day     Packs/day: 1.00     Types: Cigarettes    Smokeless tobacco: Never   Vaping Use    Vaping Use: Never used   Substance Use Topics    Alcohol use: Never    Drug use: Yes     Types: Marijuana Sheran Fava)       Allergies:  Allergies   Allergen Reactions    Alprazolam Other (See Comments)     Gets suicidal    Diazepam Other (See Comments)     Gets suicidal    Levofloxacin Other (See Comments)     Muscle stiffness and bulging muscles     Tobacco     Adhesive Tape Rash    Lithium Nausea And Vomiting         Review of Systems       Review of Systems   Constitutional:  Negative for activity change, fatigue and fever.   Respiratory:  Negative for chest tightness and shortness of breath.    Cardiovascular:  Positive for chest pain.   Gastrointestinal:  Negative for abdominal pain, diarrhea, nausea and vomiting.   Musculoskeletal:  Negative for arthralgias and myalgias.   Skin:  Negative for rash and wound.   Neurological:  Negative for dizziness, weakness, light-headedness, numbness and headaches.   Psychiatric/Behavioral:  Negative for agitation.        Physical Exam   BP 127/77   Pulse 87   Temp 98.1 F (36.7 C)   Ht 5\' 3"  (1.6 m)   Wt 180 lb (81.6 kg)   SpO2 98%   BMI 31.89 kg/m       Physical Exam  Constitutional:       General: She is not in acute distress.     Appearance: She is not ill-appearing.   HENT:      Head: Normocephalic and atraumatic.      Mouth/Throat:      Mouth: Mucous membranes are moist.   Eyes:      Extraocular Movements: Extraocular movements intact.  Pupils: Pupils are equal, round, and reactive to light.   Cardiovascular:      Rate and Rhythm: Normal rate and regular rhythm.   Pulmonary:      Effort: Pulmonary effort is normal.      Breath sounds: Normal breath sounds. No wheezing, rhonchi or rales.   Chest:      Chest wall: Tenderness (to palpation of right chest) present.   Abdominal:      General: Abdomen is flat.       Palpations: Abdomen is soft.      Tenderness: There is no abdominal tenderness.   Musculoskeletal:         General: No swelling or deformity. Normal range of motion.      Cervical back: Normal range of motion and neck supple.   Skin:     General: Skin is warm and dry.      Capillary Refill: Capillary refill takes less than 2 seconds.   Neurological:      General: No focal deficit present.      Mental Status: She is alert and oriented to person, place, and time.      Cranial Nerves: No cranial nerve deficit.      Sensory: No sensory deficit.      Motor: No weakness.   Psychiatric:         Mood and Affect: Mood normal.         Diagnostic Study Results     Labs -  Recent Results (from the past 12 hour(s))   CBC with Auto Differential    Collection Time: 11/27/21 10:17 PM   Result Value Ref Range    WBC 8.8 4.6 - 13.2 K/uL    RBC 4.57 4.20 - 5.30 M/uL    Hemoglobin 13.4 12.0 - 16.0 g/dL    Hematocrit 62.3 76.2 - 45.0 %    MCV 89.1 78.0 - 100.0 FL    MCH 29.3 24.0 - 34.0 PG    MCHC 32.9 31.0 - 37.0 g/dL    RDW 83.1 51.7 - 61.6 %    Platelets 244 135 - 420 K/uL    MPV 10.1 9.2 - 11.8 FL    Nucleated RBCs 0.0 0 PER 100 WBC    nRBC 0.00 0.00 - 0.01 K/uL    Neutrophils % 49 40 - 73 %    Lymphocytes % 42 21 - 52 %    Monocytes % 6 3 - 10 %    Eosinophils % 3 0 - 5 %    Basophils % 1 0 - 2 %    Immature Granulocytes 0 0.0 - 0.5 %    Neutrophils Absolute 4.3 1.8 - 8.0 K/UL    Lymphocytes Absolute 3.7 (H) 0.9 - 3.6 K/UL    Monocytes Absolute 0.5 0.05 - 1.2 K/UL    Eosinophils Absolute 0.3 0.0 - 0.4 K/UL    Basophils Absolute 0.1 0.0 - 0.1 K/UL    Absolute Immature Granulocyte 0.0 0.00 - 0.04 K/UL    Differential Type AUTOMATED     Basic Metabolic Panel    Collection Time: 11/27/21 10:17 PM   Result Value Ref Range    Sodium 142 136 - 145 mmol/L    Potassium 4.1 3.5 - 5.5 mmol/L    Chloride 113 (H) 100 - 111 mmol/L    CO2 25 21 - 32 mmol/L    Anion Gap 4 3.0 - 18 mmol/L    Glucose 106 (H) 74 - 99 mg/dL  BUN 14 7.0 - 18 MG/DL     Creatinine 4.13 0.6 - 1.3 MG/DL    Bun/Cre Ratio 14 12 - 20      Est, Glom Filt Rate >60 >60 ml/min/1.1m2    Calcium 8.8 8.5 - 10.1 MG/DL   Troponin    Collection Time: 11/27/21 10:17 PM   Result Value Ref Range    Troponin, High Sensitivity 4 0 - 54 ng/L   D-Dimer, Quantitative    Collection Time: 11/27/21 10:17 PM   Result Value Ref Range    D-Dimer, Quant 0.28 <0.46 ug/ml(FEU)       Radiologic Studies -   XR CHEST 1 VIEW    (Results Pending)           Medical Decision Making   I am the first provider for this patient.    I reviewed the vital signs, available nursing notes, past medical history, past surgical history, family history and social history.      Vital Signs-Reviewed the patient's vital signs.    EKG: Normal sinus rhythm.  No STEMI.  As interpreted by me.    ED Course: Progress Notes, Reevaluation, and Consults:    Provider Notes (Medical Decision Making):       MDM  Number of Diagnoses or Management Options  Chest pain, unspecified type  Contusion of rib on right side, initial encounter  Diagnosis management comments: DDX: Including but not limited to ACS, CHF exacerbation, rib contusion, COPD/asthma exacerbation, pneumonia, viral URI, PE, pneumothorax    Patient in no acute distress.  Vital signs stable.  She is 100% on room air.  She does have tenderness to palpation of the right-sided chest but no evidence of any flail chest or instability.  Screening lab work unremarkable.  Troponin is negative.  D-dimer is negative.  Chest x-ray showing no evidence of acute fracture or pneumothorax.  Patient treated with lidocaine patches and Toradol with improvement of pain.  Will discharge home at this time with Norco prescription.    DISCHARGE NOTE:  Results of today's workup have been reviewed with the patient. Patient has been counseled regarding her diagnosis, treatment, and plan.  Patient verbally conveys understanding and agreement of the signs, symptoms, diagnosis, treatment and prognosis and  additionally agrees to follow up as discussed.  Patient also agrees with the care-plan and conveys that all of their questions have been answered.  I have also provided discharge instructionsthat include: educational information regarding their diagnosis and treatment, and list of reasons why they would want to return to the ED prior to their follow-up appointment, should their condition change. This has been provided with education for proper emergency department utilization.                     Procedures          Diagnosis     Clinical Impression:   1. Chest pain, unspecified type    2. Contusion of rib on right side, initial encounter        Disposition: discharged    Disclaimer: Sections of this note are dictated using utilizing voice recognition software.  Minor typographical errors may be present. If questions arise, please do not hesitate to contact me or call our department.         Vida Roller Margurite Duffy, DO  11/28/21 0000

## 2021-11-28 ENCOUNTER — Inpatient Hospital Stay
Admit: 2021-11-28 | Discharge: 2021-11-28 | Disposition: A | Discharge status: Home / Self Care | Payer: MEDICARE | Attending: Emergency Medicine

## 2021-11-28 LAB — CBC WITH AUTO DIFFERENTIAL
Basophils %: 1 % (ref 0–2)
Basophils Absolute: 0.1 10*3/uL (ref 0.0–0.1)
Eosinophils %: 3 % (ref 0–5)
Eosinophils Absolute: 0.3 10*3/uL (ref 0.0–0.4)
Hematocrit: 40.7 % (ref 35.0–45.0)
Hemoglobin: 13.4 g/dL (ref 12.0–16.0)
Immature Granulocytes %: 0 % (ref 0.0–0.5)
Immature Granulocytes Absolute: 0 10*3/uL (ref 0.00–0.04)
Lymphocytes %: 42 % (ref 21–52)
Lymphocytes Absolute: 3.7 10*3/uL — ABNORMAL HIGH (ref 0.9–3.6)
MCH: 29.3 PG (ref 24.0–34.0)
MCHC: 32.9 g/dL (ref 31.0–37.0)
MCV: 89.1 FL (ref 78.0–100.0)
MPV: 10.1 FL (ref 9.2–11.8)
Monocytes %: 6 % (ref 3–10)
Monocytes Absolute: 0.5 10*3/uL (ref 0.05–1.2)
Neutrophils %: 49 % (ref 40–73)
Neutrophils Absolute: 4.3 10*3/uL (ref 1.8–8.0)
Nucleated RBCs: 0 PER 100 WBC
Platelets: 244 10*3/uL (ref 135–420)
RBC: 4.57 M/uL (ref 4.20–5.30)
RDW: 14.4 % (ref 11.6–14.5)
WBC: 8.8 10*3/uL (ref 4.6–13.2)
nRBC: 0 10*3/uL (ref 0.00–0.01)

## 2021-11-28 LAB — BASIC METABOLIC PANEL
Anion Gap: 4 mmol/L (ref 3.0–18)
BUN/Creatinine Ratio: 14 (ref 12–20)
BUN: 14 MG/DL (ref 7.0–18)
CO2: 25 mmol/L (ref 21–32)
Calcium: 8.8 MG/DL (ref 8.5–10.1)
Chloride: 113 mmol/L — ABNORMAL HIGH (ref 100–111)
Creatinine: 1 MG/DL (ref 0.6–1.3)
Est, Glom Filt Rate: >60 mL/min/{1.73_m2} (ref >60)
Glucose: 106 mg/dL — ABNORMAL HIGH (ref 74–99)
Potassium: 4.1 mmol/L (ref 3.5–5.5)
Sodium: 142 mmol/L (ref 136–145)

## 2021-11-28 LAB — TROPONIN: Troponin, High Sensitivity: 4 ng/L (ref 0–54)

## 2021-11-28 LAB — D-DIMER, QUANTITATIVE: D-Dimer, Quant: 0.28 ug/ml(FEU) (ref <0.46)

## 2021-11-28 MED ORDER — LIDOCAINE 4 % EX PTCH
4 % | CUTANEOUS | Status: DC
Start: 2021-11-28 — End: 2021-11-28
  Administered 2021-11-28: 03:00:00 1 via TRANSDERMAL

## 2021-11-28 MED ORDER — KETOROLAC TROMETHAMINE 15 MG/ML IJ SOLN
15 MG/ML | Freq: Once | INTRAMUSCULAR | Status: AC
Start: 2021-11-28 — End: 2021-11-27
  Administered 2021-11-28: 03:00:00 15 mg via INTRAVENOUS

## 2021-11-28 MED ORDER — HYDROCODONE-ACETAMINOPHEN 5-325 MG PO TABS
5-325 MG | ORAL_TABLET | Freq: Four times a day (QID) | ORAL | 0 refills | Status: AC | PRN
Start: 2021-11-28 — End: 2021-11-30

## 2021-11-28 MED ORDER — HYDROCODONE-ACETAMINOPHEN 5-325 MG PO TABS
5-325 MG | ORAL | Status: AC
Start: 2021-11-28 — End: 2021-11-28
  Administered 2021-11-28: 04:00:00 1 via ORAL

## 2021-11-28 MED FILL — HYDROCODONE-ACETAMINOPHEN 5-325 MG PO TABS: 5-325 MG | ORAL | Qty: 1

## 2021-11-28 MED FILL — KETOROLAC TROMETHAMINE 15 MG/ML IJ SOLN: 15 MG/ML | INTRAMUSCULAR | Qty: 1

## 2021-11-28 MED FILL — LIDOCAINE PAIN RELIEF 4 % EX PTCH: 4 % | CUTANEOUS | Qty: 1

## 2021-11-29 LAB — PROTIME-INR: INR: 2.2

## 2021-11-29 LAB — EKG 12-LEAD
Atrial Rate: 70 {beats}/min
Diagnosis: NORMAL
P Axis: 68 degrees
P-R Interval: 134 ms
Q-T Interval: 404 ms
QRS Duration: 84 ms
QTc Calculation (Bazett): 436 ms
R Axis: 54 degrees
T Axis: 62 degrees
Ventricular Rate: 70 {beats}/min

## 2021-11-29 NOTE — Patient Instructions (Signed)
swp

## 2021-11-30 ENCOUNTER — Ambulatory Visit
Admit: 2021-11-30 | Discharge: 2021-11-30 | Payer: MEDICARE | Attending: Student in an Organized Health Care Education/Training Program | Primary: Family Medicine

## 2021-11-30 DIAGNOSIS — G43009 Migraine without aura, not intractable, without status migrainosus: Secondary | ICD-10-CM

## 2021-11-30 MED ORDER — TOPIRAMATE 25 MG PO TABS
25 MG | ORAL_TABLET | Freq: Two times a day (BID) | ORAL | 3 refills | Status: AC
Start: 2021-11-30 — End: 2022-03-24

## 2021-11-30 MED ORDER — RIMEGEPANT SULFATE 75 MG PO TBDP
75 | ORAL_TABLET | ORAL | 3 refills | Status: DC | PRN
Start: 2021-11-30 — End: 2021-12-10

## 2021-11-30 NOTE — Progress Notes (Signed)
Mary [name] is a 55 y.o. female new patient in office today to discuss headaches; as referred by Erik Obey, PA.    Patient reports no active headaches at this time, but left rib pain 6-7/10.

## 2021-11-30 NOTE — Progress Notes (Signed)
Mary [name] is a 56 y.o. female .presents for New Patient and Headache      A 56 years old female patient with medical history of bipolar disorder, CHF, COPD, degenerative disc disease, PTSD referred here for evaluation of headache.  She said she had been having headaches since her teenage years.  When she was younger, she used to have severe recurrent headaches.  Headaches went away for years.  Restarted last year.  Current frequency is twice a week.  Headache might last for about 4 hours with Tylenol.  Mostly on the left side; starts around the occipital area and goes to the front.  Throbbing and severe.  Associated with photophobia and phonophobia.  Has occasional nausea.  No vomiting.  Tylenol helps for acute attacks.  She mentioned that she is in a lot of stress.  Currently lives with her god daughter; she rented room.  Has blurring of vision; needs glasses.  No diplopia.  Has generalized weakness.  Had previous cervical fusion surgery.  She has multifocal pain.  Had history of TIA about 3 years ago; was not able to talk.  Resolved spontaneously.        Review of Systems   Constitutional:  Positive for unexpected weight change. Negative for chills and fever.   HENT:  Positive for hearing loss. Negative for tinnitus.    Eyes:  Positive for visual disturbance.   Respiratory:  Positive for cough (in the morning) and shortness of breath.    Cardiovascular:  Positive for chest pain (rib pain on the left).   Gastrointestinal:  Positive for nausea. Negative for vomiting.   Genitourinary:  Negative for dysuria, frequency and urgency.   Musculoskeletal:  Positive for arthralgias, back pain and neck pain.   Skin:  Negative for rash.   Neurological:  Positive for weakness (generalized), numbness (in hands) and headaches. Negative for dizziness, tremors, seizures, syncope, facial asymmetry and speech difficulty.   Psychiatric/Behavioral:  Negative for suicidal ideas. The patient is nervous/anxious (withe depression).         Past Medical History:   Diagnosis Date    Anxiety     Bipolar 1 disorder (HCC)     CHF (congestive heart failure) (HCC)     COPD (chronic obstructive pulmonary disease) (HCC)     DDD (degenerative disc disease), cervical     GERD (gastroesophageal reflux disease)     H/O aortic valve replacement     History of transient ischemic attack (TIA)     Mitral regurgitation and mitral stenosis     PTSD (post-traumatic stress disorder)     Scoliosis     Vitamin D deficiency         Past Surgical History:   Procedure Laterality Date    CARDIAC CATHETERIZATION      CARDIAC VALVE SURGERY      mitral valve replacement    CESAREAN SECTION      HAND/FINGER SURGERY UNLISTED Right     I&D right middle finger    HYSTERECTOMY (CERVIX STATUS UNKNOWN)      KNEE SURGERY      OVARY REMOVAL          Family History   Problem Relation Age of Onset    No Known Problems Mother         Social History     Socioeconomic History    Marital status: Divorced     Spouse name: Not on file    Number of children: Not on file  Years of education: Not on file    Highest education level: Not on file   Occupational History    Not on file   Tobacco Use    Smoking status: Every Day     Packs/day: 0.75     Types: Cigarettes     Passive exposure: Current    Smokeless tobacco: Never   Vaping Use    Vaping Use: Never used   Substance and Sexual Activity    Alcohol use: Never    Drug use: Yes     Types: Marijuana Sherrie Mustache)    Sexual activity: Not on file   Other Topics Concern    Not on file   Social History Narrative    Not on file     Social Determinants of Health     Financial Resource Strain: Not on file   Food Insecurity: Not on file   Transportation Needs: Not on file   Physical Activity: Not on file   Stress: Not on file   Social Connections: Not on file   Intimate Partner Violence: Not on file   Housing Stability: Not on file        Allergies   Allergen Reactions    Lithium Nausea And Vomiting     Vomiting      Alprazolam Other (See Comments)     Gets  suicidal    Dermatitis Antigen      Rash and blisers  Rash and blisers    Diazepam Other (See Comments)     Gets suicidal    Levofloxacin Other (See Comments)     Muscle stiffness and bulging muscles     Tobacco Other (See Comments)     Increasing lung deterioration     Adhesive Tape Rash     blisters         Current Outpatient Medications   Medication Sig Dispense Refill    Acetaminophen (TYLENOL ARTHRITIS PAIN PO) Take by mouth      topiramate (TOPAMAX) 25 MG tablet Take 1 tablet by mouth 2 times daily 60 tablet 3    Rimegepant Sulfate 75 MG TBDP Take 75 mg by mouth as needed (Migriane) Take 1 tab at onset of headache; not to be taken more than ocne in 24 hours; not more than 2 in a week. 8 tablet 3    pregabalin (LYRICA) 200 MG capsule Take 1 capsule by mouth 3 times daily for 180 days. 90 capsule 5    warfarin (COUMADIN) 2.5 MG tablet Take 1 tablet by mouth daily 90 tablet 1    citalopram (CELEXA) 20 MG tablet Take 1 tablet by mouth daily 30 tablet 0    metoprolol succinate (TOPROL XL) 25 MG extended release tablet Take 1 tablet by mouth daily 30 tablet 0    omeprazole (PRILOSEC) 40 MG delayed release capsule Take 1 capsule by mouth daily 30 capsule 0    vitamin B-12 (CYANOCOBALAMIN) 1000 MCG tablet Take 1 tablet by mouth daily      albuterol sulfate HFA (PROVENTIL;VENTOLIN;PROAIR) 108 (90 Base) MCG/ACT inhaler Inhale 2 puffs into the lungs every 6 hours as needed      aspirin 81 MG chewable tablet Take 1 tablet by mouth daily      cetirizine (ZYRTEC) 10 MG tablet Take 1 tablet by mouth daily      docusate (COLACE, DULCOLAX) 100 MG CAPS Col-Rite 100 mg capsule   take 1 capsule by mouth twice a day if needed for constipation  fluticasone (FLONASE) 50 MCG/ACT nasal spray 1 spray by nasal route daily      furosemide (LASIX) 40 MG tablet Take 1 tablet by mouth daily as needed      LORazepam (ATIVAN) 0.5 MG tablet Take 1 tablet by mouth every 6 hours as needed for Anxiety. (Patient not taking: Reported on  11/30/2021)      ranolazine (RANEXA) 500 MG extended release tablet Take 1 tablet by mouth 2 times daily (Patient not taking: Reported on 11/30/2021) 60 tablet 3    pravastatin (PRAVACHOL) 40 MG tablet Take 1 tablet by mouth daily (Patient not taking: Reported on 11/30/2021) 30 tablet 0    albuterol-ipratropium (COMBIVENT RESPIMAT) 20-100 MCG/ACT AERS inhaler Inhale 1 puff into the lungs 4 times daily (Patient not taking: Reported on 11/30/2021) 1 each 0     Current Facility-Administered Medications   Medication Dose Route Frequency Provider Last Rate Last Admin    nitroGLYCERIN (NITROSTAT) SL tablet 0.4 mg  0.4 mg SubLINGual Q5 Min PRN Gaston Islam, MD           Physical Exam  Constitutional:       Appearance: Normal appearance.   HENT:      Head: Normocephalic and atraumatic.      Mouth/Throat:      Mouth: Mucous membranes are moist.      Pharynx: Oropharynx is clear.   Eyes:      Extraocular Movements: Extraocular movements intact.      Pupils: Pupils are equal, round, and reactive to light.   Pulmonary:      Effort: Pulmonary effort is normal. No respiratory distress.   Musculoskeletal:         General: Normal range of motion.      Cervical back: Normal range of motion and neck supple.      Right lower leg: No edema.      Left lower leg: No edema.   Neurological:      Mental Status: She is alert.      Comments: Mental status: Awake, alert, oriented, follows simple and complex commands, no neglect, no extinction.  Speech and languge: fluent, coherent, and comprehension intact  CN: VFF, EOMI, PERRLA, face sensation intact , no facial asymmetry noted, palate elevation symmetric bilat, SS+SCM 5/5 bilat, tongue midline  Motor: no pronator drift, tone normal throughout, strength 5/5 throughout  Sensory: Decreased light touch and pinprick over the left side of the face and left upper extremity.  Coordination: FNF accurate w/o dysmetria  DTR: 2+ throughout, toes downgoing BL  Gait: Antalgic otherwise normal.         Anti-coag visit on 11/29/2021   Component Date Value Ref Range Status    INR 11/29/2021 2.20   Final   Admission on 11/27/2021, Discharged on 11/28/2021   Component Date Value Ref Range Status    WBC 11/27/2021 8.8  4.6 - 13.2 K/uL Final    RBC 11/27/2021 4.57  4.20 - 5.30 M/uL Final    Hemoglobin 11/27/2021 13.4  12.0 - 16.0 g/dL Final    Hematocrit 11/27/2021 40.7  35.0 - 45.0 % Final    MCV 11/27/2021 89.1  78.0 - 100.0 FL Final    MCH 11/27/2021 29.3  24.0 - 34.0 PG Final    MCHC 11/27/2021 32.9  31.0 - 37.0 g/dL Final    RDW 11/27/2021 14.4  11.6 - 14.5 % Final    Platelets 11/27/2021 244  135 - 420 K/uL Final    MPV 11/27/2021 10.1  9.2 - 11.8 FL Final    Nucleated RBCs 11/27/2021 0.0  0 PER 100 WBC Final    nRBC 11/27/2021 0.00  0.00 - 0.01 K/uL Final    Neutrophils % 11/27/2021 49  40 - 73 % Final    Lymphocytes % 11/27/2021 42  21 - 52 % Final    Monocytes % 11/27/2021 6  3 - 10 % Final    Eosinophils % 11/27/2021 3  0 - 5 % Final    Basophils % 11/27/2021 1  0 - 2 % Final    Immature Granulocytes 11/27/2021 0  0.0 - 0.5 % Final    Neutrophils Absolute 11/27/2021 4.3  1.8 - 8.0 K/UL Final    Lymphocytes Absolute 11/27/2021 3.7 (H)  0.9 - 3.6 K/UL Final    Monocytes Absolute 11/27/2021 0.5  0.05 - 1.2 K/UL Final    Eosinophils Absolute 11/27/2021 0.3  0.0 - 0.4 K/UL Final    Basophils Absolute 11/27/2021 0.1  0.0 - 0.1 K/UL Final    Absolute Immature Granulocyte 11/27/2021 0.0  0.00 - 0.04 K/UL Final    Differential Type 11/27/2021 AUTOMATED    Final    Sodium 11/27/2021 142  136 - 145 mmol/L Final    Potassium 11/27/2021 4.1  3.5 - 5.5 mmol/L Final    SLIGHTLY HEMOLYZED SPECIMEN    Chloride 11/27/2021 113 (H)  100 - 111 mmol/L Final    CO2 11/27/2021 25  21 - 32 mmol/L Final    Anion Gap 11/27/2021 4  3.0 - 18 mmol/L Final    Glucose 11/27/2021 106 (H)  74 - 99 mg/dL Final    BUN 11/27/2021 14  7.0 - 18 MG/DL Final    Creatinine 11/27/2021 1.00  0.6 - 1.3 MG/DL Final    Bun/Cre Ratio 11/27/2021 14  12 -  20   Final    Est, Glom Filt Rate 11/27/2021 >60  >60 ml/min/1.54m Final    Comment:    Pediatric calculator link: https://www.kidney.org/professionals/kdoqi/gfr_calculatorped     These results are not intended for use in patients <158years of age.     eGFR results are calculated without a race factor using  the 2021 CKD-EPI equation. Careful clinical correlation is recommended, particularly when comparing to results calculated using previous equations.  The CKD-EPI equation is less accurate in patients with extremes of muscle mass, extra-renal metabolism of creatinine, excessive creatine ingestion, or following therapy that affects renal tubular secretion.      Calcium 11/27/2021 8.8  8.5 - 10.1 MG/DL Final    Troponin, High Sensitivity 11/27/2021 4  0 - 54 ng/L Final    Comment: A HS troponin value change of (+ or -) 50% or more below the 99th percentile, in a 1/2/3 hr interval represents a significant change. Clinical correlation is recommended.  A HS troponin value change of (+ or -) 20% or above the 99th percentile, in a 1/2/3 hr interval represents a significant change. Clinical correlation is recommended.  99th Percentile:    Women:  0-54 ng/L                                                               Men:  0-78 ng/L      D-Dimer, Quant 11/27/2021 0.28  <0.46 ug/ml(FEU) Final  Comment: (NOTE)  A D-Dimer result less than 0.5 ug/mL FEU combined with a low clinical   pretest probability of DVT and/or PE has a negative predictive value   of 90-100%.  The positive predictive value is 50% or less.      Ventricular Rate 11/27/2021 70  BPM Final    Atrial Rate 11/27/2021 70  BPM Final    P-R Interval 11/27/2021 134  ms Final    QRS Duration 11/27/2021 84  ms Final    Q-T Interval 11/27/2021 404  ms Final    QTc Calculation (Bazett) 11/27/2021 436  ms Final    P Axis 11/27/2021 68  degrees Final    R Axis 11/27/2021 54  degrees Final    T Axis 11/27/2021 62  degrees Final    Diagnosis 11/27/2021    Final                     Value:Normal sinus rhythm  ST & T wave abnormality, consider anterior ischemia  Abnormal ECG  When compared with ECG of 02-Oct-2021 16:47,  No significant change was found  Confirmed by Newcastle Pink, M.D., [address] [phone]) on 11/28/2021 8:00:01 PM     Anti-coag visit on 11/15/2021   Component Date Value Ref Range Status    INR 11/13/2021 4.10   Final   Anti-coag visit on 11/08/2021   Component Date Value Ref Range Status    INR 11/08/2021 3.00   Final   Anti-coag visit on 11/02/2021   Component Date Value Ref Range Status    INR 11/02/2021 2.50   Final   Anti-coag visit on 10/26/2021   Component Date Value Ref Range Status    INR 10/26/2021 1.90   Final   Anti-coag visit on 10/19/2021   Component Date Value Ref Range Status    INR 10/19/2021 2.40   Final   Anti-coag visit on 10/12/2021   Component Date Value Ref Range Status    INR 10/12/2021 1.30   Final   Anti-coag visit on 10/04/2021   Component Date Value Ref Range Status    INR 10/02/2021 1.40   Final   Admission on 10/02/2021, Discharged on 10/03/2021   Component Date Value Ref Range Status    Ventricular Rate 10/02/2021 58  BPM Final    Atrial Rate 10/02/2021 58  BPM Final    P-R Interval 10/02/2021 166  ms Final    QRS Duration 10/02/2021 96  ms Final    Q-T Interval 10/02/2021 454  ms Final    QTc Calculation (Bazett) 10/02/2021 445  ms Final    P Axis 10/02/2021 92  degrees Final    R Axis 10/02/2021 49  degrees Final    T Axis 10/02/2021 63  degrees Final    Diagnosis 10/02/2021    Final                    Value:Sinus bradycardia  Minimal voltage criteria for LVH, may be normal variant  T wave abnormality, consider anterior ischemia  Abnormal ECG  When compared with ECG of 31-Aug-2021 09:10,  No significant change was found  Confirmed by Ailene Rud ([id number]) on 10/03/2021 5:46:54 PM      WBC 10/02/2021 6.2  4.6 - 13.2 K/uL Final    RBC 10/02/2021 4.39  4.20 - 5.30 M/uL Final    Hemoglobin 10/02/2021 12.8  12.0 - 16.0 g/dL Final    Hematocrit  10/02/2021 39.4  35.0 - 45.0 % Final  MCV 10/02/2021 89.7  78.0 - 100.0 FL Final    MCH 10/02/2021 29.2  24.0 - 34.0 PG Final    MCHC 10/02/2021 32.5  31.0 - 37.0 g/dL Final    RDW 10/02/2021 14.1  11.6 - 14.5 % Final    Platelets 10/02/2021 224  135 - 420 K/uL Final    MPV 10/02/2021 10.6  9.2 - 11.8 FL Final    Nucleated RBCs 10/02/2021 0.0  0 PER 100 WBC Final    nRBC 10/02/2021 0.00  0.00 - 0.01 K/uL Final    Neutrophils % 10/02/2021 37 (L)  40 - 73 % Final    Lymphocytes % 10/02/2021 52  21 - 52 % Final    Monocytes % 10/02/2021 6  3 - 10 % Final    Eosinophils % 10/02/2021 3  0 - 5 % Final    Basophils % 10/02/2021 1  0 - 2 % Final    Immature Granulocytes 10/02/2021 0  0.0 - 0.5 % Final    Neutrophils Absolute 10/02/2021 2.3  1.8 - 8.0 K/UL Final    Lymphocytes Absolute 10/02/2021 3.2  0.9 - 3.6 K/UL Final    Monocytes Absolute 10/02/2021 0.4  0.05 - 1.2 K/UL Final    Eosinophils Absolute 10/02/2021 0.2  0.0 - 0.4 K/UL Final    Basophils Absolute 10/02/2021 0.1  0.0 - 0.1 K/UL Final    Absolute Immature Granulocyte 10/02/2021 0.0  0.00 - 0.04 K/UL Final    Differential Type 10/02/2021 AUTOMATED    Final    Sodium 10/02/2021 140  136 - 145 mmol/L Final    Potassium 10/02/2021 4.4  3.5 - 5.5 mmol/L Final    SLIGHTLY HEMOLYZED SPECIMEN    Chloride 10/02/2021 110  100 - 111 mmol/L Final    CO2 10/02/2021 27  21 - 32 mmol/L Final    Anion Gap 10/02/2021 3  3.0 - 18 mmol/L Final    Glucose 10/02/2021 90  74 - 99 mg/dL Final    BUN 10/02/2021 14  7.0 - 18 MG/DL Final    Creatinine 10/02/2021 0.96  0.6 - 1.3 MG/DL Final    Bun/Cre Ratio 10/02/2021 15  12 - 20   Final    Est, Glom Filt Rate 10/02/2021 >60  >60 ml/min/1.21m Final    Comment:      Pediatric calculator link: https://www.kidney.org/professionals/kdoqi/gfr_calculatorped       These results are not intended for use in patients <121years of age.       eGFR results are calculated without a race factor using  the 2021 CKD-EPI equation. Careful clinical  correlation is recommended, particularly when comparing to results calculated using previous equations.  The CKD-EPI equation is less accurate in patients with extremes of muscle mass, extra-renal metabolism of creatinine, excessive creatine ingestion, or following therapy that affects renal tubular secretion.      Calcium 10/02/2021 8.7  8.5 - 10.1 MG/DL Final    Troponin, High Sensitivity 10/02/2021 5  0 - 54 ng/L Final    Comment: A HS troponin value change of (+ or -) 50% or more below the 99th percentile, in a 1/2/3 hr interval represents a significant change. Clinical correlation is recommended.  A HS troponin value change of (+ or -) 20% or above the 99th percentile, in a 1/2/3 hr interval represents a significant change. Clinical correlation is recommended.  99th Percentile:    Women:  0-54 ng/L  Men:  0-78 ng/L      NT Pro-BNP 10/02/2021 174  0 - 900 PG/ML Final    Comment:           For patients with dyspnea, NT proBNP is highly sensitive for detecting acute congestive heart failure. Also, an NT proBNP <300 pg/mL effectively rules out acute congestive heart failure, with 99% negative predictive value. Our reference ranges are for acute dyspnea.       Age              Range (pg/ml)        0-49                0-450        50-75               0-900        >75                 0-1800       For ambulatory office patients, the ranges below apply:  For patients with dyspnea, NT proBNP is highly sensitive for detecting acute congestive heart failure. Also, an NT proBNP <300 pg/mL effectively rules out acute congestive heart failure, with 99% negative predictive value. Our reference ranges are for acute dyspnea.      Magnesium 10/02/2021 2.2  1.6 - 2.6 mg/dL Final    SLIGHTLY HEMOLYZED SPECIMEN    Protime 10/02/2021 16.4 (H)  11.5 - 15.2 sec Final    INR 10/02/2021 1.3 (H)  0.8 - 1.2   Final    Comment:            INR Therapeutic Ranges         (on stable  oral anticoagulant):     INDICATION                INR  DVT/PE/Atrial Fib          2.0-3.0  MI/Mechanical Heart Valve  2.5-3.5      Lipase 10/02/2021 102  73 - 393 U/L Final    Troponin, High Sensitivity 10/02/2021 6  0 - 54 ng/L Final    Comment: A HS troponin value change of (+ or -) 50% or more below the 99th percentile, in a 1/2/3 hr interval represents a significant change. Clinical correlation is recommended.  A HS troponin value change of (+ or -) 20% or above the 99th percentile, in a 1/2/3 hr interval represents a significant change. Clinical correlation is recommended.  99th Percentile:    Women:  0-54 ng/L                                                               Men:  0-78 ng/L      Troponin, High Sensitivity 10/02/2021 6  0 - 54 ng/L Final    Comment: A HS troponin value change of (+ or -) 50% or more below the 99th percentile, in a 1/2/3 hr interval represents a significant change. Clinical correlation is recommended.  A HS troponin value change of (+ or -) 20% or above the 99th percentile, in a 1/2/3 hr interval represents a significant change. Clinical correlation is recommended.  99th Percentile:    Women:  0-54 ng/L  Men:  0-78 ng/L     There may be more visits with results that are not included.       Assessment:  1. Migraine without aura and without status migrainosus, not intractable  - topiramate (TOPAMAX) 25 MG tablet; Take 1 tablet by mouth 2 times daily  Dispense: 60 tablet; Refill: 3  - Rimegepant Sulfate 75 MG TBDP; Take 75 mg by mouth as needed (Migriane) Take 1 tab at onset of headache; not to be taken more than ocne in 24 hours; not more than 2 in a week.  Dispense: 8 tablet; Refill: 3  - CT HEAD WO CONTRAST; Future     Headache description is migraine without aura.  Has been having headaches since the teenage years.  Was headache free for a long time but restarted about a year ago.  Currently has 2 headaches per week.   Headaches are severe; Tylenol helps.  Not on any preventatives currently.  She has been under a lot of stress.  She has left face and left upper extremity decree sensation; otherwise no focal neurodeficits.  We will get a CT scan of the head.  We will start her on topiramate 25 mg p.o. twice daily.  Since she has history of TIA, will avoid triptans.  We will start her on Nurtec 75 mg p.o. as needed; not to be taken more than once in 24 hours and not more than 2 days a week.  Discussed the side effects of both medications.  Discussed the need for regular sleep, regular eating, hydration, and exercise.  We will see her again in 3 months time.      PLEASE NOTE:   This document has been produced using voice recognition software. Unrecognized errors in transcription may be present.

## 2021-12-02 NOTE — Telephone Encounter (Signed)
Patient has been out of warfarin x 4 days due to not being able to buy. Picked up this morning ans wants to know if she should she increase her dose?

## 2021-12-02 NOTE — Telephone Encounter (Signed)
Per patient current dose is 3 tabs of 2.5 mg nightly

## 2021-12-02 NOTE — Telephone Encounter (Signed)
Instructions given to patient

## 2021-12-02 NOTE — Telephone Encounter (Signed)
Take 12.5 mg  x 2 days, then 10 mg daiily recheck Monday.

## 2021-12-03 ENCOUNTER — Ambulatory Visit: Admit: 2021-12-03 | Discharge: 2021-12-03 | Payer: MEDICAID | Attending: Family | Primary: Family Medicine

## 2021-12-03 DIAGNOSIS — R079 Chest pain, unspecified: Secondary | ICD-10-CM

## 2021-12-03 MED ORDER — METOPROLOL SUCCINATE ER 50 MG PO TB24
50 MG | ORAL_TABLET | Freq: Every day | ORAL | 3 refills | Status: AC
Start: 2021-12-03 — End: 2022-05-23

## 2021-12-03 NOTE — Progress Notes (Signed)
HISTORY OF PRESENT ILLNESS  Mary [name] is a 56 y.o. female.    PMH History of Mitral valve replacement Bioprosthetic, CHF, Bipolar, Anxiety, Left atrial clip,   -----  CARDIAC STUDIES  -----  2d tte 08/06/2021    Left Ventricle: Normal left ventricular systolic function with a visually estimated EF of 55 - 60%. Left ventricle is mildly dilated. Increased wall thickness. See diagram for wall motion findings. Diastolic function present with increased LAP with normal LV EF.    Right Ventricle: Right ventricle is mildly dilated. RVIDd is 4.3 cm.    Aortic Valve: Moderate regurgitation with a centrally directed jet. Vena contracta measures 0.5 cm.    Mitral Valve: Bioprosthetic valve that is well-seated with normal leaflet motion. MV mean gradient is 7 mmHg. Moderate regurgitation.    Left Atrium: Left atrium is mildly dilated. Left atrial volume index is mildly increased (35-41 mL/m2). LA Vol Index A/L is 38 mL/m2.    Pulmonary Arteries: Pulmonary hypertension not present. The estimated PASP is 30 mmHg.  -----  LHC 04/2020  Mild single vessel coronary artery disease with preserved LV function.  Elevated LVEDP.  Recommend intense risk factor modification.  -----  Vascular duplex upper extremity venous left   Acute non-occlusive deep vein thrombosis in 1 of 2 brachial veins within the left upper extremity.   Superficial acute non-occlusive thrombosis in the cephalic and basilic veins within the left upper extremity.  ----  Nuclear stress 08/2021  Low Risk Stress Test  No significant resting or reversible defects present  Normal ejection fraction 74% without significant TID 0.88  SSS = 0, SRS = 2, SDS = -2    Cardiac event monitor  11/12/2021  Interpretation Summary    Patient monitored for 14d 12h starting on 09/25/2021 09:59 am.     Primary rhythm was Sinus Rhythm. Average heart rate was 62 bpm, Minimum heart rate was 43 bpm on Day 11  / 08:06:09 am, Max heart rate was 139 bpm on Day 7 / 04:57:50 pm     Atrial  Fibrillation or Flutter: Burden was 0 %, longest event 0 ms on --, fastest rate -- bpm on --.     SVE(s): Burden was 0.06 %, max count per 24 hours 56  SVT (AT, RT): 43 events, longest event 11 beats on Day 5 / 05:09:52 am, fastest event 169 bpm on Day 1 /  04:48:18 pm     Pause: 1 events, longest pause 2000 ms on Day 11 / 08:06:15 am  AV Block: 0 %, most severe block demonstrated --     PVC(s): Burden was < 0.01 %, max count per 24 hours 3, 2 disparate morphologies  Ventricular Tachycardia: 0 events, longest event 0 s at --, fastest rate -- bpm at --     Patient recorded 1 events during the monitoring period Occurred with sinus bradycardia and artifact     ----   Patient states that she gets upset and can get some chest discomfort, reports that trying to get to a psychiatrist.  Patient states that usually sharp.  Sometimes comes in the back but states that does not feel like muskuloskeletal.  There is a correlate with blood pressure elevation.  Reports that at least mild with exertion sometimes but restricted with back brace.  reports some swelling in the left leg, easy bruiser.  Denies blood clots.  Denies PND, denies fatigue, reports some chronic shortness of breath with minimal exertion, denies orthopnea, reports palpitations.  Reviewed  with patient remains as such but now reports palpitations.      Family History   Problem Relation Age of Onset    No Known Problems Mother        Past Medical History:   Diagnosis Date    Anxiety     Bipolar 1 disorder (HCC)     CHF (congestive heart failure) (HCC)     COPD (chronic obstructive pulmonary disease) (HCC)     DDD (degenerative disc disease), cervical     GERD (gastroesophageal reflux disease)     H/O aortic valve replacement     History of transient ischemic attack (TIA)     Mitral regurgitation and mitral stenosis     PTSD (post-traumatic stress disorder)     Scoliosis     Vitamin D deficiency        Past Surgical History:   Procedure Laterality Date    CARDIAC  CATHETERIZATION      CARDIAC VALVE SURGERY      mitral valve replacement    CESAREAN SECTION      HAND/FINGER SURGERY UNLISTED Right     I&D right middle finger    HYSTERECTOMY (CERVIX STATUS UNKNOWN)      KNEE SURGERY      OVARY REMOVAL         Social History     Tobacco Use    Smoking status: Every Day     Packs/day: 0.75     Types: Cigarettes     Passive exposure: Current    Smokeless tobacco: Never   Substance Use Topics    Alcohol use: Never       Allergies   Allergen Reactions    Lithium Nausea And Vomiting     Vomiting      Alprazolam Other (See Comments)     Gets suicidal    Dermatitis Antigen      Rash and blisers  Rash and blisers    Diazepam Other (See Comments)     Gets suicidal    Levofloxacin Other (See Comments)     Muscle stiffness and bulging muscles     Tobacco Other (See Comments)     Increasing lung deterioration     Adhesive Tape Rash     blisters       Prior to Admission medications    Medication Sig Start Date End Date Taking? Authorizing Provider   metoprolol succinate (TOPROL XL) 50 MG extended release tablet Take 1 tablet by mouth daily 12/03/21  Yes Baltazar Pekala G Zayden Hahne, APRN - NP   Acetaminophen (TYLENOL ARTHRITIS PAIN PO) Take by mouth   Yes Historical Provider, MD   topiramate (TOPAMAX) 25 MG tablet Take 1 tablet by mouth 2 times daily 11/30/21  Yes Belachew D Arasho, MD   Rimegepant Sulfate 75 MG TBDP Take 75 mg by mouth as needed (Migriane) Take 1 tab at onset of headache; not to be taken more than ocne in 24 hours; not more than 2 in a week. 11/30/21  Yes Belachew D Arasho, MD   pregabalin (LYRICA) 200 MG capsule Take 1 capsule by mouth 3 times daily for 180 days. 10/15/21 04/13/22 Yes Valetta Fuller, APRN - NP   LORazepam (ATIVAN) 0.5 MG tablet Take 1 tablet by mouth every 6 hours as needed for Anxiety.   Yes Historical Provider, MD   warfarin (COUMADIN) 2.5 MG tablet Take 1 tablet by mouth daily 09/14/21  Yes Layali Freund G Adonias Demore, APRN - NP   citalopram (CELEXA) 20  MG tablet Take 1 tablet by mouth daily  08/27/21  Yes Rowan Blase, MD   omeprazole (PRILOSEC) 40 MG delayed release capsule Take 1 capsule by mouth daily 08/27/21  Yes Rowan Blase, MD   vitamin B-12 (CYANOCOBALAMIN) 1000 MCG tablet Take 1 tablet by mouth daily   Yes Historical Provider, MD   albuterol sulfate HFA (PROVENTIL;VENTOLIN;PROAIR) 108 (90 Base) MCG/ACT inhaler Inhale 2 puffs into the lungs every 6 hours as needed 11/19/19  Yes Ar Automatic Reconciliation   aspirin 81 MG chewable tablet Take 1 tablet by mouth daily   Yes Ar Automatic Reconciliation   cetirizine (ZYRTEC) 10 MG tablet Take 1 tablet by mouth daily 10/23/19  Yes Ar Automatic Reconciliation   docusate (COLACE, DULCOLAX) 100 MG CAPS Col-Rite 100 mg capsule   take 1 capsule by mouth twice a day if needed for constipation   Yes Ar Automatic Reconciliation   fluticasone (FLONASE) 50 MCG/ACT nasal spray 1 spray by nasal route daily 05/09/20  Yes Ar Automatic Reconciliation   furosemide (LASIX) 40 MG tablet Take 1 tablet by mouth daily as needed   Yes Ar Automatic Reconciliation   ranolazine (RANEXA) 500 MG extended release tablet Take 1 tablet by mouth 2 times daily  Patient not taking: Reported on 11/30/2021 09/24/21   Sandre Kitty, MD   pravastatin (PRAVACHOL) 40 MG tablet Take 1 tablet by mouth daily  Patient not taking: Reported on 11/30/2021 08/27/21   Roslyn Smiling Reineke-Piper, MD       No results found for: LIPIDPAN, BMP, CMP     BP (!) 141/77   Pulse 56   Wt 179 lb (81.2 kg)   SpO2 97%   BMI 31.71 kg/m     12/03/2021  Patient seen in follow up for event monitor results.  She c/o mild left side "rib" pain and was recently treated in ER and PCP with Toradol injections, pain has resolved.  She denies shortness of breath or edema.  She c/o brief episodes of palpitations.       Review of Systems   Constitutional:  Negative for activity change, appetite change, diaphoresis, fatigue and unexpected weight change.   Eyes:  Negative for visual disturbance.   Respiratory:   Positive for chest tightness and shortness of breath. Negative for apnea, cough and wheezing.    Cardiovascular:  Negative for chest pain, palpitations and leg swelling.   Gastrointestinal:  Negative for abdominal pain, blood in stool, constipation, diarrhea and nausea.   Endocrine: Negative for cold intolerance and heat intolerance.   Genitourinary:  Negative for decreased urine volume and difficulty urinating.   Musculoskeletal:  Negative for gait problem, myalgias and neck pain.   Skin:  Negative for color change, pallor and rash.   Neurological:  Negative for dizziness, syncope, facial asymmetry, speech difficulty, weakness, light-headedness and numbness.   Psychiatric/Behavioral:  Negative for confusion and sleep disturbance.        Physical Exam  Vitals reviewed.   Constitutional:       General: She is not in acute distress.     Appearance: Normal appearance. She is not ill-appearing or diaphoretic.   HENT:      Head: Normocephalic and atraumatic.   Eyes:      General: No scleral icterus.        Right eye: No discharge.         Left eye: No discharge.      Conjunctiva/sclera: Conjunctivae normal.   Neck:  Vascular: No carotid bruit.   Cardiovascular:      Rate and Rhythm: Normal rate and regular rhythm.      Heart sounds: Normal heart sounds. No murmur heard.    No friction rub. No gallop.   Pulmonary:      Effort: Pulmonary effort is normal. No respiratory distress.      Breath sounds: Normal breath sounds. No wheezing, rhonchi or rales.   Chest:      Chest wall: No tenderness.   Abdominal:      General: Bowel sounds are normal. There is no distension.      Palpations: Abdomen is soft.      Tenderness: There is no abdominal tenderness. There is no guarding.   Musculoskeletal:         General: No swelling or tenderness.      Cervical back: Neck supple.      Right lower leg: No edema.      Left lower leg: No edema.   Skin:     General: Skin is warm and dry.      Findings: No erythema or rash.    Neurological:      Mental Status: She is alert. Mental status is at baseline.      Motor: No weakness.      Gait: Gait normal.   Psychiatric:         Mood and Affect: Mood normal.         Behavior: Behavior normal.         Thought Content: Thought content normal.       ASSESSMENT and PLAN  Ms. Salyers has a reminder for a "due or due soon" health maintenance. I have asked that she contact her primary care provider for follow-up on this health maintenance.     HLD - Continue pravastatin 40mg  po daily, survey lipid panel periodically.       Atrial fibrillation - On coumadin, requests refill, Check for primary prevention of stroke.  Check heart monitor for 2 weeks evaluate with reports palpitations, and also may be source of chest discomfort RVR if occurs.       HTN - Normotensive today, metoprolol succinate 25mg  po daily,      Chronic heart failure preserved ejection fraction - Continue jardiance 10mg  po daily, monitor fluid status, adjust as necessary, fluid restrication 2L/day, salt intake <2g per day. Currently on Lasix 40 mg p.o. daily as needed weight gain 2 to 3 pounds     Chest pain -   Low risk stress test 08/2021 but with continued smoking, sent ranexa 500mg  po bid, sent Nitro PRN. Patient advised that if increasing frequency duration and intensity of discomfort or unrelieved discomfort to call EMS to visit emergency department.     Moderate aortic regurgitation - Survey periodically 1-2 years.      Mitral valve bioprosthetic with mean gradient 7 mmHg 08/02/2021 - Survey periodically 1-2 years.      Moderate mitral regurgitation - Survey periodically 1-2 years.      12/03/2021  Seen in follow-up for palpitations event monitor reviewed and discussed with patient primary sinus rhythm no A-fib noted 43 episodes of SVT longest being 11 beats.  Will increase Toprol-XL to 50 mg/day.  Encourage smoking cessation  F/U with Dr. 09/2021 in 4 months or sooner if needed.

## 2021-12-03 NOTE — Progress Notes (Signed)
1. Have you been to the ER, urgent care clinic since your last visit?  Hospitalized since your last visit?     Yes When: 11/27/21 Where: [facility] Reason for visit: Chest Pain       2.  Where do you normally have your labs drawn?   [facility]    3. Do you need any refills today?   No

## 2021-12-05 ENCOUNTER — Emergency Department: Admit: 2021-12-05 | Payer: MEDICARE | Primary: Family Medicine

## 2021-12-05 ENCOUNTER — Inpatient Hospital Stay: Admit: 2021-12-05 | Discharge: 2021-12-05 | Disposition: A | Discharge status: Home / Self Care | Payer: MEDICARE

## 2021-12-05 DIAGNOSIS — M439 Deforming dorsopathy, unspecified: Secondary | ICD-10-CM

## 2021-12-05 LAB — CBC WITH AUTO DIFFERENTIAL
Absolute Immature Granulocyte: 0 10*3/uL (ref 0.00–0.04)
Basophils %: 0 % (ref 0–2)
Basophils Absolute: 0 10*3/uL (ref 0.0–0.1)
Eosinophils %: 2 % (ref 0–5)
Eosinophils Absolute: 0.1 10*3/uL (ref 0.0–0.4)
Hematocrit: 39.3 % (ref 35.0–45.0)
Hemoglobin: 12.9 g/dL (ref 12.0–16.0)
Immature Granulocytes: 0 % (ref 0.0–0.5)
Lymphocytes %: 37 % (ref 21–52)
Lymphocytes Absolute: 2.5 10*3/uL (ref 0.9–3.6)
MCH: 29.5 PG (ref 24.0–34.0)
MCHC: 32.8 g/dL (ref 31.0–37.0)
MCV: 89.9 FL (ref 78.0–100.0)
MPV: 9.9 FL (ref 9.2–11.8)
Monocytes %: 6 % (ref 3–10)
Monocytes Absolute: 0.4 10*3/uL (ref 0.05–1.2)
Neutrophils %: 55 % (ref 40–73)
Neutrophils Absolute: 3.8 10*3/uL (ref 1.8–8.0)
Nucleated RBCs: 0 PER 100 WBC
Platelets: 230 10*3/uL (ref 135–420)
RBC: 4.37 M/uL (ref 4.20–5.30)
RDW: 14.3 % (ref 11.6–14.5)
WBC: 6.8 10*3/uL (ref 4.6–13.2)
nRBC: 0 10*3/uL (ref 0.00–0.01)

## 2021-12-05 LAB — COMPREHENSIVE METABOLIC PANEL
ALT: 19 U/L (ref 13–56)
AST: 20 U/L (ref 10–38)
Albumin/Globulin Ratio: 1.4 (ref 0.8–1.7)
Albumin: 3.7 g/dL (ref 3.4–5.0)
Alk Phosphatase: 59 U/L (ref 45–117)
Anion Gap: 4 mmol/L (ref 3.0–18)
BUN: 11 MG/DL (ref 7.0–18)
Bun/Cre Ratio: 14 (ref 12–20)
CO2: 26 mmol/L (ref 21–32)
Calcium: 9 MG/DL (ref 8.5–10.1)
Chloride: 111 mmol/L (ref 100–111)
Creatinine: 0.77 MG/DL (ref 0.6–1.3)
Est, Glom Filt Rate: >60 mL/min/{1.73_m2} (ref >60)
Globulin: 2.7 g/dL (ref 2.0–4.0)
Glucose: 89 mg/dL (ref 74–99)
Potassium: 4.1 mmol/L (ref 3.5–5.5)
Sodium: 141 mmol/L (ref 136–145)
Total Bilirubin: 0.4 MG/DL (ref 0.2–1.0)
Total Protein: 6.4 g/dL (ref 6.4–8.2)

## 2021-12-05 LAB — TROPONIN
Troponin, High Sensitivity: 5 ng/L (ref 0–54)
Troponin, High Sensitivity: 4 ng/L (ref 0–54)

## 2021-12-05 LAB — MAGNESIUM: Magnesium: 2.1 mg/dL (ref 1.6–2.6)

## 2021-12-05 LAB — PROTIME-INR
INR: 1.7 — ABNORMAL HIGH (ref 0.8–1.2)
Protime: 20.4 s — ABNORMAL HIGH (ref 11.5–15.2)

## 2021-12-05 MED ORDER — ASPIRIN 81 MG PO CHEW
81 MG | ORAL | Status: AC
Start: 2021-12-05 — End: 2021-12-05
  Administered 2021-12-05: 20:00:00 162 mg via ORAL

## 2021-12-05 MED ORDER — MORPHINE SULFATE (PF) 2 MG/ML IV SOLN
2 MG/ML | INTRAVENOUS | Status: AC
Start: 2021-12-05 — End: 2021-12-05
  Administered 2021-12-05: 21:00:00 2 mg via INTRAVENOUS

## 2021-12-05 MED ORDER — IOPAMIDOL 76 % IV SOLN
76 % | Freq: Once | INTRAVENOUS | Status: AC | PRN
Start: 2021-12-05 — End: 2021-12-05
  Administered 2021-12-05: 19:00:00 80 mL via INTRAVENOUS

## 2021-12-05 MED FILL — MORPHINE SULFATE 2 MG/ML IJ SOLN: 2 mg/mL | INTRAMUSCULAR | Qty: 1

## 2021-12-05 MED FILL — ASPIRIN LOW DOSE 81 MG PO CHEW: 81 MG | ORAL | Qty: 2

## 2021-12-05 MED FILL — ISOVUE-370 76 % IV SOLN: 76 % | INTRAVENOUS | Qty: 80

## 2021-12-05 NOTE — ED Provider Notes (Signed)
[facility] EMERGENCY DEPT  EMERGENCY DEPARTMENT ENCOUNTER      Pt Name: Mary [name]  MRN: [id number]  Birthdate 01/04/1966  Date of evaluation: 12/05/2021  Provider: Arvella Merles, PA    CHIEF COMPLAINT       Chief Complaint   Patient presents with    Chest Pain         HISTORY OF PRESENT ILLNESS   (Location/Symptom, Timing/Onset, Context/Setting, Quality, Duration, Modifying Factors, Severity)  Note limiting factors.   Mary [name] is a 56 y.o. female who presents to the emergency department with left-sided chest pain intermittent since last night.  She has sharp pains.  She saw Dr. Hulda Humphrey on Friday for her Holter monitor discussion and was not having any pain at that time.  History of hypertension hypercholesterolemia and she smokes.  He was off of her Coumadin for 4 days because she could not afford the dollar 35 co-pay.  Is been back on Coumadin for 2 days.  She takes this medication for her valve replacement as well as her A-fib.    HPI    Nursing Notes were reviewed.    REVIEW OF SYSTEMS    (2-9 systems for level 4, 10 or more for level 5)     Review of Systems   Constitutional: Negative.    HENT: Negative.     Eyes: Negative.    Respiratory:  Negative for shortness of breath.    Cardiovascular:  Positive for chest pain.   Gastrointestinal:  Negative for blood in stool.   Endocrine: Negative.    Genitourinary: Negative.    Musculoskeletal: Negative.    Skin: Negative.    Allergic/Immunologic: Negative.    Neurological: Negative.    Hematological:  Bruises/bleeds easily.   Psychiatric/Behavioral: Negative.       Except as noted above the remainder of the review of systems was reviewed and negative.       PAST MEDICAL HISTORY     Past Medical History:   Diagnosis Date    Anxiety     Bipolar 1 disorder (HCC)     CHF (congestive heart failure) (HCC)     COPD (chronic obstructive pulmonary disease) (HCC)     DDD (degenerative disc disease), cervical     GERD (gastroesophageal reflux disease)     H/O aortic valve replacement      History of transient ischemic attack (TIA)     Mitral regurgitation and mitral stenosis     PTSD (post-traumatic stress disorder)     Scoliosis     Vitamin D deficiency          SURGICAL HISTORY       Past Surgical History:   Procedure Laterality Date    CARDIAC CATHETERIZATION      CARDIAC VALVE SURGERY      mitral valve replacement    CESAREAN SECTION      HAND/FINGER SURGERY UNLISTED Right     I&D right middle finger    HYSTERECTOMY (CERVIX STATUS UNKNOWN)      KNEE SURGERY      OVARY REMOVAL           CURRENT MEDICATIONS       Previous Medications    ACETAMINOPHEN (TYLENOL ARTHRITIS PAIN PO)    Take by mouth    ALBUTEROL SULFATE HFA (PROVENTIL;VENTOLIN;PROAIR) 108 (90 BASE) MCG/ACT INHALER    Inhale 2 puffs into the lungs every 6 hours as needed    ASPIRIN 81 MG CHEWABLE TABLET    Take 1  tablet by mouth daily    CETIRIZINE (ZYRTEC) 10 MG TABLET    Take 1 tablet by mouth daily    CITALOPRAM (CELEXA) 20 MG TABLET    Take 1 tablet by mouth daily    DOCUSATE (COLACE, DULCOLAX) 100 MG CAPS    Col-Rite 100 mg capsule   take 1 capsule by mouth twice a day if needed for constipation    FLUTICASONE (FLONASE) 50 MCG/ACT NASAL SPRAY    1 spray by nasal route daily    FUROSEMIDE (LASIX) 40 MG TABLET    Take 1 tablet by mouth daily as needed    LORAZEPAM (ATIVAN) 0.5 MG TABLET    Take 1 tablet by mouth every 6 hours as needed for Anxiety.    METOPROLOL SUCCINATE (TOPROL XL) 50 MG EXTENDED RELEASE TABLET    Take 1 tablet by mouth daily    OMEPRAZOLE (PRILOSEC) 40 MG DELAYED RELEASE CAPSULE    Take 1 capsule by mouth daily    PRAVASTATIN (PRAVACHOL) 40 MG TABLET    Take 1 tablet by mouth daily    PREGABALIN (LYRICA) 200 MG CAPSULE    Take 1 capsule by mouth 3 times daily for 180 days.    RANOLAZINE (RANEXA) 500 MG EXTENDED RELEASE TABLET    Take 1 tablet by mouth 2 times daily    RIMEGEPANT SULFATE 75 MG TBDP    Take 75 mg by mouth as needed (Migriane) Take 1 tab at onset of headache; not to be taken more than ocne in 24  hours; not more than 2 in a week.    TOPIRAMATE (TOPAMAX) 25 MG TABLET    Take 1 tablet by mouth 2 times daily    VITAMIN B-12 (CYANOCOBALAMIN) 1000 MCG TABLET    Take 1 tablet by mouth daily    WARFARIN (COUMADIN) 2.5 MG TABLET    Take 1 tablet by mouth daily       ALLERGIES     Lithium, Alprazolam, Dermatitis antigen, Diazepam, Levofloxacin, Tobacco, and Adhesive tape    FAMILY HISTORY       Family History   Problem Relation Age of Onset    No Known Problems Mother           SOCIAL HISTORY       Social History     Socioeconomic History    Marital status: Divorced   Tobacco Use    Smoking status: Every Day     Packs/day: 0.75     Types: Cigarettes     Passive exposure: Current    Smokeless tobacco: Never   Vaping Use    Vaping Use: Never used   Substance and Sexual Activity    Alcohol use: Never    Drug use: Yes     Types: Marijuana (Weed)       SCREENINGS         Glasgow Coma Scale  Eye Opening: Spontaneous  Best Verbal Response: Oriented  Best Motor Response: Obeys commands  Glasgow Coma Scale Score: 15                     CIWA Assessment  BP: (!) 141/72  Pulse: 53                 PHYSICAL EXAM    (up to 7 for level 4, 8 or more for level 5)     ED Triage Vitals   BP Temp Temp src Pulse Respirations SpO2 Height Weight - Scale  12/05/21 1310 12/05/21 1310 -- 12/05/21 1310 12/05/21 1314 12/05/21 1314 12/05/21 1310 12/05/21 1310   (!) 140/70 97.9 F (36.6 C)  50 14 94 % 5\' 3"  (1.6 m) 180 lb (81.6 kg)       Physical Exam  Constitutional:       General: She is not in acute distress.     Appearance: Normal appearance. She is normal weight. She is not toxic-appearing.   HENT:      Head: Normocephalic.      Nose: Nose normal.      Mouth/Throat:      Mouth: Mucous membranes are moist.   Eyes:      Extraocular Movements: Extraocular movements intact.   Cardiovascular:      Rate and Rhythm: Normal rate. Rhythm irregular.      Pulses: Normal pulses.      Heart sounds: Normal heart sounds.      Comments: Equal radial  pulses.  Pulmonary:      Effort: Pulmonary effort is normal.      Breath sounds: Normal breath sounds.   Chest:      Chest wall: No tenderness.   Abdominal:      General: Abdomen is flat.      Tenderness: There is no abdominal tenderness.   Musculoskeletal:         General: No deformity.      Cervical back: Normal range of motion and neck supple. No rigidity.   Skin:     General: Skin is warm.   Neurological:      Mental Status: She is alert and oriented to person, place, and time.   Psychiatric:         Mood and Affect: Mood normal.         Behavior: Behavior normal.         Thought Content: Thought content normal.         Judgment: Judgment normal.       DIAGNOSTIC RESULTS     EKG: All EKG's are interpreted by the Emergency Department Physician who either signs or Co-signs this chart in the absence of a cardiologist.    He shows sinus bradycardia with ventricular rate of 51 PR interval 152 QRS duration 94 and a QTc of 425 with T wave inversions in V2 and V3 but no change compared to July 15 EKG.  No ST elevations or depressions.  RADIOLOGY:   Non-plain film images such as CT, Ultrasound and MRI are read by the radiologist. Plain radiographic images are visualized and preliminarily interpreted by the emergency physician with the below findings:      Interpretation per the Radiologist below, if available at the time of this note:    CTA CHEST W WO CONTRAST   Final Result      No evidence of acute pulmonary embolism.      In addition to multiple nonacute compression fractures again seen, there is a   compression fracture now seen at T6, age indeterminate but new compared to the   CT of 10/02/2021. Clinical correlation might be helpful regarding tenderness on   exam at this level. Diffuse radiographic osteopenia.      Pulmonary emphysematous disease. Multiple lung findings again seen similar to   the preceding study as detailed above. No suspicious new lung lesion compared to   prior.      Additional nonacute findings  again seen as above.  ED BEDSIDE ULTRASOUND:   Performed by ED Physician - none    LABS:  Labs Reviewed   PROTIME-INR - Abnormal; Notable for the following components:       Result Value    Protime 20.4 (*)     INR 1.7 (*)     All other components within normal limits   CBC WITH AUTO DIFFERENTIAL   COMPREHENSIVE METABOLIC PANEL   TROPONIN   TROPONIN   MAGNESIUM       All other labs were within normal range or not returned as of this dictation.    EMERGENCY DEPARTMENT COURSE and DIFFERENTIAL DIAGNOSIS/MDM:   Vitals:    Vitals:    12/05/21 1348 12/05/21 1403 12/05/21 1418 12/05/21 1433   BP: (!) 144/70 131/71 (!) 143/69 (!) 141/72   Pulse: (!) 49 54 53 53   Resp: 13 16 17 19    Temp:       SpO2: 94% 97% 95% 96%   Weight:       Height:           Patient off her anticoagulation for 4 days INR is only 1.7.  Scan to rule out PE.    Medical Decision Making  Amount and/or Complexity of Data Reviewed  Labs: ordered.  Radiology: ordered.  ECG/medicine tests: ordered.    Risk  OTC drugs.  Prescription drug management.      Because patient's INR was subtherapeutic CTA was performed to rule out PE for source of her chest pain.  She in fact has no PE but she does have a new compression deformity at T6.  She already has an upper back Ortho/interventional radiology appointment scheduled for early August.  We will also give her ortho MDs Kerner's name for follow-up.  Does not need any pain medications that she says she already has extra strength Tylenol at home.  No arrhythmias that are new on EKG troponin is normal not anemic discharge.      REASSESSMENT      FINAL IMPRESSION      1. Compression deformity of vertebra    2. Chronic anticoagulation          DISPOSITION/PLAN   DISPOSITION Decision To Discharge 12/05/2021 04:56:04 PM      PATIENT REFERRED TO:  12/07/2021, MD  [address]  [place]  [phone]    Schedule an appointment as soon as possible for a visit in 1 day      [phone], MD  [address] [place]  [address]  [place]  [phone]    Schedule an appointment as soon as possible for a visit in 1 day      [facility] EMERGENCY DEPT  [address]  [place]  [phone]    If symptoms worsen or unable to obtain follow up, return immediately      DISCHARGE MEDICATIONS:  New Prescriptions    No medications on file     Controlled Substances Monitoring:     No flowsheet data found.    (Please note that portions of this note were completed with a voice recognition program.  Efforts were made to edit the dictations but occasionally words are mis-transcribed.)    [phone], PA (electronically signed)  Attending Emergency Physician           Arvella Merles, PA  12/05/21 1659

## 2021-12-05 NOTE — ED Triage Notes (Signed)
C/o sharp Intermittent l. Side cp x 1 day. Took ASA 324mg  daily, seen at cardialotgy office recently for same, no findings.

## 2021-12-05 NOTE — ED Notes (Signed)
OUT TO CT     Markeis Allman, RN  12/05/21 1433

## 2021-12-06 DIAGNOSIS — M546 Pain in thoracic spine: Secondary | ICD-10-CM

## 2021-12-06 LAB — EKG 12-LEAD
Atrial Rate: 51 {beats}/min
P Axis: 102 degrees
P-R Interval: 152 ms
Q-T Interval: 462 ms
QRS Duration: 94 ms
QTc Calculation (Bazett): 425 ms
R Axis: 34 degrees
T Axis: 46 degrees
Ventricular Rate: 51 {beats}/min

## 2021-12-06 MED ORDER — TRAMADOL HCL 50 MG PO TABS
50 MG | ORAL_TABLET | Freq: Four times a day (QID) | ORAL | 0 refills | Status: AC | PRN
Start: 2021-12-06 — End: 2021-12-09

## 2021-12-06 NOTE — ED Notes (Signed)
Pt c/o back pain and states she is feeling depressed due to her living situation. States the home is full of bugs. Pt states that she sometimes doesn't want to wake up in the morning.   Denies plan.       Vic Ripper, RN  12/06/21 2206

## 2021-12-06 NOTE — ED Provider Notes (Signed)
EMERGENCY DEPARTMENT HISTORY AND PHYSICAL EXAM      Date: 12/06/2021  Patient Name: Mary [name]    History of Presenting Illness     Chief Complaint   Patient presents with    Back Pain    Depression       56 year old female presenting to the emergency department via EMS with complaints of back pain and depression with suicidal thoughts.  Patient was seen here yesterday and diagnosed with compression fracture.  States that she was discharged with tramadol but this has not been really helping.  This is making her depressed.  States that sometimes she was she would not wake up.  Denies any numbness tingling or weakness.        PCP: Lenn Sink, MD    Current Facility-Administered Medications   Medication Dose Route Frequency Provider Last Rate Last Admin    nitroGLYCERIN (NITROSTAT) SL tablet 0.4 mg  0.4 mg SubLINGual Q5 Min PRN Sandre Kitty, MD         Current Outpatient Medications   Medication Sig Dispense Refill    traMADol (ULTRAM) 50 MG tablet Take 1 tablet by mouth every 6 hours as needed for Pain for up to 3 days. Intended supply: 3 days. Take lowest dose possible to manage pain Max Daily Amount: 200 mg 5 tablet 0    metoprolol succinate (TOPROL XL) 50 MG extended release tablet Take 1 tablet by mouth daily 90 tablet 3    Acetaminophen (TYLENOL ARTHRITIS PAIN PO) Take by mouth      topiramate (TOPAMAX) 25 MG tablet Take 1 tablet by mouth 2 times daily 60 tablet 3    Rimegepant Sulfate 75 MG TBDP Take 75 mg by mouth as needed (Migriane) Take 1 tab at onset of headache; not to be taken more than ocne in 24 hours; not more than 2 in a week. 8 tablet 3    pregabalin (LYRICA) 200 MG capsule Take 1 capsule by mouth 3 times daily for 180 days. 90 capsule 5    LORazepam (ATIVAN) 0.5 MG tablet Take 1 tablet by mouth every 6 hours as needed for Anxiety.      ranolazine (RANEXA) 500 MG extended release tablet Take 1 tablet by mouth 2 times daily (Patient not taking: Reported on 11/30/2021) 60 tablet 3     warfarin (COUMADIN) 2.5 MG tablet Take 1 tablet by mouth daily 90 tablet 1    citalopram (CELEXA) 20 MG tablet Take 1 tablet by mouth daily 30 tablet 0    pravastatin (PRAVACHOL) 40 MG tablet Take 1 tablet by mouth daily (Patient not taking: Reported on 11/30/2021) 30 tablet 0    omeprazole (PRILOSEC) 40 MG delayed release capsule Take 1 capsule by mouth daily 30 capsule 0    vitamin B-12 (CYANOCOBALAMIN) 1000 MCG tablet Take 1 tablet by mouth daily      albuterol sulfate HFA (PROVENTIL;VENTOLIN;PROAIR) 108 (90 Base) MCG/ACT inhaler Inhale 2 puffs into the lungs every 6 hours as needed      aspirin 81 MG chewable tablet Take 1 tablet by mouth daily      cetirizine (ZYRTEC) 10 MG tablet Take 1 tablet by mouth daily      docusate (COLACE, DULCOLAX) 100 MG CAPS Col-Rite 100 mg capsule   take 1 capsule by mouth twice a day if needed for constipation      fluticasone (FLONASE) 50 MCG/ACT nasal spray 1 spray by nasal route daily      furosemide (LASIX) 40 MG  tablet Take 1 tablet by mouth daily as needed         Past History     Past Medical History:  Past Medical History:   Diagnosis Date    Anxiety     Bipolar 1 disorder (HCC)     CHF (congestive heart failure) (HCC)     COPD (chronic obstructive pulmonary disease) (HCC)     DDD (degenerative disc disease), cervical     GERD (gastroesophageal reflux disease)     H/O aortic valve replacement     History of transient ischemic attack (TIA)     Mitral regurgitation and mitral stenosis     PTSD (post-traumatic stress disorder)     Scoliosis     Vitamin D deficiency        Past Surgical History:  Past Surgical History:   Procedure Laterality Date    CARDIAC CATHETERIZATION      CARDIAC VALVE SURGERY      mitral valve replacement    CESAREAN SECTION      HAND/FINGER SURGERY UNLISTED Right     I&D right middle finger    HYSTERECTOMY (CERVIX STATUS UNKNOWN)      KNEE SURGERY      OVARY REMOVAL         Family History:  Family History   Problem Relation Age of Onset    No Known  Problems Mother        Social History:  Social History     Tobacco Use    Smoking status: Every Day     Packs/day: 0.75     Types: Cigarettes     Passive exposure: Current    Smokeless tobacco: Never   Vaping Use    Vaping Use: Never used   Substance Use Topics    Alcohol use: Never    Drug use: Yes     Types: Marijuana Sheran Fava)       Allergies:  Allergies   Allergen Reactions    Lithium Nausea And Vomiting     Vomiting      Alprazolam Other (See Comments)     Gets suicidal    Dermatitis Antigen      Rash and blisers  Rash and blisers    Diazepam Other (See Comments)     Gets suicidal    Levofloxacin Other (See Comments)     Muscle stiffness and bulging muscles     Tobacco Other (See Comments)     Increasing lung deterioration     Adhesive Tape Rash     blisters         Review of Systems       Review of Systems   Constitutional:  Negative for activity change, fatigue and fever.   Respiratory:  Negative for chest tightness and shortness of breath.    Cardiovascular:  Negative for chest pain.   Gastrointestinal:  Negative for abdominal pain, diarrhea, nausea and vomiting.   Musculoskeletal:  Positive for back pain. Negative for arthralgias and myalgias.   Skin:  Negative for rash and wound.   Neurological:  Negative for dizziness, weakness, light-headedness, numbness and headaches.   Psychiatric/Behavioral:  Positive for dysphoric mood and suicidal ideas. Negative for agitation.        Physical Exam   BP (!) 156/83   Pulse 69   Temp 98.8 F (37.1 C)   Resp 18   Ht 5\' 3"  (1.6 m)   Wt 180 lb (81.6 kg)   SpO2 100%   BMI  31.89 kg/m       Physical Exam  Constitutional:       General: She is not in acute distress.     Appearance: She is not ill-appearing.   HENT:      Head: Normocephalic and atraumatic.      Mouth/Throat:      Mouth: Mucous membranes are moist.   Eyes:      Extraocular Movements: Extraocular movements intact.      Pupils: Pupils are equal, round, and reactive to light.   Cardiovascular:      Rate and  Rhythm: Normal rate and regular rhythm.   Pulmonary:      Effort: Pulmonary effort is normal.      Breath sounds: Normal breath sounds.   Abdominal:      General: Abdomen is flat.      Palpations: Abdomen is soft.      Tenderness: There is no abdominal tenderness.   Musculoskeletal:         General: Tenderness (midline midthoracic tenderness) present. No swelling or deformity. Normal range of motion.      Cervical back: Normal range of motion and neck supple.   Skin:     General: Skin is warm and dry.      Capillary Refill: Capillary refill takes less than 2 seconds.   Neurological:      General: No focal deficit present.      Mental Status: She is alert and oriented to person, place, and time.      Cranial Nerves: No cranial nerve deficit.      Sensory: No sensory deficit.      Motor: No weakness.   Psychiatric:      Comments: Depressed mood, suicidal thougts         Diagnostic Study Results     Labs -  Recent Results (from the past 12 hour(s))   CBC with Auto Differential    Collection Time: 12/06/21 10:07 PM   Result Value Ref Range    WBC 8.0 4.6 - 13.2 K/uL    RBC 4.65 4.20 - 5.30 M/uL    Hemoglobin 13.8 12.0 - 16.0 g/dL    Hematocrit 47.8 29.5 - 45.0 %    MCV 89.2 78.0 - 100.0 FL    MCH 29.7 24.0 - 34.0 PG    MCHC 33.3 31.0 - 37.0 g/dL    RDW 62.1 30.8 - 65.7 %    Platelets 224 135 - 420 K/uL    MPV 10.4 9.2 - 11.8 FL    Nucleated RBCs 0.0 0 PER 100 WBC    nRBC 0.00 0.00 - 0.01 K/uL    Neutrophils % 63 40 - 73 %    Lymphocytes % 28 21 - 52 %    Monocytes % 7 3 - 10 %    Eosinophils % 2 0 - 5 %    Basophils % 1 0 - 2 %    Immature Granulocytes 0 0.0 - 0.5 %    Neutrophils Absolute 5.1 1.8 - 8.0 K/UL    Lymphocytes Absolute 2.2 0.9 - 3.6 K/UL    Monocytes Absolute 0.5 0.05 - 1.2 K/UL    Eosinophils Absolute 0.1 0.0 - 0.4 K/UL    Basophils Absolute 0.0 0.0 - 0.1 K/UL    Absolute Immature Granulocyte 0.0 0.00 - 0.04 K/UL    Differential Type AUTOMATED     Basic Metabolic Panel    Collection Time: 12/06/21 10:07 PM    Result Value Ref  Range    Sodium 138 136 - 145 mmol/L    Potassium 3.4 (L) 3.5 - 5.5 mmol/L    Chloride 107 100 - 111 mmol/L    CO2 27 21 - 32 mmol/L    Anion Gap 4 3.0 - 18 mmol/L    Glucose 86 74 - 99 mg/dL    BUN 10 7.0 - 18 MG/DL    Creatinine 4.54 0.6 - 1.3 MG/DL    Bun/Cre Ratio 9 (L) 12 - 20      Est, Glom Filt Rate >60 >60 ml/min/1.84m2    Calcium 9.6 8.5 - 10.1 MG/DL   Ethanol    Collection Time: 12/06/21 10:07 PM   Result Value Ref Range    Ethanol Lvl 5 (H) 0 - 3 MG/DL       Radiologic Studies -   No orders to display           Medical Decision Making   I am the first provider for this patient.    I reviewed the vital signs, available nursing notes, past medical history, past surgical history, family history and social history.      Vital Signs-Reviewed the patient's vital signs.    EKG:     ED Course: Progress Notes, Reevaluation, and Consults:    Provider Notes (Medical Decision Making):       MDM  Number of Diagnoses or Management Options  Acute thoracic back pain, unspecified back pain laterality  Depression, unspecified depression type  Diagnosis management comments: Patient in no acute distress.  Vital signs stable.  No evidence of spinal cord injury cauda equina syndrome.  Patient's pain is likely due to known T6 compression fracture.  Patient treated with Norco here.  Crisis has evaluated the patient after she was medically cleared.  States that she would like discharged to follow-up with her spinal surgeon this upcoming Thursday.  She has been cleared by crisis to be discharged to follow-up outpatient.                Procedures          Diagnosis     Clinical Impression:   1. Acute thoracic back pain, unspecified back pain laterality    2. Depression, unspecified depression type        Disposition: discharged    Disclaimer: Sections of this note are dictated using utilizing voice recognition software.  Minor typographical errors may be present. If questions arise, please do not hesitate to  contact me or call our department.          Sherre Scarlet, DO  12/07/21 [phone]
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
## 2021-12-07 ENCOUNTER — Inpatient Hospital Stay
Admit: 2021-12-07 | Discharge: 2021-12-07 | Disposition: A | Discharge status: Home Health | Payer: MEDICARE | Attending: Emergency Medicine

## 2021-12-07 LAB — BASIC METABOLIC PANEL
Anion Gap: 4 mmol/L (ref 3.0–18)
BUN: 10 MG/DL (ref 7.0–18)
Bun/Cre Ratio: 9 — ABNORMAL LOW (ref 12–20)
CO2: 27 mmol/L (ref 21–32)
Calcium: 9.6 MG/DL (ref 8.5–10.1)
Chloride: 107 mmol/L (ref 100–111)
Creatinine: 1.06 MG/DL (ref 0.6–1.3)
Est, Glom Filt Rate: >60 mL/min/{1.73_m2} (ref >60)
Glucose: 86 mg/dL (ref 74–99)
Potassium: 3.4 mmol/L — ABNORMAL LOW (ref 3.5–5.5)
Sodium: 138 mmol/L (ref 136–145)

## 2021-12-07 LAB — CBC WITH AUTO DIFFERENTIAL
Absolute Immature Granulocyte: 0 10*3/uL (ref 0.00–0.04)
Basophils %: 1 % (ref 0–2)
Basophils Absolute: 0 10*3/uL (ref 0.0–0.1)
Eosinophils %: 2 % (ref 0–5)
Eosinophils Absolute: 0.1 10*3/uL (ref 0.0–0.4)
Hematocrit: 41.5 % (ref 35.0–45.0)
Hemoglobin: 13.8 g/dL (ref 12.0–16.0)
Immature Granulocytes: 0 % (ref 0.0–0.5)
Lymphocytes %: 28 % (ref 21–52)
Lymphocytes Absolute: 2.2 10*3/uL (ref 0.9–3.6)
MCH: 29.7 PG (ref 24.0–34.0)
MCHC: 33.3 g/dL (ref 31.0–37.0)
MCV: 89.2 FL (ref 78.0–100.0)
MPV: 10.4 FL (ref 9.2–11.8)
Monocytes %: 7 % (ref 3–10)
Monocytes Absolute: 0.5 10*3/uL (ref 0.05–1.2)
Neutrophils %: 63 % (ref 40–73)
Neutrophils Absolute: 5.1 10*3/uL (ref 1.8–8.0)
Nucleated RBCs: 0 PER 100 WBC
Platelets: 224 10*3/uL (ref 135–420)
RBC: 4.65 M/uL (ref 4.20–5.30)
RDW: 14.2 % (ref 11.6–14.5)
WBC: 8 10*3/uL (ref 4.6–13.2)
nRBC: 0 10*3/uL (ref 0.00–0.01)

## 2021-12-07 LAB — ETHANOL: Ethanol Lvl: 5 MG/DL — ABNORMAL HIGH (ref 0–3)

## 2021-12-07 MED ORDER — HYDROCODONE-ACETAMINOPHEN 5-325 MG PO TABS
5-325 MG | ORAL | Status: AC
Start: 2021-12-07 — End: 2021-12-06
  Administered 2021-12-07: 02:00:00 1 via ORAL

## 2021-12-07 MED FILL — HYDROCODONE-ACETAMINOPHEN 5-325 MG PO TABS: 5-325 MG | ORAL | Qty: 1

## 2021-12-07 NOTE — ED Notes (Signed)
Pt discharged via ambulatory to Home.  Pt Verbalized understanding of discharge instructions.   Vital signs stable.          Vic Ripper, RN  12/07/21 [phone]

## 2021-12-08 LAB — PROTIME-INR: INR: 3

## 2021-12-10 ENCOUNTER — Encounter

## 2021-12-10 MED ORDER — UBRELVY 100 MG PO TABS
100 MG | ORAL_TABLET | ORAL | 3 refills | Status: AC | PRN
Start: 2021-12-10 — End: ?

## 2021-12-14 ENCOUNTER — Encounter

## 2021-12-15 NOTE — Telephone Encounter (Signed)
Prior auth needed for [employer]  Scanned to chart

## 2021-12-16 ENCOUNTER — Ambulatory Visit: Payer: MEDICARE | Attending: Sports Medicine | Primary: Family Medicine

## 2021-12-20 ENCOUNTER — Inpatient Hospital Stay: Payer: MEDICARE | Attending: Student in an Organized Health Care Education/Training Program | Primary: Family Medicine

## 2021-12-21 ENCOUNTER — Encounter: Payer: MEDICAID | Attending: Internal Medicine | Primary: Family Medicine

## 2021-12-23 ENCOUNTER — Inpatient Hospital Stay: Admit: 2021-12-23 | Payer: MEDICARE | Primary: Family Medicine

## 2021-12-23 DIAGNOSIS — S22000A Wedge compression fracture of unspecified thoracic vertebra, initial encounter for closed fracture: Secondary | ICD-10-CM

## 2021-12-27 NOTE — Progress Notes (Signed)
Formatting of this note might be different from the original.  LCSW spoke with Aging services in [place]. LCSW was advised that [facility] does not have an adult foster care program.   LCSW was advised to contact Adult services for assistance, Ms. Hunter.   LCSW was further advised to contact Behavioral health care in [place] as they may have some options for housing services.   LCSW will plan to make contact with these additional options with [facility] government to locate assistance for patient.     Patient's [facility] Case worker remains active and supportive to patient. LCSW will continue to provide updates to patient and patient's case worker on any successful connections.     Michelle Piper, MSW, ACM, LCSW    Electronically signed by Mickle Mallory, MSW at 12/27/2021  1:06 PM EDT

## 2021-12-27 NOTE — Telephone Encounter (Signed)
Patient had a MRI of the spine on 12/23/21 and it was confirmed she has a compression fracture of the thoracic vertebra. Patient was scheduled to see Dr.McHugh on 01/06/22 but was referred to see one of the spine doctors after the MRI results came back.  Please advise when the patient can be seen.    Patient also states she currently resides at a women's shelter in [place], and can only accept phone calls up until 6 pm.    Patient can be reached at [phone].

## 2021-12-28 NOTE — Telephone Encounter (Signed)
error 

## 2021-12-29 ENCOUNTER — Ambulatory Visit
Admit: 2021-12-29 | Discharge: 2021-12-29 | Payer: MEDICARE | Attending: Physical Medicine & Rehabilitation | Primary: Family Medicine

## 2021-12-29 ENCOUNTER — Telehealth

## 2021-12-29 DIAGNOSIS — S22050A Wedge compression fracture of T5-T6 vertebra, initial encounter for closed fracture: Secondary | ICD-10-CM

## 2021-12-29 MED ORDER — OXYCODONE-ACETAMINOPHEN 5-325 MG PO TABS
5-325 MG | ORAL_TABLET | Freq: Four times a day (QID) | ORAL | 0 refills | Status: AC | PRN
Start: 2021-12-29 — End: 2022-01-05

## 2021-12-29 MED ORDER — KETOROLAC TROMETHAMINE 30 MG/ML IJ SOLN
30 MG/ML | Freq: Once | INTRAMUSCULAR | Status: AC
Start: 2021-12-29 — End: 2021-12-29
  Administered 2021-12-29: 15:00:00 30 mg via INTRAMUSCULAR

## 2021-12-29 NOTE — Telephone Encounter (Signed)
I called stating she's in a lot of pain, and asking if her pain prescription can be sent to her pharmacy as soon as possible. [facility], and can be reached at [phone].

## 2021-12-29 NOTE — Telephone Encounter (Signed)
Called patient identified by two verifiers. Explained Percocet sent per Dr Wilford Corner' instruction
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
## 2021-12-29 NOTE — Telephone Encounter (Signed)
Percocet sent per Dr Wilford Corner' instruction

## 2021-12-31 NOTE — Telephone Encounter (Signed)
Patient contacted and told that the order has been entered and that it may be sometime next week before she hears from their office to schedule this.

## 2021-12-31 NOTE — Telephone Encounter (Signed)
Patient called regarding the referral for her compression deformity. Patient was under the impression she was going to hear from someone the afternoon of her appointment.     Patient is requesting an update on this referral.

## 2022-01-04 ENCOUNTER — Telehealth

## 2022-01-04 MED ORDER — POLYETHYLENE GLYCOL 3350 17 GM/SCOOP PO POWD
17 GM/SCOOP | Freq: Every day | ORAL | 0 refills | Status: AC | PRN
Start: 2022-01-04 — End: 2022-02-03

## 2022-01-04 NOTE — Telephone Encounter (Signed)
Lauren from [facility] called and stated that the patient called her crying because she hasn't heard anything regarding her CT referral/order.     I advised her that I actually spoke to the patient this morning as well and transferred her to Central scheduling but she hasn't been scheduled yet.     Lauren stated that she'll try to call Central scheduling as well.    Patient is requesting a call back at [phone].

## 2022-01-04 NOTE — Telephone Encounter (Signed)
[facility] been trying to get in touch with [facility]. I spoke with Esmeralda Links with [facility] regarding procedure request entered on 12/29/2021. They did not receive the request because it was entered as a procedure. They will check with Vernona Rieger Renegar to see how she wants to have this scheduled and will reach out to the patient to schedule. Brie has my direct number in case there is an issue with the order. Thank you.    Please address the constipation issue below.

## 2022-01-04 NOTE — Telephone Encounter (Signed)
I put in an order for IR referral.   I sent in Rx for generic miralax. She can also take Dulcolax OTC. If not working, then I will call in Dulcolax suppository if she wants.

## 2022-01-04 NOTE — Telephone Encounter (Signed)
I called and spoke to the pt. The pt was identified using 2 pt identifiers. She was asked what type of test she was looking to schedule. As I was doing a chart review, it was noted that there was another order for an injection regarding her new compression fx. The pt confirmed that this is what she is wanting to get scheduled. I let her know that I will need to call that office and leave a message. The pt is also complaining of not being able to have a bowel movement. She states that it has been 10 days since her last one. She reports that she has cut back on the pain medication and tried over the counter stool softeners but nothing seems to help. I did try to call Dr. Jaquita Folds office to talk with Judeth Cornfield, his scheduler. She was not able to be reached. A message was left for her asking her if she can see the order for the injection and if Dr. Wilford Corner needs to actually order another referral in the system. I did not see one of those. I left the pt's name, DOB and phone number on the secured voicemail. I also left the office number if she needs me to put any other orders in. The message will be routed to Dr.Arora regarding the constipation and if there are any suggestions. Pt reports a lot of pain and discomfort with straining.

## 2022-01-05 NOTE — Telephone Encounter (Signed)
I tried to contact the pt regarding her issues from yesterday. She was not able to be reached. A message was left for the pt letting her know that a prescription for miralax was sent to the pharmacy to help her constipation and that she can take OTC dulcolax tabs. She will need to contact the office if this is not helping. The provider can send in suppositories. I did also mention that the scheduler should be trying to contact her to schedule another appt. I provided our office number in case she has any issues, questions or concerns.

## 2022-01-06 ENCOUNTER — Ambulatory Visit: Payer: MEDICAID | Attending: Sports Medicine | Primary: Family Medicine

## 2022-01-07 ENCOUNTER — Telehealth

## 2022-01-07 NOTE — Telephone Encounter (Incomplete)
Interventional Radiology Clinic Consultation Note    Patient: Mary [name]               Sex: female         Date of Visit: ***       Date of Birth: June 18, 1965       Age:  56 y.o.        Referring Physician: Dr. Wynelle Beckmann         HPI:     Navneet Buhr is a 56 y.o. female who has been referred for evaluation of T6 and T7 acute compression fracture from Dr. Trey Sailors.     The patient has a past medical history of osteoporosis, COPD, CHF, bipolar disorder, and DDD. Prior kyphoplasty has been done on T8 at an outside facility. She is on coumadin for her aortic valve and does not have a history of malignancy. She resides in a [facility] in [place].     Johnathan Hausen experienced a ***fall on *** resulting in back pain.    MRI obtained 12/23/21 demonstrates acute compression fracture of T6 and T7 with associated edema and ***% height loss.    The pain is described as***    Back pain is ranked ***/10 when aggravated and *** at rest. Aggravating factors include ***    The back pain is refractory to conservative management such as *** back brace, NSAIDs, physical therapy, lidocaine patches, thermal packs.    Back pain is significantly impacting the patient's activities of daily life such as ***. Sharlee Polselli now ambulates with the assistance of ***    The patient denies any new change in bowel or bladder movements, paresthesias, headache, dizziness, dyspnea, or recent fever or new falls since the most recent imaging.    DEXA from 08/31/2020 demonstrates osteoporosis.    Past Medical History:   Diagnosis Date    Anxiety     Bipolar 1 disorder (HCC)     CHF (congestive heart failure) (HCC)     COPD (chronic obstructive pulmonary disease) (HCC)     DDD (degenerative disc disease), cervical     GERD (gastroesophageal reflux disease)     H/O aortic valve replacement     History of transient ischemic attack (TIA)     Mitral regurgitation and mitral stenosis     PTSD (post-traumatic stress disorder)     Scoliosis     Vitamin D  deficiency      Past Surgical History:   Procedure Laterality Date    CARDIAC CATHETERIZATION      CARDIAC VALVE SURGERY      mitral valve replacement    CESAREAN SECTION      HAND/FINGER SURGERY UNLISTED Right     I&D right middle finger    HYSTERECTOMY (CERVIX STATUS UNKNOWN)      KNEE SURGERY      OVARY REMOVAL       Family History   Problem Relation Age of Onset    No Known Problems Mother      Prior to Admission medications    Medication Sig Start Date End Date Taking? Authorizing Provider   polyethylene glycol (GLYCOLAX) 17 GM/SCOOP powder Take 17 g by mouth daily as needed (constipation) Take w/full glass of water 01/04/22 02/03/22  [name], MD   alendronate (FOSAMAX) 70 MG tablet  12/27/21   Historical Provider, MD   Ubrogepant (UBRELVY) 100 MG TABS Take 100 mg by mouth as needed (Migraine) Take 100 mg at onset of headache;  can repeat in 2 hours if no change; not to be taken more than 2 days a week. 12/10/21   Belachew D Abran Duke, MD   metoprolol succinate (TOPROL XL) 50 MG extended release tablet Take 1 tablet by mouth daily 12/03/21   Myria G Black, APRN - NP   Acetaminophen (TYLENOL ARTHRITIS PAIN PO) Take by mouth    Historical Provider, MD   topiramate (TOPAMAX) 25 MG tablet Take 1 tablet by mouth 2 times daily 11/30/21   Belachew D Abran Duke, MD   pregabalin (LYRICA) 200 MG capsule Take 1 capsule by mouth 3 times daily for 180 days. 10/15/21 04/13/22  Valetta Fuller, APRN - NP   LORazepam (ATIVAN) 0.5 MG tablet Take 1 tablet by mouth every 6 hours as needed for Anxiety.  Patient not taking: Reported on 12/29/2021    Historical Provider, MD   ranolazine (RANEXA) 500 MG extended release tablet Take 1 tablet by mouth 2 times daily  Patient not taking: Reported on 11/30/2021 09/24/21   Sandre Kitty, MD   warfarin (COUMADIN) 2.5 MG tablet Take 1 tablet by mouth daily 09/14/21   Nicanor Bake, APRN - NP   citalopram (CELEXA) 20 MG tablet Take 1 tablet by mouth daily 08/27/21   Rowan Blase, MD   pravastatin  (PRAVACHOL) 40 MG tablet Take 1 tablet by mouth daily  Patient not taking: Reported on 11/30/2021 08/27/21   Rowan Blase, MD   omeprazole (PRILOSEC) 40 MG delayed release capsule Take 1 capsule by mouth daily 08/27/21   Rowan Blase, MD   vitamin B-12 (CYANOCOBALAMIN) 1000 MCG tablet Take 1 tablet by mouth daily    Historical Provider, MD   albuterol sulfate HFA (PROVENTIL;VENTOLIN;PROAIR) 108 (90 Base) MCG/ACT inhaler Inhale 2 puffs into the lungs every 6 hours as needed 11/19/19   Ar Automatic Reconciliation   aspirin 81 MG chewable tablet Take 1 tablet by mouth daily    Ar Automatic Reconciliation   cetirizine (ZYRTEC) 10 MG tablet Take 1 tablet by mouth daily 10/23/19   Ar Automatic Reconciliation   docusate (COLACE, DULCOLAX) 100 MG CAPS Col-Rite 100 mg capsule   take 1 capsule by mouth twice a day if needed for constipation    Ar Automatic Reconciliation   fluticasone (FLONASE) 50 MCG/ACT nasal spray 1 spray by nasal route daily 05/09/20   Ar Automatic Reconciliation   furosemide (LASIX) 40 MG tablet Take 1 tablet by mouth daily as needed    Ar Automatic Reconciliation     Allergies   Allergen Reactions    Lithium Nausea And Vomiting     Vomiting      Alprazolam Other (See Comments)     Gets suicidal    Dermatitis Antigen      Rash and blisers  Rash and blisers    Diazepam Other (See Comments)     Gets suicidal    Levofloxacin Other (See Comments)     Muscle stiffness and bulging muscles     Tobacco Other (See Comments)     Increasing lung deterioration     Adhesive Tape Rash     blisters      Review of Systems   Pertinent items are noted in the History of Present Illness.        Physical Exam:     The physical exam was deferred        Assessment    Acute compression fracture of T6 and T7   Osteoporosis  The kyphoplasty procedure was discussed with the patient in great detail, including risks of injury, infection, and bleeding. There is risk of temporary or even permanent paralysis when  working near the spinal cord. All questions were answered and concerns  addressed.         Plan     Case and images reviewed by Dr. Larry Sierras.        Kyphoplasty of T6 and T7 - patient called today, will try again next week       1 month follow up with IR clinic post procedure      I have spent 45 minutes with the patient with greater than 50% of the time dedicated to the patient's counseling as well as the coordination of patient care.      Thank you,   Denton Lank, PA

## 2022-01-10 ENCOUNTER — Encounter: Payer: MEDICARE | Attending: Surgery | Primary: Family Medicine

## 2022-01-12 ENCOUNTER — Telehealth

## 2022-01-12 MED ORDER — OXYCODONE-ACETAMINOPHEN 5-325 MG PO TABS
5-325 MG | ORAL_TABLET | Freq: Four times a day (QID) | ORAL | 0 refills | Status: AC | PRN
Start: 2022-01-12 — End: 2022-01-19

## 2022-01-12 NOTE — Telephone Encounter (Signed)
Looks like IR has been trying to reach patient.

## 2022-01-12 NOTE — Telephone Encounter (Signed)
She was referred to Dr. Larry Sierras for kyphoplasty.  Please make sure this is being taken care of.  Will send in refill of percocet.

## 2022-01-12 NOTE — Telephone Encounter (Signed)
Patient is requesting a call back stats that she is confused on who she was referred to and certain appointments because her blood sugar dropped and now she can barely remember anything. Patient would also like to know if she can get a refill on oxycodone states that she is completely out sent in to her Pharmacy.    [facility]   [address]   [phone]    Please call and advise patient at  [phone]

## 2022-01-12 NOTE — Telephone Encounter (Signed)
Patient called to check status of previous message.     Patient was given number for Radiologist that she has seen for appt on 8/25    Patient was also informed the refill request is with the clinical staff.

## 2022-01-13 NOTE — Telephone Encounter (Signed)
I called and spoke to the pt. The pt was identified using 2 pt identifiers. She states that she spoke to Becker, Dr. Jaquita Folds scheduler, this morning. She was told that they need another referral sent over. I reviewed the pt's chart and the last referral was done 01/04/22 but it was closed out. The pt states that she was told they would call her back but she has not heard anything yet. I spoke to Dr. Wilford Corner. She gave a verbal order to redo the referral. This was done and sent electronically to Dr. Jaquita Folds office.

## 2022-01-14 ENCOUNTER — Encounter: Payer: MEDICARE | Attending: Student in an Organized Health Care Education/Training Program | Primary: Family Medicine

## 2022-01-16 MED ORDER — ONDANSETRON HCL 4 MG PO TABS
4 MG | ORAL_TABLET | Freq: Three times a day (TID) | ORAL | 0 refills | Status: AC | PRN
Start: 2022-01-16 — End: 2022-05-08

## 2022-01-16 NOTE — Progress Notes (Signed)
Answering service -pt w/nausea w/Percocet.  Spoke w/pt. Has comp fx. Nausea w/percocet x3days. Denies fever, vomiting, constipation resolved. Hx GERD.  Advised to take half doses w/food. Rx sent for Zofran. Advised to go to ED if worsening symptoms.

## 2022-01-18 ENCOUNTER — Telehealth

## 2022-01-18 ENCOUNTER — Encounter: Admit: 2022-01-18 | Discharge: 2022-01-18 | Payer: MEDICARE | Attending: Surgery | Primary: Family Medicine

## 2022-01-18 DIAGNOSIS — R1033 Periumbilical pain: Secondary | ICD-10-CM

## 2022-01-18 MED ORDER — TRAMADOL HCL 50 MG PO TABS
50 MG | ORAL_TABLET | Freq: Four times a day (QID) | ORAL | 0 refills | Status: AC | PRN
Start: 2022-01-18 — End: 2022-01-25

## 2022-01-18 NOTE — Progress Notes (Signed)
Mary [name] is a 56 y.o. female (DOB: [birth year]) presenting to address:    Chief Complaint   Patient presents with    3 Month Follow-Up     Mass on chest area       Medication list and allergies have been reviewed with Mary [name] and updated as of today's date.     I have gone over all Medical, Surgical and Social History with Mary [name] and updated/added the information accordingly.       1. Have you been to the ER, Urgent Care or Hospitalized since your last visit? No          2. Have you followed up with your PCP or any other Physicians since your procedure/ last office visit?   No

## 2022-01-18 NOTE — Telephone Encounter (Signed)
Answering service -pt w/nausea w/Percocet.  Spoke w/pt. Has comp fx. Nausea w/percocet x3days. Denies fever, vomiting, constipation resolved. Hx GERD.  Advised to take half doses w/food. Rx sent for Zofran. Advised to go to ED if worsening symptoms.            Patient called again after hours for worsening abdominal pain w/fever. Advised to go to ED.

## 2022-01-18 NOTE — Telephone Encounter (Addendum)
Called patient identified by two verifiers. Explained  DC percocet. Sent in RX for Tramadol. Start w/half tabs w/food. Patient verbalized full understanding thanked me for the call.

## 2022-01-18 NOTE — Telephone Encounter (Signed)
Patient advised Mary [name] is still having an upset stomach when taking her pain meds, Mary [name] saw Dr. Walker Kehr this morning and he thinks Mary [name] might have an ulcer (he has put in an order for her to have an CT scan.)   Mary [name] has an appointment with Radiology on Sept 15 .  Mary [name] is requesting another type of pain medication, Mary [name] states Mary [name] is nausea when taking the percocet

## 2022-01-18 NOTE — Progress Notes (Signed)
General Surgery Consult    Mary [name]  Admit date: (Not on file)    MRN: [id number]     DOB: [birth year]     Age: 56 y.o.        Attending Physician: Bradly Chris, MD, FACS      History of Present Illness:      Mary [name] is a 56 y.o. female who I have seen about 4 or 5 months ago for evaluation of a soft tissue mass and some type of abdominal pain with possible hernia.  At that point there was no clear evidence of hernia and she had what seems to be a possible sebaceous cyst in the sternal area but because of her multiple comorbidities there was no need for any surgical intervention.    The patient made an appointment today and she stated that she is having severe epigastric abdominal pain and she was nauseous and she had a bag with her stating that she feels she wants to vomit.  She said that she is been feeling like this for at least 2 weeks and may be longer.  She stated this is possibly from the pain medication and she asked if she can get a shot of IV Toradol in our office.  It was a little bit confusing to me because the patient seems to be very sick according to her and I was not sure why she did not go to the emergency room.  I asked the patient if she would like to be transferred to the emergency room and she stated no and she said she will discuss it with her primary care first.     Patient Active Problem List    Diagnosis Date Noted    Compression fracture of T6 vertebra (HCC) 12/29/2021    Hx of kyphoplasty, T8 12/29/2021    Localized osteoporosis with current pathological fracture 12/29/2021    Sepsis (HCC) 08/23/2021    Bacteremia 08/23/2021    Septic shock (HCC) 08/23/2021    Chronic bilateral thoracic back pain 01/11/2021    Thoracic compression fracture, sequela 01/11/2021    Mitral valve replaced 12/17/2020    Rib pain on left side 07/10/2020    Asthma 07/10/2020    Osteopenia 07/10/2020    Hyperlipidemia 07/10/2020    Diabetes mellitus (HCC) 07/10/2020    Chest congestion 07/10/2020     Elevated INR 07/10/2020    History of mitral valve replacement with bioprosthetic valve 07/10/2020    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    Anxiety disorder 07/10/2020    Bipolar affective disorder, depressed in partial remission (HCC) 07/10/2020    Heart valve pulmonary stenosis 07/10/2020    Lumbar compression fracture (HCC) 07/10/2020    Afib (HCC) 07/10/2020    Allergy 07/10/2020    Abrasion of right middle finger with infection 07/05/2020    Suppurative tenosynovitis of flexor tendon of right hand 07/05/2020    Cellulitis and abscess of hand 07/05/2020    Abscess of finger 07/05/2020    Chronic pain syndrome 06/03/2020    COVID-19 05/03/2020    Respiratory failure (HCC) 05/03/2020    Mitral regurgitation 04/23/2020    DDD (degenerative disc disease), lumbosacral 08/21/2019    TIA (transient ischemic attack) 07/24/2017    Severe obesity with body mass index (BMI) of 35.0 to 39.9 with serious comorbidity (HCC) 07/08/2017    Sensorineural hearing loss (SNHL) of both ears 07/05/2017    Bipolar I disorder with depression, severe (HCC) 02/07/2017    Severe  obesity (BMI >= 40) (HCC) 12/17/2015    Chronic obstructive pulmonary disease (HCC) 08/24/2015    Moderate to severe pulmonary hypertension (HCC) 07/10/2015    Diastolic CHF, chronic (HCC) 07/05/2015    Dyslipidemia 06/12/2015    Onycholysis 03/20/2015    Facial neuropathy, traumatic, left, 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    Nicotine dependence 10/09/2013    Nodule of right lung 09/25/2013    Onychomycosis 03/18/2013    Antral gastritis 09/28/2012    Allergic rhinitis 11/09/2011     Past Medical History:   Diagnosis Date    Anxiety     Bipolar 1 disorder (HCC)     CHF (congestive heart failure) (HCC)     COPD (chronic obstructive pulmonary disease) (HCC)     DDD (degenerative disc disease), cervical     GERD (gastroesophageal reflux disease)     H/O aortic valve replacement     History of transient ischemic attack (TIA)     Mitral regurgitation and mitral stenosis     PTSD  (post-traumatic stress disorder)     Scoliosis     Vitamin D deficiency       Past Surgical History:   Procedure Laterality Date    CARDIAC CATHETERIZATION      CARDIAC VALVE SURGERY      mitral valve replacement    CESAREAN SECTION      HAND/FINGER SURGERY UNLISTED Right     I&D right middle finger    HYSTERECTOMY (CERVIX STATUS UNKNOWN)      KNEE SURGERY      OVARY REMOVAL        Social History     Tobacco Use    Smoking status: Every Day     Packs/day: 0.75     Types: Cigarettes     Passive exposure: Current    Smokeless tobacco: Never   Substance Use Topics    Alcohol use: Never      Social History     Tobacco Use   Smoking Status Every Day    Packs/day: 0.75    Types: Cigarettes    Passive exposure: Current   Smokeless Tobacco Never     Family History   Problem Relation Age of Onset    No Known Problems Mother       Current Outpatient Medications   Medication Sig    alendronate (FOSAMAX) 70 MG tablet     metoprolol succinate (TOPROL XL) 50 MG extended release tablet Take 1 tablet by mouth daily    Acetaminophen (TYLENOL ARTHRITIS PAIN PO) Take by mouth    citalopram (CELEXA) 20 MG tablet Take 1 tablet by mouth daily    omeprazole (PRILOSEC) 40 MG delayed release capsule Take 1 capsule by mouth daily    albuterol sulfate HFA (PROVENTIL;VENTOLIN;PROAIR) 108 (90 Base) MCG/ACT inhaler Inhale 2 puffs into the lungs every 6 hours as needed    aspirin 81 MG chewable tablet Take 1 tablet by mouth daily    cetirizine (ZYRTEC) 10 MG tablet Take 1 tablet by mouth daily    docusate (COLACE, DULCOLAX) 100 MG CAPS Col-Rite 100 mg capsule   take 1 capsule by mouth twice a day if needed for constipation    fluticasone (FLONASE) 50 MCG/ACT nasal spray 1 spray by nasal route daily    furosemide (LASIX) 40 MG tablet Take 1 tablet by mouth daily as needed    ondansetron (ZOFRAN) 4 MG tablet Take 1 tablet by mouth 3 times daily as needed for Nausea or Vomiting  oxyCODONE-acetaminophen (PERCOCET) 5-325 MG per tablet Take 1 tablet  by mouth every 6 hours as needed for Pain for up to 7 days. Intended supply: 3 days. Take lowest dose possible to manage pain Max Daily Amount: 4 tablets    polyethylene glycol (GLYCOLAX) 17 GM/SCOOP powder Take 17 g by mouth daily as needed (constipation) Take w/full glass of water    Ubrogepant (UBRELVY) 100 MG TABS Take 100 mg by mouth as needed (Migraine) Take 100 mg at onset of headache; can repeat in 2 hours if no change; not to be taken more than 2 days a week.    topiramate (TOPAMAX) 25 MG tablet Take 1 tablet by mouth 2 times daily    pregabalin (LYRICA) 200 MG capsule Take 1 capsule by mouth 3 times daily for 180 days.    LORazepam (ATIVAN) 0.5 MG tablet Take 1 tablet by mouth every 6 hours as needed for Anxiety. (Patient not taking: Reported on 12/29/2021)    ranolazine (RANEXA) 500 MG extended release tablet Take 1 tablet by mouth 2 times daily (Patient not taking: Reported on 11/30/2021)    warfarin (COUMADIN) 2.5 MG tablet Take 1 tablet by mouth daily    pravastatin (PRAVACHOL) 40 MG tablet Take 1 tablet by mouth daily (Patient not taking: Reported on 11/30/2021)    vitamin B-12 (CYANOCOBALAMIN) 1000 MCG tablet Take 1 tablet by mouth daily     Current Facility-Administered Medications   Medication Dose Route Frequency    nitroGLYCERIN (NITROSTAT) SL tablet 0.4 mg  0.4 mg SubLINGual Q5 Min PRN      Allergies   Allergen Reactions    Lithium Nausea And Vomiting     Vomiting      Alprazolam Other (See Comments)     Gets suicidal    Dermatitis Antigen      Rash and blisers  Rash and blisers    Diazepam Other (See Comments)     Gets suicidal    Levofloxacin Other (See Comments)     Muscle stiffness and bulging muscles     Tobacco Other (See Comments)     Increasing lung deterioration     Adhesive Tape Rash     blisters        Review of Systems:  Pertinent items are noted in the History of Present Illness.    Objective:     BP 109/61 (Site: Right Upper Arm, Position: Sitting, Cuff Size: Large Adult)   Pulse 67    Temp (!) 96 F (35.6 C) (Temporal)   Ht 5\' 3"  (1.6 m)   Wt 160 lb (72.6 kg)   SpO2 91%   BMI 28.34 kg/m     Physical Exam:      General:  Patient is holding a bag stating that she is nauseous but there is no vomiting currently.  Her vitals showed a normal heart rate of 67 and a blood pressure of 109/61.  She does not look dehydrated   Eyes:  conjunctivae and sclerae normal, pupils equal, round, reactive to light   Throat & Neck: normal, no erythema or exudates noted. , and no palpable masses   Lungs:   clear to auscultation bilaterally   Heart:  Regular rate and rhythm   Abdomen:   rounded, soft, nontender except for epigastric tenderness.  I could not feel a clear hernia   Extremities: extremities normal, atraumatic, no cyanosis or edema   Skin: Normal.       Imaging and Lab Review:  CBC:   Lab Results   Component Value Date/Time    WBC 8.0 12/06/2021 10:07 PM    RBC 4.65 12/06/2021 10:07 PM    HGB 13.8 12/06/2021 10:07 PM    HCT 41.5 12/06/2021 10:07 PM    PLT 224 12/06/2021 10:07 PM     BMP:   Lab Results   Component Value Date/Time    NA 138 12/06/2021 10:07 PM    K 3.4 12/06/2021 10:07 PM    CL 107 12/06/2021 10:07 PM    CO2 27 12/06/2021 10:07 PM    BUN 10 12/06/2021 10:07 PM     CMP:  Lab Results   Component Value Date/Time    NA 138 12/06/2021 10:07 PM    K 3.4 12/06/2021 10:07 PM    CL 107 12/06/2021 10:07 PM    CO2 27 12/06/2021 10:07 PM    BUN 10 12/06/2021 10:07 PM    GLOB 2.7 12/05/2021 01:07 PM       No results found for this or any previous visit (from the past 24 hour(s)).    images and reports reviewed    Assessment:   Mary [name] is a 56 y.o. female who has multiple medical condition and is presenting with epigastric abdominal pain with nausea and vomiting.  When asked the patient for her reason for her visit today she stated to follow-up on her chest wall mass but she has been having the nausea and vomiting and epigastric abdominal pain for at least 2 weeks.  I told her that the mass  seems to be very small and it seems like a sebaceous cyst and this is not a priority at all and that the epigastric abdominal pain with the nausea and vomiting needs to be addressed as soon as possible.  I advised her to go to the emergency room but she refused and when she asked if she can get a shot of IV Toradol I explained to her that this we do not do and is not a good idea and she needs to call the office of Dr. Kenna GilbertAbdelshaheed who is her primary care physician .  I also reviewed the chart and she had a CT scan 4 months ago from the emergency room that did not show any hernia so I do not see a reason to repeat the CT scan currently.    Plan:     Advised the patient to report to the emergency room  Call the office of her primary care physician  No need for any surgical intervention currently    Please call me if you have any questions (cell phone: [phone])     Signed By: Bradly ChrisYassar K Raphaela Cannaday, MD     January 18, 2022

## 2022-01-18 NOTE — Telephone Encounter (Signed)
DC percocet. Sent in RX for Tramadol. Start w/half tabs w/food.

## 2022-01-19 LAB — PROTIME-INR: INR: 2.4

## 2022-01-19 NOTE — Patient Instructions (Signed)
[name] is here today for anticoagulation monitoring for the diagnosis of atrial fibrillation and TIA.  Her INR goal is 2.0-3.0 and her current Coumadin dose is 7.5mg  nightly.     Today's findings include an INR of 2.4     Considering [name] past history, todays findings, and per the coumadin policy/protocol, [name] was instructed to take Coumadin as follows,  10mg  01/19/22 then 7.5mg  A.O.D.  She was also instructed to come back in 1 weeks for an INR check.    A full discussion of the nature of anticoagulants has been carried out.  A full discussion of the need for frequent and regular monitoring, precise dosage adjustment and compliance was stressed.  Side effects of potential bleeding were discussed and [name] was instructed to call [phone] if there are any signs of abnormal bleeding.  [name] was instructed to avoid any OTC items containing aspirin or ibuprofen and prior to starting any new OTC products to consult with her physician or pharmacist to ensure no drug interactions are present.  [name] was instructed to avoid any major changes in her general diet and to avoid alcohol consumption.  .      [name] verbalized her understanding of all instructions and will call the office with any questions, concerns, or signs of abnormal bleeding or blood clot.

## 2022-01-21 NOTE — Telephone Encounter (Signed)
Patient reports having  back pain, and asking if she can be prescribed something for the pain. Patient uses the [facility] on [address], and can be reached at [phone].

## 2022-01-21 NOTE — Telephone Encounter (Signed)
A prescription for tramadol was sent in on 01-18-22. Patient should have enough to last until 01-25-22. I left a generic message on VM telling patient that a prescription was sent in on 01-18-22 and should last her until 01-25-22. I explained that Dr Wilford Corner is not in the office today and will be back Monday. I did ask that she call me back. I am at ext [phone] today

## 2022-01-24 NOTE — Telephone Encounter (Signed)
Patient called and stated that her consultation for the bone cement is on Friday.     Her PCP doubled her Prilosec so she's not having as much stomach pain.     She's requesting something a little stronger than Tramadol if possible. She didn't get the message until late on Friday 09/08 about Tramadol being prescribed.     Pharmacy: [facility] on [address] in [place].

## 2022-01-24 NOTE — Telephone Encounter (Signed)
Since she has the Tramadol, she can double up and take 2 together as needed for pain.

## 2022-01-25 NOTE — Telephone Encounter (Addendum)
LMOVM telling patient to call me back. I am at ext [phone]

## 2022-01-25 NOTE — Telephone Encounter (Signed)
Patient called back and I told her that Dr Wilford Corner said to take 2 tabs at the time. She expressed understanding

## 2022-02-07 NOTE — ED Notes (Signed)
Formatting of this note might be different from the original.  Discharge paperwork given.     PIV removed with catheter intact.     Medicaid cab to be called by Juliann Pulse, AA    Pain assessment on discharge was 0/10.  Condition stable.  Patient discharged to home.  Patient education was completed:  yes  Education taught to:  patient  Teaching method used was discussion and handout.  Understanding of teaching was good.  Patient was discharged ambulatory.  Discharged with self.  Valuables were given to: patient.    Electronically signed by Garnet Sierras, RN at 02/07/2022 12:59 PM EDT

## 2022-02-07 NOTE — ED Triage Notes (Signed)
Formatting of this note might be different from the original.  Patient brought in via EMS from home complaining of dizziness, nausea and pain and is non compliant with her meds.   Electronically signed by Elpidio Eric, RN at 02/07/2022  6:57 AM EDT

## 2022-02-07 NOTE — ED Notes (Signed)
Formatting of this note might be different from the original.  Assumed care of pt from [facility].     Pt here with c/o cough, body aches, dizziness, and being out of her medications. Per pt her living arrangements have changed, and she believes where she is staying now has taken her meds. Per pt she has been out of her lyrica and that she hasnt had her coumadin in 4 days.       Electronically signed by Garnet Sierras, RN at 02/07/2022  1:01 PM EDT
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
## 2022-02-09 ENCOUNTER — Telehealth

## 2022-02-09 NOTE — Telephone Encounter (Signed)
Patient called and is requesting that she get something stronger because the tramadol is not working.          [phone]

## 2022-02-09 NOTE — Telephone Encounter (Signed)
When is she getting the kyphoplasty?

## 2022-02-10 MED ORDER — HYDROCODONE-ACETAMINOPHEN 5-325 MG PO TABS
5-325 MG | ORAL_TABLET | Freq: Four times a day (QID) | ORAL | 0 refills | Status: AC | PRN
Start: 2022-02-10 — End: 2022-02-17

## 2022-02-10 NOTE — Telephone Encounter (Signed)
Patient called back and asked if she needs to inform her cardiologist about her upcoming surgery.    Please advise.    Patient can be reached at [phone].

## 2022-02-10 NOTE — Telephone Encounter (Signed)
PMP reviewed. Will Rx norco since she had N/V with Percocet.    She needs to call IR. Needs to coordinate with IR pre-procedure since she is on Coumadin.

## 2022-02-10 NOTE — Telephone Encounter (Signed)
Patient called to check status of med request, says her surgery is scheduled for 10/5 and then the call dropped.

## 2022-02-10 NOTE — Telephone Encounter (Signed)
Attempted to contact the pt regarding her concerns. She was not able to be reached. A message was left for the pt letting her know that a prescription for norco was sent electronically over to her [facility] [address] in [place]. I also let her know that she needs to contact the office of the interventional radiologist to see if they know that she is on coumadin. They should have already checked into this before scheduling the procedure. The number to our office was provided in case there are any questions or concerns.

## 2022-02-10 NOTE — Telephone Encounter (Signed)
Patient called again and is getting increasingly anxious about medication.  She has a limited window of time for transportation and needs to know if she is going to get medication called in today (says her transportation stops at 5pm and it's a 3 hour transportation request).  Please advise patient as soon as possible, [phone].

## 2022-02-10 NOTE — Telephone Encounter (Signed)
See if Dr Rory Percy wants to give her something stronger for pain until her procedure.  She did not tolerate percocet and ultram is not helping. I would think the physician doing the procedure would decide if she needs cardiac clearance.

## 2022-02-16 ENCOUNTER — Ambulatory Visit: Payer: MEDICAID | Attending: Physical Medicine & Rehabilitation | Primary: Family Medicine
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
## 2022-02-17 ENCOUNTER — Inpatient Hospital Stay: Payer: MEDICARE | Primary: Family Medicine

## 2022-02-17 NOTE — Progress Notes (Signed)
Interventional Radiology    The patient did not present today for her kyphoplasty procedure.  Multiple phone calls were made - no answer    IR will remain available as needed for kyphoplasty.   The patient will need to reschedule with our office.    Thank you,  Kathee Polite, [place]
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
## 2022-02-21 MED ORDER — WARFARIN SODIUM 2.5 MG PO TABS
2.5 MG | ORAL_TABLET | Freq: Every day | ORAL | 1 refills | Status: AC
Start: 2022-02-21 — End: ?

## 2022-02-21 NOTE — Telephone Encounter (Signed)
Call Dr. Iona Beard office and find out what is going on with her kyphoplasty.
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
## 2022-02-22 NOTE — Telephone Encounter (Signed)
I spoke with Colletta [place] at Dr. Iona Beard office and provided the contact information below. Ms. Spagnolo has completed the initial consultation and [facility] is working on scheduling the kyphoplasty. Dr. Cleon Dew was out of the office last week therefore scheduling was delayed. Colletta [place] will contact Ms. Riesen today. Thank you

## 2022-02-23 NOTE — Telephone Encounter (Signed)
I spoke with Dr. Rory Percy regarding this and pt will have to figure out how to get her own phone.

## 2022-02-25 ENCOUNTER — Ambulatory Visit: Payer: MEDICAID | Attending: Nurse Practitioner | Primary: Family Medicine

## 2022-02-25 MED ORDER — WARFARIN SODIUM 2.5 MG PO TABS
2.5 MG | ORAL_TABLET | Freq: Every day | ORAL | 1 refills | Status: DC
Start: 2022-02-25 — End: 2022-04-06

## 2022-03-01 ENCOUNTER — Telehealth

## 2022-03-01 NOTE — Telephone Encounter (Signed)
PMP reviewed. Last Norco filled 02/10/22. Upcoming kyphoplasty. Will send small Rx Norco.
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
## 2022-03-02 ENCOUNTER — Ambulatory Visit: Payer: MEDICAID | Attending: Internal Medicine | Primary: Family Medicine

## 2022-03-02 MED ORDER — HYDROCODONE-ACETAMINOPHEN 5-325 MG PO TABS
5-325 MG | ORAL_TABLET | Freq: Four times a day (QID) | ORAL | 0 refills | Status: AC | PRN
Start: 2022-03-02 — End: 2022-03-04

## 2022-03-02 NOTE — Telephone Encounter (Signed)
Called patient identified by two verifiers. Explained  PMP reviewed. Last Norco filled 02/10/22. Upcoming kyphoplasty. Will send small Rx Norco. She verbalized understanding and thanked me.

## 2022-03-03 ENCOUNTER — Inpatient Hospital Stay: Admit: 2022-03-03 | Payer: MEDICARE | Attending: Anesthesiology | Primary: Family Medicine

## 2022-03-03 DIAGNOSIS — S22000A Wedge compression fracture of unspecified thoracic vertebra, initial encounter for closed fracture: Secondary | ICD-10-CM

## 2022-03-03 LAB — BASIC METABOLIC PANEL
Anion Gap: 2 mmol/L — ABNORMAL LOW (ref 3.0–18)
BUN: 18 MG/DL (ref 7.0–18)
Bun/Cre Ratio: 16 (ref 12–20)
CO2: 29 mmol/L (ref 21–32)
Calcium: 9.3 MG/DL (ref 8.5–10.1)
Chloride: 109 mmol/L (ref 100–111)
Creatinine: 1.1 MG/DL (ref 0.6–1.3)
Est, Glom Filt Rate: 59 mL/min/{1.73_m2} — ABNORMAL LOW (ref >60)
Glucose: 81 mg/dL (ref 74–99)
Potassium: 3.9 mmol/L (ref 3.5–5.5)
Sodium: 140 mmol/L (ref 136–145)

## 2022-03-03 LAB — CBC WITH AUTO DIFFERENTIAL
Basophils %: 1 % (ref 0–2)
Basophils Absolute: 0.1 10*3/uL (ref 0.0–0.1)
Eosinophils %: 2 % (ref 0–5)
Eosinophils Absolute: 0.1 10*3/uL (ref 0.0–0.4)
Hematocrit: 41.6 % (ref 35.0–45.0)
Hemoglobin: 13.3 g/dL (ref 12.0–16.0)
Immature Granulocytes %: 0 % (ref 0.0–0.5)
Immature Granulocytes Absolute: 0 10*3/uL (ref 0.00–0.04)
Lymphocytes %: 30 % (ref 21–52)
Lymphocytes Absolute: 2 10*3/uL (ref 0.9–3.6)
MCH: 30.1 PG (ref 24.0–34.0)
MCHC: 32 g/dL (ref 31.0–37.0)
MCV: 94.1 FL (ref 78.0–100.0)
MPV: 10.9 FL (ref 9.2–11.8)
Monocytes %: 4 % (ref 3–10)
Monocytes Absolute: 0.3 10*3/uL (ref 0.05–1.2)
Neutrophils %: 63 % (ref 40–73)
Neutrophils Absolute: 4.2 10*3/uL (ref 1.8–8.0)
Nucleated RBCs: 0 PER 100 WBC
Platelets: 286 10*3/uL (ref 135–420)
RBC: 4.42 M/uL (ref 4.20–5.30)
RDW: 14.2 % (ref 11.6–14.5)
WBC: 6.6 10*3/uL (ref 4.6–13.2)
nRBC: 0 10*3/uL (ref 0.00–0.01)

## 2022-03-03 LAB — PROTIME-INR
INR: 1 (ref 0.9–1.1)
Protime: 12.8 s (ref 11.9–14.7)

## 2022-03-03 LAB — APTT: APTT: 30.5 s (ref 23.0–36.4)

## 2022-03-03 MED ORDER — ONDANSETRON HCL 4 MG/2ML IJ SOLN
4 MG/2ML | Freq: Once | INTRAMUSCULAR | Status: AC | PRN
Start: 2022-03-03 — End: 2022-03-04

## 2022-03-03 MED ORDER — FENTANYL CITRATE (PF) 100 MCG/2ML IJ SOLN
100 | INTRAMUSCULAR | Status: DC | PRN
Start: 2022-03-03 — End: 2022-03-07

## 2022-03-03 MED ORDER — LIDOCAINE HCL (PF) 2 % IJ SOLN
2 % | INTRAMUSCULAR | Status: DC | PRN
Start: 2022-03-03 — End: 2022-03-03
  Administered 2022-03-03: 15:00:00 70 via INTRAVENOUS

## 2022-03-03 MED ORDER — MIDAZOLAM HCL 2 MG/2ML IJ SOLN
2 MG/ML | INTRAMUSCULAR | Status: DC | PRN
Start: 2022-03-03 — End: 2022-03-03
  Administered 2022-03-03: 14:00:00 1 via INTRAVENOUS

## 2022-03-03 MED ORDER — HYDROMORPHONE 0.5MG/0.5ML IJ SOLN
1 MG/ML | Status: DC | PRN
Start: 2022-03-03 — End: 2022-03-07

## 2022-03-03 MED ORDER — PROPOFOL 200 MG/20ML IV EMUL
200 MG/20ML | INTRAVENOUS | Status: DC | PRN
Start: 2022-03-03 — End: 2022-03-03

## 2022-03-03 MED ORDER — FAMOTIDINE 20 MG PO TABS
20 MG | Freq: Once | ORAL | Status: AC
Start: 2022-03-03 — End: 2022-03-07

## 2022-03-03 MED ORDER — FENTANYL CITRATE (PF) 100 MCG/2ML IJ SOLN
100 MCG/2ML | INTRAMUSCULAR | Status: DC | PRN
Start: 2022-03-03 — End: 2022-03-03
  Administered 2022-03-03 (×3): 25 via INTRAVENOUS

## 2022-03-03 MED ORDER — ACETAMINOPHEN 500 MG PO TABS
500 MG | Freq: Once | ORAL | Status: AC | PRN
Start: 2022-03-03 — End: 2022-03-03
  Administered 2022-03-03: 18:00:00 500 mg via ORAL

## 2022-03-03 MED ORDER — SODIUM CHLORIDE 0.9 % IV SOLN
0.9 % | INTRAVENOUS | Status: DC
Start: 2022-03-03 — End: 2022-03-07

## 2022-03-03 MED ORDER — LACTATED RINGERS IV SOLN
INTRAVENOUS | Status: AC
Start: 2022-03-03 — End: 2022-03-07

## 2022-03-03 MED ORDER — CEFAZOLIN SODIUM 1 G IJ SOLR
1 g | INTRAMUSCULAR | Status: DC | PRN
Start: 2022-03-03 — End: 2022-03-03
  Administered 2022-03-03: 15:00:00 2 via INTRAVENOUS

## 2022-03-03 MED ORDER — SODIUM CHLORIDE 0.9 % IV SOLN
0.9 % | INTRAVENOUS | Status: DC
Start: 2022-03-03 — End: 2022-03-07
  Administered 2022-03-03: 14:00:00 via INTRAVENOUS

## 2022-03-03 MED ORDER — NORMAL SALINE FLUSH 0.9 % IV SOLN
0.9 % | Freq: Two times a day (BID) | INTRAVENOUS | Status: AC
Start: 2022-03-03 — End: 2022-03-07

## 2022-03-03 MED ORDER — LIDOCAINE HCL 1% INJ (MIXTURES ONLY)
1 % | INTRAMUSCULAR | Status: AC
Start: 2022-03-03 — End: ?

## 2022-03-03 MED ORDER — OXYCODONE HCL 5 MG PO TABS
5 MG | Freq: Once | ORAL | Status: AC | PRN
Start: 2022-03-03 — End: 2022-03-03
  Administered 2022-03-03: 18:00:00 5 mg via ORAL

## 2022-03-03 MED ORDER — PROPOFOL 200 MG/20ML IV EMUL
200 MG/20ML | INTRAVENOUS | Status: DC | PRN
Start: 2022-03-03 — End: 2022-03-03
  Administered 2022-03-03: 15:00:00 75 via INTRAVENOUS

## 2022-03-03 MED ORDER — LIDOCAINE HCL (PF) 1 % IJ SOLN
1 % | Freq: Once | INTRAMUSCULAR | Status: AC | PRN
Start: 2022-03-03 — End: 2022-03-04

## 2022-03-03 MED FILL — XYLOCAINE-MPF 1 % IJ SOLN: 1 % | INTRAMUSCULAR | Qty: 30

## 2022-03-03 MED FILL — SODIUM CHLORIDE 0.9 % IV SOLN: 0.9 % | INTRAVENOUS | Qty: 1000

## 2022-03-03 MED FILL — LACTATED RINGERS IV SOLN: INTRAVENOUS | Qty: 1000

## 2022-03-03 MED FILL — ACETAMINOPHEN EXTRA STRENGTH 500 MG PO TABS: 500 MG | ORAL | Qty: 1

## 2022-03-03 MED FILL — FENTANYL CITRATE (PF) 100 MCG/2ML IJ SOLN: 100 MCG/2ML | INTRAMUSCULAR | Qty: 2

## 2022-03-03 MED FILL — OXYCODONE HCL 5 MG PO TABS: 5 MG | ORAL | Qty: 1

## 2022-03-03 MED FILL — BD POSIFLUSH 0.9 % IV SOLN: 0.9 % | INTRAVENOUS | Qty: 40

## 2022-03-03 NOTE — Anesthesia Pre-Procedure Evaluation (Signed)
Department of Anesthesiology  Preprocedure Note       Name:  Mary [name]   Age:  56 y.o.  DOB:  1965-09-20                                          MRN:  [id number]         Date:  03/03/2022      Surgeon: * No surgeons listed *    Procedure: * No procedures listed *    Medications prior to admission:   Prior to Admission medications    Medication Sig Start Date End Date Taking? Authorizing Provider   HYDROcodone-acetaminophen (NORCO) 5-325 MG per tablet Take 1 tablet by mouth every 6 hours as needed for Pain for up to 3 days. Intended supply: 3 days. Take lowest dose possible to manage pain Max Daily Amount: 4 tablets 03/01/22 03/04/22  Creola Corn, MD   warfarin (COUMADIN) 2.5 MG tablet take 1 tablet by mouth once daily 02/25/22   Black, Wallene Huh, APRN - NP   ondansetron (ZOFRAN) 4 MG tablet Take 1 tablet by mouth 3 times daily as needed for Nausea or Vomiting 01/16/22   Creola Corn, MD   alendronate (FOSAMAX) 70 MG tablet  12/27/21   [provider]   Ubrogepant (UBRELVY) 100 MG TABS Take 100 mg by mouth as needed (Migraine) Take 100 mg at onset of headache; can repeat in 2 hours if no change; not to be taken more than 2 days a week. 12/10/21   Janeece Fitting D, MD   metoprolol succinate (TOPROL XL) 50 MG extended release tablet Take 1 tablet by mouth daily 12/03/21   Black, Wallene Huh, APRN - NP   Acetaminophen (TYLENOL ARTHRITIS PAIN PO) Take by mouth    [provider]   topiramate (TOPAMAX) 25 MG tablet Take 1 tablet by mouth 2 times daily 11/30/21   Janeece Fitting D, MD   pregabalin (LYRICA) 200 MG capsule Take 1 capsule by mouth 3 times daily for 180 days. 10/15/21 04/13/22  Blount, Melene Plan, APRN - NP   LORazepam (ATIVAN) 0.5 MG tablet Take 1 tablet by mouth every 6 hours as needed for Anxiety.  Patient not taking: Reported on 12/29/2021    [provider]   ranolazine (RANEXA) 500 MG extended release tablet Take 1 tablet by mouth 2 times daily  Patient not taking: Reported on  11/30/2021 09/24/21   Gaston Islam, MD   citalopram (CELEXA) 20 MG tablet Take 1 tablet by mouth daily 08/27/21   Reineke-Piper, Donnita [place], MD   pravastatin (PRAVACHOL) 40 MG tablet Take 1 tablet by mouth daily  Patient not taking: Reported on 11/30/2021 08/27/21   Reineke-Piper, Donnita [place], MD   omeprazole (PRILOSEC) 40 MG delayed release capsule Take 1 capsule by mouth daily 08/27/21   Reineke-Piper, Donnita [place], MD   vitamin B-12 (CYANOCOBALAMIN) 1000 MCG tablet Take 1 tablet by mouth daily    [provider]   albuterol sulfate HFA (PROVENTIL;VENTOLIN;PROAIR) 108 (90 Base) MCG/ACT inhaler Inhale 2 puffs into the lungs every 6 hours as needed 11/19/19   Automatic Reconciliation, Ar   aspirin 81 MG chewable tablet Take 1 tablet by mouth daily    Automatic Reconciliation, Ar   cetirizine (ZYRTEC) 10 MG tablet Take 1 tablet by mouth daily 10/23/19   Automatic Reconciliation, Ar   docusate (COLACE,  DULCOLAX) 100 MG CAPS Col-Rite 100 mg capsule   take 1 capsule by mouth twice a day if needed for constipation    Automatic Reconciliation, Ar   fluticasone (FLONASE) 50 MCG/ACT nasal spray 1 spray by nasal route daily 05/09/20   Automatic Reconciliation, Ar   furosemide (LASIX) 40 MG tablet Take 1 tablet by mouth daily as needed    Automatic Reconciliation, Ar       Current medications:    Current Outpatient Medications   Medication Sig Dispense Refill   . HYDROcodone-acetaminophen (NORCO) 5-325 MG per tablet Take 1 tablet by mouth every 6 hours as needed for Pain for up to 3 days. Intended supply: 3 days. Take lowest dose possible to manage pain Max Daily Amount: 4 tablets 12 tablet 0   . warfarin (COUMADIN) 2.5 MG tablet take 1 tablet by mouth once daily 90 tablet 1   . ondansetron (ZOFRAN) 4 MG tablet Take 1 tablet by mouth 3 times daily as needed for Nausea or Vomiting 15 tablet 0   . alendronate (FOSAMAX) 70 MG tablet      . Ubrogepant (UBRELVY) 100 MG TABS Take 100 mg by mouth as needed (Migraine) Take 100 mg at  onset of headache; can repeat in 2 hours if no change; not to be taken more than 2 days a week. 10 tablet 3   . metoprolol succinate (TOPROL XL) 50 MG extended release tablet Take 1 tablet by mouth daily 90 tablet 3   . Acetaminophen (TYLENOL ARTHRITIS PAIN PO) Take by mouth     . topiramate (TOPAMAX) 25 MG tablet Take 1 tablet by mouth 2 times daily 60 tablet 3   . pregabalin (LYRICA) 200 MG capsule Take 1 capsule by mouth 3 times daily for 180 days. 90 capsule 5   . LORazepam (ATIVAN) 0.5 MG tablet Take 1 tablet by mouth every 6 hours as needed for Anxiety. (Patient not taking: Reported on 12/29/2021)     . ranolazine (RANEXA) 500 MG extended release tablet Take 1 tablet by mouth 2 times daily (Patient not taking: Reported on 11/30/2021) 60 tablet 3   . citalopram (CELEXA) 20 MG tablet Take 1 tablet by mouth daily 30 tablet 0   . pravastatin (PRAVACHOL) 40 MG tablet Take 1 tablet by mouth daily (Patient not taking: Reported on 11/30/2021) 30 tablet 0   . omeprazole (PRILOSEC) 40 MG delayed release capsule Take 1 capsule by mouth daily 30 capsule 0   . vitamin B-12 (CYANOCOBALAMIN) 1000 MCG tablet Take 1 tablet by mouth daily     . albuterol sulfate HFA (PROVENTIL;VENTOLIN;PROAIR) 108 (90 Base) MCG/ACT inhaler Inhale 2 puffs into the lungs every 6 hours as needed     . aspirin 81 MG chewable tablet Take 1 tablet by mouth daily     . cetirizine (ZYRTEC) 10 MG tablet Take 1 tablet by mouth daily     . docusate (COLACE, DULCOLAX) 100 MG CAPS Col-Rite 100 mg capsule   take 1 capsule by mouth twice a day if needed for constipation     . fluticasone (FLONASE) 50 MCG/ACT nasal spray 1 spray by nasal route daily     . furosemide (LASIX) 40 MG tablet Take 1 tablet by mouth daily as needed       Current Facility-Administered Medications   Medication Dose Route Frequency Provider Last Rate Last Admin   . lidocaine PF 1 % injection 1 mL  1 mL IntraDERmal Once PRN Blitzer, Luther Bradley, APRN - CRNA       . [  START ON 03/04/2022]  famotidine (PEPCID) tablet 20 mg  20 mg Oral Once Blitzer, Luther Bradley, APRN - CRNA       . lactated ringers IV soln infusion   IntraVENous Continuous Blitzer, Luther Bradley, APRN - CRNA       . sodium chloride flush 0.9 % injection 5-40 mL  5-40 mL IntraVENous 2 times per day Blitzer, Luther Bradley, APRN - CRNA       . 0.9 % sodium chloride infusion   IntraVENous Continuous Samoilov, Dmitri E, MD       . nitroGLYCERIN (NITROSTAT) SL tablet 0.4 mg  0.4 mg SubLINGual Q5 Min PRN Gaston Islam, MD           Allergies:    Allergies   Allergen Reactions   . Lithium Nausea And Vomiting     Vomiting     . Alprazolam Other (See Comments)     Gets suicidal   . Dermatitis Antigen      Rash and blisers  Rash and blisers   . Diazepam Other (See Comments)     Gets suicidal   . Levofloxacin Other (See Comments)     Muscle stiffness and bulging muscles    . Tobacco Other (See Comments)     Increasing lung deterioration    . Adhesive Tape Rash     blisters       Problem List:    Patient Active Problem List   Diagnosis Code   . 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 Onychomycosis B35.1   . Rib pain on left side R07.81   . DDD (degenerative disc disease), lumbosacral M51.37   . Dyslipidemia E78.5   . Nicotine dependence F17.200   . Severe obesity with body mass index (BMI) of 35.0 to 39.9 with serious comorbidity (HCC) E66.01   . Asthma J45.909   . Antral gastritis K29.50   . Osteopenia M85.80   . Hyperlipidemia E78.5   . Diabetes mellitus ([place]) E11.9   . COVID-19 U07.1   . Abrasion of right middle finger with infection S60.412A, L08.9   . Chest congestion R09.89   . Elevated INR R79.1   . Moderate to severe pulmonary hypertension (HCC) I27.20   . Onycholysis L60.1   . Nodule of right lung R91.1   . Respiratory failure ([place]) J96.90   . Diastolic CHF, chronic (HCC) I50.32   . Allergic rhinitis J30.9   . 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 History of mitral valve replacement with bioprosthetic valve  Z95.3   . Severe obesity (BMI >= 40) (HCC) E66.01   . 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 Anxiety disorder F41.9   . Bipolar affective disorder, depressed in partial remission ([place]) F31.75   . Suppurative tenosynovitis of flexor tendon of right hand M65.141   . Chronic obstructive pulmonary disease (HCC) J44.9   . Bipolar I disorder with depression, severe ([place]) F31.4   . 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 TIA (transient ischemic attack) G45.9   . Cellulitis and abscess of hand L03.119, L02.519   . Lumbar compression fracture (HCC) S32.000A   . Afib ([place]) I48.91   . Abscess of finger L02.519   . Mitral regurgitation I34.0   . Chronic pain syndrome G89.4   . Allergy T78.40XA   . Mitral valve replaced Z95.2   . Chronic bilateral thoracic back pain M54.6, G89.29   . Thoracic compression fracture, sequela S22.000S   .  Sepsis ([place]) A41.9   . Bacteremia R78.81   . Septic shock (HCC) A41.9, R65.21   . Compression fracture of T6 vertebra (HCC) S22.050A   . Hx of kyphoplasty, T8 Z98.890   . Localized osteoporosis with current pathological fracture M80.80XA       Past Medical History:        Diagnosis Date   . Anxiety    . Bipolar 1 disorder ([place])    . CHF (congestive heart failure) ([place])    . COPD (chronic obstructive pulmonary disease) ([place])    . DDD (degenerative disc disease), cervical    . GERD (gastroesophageal reflux disease)    . H/O aortic valve replacement    . History of transient ischemic attack (TIA)    . Mitral regurgitation and mitral stenosis    . PTSD (post-traumatic stress disorder)    . Scoliosis    . Vitamin D deficiency        Past Surgical History:        Procedure Laterality Date   . CARDIAC CATHETERIZATION     . CARDIAC VALVE SURGERY      mitral valve replacement   . CESAREAN SECTION     . HAND/FINGER SURGERY UNLISTED Right     I&D right middle finger   . HYSTERECTOMY (CERVIX STATUS UNKNOWN)     . KNEE SURGERY     . OVARY REMOVAL         Social History:    Social History     Tobacco Use   .  Smoking status: Every Day     Packs/day: .75     Types: Cigarettes     Passive exposure: Current   . Smokeless tobacco: Never   Substance Use Topics   . Alcohol use: Never                                Ready to quit: Not Answered  Counseling given: Not Answered      Vital Signs (Current): There were no vitals filed for this visit.                                           BP Readings from Last 3 Encounters:   01/18/22 109/61   12/07/21 (!) 138/58   12/05/21 (!) 141/72       NPO Status:                                                                                 BMI:   Wt Readings from Last 3 Encounters:   01/18/22 72.6 kg (160 lb)   12/29/21 78.3 kg (172 lb 9.6 oz)   12/06/21 81.6 kg (180 lb)     There is no height or weight on file to calculate BMI.    CBC:   Lab Results   Component Value Date/Time    WBC 8.0 12/06/2021 10:07 PM    RBC 4.65 12/06/2021 10:07 PM    HGB 13.8 12/06/2021 10:07 PM  HCT 41.5 12/06/2021 10:07 PM    MCV 89.2 12/06/2021 10:07 PM    RDW 14.2 12/06/2021 10:07 PM    PLT 224 12/06/2021 10:07 PM       CMP:   Lab Results   Component Value Date/Time    NA 138 12/06/2021 10:07 PM    K 3.4 12/06/2021 10:07 PM    CL 107 12/06/2021 10:07 PM    CO2 27 12/06/2021 10:07 PM    BUN 10 12/06/2021 10:07 PM    CREATININE 1.06 12/06/2021 10:07 PM    GFRAA >60 12/09/2020 09:54 AM    AGRATIO 1.1 12/09/2020 09:54 AM    LABGLOM >60 12/06/2021 10:07 PM    GLUCOSE 86 12/06/2021 10:07 PM    PROT 6.4 12/05/2021 01:07 PM    CALCIUM 9.6 12/06/2021 10:07 PM    BILITOT 0.4 12/05/2021 01:07 PM    ALKPHOS 59 12/05/2021 01:07 PM    ALKPHOS 56 12/09/2020 09:54 AM    AST 20 12/05/2021 01:07 PM    ALT 19 12/05/2021 01:07 PM       POC Tests: No results for input(s): "POCGLU", "POCNA", "POCK", "POCCL", "POCBUN", "POCHEMO", "POCHCT" in the last 72 hours.    Coags:   Lab Results   Component Value Date/Time    PROTIME 20.4 12/05/2021 01:41 PM    INR 2.40 01/19/2022 12:00 AM    INR 2.2 04/07/2020 09:20 AM    APTT 46.2 05/04/2020  10:48 AM       HCG (If Applicable): No results found for: "PREGTESTUR", "PREGSERUM", "HCG", "HCGQUANT"     ABGs:   Lab Results   Component Value Date/Time    PHART 7.48 05/08/2020 04:35 AM    PO2ART 64 05/08/2020 04:35 AM    PCO2ART 26.9 05/08/2020 04:35 AM    HCO3ART 19.9 05/08/2020 04:35 AM    BEART 0.7 05/03/2020 05:03 PM        Type & Screen (If Applicable):  No results found for: "LABABO", "LABRH"    Drug/Infectious Status (If Applicable):  No results found for: "HIV", "HEPCAB"    COVID-19 Screening (If Applicable):   Lab Results   Component Value Date/Time    COVID19 Not detected 08/23/2021 10:00 PM           Anesthesia Evaluation  Patient summary reviewed and Nursing notes reviewed  Airway: Mallampati: II  TM distance: >3 FB   Neck ROM: full  Mouth opening: > = 3 FB   Dental:    (+) edentulous      Pulmonary:normal exam    (+) COPD: moderate,  asthma: current smoker          Patient smoked on day of surgery.                 Cardiovascular:  Exercise tolerance: no interval change,   (+) valvular problems/murmurs: MR and AI, CHF:,       ECG reviewed  Rhythm: regular  Rate: normal  Echocardiogram reviewed               ROS comment: Pt had MVR, tissue valve. Hx of Afib, had clip ([place]) procedure. Now SR or SB. Off coumadin since Sunday (4 days).       Neuro/Psych:   (+) neuromuscular disease:, TIA, psychiatric history:             ROS comment: Compression fx of T10 and T11.   GI/Hepatic/Renal:   (+) GERD: well controlled,  Endo/Other:    (+) : arthritis:., .                 Abdominal: normal exam            Vascular:          Other Findings:           Anesthesia Plan      MAC     ASA 3       Induction: intravenous.      Anesthetic plan and risks discussed with patient.      Plan discussed with CRNA.                    Jonette Pesa, MD   03/03/2022

## 2022-03-03 NOTE — H&P (Signed)
History and Physical    Patient: Mary [name]           Sex: female       DOA: 03/03/2022  Date of Birth:  [birth year]      Age:  56 y.o.     LOS:  LOS: 0 days        HPI:     Mary [name] is a 56 y.o. female with T6 and 7 compression fracture presents for T6 and T7 kyphoplasty with anesthesia    Past Medical History:   Diagnosis Date    Anxiety     Bipolar 1 disorder ([place])     CHF (congestive heart failure) (HCC)     COPD (chronic obstructive pulmonary disease) ([place])     DDD (degenerative disc disease), cervical     GERD (gastroesophageal reflux disease)     H/O aortic valve replacement     History of transient ischemic attack (TIA)     Mitral regurgitation and mitral stenosis     PTSD (post-traumatic stress disorder)     Scoliosis     Vitamin D deficiency        Past Surgical History:   Procedure Laterality Date    CARDIAC CATHETERIZATION      CARDIAC VALVE SURGERY      mitral valve replacement    CESAREAN SECTION      HAND/FINGER SURGERY UNLISTED Right     I&D right middle finger    HYSTERECTOMY (CERVIX STATUS UNKNOWN)      KNEE SURGERY      OVARY REMOVAL         Family History   Problem Relation Age of Onset    No Known Problems Mother        Social History     Socioeconomic History    Marital status: Divorced     Spouse name: None    Number of children: None    Years of education: None    Highest education level: None   Tobacco Use    Smoking status: Every Day     Packs/day: .75     Types: Cigarettes     Passive exposure: Current    Smokeless tobacco: Never   Vaping Use    Vaping Use: Never used   Substance and Sexual Activity    Alcohol use: Never    Drug use: Yes     Types: Marijuana Sherrie Mustache)       Prior to Admission medications    Medication Sig Start Date End Date Taking? Authorizing Provider   HYDROcodone-acetaminophen (NORCO) 5-325 MG per tablet Take 1 tablet by mouth every 6 hours as needed for Pain for up to 3 days. Intended supply: 3 days. Take lowest dose possible to manage pain Max Daily Amount: 4  tablets 03/01/22 03/04/22  Creola Corn, MD   warfarin (COUMADIN) 2.5 MG tablet take 1 tablet by mouth once daily 02/25/22   Black, Wallene Huh, APRN - NP   ondansetron (ZOFRAN) 4 MG tablet Take 1 tablet by mouth 3 times daily as needed for Nausea or Vomiting 01/16/22   Creola Corn, MD   alendronate (FOSAMAX) 70 MG tablet  12/27/21   [provider]   Ubrogepant (UBRELVY) 100 MG TABS Take 100 mg by mouth as needed (Migraine) Take 100 mg at onset of headache; can repeat in 2 hours if no change; not to be taken more than 2 days a week. 12/10/21   Jeralyn Bennett, MD  metoprolol succinate (TOPROL XL) 50 MG extended release tablet Take 1 tablet by mouth daily 12/03/21   Black, Talmage Nap, APRN - NP   Acetaminophen (TYLENOL ARTHRITIS PAIN PO) Take by mouth    [provider]   topiramate (TOPAMAX) 25 MG tablet Take 1 tablet by mouth 2 times daily 11/30/21   Danton Clap D, MD   pregabalin (LYRICA) 200 MG capsule Take 1 capsule by mouth 3 times daily for 180 days. 10/15/21 04/13/22  Blount, Gerilyn Nestle, APRN - NP   LORazepam (ATIVAN) 0.5 MG tablet Take 1 tablet by mouth every 6 hours as needed for Anxiety.  Patient not taking: Reported on 12/29/2021    [provider]   ranolazine (RANEXA) 500 MG extended release tablet Take 1 tablet by mouth 2 times daily  Patient not taking: Reported on 11/30/2021 09/24/21   Sandre Kitty, MD   citalopram (CELEXA) 20 MG tablet Take 1 tablet by mouth daily 08/27/21   Reineke-Piper, Roslyn Smiling, MD   pravastatin (PRAVACHOL) 40 MG tablet Take 1 tablet by mouth daily  Patient not taking: Reported on 11/30/2021 08/27/21   Reineke-Piper, Roslyn Smiling, MD   omeprazole (PRILOSEC) 40 MG delayed release capsule Take 1 capsule by mouth daily 08/27/21   Reineke-Piper, Roslyn Smiling, MD   vitamin B-12 (CYANOCOBALAMIN) 1000 MCG tablet Take 1 tablet by mouth daily    [provider]   albuterol sulfate HFA (PROVENTIL;VENTOLIN;PROAIR) 108 (90 Base) MCG/ACT inhaler Inhale 2 puffs into the  lungs every 6 hours as needed 11/19/19   Automatic Reconciliation, Ar   aspirin 81 MG chewable tablet Take 1 tablet by mouth daily    Automatic Reconciliation, Ar   cetirizine (ZYRTEC) 10 MG tablet Take 1 tablet by mouth daily 10/23/19   Automatic Reconciliation, Ar   docusate (COLACE, DULCOLAX) 100 MG CAPS Col-Rite 100 mg capsule   take 1 capsule by mouth twice a day if needed for constipation    Automatic Reconciliation, Ar   fluticasone (FLONASE) 50 MCG/ACT nasal spray 1 spray by nasal route daily 05/09/20   Automatic Reconciliation, Ar   furosemide (LASIX) 40 MG tablet Take 1 tablet by mouth daily as needed    Automatic Reconciliation, Ar       Allergies   Allergen Reactions    Lithium Nausea And Vomiting     Vomiting      Alprazolam Other (See Comments)     Gets suicidal    Dermatitis Antigen      Rash and blisers  Rash and blisers    Diazepam Other (See Comments)     Gets suicidal    Levofloxacin Other (See Comments)     Muscle stiffness and bulging muscles     Tobacco Other (See Comments)     Increasing lung deterioration     Adhesive Tape Rash     blisters       Physical Exam:      There were no vitals taken for this visit.    Physical Exam:  General: A&O x 4, NAD  Heart: RRR  Lungs: Normal work of breathing on room air    Labs Reviewed:  All lab results for the last 24 hours reviewed    Assessment/Plan     The patient is an appropriate candidate to undergo the planned procedure and sedation    Laverta Millstone, PA

## 2022-03-03 NOTE — Procedures (Signed)
RADIOLOGY POST PROCEDURE NOTE     March 03, 2022       11:59 AM     Preoperative Diagnosis:   Subacute osteoporotic T6 and T7 compression fractures.    Postoperative Diagnosis:  Same.    Operator:  Dr. Cleon Dew    Assistant:  None.    Type of Anesthesia: 1% local lidocaine and IV deep sedation per anesthesia.    Procedure/Description: Image guided T6 and T7 kyphoplasty.    Findings:   No bleeding.    Estimated blood Loss: Minimal    Specimen Removed: No    Blood transfusions:  None.    Implants:  None.    Complications: None    Condition: Stable    Discharge Plan: Discharge home if stable and no bleeding 3 hours.  Resume blood thinners if any in 24 hours.    Berneice Heinrich, MD

## 2022-03-03 NOTE — Anesthesia Post-Procedure Evaluation (Signed)
Department of Anesthesiology  Postprocedure Note    Patient: Mary [name]  MRN: [id number]  Birthdate: [birth year]  Date of evaluation: 03/03/2022      Procedure Summary     Date: 03/03/22 Room / Location: [place] ANGIO IR; [facility] CATH LAB    Anesthesia Start: 0349 Anesthesia Stop: 1791    Procedure: IR KYPHOPLASTY THORACIC FIRST LEVEL Diagnosis:       Compression fracture of thoracic vertebra, unspecified thoracic vertebral level, initial encounter ([place])      Vertebral fracture, osteoporotic, initial encounter ([place])      (T6 and T7 kypho)    Scheduled Providers: Jonette Pesa, MD Responsible Provider: Jonette Pesa, MD    Anesthesia Type: MAC ASA Status: 3          Anesthesia Type: No value filed.    Aldrete Phase I: Aldrete Score: 9    Aldrete Phase II: Aldrete Score: 10      Anesthesia Post Evaluation    Patient location during evaluation: PACU  Patient participation: complete - patient participated  Level of consciousness: awake  Airway patency: patent  Nausea & Vomiting: no nausea  Complications: no  Cardiovascular status: blood pressure returned to baseline  Respiratory status: acceptable  Hydration status: euvolemic  Pain management: adequate

## 2022-03-03 NOTE — Discharge Instructions (Signed)
Kyphoplasty: What to Expect at Home  Your Recovery  After kyphoplasty to relieve pain from compression fractures, your back may feel sore where the hollow needle (trocar) went into your back. This should go away in a few days. Most people are able to return to their daily activities within a day.  This care sheet gives you a general idea about how long it will take for you to recover. But each person recovers at a different pace. Follow the steps below to get better as quickly as possible.  How can you care for yourself at home?  Activity    Take it easy for the first 24 hours. Rest when you feel tired. Getting enough sleep will help you recover.     For the first day after the procedure, avoid lifting anything that would make you strain. This may include heavy grocery bags and milk containers, a heavy briefcase or backpack, cat litter or dog food bags, a vacuum cleaner, or a child.   Diet    You can eat your normal diet. If your stomach is upset, try bland, low-fat foods like plain rice, broiled chicken, toast, and yogurt.   Medicines    Your doctor will tell you if and when you can restart your medicines. You will also get instructions about taking any new medicines.     If you stopped taking aspirin or some other blood thinner, your doctor will tell you when to start taking it again.     Be safe with medicines. Take pain medicines exactly as directed.  If the doctor gave you a prescription medicine for pain, take it as prescribed.  If you are not taking a prescription pain medicine, ask your doctor if you can take an over-the-counter medicine.  Do not take two or more pain medicines at the same time unless your doctor told you to. Many pain medicines have acetaminophen, which is Tylenol. Too much acetaminophen (Tylenol) can be harmful.   Incision care    You will have a dressing over the cut (incision). A dressing helps the incision heal and protects it. Your doctor will tell you how to take care of this.    Ice    If you are sore where the needle was inserted, put ice or a cold pack on your back for 10 to 20 minutes at a time. Try to do this every 1 to 2 hours for the next 3 days (when you are awake) or until the swelling goes down. Put a thin cloth between the ice and your skin.   Follow-up care is a key part of your treatment and safety. Be sure to make and go to all appointments, and call your doctor if you are having problems. It's also a good idea to know your test results and keep a list of the medicines you take.  When should you call for help?   Call 911 anytime you think you may need emergency care. For example, call if:    You passed out (lost consciousness).     You have severe trouble breathing.     You have sudden chest pain and shortness of breath, or you cough up blood.     You are unable to move a leg at all.   Call your doctor now or seek immediate medical care if:    You have new or worse symptoms in your legs, belly, or buttocks. Symptoms may include:  Numbness or tingling.  Weakness.  Pain.       You lose bladder or bowel control.     You have signs of infection, such as:  Increased pain, swelling, warmth, or redness.  Red streaks leading from the incision.  Pus draining from the incision.  A fever.   Watch closely for any changes in your health, and be sure to contact your doctor if:    You do not get better as expected.   Where can you learn more?  Go to https://www.bennett.info/ and enter O461 to learn more about "Kyphoplasty: What to Expect at Home."  Current as of: November 30, 2021               Content Version: 13.8   2006-2023 [employer].   Care instructions adapted under license by [facility]. If you have questions about a medical condition or this instruction, always ask your healthcare professional. [place] any warranty or liability for your use of this information.         DISCHARGE SUMMARY from Nurse    PATIENT INSTRUCTIONS:    After  general anesthesia or intravenous sedation, for 24 hours or while taking prescription Narcotics:  Limit your activities  Do not drive and operate hazardous machinery  Do not make important personal or business decisions  Do  not drink alcoholic beverages  If you have not urinated within 8 hours after discharge, please contact your surgeon on call.    Report the following to your surgeon:  Excessive pain, swelling, redness or odor of or around the surgical area  Temperature over 100.5  Nausea and vomiting lasting longer than 4 hours or if unable to take medications  Any signs of decreased circulation or nerve impairment to extremity: change in color, persistent  numbness, tingling, coldness or increase pain  Any questions           These are general instructions for a healthy lifestyle:    No smoking/ No tobacco products/ Avoid exposure to second hand smoke  Surgeon General's Warning:  Quitting smoking now greatly reduces serious risk to your health.    Obesity, smoking, and sedentary lifestyle greatly increases your risk for illness    A healthy diet, regular physical exercise & weight monitoring are important for maintaining a healthy lifestyle    You may be retaining fluid if you have a history of heart failure or if you experience any of the following symptoms:  Weight gain of 3 pounds or more overnight or 5 pounds in a week, increased swelling in our hands or feet or shortness of breath while lying flat in bed.  Please call your doctor as soon as you notice any of these symptoms; do not wait until your next office visit.        The discharge information has been reviewed with the patient.  The patient verbalized understanding.  Discharge medications reviewed with the patient and appropriate educational materials and side effects teaching were provided.  ___________________________________________________________________________________________________________________________________
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
## 2022-03-14 LAB — PROTIME-INR: INR: 4.3

## 2022-03-14 NOTE — Progress Notes (Signed)
Formatting of this note might be different from the original.  LCSW received a call from patient. Patient stated she has located a family care home to reside in that offers her added support. Patient stated her [employer] Case Manager had connected her with this housing option.     Patient indicated she feels this is affordable/stable housing.     LCSW was also notified that Jeronimo Norma [place] resent information on food resources to patient.     Raechel Chute, MSW, ACM, LCSW      Electronically signed by Krystalynn Kitchens, MSW at 03/15/2022  2:28 PM EDT

## 2022-03-15 NOTE — Patient Instructions (Signed)
Left coumadin instructions on patient voicemail        [name] is here today for anticoagulation monitoring for the diagnosis of atrial fibrillation.  Her INR goal is 2.5-3.5 and her current Coumadin dose is 7.5 mg.     Today's findings include an INR of 4.3     Considering [name] past history, todays findings, and per the coumadin policy/protocol, [name] was instructed to take Coumadin as follows,  hold one night: resume 7.5 daily.  She was also instructed to come back in 1 weeks for an INR check.    A full discussion of the nature of anticoagulants has been carried out.  A full discussion of the need for frequent and regular monitoring, precise dosage adjustment and compliance was stressed.  Side effects of potential bleeding were discussed and [name] was instructed to call [phone] if there are any signs of abnormal bleeding.  [name] was instructed to avoid any OTC items containing aspirin or ibuprofen and prior to starting any new OTC products to consult with her physician or pharmacist to ensure no drug interactions are present.  [name] was instructed to avoid any major changes in her general diet and to avoid alcohol consumption.  .      [name] verbalized her understanding of all instructions and will call the office with any questions, concerns, or signs of abnormal bleeding or blood clot.

## 2022-03-16 ENCOUNTER — Ambulatory Visit: Payer: MEDICARE | Attending: Student in an Organized Health Care Education/Training Program | Primary: Family Medicine

## 2022-03-24 ENCOUNTER — Encounter

## 2022-03-24 ENCOUNTER — Ambulatory Visit
Admit: 2022-03-24 | Discharge: 2022-03-24 | Payer: MEDICAID | Attending: Physical Medicine & Rehabilitation | Primary: Family Medicine

## 2022-03-24 DIAGNOSIS — M545 Low back pain, unspecified: Secondary | ICD-10-CM

## 2022-03-24 MED ORDER — BACLOFEN 10 MG PO TABS
10 MG | ORAL_TABLET | Freq: Three times a day (TID) | ORAL | 0 refills | Status: DC | PRN
Start: 2022-03-24 — End: 2022-05-23

## 2022-03-24 MED ORDER — KETOROLAC TROMETHAMINE 15 MG/ML IJ SOLN
15 MG/ML | Freq: Once | INTRAMUSCULAR | Status: AC
Start: 2022-03-24 — End: 2022-03-24
  Administered 2022-03-24: 21:00:00 15 mg via INTRAMUSCULAR

## 2022-03-24 MED ORDER — KETOROLAC TROMETHAMINE 15 MG/ML IJ SOLN
15 | Freq: Once | INTRAMUSCULAR | Status: AC | PRN
Start: 2022-03-24 — End: 2022-03-25

## 2022-03-24 MED ORDER — PREGABALIN 200 MG PO CAPS
200 MG | ORAL_CAPSULE | Freq: Three times a day (TID) | ORAL | 5 refills | Status: DC
Start: 2022-03-24 — End: 2022-05-23

## 2022-03-24 NOTE — Progress Notes (Signed)
Consent was explained to the pt and signed. No questions or concerns voiced at this time. Pt given 15mg /0.64ml of toradol IM in left gluteus. No sign or symptoms of infection noted at injection site. There was no bleeding, swelling or leaking noted after injection. Pt handed injection information sheet to take home. Patient ambulated to the check-out desk without any complications.
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
## 2022-03-24 NOTE — Progress Notes (Signed)
Mary [name] presents today for   Chief Complaint   Patient presents with    Lower Back Pain       Is someone accompanying this pt? no    Is the patient using any DME equipment during OV? no      Coordination of Care:  1. Have you been to the ER, urgent care clinic since your last visit? no  Hospitalized since your last visit? no    2. Have you seen or consulted any other health care providers outside of the [facility] since your last visit? Yes, PCP Include any pap smears or colon screening. no

## 2022-03-30 LAB — PROTIME-INR: INR: 1.3

## 2022-03-30 NOTE — Patient Instructions (Signed)
Left voicemail [phone] regarding warfarin instructions       Ms. Jaime Dome is here today for anticoagulation monitoring for the diagnosis of mechanical mitral valve.  Her INR goal is 2.5-3.5 and her current Coumadin dose is 7.5mg  nightly.     Today's findings include an INR of 1.3     Considering Ms. Shontz past history, todays findings, and per the coumadin policy/protocol, Ms. Karch was instructed to take Coumadin as follows,  12.5mg  03/30/22-03/31/22.  She was also instructed to come back in 2 days for an INR check.    A full discussion of the nature of anticoagulants has been carried out.  A full discussion of the need for frequent and regular monitoring, precise dosage adjustment and compliance was stressed.  Side effects of potential bleeding were discussed and Ms. Adcox was instructed to call [phone] if there are any signs of abnormal bleeding.  Ms. Valko was instructed to avoid any OTC items containing aspirin or ibuprofen and prior to starting any new OTC products to consult with her physician or pharmacist to ensure no drug interactions are present.  Ms. Keetch was instructed to avoid any major changes in her general diet and to avoid alcohol consumption.  .      Ms. Jersey verbalized her understanding of all instructions and will call the office with any questions, concerns, or signs of abnormal bleeding or blood clot.

## 2022-04-06 ENCOUNTER — Ambulatory Visit
Admit: 2022-04-06 | Discharge: 2022-04-06 | Payer: MEDICARE | Attending: Cardiovascular Disease | Primary: Family Medicine

## 2022-04-06 ENCOUNTER — Telehealth

## 2022-04-06 DIAGNOSIS — E785 Hyperlipidemia, unspecified: Secondary | ICD-10-CM

## 2022-04-06 LAB — PROTIME-INR: INR: 1.4

## 2022-04-06 MED ORDER — PRAVASTATIN SODIUM 40 MG PO TABS
40 MG | ORAL_TABLET | Freq: Every day | ORAL | 2 refills | Status: DC
Start: 2022-04-06 — End: 2022-05-23

## 2022-04-06 MED ORDER — WARFARIN SODIUM 2.5 MG PO TABS
2.5 MG | ORAL_TABLET | Freq: Every day | ORAL | 2 refills | Status: DC
Start: 2022-04-06 — End: 2022-05-23

## 2022-04-06 MED ORDER — NITROGLYCERIN 0.4 MG SL SUBL
0.4 MG | ORAL_TABLET | SUBLINGUAL | 3 refills | Status: AC | PRN
Start: 2022-04-06 — End: 2022-05-08

## 2022-04-06 NOTE — Telephone Encounter (Signed)
Patient called to schedule a follow up for increased pain after kyphoplasty last month.  Patient was seen by [place] 11/9 and saw her heart doctor today.  She reports having "something sticking out of her chest" and that she fractures easily.  Next available appointment is not until January.  I advised patient this information would be forwarded to her doctor for further instruction.  Patient can be reached at [phone].

## 2022-04-06 NOTE — Patient Instructions (Signed)
Mary [name] is here today for anticoagulation monitoring for the diagnosis of mechanical mitral valve. .  Her INR goal is 2.5-3.5 and her current Coumadin dose is  12.5mg  03/30/22-03/31/22: 7.5 mg alt 5 mg .     Today's findings include an INR of 1.4     Considering [name] past history, todays findings, and per the coumadin policy/protocol, [name] was instructed to take Coumadin as follows, 12.5 11/22-11/24: 7.5 mg Saturday/Monday 10 mg on sunday.  She was also instructed to come back in 1 weeks for an INR check.    A full discussion of the nature of anticoagulants has been carried out.  A full discussion of the need for frequent and regular monitoring, precise dosage adjustment and compliance was stressed.  Side effects of potential bleeding were discussed and [name] was instructed to call [phone] if there are any signs of abnormal bleeding.  [name] was instructed to avoid any OTC items containing aspirin or ibuprofen and prior to starting any new OTC products to consult with her physician or pharmacist to ensure no drug interactions are present.  [name] was instructed to avoid any major changes in her general diet and to avoid alcohol consumption.  .      [name] verbalized her understanding of all instructions and will call the office with any questions, concerns, or signs of abnormal bleeding or blood clot.

## 2022-04-06 NOTE — Progress Notes (Signed)
Patient did not bring medications with her today. Tried to go over the medications verbally with patient.   Have you had Fatigue?  No   2.   Have you had have you had Chest Pain? Yes if so how long: on and off for a year how frequent: most ever day . Is         3.   Have you had Dyspnea (SOB) ? Yes if so how long: patient states she has COPD  can you walk 2 blocks or a flight of stairs without SOB: no    4.   Have you had Orthopnea? No   5.   Have you had PND? No    6.   Have you had leg swelling? No   7.    Have you had any weight gain? No i    8. Have you had any palpitations? No    9. Have you had any syncope? No   10. Do you have any wounds on legs? no

## 2022-04-06 NOTE — Progress Notes (Signed)
HISTORY OF PRESENT ILLNESS  Mary [name] is a 56 y.o. female.    PMH History of Mitral valve replacement Bioprosthetic, CHF, Bipolar, Anxiety, Left atrial clip,   -----  CARDIAC STUDIES  -----  2d tte 08/06/2021    Left Ventricle: Normal left ventricular systolic function with a visually estimated EF of 55 - 60%. Left ventricle is mildly dilated. Increased wall thickness. See diagram for wall motion findings. Diastolic function present with increased LAP with normal LV EF.    Right Ventricle: Right ventricle is mildly dilated. RVIDd is 4.3 cm.    Aortic Valve: Moderate regurgitation with a centrally directed jet. Vena contracta measures 0.5 cm.    Mitral Valve: Bioprosthetic valve that is well-seated with normal leaflet motion. MV mean gradient is 7 mmHg. Moderate regurgitation.    Left Atrium: Left atrium is mildly dilated. Left atrial volume index is mildly increased (35-41 mL/m2). LA Vol Index A/L is 38 mL/m2.    Pulmonary Arteries: Pulmonary hypertension not present. The estimated PASP is 30 mmHg.  -----  LHC 04/2020  Mild single vessel coronary artery disease with preserved LV function.  Elevated LVEDP.  Recommend intense risk factor modification.  -----  Vascular duplex upper extremity venous left   Acute non-occlusive deep vein thrombosis in 1 of 2 brachial veins within the left upper extremity.   Superficial acute non-occlusive thrombosis in the cephalic and basilic veins within the left upper extremity.  ----  Nuclear stress 08/2021  Low Risk Stress Test  No significant resting or reversible defects present  Normal ejection fraction 74% without significant TID 0.88  SSS = 0, SRS = 2, SDS = -2  ----  Cardiac telemetry 11/12/2021  Patient monitored for 14d 12h starting on 09/25/2021 09:59 am.     Primary rhythm was Sinus Rhythm. Average heart rate was 62 bpm, Minimum heart rate was 43 bpm on Day 11  / 08:06:09 am, Max heart rate was 139 bpm on Day 7 / 04:57:50 pm     Atrial Fibrillation or Flutter: Burden was  0 %, longest event 0 ms on --, fastest rate -- bpm on --.     SVE(s): Burden was 0.06 %, max count per 24 hours 56  SVT (AT, RT): 43 events, longest event 11 beats on Day 5 / 05:09:52 am, fastest event 169 bpm on Day 1 /  04:48:18 pm     Pause: 1 events, longest pause 2000 ms on Day 11 / 08:06:15 am  AV Block: 0 %, most severe block demonstrated --     PVC(s): Burden was < 0.01 %, max count per 24 hours 3, 2 disparate morphologies  Ventricular Tachycardia: 0 events, longest event 0 s at --, fastest rate -- bpm at --     Patient recorded 1 events during the monitoring period Occurred with sinus bradycardia and artifact 58bpm  ----    Patient did not bring medications with her today. Tried to go over the medications verbally with patient.   Have you had Fatigue?  No   2.   Have you had have you had Chest Pain? Yes if so how long: on and off for a year how frequent: most ever day . Patient states that can happen with exertion and stress can sometimes be pressure or sharp.  Can be achy and relieved by rest at least mild.    3.   Have you had Dyspnea (SOB) ? Yes if so how long: patient states she has COPD  can  you walk 2 blocks or a flight of stairs without SOB: no     4.   Have you had Orthopnea? No   5.   Have you had PND? No     6.   Have you had leg swelling? No   7.    Have you had any weight gain? No i     8. Have you had any palpitations? No    9. Have you had any syncope? No   10. Do you have any wounds on legs? no    Family History   Problem Relation Age of Onset    No Known Problems Mother        Past Medical History:   Diagnosis Date    Anxiety     Bipolar 1 disorder (HCC)     CHF (congestive heart failure) (HCC)     COPD (chronic obstructive pulmonary disease) (HCC)     DDD (degenerative disc disease), cervical     GERD (gastroesophageal reflux disease)     H/O aortic valve replacement     History of transient ischemic attack (TIA)     Mitral regurgitation and mitral stenosis     PTSD (post-traumatic stress  disorder)     Scoliosis     Vitamin D deficiency        Past Surgical History:   Procedure Laterality Date    CARDIAC CATHETERIZATION      CARDIAC VALVE SURGERY      mitral valve replacement    CESAREAN SECTION      HAND/FINGER SURGERY UNLISTED Right     I&D right middle finger    HYSTERECTOMY (CERVIX STATUS UNKNOWN)      IR KYPHOPLASTY THORACIC FIRST LEVEL  03/03/2022    IR KYPHOPLASTY THORACIC FIRST LEVEL 03/03/2022 Samoilov, Dmitri E, MD [facility] RAD ANGIO IR    KNEE SURGERY      OVARY REMOVAL         Social History     Tobacco Use    Smoking status: Every Day     Packs/day: .75     Types: Cigarettes     Passive exposure: Current    Smokeless tobacco: Never   Substance Use Topics    Alcohol use: Never       Allergies   Allergen Reactions    Lithium Nausea And Vomiting     Vomiting      Alprazolam Other (See Comments)     Gets suicidal    Dermatitis Antigen      Rash and blisers  Rash and blisers    Diazepam Other (See Comments)     Gets suicidal    Levofloxacin Other (See Comments)     Muscle stiffness and bulging muscles     Tobacco Other (See Comments)     Increasing lung deterioration     Adhesive Tape Rash     blisters       Prior to Admission medications    Medication Sig Start Date End Date Taking? Authorizing Provider   Dwyane Luo 100-62.5-25 MCG/ACT AEPB inhaler inhale 1 puff by mouth and INTO THE LUNGS once daily 03/17/22   [provider]   pregabalin (LYRICA) 200 MG capsule Take 1 capsule by mouth 3 times daily for 180 days. 03/24/22 09/20/22  [name], MD   baclofen (LIORESAL) 10 MG tablet Take 0.5-1 tablets by mouth 3 times daily as needed (pain/spasm) 03/24/22   [name], MD   warfarin (COUMADIN) 2.5  MG tablet take 1 tablet by mouth once daily 02/25/22   Black, Myria G, APRN - NP   ondansetron (ZOFRAN) 4 MG tablet Take 1 tablet by mouth 3 times daily as needed for Nausea or Vomiting 01/16/22   Creola Corn, MD   Ubrogepant (UBRELVY) 100 MG TABS Take 100 mg by mouth as needed  (Migraine) Take 100 mg at onset of headache; can repeat in 2 hours if no change; not to be taken more than 2 days a week. 12/10/21   Janeece Fitting D, MD   metoprolol succinate (TOPROL XL) 50 MG extended release tablet Take 1 tablet by mouth daily 12/03/21   Black, Wallene Huh, APRN - NP   Acetaminophen (TYLENOL ARTHRITIS PAIN PO) Take by mouth    [provider]   citalopram (CELEXA) 20 MG tablet Take 1 tablet by mouth daily 08/27/21   Reineke-Piper, Donnita [place], MD   omeprazole (PRILOSEC) 40 MG delayed release capsule Take 1 capsule by mouth daily 08/27/21   Reineke-Piper, Donnita [place], MD   vitamin B-12 (CYANOCOBALAMIN) 1000 MCG tablet Take 1 tablet by mouth daily    [provider]   albuterol sulfate HFA (PROVENTIL;VENTOLIN;PROAIR) 108 (90 Base) MCG/ACT inhaler Inhale 2 puffs into the lungs every 6 hours as needed 11/19/19   Automatic Reconciliation, Ar   aspirin 81 MG chewable tablet Take 1 tablet by mouth daily    Automatic Reconciliation, Ar   cetirizine (ZYRTEC) 10 MG tablet Take 1 tablet by mouth daily 10/23/19   Automatic Reconciliation, Ar   docusate (COLACE, DULCOLAX) 100 MG CAPS [facility] 100 mg capsule   take 1 capsule by mouth twice a day if needed for constipation    Automatic Reconciliation, Ar   fluticasone (FLONASE) 50 MCG/ACT nasal spray 1 spray by nasal route daily 05/09/20   Automatic Reconciliation, Ar   furosemide (LASIX) 40 MG tablet Take 1 tablet by mouth daily as needed    Automatic Reconciliation, Ar       No results found for: "LIPIDPAN", "BMP", "CMP"     There were no vitals taken for this visit.    HPI    Review of Systems   Constitutional:  Negative for activity change, appetite change, diaphoresis, fatigue and unexpected weight change.   Eyes:  Negative for visual disturbance.   Respiratory:  Positive for chest tightness and shortness of breath. Negative for apnea, cough and wheezing.    Cardiovascular:  Negative for chest pain, palpitations and leg swelling.   Gastrointestinal:   Negative for abdominal pain, blood in stool, constipation, diarrhea and nausea.   Endocrine: Negative for cold intolerance and heat intolerance.   Genitourinary:  Negative for decreased urine volume and difficulty urinating.   Musculoskeletal:  Negative for gait problem, myalgias and neck pain.   Skin:  Negative for color change, pallor and rash.   Neurological:  Negative for dizziness, syncope, facial asymmetry, speech difficulty, weakness, light-headedness and numbness.   Psychiatric/Behavioral:  Negative for confusion and sleep disturbance.          Physical Exam  Vitals reviewed.   Constitutional:       General: She is not in acute distress.     Appearance: Normal appearance. She is not ill-appearing or diaphoretic.   HENT:      Head: Normocephalic and atraumatic.   Eyes:      General: No scleral icterus.        Right eye: No discharge.  Left eye: No discharge.      Conjunctiva/sclera: Conjunctivae normal.   Neck:      Vascular: No carotid bruit.   Cardiovascular:      Rate and Rhythm: Normal rate and regular rhythm.      Heart sounds: Normal heart sounds. No murmur heard.     No friction rub. No gallop.   Pulmonary:      Effort: Pulmonary effort is normal. No respiratory distress.      Breath sounds: Normal breath sounds. No wheezing, rhonchi or rales.   Chest:      Chest wall: No tenderness.   Abdominal:      General: Bowel sounds are normal. There is no distension.      Palpations: Abdomen is soft.      Tenderness: There is no abdominal tenderness. There is no guarding.   Musculoskeletal:         General: No swelling or tenderness.      Cervical back: Neck supple.      Right lower leg: No edema.      Left lower leg: No edema.   Skin:     General: Skin is warm and dry.      Findings: No erythema or rash.   Neurological:      Mental Status: She is alert. Mental status is at baseline.      Motor: No weakness.      Gait: Gait normal.   Psychiatric:         Mood and Affect: Mood normal.         Behavior:  Behavior normal.         Thought Content: Thought content normal.         ASSESSMENT and PLAN  Ms. Pennock has a reminder for a "due or due soon" health maintenance. I have asked that she contact her primary care provider for follow-up on this health maintenance.     Chronic heart failure preserved ejection fraction - Monitor fluid status, adjust as necessary, fluid restrication 2L/day, salt intake <2g per day. Currently on Lasix 40 mg p.o. daily as needed weight gain 2 to 3 pounds     Chest pain -   Low risk stress test 08/2021 but with continued smoking, sent ranexa 500mg  po bid, sent Nitro PRN. Patient advised that if increasing frequency duration and intensity of discomfort or unrelieved discomfort to call EMS to visit emergency department.  Check Cardiac CTA evaluate for obstructive CAD with disease accelerating factors.  If increasing frequency duration intensity to visit emergency department.       Moderate aortic regurgitation - Survey periodically 1-2 years.      Mitral valve bioprosthetic with mean gradient 7 mmHg 08/02/2021 - Survey periodically 1-2 years.      Moderate mitral regurgitation - Survey periodically 1-2 years.      HLD - Continue pravastatin 40mg  po daily, survey lipid panel periodically.       Atrial fibrillation - On coumadin, requests refill, INR 2-3 Check for primary prevention of stroke.  Refill.  Metoprolol succinate 50mg  po daily.       HTN - Normotensive today, metoprolol succinate 50mg  po daily,

## 2022-04-06 NOTE — Patient Instructions (Signed)
Learning About the Mediterranean Diet  What is the Mediterranean diet?     The Mediterranean diet is a style of eating rather than a diet plan. It features foods eaten in [place], [place], southern [place] and [place], and other countries along the [place] Sea. It emphasizes eating foods like fish, fruits, vegetables, beans, high-fiber breads and whole grains, nuts, and olive oil. This style of eating includes limited red meat, cheese, and sweets.  Why choose the Mediterranean diet?  A Mediterranean-style diet may improve heart health. It contains more fat than other heart-healthy diets. But the fats are mainly from nuts, unsaturated oils (such as fish oils and olive oil), and certain nut or seed oils (such as canola, soybean, or flaxseed oil). These fats may help protect the heart and blood vessels.  How can you get started on the Mediterranean diet?  Here are some things you can do to switch to a more Mediterranean way of eating.  What to eat  Eat a variety of fruits and vegetables each day, such as grapes, blueberries, tomatoes, broccoli, peppers, figs, olives, spinach, eggplant, beans, lentils, and chickpeas.  Eat a variety of whole-grain foods each day, such as oats, brown rice, and whole wheat bread, pasta, and couscous.  Eat fish at least 2 times a week. Try tuna, salmon, mackerel, lake trout, herring, or sardines.  Eat moderate amounts of low-fat dairy products, such as milk, cheese, or yogurt.  Eat moderate amounts of poultry and eggs.  Choose healthy (unsaturated) fats, such as nuts, olive oil, and certain nut or seed oils like canola, soybean, and flaxseed.  Limit unhealthy (saturated) fats, such as butter, palm oil, and coconut oil. And limit fats found in animal products, such as meat and dairy products made with whole milk. Try to eat red meat only a few times a month in very small amounts.  Limit sweets and desserts to only a few times a week. This includes sugar-sweetened drinks like soda.  The  Mediterranean diet may also include red wine with your meal--1 glass each day for women and up to 2 glasses a day for men.  Tips for eating at home  Use herbs, spices, garlic, lemon zest, and citrus juice instead of salt to add flavor to foods.  Add avocado slices to your sandwich instead of bacon.  Have fish for lunch or dinner instead of red meat. Brush the fish with olive oil, and broil or grill it.  Sprinkle your salad with seeds or nuts instead of cheese.  Cook with olive or canola oil instead of butter or oils that are high in saturated fat.  Switch from 2% milk or whole milk to 1% or fat-free milk.  Dip raw vegetables in a vinaigrette dressing or hummus instead of dips made from mayonnaise or sour cream.  Have a piece of fruit for dessert instead of a piece of cake. Try baked apples, or have some dried fruit.  Tips for eating out  Try broiled, grilled, baked, or poached fish instead of having it fried or breaded.  Ask your server to have your meals prepared with olive oil instead of butter.  Order dishes made with marinara sauce or sauces made from olive oil. Avoid sauces made from cream or mayonnaise.  Choose whole-grain breads, whole wheat pasta and pizza crust, brown rice, beans, and lentils.  Cut back on butter or margarine on bread. Instead, you can dip your bread in a small amount of olive oil.  Ask for a side   salad or grilled vegetables instead of french fries or chips.  Where can you learn more?  Go to https://www.healthwise.net/patientEd and enter O407 to learn more about "Learning About the Mediterranean Diet."  Current as of: Sep 21, 2020               Content Version: 13.5   2006-2022 [employer].   Care instructions adapted under license by [facility]. If you have questions about a medical condition or this instruction, always ask your healthcare professional. [employer] disclaims any warranty or liability for your use of this information.

## 2022-04-06 NOTE — Telephone Encounter (Signed)
Ordered new xrays to eval for new fx.    Needs to get referral to PM clinic from her PCP. They can Rx long term stronger pain meds than we can.

## 2022-04-06 NOTE — Telephone Encounter (Signed)
Called patient identified by two verifiers. Explained  Ordered new xrays to eval for new fx.     Needs to get referral to PM clinic from her PCP. They can Rx long term stronger pain meds than we can. PT verbalized understanding and thanked me for the call

## 2022-04-06 NOTE — Telephone Encounter (Signed)
Called patient received voicemail left message to call the office back. # provided.

## 2022-04-14 ENCOUNTER — Ambulatory Visit: Payer: MEDICARE | Primary: Family Medicine

## 2022-04-14 LAB — PROTIME-INR: INR: 3.2

## 2022-04-14 NOTE — Patient Instructions (Signed)
Left message on patient voicemail with instructions and to call if she need further clarification         [name] is here today for anticoagulation monitoring for the diagnosis of .mechanical mitral valve  Her INR goal is 2.5-3.5 and her current Coumadin dose is 12.5 11/22-11/24: 7.5 mg Saturday/Monday 10 mg on sunday. .     Today's findings include an INR of 3.2     Considering Mary [name] past history, todays findings, and per the coumadin policy/protocol, Mary [name] was instructed to take Coumadin as follows,  10 mg on Thursday/Sunday; 7.5 mg all other days.  She was also instructed to come back in 1 weeks for an INR check.    A full discussion of the nature of anticoagulants has been carried out.  A full discussion of the need for frequent and regular monitoring, precise dosage adjustment and compliance was stressed.  Side effects of potential bleeding were discussed and Mary [name] was instructed to call [phone] if there are any signs of abnormal bleeding.  Mary [name] was instructed to avoid any OTC items containing aspirin or ibuprofen and prior to starting any new OTC products to consult with her physician or pharmacist to ensure no drug interactions are present.  Mary [name] was instructed to avoid any major changes in her general diet and to avoid alcohol consumption.  .      Mary [name] verbalized her understanding of all instructions and will call the office with any questions, concerns, or signs of abnormal bleeding or blood clot.

## 2022-04-25 LAB — PROTIME-INR: INR: 5

## 2022-04-26 ENCOUNTER — Ambulatory Visit: Payer: MEDICARE | Primary: Family Medicine

## 2022-04-26 NOTE — Patient Instructions (Signed)
LEFT VOICEMAIL [phone] WITH WARFARIN INSTRUCTIONS      Ms. Porshea Janowski is here today for anticoagulation monitoring for the diagnosis of mechanical mitral valve.  Her INR goal is 2.5-3.5 and her current Coumadin dose is 10MG  SUN. THURS. OTHERWISE; 7.5MG  A.O.D     Today's findings include an INR of 5.0     Considering Ms. Slaven past history, todays findings, and per the coumadin policy/protocol, Ms. Tranchina was instructed to take Coumadin as follows,  HOLD 04/25/22-04/26/22 OTHERWISE; 7.5MG  NIGHTLY.  She was also instructed to come back in 1 weeks for an INR check.    A full discussion of the nature of anticoagulants has been carried out.  A full discussion of the need for frequent and regular monitoring, precise dosage adjustment and compliance was stressed.  Side effects of potential bleeding were discussed and Ms. Fakhouri was instructed to call [phone] if there are any signs of abnormal bleeding.  Ms. Lizaola was instructed to avoid any OTC items containing aspirin or ibuprofen and prior to starting any new OTC products to consult with her physician or pharmacist to ensure no drug interactions are present.  Ms. Skalsky was instructed to avoid any major changes in her general diet and to avoid alcohol consumption.  .      Ms. Weesner verbalized her understanding of all instructions and will call the office with any questions, concerns, or signs of abnormal bleeding or blood clot.

## 2022-05-02 LAB — PROTIME-INR
INR: 1.3
INR: 1.3

## 2022-05-02 NOTE — Patient Instructions (Signed)
LEFT VOICEMAIL [phone] FOR PATIENT REGARDING WARFARIN INSTRUCTIONS BELOW.      Ms. [name] is here today for anticoagulation monitoring for the diagnosis of mechanical mitral valve.  Her INR goal is 2.5-3.5 and her current Coumadin dose is 7.5MG .     Today's findings include an INR of 1.3     Considering Ms. Lizardi past history, todays findings, and per the coumadin policy/protocol, Ms. Wipperfurth was instructed to take Coumadin as follows,  CONTINUE CURRENT REGIMEN.  She was also instructed to come back in 1 weeks for an INR check.    A full discussion of the nature of anticoagulants has been carried out.  A full discussion of the need for frequent and regular monitoring, precise dosage adjustment and compliance was stressed.  Side effects of potential bleeding were discussed and Ms. Biscardi was instructed to call [phone] if there are any signs of abnormal bleeding.  Ms. Vantassell was instructed to avoid any OTC items containing aspirin or ibuprofen and prior to starting any new OTC products to consult with her physician or pharmacist to ensure no drug interactions are present.  Ms. Hellmer was instructed to avoid any major changes in her general diet and to avoid alcohol consumption.  .      Ms. Borin verbalized her understanding of all instructions and will call the office with any questions, concerns, or signs of abnormal bleeding or blood clot.

## 2022-05-03 ENCOUNTER — Encounter

## 2022-05-04 ENCOUNTER — Inpatient Hospital Stay
Admission: EM | Admit: 2022-05-04 | Discharge: 2022-05-08 | Disposition: A | Discharge status: Home Health | Payer: PRIVATE HEALTH INSURANCE | Admitting: Student in an Organized Health Care Education/Training Program

## 2022-05-04 ENCOUNTER — Emergency Department: Admit: 2022-05-04 | Payer: PRIVATE HEALTH INSURANCE | Primary: Family Medicine

## 2022-05-04 DIAGNOSIS — G9340 Encephalopathy, unspecified: Secondary | ICD-10-CM

## 2022-05-04 DIAGNOSIS — F319 Bipolar disorder, unspecified: Secondary | ICD-10-CM

## 2022-05-04 LAB — CBC WITH AUTO DIFFERENTIAL
Basophils %: 0 % (ref 0–2)
Basophils Absolute: 0 10*3/uL (ref 0.0–0.1)
Eosinophils %: 0 % (ref 0–5)
Eosinophils Absolute: 0 10*3/uL (ref 0.0–0.4)
Hematocrit: 50.3 % — ABNORMAL HIGH (ref 35.0–45.0)
Hemoglobin: 16.1 g/dL — ABNORMAL HIGH (ref 12.0–16.0)
Immature Granulocytes %: 0 % (ref 0.0–0.5)
Immature Granulocytes Absolute: 0 10*3/uL (ref 0.00–0.04)
Lymphocytes %: 19 % — ABNORMAL LOW (ref 21–52)
Lymphocytes Absolute: 1.9 10*3/uL (ref 0.9–3.6)
MCH: 30.3 PG (ref 24.0–34.0)
MCHC: 32 g/dL (ref 31.0–37.0)
MCV: 94.7 FL (ref 78.0–100.0)
MPV: 11.6 FL (ref 9.2–11.8)
Monocytes %: 3 % (ref 3–10)
Monocytes Absolute: 0.3 10*3/uL (ref 0.05–1.2)
Neutrophils %: 77 % — ABNORMAL HIGH (ref 40–73)
Neutrophils Absolute: 7.5 10*3/uL (ref 1.8–8.0)
Nucleated RBCs: 0 PER 100 WBC
Platelets: 204 10*3/uL (ref 135–420)
RBC: 5.31 M/uL — ABNORMAL HIGH (ref 4.20–5.30)
RDW: 13.1 % (ref 11.6–14.5)
WBC: 9.8 10*3/uL (ref 4.6–13.2)
nRBC: 0 10*3/uL (ref 0.00–0.01)

## 2022-05-04 LAB — COMPREHENSIVE METABOLIC PANEL
ALT: 22 U/L (ref 13–56)
AST: 28 U/L (ref 10–38)
Albumin/Globulin Ratio: 1.3 (ref 0.8–1.7)
Albumin: 4.2 g/dL (ref 3.4–5.0)
Alk Phosphatase: 68 U/L (ref 45–117)
Anion Gap: 6 mmol/L (ref 3.0–18)
BUN/Creatinine Ratio: 18 (ref 12–20)
BUN: 16 MG/DL (ref 7.0–18)
CO2: 24 mmol/L (ref 21–32)
Calcium: 10.2 MG/DL — ABNORMAL HIGH (ref 8.5–10.1)
Chloride: 114 mmol/L — ABNORMAL HIGH (ref 100–111)
Creatinine: 0.87 MG/DL (ref 0.6–1.3)
Est, Glom Filt Rate: >60 mL/min/{1.73_m2} (ref >60)
Globulin: 3.3 g/dL (ref 2.0–4.0)
Glucose: 110 mg/dL — ABNORMAL HIGH (ref 74–99)
Potassium: 4.7 mmol/L (ref 3.5–5.5)
Sodium: 144 mmol/L (ref 136–145)
Total Bilirubin: 0.9 MG/DL (ref 0.2–1.0)
Total Protein: 7.5 g/dL (ref 6.4–8.2)

## 2022-05-04 LAB — TROPONIN: Troponin, High Sensitivity: 6 ng/L (ref 0–54)

## 2022-05-04 LAB — PROTIME-INR
INR: 1.1 (ref 0.9–1.1)
Protime: 14.2 s (ref 11.9–14.7)

## 2022-05-04 LAB — POCT GLUCOSE: POC Glucose: 95 mg/dL (ref 70–110)

## 2022-05-04 MED ORDER — IOPAMIDOL 76 % IV SOLN
76 % | Freq: Once | INTRAVENOUS | Status: AC | PRN
Start: 2022-05-04 — End: 2022-05-04
  Administered 2022-05-04: 19:00:00 80 mL via INTRAVENOUS

## 2022-05-04 MED ORDER — SODIUM CHLORIDE 0.9 % IV BOLUS
0.9 % | Freq: Once | INTRAVENOUS | Status: AC
Start: 2022-05-04 — End: 2022-05-04
  Administered 2022-05-05: 01:00:00 1000 mL via INTRAVENOUS

## 2022-05-04 MED FILL — SODIUM CHLORIDE 0.9 % IV SOLN: 0.9 % | INTRAVENOUS | Qty: 1000

## 2022-05-04 MED FILL — ISOVUE-370 76 % IV SOLN: 76 % | INTRAVENOUS | Qty: 80

## 2022-05-04 NOTE — ED Triage Notes (Signed)
Pt coming from private group home. Last known well was 2100 on 12/19. Baseline is A&Ox4. Caregiver found her at 1300 today unresponsive. Pt is flaccid, vomitting, eyes are open but no spontaenous movements. Physician at the bedside.

## 2022-05-04 NOTE — ED Notes (Signed)
Pt is restless/fidgeting in bed. Keeps removing her monitoring devices. Have reapplied bp cuff, spo2 probe, and cardiac leads 3 times. Pt agrees to wear them. I came back in at 1800 and patient had ripped everything off again including her IV and her gown.

## 2022-05-04 NOTE — ED Notes (Signed)
Report given to Belinda RN at the bedside.

## 2022-05-04 NOTE — ED Notes (Signed)
Pt was able to stand at the bedside for about 30 seconds holding my hand for balance. Pt became dizzy and felt like she was going to fall. She placed herself back in bed and refused to re-attempt. Call bell within reach, bed set in lowest position, and pulse oximeter probe applied.

## 2022-05-04 NOTE — ED Notes (Signed)
Pt is awake, alert, follows commands. The pt is delayed, sluggish, lethargic, and still unable to speak. Pt nodded her head yes that she normally talks and nodded yes when asked if she felt like she couldn't talk or that talking was very difficult at this time. When we performed a nail bed prick for sensory testing the patient shouted "No." Pt is now able to use both arms to sit up in the bed slowly. She shakes her head no when asked if she feels okay and begins to weep.

## 2022-05-04 NOTE — ED Notes (Signed)
Assumed care of pt. Pt alert to self. Speech is garbled and pt only speaking name. Pt suspected stroke. Found unresponsive at 1300 at home. Pt moving all extremities freely. Not following commands. Failed swallow screening due to lack of following commands. Pt continues to pull at lines and pull out IV. IV replaced. On half monitor. Vitals stable pt strait cathed for urine.

## 2022-05-04 NOTE — ED Notes (Signed)
Pt is awake and alert, still unable to answer questions but follows commands.    Side rails raised x2.

## 2022-05-04 NOTE — ED Notes (Signed)
Swallow screen deferred because pt unable to remain alert. Tele Neurology consult completed with Dr. Nena Jordan at [phone]. NIH score 16. Pt is becoming more alert and is slowly regaining control over affected limbs.

## 2022-05-04 NOTE — ED Notes (Signed)
CT head and CT-A head/neck completed.  Patient is currently in CT holding area, awaiting tele neurologist's evaluation.  She is currently awake, non-verbal, minimal command following.  Assessment in progress.

## 2022-05-04 NOTE — ED Notes (Signed)
Still no tele neurologist on the tele neuro robot in CT holding area.  Will transport patient back to the ED, and will contact [facility] call center to inform the tele neurologist to evaluate patient in the ED.  The nurse will place the tele neuro robot at bedside upon return to the ED.

## 2022-05-04 NOTE — Progress Notes (Signed)
[facility] Dosing Services    Consult for Warfarin Dosing by Pharmacy by Dr. Jacqulyn Liner, Gwyndolyn Saxon  Consult provided for this 56 y.o.  female , for indication of Heart Valve Replacement (Aortic valve)    Dose to achieve an INR goal of 2.0 to 3.0 (confirmed with Dr. Jacqulyn Liner)  Home dose: 10 mg (2.5 mg x 4) every Tue, Sat; 7.5 mg (2.5 mg x 3) all other days     Warfarin  7.5 (mg) to be given today at 2300.     DATE INR DOSE   12/20 1.1 7.5 mg       Significant drug interactions: None  PT/INR Lab Results   Component Value Date/Time    INR 1.1 05/04/2022 01:48 PM    INR 2.2 04/07/2020 09:20 AM    INREXT 2.0 05/27/2021 12:00 AM       Platelets Lab Results   Component Value Date/Time    PLT 204 05/04/2022 01:48 PM      H/H Lab Results   Component Value Date/Time    HGB 16.1 05/04/2022 01:48 PM        Pharmacy to follow daily and will provide subsequent Warfarin dosing based on clinical status.     Signed Deuce Paternoster Lengby, [place]

## 2022-05-04 NOTE — ED Notes (Signed)
Taking pt to CT on monitor.

## 2022-05-04 NOTE — ED Provider Notes (Signed)
EMERGENCY DEPARTMENT HISTORY AND PHYSICAL EXAM    1:54 PM EST seen on arrival in the hallway on EMS stretcher        Date: 05/04/2022  Patient Name: Mary [name]    History of Presenting Illness     Chief Complaint   Patient presents with    Loss of Consciousness     Pt found unresponsive         History Provided By: patient    Additional History (Context): Mary [name] is a 56 y.o. female presents with patient was last seen 8 PM last night caregiver arrived to find the patient unresponsive on the couch, EMS was contacted.  She is nonverbal blank stare much of the time moves all 4 extremities but only groans to pain does not follow any commands was protecting her airway then vomited on the EMS stretcher did not adjust her posture.  She is on Coumadin.  Concern for drug ingestion or intracranial hemorrhage or a posterior stroke.    PCP: Vincenza Hews, MD    Chief Complaint:   Duration:    Timing:    Location:   Quality:   Severity:   Modifying Factors:   Associated Symptoms:       Current Facility-Administered Medications   Medication Dose Route Frequency Provider Last Rate Last Admin    sodium chloride 0.9 % bolus 1,000 mL  1,000 mL IntraVENous Once Freddie Apley, MD        nitroGLYCERIN (NITROSTAT) SL tablet 0.4 mg  0.4 mg SubLINGual Q5 Min PRN Gaston Islam, MD         Current Outpatient Medications   Medication Sig Dispense Refill    pravastatin (PRAVACHOL) 40 MG tablet Take 1 tablet by mouth daily 90 tablet 2    nitroGLYCERIN (NITROSTAT) 0.4 MG SL tablet Place 1 tablet under the tongue every 5 minutes as needed for Chest pain up to max of 3 total doses. If no relief after 1 dose, call 911. 25 tablet 3    warfarin (COUMADIN) 2.5 MG tablet Take 1 tablet by mouth daily 90 tablet 2    TRELEGY ELLIPTA 100-62.5-25 MCG/ACT AEPB inhaler inhale 1 puff by mouth and INTO THE LUNGS once daily      pregabalin (LYRICA) 200 MG capsule Take 1 capsule by mouth 3 times daily for 180 days. 90 capsule 5     baclofen (LIORESAL) 10 MG tablet Take 0.5-1 tablets by mouth 3 times daily as needed (pain/spasm) 60 tablet 0    ondansetron (ZOFRAN) 4 MG tablet Take 1 tablet by mouth 3 times daily as needed for Nausea or Vomiting (Patient not taking: Reported on 04/06/2022) 15 tablet 0    Ubrogepant (UBRELVY) 100 MG TABS Take 100 mg by mouth as needed (Migraine) Take 100 mg at onset of headache; can repeat in 2 hours if no change; not to be taken more than 2 days a week. 10 tablet 3    metoprolol succinate (TOPROL XL) 50 MG extended release tablet Take 1 tablet by mouth daily 90 tablet 3    Acetaminophen (TYLENOL ARTHRITIS PAIN PO) Take by mouth      citalopram (CELEXA) 20 MG tablet Take 1 tablet by mouth daily 30 tablet 0    omeprazole (PRILOSEC) 40 MG delayed release capsule Take 1 capsule by mouth daily 30 capsule 0    vitamin B-12 (CYANOCOBALAMIN) 1000 MCG tablet Take 1 tablet by mouth daily      albuterol sulfate HFA (PROVENTIL;VENTOLIN;PROAIR) 108 (  90 Base) MCG/ACT inhaler Inhale 2 puffs into the lungs every 6 hours as needed      aspirin 81 MG chewable tablet Take 1 tablet by mouth daily      cetirizine (ZYRTEC) 10 MG tablet Take 1 tablet by mouth daily      docusate (COLACE, DULCOLAX) 100 MG CAPS Col-Rite 100 mg capsule   take 1 capsule by mouth twice a day if needed for constipation      fluticasone (FLONASE) 50 MCG/ACT nasal spray 1 spray by nasal route daily      furosemide (LASIX) 40 MG tablet Take 1 tablet by mouth daily as needed         Past History     Past Medical History:  Past Medical History:   Diagnosis Date    Anxiety     Bipolar 1 disorder (HCC)     CHF (congestive heart failure) (HCC)     COPD (chronic obstructive pulmonary disease) (HCC)     DDD (degenerative disc disease), cervical     GERD (gastroesophageal reflux disease)     H/O aortic valve replacement     History of transient ischemic attack (TIA)     Mitral regurgitation and mitral stenosis     PTSD (post-traumatic stress disorder)     Scoliosis      Vitamin D deficiency        Past Surgical History:  Past Surgical History:   Procedure Laterality Date    CARDIAC CATHETERIZATION      CARDIAC VALVE SURGERY      mitral valve replacement    CESAREAN SECTION      HAND/FINGER SURGERY UNLISTED Right     I&D right middle finger    HYSTERECTOMY (CERVIX STATUS UNKNOWN)      IR KYPHOPLASTY THORACIC FIRST LEVEL  03/03/2022    IR KYPHOPLASTY THORACIC FIRST LEVEL 03/03/2022 Samoilov, Dmitri E, MD [facility] RAD ANGIO IR    KNEE SURGERY      OVARY REMOVAL         Family History:  Family History   Problem Relation Age of Onset    No Known Problems Mother        Social History:  Social History     Tobacco Use    Smoking status: Every Day     Current packs/day: 0.75     Types: Cigarettes     Passive exposure: Current    Smokeless tobacco: Never   Vaping Use    Vaping Use: Never used   Substance Use Topics    Alcohol use: Never    Drug use: Yes     Types: Marijuana Sherrie Mustache)       Allergies:  Allergies   Allergen Reactions    Lithium Nausea And Vomiting     Vomiting      Alprazolam Other (See Comments)     Gets suicidal    Dermatitis Antigen      Rash and blisers  Rash and blisers    Diazepam Other (See Comments)     Gets suicidal    Levofloxacin Other (See Comments)     Muscle stiffness and bulging muscles     Tobacco Other (See Comments)     Increasing lung deterioration     Adhesive Tape Rash     blisters         Review of Systems     Review of Systems      Physical Exam       Patient  Vitals for the past 12 hrs:   Temp Pulse Resp BP SpO2   05/04/22 1900 -- -- -- (!) 140/70 94 %   05/04/22 1830 -- -- -- -- 94 %   05/04/22 1815 -- -- -- -- 94 %   05/04/22 1800 -- -- -- 136/83 94 %   05/04/22 1730 -- 53 11 -- --   05/04/22 1715 -- 58 14 -- --   05/04/22 1700 -- 64 14 (!) 148/67 --   05/04/22 1645 -- 70 13 -- --   05/04/22 1630 -- 59 18 -- --   05/04/22 1615 -- 68 13 -- --   05/04/22 1600 -- 50 14 -- 93 %   05/04/22 1545 -- 59 14 -- 94 %   05/04/22 1530 -- -- 14 -- 95 %   05/04/22 1515 --  64 22 -- 92 %   05/04/22 1500 -- 51 15 (!) 145/61 93 %   05/04/22 1445 -- 51 16 (!) 123/107 93 %   05/04/22 1430 -- 55 14 (!) 178/135 99 %   05/04/22 1415 -- 62 12 (!) 155/74 --   05/04/22 1348 97 F (36.1 C) 58 11 (!) 170/69 --       IPVITALS  Patient Vitals for the past 24 hrs:   BP Temp Temp src Pulse Resp SpO2 Height Weight   05/04/22 1900 (!) 140/70 -- -- -- -- 94 % -- --   05/04/22 1830 -- -- -- -- -- 94 % -- --   05/04/22 1815 -- -- -- -- -- 94 % -- --   05/04/22 1800 136/83 -- -- -- -- 94 % -- --   05/04/22 1730 -- -- -- 53 11 -- -- --   05/04/22 1715 -- -- -- 58 14 -- -- --   05/04/22 1700 (!) 148/67 -- -- 64 14 -- -- --   05/04/22 1645 -- -- -- 70 13 -- -- --   05/04/22 1630 -- -- -- 59 18 -- -- --   05/04/22 1615 -- -- -- 68 13 -- -- --   05/04/22 1600 -- -- -- 50 14 93 % -- --   05/04/22 1545 -- -- -- 59 14 94 % -- --   05/04/22 1530 -- -- -- -- 14 95 % -- --   05/04/22 1515 -- -- -- 64 22 92 % -- --   05/04/22 1500 (!) 145/61 -- -- 51 15 93 % -- --   05/04/22 1448 -- -- -- -- -- -- 1.6 m (_0 ) 71.4 kg (157 lb 4.8 oz)   05/04/22 1445 (!) 123/107 -- -- 51 16 93 % -- --   05/04/22 1430 (!) 178/135 -- -- 55 14 99 % -- --   05/04/22 1415 (!) 155/74 -- -- 62 12 -- -- --   05/04/22 1348 (!) 170/69 97 F (36.1 C) Axillary 58 11 -- -- --       Physical Exam  Constitutional:       Appearance: Normal appearance.      Comments: Blank stare response to pain   HENT:      Head: Normocephalic and atraumatic.   Eyes:      Comments: Convergent gaze with reactive pupils and intact EOMs   Cardiovascular:      Rate and Rhythm: Normal rate and regular rhythm.      Pulses: Normal pulses.      Heart sounds: Normal heart sounds.   Pulmonary:  Effort: Pulmonary effort is normal.      Breath sounds: Normal breath sounds.   Abdominal:      Tenderness: There is no abdominal tenderness.   Musculoskeletal:         General: Normal range of motion.      Cervical back: Normal range of motion and neck supple.   Skin:     General:  Skin is warm and dry.   Neurological:      General: No focal deficit present.      GCS: GCS eye subscore is 4. GCS verbal subscore is 3. GCS motor subscore is 5.           Diagnostic Study Results   Labs -  Recent Results (from the past 24 hour(s))   CBC with Auto Differential    Collection Time: 05/04/22  1:48 PM   Result Value Ref Range    WBC 9.8 4.6 - 13.2 K/uL    RBC 5.31 (H) 4.20 - 5.30 M/uL    Hemoglobin 16.1 (H) 12.0 - 16.0 g/dL    Hematocrit 50.3 (H) 35.0 - 45.0 %    MCV 94.7 78.0 - 100.0 FL    MCH 30.3 24.0 - 34.0 PG    MCHC 32.0 31.0 - 37.0 g/dL    RDW 13.1 11.6 - 14.5 %    Platelets 204 135 - 420 K/uL    MPV 11.6 9.2 - 11.8 FL    Nucleated RBCs 0.0 0 PER 100 WBC    nRBC 0.00 0.00 - 0.01 K/uL    Neutrophils % 77 (H) 40 - 73 %    Lymphocytes % 19 (L) 21 - 52 %    Monocytes % 3 3 - 10 %    Eosinophils % 0 0 - 5 %    Basophils % 0 0 - 2 %    Immature Granulocytes 0 0.0 - 0.5 %    Neutrophils Absolute 7.5 1.8 - 8.0 K/UL    Lymphocytes Absolute 1.9 0.9 - 3.6 K/UL    Monocytes Absolute 0.3 0.05 - 1.2 K/UL    Eosinophils Absolute 0.0 0.0 - 0.4 K/UL    Basophils Absolute 0.0 0.0 - 0.1 K/UL    Absolute Immature Granulocyte 0.0 0.00 - 0.04 K/UL    Differential Type AUTOMATED     Comprehensive Metabolic Panel    Collection Time: 05/04/22  1:48 PM   Result Value Ref Range    Sodium 144 136 - 145 mmol/L    Potassium 4.7 3.5 - 5.5 mmol/L    Chloride 114 (H) 100 - 111 mmol/L    CO2 24 21 - 32 mmol/L    Anion Gap 6 3.0 - 18 mmol/L    Glucose 110 (H) 74 - 99 mg/dL    BUN 16 7.0 - 18 MG/DL    Creatinine 0.87 0.6 - 1.3 MG/DL    Bun/Cre Ratio 18 12 - 20      Est, Glom Filt Rate >60 >60 ml/min/1.89m    Calcium 10.2 (H) 8.5 - 10.1 MG/DL    Total Bilirubin 0.9 0.2 - 1.0 MG/DL    ALT 22 13 - 56 U/L    AST 28 10 - 38 U/L    Alk Phosphatase 68 45 - 117 U/L    Total Protein 7.5 6.4 - 8.2 g/dL    Albumin 4.2 3.4 - 5.0 g/dL    Globulin 3.3 2.0 - 4.0 g/dL    Albumin/Globulin Ratio 1.3 0.8 - 1.7  Protime-INR    Collection Time:  05/04/22  1:48 PM   Result Value Ref Range    Protime 14.2 11.9 - 14.7 sec    INR 1.1 0.9 - 1.1     Troponin    Collection Time: 05/04/22  1:48 PM   Result Value Ref Range    Troponin, High Sensitivity 6 0 - 54 ng/L   EKG 12 Lead    Collection Time: 05/04/22  1:51 PM   Result Value Ref Range    Ventricular Rate 52 BPM    Atrial Rate 52 BPM    P-R Interval 154 ms    QRS Duration 108 ms    Q-T Interval 474 ms    QTc Calculation (Bazett) 440 ms    P Axis 96 degrees    R Axis 57 degrees    T Axis 50 degrees    Diagnosis       Sinus bradycardia  Minimal voltage criteria for LVH, may be normal variant ( Sokolow-Lyon )  Nonspecific ST and T wave abnormality  Abnormal ECG  When compared with ECG of 05-Dec-2021 13:08,  No significant change was found     POCT Glucose    Collection Time: 05/04/22  2:56 PM   Result Value Ref Range    POC Glucose 95 70 - 110 mg/dL       Radiologic Studies -   CT HEAD WO CONTRAST   Final Result      No acute ischemic change or hemorrhage.  The brain looks normal for age.      Note: These findings were discussed with Archie Patten with readback at 1415 on 04 May 2022.         ===================   Note: [facility] maintains that all CT scans at their facilities   are performed by using dose optimization technique as appropriate to a performed   examination, to include automated exposure control, adjustment of the mAs and/or   kVp according to patient size (including appropriate matching for site specific   examinations) or use of iterative reconstruction technique.         CTA HEAD NECK W CONTRAST    (Results Pending)     _0 @    Medications ordered:   Medications   sodium chloride 0.9 % bolus 1,000 mL (has no administration in time range)   iopamidol (ISOVUE-370) 76 % injection 80 mL (80 mLs IntraVENous Given 05/04/22 1408)         Medical Decision Making   Initial Medical Decision Making and DDx:  Considerations include polypharmacy, recreational drug use,  intraparenchymal hemorrhage, ischemic stroke.    ED Course: Progress Notes, Reevaluation, and Consults:  ED Course as of 05/04/22 1908   Wed May 04, 2022   1358 Discussed with Dr. Ula Lingo teleneurology he will see and evaluate the patient [CB]   1815 Reassessed the patient, regards her surroundings, speaks quietly moves slowly but dramatically improved from her condition initially.    Reviewed Dr. Myriam Jacobson note, feels does not represent acute ischemic or intracranial event.  Considered encephalopathy but vigorous response to brief painful stimulus.  Asked the patient and she is willing to try ambulating and getting out of bed. [CB]      ED Course User Index  [CB] Freddie Apley, MD     7:08 PM EST  Discussed with Dr. Gwyndolyn Saxon strong accepts the admission, seems to be some lightheadedness encephalopathy has resolved.  Urine ordered  I am the first provider for this patient.    I reviewed the vital signs, available nursing notes, past medical history, past surgical history, family history and social history.    Patient Vitals for the past 12 hrs:   Temp Pulse Resp BP SpO2   05/04/22 1900 -- -- -- (!) 140/70 94 %   05/04/22 1830 -- -- -- -- 94 %   05/04/22 1815 -- -- -- -- 94 %   05/04/22 1800 -- -- -- 136/83 94 %   05/04/22 1730 -- 53 11 -- --   05/04/22 1715 -- 58 14 -- --   05/04/22 1700 -- 64 14 (!) 148/67 --   05/04/22 1645 -- 70 13 -- --   05/04/22 1630 -- 59 18 -- --   05/04/22 1615 -- 68 13 -- --   05/04/22 1600 -- 50 14 -- 93 %   05/04/22 1545 -- 59 14 -- 94 %   05/04/22 1530 -- -- 14 -- 95 %   05/04/22 1515 -- 64 22 -- 92 %   05/04/22 1500 -- 51 15 (!) 145/61 93 %   05/04/22 1445 -- 51 16 (!) 123/107 93 %   05/04/22 1430 -- 55 14 (!) 178/135 99 %   05/04/22 1415 -- 62 12 (!) 155/74 --   05/04/22 1348 97 F (36.1 C) 58 11 (!) 170/69 --       Vital Signs-Reviewed the patient's vital signs.    Pulse Oximetry Analysis, Cardiac Monitor, 12 lead ekg:      Interpreted by the EP.      Records Reviewed: Nursing  notes reviewed (Time of Review: 7:08 PM)    Procedures:   Critical Care Time: 0  If critical care time is note it is exclusive of any separately billable procedures.     Aspirin: (was aspirin given for stroke?)    Diagnosis     Disposition: DISPOSITION Admitted 05/04/2022 07:07:52 PM       DISCHARGE MEDICATIONS:  Current Discharge Medication List             Details   pravastatin (PRAVACHOL) 40 MG tablet Take 1 tablet by mouth daily  Qty: 90 tablet, Refills: 2      nitroGLYCERIN (NITROSTAT) 0.4 MG SL tablet Place 1 tablet under the tongue every 5 minutes as needed for Chest pain up to max of 3 total doses. If no relief after 1 dose, call 911.  Qty: 25 tablet, Refills: 3      warfarin (COUMADIN) 2.5 MG tablet Take 1 tablet by mouth daily  Qty: 90 tablet, Refills: 2      TRELEGY ELLIPTA 100-62.5-25 MCG/ACT AEPB inhaler inhale 1 puff by mouth and INTO THE LUNGS once daily      pregabalin (LYRICA) 200 MG capsule Take 1 capsule by mouth 3 times daily for 180 days.  Qty: 90 capsule, Refills: 5    Associated Diagnoses: Chronic left-sided low back pain without sciatica; Other intervertebral disc degeneration, lumbosacral region; Chronic pain syndrome      baclofen (LIORESAL) 10 MG tablet Take 0.5-1 tablets by mouth 3 times daily as needed (pain/spasm)  Qty: 60 tablet, Refills: 0    Associated Diagnoses: Chronic left-sided low back pain without sciatica      ondansetron (ZOFRAN) 4 MG tablet Take 1 tablet by mouth 3 times daily as needed for Nausea or Vomiting  Qty: 15 tablet, Refills: 0      Ubrogepant (UBRELVY) 100 MG TABS Take 100  mg by mouth as needed (Migraine) Take 100 mg at onset of headache; can repeat in 2 hours if no change; not to be taken more than 2 days a week.  Qty: 10 tablet, Refills: 3    Associated Diagnoses: Migraine without aura and without status migrainosus, not intractable      metoprolol succinate (TOPROL XL) 50 MG extended release tablet Take 1 tablet by mouth daily  Qty: 90 tablet, Refills: 3       Acetaminophen (TYLENOL ARTHRITIS PAIN PO) Take by mouth      citalopram (CELEXA) 20 MG tablet Take 1 tablet by mouth daily  Qty: 30 tablet, Refills: 0      omeprazole (PRILOSEC) 40 MG delayed release capsule Take 1 capsule by mouth daily  Qty: 30 capsule, Refills: 0      vitamin B-12 (CYANOCOBALAMIN) 1000 MCG tablet Take 1 tablet by mouth daily      albuterol sulfate HFA (PROVENTIL;VENTOLIN;PROAIR) 108 (90 Base) MCG/ACT inhaler Inhale 2 puffs into the lungs every 6 hours as needed      aspirin 81 MG chewable tablet Take 1 tablet by mouth daily      cetirizine (ZYRTEC) 10 MG tablet Take 1 tablet by mouth daily      docusate (COLACE, DULCOLAX) 100 MG CAPS Col-Rite 100 mg capsule   take 1 capsule by mouth twice a day if needed for constipation      fluticasone (FLONASE) 50 MCG/ACT nasal spray 1 spray by nasal route daily      furosemide (LASIX) 40 MG tablet Take 1 tablet by mouth daily as needed             DISCONTINUED MEDICATIONS:  Current Discharge Medication List          PATIENT REFERRED TO:  Follow Up with:  No follow-up provider specified.  _______________________________    Clinical Impression:   1. Encephalopathy         Doristine Counter, MD using Dragon dictation      _______________________________     Freddie Apley, MD  05/04/22 [phone]

## 2022-05-05 ENCOUNTER — Inpatient Hospital Stay: Admit: 2022-05-05 | Payer: PRIVATE HEALTH INSURANCE | Primary: Family Medicine

## 2022-05-05 LAB — URINALYSIS
Bilirubin Urine: NEGATIVE
Blood, Urine: NEGATIVE
Glucose, UA: NEGATIVE mg/dL
Ketones, Urine: 15 mg/dL — AB
Leukocyte Esterase, Urine: NEGATIVE
Nitrite, Urine: NEGATIVE
Protein, UA: 30 mg/dL — AB
Specific Gravity, UA: >1.03 — ABNORMAL HIGH (ref 1.003–1.030)
Urobilinogen, Urine: 0.2 EU/dL (ref 0.2–1.0)
pH, Urine: 6 (ref 5.0–8.0)

## 2022-05-05 LAB — CBC WITH AUTO DIFFERENTIAL
Basophils %: 1 % (ref 0–2)
Basophils Absolute: 0 10*3/uL (ref 0.0–0.1)
Eosinophils %: 1 % (ref 0–5)
Eosinophils Absolute: 0 10*3/uL (ref 0.0–0.4)
Hematocrit: 38.9 % (ref 35.0–45.0)
Hemoglobin: 12.7 g/dL (ref 12.0–16.0)
Immature Granulocytes %: 0 % (ref 0.0–0.5)
Immature Granulocytes Absolute: 0 10*3/uL (ref 0.00–0.04)
Lymphocytes %: 32 % (ref 21–52)
Lymphocytes Absolute: 2.1 10*3/uL (ref 0.9–3.6)
MCH: 30.3 PG (ref 24.0–34.0)
MCHC: 32.6 g/dL (ref 31.0–37.0)
MCV: 92.8 FL (ref 78.0–100.0)
MPV: 10.5 FL (ref 9.2–11.8)
Monocytes %: 6 % (ref 3–10)
Monocytes Absolute: 0.4 10*3/uL (ref 0.05–1.2)
Neutrophils %: 60 % (ref 40–73)
Neutrophils Absolute: 3.9 10*3/uL (ref 1.8–8.0)
Nucleated RBCs: 0 PER 100 WBC
Platelets: 205 10*3/uL (ref 135–420)
RBC: 4.19 M/uL — ABNORMAL LOW (ref 4.20–5.30)
RDW: 13 % (ref 11.6–14.5)
WBC: 6.5 10*3/uL (ref 4.6–13.2)
nRBC: 0 10*3/uL (ref 0.00–0.01)

## 2022-05-05 LAB — BASIC METABOLIC PANEL
Anion Gap: 5 mmol/L (ref 3.0–18)
BUN/Creatinine Ratio: 17 (ref 12–20)
BUN: 15 MG/DL (ref 7.0–18)
CO2: 26 mmol/L (ref 21–32)
Calcium: 9.3 MG/DL (ref 8.5–10.1)
Chloride: 116 mmol/L — ABNORMAL HIGH (ref 100–111)
Creatinine: 0.87 MG/DL (ref 0.6–1.3)
Est, Glom Filt Rate: >60 mL/min/{1.73_m2} (ref >60)
Glucose: 95 mg/dL (ref 74–99)
Potassium: 3.4 mmol/L — ABNORMAL LOW (ref 3.5–5.5)
Sodium: 147 mmol/L — ABNORMAL HIGH (ref 136–145)

## 2022-05-05 LAB — TROPONIN: Troponin, High Sensitivity: 9 ng/L (ref 0–54)

## 2022-05-05 LAB — EKG 12-LEAD
Atrial Rate: 52 {beats}/min
Atrial Rate: 54 {beats}/min
P Axis: 96 degrees
P Axis: 87 degrees
P-R Interval: 154 ms
P-R Interval: 146 ms
Q-T Interval: 474 ms
Q-T Interval: 468 ms
QRS Duration: 108 ms
QRS Duration: 92 ms
QTc Calculation (Bazett): 440 ms
QTc Calculation (Bazett): 443 ms
R Axis: 57 degrees
R Axis: 37 degrees
T Axis: 50 degrees
T Axis: 42 degrees
Ventricular Rate: 52 {beats}/min
Ventricular Rate: 54 {beats}/min

## 2022-05-05 LAB — URINE DRUG SCREEN
Amphetamine, Urine: NEGATIVE
Barbiturates, Urine: NEGATIVE
Benzodiazepines, Urine: NEGATIVE
Cocaine, Urine: NEGATIVE
Methadone, Urine: NEGATIVE
Opiates, Urine: NEGATIVE
Phencyclidine, Urine: NEGATIVE
THC, TH-Cannabinol, Urine: POSITIVE — AB

## 2022-05-05 LAB — URINALYSIS, MICRO
BACTERIA, URINE: NEGATIVE /hpf
WBC, UA: 0 /hpf (ref 0–4)

## 2022-05-05 LAB — POCT GLUCOSE: POC Glucose: 103 mg/dL (ref 70–110)

## 2022-05-05 LAB — PROTIME-INR
INR: 1.2 — ABNORMAL HIGH (ref 0.9–1.1)
Protime: 15.6 s — ABNORMAL HIGH (ref 11.9–14.7)

## 2022-05-05 LAB — MAGNESIUM: Magnesium: 2 mg/dL (ref 1.6–2.6)

## 2022-05-05 LAB — AMMONIA: Ammonia: 11 umol/L (ref 11–32)

## 2022-05-05 MED ORDER — CITALOPRAM HYDROBROMIDE 20 MG PO TABS
20 MG | Freq: Every day | ORAL | Status: DC
Start: 2022-05-05 — End: 2022-05-08
  Administered 2022-05-06 – 2022-05-08 (×3): 20 mg via ORAL

## 2022-05-05 MED ORDER — ALBUTEROL SULFATE (2.5 MG/3ML) 0.083% IN NEBU
Freq: Four times a day (QID) | RESPIRATORY_TRACT | Status: AC | PRN
Start: 2022-05-05 — End: 2022-05-08

## 2022-05-05 MED ORDER — POTASSIUM CHLORIDE CRYS ER 20 MEQ PO TBCR
20 MEQ | Freq: Once | ORAL | Status: AC
Start: 2022-05-05 — End: 2022-05-06

## 2022-05-05 MED ORDER — FUROSEMIDE 40 MG PO TABS
40 MG | Freq: Every day | ORAL | Status: DC
Start: 2022-05-05 — End: 2022-05-08
  Administered 2022-05-06 – 2022-05-08 (×3): 40 mg via ORAL

## 2022-05-05 MED ORDER — SODIUM CHLORIDE 0.9 % IV SOLN
0.9 % | INTRAVENOUS | Status: DC | PRN
Start: 2022-05-05 — End: 2022-05-08

## 2022-05-05 MED ORDER — WARFARIN SODIUM 7.5 MG PO TABS
7.5 MG | Freq: Once | ORAL | Status: AC
Start: 2022-05-05 — End: 2022-05-05
  Administered 2022-05-05: 22:00:00 7.5 mg via ORAL

## 2022-05-05 MED ORDER — NICOTINE 14 MG/24HR TD PT24
14 MG/24HR | Freq: Every day | TRANSDERMAL | Status: DC
Start: 2022-05-05 — End: 2022-05-08
  Administered 2022-05-05 – 2022-05-08 (×4): 1 via TRANSDERMAL

## 2022-05-05 MED ORDER — ASPIRIN 81 MG PO CHEW
81 MG | Freq: Every day | ORAL | Status: AC
Start: 2022-05-05 — End: 2022-05-08
  Administered 2022-05-06 – 2022-05-08 (×3): 81 mg via ORAL

## 2022-05-05 MED ORDER — NORMAL SALINE FLUSH 0.9 % IV SOLN
0.9 % | INTRAVENOUS | Status: DC | PRN
Start: 2022-05-05 — End: 2022-05-08

## 2022-05-05 MED ORDER — ACETAMINOPHEN 325 MG PO TABS
325 MG | Freq: Four times a day (QID) | ORAL | Status: DC | PRN
Start: 2022-05-05 — End: 2022-05-08
  Administered 2022-05-06 – 2022-05-07 (×2): 650 mg via ORAL

## 2022-05-05 MED ORDER — LAMOTRIGINE 25 MG PO TABS
25 MG | Freq: Every evening | ORAL | Status: DC
Start: 2022-05-05 — End: 2022-05-04

## 2022-05-05 MED ORDER — PREGABALIN 75 MG PO CAPS
75 MG | Freq: Three times a day (TID) | ORAL | Status: DC
Start: 2022-05-05 — End: 2022-05-08
  Administered 2022-05-05 – 2022-05-08 (×8): 200 mg via ORAL
  Administered 2022-05-08: 14:00:00 150 mg via ORAL
  Administered 2022-05-08: 14:00:00 50 mg via ORAL

## 2022-05-05 MED ORDER — WARFARIN DAILY DOSING BY PHARMACIST (PLACEHOLDER)
Status: AC
Start: 2022-05-05 — End: 2022-05-08

## 2022-05-05 MED ORDER — LAMOTRIGINE 25 MG PO TABS
25 MG | Freq: Three times a day (TID) | ORAL | Status: DC
Start: 2022-05-05 — End: 2022-05-08
  Administered 2022-05-05 – 2022-05-08 (×9): 25 mg via ORAL

## 2022-05-05 MED ORDER — POLYETHYLENE GLYCOL 3350 17 G PO PACK
17 g | Freq: Every day | ORAL | Status: DC | PRN
Start: 2022-05-05 — End: 2022-05-08

## 2022-05-05 MED ORDER — NORMAL SALINE FLUSH 0.9 % IV SOLN
0.9 % | Freq: Two times a day (BID) | INTRAVENOUS | Status: DC
Start: 2022-05-05 — End: 2022-05-08
  Administered 2022-05-05 – 2022-05-08 (×8): 10 mL via INTRAVENOUS

## 2022-05-05 MED ORDER — ACETAMINOPHEN 650 MG RE SUPP
650 | Freq: Four times a day (QID) | RECTAL | Status: DC | PRN
Start: 2022-05-05 — End: 2022-05-08

## 2022-05-05 MED ORDER — ENOXAPARIN SODIUM 40 MG/0.4ML IJ SOSY
400.4 MG/0.4ML | Freq: Every day | INTRAMUSCULAR | Status: AC
Start: 2022-05-05 — End: 2022-05-08
  Administered 2022-05-05 – 2022-05-08 (×4): 40 mg via SUBCUTANEOUS

## 2022-05-05 MED ORDER — BARIUM SULFATE 96 % PO SUSR
96 % | Freq: Once | ORAL | Status: AC | PRN
Start: 2022-05-05 — End: 2022-05-05
  Administered 2022-05-05: 19:00:00 45 mL via ORAL

## 2022-05-05 MED ORDER — WARFARIN SODIUM 5 MG PO TABS
5 MG | Freq: Once | ORAL | Status: AC
Start: 2022-05-05 — End: 2022-05-05

## 2022-05-05 MED ORDER — METOPROLOL SUCCINATE ER 25 MG PO TB24
25 MG | Freq: Every day | ORAL | Status: AC
Start: 2022-05-05 — End: 2022-05-08
  Administered 2022-05-06 – 2022-05-08 (×3): 50 mg via ORAL

## 2022-05-05 MED ORDER — LORAZEPAM 1 MG PO TABS
1 MG | Freq: Once | ORAL | Status: AC
Start: 2022-05-05 — End: 2022-05-05
  Administered 2022-05-06: 01:00:00 1 mg via ORAL

## 2022-05-05 MED ORDER — PRAVASTATIN SODIUM 20 MG PO TABS
20 MG | Freq: Every evening | ORAL | Status: AC
Start: 2022-05-05 — End: 2022-05-08
  Administered 2022-05-06 – 2022-05-08 (×3): 40 mg via ORAL

## 2022-05-05 MED ORDER — ONDANSETRON HCL 4 MG/2ML IJ SOLN
4 MG/2ML | Freq: Four times a day (QID) | INTRAMUSCULAR | Status: DC | PRN
Start: 2022-05-05 — End: 2022-05-08
  Administered 2022-05-05 – 2022-05-06 (×3): 4 mg via INTRAVENOUS

## 2022-05-05 MED ORDER — ARFORMOTEROL TARTRATE 15 MCG/2ML IN NEBU
15 MCG/2ML | Freq: Two times a day (BID) | RESPIRATORY_TRACT | Status: DC
Start: 2022-05-05 — End: 2022-05-08
  Administered 2022-05-05 – 2022-05-08 (×6): 15 ug via RESPIRATORY_TRACT

## 2022-05-05 MED ORDER — ONDANSETRON 4 MG PO TBDP
4 MG | Freq: Three times a day (TID) | ORAL | Status: DC | PRN
Start: 2022-05-05 — End: 2022-05-08

## 2022-05-05 MED FILL — ARFORMOTEROL TARTRATE 15 MCG/2ML IN NEBU: 15 MCG/2ML | RESPIRATORY_TRACT | Qty: 2

## 2022-05-05 MED FILL — LAMOTRIGINE 25 MG PO TABS: 25 MG | ORAL | Qty: 1

## 2022-05-05 MED FILL — ONDANSETRON HCL 4 MG/2ML IJ SOLN: 4 MG/2ML | INTRAMUSCULAR | Qty: 2

## 2022-05-05 MED FILL — E-Z-PAQUE 96 % PO SUSR: 96 % | ORAL | Qty: 45

## 2022-05-05 MED FILL — LOVENOX 40 MG/0.4ML IJ SOSY: 40 MG/0.4ML | INTRAMUSCULAR | Qty: 0.4

## 2022-05-05 MED FILL — COUMADIN 7.5 MG PO TABS: 7.5 MG | ORAL | Qty: 1

## 2022-05-05 MED FILL — BD POSIFLUSH 0.9 % IV SOLN: 0.9 % | INTRAVENOUS | Qty: 40

## 2022-05-05 MED FILL — NICOTINE 14 MG/24HR TD PT24: 14 MG/24HR | TRANSDERMAL | Qty: 1

## 2022-05-05 MED FILL — PREGABALIN 50 MG PO CAPS: 50 MG | ORAL | Qty: 1

## 2022-05-05 NOTE — Plan of Care (Signed)
Problem: SLP Adult - Impaired Swallowing  Goal: By Discharge: Advance to least restrictive diet without signs or symptoms of aspiration for planned discharge setting.  See evaluation for individualized goals.  Description: Patient will:  1. Tolerate PO trials with 0 s/s overt distress in 4/5 trials  2. Utilize compensatory swallow strategies/maneuvers (decrease bite/sip, size/rate, alt. liq/sol) with min cues in 4/5 trials  3. Perform oral-motor/laryngeal exercises to increase oropharyngeal swallow function with min cues  4. Complete an objective swallow study (i.e., MBSS) to assess swallow integrity, r/o aspiration, and determine of safest LRD, min A    Recommend:   NPO until completion of MBS  Strict aspiration precautions (HOB >30 degrees at all times, Oral care TID)  Meds in puree    Outcome: [place] EVALUATION/TREATMENT    Patient: Mary [name] (56 y.o. female)  Date: 05/05/2022  Primary Diagnosis: Encephalopathy [G93.40]  Mitral valve replaced [Z95.2]  Acute encephalopathy [G93.40]       Precautions: Aspiration  PLOF: As per H&P  ASSESSMENT:  Based on the objective data described below, the patient presents with min pharyngeal dysphagia (needs further diagnostic assessment). Pt demo immediate strong cough and emesis event following thin liquid trials. She also reported odynophagia with all PO which began after current admission. Laryngeal elevation was functional/timely to palpation. Recommend NPO until completion of MBS, aspiration precautions, and oral care TID. Rec meds in puree as able.     TREATMENT:  Skilled therapy initiated; Educated patient on aspiration risk, need for diagnostic testing to r/o airway compromise, and role of SLP; verbalized comprehension. Will continue to follow.    Patient will benefit from skilled intervention to address the above impairments.  Patient's rehabilitation potential good .  Factors which may influence rehabilitation  potential include:   []             None noted  []             Mental ability/status  [x]             Medical condition  []             Home/family situation and support systems  []             Safety awareness  []             Pain tolerance/management  []             Other:      PLAN :  Recommendations and Planned Interventions:  Recommendations  Requires SLP Intervention: Yes  Recommendations: Modified barium swallow study  D/C Recommendations: To be determined  Diet Solids Recommendation: NPO  Liquid Consistency Recommendation: NPO  Compensatory Swallowing Strategies : Upright as possible for all oral intake;Remain upright for 30-45 minutes after meals  Recommended Form of Meds: Crushed in puree as able  Therapeutic Interventions: Diet tolerance monitoring;Patient/Family education  Patient Education: Pt edu re: safe swallowing techniques/strategies, aspiration precautions, diet recommendations, and role of SLP.  Patient Education Response: Verbalizes understanding;Needs reinforcement    Frequency/Duration: Patient will be followed by speech-language pathology 1-2 times per day/3-5 days per week to address goals.    Discharge Recommendations: D/C Recommendations: To be determined     SUBJECTIVE:   Patient stated, "Thank you!"    OBJECTIVE:     Past Medical History:   Diagnosis Date    Anxiety     Bipolar 1 disorder ([place])     CHF (congestive heart failure) ([place])  COPD (chronic obstructive pulmonary disease) (HCC)     DDD (degenerative disc disease), cervical     GERD (gastroesophageal reflux disease)     H/O aortic valve replacement     History of transient ischemic attack (TIA)     Mitral regurgitation and mitral stenosis     PTSD (post-traumatic stress disorder)     Scoliosis     Vitamin D deficiency      Past Surgical History:   Procedure Laterality Date    CARDIAC CATHETERIZATION      CARDIAC VALVE SURGERY      mitral valve replacement    CESAREAN SECTION      HAND/FINGER SURGERY UNLISTED Right     I&D right middle  finger    HYSTERECTOMY (CERVIX STATUS UNKNOWN)      IR KYPHOPLASTY THORACIC FIRST LEVEL  03/03/2022    IR KYPHOPLASTY THORACIC FIRST LEVEL 03/03/2022 Samoilov, Dmitri E, MD [facility] RAD ANGIO IR    KNEE SURGERY      OVARY REMOVAL       Prior Level of Function/Home Situation: no previous speech/swallowing deficits       Daily Assessment:  Baseline Assessment  Temperature Spikes Noted: No  Respiratory Status: Room air  O2 Device: None (Room air)  Communication Observation: Functional  Follows Directions: Simple  Current Diet : NPO  Current Liquid Diet : NPO  Dentition: Adequate  Patient Positioning: Upright in bed  Baseline Vocal Quality: Normal  Volitional Cough: Strong    Orientation:  Person, Place, and Situation    Oral Assessment:  Oral Motor   Labial: No impairment  Dentition: Natural, Intact  Oral Hygiene: Moist, Clean  Lingual: No impairment  Velum: No Impairment  Mandible: No impairment        P.O. Trials:  PO Trials  Neuromuscular Estim Used: No  Assessment Method(s): Observation, Palpation  Patient Position: 56 at [facility]  Vocal Quality: No Impairment  Consistency Presented: Thin  How Presented: Self-fed/presented, Straw  Bolus Acceptance: No impairment  Bolus Formation/Control: No impairment  Propulsion: No impairment  Oral Residue: None  Initiation of Swallow: No impairment  Laryngeal Elevation: Functional  Aspiration Signs/Symptoms: Strong cough (emesis with PO)  Pharyngeal Phase Characteristics: No impairment, issues, or problems  Cues for Modifications: Minimal          DYSPHAGIA DIAGNOSIS: Dysphagia Diagnosis: Suspected needs further assessment, Mild pharyngeal stage dysphagia    PAIN:  Start of Eval: 0  End of Eval: 0     After treatment:   []             Patient left in no apparent distress sitting up in chair  [x]             Patient left in no apparent distress in bed  [x]             Call bell left within reach  [x]             Nursing notified  []             Family present  []             Caregiver  present  []             Bed alarm activated    COMMUNICATION/EDUCATION:   [x]             Aspiration precautions; swallow safety; compensatory techniques provided via demonstration, verbalization and teach back of comprehension  [x]          Patient/family have  participated as able in goal setting and plan of care.  []            Patient/family agree to work toward stated goals and plan of care.  []            Patient understands intent and goals of therapy, neutral about participation.  []            Patient unable to participate in goal setting/plan of care secondary to cognition, hearing/vision deficits; education ongoing with interdisciplinary staff   []            Handout regarding diet recommendations and thickener instructions provided.  [x]         Posted safety precautions in patient's room.    Thank you for this referral.    Eddie North, M.S., CCC-SLP/L  Speech-Language Pathologist

## 2022-05-05 NOTE — Progress Notes (Signed)
MRI screening form needs to be filled out and faxed to [phone] BEFORE MRI can be scheduled.  If unable to obtain information from patient , [facility] needs to be contacted . If patient is claustrophobic or will needs pain meds, please have ordered in advance in order to facilitate exam.

## 2022-05-05 NOTE — Consults (Signed)
A 56 years old female patient with medical history of anxiety/bipolar disorder, PTSD, CHF, COPD, cervical spine disease, aortic valve replacement on Coumadin, TIA was brought to the emergency room for an altered mental status.  Patient is a poor historian and unable to give detailed history.  Her caretaker found the patient unresponsive and called EMS.  Patient was not talking and was staring per EMS.  No witnessed abnormal body movement.  Was moving her extremities symmetrically.  When I asked her today, patient does not remember the episode.  She states that she has right lower extremity weakness and numbness related to her neck surgery; also complains of numbness over the left upper extremity.  Has difficulty lifting both lower extremities on physical examination.  CT scan of the brain did not show any acute changes.  CT angiography of the neck unremarkable.  CTA of the brain did not show LVO.  CMP unremarkable.  Normal ammonia.  UDS was positive for THC.    Social History     Socioeconomic History    Marital status: Divorced     Spouse name: Not on file    Number of children: Not on file    Years of education: Not on file    Highest education level: Not on file   Occupational History    Not on file   Tobacco Use    Smoking status: Every Day     Current packs/day: 0.75     Types: Cigarettes     Passive exposure: Current    Smokeless tobacco: Never   Vaping Use    Vaping Use: Never used   Substance and Sexual Activity    Alcohol use: Never    Drug use: Yes     Types: Marijuana Sherrie Mustache)    Sexual activity: Not on file   Other Topics Concern    Not on file   Social History Narrative    Not on file     Social Determinants of Health     Financial Resource Strain: Not on file   Food Insecurity: Not on file   Transportation Needs: Not on file   Physical Activity: Not on file   Stress: Not on file   Social Connections: Not on file   Intimate Partner Violence: Not on file   Housing Stability: Not on file       Family  History   Problem Relation Age of Onset    No Known Problems Mother         Current Facility-Administered Medications   Medication Dose Route Frequency Provider Last Rate Last Admin    potassium chloride (KLOR-CON M) extended release tablet 40 mEq  40 mEq Oral Once Strong, Malachy Chamber, MD        warfarin (COUMADIN) tablet 7.5 mg  7.5 mg Oral Once Strong, Malachy Chamber, MD        sodium chloride flush 0.9 % injection 5-40 mL  5-40 mL IntraVENous 2 times per day Natale Milch, MD   10 mL at 05/05/22 1042    sodium chloride flush 0.9 % injection 5-40 mL  5-40 mL IntraVENous PRN Strong, Malachy Chamber, MD        0.9 % sodium chloride infusion   IntraVENous PRN Strong, Malachy Chamber, MD        enoxaparin (LOVENOX) injection 40 mg  40 mg SubCUTAneous Daily Natale Milch, MD   40 mg at 05/04/22 2049    ondansetron (ZOFRAN-ODT) disintegrating tablet 4 mg  4  mg Oral Q8H PRN Strong, Chrissie Noa, MD        Or    ondansetron [facility]) injection 4 mg  4 mg IntraVENous Q6H PRN Strong, Chrissie Noa, MD   4 mg at 05/05/22 1145    polyethylene glycol (GLYCOLAX) packet 17 g  17 g Oral Daily PRN Strong, Chrissie Noa, MD        acetaminophen (TYLENOL) tablet 650 mg  650 mg Oral Q6H PRN Strong, Chrissie Noa, MD        Or    acetaminophen (TYLENOL) suppository 650 mg  650 mg Rectal Q6H PRN Strong, Chrissie Noa, MD        citalopram (CELEXA) tablet 20 mg  20 mg Oral Daily Strong, Chrissie Noa, MD        arformoterol tartrate (BROVANA) nebulizer solution 15 mcg  15 mcg Nebulization BID RT Benjie Karvonen, MD   15 mcg at 05/04/22 2053    albuterol (PROVENTIL) (2.5 MG/3ML) 0.083% nebulizer solution 2.5 mg  2.5 mg Nebulization Q6H PRN Strong, Chrissie Noa, MD        pregabalin (LYRICA) capsule 200 mg  200 mg Oral TID Strong, Chrissie Noa, MD        furosemide (LASIX) tablet 40 mg  40 mg Oral Daily Strong, Chrissie Noa, MD        nicotine (NICODERM CQ) 14 MG/24HR 1 patch  1 patch TransDERmal Daily Strong, Chrissie Noa, MD   1 patch at 05/04/22 2048    metoprolol succinate  (TOPROL XL) extended release tablet 50 mg  50 mg Oral Daily Strong, Chrissie Noa, MD        lamoTRIgine (LAMICTAL) tablet 25 mg  25 mg Oral TID Benjie Karvonen, MD        aspirin chewable tablet 81 mg  81 mg Oral Daily Strong, Chrissie Noa, MD        pravastatin (PRAVACHOL) tablet 40 mg  40 mg Oral Nightly Strong, Chrissie Noa, MD        warfarin placeholder: dosing by pharmacy   Other RX Placeholder Benjie Karvonen, MD           Past Medical History:   Diagnosis Date    Anxiety     Bipolar 1 disorder (HCC)     CHF (congestive heart failure) (HCC)     COPD (chronic obstructive pulmonary disease) (HCC)     DDD (degenerative disc disease), cervical     GERD (gastroesophageal reflux disease)     H/O aortic valve replacement     History of transient ischemic attack (TIA)     Mitral regurgitation and mitral stenosis     PTSD (post-traumatic stress disorder)     Scoliosis     Vitamin D deficiency        Past Surgical History:   Procedure Laterality Date    CARDIAC CATHETERIZATION      CARDIAC VALVE SURGERY      mitral valve replacement    CESAREAN SECTION      HAND/FINGER SURGERY UNLISTED Right     I&D right middle finger    HYSTERECTOMY (CERVIX STATUS UNKNOWN)      IR KYPHOPLASTY THORACIC FIRST LEVEL  03/03/2022    IR KYPHOPLASTY THORACIC FIRST LEVEL 03/03/2022 Samoilov, Dmitri E, MD [facility] RAD ANGIO IR    KNEE SURGERY      OVARY REMOVAL         Allergies   Allergen Reactions    Lithium Nausea And Vomiting  Vomiting      Alprazolam Other (See Comments)     Gets suicidal    Dermatitis Antigen      Rash and blisers  Rash and blisers    Diazepam Other (See Comments)     Gets suicidal    Levofloxacin Other (See Comments)     Muscle stiffness and bulging muscles     Tobacco Other (See Comments)     Increasing lung deterioration     Adhesive Tape Rash     blisters       Patient Active Problem List   Diagnosis    Sensorineural hearing loss (SNHL) of both ears    Onychomycosis    Rib pain on left side    DDD (degenerative disc  disease), lumbosacral    Dyslipidemia    Nicotine dependence    Severe obesity with body mass index (BMI) of 35.0 to 39.9 with serious comorbidity (HCC)    Asthma    Antral gastritis    Osteopenia    Hyperlipidemia    Diabetes mellitus (HCC)    COVID-19    Abrasion of right middle finger with infection    Chest congestion    Elevated INR    Moderate to severe pulmonary hypertension (HCC)    Onycholysis    Nodule of right lung    Respiratory failure (HCC)    Diastolic CHF, chronic (HCC)    Allergic rhinitis    Facial neuropathy, traumatic, left, sequela    History of mitral valve replacement with bioprosthetic valve    Severe obesity (BMI >= 40) (HCC)    Panlobular emphysema (HCC)    Anxiety disorder    Bipolar affective disorder, depressed in partial remission (HCC)    Suppurative tenosynovitis of flexor tendon of right hand    Chronic obstructive pulmonary disease (HCC)    Bipolar I disorder with depression, severe (HCC)    Heart valve pulmonary stenosis    TIA (transient ischemic attack)    Cellulitis and abscess of hand    Lumbar compression fracture (HCC)    Afib (HCC)    Abscess of finger    Mitral regurgitation    Chronic pain syndrome    Allergy    Mitral valve replaced    Chronic bilateral thoracic back pain    Thoracic compression fracture, sequela    Sepsis (HCC)    Bacteremia    Septic shock (HCC)    Compression fracture of T6 vertebra (HCC)    Hx of kyphoplasty, T8    Localized osteoporosis with current pathological fracture    Encephalopathy    Acute encephalopathy         Review of Systems:   Constitutional no fever  Skin: Reddish lesion over the right upper extremity.  HENT no changes in hearing.  Eyes no changes in her vision.  Respiratory: Complains of difficulty breathing  Cardiovascular: Complains of chest pain.    Gastrointestinal: Abdominal pain; no diarrhea.   Genitourinary denies incontinence  Musculoskeletal: Has pain over the neck/upper back; lower extremities.  Hematology: No  bleeding  Neurological as above in HPI      PHYSICAL EXAMINATION:      VITAL SIGNS:  BP 137/68   Pulse 65   Temp 98.5 F (36.9 C) (Oral)   Resp 18   Ht 1.6 m (5\' 3" )   Wt 71.4 kg (157 lb 4.8 oz)   SpO2 94%   BMI 27.86 kg/m     GENERAL: In no apparent distress.  EXTREMITIES: Muscle tone is normal.  HEAD:   The patient is normocephalic.    NEUROLOGIC EXAMINATION  Mental status: Awake, alert, oriented to self/age; oriented to the month and year but not to the date/day; knows the president; follows commands.  No gaze deviation.  No asterixis.  No scheduled seizure-like activity.  Speech and languge: fluent, coherent,and comprehension intact  CN: VFF, EOMI, PERRLA, face sensation intact , no facial asymmetry noted, palate elevation symmetric bilat, SS+SCM 5/5 bilat, tongue midline  Motor: Unable to lift both upper extremities because of pain over her neck/upper back; symmetric upper extremity strength; difficulty lifting her lower extremities; poor effort.    Sensory: Decreased light touch over the right side.  Coordination: FNF accurate w/o dysmetria  Gait: not tested     Narrative & Impression  EXAM: CTA BRAIN/NECK  CPT CODE: 70496/70498     CLINICAL INDICATION/HISTORY: .     COMPARISON: .     TECHNIQUE: Helical CT scan of the brain/neck was performed at 1.25 mm intervals  during rapid IV bolus contrast administration.  These data were reconstructed at  0.625 mm intervals for vascular analysis.  The data was also reviewed at 2.5 mm  intervals for accompanying soft tissue analysis.  Following this, image  reconstruction performed separately for the brain and neck on an independent  workstation with 3-D, maximum intensity projection (MIP) and shaded-volumetric  rendering of the major vessels was reviewed.  Contrast: Isovue-370 - 100 mL IV;  uneventful administration.     FINDINGS:      CTA BRAIN SOFT TISSUE ANALYSIS: Ventricular system, basilar cisterns and sulci  are appropriate for patient's stated age.  No  areas of mass effect, edema,  intra-/extra-axial hemorrhage or abnormal calcifications noted.  Calvarium and  skull base are unremarkable.  Extracranial bony and soft tissues are  unremarkable.     CTA BRAIN VASCULAR ANALYSIS: Anterior circulation anatomy is balanced.   Cisternal vessels are widely patent.  Runoff vessels appear normal.  The  anterior communicating artery is patent.       The posterior fossa, the right vertebral artery is markedly dominant.  The small  nondominant left vertebral artery terminates in the left PICA.  There is no  visible vessel distal to the left PICA origin.  Other major branches of the  vertebrobasilar system are patent except for an absent P1 segment of the left  PCA.  The P2 portion is supplied from the left ICA via a prominent posterior  communicating artery segment.  The right PCoA is not well seen.  PCA runoff is  otherwise patent, bilaterally.       No suggestions of focal stenotic disease to suggest prevalent atherosclerotic  change or larger vessel vasculitis.     CTA NECK SOFT TISSUE ANALYSIS: The mucosal surfaces of the aerodigestive tract  are unremarkable.  The salivary glands are normal.  The thyroid gland is  unremarkable.  Images through the root of the neck, extending to the superior  mediastinum, and lung apices, are clear.  There is no adenopathy seen.  Normal  vascular enhancement is identified.  There is been anterior cervical discectomy  and fusion at C3-4.  There has been anterior corpectomy and fusion.-C6 strut.   There is no significant canal stenosis.  No other osseous abnormalities are  seen.     CTA NECK VASCULAR ANALYSIS: The great vessels have a normal arrangement from the  aorta with widely patent ostia.  The common carotid arteries, cervical common  carotid bifurcations, and cervical internal carotid arteries are widely patent,  bilaterally.  The vertebral arteries are widely patent with normal subclavian  origins.  The right vertebral artery is  markedly dominant.  NASCET criteria were  used.     IMPRESSION:     CTA Brain:  There is no larger vessel occlusion or significant focal stenosis.   There is a normal variant persistent fetal origin of the left PCA from the left  ICA.  Right vertebral artery is markedly dominant and small left vertebral  artery terminates in PICA.  Intracranial circulation is patent.       No significant parenchymal abnormalities seen.     CTA Neck:  Unremarkable CTA of the neck.  The right vertebral artery is  dominant.       No soft tissue abnormalities identified.    Narrative & Impression  EXAM: CT BRAIN  CPT code: 35009       CLINICAL INDICATION/HISTORY: Patient found unresponsive by caregiver.  Patient  is flaccid, vomiting and unresponsive.     COMPARISON: None.     TECHNIQUE: Contiguous noncontrast axial images of the brain are obtained from  the foramen magnum to the vertex and reviewed in brain and bone windows.     FINDINGS: There are no focal parenchymal defects and there is no evidence of  mass, mass effect or intracranial hemorrhage.  The cerebrospinal fluid spaces  are normal for age.  No significant extra-axial abnormalities are seen.  The  pituitary fossa is unremarkable.  The mastoids and visualized paranasal sinuses  are clear.  The visualized orbits look grossly normal.  No significant bony  abnormalities are seen.     IMPRESSION:     No acute ischemic change or hemorrhage.  The brain looks normal for age.         Impression:   A 56 years old female patient with above medical problems including aortic valve replacement on Coumadin was brought to the emergency room for altered mental status.  Was found by her caretaker unresponsive.  Subsequently, she was noticed to be staring per EMS.  She was not having any witnessed abnormal body movement.  Moving her extremities symmetrically.  She states that she has right lower extremity weakness and left upper extremity numbness; changes sides on repeated questioning.   Partial disorientation.  No asterixis.  CT scan of the brain unremarkable.  CT angiography of the head and neck did not show LVO.  No past history of seizure.  UDS positive for THC.  CMP unremarkable.  She has no fever or leukocytosis.  Differentials include metabolic encephalopathy, postictal state, functional neurologic disorder.    Plan:   -Neurochecks and vital signs every 4 hours  -MRI of the brain without contrast  -EEG  -Follow-up transthoracic echo  -Follow metabolic panels and correct any changes  -Continue with Coumadin; if MRI shows AIS, need to hold it  -We will follow patient  -Call for questions    PLEASE NOTE:   This document has been produced using voice recognition software. Unrecognized errors in transcription may be present.

## 2022-05-05 NOTE — Care Coordination-Inpatient (Signed)
Case Management Assessment  Initial Evaluation    Date/Time of Evaluation: 05/05/2022 3:37 PM  Assessment Completed by: Isolde Skaff    If patient is discharged prior to next notation, then this note serves as note for discharge by case management.    Patient Name: Mary [name]                   Date of Birth: 06/25/1965  Diagnosis: Encephalopathy [G93.40]  Mitral valve replaced [Z95.2]  Acute encephalopathy [G93.40]                   Date / Time: 05/04/2022  1:47 PM    Patient Admission Status: Inpatient   Readmission Risk (Low < 19, Mod (19-27), High > 27): Readmission Risk Score: 9.8    Current PCP: Vincenza Hews, MD  PCP verified by CM? Yes    Chart Reviewed: Yes      History Provided by: Patient  Patient Orientation: Alert and Oriented    Patient Cognition: Alert    Hospitalization in the last 30 days (Readmission):  No    If yes, Readmission Assessment in CM Navigator will be completed.    Advance Directives:      Code Status: Full Code   Patient's Primary Decision Maker is:        Discharge Planning:    Patient lives with: Other (Comment) (Lives in a residential boarding house) Type of Home: Other (Comment) (Residential boarding house)  Primary Care Giver: Self  Patient Support Systems include: Other (Comment) ([employer]- Mary [name], Presenter, broadcasting)   Current Financial resources: Medicaid  Current community resources: Other (Comment) (Residential boarding house)  Current services prior to admission: [place], Other (Comment) (Lives in a residential boarding house)            Current DME: Kasandra Knudsen (Patient denies the use of oxygen prior to admission. would like to get a walker)            Type of Home Care services:       ADLS  Prior functional level: Independent in ADLs/IADLs (Patient stated independent with ADLs)  Current functional level: Independent in ADLs/IADLs    PT AM-PAC:   /24  OT AM-PAC:   /24    Family can provide assistance at DC: Other (comment) (Patient reports having no family  support besides her Mary [name], however states that she currently lives in a residential boarding house.)  Would you like Case Management to discuss the discharge plan with any other family members/significant others, and if so, who? No  Plans to Return to Present Housing: Yes (Patient stated to her knowledge she can go back to the residential boarding house)  Other Identified Issues/Barriers to RETURNING to current housing: Medically not cleared  Potential Assistance needed at discharge: [place] DME: Walker  Patient expects to discharge to: Other (comment) (Residential boarding house)  Plan for transportation at discharge: (P) [place]: [place] / Plan: [facility] / Product Type: *No Product type* /     Does insurance require precert for SNF: Yes    Potential assistance Purchasing Medications:    Meds-to-Beds request:        [facility] [phone] [place], [place] [phone] Wanda Plump [phone]  [place] [id number]  Phone:  [phone] Fax: [phone]    [place], [place] - [address] [place] [phone] Wanda Plump [phone]  [address] [place] [address]  Phone: [phone] Fax: [phone]      Notes:    Factors facilitating achievement of predicted outcomes: Cooperative    Barriers to discharge: No family support and Medical complications    Additional Case Management Notes: CM met with patient at bedside. Patient reports she currently lives in a residential boarding house. Patient states for discharge she will need to get a medicaid cab. Patient reports needing a walker.     The Plan for Transition of Care is related to the following treatment goals of Encephalopathy [G93.40]  Mitral valve replaced [Z95.2]  Acute encephalopathy [C14.48]    IF APPLICABLE: The Patient and/or patient representative  Mary [name] and her family were provided with a choice of provider and agrees with the discharge plan. Freedom of choice list with basic dialogue that supports the patient's individualized plan of care/goals and shares the quality data associated with the providers was provided to: (P) Patient   Patient Representative Name:       The Patient and/or Patient Representative Agree with the Discharge Plan? (P) Yes       05/05/22 1420   Service Assessment   Patient Orientation Alert and Oriented   Cognition Alert   History Provided By Patient   Primary Caregiver Mary [name] Other (Comment)  ([place], [place])   PCP Verified by CM Yes   Prior Functional Level Independent in ADLs/IADLs  (Patient stated independent with ADLs)   Current Functional Level Independent in ADLs/IADLs   Can patient return to prior living arrangement Yes  (Patient stated to her knowledge she can go back to the residential boarding house)   Ability to make needs known: Good   Family able to assist with home care needs: Other (comment)  (Patient reports having no family support besides her Mary [name], however states that she currently lives in a residential boarding house.)   Would you like for me to discuss the discharge plan with any other family members/significant others, and if so, who? No   Financial Resources [employer] Other (Comment)  (Residential boarding house)   Social/Functional History   Lives With Other (comment)  (Patient reports living in a residential boarding house)   Type of [place]  (Residential boarding house)   Home Layout One level   Home Access Level entry   [place] unit   Educational psychologist None  (Patient reported not having any of the following)   Primary school teacher  (patient reports having a cane, but would like to get a walker)   Receives Help From Other (comment)  ([employer] Scientist, forensic,  however lives in a residential boarding [place]  (Patient reports overall she does everything for herself without help)   [place] assistance  (Lives in a residential boarding house)   Ambulation Assistance Independent   Secretary/administrator No   Discharge Planning   Type of Residence Other (Comment)  (Residential boarding house)   Living Arrangements Other (Comment)  (Lives in a residential boarding house)   Current Services Prior To Admission Museum/gallery conservator;Other (Comment)  (Lives in a residential boarding house)   Current DME Prior to [employer]  (  Patient denies the use of oxygen prior to admission. would like to get a walker)   Potential Assistance Needed Durable Medical Equipment   Potential DME Needed Walker   Patient expects to be discharged to: Other (comment)  (Residential boarding house)   One/Two Story Residence Other (comment)  (lives on the first floor)   [place] Discharge   Mode of Transport at Discharge Other (see comment)  (Patient reports will need a medicaid cab for transportation after discharge)   Confirm Follow Up Transport Cab   Condition of Participation: Discharge Planning   The Patient and/or Patient Representative was provided with a Choice of Provider? Patient   The Patient and/Or Patient Representative agree with the Discharge Plan? Yes   Freedom of Choice list was provided with basic dialogue that supports the patient's individualized plan of care/goals, treatment preferences, and shares the quality data associated with the providers?  Yes  (If recommended patient reports being okay with [facility].)         Dahlia Bailiff, RN  Case Management Department

## 2022-05-05 NOTE — H&P (Addendum)
History and Physical    Patient: Mary [name] MRN: [id number]  SSN: [id number]    Date of Birth: May 28, 1965  Age: 56 y.o.  Sex: female      Subjective:      Mary [name] is a 56 y.o. female who past medical history of anxiety, bipolar 1 disorder, HFpEF, COPD, mitral valve replacement, PTSD.     Patient alert and oriented x3, but poor historian, poor recollection of events. Per report, patient was found down around 8 pm by caregiver, patient was unresponsive on the cough and contacted EMS. Upon arrival to ER, patient had blank stare but moving all extremities.     Stroke workup negative in ER. Tele neurology consulted. Unable to view notes but reportedly low suspicion for stroke. CT head negative. CTA head and neck pending radiology report.     Currently patient knows where she is and the date. Does not recall events leading up to her coming to hospital via EMS. Able to recall most of her medications. Says she has not been taking her lamotrigine for bipolar for the last 10 days or so. Otherwise has been taking her medications as prescribed. Says she takes coumadin alternating days 7.5mg , 10mg  .  INR 1.1     Has some reproducible chest pain left sided. No shortness of breath, no palpitations, no dizziness, no HA, nausea, vomit, fevers or chills. No focal deficits.     Per nursing patient has been pulling off her blood pressure cuff and tele lines.       ER Course:   Code stroke called for encephalopathy    Vital signs showed elevated blood pressure 170/60, HR 58, 99% room air, afebrile. RR 11.   CBC, CMP unremarkable.   INR 1.1  UA unremarkable  UDS positive for THC.     CT head unremarkable  CTA head and neck pending.     CT Head 05/04/2022:  IMPRESSION:     No acute ischemic change or hemorrhage.  The brain looks normal for age.     Past Medical History:   Diagnosis Date    Anxiety     Bipolar 1 disorder (HCC)     CHF (congestive heart failure) (HCC)     COPD (chronic obstructive pulmonary disease) (HCC)     DDD  (degenerative disc disease), cervical     GERD (gastroesophageal reflux disease)     H/O aortic valve replacement     History of transient ischemic attack (TIA)     Mitral regurgitation and mitral stenosis     PTSD (post-traumatic stress disorder)     Scoliosis     Vitamin D deficiency      Past Surgical History:   Procedure Laterality Date    CARDIAC CATHETERIZATION      CARDIAC VALVE SURGERY      mitral valve replacement    CESAREAN SECTION      HAND/FINGER SURGERY UNLISTED Right     I&D right middle finger    HYSTERECTOMY (CERVIX STATUS UNKNOWN)      IR KYPHOPLASTY THORACIC FIRST LEVEL  03/03/2022    IR KYPHOPLASTY THORACIC FIRST LEVEL 03/03/2022 Samoilov, Dmitri E[facility]    KNEE SURGERY      OVARY REMOVAL        Family History   Problem Relation Age of Onset    No Known Problems Mother      Social History     Tobacco Use    Smoking status: Every  Day     Current packs/day: 0.75     Types: Cigarettes     Passive exposure: Current    Smokeless tobacco: Never   Substance Use Topics    Alcohol use: Never      Prior to Admission medications    Medication Sig Start Date End Date Taking? Authorizing Provider   pravastatin (PRAVACHOL) 40 MG tablet Take 1 tablet by mouth [name] 04/06/22   Sandre Kitty, MD   nitroGLYCERIN (NITROSTAT) 0.4 MG SL tablet Place 1 tablet under the tongue every 5 minutes as needed for Chest pain up to max of 3 total doses. If no relief after 1 dose, call 911. 04/06/22   Sandre Kitty, MD   warfarin (COUMADIN) 2.5 MG tablet Take 1 tablet by mouth [name] 04/06/22   Sandre Kitty, MD   TRELEGY ELLIPTA 100-62.5-25 MCG/ACT AEPB inhaler inhale 1 puff by mouth and INTO THE LUNGS once [name] 03/17/22   [provider]   pregabalin (LYRICA) 200 MG capsule Take 1 capsule by mouth 3 times [name] for 180 days. 03/24/22 09/20/22  [name], MD   baclofen (LIORESAL) 10 MG tablet Take 0.5-1 tablets by mouth 3 times [name] as needed (pain/spasm) 03/24/22   [name], MD    ondansetron (ZOFRAN) 4 MG tablet Take 1 tablet by mouth 3 times [name] as needed for Nausea or Vomiting  Patient not taking: Reported on 04/06/2022 01/16/22   [name], MD   Ubrogepant (UBRELVY) 100 MG TABS Take 100 mg by mouth as needed (Migraine) Take 100 mg at onset of headache; can repeat in 2 hours if no change; not to be taken more than 2 days a week. 12/10/21   Danton Clap D, MD   metoprolol succinate (TOPROL XL) 50 MG extended release tablet Take 1 tablet by mouth [name] 12/03/21   Black, Talmage Nap, APRN - NP   Acetaminophen (TYLENOL ARTHRITIS PAIN PO) Take by mouth    [provider]   citalopram (CELEXA) 20 MG tablet Take 1 tablet by mouth [name] 08/27/21   Reineke-Piper, Roslyn Smiling, MD   omeprazole (PRILOSEC) 40 MG delayed release capsule Take 1 capsule by mouth [name] 08/27/21   Reineke-Piper, Roslyn Smiling, MD   vitamin B-12 (CYANOCOBALAMIN) 1000 MCG tablet Take 1 tablet by mouth [name]    [provider]   albuterol sulfate HFA (PROVENTIL;VENTOLIN;PROAIR) 108 (90 Base) MCG/ACT inhaler Inhale 2 puffs into the lungs every 6 hours as needed 11/19/19   Automatic Reconciliation, Ar   aspirin 81 MG chewable tablet Take 1 tablet by mouth [name]    Automatic Reconciliation, Ar   cetirizine (ZYRTEC) 10 MG tablet Take 1 tablet by mouth [name] 10/23/19   Automatic Reconciliation, Ar   docusate (COLACE, DULCOLAX) 100 MG CAPS Col-Rite 100 mg capsule   take 1 capsule by mouth twice a day if needed for constipation    Automatic Reconciliation, Ar   fluticasone (FLONASE) 50 MCG/ACT nasal spray 1 spray by nasal route [name] 05/09/20   Automatic Reconciliation, Ar   furosemide (LASIX) 40 MG tablet Take 1 tablet by mouth [name] as needed    Automatic Reconciliation, Ar        Allergies   Allergen Reactions    Lithium Nausea And Vomiting     Vomiting      Alprazolam Other (See Comments)     Gets suicidal    Dermatitis Antigen      Rash and blisers  Rash and blisers  Diazepam Other (See Comments)     Gets suicidal     Levofloxacin Other (See Comments)     Muscle stiffness and bulging muscles     Tobacco Other (See Comments)     Increasing lung deterioration     Adhesive Tape Rash     blisters        Review of Systems:  A comprehensive review of systems was negative except for that written in the History of Present Illness.  CONSTITUTIONAL: Denies weight loss, fever, chills, weakness or fatigue.  HEENT:  Denies acute vision changes, No hearing loss or tinnitus, no nasal congestion or throat pain.  SKIN: No rash or itching.  CARDIOVASCULAR: Denies chest pain, chest pressure or chest discomfort. No palpitations or edema.  RESPIRATORY: denies shortness of breath, cough or sputum.  GASTROINTESTINAL:  denies pain, distension, nausea, diarrhea  GENITOURINARY: denies dysuria, bleeding and incontinence.  MUSCULOSKELETAL: chest wall pain  NEUROLOGICAL: denies focal deficits  PSYCHIATRIC: Denies changes in mentation, normal mood.       Objective:     Vitals:    05/05/22 0200 05/05/22 0430 05/05/22 0600 05/05/22 0700   BP: (!) 149/66 (!) 140/64 (!) 141/66 (!) 135/59   Pulse:       Resp:  19  17   Temp:  97.9 F (36.6 C)     TempSrc:  Oral     SpO2: 95% 95% 92% 94%   Weight:       Height:            Physical Exam:  General: Well appearing, NAD  Skin: No rash, no lesions  HEENT: Normal appearing oropharynx; no LAD, no lesions; EOMI  Head: Normocephalic, atraumatic  Cardiovascular: regular rate and rhythm, no obvious murmurs noted, no edema  Pulmonary: CTA bilaterally, no obvious wheezing, rales, or rhonchi  Abdomen: Bowel sounds normal, soft, non distended, non tender, no rebound or guarding.   MSK:  No deformities, spine non tender; reproducible left chest wall tenderness.   Neuro: CN 2-12 intact, no focal deficits.   Psychiatric: normal mood, appropriate behavior, Alert and oriented x 3    Assessment:           Plan:   Acute Encephalopathy  Unclear etiology. Was code stroke in ER. CT head negative  CTA head and neck pending  Possible  toxic etiology, acute psychotic episode, hx of bipolar 1. Missed 10 days of lamictal.   UA negative, urine cultures pending  UDS positive for [facility]  Tele neuro consulted in ER. Patietn became more responsive to painful stimuli.   This morning, symptomatically improved. No focal deficits  Consult Neurology  Check lamictal levels. Resume home meds  Continue aspirin, statin  PT, OT  Speech eval/swallow evaluation; passed bedside swallow per nursing    Hx of Bioprosthetic mitral valve replacement / hx of Left atrial appendage ligation 2017 / pAFIB / HFpEF / HTN  Goal INR 2.0-3.0.   Primary Cardiologist Dr. Glade Nurse  Echo  Lasix 40mg  PO [name]  Metoprolol succinate 50mg  [name]  Aspirin, statin  Warfarin dosing per pharmacy.   [name] INR.       COPD:  Duonebs PRN  brovana    Tobacco Use   1/2 PPD  Nicotine patch    HLD  Pravastatin    Bipolar/Anxiety/PTSD  Lamictal 25mg  q8 hours  Celexa 20mg  [name]    Marijuana Use:  THC positive UDS. Acknowledgues use. Denies other substance abuse    GI PPX: lovenox  DVT ppx:  H2 antagonist    Signed By: Natale Milch, MD     May 05, 2022    Time for admission more than 75 minutes included review previous record,hospital record ,test result previous discharge summery , order entry,discussion with patient and exam. Discussion with nursing staff and documentation

## 2022-05-05 NOTE — Progress Notes (Signed)
Bedside shift report given to Haja Crego M. Cully Luckow RN from Melissa RN. Report including the following information SBAR, Kardex, recent results, MAR, intake/output and cardiac rhythm NSR.

## 2022-05-05 NOTE — ED Notes (Signed)
Attempted to page hospitalist  x3 to discuss pt failed swallow screen and not taking any night time PO meds.

## 2022-05-05 NOTE — Progress Notes (Signed)
Chaplain completed the initial Spiritual Assessment of the patient, and offered Pastoral Care support to the patient in bed 3 of the emergency room where she will be admitted to the hospital  due to encephalopathy,There is no advance directive present.Patient does not have any religious/cultural needs that will affect patient's preferences in health care. Chaplains will continue to follow and will provide pastoral care on an as needed/requested basis.    Gerlean Ren  Spiritual Care Department  [phone]

## 2022-05-05 NOTE — Progress Notes (Signed)
[facility] Dosing Services    Consult for Warfarin Dosing by Pharmacy by Dr. Jacqulyn Liner, Gwyndolyn Saxon  Consult provided for this 56 y.o.  female , for indication of Heart Valve Replacement (Mitral valve - bioprosthetic)    Dose to achieve an INR goal of 2.0 to 3.0 (confirmed with Dr. Jacqulyn Liner)  Home dose: 10 mg (2.5 mg x 4) every Tue, Sat; 7.5 mg (2.5 mg x 3) all other days     Warfarin  7.5 (mg) to be given today at 1800.     DATE INR DOSE   12/20 1.1 7.5 mg       Significant drug interactions: None  PT/INR Lab Results   Component Value Date/Time    INR 1.2 05/05/2022 04:32 AM    INR 2.2 04/07/2020 09:20 AM    INREXT 2.0 05/27/2021 12:00 AM       Platelets Lab Results   Component Value Date/Time    PLT 205 05/05/2022 04:32 AM      H/H Lab Results   Component Value Date/Time    HGB 12.7 05/05/2022 04:32 AM        Pharmacy to follow daily and will provide subsequent Warfarin dosing based on clinical status.     [place], [facility]

## 2022-05-05 NOTE — ED Notes (Signed)
Attempted to call reportsx1

## 2022-05-05 NOTE — ED Notes (Signed)
Report given to dayshift RN.

## 2022-05-05 NOTE — ED Notes (Signed)
Reports given to Melissa, RN

## 2022-05-05 NOTE — Plan of Care (Signed)
Problem: Occupational Therapy - Adult  Goal: By Discharge: Performs self-care activities at highest level of function for planned discharge setting.  See evaluation for individualized goals.  Description: Occupational Therapy Goals:  Initiated 05/05/2022 to be met within 7-10 days.    1.  Patient will perform grooming with modified independence.   2.  Patient will perform bathing with modified independence.  3.  Patient will perform upper body dressing and lower body dressing  with modified independence.  4.  Patient will perform toilet transfers with modified independence.  5.  Patient will perform all aspects of toileting with modified independence using RW with good safety awareness.  6.  Patient will participate in upper extremity therapeutic exercise/activities with modified independence for 8-10 minutes to increase strength/endurance for ADLs.    7.  Patient will utilize energy conservation techniques during functional activities with min verbal cues.    PLOF: independent with ADLs and functional mobility      Outcome: Progressing   OCCUPATIONAL THERAPY EVALUATION    Patient: Mary [name] (56 y.o. female)  Date: 05/05/2022  Primary Diagnosis: Encephalopathy [G93.40]  Mitral valve replaced [Z95.2]  Acute encephalopathy [G93.40]       Precautions: General Precautions, Fall Risk, NPO,  ,  ,  ,  ,  ,  ,      ASSESSMENT :  Bed mobility: cga supine <-> sit edge of bed. LB dress: cga doff and don slipper socks with good balance using cross leg technique. Bedside commode transfer: pt appears fearful of falling and appears agitated at OTR, in which nursing provided assist, STS w/ CGA and stand step using RW to bedside with CGA, toilet tasks w/ CGA. Pt laying semi-reclined in bed at end of tx session, call bell within reach & pt verbalized understanding to utilize for assist e.g. functional transfers in order to prevent falls.       DEFICITS/IMPAIRMENTS:  Performance deficits / Impairments: Decreased functional  mobility ;Decreased endurance;Decreased ADL status;Decreased balance;Decreased strength;Decreased safe awareness;Decreased cognition    Patient will benefit from skilled intervention to address the above impairments.  Patient's rehabilitation potential/Prognosis: Good.  Factors which may influence rehabilitation potential include:   _0              None noted  _1              Mental ability/status  _2              Medical condition  _3              Home/family situation and support systems  _4              Safety awareness  _5              Pain tolerance/management  _6              Other:      PLAN :  Recommendations and Planned Interventions:   _7                Self Care Training                  _8       Therapeutic Activities  _9                Functional Mobility Training   _10       Cognitive Retraining  _11                Therapeutic Exercises           [  x]      Endurance Activities  _0                Balance Training                    _1       Neuromuscular Re-Education  _2                Visual/Perceptual Training     _3       Home Safety Training  _4                Patient Education                   _5       Family Training/Education  _6                Other (comment):    Frequency/Duration: Patient will be followed by occupational therapy to address goals, 1-2 times per day/3-5 days per week to address goals.    Further Equipment Recommendations for Discharge: bedside commode, shower chair, and walker    AMPAC: AM-PAC Inpatient Daily Activity Raw Score: 18    Current research shows that an AM-PAC score of 18 or greater is associated with a discharge to the patient's home setting.  Based on an AM-PAC score and their current ADL deficits; it is recommended that the patient have 2-3 sessions per week of Occupational Therapy at d/c to increase the patient's independence.        This AMPAC score should be considered in conjunction with interdisciplinary team recommendations to determine the most appropriate discharge  setting. Patient's social support, diagnosis, medical stability, and prior level of function should also be taken into consideration.     SUBJECTIVE:   Patient stated, "I live in a group home."    OBJECTIVE DATA SUMMARY:     Past Medical History:   Diagnosis Date    Anxiety     Bipolar 1 disorder (HCC)     CHF (congestive heart failure) (HCC)     COPD (chronic obstructive pulmonary disease) (HCC)     DDD (degenerative disc disease), cervical     GERD (gastroesophageal reflux disease)     H/O aortic valve replacement     History of transient ischemic attack (TIA)     Mitral regurgitation and mitral stenosis     PTSD (post-traumatic stress disorder)     Scoliosis     Vitamin D deficiency      Past Surgical History:   Procedure Laterality Date    CARDIAC CATHETERIZATION      CARDIAC VALVE SURGERY      mitral valve replacement    CESAREAN SECTION      HAND/FINGER SURGERY UNLISTED Right     I&D right middle finger    HYSTERECTOMY (CERVIX STATUS UNKNOWN)      IR KYPHOPLASTY THORACIC FIRST LEVEL  03/03/2022    IR KYPHOPLASTY THORACIC FIRST LEVEL 03/03/2022 Samoilov, Dmitri E, MD [facility] RAD ANGIO IR    KNEE SURGERY      OVARY REMOVAL         Home Situation:   Social/Functional History  Lives With: Other (comment) (group home)  Type of Home:  (group home)  Home Layout: One level  Home Access: Level entry  Bathroom Shower/Tub: Administrator, Civil Service: Standard  Bathroom Equipment: None  Bathroom Accessibility: Accessible  Home Equipment: Reita May Help From: Other (comment) (Godson Zac Honeycutt, however lives in a residential boarding  house.)  ADL Assistance: Independent  Homemaking Assistance: Needs assistance (Lives in a residential boarding house)  Ambulation Assistance: Independent  Transfer Assistance: Independent  Active Driver: No  <[id number]>_3<\/[id number]>_2   Right hand dominant   _1   Left hand dominant    Cognitive/Behavioral Status:  Orientation  Orientation Level: Oriented to person;Disoriented to place;Disoriented to  time;Disoriented to situation  Cognition  Overall Cognitive Status: Exceptions  Arousal/Alertness: Inconsistent responses to stimuli  Following Commands: Follows one step commands with increased time  Attention Span: Difficulty dividing attention;Difficulty attending to directions  Memory: Decreased recall of recent events  Safety Judgement: Decreased awareness of need for assistance;Decreased awareness of need for safety  Problem Solving: Assistance required to generate solutions;Assistance required to implement solutions;Assistance required to correct errors made;Decreased awareness of errors  Insights: Decreased awareness of deficits  Initiation: Requires cues for some  Sequencing: Requires cues for some    Skin: appears intact  Edema: none noted    Vision/Perceptual:    Vision  Vision: Within Functional Limits        Coordination: BUE  Coordination: Within functional limits        Balance: sit Fair, stand Fair           Strength: BUE  Strength: Within functional limits    Tone & Sensation: BUE  Tone: Normal       Range of Motion: BUE  AROM: Within functional limits         Functional Mobility and Transfers for ADLs:  Bed Mobility:     Bed Mobility Training  Bed Mobility Training: Yes  Supine to Sit: Contact-guard assistance  Sit to Supine: Contact-guard assistance  Scooting: Contact-guard assistance  Transfers:                 Pharmacologist: Yes  Sit to Stand: Contact-guard assistance  Stand to Sit: Contact-guard assistance  Toilet Transfer: Contact-guard assistance (bedside commode using RW)    ADL Assessment:   Feeding: NPO  Grooming: Stand by assistance  UE Bathing: Stand by assistance  LE Bathing: Contact guard assistance  UE Dressing: Stand by assistance  LE Dressing: Contact guard assistance  Toileting: Stand by assistance  Functional Mobility: Contact guard assistance  Functional Mobility Skilled Clinical Factors: bedside commode using RW       Pain:  Pain level pre-treatment: 0/10    Pain level post-treatment: 0/10       Activity Tolerance:   Activity Tolerance: Treatment limited secondary to decreased cognition, Treatment limited secondary to agitation  Please refer to the flowsheet for vital signs taken during this treatment.    After treatment:   _2  Patient left in no apparent distress sitting up in chair  _3  Patient left in no apparent distress in bed  _4  Call bell left within reach  _5  Nursing notified  _6  Caregiver present  _7  Bed alarm activated    COMMUNICATION/EDUCATION:   Patient Education  Education Given To: Patient  Education Provided: Role of Therapy;Plan of Care;ADL Market researcher;Fall Prevention Strategies;Equipment;Orientation  Education Method: Verbal;Teach Back  Barriers to Learning: Cognition  Education Outcome: Continued education needed    Thank you for this referral.  Ezequiel Essex, OT  Minutes: 20    Eval Complexity: Decision Making: Medium Complexity

## 2022-05-05 NOTE — ED Notes (Signed)
Reports given by Ander Purpura, RN. Pt is alert and orientedx43, pt denies any pain at this time. Pt hooked up to cardiac monitor, placed bed at its lowest position. Safety precautions implemented,call bell within reach

## 2022-05-05 NOTE — ED Notes (Signed)
Pt C/O stabbing left sided chest pain and nausea. EKG completed. Troponin collected. Mary [name]

## 2022-05-05 NOTE — Plan of Care (Signed)
Problem: SLP Adult - Impaired Swallowing  Goal: By Discharge: Advance to least restrictive diet without signs or symptoms of aspiration for planned discharge setting.  See evaluation for individualized goals.  Description: Patient will:  1. Tolerate PO trials with 0 s/s overt distress in 4/5 trials- met  2. Utilize compensatory swallow strategies/maneuvers (decrease bite/sip, size/rate, alt. liq/sol) with min cues in 4/5 trials- met  3. Perform oral-motor/laryngeal exercises to increase oropharyngeal swallow function with min cues- n/a  4. Complete an objective swallow study (i.e., MBSS) to assess swallow integrity, r/o aspiration, and determine of safest LRD, min A- met 05/05/22    Rec:     Clear liquids secondary to nausea/vomiting with PO this date  Reg diet with thin liquids when cleared by MD  Aspiration precautions  HOB >45 during po intake, remain >30 for 30-45 minutes after po   Small bites/sips; alternate liquid/solid with slow feeding rate   Oral care TID  Meds per pt preference  GI consult to further assess esophageal swallow fxn    05/05/2022 1542 by Eppie Gibson, SLP  Outcome: Completed   SPEECH PATHOLOGY MODIFIED BARIUM SWALLOW STUDY & TREATMENT/DISCHARGE    Patient: Mary [name] (56 y.o. female)  Date: 05/05/2022  Primary Diagnosis: Encephalopathy [G93.40]  Mitral valve replaced [Z95.2]  Acute encephalopathy [G93.40]       Precautions: Aspiration    ASSESSMENT :  Based on the objective data described below, the patient presents with oropharyngeal swallow fxn essentially WFL. Pt tolerated thin liquid trials +/- straw without aspiration/penetration events. Bolus manipulation, tongue base retraction, laryngeal elevation/excursion, and pharyngeal motility was [facility]. Positive oropharyngeal clearance observed. Pt with immediate emesis event post all PO (also evident in ED evaluation). Recommend clear liquid diet secondary to n/v, aspiration precautions, oral care TID, and meds as tolerated. Rec GI  consult to further assess esophageal swallow fxn. Pt okay for reg solid upgrade per MD discretion.    TREATMENT :  Treatment provided post diagnostic testing including oropharyngeal anatomy/physiology, MBS results, diet recommendations and compensatory strategies/positioning.  Pt able to verbalize understanding.  No further skilled SLP services are indicated at this time as oropharyngeal swallow fxn appears to be at baseline and [facility]. Please re-consult if needed.     PLAN :  Recommendations and Planned Interventions:  Recommendations/Treat  Requires SLP Intervention: No  Referral To: GI  Recommendations: GI Eval  D/C Recommendations: No follow up therapy recommended post discharge  Liquid consistency: Clear;Thin  Liquid administration via: Straw;Cup  Medication administration: PO  Supervision: Independent  Compensatory Swallowing Strategies : Upright as possible for all oral intake;Remain upright for 30-45 minutes after meals;Small bites/sips;Alternate solids and liquids  Therapeutic Interventions: Diet tolerance monitoring;Patient/Family education  Patient Education: Pt edu re: results of MBS, safe swallowing techniques/strategies, aspiration precautions, diet recommendations, and role of SLP.  Patient Education Response: Verbalizes understanding  Frequency/Duration: x MBS/tx only     SUBJECTIVE:   Patient stated "I don't know why everything hurts to swallow."    OBJECTIVE:     Past Medical History:   Diagnosis Date    Anxiety     Bipolar 1 disorder (HCC)     CHF (congestive heart failure) (HCC)     COPD (chronic obstructive pulmonary disease) (HCC)     DDD (degenerative disc disease), cervical     GERD (gastroesophageal reflux disease)     H/O aortic valve replacement     History of transient ischemic attack (TIA)     Mitral regurgitation and  mitral stenosis     PTSD (post-traumatic stress disorder)     Scoliosis     Vitamin D deficiency      Past Surgical History:   Procedure Laterality Date    CARDIAC  CATHETERIZATION      CARDIAC VALVE SURGERY      mitral valve replacement    CESAREAN SECTION      HAND/FINGER SURGERY UNLISTED Right     I&D right middle finger    HYSTERECTOMY (CERVIX STATUS UNKNOWN)      IR KYPHOPLASTY THORACIC FIRST LEVEL  03/03/2022    IR KYPHOPLASTY THORACIC FIRST LEVEL 03/03/2022 Samoilov, Eyvonne Left, MD [facility] RAD ANGIO IR    KNEE SURGERY      OVARY REMOVAL       Prior Level of Function/Home Situation:   Social/Functional History  Lives With: Other (comment) (Patient reports living in a residential boarding house)  Type of Home: House (Residential boarding house)  Home Layout: One level  Home Access: Level entry  Bathroom Shower/Tub: Administrator, Civil Service: Soil scientist: None (Patient reported not having any of the following)  Bathroom Accessibility: Accessible  Home Equipment: Kasandra Knudsen (patient reports having a cane, but would like to get a walker)  Receives Help From: Other (comment) (Godson Scientist, forensic, however lives in a residential boarding house.)  ADL Assistance: Independent (Patient reports overall she does everything for herself without help)  Homemaking Assistance: Needs assistance (Lives in a residential boarding house)  Ambulation Assistance: Independent  Transfer Assistance: Independent  Active Driver: No  Diet prior to admission: reg solid with thin  Current Diet:  clear liquids - okay for reg solids when cleared by MD     Orientation:Person and Place    Type of Study: Modified barium swallow  Patient Position: 90 in fluoro chair    Trial 1:   Consistencies Administered: Thin - cup;Thin - straw   How Presented: Self-fed/presented;Straw;Cup/sip   Bolus Acceptance: No impairment   Bolus Formation/Control: No impairment     Propulsion: No impairment   Oral Residue: None       Timing: No impairment   Penetration: None   Aspiration/Timing: No evidence of aspiration   Pharyngeal Clearance: No residue       Cues: None   Swallowing Physiology  Decreased Tongue Base  Retraction?: No  Laryngeal Elevation: WFL (within functional limits)  Penetration-Aspiration Scale (PAS): 1 - No material entered the airway  Pharyngeal Symmetry: Not assessed  Pharyngeal-Esophageal Segment: No impairment  Pharyngeal Dysfunction: None  Findings  Oral Phase Severity: No impairment  Pharyngeal Phase Severity: N/A     8-point Penetration-Aspiration Scale: Score 1    PAIN:  Pt reports 0/10 pain or discomfort prior to MBS.   Pt reports 0/10 pain or discomfort post MBS.     COMMUNICATION/EDUCATION:   [x]  Post diagnostic testing education including oropharyngeal anatomy/physiology, MBS results, diet recommendations and compensatory strategies/positioning provided via demonstration, verbalization and teach back of comprehension   [x]  Video feedback utilized   []  Handout regarding diet recommendations and thickener instructions provided.  [x]  Patient/family have participated as able in goal setting and plan of care  []  Patient/family agree to work toward stated goals and plan of care  []  Patient understands intent and goals of therapy but is neutral about his/her participation  []  Patient unable to participate in goal setting/plan of care secondary to cognition, hearing/vision deficits; education ongoing with interdisciplinary staff  Thank you for this referral.    Eddie North, M.S., CCC-SLP/L  Speech-Language Pathologist

## 2022-05-06 ENCOUNTER — Inpatient Hospital Stay: Admit: 2022-05-06 | Discharge: 2022-05-11 | Payer: PRIVATE HEALTH INSURANCE | Primary: Family Medicine

## 2022-05-06 ENCOUNTER — Inpatient Hospital Stay: Admit: 2022-05-06 | Payer: PRIVATE HEALTH INSURANCE | Primary: Family Medicine

## 2022-05-06 LAB — CBC WITH AUTO DIFFERENTIAL
Basophils %: 1 % (ref 0–2)
Basophils Absolute: 0.1 10*3/uL (ref 0.0–0.1)
Eosinophils %: 1 % (ref 0–5)
Eosinophils Absolute: 0.1 10*3/uL (ref 0.0–0.4)
Hematocrit: 40 % (ref 35.0–45.0)
Hemoglobin: 12.8 g/dL (ref 12.0–16.0)
Immature Granulocytes %: 0 % (ref 0.0–0.5)
Immature Granulocytes Absolute: 0 10*3/uL (ref 0.00–0.04)
Lymphocytes %: 38 % (ref 21–52)
Lymphocytes Absolute: 3 10*3/uL (ref 0.9–3.6)
MCH: 29.8 PG (ref 24.0–34.0)
MCHC: 32 g/dL (ref 31.0–37.0)
MCV: 93 FL (ref 78.0–100.0)
MPV: 11.3 FL (ref 9.2–11.8)
Monocytes %: 6 % (ref 3–10)
Monocytes Absolute: 0.5 10*3/uL (ref 0.05–1.2)
Neutrophils %: 55 % (ref 40–73)
Neutrophils Absolute: 4.4 10*3/uL (ref 1.8–8.0)
Nucleated RBCs: 0 PER 100 WBC
Platelets: 210 10*3/uL (ref 135–420)
RBC: 4.3 M/uL (ref 4.20–5.30)
RDW: 13.2 % (ref 11.6–14.5)
WBC: 8 10*3/uL (ref 4.6–13.2)
nRBC: 0 10*3/uL (ref 0.00–0.01)

## 2022-05-06 LAB — BASIC METABOLIC PANEL
Anion Gap: 5 mmol/L (ref 3.0–18)
BUN/Creatinine Ratio: 16 (ref 12–20)
BUN: 15 MG/DL (ref 7.0–18)
CO2: 26 mmol/L (ref 21–32)
Calcium: 9.3 MG/DL (ref 8.5–10.1)
Chloride: 113 mmol/L — ABNORMAL HIGH (ref 100–111)
Creatinine: 0.92 MG/DL (ref 0.6–1.3)
Est, Glom Filt Rate: >60 mL/min/{1.73_m2} (ref >60)
Glucose: 76 mg/dL (ref 74–99)
Potassium: 3.2 mmol/L — ABNORMAL LOW (ref 3.5–5.5)
Sodium: 144 mmol/L (ref 136–145)

## 2022-05-06 LAB — POCT GLUCOSE
POC Glucose: 89 mg/dL (ref 70–110)
POC Glucose: 89 mg/dL (ref 70–110)
POC Glucose: 106 mg/dL (ref 70–110)

## 2022-05-06 LAB — CULTURE, URINE: Culture: NO GROWTH

## 2022-05-06 LAB — MAGNESIUM: Magnesium: 2.2 mg/dL (ref 1.6–2.6)

## 2022-05-06 LAB — PROTIME-INR
INR: 1.3 — ABNORMAL HIGH (ref 0.9–1.1)
Protime: 16.2 s — ABNORMAL HIGH (ref 11.9–14.7)

## 2022-05-06 MED ORDER — FAMOTIDINE 20 MG PO TABS
20 MG | Freq: Two times a day (BID) | ORAL | Status: DC
Start: 2022-05-06 — End: 2022-05-06
  Administered 2022-05-06: 15:00:00 20 mg via ORAL

## 2022-05-06 MED ORDER — IOPAMIDOL 61 % IV SOLN
61 % | Freq: Once | INTRAVENOUS | Status: AC | PRN
Start: 2022-05-06 — End: 2022-05-06
  Administered 2022-05-06: 14:00:00 100 mL via INTRAVENOUS

## 2022-05-06 MED ORDER — PANTOPRAZOLE SODIUM 40 MG PO TBEC
40 MG | Freq: Every day | ORAL | Status: DC
Start: 2022-05-06 — End: 2022-05-08
  Administered 2022-05-07 – 2022-05-08 (×2): 40 mg via ORAL

## 2022-05-06 MED ORDER — POTASSIUM CHLORIDE CRYS ER 20 MEQ PO TBCR
20 MEQ | Freq: Two times a day (BID) | ORAL | Status: AC
Start: 2022-05-06 — End: 2022-05-06
  Administered 2022-05-06 – 2022-05-07 (×2): 40 meq via ORAL

## 2022-05-06 MED ORDER — WARFARIN SODIUM 7.5 MG PO TABS
7.5 MG | Freq: Once | ORAL | Status: AC
Start: 2022-05-06 — End: 2022-05-06
  Administered 2022-05-06: 22:00:00 7.5 mg via ORAL

## 2022-05-06 MED FILL — NICOTINE 14 MG/24HR TD PT24: 14 MG/24HR | TRANSDERMAL | Qty: 1

## 2022-05-06 MED FILL — PREGABALIN 50 MG PO CAPS: 50 MG | ORAL | Qty: 1

## 2022-05-06 MED FILL — CITALOPRAM HYDROBROMIDE 20 MG PO TABS: 20 MG | ORAL | Qty: 1

## 2022-05-06 MED FILL — LOVENOX 40 MG/0.4ML IJ SOSY: 40 MG/0.4ML | INTRAMUSCULAR | Qty: 0.4

## 2022-05-06 MED FILL — COUMADIN 7.5 MG PO TABS: 7.5 MG | ORAL | Qty: 1

## 2022-05-06 MED FILL — ISOVUE-300 61 % IV SOLN: 61 % | INTRAVENOUS | Qty: 100

## 2022-05-06 MED FILL — POTASSIUM CHLORIDE CRYS ER 20 MEQ PO TBCR: 20 MEQ | ORAL | Qty: 2

## 2022-05-06 MED FILL — PRAVASTATIN SODIUM 20 MG PO TABS: 20 MG | ORAL | Qty: 2

## 2022-05-06 MED FILL — METOPROLOL SUCCINATE ER 50 MG PO TB24: 50 MG | ORAL | Qty: 1

## 2022-05-06 MED FILL — LAMOTRIGINE 25 MG PO TABS: 25 MG | ORAL | Qty: 1

## 2022-05-06 MED FILL — ASPIRIN LOW DOSE 81 MG PO CHEW: 81 MG | ORAL | Qty: 1

## 2022-05-06 MED FILL — ACETAMINOPHEN 325 MG PO TABS: 325 MG | ORAL | Qty: 2

## 2022-05-06 MED FILL — ARFORMOTEROL TARTRATE 15 MCG/2ML IN NEBU: 15 MCG/2ML | RESPIRATORY_TRACT | Qty: 2

## 2022-05-06 MED FILL — FAMOTIDINE 20 MG PO TABS: 20 MG | ORAL | Qty: 1

## 2022-05-06 MED FILL — FUROSEMIDE 40 MG PO TABS: 40 MG | ORAL | Qty: 1

## 2022-05-06 MED FILL — LORAZEPAM 1 MG PO TABS: 1 MG | ORAL | Qty: 1

## 2022-05-06 MED FILL — ONDANSETRON HCL 4 MG/2ML IJ SOLN: 4 MG/2ML | INTRAMUSCULAR | Qty: 2

## 2022-05-06 NOTE — Progress Notes (Signed)
Routine EEG completed at the bedside on 05/06/2022.

## 2022-05-06 NOTE — Plan of Care (Signed)
Problem: Discharge Planning  Goal: Discharge to home or other facility with appropriate resources  Outcome: Progressing     Problem: Chronic Conditions and Co-morbidities  Goal: Patient's chronic conditions and co-morbidity symptoms are monitored and maintained or improved  Outcome: Progressing     Problem: Confusion  Goal: Confusion, delirium, dementia, or psychosis is improved or at baseline  Description: INTERVENTIONS:  1. Assess for possible contributors to thought disturbance, including medications, impaired vision or hearing, underlying metabolic abnormalities, dehydration, psychiatric diagnoses, and notify attending LIP  2. Institute high risk fall precautions, as indicated  3. Provide frequent short contacts to provide reality reorientation, refocusing and direction  4. Decrease environmental stimuli, including noise as appropriate  5. Monitor and intervene to maintain adequate nutrition, hydration, elimination, sleep and activity  6. If unable to ensure safety without constant attention obtain sitter and review sitter guidelines with assigned personnel  7. Initiate Psychosocial CNS and Spiritual Care consult, as indicated  Outcome: Progressing     Problem: Safety - Adult  Goal: Free from fall injury  Outcome: Progressing

## 2022-05-06 NOTE — Progress Notes (Signed)
[facility]    Consult for Warfarin Dosing by Pharmacy by Dr. Jacqulyn Liner, Gwyndolyn Saxon  Consult provided for this 56 y.o.  female , for indication of Heart Valve Replacement (Mitral valve - bioprosthetic)    Dose to achieve an INR goal of 2.5 to 3.5 (confirmed with Dr. Jacqulyn Liner)  Home dose: 10 mg (2.5 mg x 4) every Tue, Sat; 7.5 mg (2.5 mg x 3) all other days     Warfarin  7.5 (mg) to be given today at 1800.     DATE INR DOSE   12/20 1.1 7.5 mg   12/21 1.2 7.5   12/22 1.3 7.5       Significant drug interactions: None  PT/INR Lab Results   Component Value Date/Time    INR 1.3 05/06/2022 04:23 AM    INR 2.2 04/07/2020 09:20 AM    INREXT 2.0 05/27/2021 12:00 AM       Platelets Lab Results   Component Value Date/Time    PLT 210 05/06/2022 04:23 AM      H/H Lab Results   Component Value Date/Time    HGB 12.8 05/06/2022 04:23 AM        Pharmacy to follow daily and will provide subsequent Warfarin dosing based on clinical status.     Signed Guilford Shi, [facility]

## 2022-05-06 NOTE — Progress Notes (Signed)
Progress Note    Patient: Mary [name] MRN: [id number]  SSN: [id number]    Date of Birth: March 06, 1966  Age: 56 y.o.  Sex: female      Admit Date: 05/04/2022    LOS: 2 days     Subjective:   No new complaints. Some dizziness which she feels is improving. Denies chest pain, focal weakness, confusion, HA, fevers or chills.     Patient tolerated swallow studies with Speech Therapy but had immediate emesis after all swallow trials. Had also complained of some left abdominal pain and some tenderness to palpation. CT abdomen ordered, pending.     Neurology evaluated patient, Dr. Abran Duke; appreciate recommendations.   Planning for EEG  Follow up TTE.     Otherwise, VSS; potassium 3.2. replete  INR 1.3;  coumadin dosing per pharmacy; goal INR 2.0-3.0  MRI brain with no acute abnormalities  CTA head and neck no acute abnormalities. Patent circulation      MBS:     IMPRESSION:     No aspiration or penetration with thin liquids. No additional substances  trialled.       CTA Head and Neck:  05/04/22:  IMPRESSION:     CTA Brain:  There is no larger vessel occlusion or significant focal stenosis.   There is a normal variant persistent fetal origin of the left PCA from the left  ICA.  Right vertebral artery is markedly dominant and small left vertebral  artery terminates in PICA.  Intracranial circulation is patent.       No significant parenchymal abnormalities seen.     CTA Neck:  Unremarkable CTA of the neck.  The right vertebral artery is  dominant.      MRI Brain 05/05/22:  IMPRESSION:     No acute ischemic change or hemorrhage.     Occasional small focus of nonspecific white matter lesions, most likely chronic  microvascular ischemic change.     Subtle volume loss over the frontoparietal convexities.  Brain otherwise  unremarkable for age.        ------------------------------    HPI:  [name] is a 56 y.o. female who past medical history of anxiety, bipolar 1 disorder, HFpEF, COPD, mitral valve replacement, PTSD.       Patient alert and oriented x3, but poor historian, poor recollection of events. Per report, patient was found down around 8 pm by caregiver, patient was unresponsive on the cough and contacted EMS. Upon arrival to ER, patient had blank stare but moving all extremities.      Stroke workup negative in ER. Tele neurology consulted. Unable to view notes but reportedly low suspicion for stroke. CT head negative. CTA head and neck pending radiology report.      Currently patient knows where she is and the date. Does not recall events leading up to her coming to hospital via EMS. Able to recall most of her medications. Says she has not been taking her lamotrigine for bipolar for the last 10 days or so. Otherwise has been taking her medications as prescribed. Says she takes coumadin alternating days 7.5mg , 10mg  .  INR 1.1      Has some reproducible chest pain left sided. No shortness of breath, no palpitations, no dizziness, no HA, nausea, vomit, fevers or chills. No focal deficits.      Per nursing patient has been pulling off her blood pressure cuff and tele lines.         ER Course:   Code stroke called  for encephalopathy     Vital signs showed elevated blood pressure 170/60, HR 58, 99% room air, afebrile. RR 11.   CBC, CMP unremarkable.   INR 1.1  UA unremarkable  UDS positive for THC.      CT head unremarkable  CTA head and neck pending.      CT Head 05/04/2022:  IMPRESSION:     No acute ischemic change or hemorrhage.  The brain looks normal for age.      Review of Systems:  A comprehensive review of systems was negative except for that written in the History of Present Illness.  CONSTITUTIONAL: Denies weight loss, fever, chills, weakness or fatigue.  HEENT:  Denies acute vision changes, No hearing loss or tinnitus, no nasal congestion or throat pain.  SKIN: No rash or itching.  CARDIOVASCULAR: Denies chest pain, chest pressure or chest discomfort. No palpitations or edema.  RESPIRATORY: denies shortness of breath,  cough or sputum.  GASTROINTESTINAL:  denies pain, distension, nausea, diarrhea  GENITOURINARY: denies dysuria, bleeding and incontinence.  MUSCULOSKELETAL: chest wall pain  NEUROLOGICAL: denies focal deficits  PSYCHIATRIC: Denies active changes in mentation, normal mood.      Objective:     Vitals:    05/05/22 1949 05/05/22 2348 05/06/22 0430 05/06/22 0814   BP: (!) 141/71 129/77 (!) 143/71 (!) 145/76   Pulse: 72 73 87 80   Resp: 18 18 18 18    Temp: 98.6 F (37 C) 99.7 F (37.6 C) 99.1 F (37.3 C) 98.8 F (37.1 C)   TempSrc: Oral Oral Oral Oral   SpO2: 95% 93% 93% 91%   Weight:       Height:            Intake and Output:  Current Shift: 12/22 0701 - 12/22 1900  In: 120 [P.O.:120]  Out: -    Last three shifts: No intake/output data recorded.            Physical Exam:   General: Well appearing, NAD  Skin: No rash, no lesions  HEENT: Normal appearing oropharynx; no LAD, no lesions; EOMI  Head: Normocephalic, atraumatic  Cardiovascular: regular rate and rhythm, no obvious murmurs noted, no edema  Pulmonary: CTA bilaterally, no obvious wheezing, rales, or rhonchi  Abdomen: Bowel sounds normal, soft, non distended, non tender, no rebound or guarding.   MSK:  No deformities, spine non tender; reproducible left chest wall tenderness.   Neuro: CN 2-12 intact, no focal deficits.   Psychiatric: normal mood, appropriate behavior, Alert and oriented x 3    Lab/Data Review:   Latest Reference Range & Units 05/06/22 04:23   Sodium 136 - 145 mmol/L 144   Potassium 3.5 - 5.5 mmol/L 3.2 (L)   Chloride 100 - 111 mmol/L 113 (H)   CO2 21 - 32 mmol/L 26   BUN,BUNPL 7.0 - 18 MG/DL 15   Creatinine 0.6 - 1.3 MG/DL 0.92   Bun/Cre Ratio 12 - 20   16   Anion Gap 3.0 - 18 mmol/L 5   Est, Glom Filt Rate >60 ml/min/1.30m2 >60   Magnesium 1.6 - 2.6 mg/dL 2.2   Glucose, Random 74 - 99 mg/dL 76   CALCIUM, SERUM, 500694 8.5 - 10.1 MG/DL 9.3   WBC 4.6 - 13.2 K/uL 8.0   RBC 4.20 - 5.30 M/uL 4.30   Hemoglobin Quant 12.0 - 16.0 g/dL 12.8   Hematocrit  35.0 - 45.0 % 40.0   MCV 78.0 - 100.0 FL 93.0   MCH 24.0 -  34.0 PG 29.8   MCHC 31.0 - 37.0 g/dL 16.1   MPV 9.2 - 09.6 FL 11.3   RDW 11.6 - 14.5 % 13.2   Platelet Count 135 - 420 K/uL 210   Neutrophils % 40 - 73 % 55   Lymphocyte % 21 - 52 % 38   Monocytes % 3 - 10 % 6   Eosinophils % 0 - 5 % 1   Basophils % 0 - 2 % 1   Neutrophils Absolute 1.8 - 8.0 K/UL 4.4   Lymphocytes Absolute 0.9 - 3.6 K/UL 3.0   Monocytes Absolute 0.05 - 1.2 K/UL 0.5   Eosinophils Absolute 0.0 - 0.4 K/UL 0.1   Basophils Absolute 0.0 - 0.1 K/UL 0.1   Differential Type -   AUTOMATED   Immature Granulocytes 0.0 - 0.5 % 0   Nucleated Red Blood Cells 0 PER 100 WBC  0.00 - 0.01 K/uL 0.0  0.00   Absolute Immature Granulocyte 0.00 - 0.04 K/UL 0.0   Prothrombin Time 11.9 - 14.7 sec 16.2 (H)   INR 0.9 - 1.1   1.3 (H)   (L): Data is abnormally low  (H): Data is abnormally high         Assessment:     Principal Problem:    Encephalopathy  Active Problems:    Acute encephalopathy  Resolved Problems:    * No resolved hospital problems. *       Plan:     Acute Encephalopathy  Unclear etiology. Was code stroke in ER. CT head negative  Possible toxic etiology, acute psychotic episode, hx of bipolar 1. Missed 10 days of lamictal.   UA negative, urine cultures pending  UDS positive for [facility]  Tele neuro consulted in ER. Patietn became more responsive to painful stimuli.   This morning, symptomatically improved. No focal deficits  Consult Neurology  Check lamictal levels. Resume home meds  Continue aspirin, statin  PT, OT  Speech eval/swallow evaluation; passed bedside swallow per nursing  Follow up EEG  MRI no acute findings  CTA head and neck no acute findings, patent circulation     Hx of Bioprosthetic mitral valve replacement / hx of Left atrial appendage ligation 2017 / pAFIB / HFpEF / HTN  Goal INR 2.0-3.0.   Primary Cardiologist Dr. Glade Nurse  Echo  Lasix 40mg  PO daily  Metoprolol succinate 50mg  daily  Aspirin, statin  Warfarin dosing per pharmacy.   Daily INR.       Abdominal Pain / Emesis:  Vomit with swallow eval.   Epigastric, left sided abdominal pain.   Dc pepcid start protonix.   CT abdomen     COPD:  Duonebs PRN  brovana     Tobacco Use   1/2 PPD  Nicotine patch     HLD  Pravastatin     Bipolar/Anxiety/PTSD  Lamictal 25mg  q8 hours  Celexa 20mg  daily     Marijuana Use:  THC positive UDS. Acknowledgues use. Denies other substance abuse    GI PPX: lovenox  DVT ppx:  Protonix    Signed By: , MD     May 06, 2022

## 2022-05-06 NOTE — Progress Notes (Signed)
PT order received and chart reviewed. PT evaluation attempt x2. Unable to see this morning due to getting EEG completed. Returned this afternoon and patient requesting pain medication prior to mobility. Nurse notified. Will continue to follow up as available. Thank you.   Ruta Hinds PT, DPT

## 2022-05-06 NOTE — Progress Notes (Signed)
Re:  Kariya, [place] up visit     05/06/2022 4:23 PM    SSN: [id number]    Subjective:   Mary [name] was seen on follow-up.  No acute changes overnight.  Mild confusion.  Still complains of lower extremity weakness.  MRI of the brain did not show any acute stroke; no mass lesions.    Medications:    Current Facility-Administered Medications   Medication Dose Route Frequency Provider Last Rate Last Admin    potassium chloride (KLOR-CON M) extended release tablet 40 mEq  40 mEq Oral BID Benjie Karvonen, MD   40 mEq at 05/06/22 1009    [START ON 05/07/2022] pantoprazole (PROTONIX) tablet 40 mg  40 mg Oral QAM AC Strong, Chrissie Noa, MD        warfarin (COUMADIN) tablet 7.5 mg  7.5 mg Oral Once Strong, Chrissie Noa, MD        sodium chloride flush 0.9 % injection 5-40 mL  5-40 mL IntraVENous 2 times per day Benjie Karvonen, MD   10 mL at 05/06/22 1006    sodium chloride flush 0.9 % injection 5-40 mL  5-40 mL IntraVENous PRN Strong, Chrissie Noa, MD        0.9 % sodium chloride infusion   IntraVENous PRN Strong, Chrissie Noa, MD        enoxaparin (LOVENOX) injection 40 mg  40 mg SubCUTAneous Daily Strong, Chrissie Noa, MD   40 mg at 05/05/22 2250    ondansetron (ZOFRAN-ODT) disintegrating tablet 4 mg  4 mg Oral Q8H PRN Strong, Chrissie Noa, MD        Or    ondansetron (ZOFRAN) injection 4 mg  4 mg IntraVENous Q6H PRN Strong, Chrissie Noa, MD   4 mg at 05/05/22 1145    polyethylene glycol (GLYCOLAX) packet 17 g  17 g Oral Daily PRN Benjie Karvonen, MD        acetaminophen (TYLENOL) tablet 650 mg  650 mg Oral Q6H PRN Benjie Karvonen, MD   650 mg at 05/06/22 1419    Or    acetaminophen (TYLENOL) suppository 650 mg  650 mg Rectal Q6H PRN Strong, Chrissie Noa, MD        citalopram (CELEXA) tablet 20 mg  20 mg Oral Daily Strong, Chrissie Noa, MD   20 mg at 05/06/22 1006    arformoterol tartrate (BROVANA) nebulizer solution 15 mcg  15 mcg Nebulization BID RT Benjie Karvonen, MD   15 mcg at 05/06/22 0921    albuterol (PROVENTIL)  (2.5 MG/3ML) 0.083% nebulizer solution 2.5 mg  2.5 mg Nebulization Q6H PRN Strong, Chrissie Noa, MD        pregabalin (LYRICA) capsule 200 mg  200 mg Oral TID Benjie Karvonen, MD   200 mg at 05/06/22 1420    furosemide (LASIX) tablet 40 mg  40 mg Oral Daily Benjie Karvonen, MD   40 mg at 05/06/22 1005    nicotine (NICODERM CQ) 14 MG/24HR 1 patch  1 patch TransDERmal Daily Benjie Karvonen, MD   1 patch at 05/05/22 2253    metoprolol succinate (TOPROL XL) extended release tablet 50 mg  50 mg Oral Daily Benjie Karvonen, MD   50 mg at 05/06/22 1005    lamoTRIgine (LAMICTAL) tablet 25 mg  25 mg Oral TID Benjie Karvonen, MD   25 mg at 05/06/22 1420    aspirin chewable tablet 81 mg  81 mg Oral Daily Strong, Chrissie Noa, MD  81 mg at 05/06/22 1005    pravastatin (PRAVACHOL) tablet 40 mg  40 mg Oral Nightly Benjie Karvonen, MD   40 mg at 05/05/22 2250    warfarin placeholder: dosing by pharmacy   Other RX Placeholder Benjie Karvonen, MD           Vital signs:  BP 138/70   Pulse 60   Temp 98.4 F (36.9 C) (Oral)   Resp 18   Ht 1.6 m (5\' 3" )   Wt 71.2 kg (157 lb)   SpO2 95%   BMI 27.81 kg/m     Review of Systems:   Constitutional no fever  Skin: Reddish lesion over the right upper extremity.  HENT no changes in hearing.  Eyes no changes in her vision.  Respiratory: Complains of difficulty breathing  Cardiovascular: Complains of chest pain.    Gastrointestinal: Abdominal pain; no diarrhea.   Genitourinary denies incontinence  Musculoskeletal: Has pain over the neck/upper back; lower extremities.  Hematology: No bleeding  Neurological as above in HPI      Patient Active Problem List   Diagnosis    Sensorineural hearing loss (SNHL) of both ears    Onychomycosis    Rib pain on left side    DDD (degenerative disc disease), lumbosacral    Dyslipidemia    Nicotine dependence    Severe obesity with body mass index (BMI) of 35.0 to 39.9 with serious comorbidity (HCC)    Asthma    Antral gastritis    Osteopenia     Hyperlipidemia    Diabetes mellitus (HCC)    COVID-19    Abrasion of right middle finger with infection    Chest congestion    Elevated INR    Moderate to severe pulmonary hypertension (HCC)    Onycholysis    Nodule of right lung    Respiratory failure (HCC)    Diastolic CHF, chronic (HCC)    Allergic rhinitis    Facial neuropathy, traumatic, left, sequela    History of mitral valve replacement with bioprosthetic valve    Severe obesity (BMI >= 40) (HCC)    Panlobular emphysema (HCC)    Anxiety disorder    Bipolar affective disorder, depressed in partial remission (HCC)    Suppurative tenosynovitis of flexor tendon of right hand    Chronic obstructive pulmonary disease (HCC)    Bipolar I disorder with depression, severe (HCC)    Heart valve pulmonary stenosis    TIA (transient ischemic attack)    Cellulitis and abscess of hand    Lumbar compression fracture (HCC)    Afib (HCC)    Abscess of finger    Mitral regurgitation    Chronic pain syndrome    Allergy    Mitral valve replaced    Chronic bilateral thoracic back pain    Thoracic compression fracture, sequela    Sepsis (HCC)    Bacteremia    Septic shock (HCC)    Compression fracture of T6 vertebra (HCC)    Hx of kyphoplasty, T8    Localized osteoporosis with current pathological fracture    Encephalopathy    Acute encephalopathy         Objective:   GENERAL:                  In no apparent distress.   EXTREMITIES:           Muscle tone is normal.  HEAD:  The patient is normocephalic.     NEUROLOGIC EXAMINATION    Mental status: Awake, alert, oriented to self/age; oriented to the month and year but not to the date/day; knows the president; follows commands.  No gaze deviation.  No asterixis.  No scheduled seizure-like activity.  Speech and languge: fluent, coherent,and comprehension intact  CN: VFF, EOMI, PERRLA, face sensation intact , no facial asymmetry noted, palate elevation symmetric bilat, SS+SCM 5/5 bilat, tongue midline  Motor:  Unable to lift both upper extremities because of pain over her neck/upper back; symmetric upper extremity strength; difficulty lifting her lower extremities; poor effort.    Sensory: Decreased light touch over the right side.  Coordination: FNF accurate w/o dysmetria  Gait: not tested      Narrative & Impression  EXAM: CTA BRAIN/NECK  CPT CODE: 70496/70498     CLINICAL INDICATION/HISTORY: .     COMPARISON: .     TECHNIQUE: Helical CT scan of the brain/neck was performed at 1.25 mm intervals  during rapid IV bolus contrast administration.  These data were reconstructed at  0.625 mm intervals for vascular analysis.  The data was also reviewed at 2.5 mm  intervals for accompanying soft tissue analysis.  Following this, image  reconstruction performed separately for the brain and neck on an independent  workstation with 3-D, maximum intensity projection (MIP) and shaded-volumetric  rendering of the major vessels was reviewed.  Contrast: Isovue-370 - 100 mL IV;  uneventful administration.     FINDINGS:      CTA BRAIN SOFT TISSUE ANALYSIS: Ventricular system, basilar cisterns and sulci  are appropriate for patient's stated age.  No areas of mass effect, edema,  intra-/extra-axial hemorrhage or abnormal calcifications noted.  Calvarium and  skull base are unremarkable.  Extracranial bony and soft tissues are  unremarkable.     CTA BRAIN VASCULAR ANALYSIS: Anterior circulation anatomy is balanced.   Cisternal vessels are widely patent.  Runoff vessels appear normal.  The  anterior communicating artery is patent.       The posterior fossa, the right vertebral artery is markedly dominant.  The small  nondominant left vertebral artery terminates in the left PICA.  There is no  visible vessel distal to the left PICA origin.  Other major branches of the  vertebrobasilar system are patent except for an absent P1 segment of the left  PCA.  The P2 portion is supplied from the left ICA via a prominent posterior  communicating artery  segment.  The right PCoA is not well seen.  PCA runoff is  otherwise patent, bilaterally.       No suggestions of focal stenotic disease to suggest prevalent atherosclerotic  change or larger vessel vasculitis.     CTA NECK SOFT TISSUE ANALYSIS: The mucosal surfaces of the aerodigestive tract  are unremarkable.  The salivary glands are normal.  The thyroid gland is  unremarkable.  Images through the root of the neck, extending to the superior  mediastinum, and lung apices, are clear.  There is no adenopathy seen.  Normal  vascular enhancement is identified.  There is been anterior cervical discectomy  and fusion at C3-4.  There has been anterior corpectomy and fusion.-C6 strut.   There is no significant canal stenosis.  No other osseous abnormalities are  seen.     CTA NECK VASCULAR ANALYSIS: The great vessels have a normal arrangement from the  aorta with widely patent ostia.  The common carotid arteries, cervical common  carotid bifurcations, and  cervical internal carotid arteries are widely patent,  bilaterally.  The vertebral arteries are widely patent with normal subclavian  origins.  The right vertebral artery is markedly dominant.  NASCET criteria were  used.     IMPRESSION:     CTA Brain:  There is no larger vessel occlusion or significant focal stenosis.   There is a normal variant persistent fetal origin of the left PCA from the left  ICA.  Right vertebral artery is markedly dominant and small left vertebral  artery terminates in PICA.  Intracranial circulation is patent.       No significant parenchymal abnormalities seen.     CTA Neck:  Unremarkable CTA of the neck.  The right vertebral artery is  dominant.       No soft tissue abnormalities identified.     Narrative & Impression  EXAM: CT BRAIN  CPT code: [id number]       CLINICAL INDICATION/HISTORY: Patient found unresponsive by caregiver.  Patient  is flaccid, vomiting and unresponsive.     COMPARISON: None.     TECHNIQUE: Contiguous noncontrast axial  images of the brain are obtained from  the foramen magnum to the vertex and reviewed in brain and bone windows.     FINDINGS: There are no focal parenchymal defects and there is no evidence of  mass, mass effect or intracranial hemorrhage.  The cerebrospinal fluid spaces  are normal for age.  No significant extra-axial abnormalities are seen.  The  pituitary fossa is unremarkable.  The mastoids and visualized paranasal sinuses  are clear.  The visualized orbits look grossly normal.  No significant bony  abnormalities are seen.     IMPRESSION:     No acute ischemic change or hemorrhage.  The brain looks normal for age.    Narrative & Impression  EXAM: MRI BRAIN WITHOUT CONTRAST  CPT code: [id number]     CLINICAL INDICATION/HISTORY: Patient found down by caregiver.  ER patient had  blank stare but moving all extremities..     COMPARISON: CT brain of 04 May 2022.     TECHNIQUE: MRI of the brain was performed by using standard protocol pulse  sequences.  Axial T2*FGRE images are obtained for remote trauma.  Additional  coronal FSPGR images are obtained to evaluate the temporal lobes in epilepsy.     FINDINGS:      There is no restricted diffusion.  There are two small foci of abnormal  increased signal intensity on long-TR images, one in the subcortical white  matter of the anterolateral right frontal lobe and the other in the  periventricular/pericallosal white matter of the anterior left lateral  ventricle.  There is no mass, mass effect or hemorrhage.  There is subtle  extra-axial sulcal prominence over the frontoparietal convexities, bilaterally.   The cerebrospinal fluid spaces are otherwise unremarkable for age.  No  significant extra-axial abnormalities are seen.  The midline structures are  unremarkable.  Flow voids are present in the major vessels at the base of the  brain.  Mastoids are clear.  Minimal mucosal thickening is seen in the ethmoid  air cells, bilaterally.  Other paranasal sinuses are clear.   Frontal sinuses are  hypoplastic, particularly on the left.  The orbits look grossly normal.  The  extracranial soft tissues are unremarkable.     IMPRESSION:     No acute ischemic change or hemorrhage.     Occasional small focus of nonspecific white matter lesions, most likely chronic  microvascular  ischemic change.     Subtle volume loss over the frontoparietal convexities.  Brain otherwise  unremarkable for age.           Impression:      A 56 years old female patient with above medical problems including aortic valve replacement on Coumadin was brought to the emergency room for altered mental status.  Was found by her caretaker unresponsive.  Subsequently, she was noticed to be staring per EMS.  She was not having any witnessed abnormal body movement.  Moving her extremities symmetrically.  She states that she has right lower extremity weakness and left upper extremity numbness; changes sides on repeated questioning.  Partial disorientation.  No asterixis.  CT scan of the brain unremarkable.  CT angiography of the head and neck did not show LVO.  No past history of seizure.  UDS positive for THC.  CMP unremarkable.  She has no fever or leukocytosis.  Differentials include metabolic encephalopathy, postictal state, functional neurologic disorder.  EEG from today did not show epileptiform discharges.     Plan:     -Follow metabolic panels and correct any changes  -Continue with Coumadin  -We will follow patient as needed.   -Call for questions      Sincerely,      Mary [name], M.D.          PLEASE NOTE:   This document has been produced using voice recognition software. Unrecognized errors in transcription may be present.

## 2022-05-07 LAB — ECHO (TTE) COMPLETE (PRN CONTRAST/BUBBLE/STRAIN/3D)
AR Max Velocity PISA: 4.3 m/s
AR PHT: 611.9 millisecond
AV Area by Peak Velocity: 1.6 cm2
AV Area by VTI: 1.7 cm2
AV Mean Gradient: 9 mmHg
AV Mean Velocity: 1.4 m/s
AV Peak Gradient: 19 mmHg
AV Peak Velocity: 2.2 m/s
AV VTI: 37.8 cm
AV Velocity Ratio: 0.5
AVA/BSA Peak Velocity: 0.9 cm2/m2
AVA/BSA VTI: 1 cm2/m2
Ao Root Index: 1.09 cm/m2
Aortic Root: 1.9 cm
Aortic Sinus Valsalva Index: 1.44 cm/m2
Aortic Sinus Valsalva: 2.5 cm
Body Surface Area: 1.78 m2
Est. RA Pressure: 15 mmHg
Fractional Shortening 2D: 31 % (ref 28–44)
IVSd: 1.1 cm — AB (ref 0.6–0.9)
LA Diameter: 3.9 cm
LA Size Index: 2.24 cm/m2
LA/AO Root Ratio: 2.05
LV Mass 2D Index: 119.1 g/m2 — AB (ref 43–95)
LV Mass 2D: 207.3 g — AB (ref 67–162)
LV RWT Ratio: 0.38
LVIDd Index: 2.99 cm/m2
LVIDd: 5.2 cm (ref 3.9–5.3)
LVIDs Index: 2.07 cm/m2
LVIDs: 3.6 cm
LVOT Area: 3.1 cm2
LVOT Diameter: 2 cm
LVOT Mean Gradient: 2 mmHg
LVOT Peak Gradient: 5 mmHg
LVOT Peak Velocity: 1.1 m/s
LVOT SV: 64.4 ml
LVOT Stroke Volume Index: 37 mL/m2
LVOT VTI: 20.5 cm
LVOT:AV VTI Index: 0.54
LVPWd: 1 cm — AB (ref 0.6–0.9)
MV A Velocity: 1.1 m/s
MV Area by PHT: 2.8 cm2
MV Area by VTI: 1.1 cm2
MV E Velocity: 1.64 m/s
MV E Wave Deceleration Time: 348.8 ms
MV E/A: 1.49
MV Max Velocity: 2.3 m/s
MV Mean Gradient: 7 mmHg
MV Mean Velocity: 1.2 m/s
MV PHT: 79.2 ms
MV Peak Gradient: 22 mmHg
MV VTI: 59.6 cm
MV:LVOT VTI Index: 2.91
PR Max Velocity: 1.7 m/s
PV Max Velocity: 1.2 m/s
PV Peak Gradient: 6 mmHg
Pulmonary Artery EDP: 12 mmHg
RV Mid Dimension: 2.5 cm
RVOT Peak Gradient: 3 mmHg
RVOT Peak Velocity: 0.9 m/s
RVSP: 52 mmHg
TR Max Velocity: 3.06 m/s
TR Peak Gradient: 37 mmHg

## 2022-05-07 LAB — CBC WITH AUTO DIFFERENTIAL
Basophils %: 1 % (ref 0–2)
Basophils Absolute: 0.1 10*3/uL (ref 0.0–0.1)
Eosinophils %: 2 % (ref 0–5)
Eosinophils Absolute: 0.1 10*3/uL (ref 0.0–0.4)
Hematocrit: 39.2 % (ref 35.0–45.0)
Hemoglobin: 12.5 g/dL (ref 12.0–16.0)
Immature Granulocytes %: 0 % (ref 0.0–0.5)
Immature Granulocytes Absolute: 0 10*3/uL (ref 0.00–0.04)
Lymphocytes %: 61 % — ABNORMAL HIGH (ref 21–52)
Lymphocytes Absolute: 3.6 10*3/uL (ref 0.9–3.6)
MCH: 30 PG (ref 24.0–34.0)
MCHC: 31.9 g/dL (ref 31.0–37.0)
MCV: 94.2 FL (ref 78.0–100.0)
MPV: 11.4 FL (ref 9.2–11.8)
Monocytes %: 6 % (ref 3–10)
Monocytes Absolute: 0.4 10*3/uL (ref 0.05–1.2)
Neutrophils %: 29 % — ABNORMAL LOW (ref 40–73)
Neutrophils Absolute: 1.7 10*3/uL — ABNORMAL LOW (ref 1.8–8.0)
Nucleated RBCs: 0 PER 100 WBC
Platelets: 201 10*3/uL (ref 135–420)
RBC: 4.16 M/uL — ABNORMAL LOW (ref 4.20–5.30)
RDW: 13.2 % (ref 11.6–14.5)
WBC: 5.8 10*3/uL (ref 4.6–13.2)
nRBC: 0 10*3/uL (ref 0.00–0.01)

## 2022-05-07 LAB — PROTIME-INR
INR: 1.6 — ABNORMAL HIGH (ref 0.9–1.1)
Protime: 19.5 s — ABNORMAL HIGH (ref 11.9–14.7)

## 2022-05-07 LAB — BASIC METABOLIC PANEL
Anion Gap: 4 mmol/L (ref 3.0–18)
BUN/Creatinine Ratio: 17 (ref 12–20)
BUN: 18 MG/DL (ref 7.0–18)
CO2: 25 mmol/L (ref 21–32)
Calcium: 8.7 MG/DL (ref 8.5–10.1)
Chloride: 113 mmol/L — ABNORMAL HIGH (ref 100–111)
Creatinine: 1.03 MG/DL (ref 0.6–1.3)
Est, Glom Filt Rate: >60 mL/min/{1.73_m2} (ref >60)
Glucose: 88 mg/dL (ref 74–99)
Potassium: 3.8 mmol/L (ref 3.5–5.5)
Sodium: 142 mmol/L (ref 136–145)

## 2022-05-07 LAB — POCT GLUCOSE
POC Glucose: 173 mg/dL — ABNORMAL HIGH (ref 70–110)
POC Glucose: 122 mg/dL — ABNORMAL HIGH (ref 70–110)
POC Glucose: 162 mg/dL — ABNORMAL HIGH (ref 70–110)

## 2022-05-07 LAB — MAGNESIUM: Magnesium: 1.9 mg/dL (ref 1.6–2.6)

## 2022-05-07 MED ORDER — POLYETHYLENE GLYCOL 3350 17 G PO PACK
17 g | Freq: Once | ORAL | Status: AC
Start: 2022-05-07 — End: 2022-05-07
  Administered 2022-05-07: 18:00:00 17 g via ORAL

## 2022-05-07 MED ORDER — LORAZEPAM 0.5 MG PO TABS
0.5 MG | ORAL | Status: DC | PRN
Start: 2022-05-07 — End: 2022-05-08
  Administered 2022-05-07 – 2022-05-08 (×4): 0.5 mg via ORAL

## 2022-05-07 MED ORDER — CETIRIZINE HCL 10 MG PO TABS
10 MG | Freq: Every day | ORAL | Status: DC
Start: 2022-05-07 — End: 2022-05-08
  Administered 2022-05-07 – 2022-05-08 (×2): 10 mg via ORAL

## 2022-05-07 MED ORDER — WARFARIN SODIUM 5 MG PO TABS
5 MG | Freq: Once | ORAL | Status: AC
Start: 2022-05-07 — End: 2022-05-07
  Administered 2022-05-07: 22:00:00 10 mg via ORAL

## 2022-05-07 MED ORDER — FLUTICASONE PROPIONATE 50 MCG/ACT NA SUSP
50 MCG/ACT | Freq: Every day | NASAL | Status: AC
Start: 2022-05-07 — End: 2022-05-08
  Administered 2022-05-07: 20:00:00 1 via NASAL

## 2022-05-07 MED ORDER — WARFARIN SODIUM 5 MG PO TABS
5 MG | Freq: Once | ORAL | Status: DC
Start: 2022-05-07 — End: 2022-05-07

## 2022-05-07 MED ORDER — EMPAGLIFLOZIN 10 MG PO TABS
10 MG | Freq: Every day | ORAL | Status: DC
Start: 2022-05-07 — End: 2022-05-08
  Administered 2022-05-07 – 2022-05-08 (×2): 10 mg via ORAL

## 2022-05-07 MED ORDER — DOCUSATE SODIUM 100 MG PO CAPS
100 MG | Freq: Two times a day (BID) | ORAL | Status: AC
Start: 2022-05-07 — End: 2022-05-08
  Administered 2022-05-07 – 2022-05-08 (×2): 100 mg via ORAL

## 2022-05-07 MED FILL — NICOTINE 14 MG/24HR TD PT24: 14 MG/24HR | TRANSDERMAL | Qty: 1

## 2022-05-07 MED FILL — PREGABALIN 50 MG PO CAPS: 50 MG | ORAL | Qty: 1

## 2022-05-07 MED FILL — CITALOPRAM HYDROBROMIDE 20 MG PO TABS: 20 MG | ORAL | Qty: 1

## 2022-05-07 MED FILL — WARFARIN SODIUM 5 MG PO TABS: 5 MG | ORAL | Qty: 2

## 2022-05-07 MED FILL — LAMOTRIGINE 25 MG PO TABS: 25 MG | ORAL | Qty: 1

## 2022-05-07 MED FILL — ACETAMINOPHEN 325 MG PO TABS: 325 MG | ORAL | Qty: 2

## 2022-05-07 MED FILL — CETIRIZINE HCL 10 MG PO TABS: 10 MG | ORAL | Qty: 1

## 2022-05-07 MED FILL — PANTOPRAZOLE SODIUM 40 MG PO TBEC: 40 MG | ORAL | Qty: 1

## 2022-05-07 MED FILL — LOVENOX 40 MG/0.4ML IJ SOSY: 40 MG/0.4ML | INTRAMUSCULAR | Qty: 0.4

## 2022-05-07 MED FILL — POLYETHYLENE GLYCOL 3350 17 G PO PACK: 17 g | ORAL | Qty: 1

## 2022-05-07 MED FILL — FLUTICASONE PROPIONATE 50 MCG/ACT NA SUSP: 50 MCG/ACT | NASAL | Qty: 16

## 2022-05-07 MED FILL — METOPROLOL SUCCINATE ER 50 MG PO TB24: 50 MG | ORAL | Qty: 1

## 2022-05-07 MED FILL — PRAVASTATIN SODIUM 20 MG PO TABS: 20 MG | ORAL | Qty: 2

## 2022-05-07 MED FILL — POTASSIUM CHLORIDE CRYS ER 20 MEQ PO TBCR: 20 MEQ | ORAL | Qty: 2

## 2022-05-07 MED FILL — ARFORMOTEROL TARTRATE 15 MCG/2ML IN NEBU: 15 MCG/2ML | RESPIRATORY_TRACT | Qty: 2

## 2022-05-07 MED FILL — FUROSEMIDE 40 MG PO TABS: 40 MG | ORAL | Qty: 1

## 2022-05-07 MED FILL — DOCUSATE SODIUM 100 MG PO CAPS: 100 MG | ORAL | Qty: 1

## 2022-05-07 MED FILL — LORAZEPAM 0.5 MG PO TABS: 0.5 MG | ORAL | Qty: 1

## 2022-05-07 MED FILL — JARDIANCE 10 MG PO TABS: 10 MG | ORAL | Qty: 1

## 2022-05-07 MED FILL — ASPIRIN LOW DOSE 81 MG PO CHEW: 81 MG | ORAL | Qty: 1

## 2022-05-07 NOTE — Plan of Care (Signed)
Problem: Physical Therapy - Adult  Goal: By Discharge: Performs mobility at highest level of function for planned discharge setting.  See evaluation for individualized goals.  Description: Physical Therapy Goals:  Initiated 05/07/2022 to be met within 7-10 days.    1.  Patient will move from supine to sit and sit to supine  in bed with modified independence.    2.  Patient will transfer from bed to chair and chair to bed with modified independence using the least restrictive device.  3.  Patient will perform sit to stand with modified independence.  4.  Patient will ambulate with modified independence for 150 feet with the least restrictive device.     PLOF: Patient was independent with mobility and ADLs.      Outcome: Progressing       PHYSICAL THERAPY EVALUATION    Patient: Mary [name] (56 y.o. female)  Date: 05/07/2022  Primary Diagnosis: Encephalopathy [G93.40]  Mitral valve replaced [Z95.2]  Acute encephalopathy [G93.40]       Precautions: Fall Risk, Aspiration Risk,  ,  ,  ,  ,  ,  ,      ASSESSMENT :  Patient resting in recliner chair with CNA present upon arrival. Agreeable to PT evaluation. She reports history of chronic back pain and generally decreased strength. CGA sit<>stand to RW. She ambulates with step to gait limited by arthritis pain in hands and has decreased endurance. Patient returns to chair and left with all needs in reach. She verbalizes to call for nursing assistance with mobility.y     DEFICITS/IMPAIRMENTS:    , Body Structures, Functions, Activity Limitations Requiring Skilled Therapeutic Intervention: Decreased functional mobility ;Decreased strength;Decreased safe awareness;Decreased endurance;Decreased balance;Increased pain    Patient will benefit from skilled intervention to address the above impairments.  Patient's rehabilitation potential/Therapy Prognosis: Good.  Factors which may influence rehabilitation potential include:   _0          None noted  _1          Mental  ability/status  _2          Medical condition  _3          Home/family situation and support systems  _4          Safety awareness  _5          Pain tolerance/management  _6          Other:      PLAN :  Recommendations and Planned Interventions:   _7            Bed Mobility Training             _8     Neuromuscular Re-Education  _9            Transfer Training                   _10     Orthotic/Prosthetic Training  _11            Gait Training                          _12     Modalities  _13            Therapeutic Exercises           _14     Edema Management/Control  _15            Therapeutic Activities            _16     Family Training/Education  _17   Patient Education  _0            Other (comment):    Frequency/Duration: Patient will be followed by physical therapy to address goals, 1-2 times per day/3-5 days per week to address goals.    Further Equipment Recommendations for Discharge: RW, SC    AMPAC: AM-PAC Inpatient Mobility Raw Score : 17      Current research shows that an AM-PAC score of 17 (13 without stairs) or less is not associated with a discharge to the patient's home setting. Based on an AM-PAC score and their current functional mobility deficits, it is recommended that the patient have 3-5 sessions per week of Physical Therapy at d/c to increase the patient's independence.     This AMPAC score should be considered in conjunction with interdisciplinary team recommendations to determine the most appropriate discharge setting. Patient's social support, diagnosis, medical stability, and prior level of function should also be taken into consideration.     SUBJECTIVE:   Patient stated "I dont have a place to go back to ."    OBJECTIVE DATA SUMMARY:     Past Medical History:   Diagnosis Date    Anxiety     Bipolar 1 disorder (HCC)     CHF (congestive heart failure) (HCC)     COPD (chronic obstructive pulmonary disease) (HCC)     DDD (degenerative disc disease), cervical     GERD (gastroesophageal reflux  disease)     H/O aortic valve replacement     History of transient ischemic attack (TIA)     Mitral regurgitation and mitral stenosis     PTSD (post-traumatic stress disorder)     Scoliosis     Vitamin D deficiency      Past Surgical History:   Procedure Laterality Date    CARDIAC CATHETERIZATION      CARDIAC VALVE SURGERY      mitral valve replacement    CESAREAN SECTION      HAND/FINGER SURGERY UNLISTED Right     I&D right middle finger    HYSTERECTOMY (CERVIX STATUS UNKNOWN)      IR KYPHOPLASTY THORACIC FIRST LEVEL  03/03/2022    IR KYPHOPLASTY THORACIC FIRST LEVEL 03/03/2022 Samoilov, Dmitri E, MD [facility] RAD ANGIO IR    KNEE SURGERY      OVARY REMOVAL         Home Situation:  Social/Functional History  Lives With: Other (comment) (group home)  Type of Home:  (group home)  Home Layout: One level  Home Access: Level entry  Bathroom Shower/Tub: Administrator, Civil Service: Standard  Bathroom Equipment: None  Bathroom Accessibility: Accessible  Home Equipment: Reita May Help From: Other (comment) ([employer]son Scientist, forensic, however lives in a residential boarding house.)  ADL Assistance: Independent  Homemaking Assistance: Needs assistance (Lives in a residential boarding house)  Ambulation Assistance: Independent  Transfer Assistance: Independent  Active Driver: No  Critical Behavior:          Strength:    Strength: Generally decreased, functional    Range Of Motion:  AROM: Within functional limits       Functional Mobility:  Bed Mobility:     Bed Mobility Training  Bed Mobility Training: No  Transfers:     Training and development officer  Sit to Stand: Contact-guard assistance  Stand to Sit: Contact-guard assistance  Balance:     Balance  Sitting: Intact  Standing: With support  Standing - Static: Good  Standing - Dynamic: Fair  Ambulation/Gait Training:     Gait  Overall Level of Assistance: Contact-guard assistance  Distance (ft): 20 Feet  Assistive Device: Walker, rolling  Interventions: Verbal cues  Step Length:  Right shortened;Left shortened  Gait Abnormalities: Decreased step clearance;Step to gait      Pain:  Pain level pre-treatment: -/10   Pain level post-treatment: -/10   Pain Intervention(s): Medication (see MAR); Rest, Ice, Repositioning  Response to intervention: Nurse notified     Activity Tolerance:    fair  Please refer to the flowsheet for vital signs taken during this treatment.    After treatment:   _0          Patient left in no apparent distress sitting up in chair  _1          Patient left in no apparent distress in bed  _2          Call bell left within reach  _3          Nursing notified  _4          Caregiver present  _5          Bed alarm activated  _6          SCDs applied    COMMUNICATION/EDUCATION:   Patient Education  Education Given To: Patient  Education Provided: Role of Therapy;Plan of Care;Fall Prevention Medical laboratory scientific officer Method: Verbal;Teach Back  Barriers to Learning: Cognition  Education Outcome: Verbalized understanding;Continued education needed    Thank you for this referral.  Ruta Hinds, PT  Minutes: 16      Eval Complexity: Decision Making: Medium Complexity

## 2022-05-07 NOTE — Procedures (Signed)
Pt's Name: Mary [name]   MR#: [id number]  DOB: 11/26/1965  DATE OF SERVICE: 05/06/2022     REFERRING PHYSICIAN:  Janeece Fitting MD.      EEG NUMBER:  [id number]     CLINICAL:  Admitted to the hospital for AMS.     Reason for the EEG: Eval of encephalopathy.      Patient's MEDICATIONS:  LAMICTAL, WARFARIN, TYLENOL, CELEXA, BROVANA, LYRICA, TOPROL XL, ASPIRIN 81mg , PRAVACHOL    EEG technique: Standard 10-20 system of electrode placement with an EKG. Activation procedure used was photic stimulation. Total duration of the recording 21 minutes. This is an awake EEG recording.      EEG REPORT:  The predominant background consists of posterior dominant alpha rhythms at a frequency of 9-10 Hz and attenuate to eye opening. No significant asymmetry. There are no obvious spikes or sharp wave discharges. No focal slowing. No abnormal discharges during photic stimulation.      IMPRESSION:  This is a normal EEG recording.     Clinical Correlation:  Normal EEG doesn't rule out the possibility of seizures or epilepsy.       Willaim Mode D. Suezanne Jacquet, MD
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
## 2022-05-07 NOTE — Progress Notes (Signed)
[facility] Dosing Services    Consult for Warfarin Dosing by Pharmacy by Dr. Jacqulyn Liner, Gwyndolyn Saxon  Consult provided for this 56 y.o.  female , for indication of Heart Valve Replacement (Mitral valve - bioprosthetic)    Dose to achieve an INR goal of 2.5 to 3.5 (confirmed with Dr. Jacqulyn Liner)  Home dose: 10 mg (2.5 mg x 4) every Tue, Sat; 7.5 mg (2.5 mg x 3) all other days     Warfarin  10 (mg) to be given today at 1800.     DATE INR DOSE   12/20 1.1 7.5 mg   12/21 1.2 7.5   12/22 1.3 7.5   12/23 1.6 10       Significant drug interactions: None  PT/INR Lab Results   Component Value Date/Time    INR 1.6 05/07/2022 01:16 AM    INR 2.2 04/07/2020 09:20 AM    INREXT 2.0 05/27/2021 12:00 AM       Platelets Lab Results   Component Value Date/Time    PLT 201 05/07/2022 01:16 AM      H/H Lab Results   Component Value Date/Time    HGB 12.5 05/07/2022 01:16 AM        Pharmacy to follow daily and will provide subsequent Warfarin dosing based on clinical status.     Signed Guilford Shi, [facility]
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
## 2022-05-08 LAB — BASIC METABOLIC PANEL
Anion Gap: 4 mmol/L (ref 3.0–18)
BUN/Creatinine Ratio: 20 (ref 12–20)
BUN: 19 MG/DL — ABNORMAL HIGH (ref 7.0–18)
CO2: 29 mmol/L (ref 21–32)
Calcium: 8.8 MG/DL (ref 8.5–10.1)
Chloride: 106 mmol/L (ref 100–111)
Creatinine: 0.95 MG/DL (ref 0.6–1.3)
Est, Glom Filt Rate: >60 mL/min/{1.73_m2} (ref >60)
Glucose: 89 mg/dL (ref 74–99)
Potassium: 3.7 mmol/L (ref 3.5–5.5)
Sodium: 139 mmol/L (ref 136–145)

## 2022-05-08 LAB — CBC WITH AUTO DIFFERENTIAL
Basophils %: 1 % (ref 0–2)
Basophils Absolute: 0.1 10*3/uL (ref 0.0–0.1)
Eosinophils %: 3 % (ref 0–5)
Eosinophils Absolute: 0.2 10*3/uL (ref 0.0–0.4)
Hematocrit: 41.1 % (ref 35.0–45.0)
Hemoglobin: 13.3 g/dL (ref 12.0–16.0)
Immature Granulocytes %: 0 % (ref 0.0–0.5)
Immature Granulocytes Absolute: 0 10*3/uL (ref 0.00–0.04)
Lymphocytes %: 57 % — ABNORMAL HIGH (ref 21–52)
Lymphocytes Absolute: 3.7 10*3/uL — ABNORMAL HIGH (ref 0.9–3.6)
MCH: 29.8 PG (ref 24.0–34.0)
MCHC: 32.4 g/dL (ref 31.0–37.0)
MCV: 91.9 FL (ref 78.0–100.0)
MPV: 10.7 FL (ref 9.2–11.8)
Monocytes %: 7 % (ref 3–10)
Monocytes Absolute: 0.5 10*3/uL (ref 0.05–1.2)
Neutrophils %: 32 % — ABNORMAL LOW (ref 40–73)
Neutrophils Absolute: 2.1 10*3/uL (ref 1.8–8.0)
Nucleated RBCs: 0 PER 100 WBC
Platelets: 189 10*3/uL (ref 135–420)
RBC: 4.47 M/uL (ref 4.20–5.30)
RDW: 12.7 % (ref 11.6–14.5)
WBC: 6.5 10*3/uL (ref 4.6–13.2)
nRBC: 0 10*3/uL (ref 0.00–0.01)

## 2022-05-08 LAB — MAGNESIUM: Magnesium: 2 mg/dL (ref 1.6–2.6)

## 2022-05-08 LAB — PROTIME-INR
INR: 1.8 — ABNORMAL HIGH (ref 0.9–1.1)
Protime: 20.9 s — ABNORMAL HIGH (ref 11.9–14.7)

## 2022-05-08 MED ORDER — LORAZEPAM 0.5 MG PO TABS
0.5 MG | ORAL_TABLET | ORAL | 0 refills | Status: AC | PRN
Start: 2022-05-08 — End: 2022-05-13

## 2022-05-08 MED ORDER — LAMOTRIGINE 25 MG PO TABS
25 MG | ORAL_TABLET | Freq: Three times a day (TID) | ORAL | 3 refills | Status: DC
Start: 2022-05-08 — End: 2022-05-23

## 2022-05-08 MED ORDER — OMEPRAZOLE 40 MG PO CPDR
40 MG | ORAL_CAPSULE | Freq: Every day | ORAL | 1 refills | Status: AC
Start: 2022-05-08 — End: ?

## 2022-05-08 MED ORDER — NICOTINE 14 MG/24HR TD PT24
14 MG/24HR | MEDICATED_PATCH | Freq: Every day | TRANSDERMAL | 3 refills | Status: DC
Start: 2022-05-08 — End: 2022-05-23

## 2022-05-08 MED ORDER — WARFARIN SODIUM 7.5 MG PO TABS
7.5 MG | Freq: Once | ORAL | Status: DC
Start: 2022-05-08 — End: 2022-05-08

## 2022-05-08 MED ORDER — EMPAGLIFLOZIN 10 MG PO TABS
10 MG | ORAL_TABLET | Freq: Every day | ORAL | 3 refills | Status: DC
Start: 2022-05-08 — End: 2022-05-23

## 2022-05-08 MED FILL — ARFORMOTEROL TARTRATE 15 MCG/2ML IN NEBU: 15 MCG/2ML | RESPIRATORY_TRACT | Qty: 2

## 2022-05-08 MED FILL — LORAZEPAM 0.5 MG PO TABS: 0.5 MG | ORAL | Qty: 1

## 2022-05-08 MED FILL — PREGABALIN 50 MG PO CAPS: 50 MG | ORAL | Qty: 1

## 2022-05-08 MED FILL — PANTOPRAZOLE SODIUM 40 MG PO TBEC: 40 MG | ORAL | Qty: 1

## 2022-05-08 MED FILL — JARDIANCE 10 MG PO TABS: 10 MG | ORAL | Qty: 1

## 2022-05-08 MED FILL — DOCUSATE SODIUM 100 MG PO CAPS: 100 MG | ORAL | Qty: 1

## 2022-05-08 MED FILL — PRAVASTATIN SODIUM 20 MG PO TABS: 20 MG | ORAL | Qty: 2

## 2022-05-08 MED FILL — METOPROLOL SUCCINATE ER 50 MG PO TB24: 50 MG | ORAL | Qty: 1

## 2022-05-08 MED FILL — LOVENOX 40 MG/0.4ML IJ SOSY: 40 MG/0.4ML | INTRAMUSCULAR | Qty: 0.4

## 2022-05-08 MED FILL — NICOTINE 14 MG/24HR TD PT24: 14 MG/24HR | TRANSDERMAL | Qty: 1

## 2022-05-08 MED FILL — ASPIRIN LOW DOSE 81 MG PO CHEW: 81 MG | ORAL | Qty: 1

## 2022-05-08 MED FILL — CITALOPRAM HYDROBROMIDE 20 MG PO TABS: 20 MG | ORAL | Qty: 1

## 2022-05-08 MED FILL — LAMOTRIGINE 25 MG PO TABS: 25 MG | ORAL | Qty: 1

## 2022-05-08 MED FILL — CETIRIZINE HCL 10 MG PO TABS: 10 MG | ORAL | Qty: 1

## 2022-05-08 MED FILL — FUROSEMIDE 40 MG PO TABS: 40 MG | ORAL | Qty: 1

## 2022-05-08 NOTE — Discharge Summary (Signed)
Discharge Summary     Patient: Mary HausenKaren Vellucci MRN: [id number]  SSN: [id number]    Date of Birth: 1965/11/08  Age: 56 y.o.  Sex: female       Admit Date: 05/04/2022    Discharge Date: 05/08/2022      Admission Diagnoses: Encephalopathy [G93.40]  Mitral valve replaced [Z95.2]  Acute encephalopathy [G93.40]    Discharge Diagnoses:    Acute Encephalopathy  Bipolar 1  Hx of Mitral valve replacement  HTN  COPD      Discharge Condition: Good    Hospital Course: Patient admitted with acute encephalopathy of uncertain etiology. Encaphalopathy workup unremarkable including CT head, MRI, labs, ammonia. [facility] which patient says she bought a new vape pen from a smoke shop which she hadn't tried before possibly leading to her confusion which was short lived and had improved while still in the ER. Was not septic during hospital stay. No antibiotics given.   Had echocardiogram which was unchanged from previous. Had EEG which was also negative.     Patient has hx of bipolar 1 and had not taken her lamotrigine for at least 10 days prior to her presenting to the hospital. Resumed meds and restarted her lorazepam and had improvement of her mood. No psychotic episodes but did show some severe anxiety and paranoid thoughts. No indication to keep patient in the hospital. Needs to follow up outaptient with therapist and Psychiatry.      Recommendations:  Continue lamictal 25mg  TID, celexa 20mg  daily  Ativan 0.5mg  BID PRN. 15 tabs prescribed. May need refills until she gets in to see psychiatrist.   Continue regular dose of warfarin 10mg  on T/Sat; rest of days 7.5mg  daily  Continue rest of home meds  Needs to schedule outpatient follow up with Cardiology, Dr. Glade NurseVacek for her hx of MV replacement  Returning to boarding home with home health PT for deconditioning. No focal deficits. Uses walker for ambulation  Check labs CBC, CMP, magnesium. Lamictal levels  Monitor tobacco use; nicotine patch    Consults: Neurology Case  Management    HPI:  Mary HausenKaren Bosserman is a 56 y.o. female who past medical history of anxiety, bipolar 1 disorder, HFpEF, COPD, mitral valve replacement, PTSD.      Patient alert and oriented x3, but poor historian, poor recollection of events. Per report, patient was found down around 8 pm by caregiver, patient was unresponsive on the cough and contacted EMS. Upon arrival to ER, patient had blank stare but moving all extremities.      Stroke workup negative in ER. Tele neurology consulted. Unable to view notes but reportedly low suspicion for stroke. CT head negative. CTA head and neck pending radiology report.      Currently patient knows where she is and the date. Does not recall events leading up to her coming to hospital via EMS. Able to recall most of her medications. Says she has not been taking her lamotrigine for bipolar for the last 10 days or so. Otherwise has been taking her medications as prescribed. Says she takes coumadin alternating days 7.5mg , 10mg  .  INR 1.1      Has some reproducible chest pain left sided. No shortness of breath, no palpitations, no dizziness, no HA, nausea, vomit, fevers or chills. No focal deficits.      Per nursing patient has been pulling off her blood pressure cuff and tele lines.         ER Course:   Code stroke  called for encephalopathy     Vital signs showed elevated blood pressure 170/60, HR 58, 99% room air, afebrile. RR 11.   CBC, CMP unremarkable.   INR 1.1  UA unremarkable  UDS positive for THC.      CT head unremarkable  CTA head and neck pending.      CT Head 05/04/2022:  IMPRESSION:     No acute ischemic change or hemorrhage.  The brain looks normal for age.      Acute Encephalopathy  Unclear etiology. Was code stroke in ER. CT head negative  Possible toxic etiology, acute psychotic episode, hx of bipolar 1. Missed 10 days of lamictal.   UA negative, urine cultures pending  UDS positive for [facility]  Tele neuro consulted in ER. Patietn became more responsive to painful  stimuli.   This morning, symptomatically improved. No focal deficits  Consult Neurology  Check lamictal levels. Resume home meds  Continue aspirin, statin  PT, OT  Speech eval/swallow evaluation; passed bedside swallow per nursing  Follow up EEG; results not released  MRI no acute findings  CTA head and neck no acute findings, patent circulation     Hx of Bioprosthetic mitral valve replacement / hx of Left atrial appendage ligation 2017 / pAFIB / HFpEF / HTN / Moderate MR, moderate aortic regurge   Goal INR 2.5-3.5   Primary Cardiologist Dr. Theodoro Parma  Echo; normal EF; essentially unchanged from 07/2021. Needs outpatient follow up with Dr. Theodoro Parma  Lasix 40mg  PO daily  Metoprolol succinate 50mg  daily  Aspirin, statin  Warfarin dosing per pharmacy.   Daily INR.      Abdominal Pain / Emesis:  Vomit with swallow eval.   Epigastric, left sided abdominal pain.   Dc pepcid start protonix.   CT abdomen unremarkable     COPD:  Duonebs PRN  brovana     Tobacco Use   1/2 PPD  Nicotine patch     HLD  Pravastatin     Bipolar/Anxiety/PTSD  Lamictal 25mg  q8 hours; had missed 10 days of meds prior to admission  Celexa 20mg  daily  Ativan PRN     Marijuana Use:  THC positive UDS. Acknowledgues use. Denies other substance abuse     GI PPX: lovenox  DVT ppx:  Protonix       Significant Diagnostic Studies: EEG: 05/07/22:  IMPRESSION:  This is a normal EEG recording.      Clinical Correlation:  Normal EEG doesn't rule out the possibility of seizures or epilepsy.      TTE:  05/06/22:  Interpretation Summary          Left Ventricle: Low normal left ventricular systolic function with a visually estimated EF of 50 - 55%. Left ventricle is dilated. Normal wall thickness. Normal wall motion.    Aortic Valve: Mild annular calcification. Moderate regurgitation. Mild stenosis of the aortic valve. AV mean gradient is 9 mmHg. AV area by continuity VTI is 1.7 cm2.    Mitral Valve: Bioprosthetic valve. MV mean gradient is 7 mmHg. Moderate stenosis noted.  Elevated prosthetic gradient.    Tricuspid Valve: Moderate regurgitation. The estimated RVSP is 52 mmHg.    Left Atrium: Left atrium is dilated.    Pulmonary Arteries: Moderate pulmonary hypertension present.    Image quality is fair. Limited study due to patient's ability to tolerate test.           CT Abdomen Pelvis: 05/05/22  IMPRESSION:     No acute abnormality is identified in  the abdomen or pelvis.  Mild basal atelectasis is evident.        MBS:     IMPRESSION:     No aspiration or penetration with thin liquids. No additional substances  trialled.        CTA Head and Neck:  05/04/22:  IMPRESSION:     CTA Brain:  There is no larger vessel occlusion or significant focal stenosis.   There is a normal variant persistent fetal origin of the left PCA from the left  ICA.  Right vertebral artery is markedly dominant and small left vertebral  artery terminates in PICA.  Intracranial circulation is patent.       No significant parenchymal abnormalities seen.     CTA Neck:  Unremarkable CTA of the neck.  The right vertebral artery is  dominant.       MRI Brain 05/05/22:  IMPRESSION:     No acute ischemic change or hemorrhage.     Occasional small focus of nonspecific white matter lesions, most likely chronic  microvascular ischemic change.     Subtle volume loss over the frontoparietal convexities.  Brain otherwise  unremarkable for age.      Physical Examination:   GENERAL ASSESSMENT: well developed and well nourished  SKIN: normal color, no lesions  HEAD: normocephalic  EYES: normal eyes  EARS: normal  NOSE: normal external appearance and nares patent  MOUTH: normal mouth and throat  NECK: normal  CHEST: normal air exchange, no rales, no rhonchi, no wheezes, respiratory effort normal with no retractions  HEART: regular rate and rhythm, normal S1/S2, no murmurs  ABDOMEN: soft, non-distended, no masses, no hepatosplenomegaly  EXTREMITY: normal and symmetric movement, normal range of motion, no joint swelling  NEURO: no  focal deficitis    Disposition: home/ boarding home    Discharge Medications:   Current Discharge Medication List        START taking these medications    Details   LORazepam (ATIVAN) 0.5 MG tablet Take 1 tablet by mouth every 4 hours as needed for Anxiety for up to 5 days. Max Daily Amount: 3 mg  Qty: 15 tablet, Refills: 0    Associated Diagnoses: Bipolar I disorder with depression, severe (HCC)      lamoTRIgine (LAMICTAL) 25 MG tablet Take 1 tablet by mouth in the morning, at noon, and at bedtime  Qty: 30 tablet, Refills: 3      empagliflozin (JARDIANCE) 10 MG tablet Take 1 tablet by mouth daily  Qty: 30 tablet, Refills: 3      nicotine (NICODERM CQ) 14 MG/24HR Place 1 patch onto the skin daily  Qty: 30 patch, Refills: 3           CONTINUE these medications which have CHANGED    Details   omeprazole (PRILOSEC) 40 MG delayed release capsule Take 1 capsule by mouth daily  Qty: 30 capsule, Refills: 1           CONTINUE these medications which have NOT CHANGED    Details   pravastatin (PRAVACHOL) 40 MG tablet Take 1 tablet by mouth daily  Qty: 90 tablet, Refills: 2      warfarin (COUMADIN) 2.5 MG tablet Take 1 tablet by mouth daily  Qty: 90 tablet, Refills: 2      TRELEGY ELLIPTA 100-62.5-25 MCG/ACT AEPB inhaler inhale 1 puff by mouth and INTO THE LUNGS once daily      pregabalin (LYRICA) 200 MG capsule Take 1 capsule by mouth 3 times daily  for 180 days.  Qty: 90 capsule, Refills: 5    Associated Diagnoses: Chronic left-sided low back pain without sciatica; Other intervertebral disc degeneration, lumbosacral region; Chronic pain syndrome      baclofen (LIORESAL) 10 MG tablet Take 0.5-1 tablets by mouth 3 times daily as needed (pain/spasm)  Qty: 60 tablet, Refills: 0    Associated Diagnoses: Chronic left-sided low back pain without sciatica      Ubrogepant (UBRELVY) 100 MG TABS Take 100 mg by mouth as needed (Migraine) Take 100 mg at onset of headache; can repeat in 2 hours if no change; not to be taken more than 2  days a week.  Qty: 10 tablet, Refills: 3    Associated Diagnoses: Migraine without aura and without status migrainosus, not intractable      metoprolol succinate (TOPROL XL) 50 MG extended release tablet Take 1 tablet by mouth daily  Qty: 90 tablet, Refills: 3      Acetaminophen (TYLENOL ARTHRITIS PAIN PO) Take by mouth      citalopram (CELEXA) 20 MG tablet Take 1 tablet by mouth daily  Qty: 30 tablet, Refills: 0      vitamin B-12 (CYANOCOBALAMIN) 1000 MCG tablet Take 1 tablet by mouth daily      albuterol sulfate HFA (PROVENTIL;VENTOLIN;PROAIR) 108 (90 Base) MCG/ACT inhaler Inhale 2 puffs into the lungs every 6 hours as needed      aspirin 81 MG chewable tablet Take 1 tablet by mouth daily      cetirizine (ZYRTEC) 10 MG tablet Take 1 tablet by mouth daily      docusate (COLACE, DULCOLAX) 100 MG CAPS [employer]-R[employer] 100 mg capsule   take 1 capsule by mouth twice a day if needed for constipation      fluticasone (FLONASE) 50 MCG/ACT nasal spray 1 spray by nasal route daily      furosemide (LASIX) 40 MG tablet Take 1 tablet by mouth daily as needed           STOP taking these medications       nitroGLYCERIN (NITROSTAT) 0.4 MG SL tablet Comments:   Reason for Stopping:         ondansetron (ZOFRAN) 4 MG tablet Comments:   Reason for Stopping:               Activity: activity as tolerated  Diet: cardiac diet  Wound Care: none needed    Follow up with Dr. Barrington Ellison 1 week of discharge    Time for discharge > 45 minutes    Signed By: Benjie Karvonen, MD     May 08, 2022

## 2022-05-08 NOTE — Progress Notes (Signed)
Progress Note    Patient: Mary HausenKaren [name] MRN: [id number]  SSN: [id number]    Date of Birth: 27-Sep-1965  Age: 56 y.o.  Sex: female      Admit Date: 05/04/2022    LOS: 4 days     Subjective:   Patient feeling much better this morning. Slept well. Received dose of ativan which helped calm her nerves. Still feels some general weakness but is ambulating with walker without issue.     Dispo planning potentially for home with home health.     Patient yesterday very concerned about not having a place to go upon discharge due to her landlord not letting her come back. However, patient says that she spoke with the landlord today who says that she is able to come back. Patient says she is planning to follow up outpatient with psychiatry and therapist. Also says she plans to take her mood stabilizer as prescribed. Patient is medically stable to go home with close follow up outpatient.   Discussed with case manager who will call the home and confirm that patient has place to stay.     INR increasing towards goal. Discussed with pharmacy. Patient can return to her home dose of warfarin 10mg  on Tuesdays/Saturdays and 7.5mg  on rest of days.     Patient hx of paranoia had made accusations to nurses yesterday which patient acknowledges she was very stressed. Says that new medication regimen is helping significantly and feels much better today especially knowing she can return to her home.     Needs outpatient psychiatry and therapy. No SI/HI. No indication to keep patient in the hospital.   Continue lamictal, celexa, ativan PRN.     Continue Bowel regimen .     Neurology evaluated patient, Dr. Abran DukeArasho; appreciate recommendations.     Normal EEG    TTE appears with no significant changes from 07/2021. Needs outpatient follow up with Dr. Glade NurseVacek.     CT Abdomen unremarkable. Patient with improved appetite, tolerating diet.     Otherwise, VSS; am labs unremarkable.     INR 1.8;  coumadin dosing per pharmacy; goal INR 2.5-3.5    MRI brain  with no acute abnormalities  CTA head and neck no acute abnormalities. Patent circulation    EEG: 05/07/22:  IMPRESSION:  This is a normal EEG recording.      Clinical Correlation:  Normal EEG doesn't rule out the possibility of seizures or epilepsy.     TTE:  05/06/22:  Interpretation Summary         Left Ventricle: Low normal left ventricular systolic function with a visually estimated EF of 50 - 55%. Left ventricle is dilated. Normal wall thickness. Normal wall motion.    Aortic Valve: Mild annular calcification. Moderate regurgitation. Mild stenosis of the aortic valve. AV mean gradient is 9 mmHg. AV area by continuity VTI is 1.7 cm2.    Mitral Valve: Bioprosthetic valve. MV mean gradient is 7 mmHg. Moderate stenosis noted. Elevated prosthetic gradient.    Tricuspid Valve: Moderate regurgitation. The estimated RVSP is 52 mmHg.    Left Atrium: Left atrium is dilated.    Pulmonary Arteries: Moderate pulmonary hypertension present.    Image quality is fair. Limited study due to patient's ability to tolerate test.        CT Abdomen Pelvis: 05/05/22  IMPRESSION:     No acute abnormality is identified in the abdomen or pelvis.  Mild basal atelectasis is evident.      MBS:  IMPRESSION:     No aspiration or penetration with thin liquids. No additional substances  trialled.       CTA Head and Neck:  05/04/22:  IMPRESSION:     CTA Brain:  There is no larger vessel occlusion or significant focal stenosis.   There is a normal variant persistent fetal origin of the left PCA from the left  ICA.  Right vertebral artery is markedly dominant and small left vertebral  artery terminates in PICA.  Intracranial circulation is patent.       No significant parenchymal abnormalities seen.     CTA Neck:  Unremarkable CTA of the neck.  The right vertebral artery is  dominant.      MRI Brain 05/05/22:  IMPRESSION:     No acute ischemic change or hemorrhage.     Occasional small focus of nonspecific white matter lesions, most likely  chronic  microvascular ischemic change.     Subtle volume loss over the frontoparietal convexities.  Brain otherwise  unremarkable for age.        ------------------------------    HPI:  Mary [name] is a 56 y.o. female who past medical history of anxiety, bipolar 1 disorder, HFpEF, COPD, mitral valve replacement, PTSD.      Patient alert and oriented x3, but poor historian, poor recollection of events. Per report, patient was found down around 8 pm by caregiver, patient was unresponsive on the cough and contacted EMS. Upon arrival to ER, patient had blank stare but moving all extremities.      Stroke workup negative in ER. Tele neurology consulted. Unable to view notes but reportedly low suspicion for stroke. CT head negative. CTA head and neck pending radiology report.      Currently patient knows where she is and the date. Does not recall events leading up to her coming to hospital via EMS. Able to recall most of her medications. Says she has not been taking her lamotrigine for bipolar for the last 10 days or so. Otherwise has been taking her medications as prescribed. Says she takes coumadin alternating days 7.5mg , 10mg  .  INR 1.1      Has some reproducible chest pain left sided. No shortness of breath, no palpitations, no dizziness, no HA, nausea, vomit, fevers or chills. No focal deficits.      Per nursing patient has been pulling off her blood pressure cuff and tele lines.         ER Course:   Code stroke called for encephalopathy     Vital signs showed elevated blood pressure 170/60, HR 58, 99% room air, afebrile. RR 11.   CBC, CMP unremarkable.   INR 1.1  UA unremarkable  UDS positive for THC.      CT head unremarkable  CTA head and neck pending.      CT Head 05/04/2022:  IMPRESSION:     No acute ischemic change or hemorrhage.  The brain looks normal for age.      Review of Systems:  A comprehensive review of systems was negative except for that written in the History of Present Illness.  CONSTITUTIONAL:  Denies weight loss, fever, chills, weakness or fatigue.  HEENT:  Denies acute vision changes, No hearing loss or tinnitus, no nasal congestion or throat pain.  SKIN: No rash or itching.  CARDIOVASCULAR: Denies chest pain, chest pressure or chest discomfort. No palpitations or edema.  RESPIRATORY: denies shortness of breath, cough or sputum.  GASTROINTESTINAL:  denies pain, distension, nausea, diarrhea  GENITOURINARY: denies dysuria, bleeding and incontinence.  MUSCULOSKELETAL: chest wall pain  NEUROLOGICAL: denies focal deficits  PSYCHIATRIC: Denies active changes in mentation, normal mood.      Objective:     Vitals:    05/08/22 0015 05/08/22 0415 05/08/22 0534 05/08/22 0754   BP: 103/64 98/62     Pulse: 67 66  67   Resp: 18 19  18    Temp: 97.9 F (36.6 C) 98.3 F (36.8 C)     TempSrc: Oral Oral     SpO2: 95% 95%  98%   Weight:   73 kg (161 lb)    Height:            Intake and Output:  Current Shift: No intake/output data recorded.   Last three shifts: 12/22 1901 - 12/24 0700  In: 120 [P.O.:120]  Out: -             Physical Exam:   General: Well appearing, NAD  Skin: No rash, no lesions  HEENT: Normal appearing oropharynx; no LAD, no lesions; EOMI  Head: Normocephalic, atraumatic  Cardiovascular: regular rate and rhythm, no obvious murmurs noted, no edema  Pulmonary: CTA bilaterally, no obvious wheezing, rales, or rhonchi  Abdomen: Bowel sounds normal, soft, non distended, non tender, no rebound or guarding.   MSK:  No deformities, spine non tender; reproducible left chest wall tenderness.   Neuro: CN 2-12 intact, no focal deficits.   Psychiatric: normal mood, appropriate behavior, Alert and oriented x 3    Lab/Data Review:     Latest Reference Range & Units 05/08/22 04:10   Sodium 136 - 145 mmol/L 139   Potassium 3.5 - 5.5 mmol/L 3.7   Chloride 100 - 111 mmol/L 106   CO2 21 - 32 mmol/L 29   BUN,BUNPL 7.0 - 18 MG/DL 19 (H)   Creatinine 0.6 - 1.3 MG/DL 05/10/22   Bun/Cre Ratio 12 - 20   20   Anion Gap 3.0 - 18 mmol/L  4   Est, Glom Filt Rate >60 ml/min/1.85m2 >60   Magnesium 1.6 - 2.6 mg/dL 2.0   Glucose, Random 74 - 99 mg/dL 89   CALCIUM, SERUM, 75m 8.5 - 10.1 MG/DL 8.8   WBC 4.6 - 562130 K/uL 6.5   RBC 4.20 - 5.30 M/uL 4.47   Hemoglobin Quant 12.0 - 16.0 g/dL 86.5   Hematocrit 78.4 - 45.0 % 41.1   MCV 78.0 - 100.0 FL 91.9   MCH 24.0 - 34.0 PG 29.8   MCHC 31.0 - 37.0 g/dL 69.6   MPV 9.2 - 29.5 FL 10.7   RDW 11.6 - 14.5 % 12.7   Platelet Count 135 - 420 K/uL 189   Neutrophils % 40 - 73 % 32 (L)   Lymphocyte % 21 - 52 % 57 (H)   Monocytes % 3 - 10 % 7   Eosinophils % 0 - 5 % 3   Basophils % 0 - 2 % 1   Neutrophils Absolute 1.8 - 8.0 K/UL 2.1   Lymphocytes Absolute 0.9 - 3.6 K/UL 3.7 (H)   Monocytes Absolute 0.05 - 1.2 K/UL 0.5   Eosinophils Absolute 0.0 - 0.4 K/UL 0.2   Basophils Absolute 0.0 - 0.1 K/UL 0.1   Differential Type -   AUTOMATED   Immature Granulocytes 0.0 - 0.5 % 0   Nucleated Red Blood Cells 0 PER 100 WBC  0.00 - 0.01 K/uL 0.0  0.00   Absolute Immature Granulocyte 0.00 - 0.04 K/UL 0.0  Prothrombin Time 11.9 - 14.7 sec 20.9 (H)   INR 0.9 - 1.1   1.8 (H)   (H): Data is abnormally high  (L): Data is abnormally low        Assessment:     Principal Problem:    Encephalopathy  Active Problems:    Acute encephalopathy  Resolved Problems:    * No resolved hospital problems. *       Plan:     Acute Encephalopathy  Unclear etiology. Was code stroke in ER. CT head negative  Possible toxic etiology, acute psychotic episode, hx of bipolar 1. Missed 10 days of lamictal.   UA negative, urine cultures pending  UDS positive for [facility]  Tele neuro consulted in ER. Patietn became more responsive to painful stimuli.   This morning, symptomatically improved. No focal deficits  Consult Neurology  Check lamictal levels. Resume home meds  Continue aspirin, statin  PT, OT  Speech eval/swallow evaluation; passed bedside swallow per nursing  Follow up EEG; results not released  MRI no acute findings  CTA head and neck no acute findings, patent  circulation     Hx of Bioprosthetic mitral valve replacement / hx of Left atrial appendage ligation 2017 / pAFIB / HFpEF / HTN / Moderate MR, moderate aortic regurge   Goal INR 2.5-3.5   Primary Cardiologist Dr. Theodoro Parma  Echo; normal EF; essentially unchanged from 07/2021. Needs outpatient follow up with Dr. Theodoro Parma  Lasix 40mg  PO daily  Metoprolol succinate 50mg  daily  Aspirin, statin  Warfarin dosing per pharmacy.   Daily INR.      Abdominal Pain / Emesis:  Vomit with swallow eval.   Epigastric, left sided abdominal pain.   Dc pepcid start protonix.   CT abdomen unremarkable     COPD:  Duonebs PRN  brovana     Tobacco Use   1/2 PPD  Nicotine patch     HLD  Pravastatin     Bipolar/Anxiety/PTSD  Lamictal 25mg  q8 hours; had missed 10 days of meds prior to admission  Celexa 20mg  daily  Ativan PRN     Marijuana Use:  THC positive UDS. Acknowledgues use. Denies other substance abuse    GI PPX: lovenox  DVT ppx:  Protonix    Signed By: Natale Milch, MD     May 08, 2022

## 2022-05-08 NOTE — Care Coordination-Inpatient (Signed)
Dr Jacqulyn Liner let CM know that patient is ready for discharge earlier.       CM spoke with [facility] owner Algis Greenhouse with [facility] [phone], updated that Doctor said patient is medically ready for discharge.   Ms Billups asked about Psych Consult, CM let Ms Billups know that no Psych consult was ordered, and patient's Doctor said patient is medically ready for discharge.   Ms. Shawna Orleans said she will be at the hospital in 2 hours to transport patient home today for discharge.       CM Perfect Served Dr. Jacqulyn Liner and updated that patient can discharge today, and [facility] owner will be here in 2 hours to transport patient home today.       CM spoke with Malachy Mood RN and updated, that patient is discharging today, and [facility] owner will be here in 2 hours to transport patient home today for discharge.           Nicanor Alcon, RN  Case Management [phone]

## 2022-05-08 NOTE — Care Coordination-Inpatient (Signed)
Home Health order noted.       CM spoke with [facility] owner Algis Greenhouse [phone], let her know that [facility] ordered for PT and OT.   Ms. Shawna Orleans gave [facility] verbal consent for [facility].       CM spoke with Tammy with [facility], said they can take patient.   CM put patient in the queue for [facility]             Nicanor Alcon, RN  Case Management [phone]

## 2022-05-08 NOTE — Progress Notes (Signed)
Physician Progress Note      PATIENT:               Mary [name], Mary [name]  CSN #:                  [id number]  DOB:                       29-Mar-1966  ADMIT DATE:       05/04/2022 1:47 PM  [place] DATE:        05/08/2022 3:15 PM  RESPONDING  PROVIDER #:        Natale Milch MD          QUERY TEXT:    Dear  DR Jacqulyn Liner  or  Dr Tyler Pita    Pt admitted with  AMS and has acute encephalopathy documented. If possible,   please document in progress notes and discharge summary further specificity   regarding the type of encephalopathy:      The medical record reflects the following:  Risk Factors: hx of bipolar 1,lamictal.  Missed 10 days of lamictal  Clinical Indicators:  H&P 12/21"Acute Encephalopathy,Unclear etiology. Was   code stroke in ER. CT head negative,CTA head and neck pending  Possible toxic etiology, acute psychotic episode, hx of bipolar .UA negative,   urine cultures pending  UDS positive for THC"  Treatment: aspirin, statin,neuro consult IVF    Thank you,   Merry Christmas  Theodoro Parma  RN  CCDS  Options provided:  -- Metabolic encephalopathy  -- Toxic encephalopathy  -- Other - I will add my own diagnosis  -- Disagree - Not applicable / Not valid  -- Disagree - Clinically unable to determine / Unknown  -- Refer to Clinical Documentation Reviewer    PROVIDER RESPONSE TEXT:    Provider is clinically unable to determine a response to this query.  toxic encephalopathy vs acute psychosis from uncontrolled bipolar.    Query created by: Jefm Petty on 05/06/2022 2:23 PM      Electronically signed by:  Natale Milch MD 05/10/2022 3:31 PM

## 2022-05-08 NOTE — Plan of Care (Signed)
Problem: Discharge Planning  Goal: Discharge to home or other facility with appropriate resources  Outcome: Adequate for Discharge     Problem: Chronic Conditions and Co-morbidities  Goal: Patient's chronic conditions and co-morbidity symptoms are monitored and maintained or improved  Outcome: Adequate for Discharge     Problem: Confusion  Goal: Confusion, delirium, dementia, or psychosis is improved or at baseline  Description: INTERVENTIONS:  1. Assess for possible contributors to thought disturbance, including medications, impaired vision or hearing, underlying metabolic abnormalities, dehydration, psychiatric diagnoses, and notify attending LIP  2. Institute high risk fall precautions, as indicated  3. Provide frequent short contacts to provide reality reorientation, refocusing and direction  4. Decrease environmental stimuli, including noise as appropriate  5. Monitor and intervene to maintain adequate nutrition, hydration, elimination, sleep and activity  6. If unable to ensure safety without constant attention obtain sitter and review sitter guidelines with assigned personnel  7. Initiate Psychosocial CNS and Spiritual Care consult, as indicated  Outcome: Adequate for Discharge     Problem: Safety - Adult  Goal: Free from fall injury  Outcome: Adequate for Discharge

## 2022-05-08 NOTE — Care Coordination-Inpatient (Signed)
Discharge order noted for today. Pt has been accepted to [facility] agency. Met with patient and spoke with [facility] and are agreeable to the transition plan today. Transport has been arranged through [employer]. Patient's home health  orders have been forwarded to [facility] home health  agency via [place]. Updated bedside RN, Malachy Mood,  to the transition plan.  Discharge information has been documented on the AVS.         Nicanor Alcon, RN  Case Management [phone]

## 2022-05-08 NOTE — Discharge Instructions (Addendum)
Follow up with PCP Dr. Kenna Gilbert, Dr. Barrington Ellison, or Dr. Sebastian Ache within 1 week of discharge.     Discharge medications reviewed with the patient and appropriate educational materials and side effects teaching were provided.     Patient armband removed and shredded     Thank you for enrolling in MyChart. Please follow the instructions below to securely access your online medical record. MyChart allows you to send messages to your doctor, view your test results, renew your prescriptions, schedule appointments, and more.     How Do I Sign Up?  In your Internet browser, go to [email or web address]  Click on the Sign Up Now link in the Sign In box. You will see the New Member Sign Up page.  Enter your MyChart Access Code exactly as it appears below. You will not need to use this code after you've completed the sign-up process. If you do not sign up before the expiration date, you must request a new code.  MyChart Access Code: Activation code not generated  Current MyChart Status: Active    Enter your Social Security Number ([id number]) and Date of Birth (mm/dd/yyyy) as indicated and click Submit. You will be taken to the next sign-up page.  Create a MyChart ID. This will be your MyChart login ID and cannot be changed, so think of one that is secure and easy to remember.  Create a Clinical biochemist. You can change your password at any time.  Enter your Password Reset Question and Answer. This can be used at a later time if you forget your password.   Enter your e-mail address. You will receive e-mail notification when new information is available in MyChart.  Click Sign Up. You can now view your medical record.     Additional Information  If you have questions, please contact your physician practice where you receive care. Remember, MyChart is NOT to be used for urgent needs. For medical emergencies, dial 911.     DISCHARGE SUMMARY from Nurse    PATIENT INSTRUCTIONS:    After general  anesthesia or intravenous sedation, for 24 hours or while taking prescription Narcotics:  Limit your activities  Do not drive and operate hazardous machinery  Do not make important personal or business decisions  Do  not drink alcoholic beverages  If you have not urinated within 8 hours after discharge, please contact your surgeon on call.    Report the following to your surgeon:  Excessive pain, swelling, redness or odor of or around the surgical area  Temperature over 100.5  Nausea and vomiting lasting longer than 4 hours or if unable to take medications  Any signs of decreased circulation or nerve impairment to extremity: change in color, persistent  numbness, tingling, coldness or increase pain  Any questions    What to do at Home:  Recommended activity: activity as tolerated    If you experience any of the following symptoms increasing weakness, nausea/vomiting please follow up with pcp or ED.    *  Please give a list of your current medications to your Primary Care Provider.    *  Please update this list whenever your medications are discontinued, doses are      changed, or new medications (including over-the-counter products) are added.    *  Please carry medication information at all times in case of emergency situations.    These are general instructions for a healthy lifestyle:    No smoking/ No tobacco products/ Avoid exposure to second hand smoke  Surgeon General's Warning:  Quitting smoking now greatly reduces serious risk to your health.    Obesity, smoking, and sedentary lifestyle greatly increases your risk for illness    A healthy diet, regular physical exercise & weight monitoring are important for maintaining a healthy lifestyle    You may be retaining fluid if you have a history of heart failure or if you experience any of the following symptoms:  Weight gain of 3 pounds or more overnight or 5 pounds in a week, increased swelling in our hands or feet or shortness of breath while lying flat in  bed.  Please call your doctor as soon as you notice any of these symptoms; do not wait until your next office visit.        The discharge information has been reviewed with the patient.  The patient verbalized understanding.  Discharge medications reviewed with the patient and appropriate educational materials and side effects teaching were provided.  ___________________________________________________________________________________________________________________________________

## 2022-05-10 ENCOUNTER — Encounter: Payer: PRIVATE HEALTH INSURANCE | Primary: Family Medicine

## 2022-05-10 NOTE — Case Communication (Signed)
PT phone call to patient then to CG.  LM for patient.  Spoke with CG, [place],Yolanda Passenger transport manager, who states, "She does not need that."  When asked about the deconditioning reported, she states, "That was a behavionr mechanism."  She reports the patient is not having any dfficulty with her mobility at home.  PT admission declined.  Patient is not admitted to Home Care.  Thank you for your referral.

## 2022-05-13 LAB — PROTIME-INR: INR: 3.4

## 2022-05-13 NOTE — Patient Instructions (Signed)
[name] is here today for anticoagulation monitoring for the diagnosis of mechanical mitral valve.  Her INR goal is 2.5-3.5 and her current Coumadin dose is 10 mg tue and sat; 7.5 mg all other days.     Today's findings include an INR of 3.4     Considering Mary [name] past history, todays findings, and per the coumadin policy/protocol, Mary [name] was instructed to take Coumadin as follows,  10 mg tue and sat; 7.5 mg all other days.  She was also instructed to come back in 1 weeks for an INR check.    A full discussion of the nature of anticoagulants has been carried out.  A full discussion of the need for frequent and regular monitoring, precise dosage adjustment and compliance was stressed.  Side effects of potential bleeding were discussed and Mary [name] was instructed to call [phone] if there are any signs of abnormal bleeding.  Mary [name] was instructed to avoid any OTC items containing aspirin or ibuprofen and prior to starting any new OTC products to consult with her physician or pharmacist to ensure no drug interactions are present.  Mary [name] was instructed to avoid any major changes in her general diet and to avoid alcohol consumption.  .      Mary [name] verbalized her understanding of all instructions and will call the office with any questions, concerns, or signs of abnormal bleeding or blood clot.

## 2022-05-15 ENCOUNTER — Inpatient Hospital Stay
Admit: 2022-05-15 | Discharge: 2022-05-23 | Disposition: A | Discharge status: Nursing Home (Includes ICF) | Payer: PRIVATE HEALTH INSURANCE | Admitting: Psychiatry

## 2022-05-15 ENCOUNTER — Inpatient Hospital Stay
Admit: 2022-05-15 | Discharge: 2022-05-15 | Disposition: A | Discharge status: Home / Self Care | Payer: PRIVATE HEALTH INSURANCE

## 2022-05-15 DIAGNOSIS — M546 Pain in thoracic spine: Secondary | ICD-10-CM

## 2022-05-15 DIAGNOSIS — R45851 Suicidal ideations: Secondary | ICD-10-CM

## 2022-05-15 DIAGNOSIS — G8929 Other chronic pain: Secondary | ICD-10-CM

## 2022-05-15 DIAGNOSIS — M79671 Pain in right foot: Secondary | ICD-10-CM

## 2022-05-15 DIAGNOSIS — F310 Bipolar disorder, current episode hypomanic: Secondary | ICD-10-CM

## 2022-05-15 LAB — URINALYSIS
Bilirubin Urine: NEGATIVE
Blood, Urine: NEGATIVE
Glucose, UA: NEGATIVE mg/dL
Ketones, Urine: 40 mg/dL — AB
Leukocyte Esterase, Urine: NEGATIVE
Nitrite, Urine: NEGATIVE
Protein, UA: 100 mg/dL — AB
Specific Gravity, UA: 1.016 (ref 1.005–1.030)
Urobilinogen, Urine: 1 EU/dL (ref 0.2–1.0)
pH, Urine: 5.5 (ref 5.0–8.0)

## 2022-05-15 LAB — COMPREHENSIVE METABOLIC PANEL
ALT: 25 U/L (ref 13–56)
ALT: 29 U/L (ref 13–56)
AST: 23 U/L (ref 10–38)
AST: 28 U/L (ref 10–38)
Albumin/Globulin Ratio: 1.2 (ref 0.8–1.7)
Albumin/Globulin Ratio: 1.3 (ref 0.8–1.7)
Albumin: 3.4 g/dL (ref 3.4–5.0)
Albumin: 3.9 g/dL (ref 3.4–5.0)
Alk Phosphatase: 50 U/L (ref 45–117)
Alk Phosphatase: 56 U/L (ref 45–117)
Anion Gap: 4 mmol/L (ref 3.0–18)
Anion Gap: 8 mmol/L (ref 3.0–18)
BUN/Creatinine Ratio: 11 — ABNORMAL LOW (ref 12–20)
BUN/Creatinine Ratio: 10 — ABNORMAL LOW (ref 12–20)
BUN: 11 MG/DL (ref 7.0–18)
BUN: 9 MG/DL (ref 7.0–18)
CO2: 26 mmol/L (ref 21–32)
CO2: 28 mmol/L (ref 21–32)
Calcium: 8.9 MG/DL (ref 8.5–10.1)
Calcium: 9.4 MG/DL (ref 8.5–10.1)
Chloride: 108 mmol/L (ref 100–111)
Chloride: 111 mmol/L (ref 100–111)
Creatinine: 0.88 MG/DL (ref 0.6–1.3)
Creatinine: 0.96 MG/DL (ref 0.6–1.3)
Est, Glom Filt Rate: >60 mL/min/{1.73_m2} (ref >60)
Est, Glom Filt Rate: >60 mL/min/{1.73_m2} (ref >60)
Globulin: 2.8 g/dL (ref 2.0–4.0)
Globulin: 3 g/dL (ref 2.0–4.0)
Glucose: 71 mg/dL — ABNORMAL LOW (ref 74–99)
Glucose: 72 mg/dL — ABNORMAL LOW (ref 74–99)
Potassium: 3.2 mmol/L — ABNORMAL LOW (ref 3.5–5.5)
Potassium: 3.6 mmol/L (ref 3.5–5.5)
Sodium: 142 mmol/L (ref 136–145)
Sodium: 143 mmol/L (ref 136–145)
Total Bilirubin: 0.9 MG/DL (ref 0.2–1.0)
Total Bilirubin: 0.9 MG/DL (ref 0.2–1.0)
Total Protein: 6.2 g/dL — ABNORMAL LOW (ref 6.4–8.2)
Total Protein: 6.9 g/dL (ref 6.4–8.2)

## 2022-05-15 LAB — CBC WITH AUTO DIFFERENTIAL
Basophils %: 1 % (ref 0–2)
Basophils %: 1 % (ref 0–2)
Basophils Absolute: 0.1 10*3/uL (ref 0.0–0.1)
Basophils Absolute: 0 10*3/uL (ref 0.0–0.1)
Eosinophils %: 1 % (ref 0–5)
Eosinophils %: 2 % (ref 0–5)
Eosinophils Absolute: 0.1 10*3/uL (ref 0.0–0.4)
Eosinophils Absolute: 0.1 10*3/uL (ref 0.0–0.4)
Hematocrit: 43.6 % (ref 35.0–45.0)
Hematocrit: 38.9 % (ref 35.0–45.0)
Hemoglobin: 14.2 g/dL (ref 12.0–16.0)
Hemoglobin: 12.9 g/dL (ref 12.0–16.0)
Immature Granulocytes %: 0 % (ref 0.0–0.5)
Immature Granulocytes %: 0 % (ref 0.0–0.5)
Immature Granulocytes Absolute: 0 10*3/uL (ref 0.00–0.04)
Immature Granulocytes Absolute: 0 10*3/uL (ref 0.00–0.04)
Lymphocytes %: 20 % — ABNORMAL LOW (ref 21–52)
Lymphocytes %: 44 % (ref 21–52)
Lymphocytes Absolute: 2 10*3/uL (ref 0.9–3.6)
Lymphocytes Absolute: 2.9 10*3/uL (ref 0.9–3.6)
MCH: 30.3 PG (ref 24.0–34.0)
MCH: 30.5 PG (ref 24.0–34.0)
MCHC: 32.6 g/dL (ref 31.0–37.0)
MCHC: 33.2 g/dL (ref 31.0–37.0)
MCV: 93 FL (ref 78.0–100.0)
MCV: 92 FL (ref 78.0–100.0)
MPV: 10.7 FL (ref 9.2–11.8)
MPV: 10.3 FL (ref 9.2–11.8)
Monocytes %: 5 % (ref 3–10)
Monocytes %: 6 % (ref 3–10)
Monocytes Absolute: 0.5 10*3/uL (ref 0.05–1.2)
Monocytes Absolute: 0.4 10*3/uL (ref 0.05–1.2)
Neutrophils %: 74 % — ABNORMAL HIGH (ref 40–73)
Neutrophils %: 48 % (ref 40–73)
Neutrophils Absolute: 7.5 10*3/uL (ref 1.8–8.0)
Neutrophils Absolute: 3.1 10*3/uL (ref 1.8–8.0)
Nucleated RBCs: 0 PER 100 WBC
Nucleated RBCs: 0 PER 100 WBC
Platelets: 233 10*3/uL (ref 135–420)
Platelets: 229 10*3/uL (ref 135–420)
RBC: 4.69 M/uL (ref 4.20–5.30)
RBC: 4.23 M/uL (ref 4.20–5.30)
RDW: 13.1 % (ref 11.6–14.5)
RDW: 13 % (ref 11.6–14.5)
WBC: 10.2 10*3/uL (ref 4.6–13.2)
WBC: 6.6 10*3/uL (ref 4.6–13.2)
nRBC: 0 10*3/uL (ref 0.00–0.01)
nRBC: 0 10*3/uL (ref 0.00–0.01)

## 2022-05-15 LAB — EKG 12-LEAD
Atrial Rate: 52 {beats}/min
P Axis: 91 degrees
P-R Interval: 150 ms
Q-T Interval: 450 ms
QRS Duration: 92 ms
QTc Calculation (Bazett): 418 ms
R Axis: 37 degrees
T Axis: 48 degrees
Ventricular Rate: 52 {beats}/min

## 2022-05-15 LAB — URINE DRUG SCREEN
Amphetamine, Urine: NEGATIVE
Barbiturates, Urine: NEGATIVE
Benzodiazepines, Urine: NEGATIVE
Cocaine, Urine: POSITIVE — AB
Methadone, Urine: NEGATIVE
Opiates, Urine: NEGATIVE
Phencyclidine, Urine: NEGATIVE
THC, TH-Cannabinol, Urine: POSITIVE — AB

## 2022-05-15 LAB — URINALYSIS, MICRO
RBC, UA: 0 /hpf (ref 0–5)
WBC, UA: 0 /hpf (ref 0–4)

## 2022-05-15 LAB — ETHANOL
Ethanol Lvl: <3 MG/DL (ref 0–3)
Ethanol Lvl: <3 MG/DL (ref 0–3)

## 2022-05-15 LAB — LACTIC ACID: Lactic Acid, Plasma: 0.8 MMOL/L (ref 0.4–2.0)

## 2022-05-15 MED ORDER — SODIUM CHLORIDE 0.9 % IV BOLUS
0.9 % | Freq: Once | INTRAVENOUS | Status: AC
Start: 2022-05-15 — End: 2022-05-15
  Administered 2022-05-15: 13:00:00 1000 mL via INTRAVENOUS

## 2022-05-15 MED ORDER — POTASSIUM BICARBONATE 25 MEQ PO TBEF
25 MEQ | Freq: Once | ORAL | Status: AC
Start: 2022-05-15 — End: 2022-05-15
  Administered 2022-05-15: 15:00:00 50 meq via ORAL

## 2022-05-15 MED ORDER — POTASSIUM CHLORIDE CRYS ER 20 MEQ PO TBCR
20 MEQ | ORAL | Status: DC
Start: 2022-05-15 — End: 2022-05-15

## 2022-05-15 MED FILL — SODIUM CHLORIDE 0.9 % IV SOLN: 0.9 % | INTRAVENOUS | Qty: 1000

## 2022-05-15 MED FILL — EFFER-K 25 MEQ PO TBEF: 25 MEQ | ORAL | Qty: 2

## 2022-05-15 NOTE — Progress Notes (Signed)
9:25 AM : Pt care transferred to me from Dr. Quay Burow  ,ED provider. History of patient complaint(s), available diagnostic reports and current treatment plan has been discussed thoroughly.   Bedside rounding on patient occured : No .  Intended disposition of patient : TBD  Pending diagnostics reports and/or labs (please list):   ED Course as of 05/16/22 2017   Mon May 16, 2022   0715 SI, homeless, medically cleared for placement [JM]   0926 Ordered oral Geodon for anxiety, agitation [JM]   1436 Should get IP BH bed assigned here  [JM]      ED Course User Index  [JM] Alveria Apley, DO       Alveria Apley, DO  Emergency Physician  [facility]

## 2022-05-15 NOTE — ED Triage Notes (Addendum)
Pt arrived in ER complaining of suicidal ideation. Pt states she is going to walk in front of traffic or taking all of her medication and slitting her throat.
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
## 2022-05-15 NOTE — ED Notes (Signed)
Verbal shift change report given to Kristen (oncoming nurse) by Lashawna Poche (offgoing nurse). Report included the following information ED Encounter Summary, ED SBAR, and MAR.

## 2022-05-15 NOTE — ED Triage Notes (Signed)
Patient comes in stating that she has frostbite to her bilateral feet. Patient states that she is so cold and she has been outside since earlier today. Patient states that she tried to move but it was a bad, bad mistake. Unable to obtain oral temperature in triage.

## 2022-05-15 NOTE — ED Notes (Signed)
Patient lactic resulted at 0700. Lactic 0.8 and WNL. No repeat needed at this time per MD.

## 2022-05-15 NOTE — ED Notes (Signed)
Pt belongings placed in locker 2 A.
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
## 2022-05-15 NOTE — ED Provider Notes (Cosign Needed)
EMERGENCY DEPARTMENT HISTORY AND PHYSICAL EXAM    Date: 05/15/2022  Patient Name: Mary [name]    History of Presenting Illness     Chief Complaint   Patient presents with    Suicidal         History Provided By patient     Chief Complaint: SI   Duration: several days   Timing:  acute  Location: n/a  Quality: n/a  Severity: severe  Modifying Factors: none   Associated Symptoms: n/a      Additional History (Context): Mary [name] is a 56 y.o. female with PMH anxiety bipolar disorder CHF COPD GERD mitral regurgitation mitral stenosis and PTSD who presents with complaints of continued suicidal thoughts without plans at this time.  Patient was seen in the ED and discharged earlier today.  She continues to express suicidal ideations.  Patient is reportedly homeless.  No other complaints are reported at this time    PCP: Antionette Poles, MD    No current facility-administered medications for this encounter.     Current Outpatient Medications   Medication Sig Dispense Refill    lamoTRIgine (LAMICTAL) 25 MG tablet Take 1 tablet by mouth in the morning, at noon, and at bedtime 30 tablet 3    empagliflozin (JARDIANCE) 10 MG tablet Take 1 tablet by mouth daily 30 tablet 3    nicotine (NICODERM CQ) 14 MG/24HR Place 1 patch onto the skin daily 30 patch 3    omeprazole (PRILOSEC) 40 MG delayed release capsule Take 1 capsule by mouth daily 30 capsule 1    pravastatin (PRAVACHOL) 40 MG tablet Take 1 tablet by mouth daily 90 tablet 2    warfarin (COUMADIN) 2.5 MG tablet Take 1 tablet by mouth daily 90 tablet 2    TRELEGY ELLIPTA 100-62.5-25 MCG/ACT AEPB inhaler inhale 1 puff by mouth and INTO THE LUNGS once daily      pregabalin (LYRICA) 200 MG capsule Take 1 capsule by mouth 3 times daily for 180 days. 90 capsule 5    baclofen (LIORESAL) 10 MG tablet Take 0.5-1 tablets by mouth 3 times daily as needed (pain/spasm) 60 tablet 0    Ubrogepant (UBRELVY) 100 MG TABS Take 100 mg by mouth as needed (Migraine) Take 100 mg at onset  of headache; can repeat in 2 hours if no change; not to be taken more than 2 days a week. 10 tablet 3    metoprolol succinate (TOPROL XL) 50 MG extended release tablet Take 1 tablet by mouth daily 90 tablet 3    Acetaminophen (TYLENOL ARTHRITIS PAIN PO) Take by mouth      citalopram (CELEXA) 20 MG tablet Take 1 tablet by mouth daily 30 tablet 0    vitamin B-12 (CYANOCOBALAMIN) 1000 MCG tablet Take 1 tablet by mouth daily      albuterol sulfate HFA (PROVENTIL;VENTOLIN;PROAIR) 108 (90 Base) MCG/ACT inhaler Inhale 2 puffs into the lungs every 6 hours as needed      aspirin 81 MG chewable tablet Take 1 tablet by mouth daily      cetirizine (ZYRTEC) 10 MG tablet Take 1 tablet by mouth daily      docusate (COLACE, DULCOLAX) 100 MG CAPS Col-Rite 100 mg capsule   take 1 capsule by mouth twice a day if needed for constipation      fluticasone (FLONASE) 50 MCG/ACT nasal spray 1 spray by nasal route daily      furosemide (LASIX) 40 MG tablet Take 1 tablet by mouth daily as needed  Past History     Past Medical History:  Past Medical History:   Diagnosis Date    Anxiety     Bipolar 1 disorder (HCC)     CHF (congestive heart failure) (HCC)     COPD (chronic obstructive pulmonary disease) (HCC)     DDD (degenerative disc disease), cervical     GERD (gastroesophageal reflux disease)     H/O aortic valve replacement     History of transient ischemic attack (TIA)     Mitral regurgitation and mitral stenosis     PTSD (post-traumatic stress disorder)     Scoliosis     Vitamin D deficiency        Past Surgical History:  Past Surgical History:   Procedure Laterality Date    CARDIAC CATHETERIZATION      CARDIAC VALVE SURGERY      mitral valve replacement    CESAREAN SECTION      HAND/FINGER SURGERY UNLISTED Right     I&D right middle finger    HYSTERECTOMY (CERVIX STATUS UNKNOWN)      IR KYPHOPLASTY THORACIC FIRST LEVEL  03/03/2022    IR KYPHOPLASTY THORACIC FIRST LEVEL 03/03/2022 Samoilov, Dmitri E, MD [facility] RAD ANGIO IR    KNEE  SURGERY      OVARY REMOVAL         Family History:  Family History   Problem Relation Age of Onset    No Known Problems Mother        Social History:  Social History     Tobacco Use    Smoking status: Every Day     Current packs/day: 0.75     Types: Cigarettes     Passive exposure: Current    Smokeless tobacco: Never   Vaping Use    Vaping Use: Never used   Substance Use Topics    Alcohol use: Never    Drug use: Yes     Types: Marijuana Sheran Fava)       Allergies:  Allergies   Allergen Reactions    Lithium Nausea And Vomiting     Vomiting      Alprazolam Other (See Comments)     Gets suicidal    Dermatitis Antigen      Rash and blisers  Rash and blisers    Diazepam Other (See Comments)     Gets suicidal    Levofloxacin Other (See Comments)     Muscle stiffness and bulging muscles     Tobacco Other (See Comments)     Increasing lung deterioration     Adhesive Tape Rash     blisters         Review of Systems   Review of Systems   Constitutional:  Negative for chills and fever.   HENT:  Negative for congestion, rhinorrhea and sore throat.    Eyes:  Negative for discharge and redness.   Respiratory:  Negative for cough, shortness of breath, wheezing and stridor.    Cardiovascular:  Negative for chest pain.   Gastrointestinal:  Negative for abdominal pain, nausea and vomiting.   Genitourinary:  Negative for dysuria and frequency.   Musculoskeletal:  Negative for back pain and neck pain.   Skin:  Negative for rash and wound.   Neurological:  Negative for seizures, syncope and headaches.   Psychiatric/Behavioral:  Positive for suicidal ideas.    All other systems reviewed and are negative.    All Other Systems Negative  Physical Exam     Vitals:  05/15/22 1618   BP: (!) 152/102   Pulse: 77   Resp: 16   Temp: 97.6 F (36.4 C)   TempSrc: Oral   SpO2: 94%     Physical Exam  Vitals and nursing note reviewed.   Constitutional:       General: She is not in acute distress.     Appearance: Normal appearance. She is not  ill-appearing, toxic-appearing or diaphoretic.   HENT:      Head: Normocephalic and atraumatic.      Mouth/Throat:      Mouth: Mucous membranes are moist.   Eyes:      General: No scleral icterus.        Right eye: No discharge.         Left eye: No discharge.      Conjunctiva/sclera: Conjunctivae normal.   Cardiovascular:      Rate and Rhythm: Normal rate and regular rhythm.      Heart sounds: No murmur heard.  Pulmonary:      Effort: Pulmonary effort is normal. No respiratory distress.      Breath sounds: Normal breath sounds. No stridor. No wheezing or rhonchi.   Musculoskeletal:         General: No deformity or signs of injury. Normal range of motion.      Cervical back: Normal range of motion and neck supple.   Skin:     General: Skin is warm and dry.      Findings: No rash.   Neurological:      General: No focal deficit present.      Mental Status: She is alert and oriented to person, place, and time. Mental status is at baseline.      Motor: No weakness.      Coordination: Coordination normal.      Comments: Gait is steady and patient exhibits no evidence of ataxia. Patient is able to ambulate without difficulty. No focal neurological deficit noted. No facial droop, slurred speech, or evidence of altered mentation noted on exam.     Psychiatric:      Comments: Depressed mood.  Admits to suicidal ideations without plans at this time                Diagnostic Study Results     Labs -     Recent Results (from the past 12 hour(s))   Urinalysis    Collection Time: 05/15/22  4:30 PM   Result Value Ref Range    Color, UA YELLOW      Appearance CLEAR      Specific Gravity, UA 1.016 1.005 - 1.030      pH, Urine 5.5 5.0 - 8.0      Protein, UA 100 (A) NEG mg/dL    Glucose, UA Negative NEG mg/dL    Ketones, Urine 40 (A) NEG mg/dL    Bilirubin Urine Negative NEG      Blood, Urine Negative NEG      Urobilinogen, Urine 1.0 0.2 - 1.0 EU/dL    Nitrite, Urine Negative NEG      Leukocyte Esterase, Urine Negative NEG     Urine  Drug Screen    Collection Time: 05/15/22  4:30 PM   Result Value Ref Range    Benzodiazepines, Urine Negative NEG      Barbiturates, Urine Negative NEG      THC, TH-Cannabinol, Urine Positive (A) NEG      Opiates, Urine Negative NEG      PCP,  Urine Negative NEG      Cocaine, Urine Positive (A) NEG      Amphetamine, Urine Negative NEG      Methadone, Urine Negative NEG      Comments: (NOTE)    Urinalysis, Micro    Collection Time: 05/15/22  4:30 PM   Result Value Ref Range    WBC, UA 0 to 3 0 - 4 /hpf    RBC, UA 0 to 1 0 - 5 /hpf    Epithelial Cells UA 2+ 0 - 5 /lpf    BACTERIA, URINE FEW (A) NEG /hpf   CBC with Auto Differential    Collection Time: 05/15/22  6:00 PM   Result Value Ref Range    WBC 6.6 4.6 - 13.2 K/uL    RBC 4.23 4.20 - 5.30 M/uL    Hemoglobin 12.9 12.0 - 16.0 g/dL    Hematocrit 16.1 09.6 - 45.0 %    MCV 92.0 78.0 - 100.0 FL    MCH 30.5 24.0 - 34.0 PG    MCHC 33.2 31.0 - 37.0 g/dL    RDW 04.5 40.9 - 81.1 %    Platelets 229 135 - 420 K/uL    MPV 10.3 9.2 - 11.8 FL    Nucleated RBCs 0.0 0 PER 100 WBC    nRBC 0.00 0.00 - 0.01 K/uL    Neutrophils % 48 40 - 73 %    Lymphocytes % 44 21 - 52 %    Monocytes % 6 3 - 10 %    Eosinophils % 2 0 - 5 %    Basophils % 1 0 - 2 %    Immature Granulocytes 0 0.0 - 0.5 %    Neutrophils Absolute 3.1 1.8 - 8.0 K/UL    Lymphocytes Absolute 2.9 0.9 - 3.6 K/UL    Monocytes Absolute 0.4 0.05 - 1.2 K/UL    Eosinophils Absolute 0.1 0.0 - 0.4 K/UL    Basophils Absolute 0.0 0.0 - 0.1 K/UL    Absolute Immature Granulocyte 0.0 0.00 - 0.04 K/UL    Differential Type AUTOMATED     Comprehensive Metabolic Panel    Collection Time: 05/15/22  6:00 PM   Result Value Ref Range    Sodium 143 136 - 145 mmol/L    Potassium 3.6 3.5 - 5.5 mmol/L    Chloride 111 100 - 111 mmol/L    CO2 28 21 - 32 mmol/L    Anion Gap 4 3.0 - 18 mmol/L    Glucose 71 (L) 74 - 99 mg/dL    BUN 9 7.0 - 18 MG/DL    Creatinine 9.14 0.6 - 1.3 MG/DL    Bun/Cre Ratio 10 (L) 12 - 20      Est, Glom Filt Rate >60 >60  ml/min/1.41m2    Calcium 8.9 8.5 - 10.1 MG/DL    Total Bilirubin 0.9 0.2 - 1.0 MG/DL    ALT 25 13 - 56 U/L    AST 23 10 - 38 U/L    Alk Phosphatase 50 45 - 117 U/L    Total Protein 6.2 (L) 6.4 - 8.2 g/dL    Albumin 3.4 3.4 - 5.0 g/dL    Globulin 2.8 2.0 - 4.0 g/dL    Albumin/Globulin Ratio 1.2 0.8 - 1.7     Ethanol    Collection Time: 05/15/22  6:00 PM   Result Value Ref Range    Ethanol Lvl <3 0 - 3 MG/DL   NWGNF-62 & Influenza Combo  Collection Time: 05/15/22  6:00 PM    Specimen: Nasopharyngeal   Result Value Ref Range    SARS-CoV-2, PCR Not detected NOTD      Rapid Influenza A By PCR Not detected NOTD      Rapid Influenza B By PCR Not detected NOTD     Protime-INR    Collection Time: 05/16/22  1:00 AM   Result Value Ref Range    Protime 29.2 (H) 11.9 - 14.7 sec    INR 2.7 (H) 0.9 - 1.1         Radiologic Studies -   No orders to display     @CT48 @  @CXR48 @      Medical Decision Making   I am the first provider for this patient.    I reviewed the vital signs, available nursing notes, past medical history, past surgical history, family history and social history.    Vital Signs-Reviewed the patient's vital signs.    Records Reviewed: Sabastion Hrdlicka, PA-C   Procedures:  Procedures    Provider Notes (Medical Decision Making): Impression:  SI, homelessness    Labs ordered for medical clearance, BSMART consult placed.     Glucose 71 ethanol negative CBC and remaining metabolic panel was otherwise unremarkable.  UDS was positive for cocaine and THC earlier today.    The patient is a medically cleared for crisis evaluation.  She was given a sandwich and juice in the ED.  The patient was seen and evaluated at bedside by the crisis worker.  Bed search is in process.Ercell Razon, PA-C     2:04 AM pt turned over to night ED attending Dr Lawerance Bach who will assume care of the pt at this time. Lexee Brashears, PA-C     Dictation disclaimer:  Please note that this dictation was completed with Dragon, the computer voice recognition  software.  Quite often unanticipated grammatical, syntax, homophones, and other interpretive errors are inadvertently transcribed by the computer software.  Please disregard these errors.  Please excuse any errors that have escaped final proofreading.      MED RECONCILIATION:  No current facility-administered medications for this encounter.     Current Outpatient Medications   Medication Sig    lamoTRIgine (LAMICTAL) 25 MG tablet Take 1 tablet by mouth in the morning, at noon, and at bedtime    empagliflozin (JARDIANCE) 10 MG tablet Take 1 tablet by mouth daily    nicotine (NICODERM CQ) 14 MG/24HR Place 1 patch onto the skin daily    omeprazole (PRILOSEC) 40 MG delayed release capsule Take 1 capsule by mouth daily    pravastatin (PRAVACHOL) 40 MG tablet Take 1 tablet by mouth daily    warfarin (COUMADIN) 2.5 MG tablet Take 1 tablet by mouth daily    TRELEGY ELLIPTA 100-62.5-25 MCG/ACT AEPB inhaler inhale 1 puff by mouth and INTO THE LUNGS once daily    pregabalin (LYRICA) 200 MG capsule Take 1 capsule by mouth 3 times daily for 180 days.    baclofen (LIORESAL) 10 MG tablet Take 0.5-1 tablets by mouth 3 times daily as needed (pain/spasm)    Ubrogepant (UBRELVY) 100 MG TABS Take 100 mg by mouth as needed (Migraine) Take 100 mg at onset of headache; can repeat in 2 hours if no change; not to be taken more than 2 days a week.    metoprolol succinate (TOPROL XL) 50 MG extended release tablet Take 1 tablet by mouth daily    Acetaminophen (TYLENOL ARTHRITIS PAIN PO) Take by mouth  citalopram (CELEXA) 20 MG tablet Take 1 tablet by mouth daily    vitamin B-12 (CYANOCOBALAMIN) 1000 MCG tablet Take 1 tablet by mouth daily    albuterol sulfate HFA (PROVENTIL;VENTOLIN;PROAIR) 108 (90 Base) MCG/ACT inhaler Inhale 2 puffs into the lungs every 6 hours as needed    aspirin 81 MG chewable tablet Take 1 tablet by mouth daily    cetirizine (ZYRTEC) 10 MG tablet Take 1 tablet by mouth daily    docusate (COLACE, DULCOLAX) 100 MG CAPS  Col-Rite 100 mg capsule   take 1 capsule by mouth twice a day if needed for constipation    fluticasone (FLONASE) 50 MCG/ACT nasal spray 1 spray by nasal route daily    furosemide (LASIX) 40 MG tablet Take 1 tablet by mouth daily as needed       Disposition:  TBD        Medication List        ASK your doctor about these medications      albuterol sulfate HFA 108 (90 Base) MCG/ACT inhaler  Commonly known as: PROVENTIL;VENTOLIN;PROAIR     aspirin 81 MG chewable tablet     baclofen 10 MG tablet  Commonly known as: LIORESAL  Take 0.5-1 tablets by mouth 3 times daily as needed (pain/spasm)     cetirizine 10 MG tablet  Commonly known as: ZYRTEC     citalopram 20 MG tablet  Commonly known as: CELEXA  Take 1 tablet by mouth daily     docusate 100 MG Caps  Commonly known as: COLACE, DULCOLAX     empagliflozin 10 MG tablet  Commonly known as: JARDIANCE  Take 1 tablet by mouth daily     fluticasone 50 MCG/ACT nasal spray  Commonly known as: FLONASE     furosemide 40 MG tablet  Commonly known as: LASIX     lamoTRIgine 25 MG tablet  Commonly known as: LAMICTAL  Take 1 tablet by mouth in the morning, at noon, and at bedtime     metoprolol succinate 50 MG extended release tablet  Commonly known as: TOPROL XL  Take 1 tablet by mouth daily     nicotine 14 MG/24HR  Commonly known as: NICODERM CQ  Place 1 patch onto the skin daily     omeprazole 40 MG delayed release capsule  Commonly known as: PRILOSEC  Take 1 capsule by mouth daily     pravastatin 40 MG tablet  Commonly known as: PRAVACHOL  Take 1 tablet by mouth daily     pregabalin 200 MG capsule  Commonly known as: LYRICA  Take 1 capsule by mouth 3 times daily for 180 days.     Trelegy Ellipta 100-62.5-25 MCG/ACT Aepb inhaler  Generic drug: fluticasone-umeclidin-vilant     TYLENOL ARTHRITIS PAIN PO     Ubrelvy 100 MG Tabs  Generic drug: Ubrogepant  Take 100 mg by mouth as needed (Migraine) Take 100 mg at onset of headache; can repeat in 2 hours if no change; not to be taken more  than 2 days a week.     vitamin B-12 1000 MCG tablet  Commonly known as: CYANOCOBALAMIN     warfarin 2.5 MG tablet  Commonly known as: COUMADIN  Take as directed. If you are unsure how to take this medication, talk to your nurse or doctor.  Original instructions: Take 1 tablet by mouth daily                Diagnosis     Clinical Impression:  1. Suicidal ideation    2. Homelessness                Norman, Ikhlas Albo J, [place]  05/16/22 0865

## 2022-05-15 NOTE — ED Provider Notes (Signed)
[facility] EMERGENCY DEPT  EMERGENCY DEPARTMENT ENCOUNTER    Patient Name: Mary [name]  MRN: [id number]  Birth Date: 06/21/1965  Provider: Sunnie Nielsen, MD  PCP: Vincenza Hews, MD   Time/Date of evaluation: 5:53 AM EST on 05/15/22    History of Presenting Illness     Chief Complaint   Patient presents with    Foot Pain       History Provided By: Patient     History Mahalia Longest):   [name] is a 56 y.o. female who presents to the emergency department with a complaint of frostbite on both feet she states she was cold been outside since earlier today she was trying to move houses but apparently the people she was with gotten big argument so she walked away from the situation.  Temperature 97.7 orally on arrival.  Feet cool to the touch.  No complaint of any other injuries or discomforts.    Nursing Notes were all reviewed and agreed with or any disagreements were addressed in the HPI.    Past History     Past Medical History:  Past Medical History:   Diagnosis Date    Anxiety     Bipolar 1 disorder (HCC)     CHF (congestive heart failure) (HCC)     COPD (chronic obstructive pulmonary disease) (HCC)     DDD (degenerative disc disease), cervical     GERD (gastroesophageal reflux disease)     H/O aortic valve replacement     History of transient ischemic attack (TIA)     Mitral regurgitation and mitral stenosis     PTSD (post-traumatic stress disorder)     Scoliosis     Vitamin D deficiency        Past Surgical History:  Past Surgical History:   Procedure Laterality Date    CARDIAC CATHETERIZATION      CARDIAC VALVE SURGERY      mitral valve replacement    CESAREAN SECTION      HAND/FINGER SURGERY UNLISTED Right     I&D right middle finger    HYSTERECTOMY (CERVIX STATUS UNKNOWN)      IR KYPHOPLASTY THORACIC FIRST LEVEL  03/03/2022    IR KYPHOPLASTY THORACIC FIRST LEVEL 03/03/2022 Samoilov, Dmitri E, MD [facility] RAD ANGIO IR    KNEE SURGERY      OVARY REMOVAL         Family History:  Family History   Problem  Relation Age of Onset    No Known Problems Mother        Social History:  Social History     Tobacco Use    Smoking status: Every Day     Current packs/day: 0.75     Types: Cigarettes     Passive exposure: Current    Smokeless tobacco: Never   Vaping Use    Vaping Use: Never used   Substance Use Topics    Alcohol use: Never    Drug use: Yes     Types: Marijuana Sherrie Mustache)       Medications:  Current Facility-Administered Medications   Medication Dose Route Frequency Provider Last Rate Last Admin    potassium bicarbonate (EFFER-K/K-LYTE) disintegrating tablet 50 mEq  50 mEq Oral Once Sunnie Nielsen, MD         Current Outpatient Medications   Medication Sig Dispense Refill    lamoTRIgine (LAMICTAL) 25 MG tablet Take 1 tablet by mouth in the morning, at noon, and at bedtime 30 tablet 3  empagliflozin (JARDIANCE) 10 MG tablet Take 1 tablet by mouth daily 30 tablet 3    nicotine (NICODERM CQ) 14 MG/24HR Place 1 patch onto the skin daily 30 patch 3    omeprazole (PRILOSEC) 40 MG delayed release capsule Take 1 capsule by mouth daily 30 capsule 1    pravastatin (PRAVACHOL) 40 MG tablet Take 1 tablet by mouth daily 90 tablet 2    warfarin (COUMADIN) 2.5 MG tablet Take 1 tablet by mouth daily 90 tablet 2    TRELEGY ELLIPTA 100-62.5-25 MCG/ACT AEPB inhaler inhale 1 puff by mouth and INTO THE LUNGS once daily      pregabalin (LYRICA) 200 MG capsule Take 1 capsule by mouth 3 times daily for 180 days. 90 capsule 5    baclofen (LIORESAL) 10 MG tablet Take 0.5-1 tablets by mouth 3 times daily as needed (pain/spasm) 60 tablet 0    Ubrogepant (UBRELVY) 100 MG TABS Take 100 mg by mouth as needed (Migraine) Take 100 mg at onset of headache; can repeat in 2 hours if no change; not to be taken more than 2 days a week. 10 tablet 3    metoprolol succinate (TOPROL XL) 50 MG extended release tablet Take 1 tablet by mouth daily 90 tablet 3    Acetaminophen (TYLENOL ARTHRITIS PAIN PO) Take by mouth      citalopram (CELEXA) 20 MG tablet  Take 1 tablet by mouth daily 30 tablet 0    vitamin B-12 (CYANOCOBALAMIN) 1000 MCG tablet Take 1 tablet by mouth daily      albuterol sulfate HFA (PROVENTIL;VENTOLIN;PROAIR) 108 (90 Base) MCG/ACT inhaler Inhale 2 puffs into the lungs every 6 hours as needed      aspirin 81 MG chewable tablet Take 1 tablet by mouth daily      cetirizine (ZYRTEC) 10 MG tablet Take 1 tablet by mouth daily      docusate (COLACE, DULCOLAX) 100 MG CAPS Col-Rite 100 mg capsule   take 1 capsule by mouth twice a day if needed for constipation      fluticasone (FLONASE) 50 MCG/ACT nasal spray 1 spray by nasal route daily      furosemide (LASIX) 40 MG tablet Take 1 tablet by mouth daily as needed         Allergies:  Allergies   Allergen Reactions    Lithium Nausea And Vomiting     Vomiting      Alprazolam Other (See Comments)     Gets suicidal    Dermatitis Antigen      Rash and blisers  Rash and blisers    Diazepam Other (See Comments)     Gets suicidal    Levofloxacin Other (See Comments)     Muscle stiffness and bulging muscles     Tobacco Other (See Comments)     Increasing lung deterioration     Adhesive Tape Rash     blisters       Social Determinants of Health:  Social Determinants of Health     Tobacco Use: High Risk (04/06/2022)    Patient History     Smoking Tobacco Use: Every Day     Smokeless Tobacco Use: Never     Passive Exposure: Current   Alcohol Use: Not At Risk (12/06/2021)    AUDIT-C     Frequency of Alcohol Consumption: Never     Average Number of Drinks: Patient does not drink     Frequency of Binge Drinking: Never   Financial Resource Strain: Not on  file   Food Insecurity: No Food Insecurity (05/07/2022)    Hunger Vital Sign     Worried About Running Out of Food in the Last Year: Never true     Ran Out of Food in the Last Year: Never true   Transportation Needs: Unmet Transportation Needs (05/07/2022)    PRAPARE - Armed forces logistics/support/administrative officer (Medical): Yes     Lack of Transportation (Non-Medical): Yes    Physical Activity: Not on file   Stress: Not on file   Social Connections: Not on file   Intimate Partner Violence: Not on file   Depression: Not at risk (09/24/2021)    PHQ-2     PHQ-2 Score: 0   Housing Stability: High Risk (05/07/2022)    Housing Stability Vital Sign     Unable to Pay for Housing in the Last Year: No     Number of Places Lived in the Last Year: 1     Unstable Housing in the Last Year: Yes   Interpersonal Safety: Not At Risk (05/07/2022)    Interpersonal Safety ([place])     How often does anyone, including family and friends, physically hurt you?: Never     How often does anyone, including family and friends, scream or curse at you?: Not on file     How often does anyone, including family and friends, insult or talk down to you?: Not on file     How often does anyone, including family and friends, threaten you with harm?: Not on file   Utilities: Not At Risk (05/07/2022)    [facility] Utilities     Threatened with loss of utilities: No       Review of Systems     Negative except as listed above in HPI.    Physical Exam     Vitals:    05/15/22 0457 05/15/22 0546 05/15/22 0743   BP: (!) 143/99     Pulse: 77     Resp: 16     Temp:  97.7 F (36.5 C) 98 F (36.7 C)   TempSrc:  Oral Oral   SpO2: 99%     Weight: 73 kg (161 lb)     Height: 1.6 m (_0 )         Physical Exam  Constitutional:       General: She is not in acute distress.     Appearance: She is not ill-appearing, toxic-appearing or diaphoretic.   HENT:      Head: Normocephalic and atraumatic.      Nose: Nose normal.      Mouth/Throat:      Mouth: Mucous membranes are moist.      Pharynx: Oropharynx is clear.   Eyes:      Extraocular Movements: Extraocular movements intact.      Pupils: Pupils are equal, round, and reactive to light.   Cardiovascular:      Rate and Rhythm: Normal rate and regular rhythm.   Pulmonary:      Effort: Pulmonary effort is normal.      Breath sounds: Normal breath sounds.   Abdominal:      General: Abdomen is flat.       Palpations: Abdomen is soft.   Musculoskeletal:         General: No swelling, tenderness, deformity or signs of injury.      Right lower leg: No edema.      Left lower leg: No edema.   Skin:  General: Skin is dry.      Capillary Refill: Capillary refill takes less than 2 seconds.      Coloration: Skin is not jaundiced or pale.      Findings: No bruising, erythema, lesion or rash.      Comments: Distal pulses palpable in bilateral feet good capillary refill time no chilblains no frostbite   Neurological:      Mental Status: She is alert and oriented to person, place, and time.      Comments: Appears somewhat intoxicated.   Psychiatric:         Mood and Affect: Mood normal.         Diagnostic Study Results     Labs:  Recent Results (from the past 12 hour(s))   EKG 12 Lead    Collection Time: 05/15/22  5:51 AM   Result Value Ref Range    Ventricular Rate 52 BPM    Atrial Rate 52 BPM    P-R Interval 150 ms    QRS Duration 92 ms    Q-T Interval 450 ms    QTc Calculation (Bazett) 418 ms    P Axis 91 degrees    R Axis 37 degrees    T Axis 48 degrees    Diagnosis       Sinus bradycardia  Minimal voltage criteria for LVH, may be normal variant ( Cornell product )  ST & T wave abnormality, consider anterior ischemia  Abnormal ECG  When compared with ECG of 05-May-2022 05:49,  No significant change was found     CBC with Auto Differential    Collection Time: 05/15/22  7:00 AM   Result Value Ref Range    WBC 10.2 4.6 - 13.2 K/uL    RBC 4.69 4.20 - 5.30 M/uL    Hemoglobin 14.2 12.0 - 16.0 g/dL    Hematocrit 43.6 35.0 - 45.0 %    MCV 93.0 78.0 - 100.0 FL    MCH 30.3 24.0 - 34.0 PG    MCHC 32.6 31.0 - 37.0 g/dL    RDW 13.1 11.6 - 14.5 %    Platelets 233 135 - 420 K/uL    MPV 10.7 9.2 - 11.8 FL    Nucleated RBCs 0.0 0 PER 100 WBC    nRBC 0.00 0.00 - 0.01 K/uL    Neutrophils % 74 (H) 40 - 73 %    Lymphocytes % 20 (L) 21 - 52 %    Monocytes % 5 3 - 10 %    Eosinophils % 1 0 - 5 %    Basophils % 1 0 - 2 %    Immature Granulocytes  0 0.0 - 0.5 %    Neutrophils Absolute 7.5 1.8 - 8.0 K/UL    Lymphocytes Absolute 2.0 0.9 - 3.6 K/UL    Monocytes Absolute 0.5 0.05 - 1.2 K/UL    Eosinophils Absolute 0.1 0.0 - 0.4 K/UL    Basophils Absolute 0.1 0.0 - 0.1 K/UL    Absolute Immature Granulocyte 0.0 0.00 - 0.04 K/UL    Differential Type AUTOMATED     CMP    Collection Time: 05/15/22  7:00 AM   Result Value Ref Range    Sodium 142 136 - 145 mmol/L    Potassium 3.2 (L) 3.5 - 5.5 mmol/L    Chloride 108 100 - 111 mmol/L    CO2 26 21 - 32 mmol/L    Anion Gap 8 3.0 - 18 mmol/L    Glucose  72 (L) 74 - 99 mg/dL    BUN 11 7.0 - 18 MG/DL    Creatinine 0.96 0.6 - 1.3 MG/DL    Bun/Cre Ratio 11 (L) 12 - 20      Est, Glom Filt Rate >60 >60 ml/min/1.55m    Calcium 9.4 8.5 - 10.1 MG/DL    Total Bilirubin 0.9 0.2 - 1.0 MG/DL    ALT 29 13 - 56 U/L    AST 28 10 - 38 U/L    Alk Phosphatase 56 45 - 117 U/L    Total Protein 6.9 6.4 - 8.2 g/dL    Albumin 3.9 3.4 - 5.0 g/dL    Globulin 3.0 2.0 - 4.0 g/dL    Albumin/Globulin Ratio 1.3 0.8 - 1.7     Lactic Acid    Collection Time: 05/15/22  7:00 AM   Result Value Ref Range    Lactic Acid, Plasma 0.8 0.4 - 2.0 MMOL/L   ETOH    Collection Time: 05/15/22  7:00 AM   Result Value Ref Range    Ethanol Lvl <3 0 - 3 MG/DL       Radiologic Studies:   Non-plain film images such as CT, Ultrasound and MRI are read by the radiologist. Plain radiographic images are visualized and preliminarily interpreted by the ED Provider with the below findings:          Interpretation per the Radiologist below, if available at the time of this note:    No orders to display       EKG is interpreted by me from 0551 hrs. shows a sinus bradycardia with a ventricular rate 52 bpm PR interval 150 QRS duration 92 QT/QTc 450/418 physiologic axis.  I do not see any ST depressions nor elevations to suggest acute myocardial ischemia nor infarction.  There are inverted T waves that persist through V3.    Procedures     Unless otherwise noted below,  none.  Procedures    ED Course     5:53 AM EST I (Earnest Conroy am the first provider for this patient. Initial assessment performed. I reviewed the vital signs, available nursing notes, past medical history, past surgical history, family history and social history. The patients presenting problems have been discussed, and they are in agreement with the care plan formulated and outlined with them.  I have encouraged them to ask questions as they arise throughout their visit.    Records Reviewed: Nursing Notes    Is this patient to be included in the SEP-1 core measure? No Exclusion criteria - the patient is NOT to be included for SEP-1 Core Measure due to: Infection is not suspected    MEDICATIONS ADMINISTERED IN THE ED:  Medications   potassium bicarbonate (EFFER-K/K-LYTE) disintegrating tablet 50 mEq (has no administration in time range)   sodium chloride 0.9 % bolus 1,000 mL (1,000 mLs IntraVENous New Bag 05/15/22 0742)       ED Course as of 05/15/22 0952   Sun May 15, 2022   0751 Ethanol Lvl: <3 [AK]   0751 BUN,BUNPL: 11 [AK]   0751 Creatinine: 0.96 [AK]   0751 WBC: 10.2 [AK]   0751 Hemoglobin Quant: 14.2 [AK]   0751 Hematocrit: 43.6 [AK]   0751 Platelet Count: 233 [AK]   0909 Patient has been on the bear hugger for a couple of hours now feet are warm she is warm pain is decreased.  She has gotten a 2-hour nap.  Nothing exciting found on laboratory workup.  Called lab because sodium-potassium chloride and anion gap have been pending for 2 hours.  If these are normal I expect the patient should be dischargeable. [AK]   0926 Sodium: 142 [AK]   0926 Potassium(!): 3.2 [AK]   0926 BUN,BUNPL: 11 [AK]   0926 Creatinine: 0.96  Mild hypokalemia at 3.2.  Repleted with oral potassium bicarbonate. [AK]   0951 Feet warm continues to have good pedal pulses.  Potassium has been replaced orally.  Patient prepared and ready for discharge. [AK]      ED Course User Index  [AK] Sunnie Nielsen, MD       None    Screenings                   CIWA Assessment  BP: (!) 143/99  Pulse: 58       Medical Decision Making     DDX: Frostbite, hypothermia, worried well, electrolyte abnormality, infectious process, other    DISCUSSION:  Severity of condition: Mild  Acuity of condition: Acute    56 y.o. female with a likely mild hypothermia.  Patient declines rectal temperature.  Bear hugger applied based on clinical suspicion.  Initial laboratory workup ordered.       In evaluation of the above differential diagnosis, consideration was given to the following tests and treatments.      The decision to perform testing and results were discussed with the patient. I discussed each of these tests and considerations with the patient. They agree with the plan of discharge.    Additional Considerations:  Apparent homelessness at this time appears to complicate her ability to access and maintain health care.      Diagnosis and Disposition     DISPOSITION:  DISPOSITION Decision To Discharge 05/15/2022 09:51:33 AM      DISCHARGE NOTE:  Kamarah Bilotta  results have been reviewed with her.  She has been counseled regarding her diagnosis, treatment, and plan.  She verbally conveys understanding and agreement of the signs, symptoms, diagnosis, treatment and prognosis and additionally agrees to follow up as discussed.  She also agrees with the care-plan and conveys that all of her questions have been answered.  I have also provided discharge instructions for her that include: educational information regarding their diagnosis and treatment, and list of reasons why they would want to return to the ED prior to their follow-up appointment, should her condition change. She has been provided with education for proper emergency department utilization.     CLINICAL IMPRESSION:  1. Pain in both feet    2. Hypokalemia        PLAN:  D/C Home    DISCHARGE MEDICATIONS:  Current Discharge Medication List             Details   lamoTRIgine (LAMICTAL) 25 MG tablet Take 1 tablet by  mouth in the morning, at noon, and at bedtime  Qty: 30 tablet, Refills: 3      empagliflozin (JARDIANCE) 10 MG tablet Take 1 tablet by mouth daily  Qty: 30 tablet, Refills: 3      nicotine (NICODERM CQ) 14 MG/24HR Place 1 patch onto the skin daily  Qty: 30 patch, Refills: 3      omeprazole (PRILOSEC) 40 MG delayed release capsule Take 1 capsule by mouth daily  Qty: 30 capsule, Refills: 1      pravastatin (PRAVACHOL) 40 MG tablet Take 1 tablet by mouth daily  Qty: 90 tablet, Refills: 2      warfarin (  COUMADIN) 2.5 MG tablet Take 1 tablet by mouth daily  Qty: 90 tablet, Refills: 2      TRELEGY ELLIPTA 100-62.5-25 MCG/ACT AEPB inhaler inhale 1 puff by mouth and INTO THE LUNGS once daily      pregabalin (LYRICA) 200 MG capsule Take 1 capsule by mouth 3 times daily for 180 days.  Qty: 90 capsule, Refills: 5    Associated Diagnoses: Chronic left-sided low back pain without sciatica; Other intervertebral disc degeneration, lumbosacral region; Chronic pain syndrome      baclofen (LIORESAL) 10 MG tablet Take 0.5-1 tablets by mouth 3 times daily as needed (pain/spasm)  Qty: 60 tablet, Refills: 0    Associated Diagnoses: Chronic left-sided low back pain without sciatica      Ubrogepant (UBRELVY) 100 MG TABS Take 100 mg by mouth as needed (Migraine) Take 100 mg at onset of headache; can repeat in 2 hours if no change; not to be taken more than 2 days a week.  Qty: 10 tablet, Refills: 3    Associated Diagnoses: Migraine without aura and without status migrainosus, not intractable      metoprolol succinate (TOPROL XL) 50 MG extended release tablet Take 1 tablet by mouth daily  Qty: 90 tablet, Refills: 3      Acetaminophen (TYLENOL ARTHRITIS PAIN PO) Take by mouth      citalopram (CELEXA) 20 MG tablet Take 1 tablet by mouth daily  Qty: 30 tablet, Refills: 0      vitamin B-12 (CYANOCOBALAMIN) 1000 MCG tablet Take 1 tablet by mouth daily      albuterol sulfate HFA (PROVENTIL;VENTOLIN;PROAIR) 108 (90 Base) MCG/ACT inhaler Inhale 2  puffs into the lungs every 6 hours as needed      aspirin 81 MG chewable tablet Take 1 tablet by mouth daily      cetirizine (ZYRTEC) 10 MG tablet Take 1 tablet by mouth daily      docusate (COLACE, DULCOLAX) 100 MG CAPS Col-Rite 100 mg capsule   take 1 capsule by mouth twice a day if needed for constipation      fluticasone (FLONASE) 50 MCG/ACT nasal spray 1 spray by nasal route daily      furosemide (LASIX) 40 MG tablet Take 1 tablet by mouth daily as needed             DISCONTINUED MEDICATIONS:  Current Discharge Medication List          PATIENT REFERRED TO:  Follow Up with:  Vincenza Hews, MD  [address] [place] [address]  [place] [address]  [phone]    Schedule an appointment as soon as possible for a visit       [facility] EMERGENCY DEPT  [place]  [phone]  Go to   As needed, If symptoms worsen      I Earnest Conroy am the primary clinician of record.    Dragon Disclaimer     Please note that this dictation was completed with Dragon, the Acupuncturist.  Quite often unanticipated grammatical, syntax, homophones, and other interpretive errors are inadvertently transcribed by the computer software.  Please disregard these errors.  Please excuse any errors that have escaped final proofreading.    Earnest Conroy, MD  (Electronically signed)           Sunnie Nielsen, MD  05/15/22 [phone]

## 2022-05-15 NOTE — ED Notes (Signed)
Discharge medications reviewed with the patient and appropriate educational materials and side effects teaching were provided.

## 2022-05-15 NOTE — ED Notes (Signed)
Assumed care of patient, patient resting comfortably on stretcher, no complaints at this time.

## 2022-05-15 NOTE — ED Notes (Signed)
Assumed care of patient. Patient noted lying in bed with bair hugger on. Even rise and fall of chest noted. Patient easily aroused.

## 2022-05-15 NOTE — Other (Signed)
Crisis note: Discussed this patient with Dr. Joeseph Amor, the on-call psychiatrist who agrees this patient needs admission; however, due to insufficient staffing, patient will not be able to go up on the [facility] unit until after 11pm.

## 2022-05-16 LAB — URINE DRUG SCREEN
Amphetamine, Urine: NEGATIVE
Barbiturates, Urine: NEGATIVE
Benzodiazepines, Urine: NEGATIVE
Cocaine, Urine: POSITIVE — AB
Methadone, Urine: NEGATIVE
Opiates, Urine: NEGATIVE
Phencyclidine, Urine: NEGATIVE
THC, TH-Cannabinol, Urine: POSITIVE — AB

## 2022-05-16 LAB — COVID-19 & INFLUENZA COMBO
Rapid Influenza A By PCR: NOT DETECTED
Rapid Influenza B By PCR: NOT DETECTED
SARS-CoV-2, PCR: NOT DETECTED

## 2022-05-16 LAB — POCT GLUCOSE: POC Glucose: 89 mg/dL (ref 70–110)

## 2022-05-16 LAB — PROTIME-INR
INR: 2.7 — ABNORMAL HIGH (ref 0.9–1.1)
Protime: 29.2 s — ABNORMAL HIGH (ref 11.9–14.7)

## 2022-05-16 MED ORDER — PREGABALIN 75 MG PO CAPS
75 MG | Freq: Three times a day (TID) | ORAL | Status: DC
Start: 2022-05-16 — End: 2022-05-23
  Administered 2022-05-16 – 2022-05-23 (×23): 200 mg via ORAL

## 2022-05-16 MED ORDER — EMPAGLIFLOZIN 10 MG PO TABS
10 MG | Freq: Every day | ORAL | Status: DC
Start: 2022-05-16 — End: 2022-05-23
  Administered 2022-05-16 – 2022-05-23 (×8): 10 mg via ORAL

## 2022-05-16 MED ORDER — ZIPRASIDONE HCL 20 MG PO CAPS
20 MG | Freq: Once | ORAL | Status: AC
Start: 2022-05-16 — End: 2022-05-16
  Administered 2022-05-16: 15:00:00 20 mg via ORAL

## 2022-05-16 MED ORDER — ASPIRIN 81 MG PO CHEW
81 MG | Freq: Every day | ORAL | Status: DC
Start: 2022-05-16 — End: 2022-05-23
  Administered 2022-05-16 – 2022-05-23 (×8): 81 mg via ORAL

## 2022-05-16 MED ORDER — PRAVASTATIN SODIUM 20 MG PO TABS
20 MG | Freq: Every evening | ORAL | Status: DC
Start: 2022-05-16 — End: 2022-05-23
  Administered 2022-05-17 – 2022-05-23 (×7): 40 mg via ORAL

## 2022-05-16 MED ORDER — FERROUS SULFATE 325 (65 FE) MG PO TABS
325 (65 Fe) MG | Freq: Every day | ORAL | Status: DC
Start: 2022-05-16 — End: 2022-05-23
  Administered 2022-05-16 – 2022-05-23 (×8): 325 mg via ORAL

## 2022-05-16 MED ORDER — METOPROLOL SUCCINATE ER 25 MG PO TB24
25 MG | Freq: Every day | ORAL | Status: DC
Start: 2022-05-16 — End: 2022-05-23
  Administered 2022-05-16 – 2022-05-22 (×7): 50 mg via ORAL

## 2022-05-16 MED ORDER — WARFARIN SODIUM 5 MG PO TABS
5 MG | Freq: Every day | ORAL | Status: DC
Start: 2022-05-16 — End: 2022-05-16

## 2022-05-16 MED ORDER — WARFARIN DAILY DOSING BY PHARMACIST (PLACEHOLDER)
Status: DC
Start: 2022-05-16 — End: 2022-05-23

## 2022-05-16 MED ORDER — LAMOTRIGINE 25 MG PO TABS
25 MG | Freq: Three times a day (TID) | ORAL | Status: DC
Start: 2022-05-16 — End: 2022-05-17
  Administered 2022-05-16 – 2022-05-17 (×4): 25 mg via ORAL

## 2022-05-16 MED ORDER — CITALOPRAM HYDROBROMIDE 10 MG PO TABS
10 MG | Freq: Every day | ORAL | Status: DC
Start: 2022-05-16 — End: 2022-05-17
  Administered 2022-05-16 – 2022-05-17 (×2): 10 mg via ORAL

## 2022-05-16 MED ORDER — IPRATROPIUM-ALBUTEROL 0.5-2.5 (3) MG/3ML IN SOLN
RESPIRATORY_TRACT | Status: DC | PRN
Start: 2022-05-16 — End: 2022-05-23
  Administered 2022-05-16: 12:00:00 1 via RESPIRATORY_TRACT

## 2022-05-16 MED ORDER — FUROSEMIDE 20 MG PO TABS
20 MG | Freq: Every day | ORAL | Status: DC | PRN
Start: 2022-05-16 — End: 2022-05-23

## 2022-05-16 MED ORDER — WARFARIN SODIUM 7.5 MG PO TABS
7.5 MG | Freq: Every day | ORAL | Status: AC
Start: 2022-05-16 — End: 2022-05-16
  Administered 2022-05-16 – 2022-05-17 (×2): 7.5 mg via ORAL

## 2022-05-16 MED FILL — WARFARIN SODIUM 5 MG PO TABS: 5 MG | ORAL | Qty: 2

## 2022-05-16 MED FILL — IPRATROPIUM-ALBUTEROL 0.5-2.5 (3) MG/3ML IN SOLN: RESPIRATORY_TRACT | Qty: 3

## 2022-05-16 MED FILL — LAMOTRIGINE 25 MG PO TABS: 25 MG | ORAL | Qty: 1

## 2022-05-16 MED FILL — PREGABALIN 50 MG PO CAPS: 50 MG | ORAL | Qty: 1

## 2022-05-16 MED FILL — POTASSIUM CHLORIDE CRYS ER 20 MEQ PO TBCR: 20 MEQ | ORAL | Qty: 2

## 2022-05-16 MED FILL — ASPIRIN LOW DOSE 81 MG PO CHEW: 81 MG | ORAL | Qty: 1

## 2022-05-16 MED FILL — CITALOPRAM HYDROBROMIDE 20 MG PO TABS: 20 MG | ORAL | Qty: 1

## 2022-05-16 MED FILL — METOPROLOL SUCCINATE ER 25 MG PO TB24: 25 MG | ORAL | Qty: 2

## 2022-05-16 MED FILL — FERROUS SULFATE 325 (65 FE) MG PO TABS: 325 (65 Fe) MG | ORAL | Qty: 1

## 2022-05-16 MED FILL — JARDIANCE 10 MG PO TABS: 10 MG | ORAL | Qty: 1

## 2022-05-16 MED FILL — ZIPRASIDONE HCL 20 MG PO CAPS: 20 MG | ORAL | Qty: 1

## 2022-05-16 NOTE — Other (Signed)
Crisis Note: Patient receptive to voluntary inpatient admission to any accepting facility in surrounding area.

## 2022-05-16 NOTE — Progress Notes (Signed)
[facility] Dosing Services    Consult for Warfarin Dosing by Pharmacy by Dr. Quay Burow  Consult provided for this 57 y.o.  female , for indication of Heart Valve Replacement  Patient admitted for Suicide ideation    Dose to achieve an INR goal of 2.5 to 3.5  Home dose: 10 mg po on Tuesday and Saturday and then 7.5 mg po daily on All other days for a total of 57.5 mg/Week    Warfarin  7.5 (mg) to be given today at 18:00.     DATE INR DOSE   01/01 2.7 7.5       Significant drug interactions: None  PT/INR Lab Results   Component Value Date/Time    INR 2.7 05/16/2022 01:00 AM    INR 2.2 04/07/2020 09:20 AM    INREXT 2.0 05/27/2021 12:00 AM       Platelets Lab Results   Component Value Date/Time    PLT 229 05/15/2022 06:00 PM      H/H Lab Results   Component Value Date/Time    HGB 12.9 05/15/2022 06:00 PM        Pharmacy to follow daily and will provide subsequent Warfarin dosing based on clinical status.     Signed Alver Sorrow, [place]

## 2022-05-16 NOTE — Other (Signed)
Crisis note:    Patient has been accepted for admission by Dr. Joeseph Amor.  Admit to STU 1   Admit to Dr. Purnell Shoemaker service  Orders received  Report to Lattie Haw, RN on unit  ED provider informed.

## 2022-05-16 NOTE — Behavioral Health Treatment Team (Addendum)
Patient was escorted to the unit by nursing staff and security.  She was alert and oriented in all spheres.  Patient denied any thoughts or plans to harm herself.  Her affect was sad and depressed.  Patient also appeared drowsy and sleeping.  She asked to go to her room immediately after arriving on the unit.  When writer inquired patient about her reason for her admissions she began sobbing uncontrollable and It was difficult to fully understand fully she was saying.  In addition to this patient is edentulous.   She says that she was living a residential home but did not get along with "the woman" and was talked into leaving. And "I did",patient said.  Patient continued crying and told writer that she no longer wanted to talk about.  Patient was cooperative with signing admission paper work and was oriented to her room and unit.    [place] family was contacted for patient H & P.    RN'S will initiate, develop, implement, review or revised treatment plan.

## 2022-05-16 NOTE — ED Notes (Signed)
Report given to [facility] on [employer].

## 2022-05-16 NOTE — Progress Notes (Signed)
Crisis Note: Writer contacted on call psychiatric for admission orders. On call psychiatric recommends writer conducting bed search. Patient will be referred to [facility].

## 2022-05-16 NOTE — ED Notes (Signed)
Pt resting quietly on stretcher at this time with no s/s of distress noted.

## 2022-05-16 NOTE — H&P (Signed)
[facility]  FAMILY MEDICINE CONSULT NOTE FOR BEHAVIORAL HEALTH UNIT    Patient:    Mary [name] , 57 y.o. female   Room/Bed:  109/01  Admission Date:   05/15/2022  Code status:  Prior      Reason for Consult: Medical Evaluation   Psychiatry Attending Requesting Consult: Dr. Freddy Finner    ASSESSMENT:  Mary [name] is a 57 y.o. female w/ a PMH of COPD, A-fib, CHF, mitral regurgitation s/p replacement, mitral stenosis, anxiety, bipolar, PTSD presenting for SI  who is now admitted to the [facility] Behavioral Health Unit.    Nurse Chaperone during History and Physical:     PLAN:    Suicidal Ideation  Hx bipolar, anxiety, PTSD  -Citalopram 10 mg daily   -Lamictal 25 mg BID  -Management per inpatient psychiatry    HFpEF/mitral valve replacement/pAFIB, HTN/moderate MR/HLD    -last ECHO (05/06/22): EF 50-55%, dilated left ventricle, moderate aortic regurgitation, aortic stenosis, prosthetic mitral valve, tricuspid regurg, moderate pulmonary HTN present   -On warfarin - goal INR 2.5-3.5 - dosing per pharmacy, daily INR  -Aspirin 81 mg daily  -Jardiance 10 mg daily   -Metoprolol 50 mg daily  -pravastatin 40 mg nightly  -furosemide 40 mg daily PRN     COPD  -duonebs PRN  -outpatient medication listed as Trelegy ellipta   -was given brovana during recent admission -consider restarting     Chronic low back pain/DDD  -pregabalin 200 mg TID  -PRN tylenol    Concern for frostbite  -patient spent several hours out in the cold, is having pain in bilateral feet, is worried about frostbite. No evidence of frostbite noted by ED or on our exam  -tylenol PRN for pain        Global  Diet: Regular   Mobility: per protocol    Thank you for this consult.         SUBJECTIVE:   Dr. Freddy Finner has consulted Family Medicine to evaluate Mary [name]  in the Emergency Department. She  is a 58 y.o. female w/ PMHx of COPD, A-fib, CHF, mitral regurgitation, mitral stenosis, anxiety, bipolar, PTSD presenting for SI who is now admitted to the [facility]  Behavioral Health Unit.     ED Course:  -Vitals: 152/102, HR 77, RR 16, afebrile, 94% on RA  -Labs: CMP unremarkable, glucose 71, UDS positive for cocaine and THC, CBC unremarkable, Prothrombin time 29.2, INR 2.7  -Imaging: none   -EKG: sinus bradycardia 52  -Meds: warfarin, geodon, lyrica, metoprolol, lamictal, iron, jardiance, celexa, aspirin, duoneb  -IVF: none   -Procedures: none   -Consults:[facility]    CURRENT MEDICATIONS:  Current Facility-Administered Medications   Medication Dose Route Frequency Provider Last Rate Last Admin    ferrous sulfate (IRON 325) tablet 325 mg  325 mg Oral Daily with breakfast Lawerance Bach, Ellie L, MD   325 mg at 05/16/22 0937    citalopram (CELEXA) tablet 10 mg  10 mg Oral Daily Burns, Ellie L, MD   10 mg at 05/16/22 0938    ipratropium 0.5 mg-albuterol 2.5 mg (DUONEB) nebulizer solution 1 Dose  1 Dose Inhalation Q4H PRN Vista Lawman, MD   1 Dose at 05/16/22 1610    aspirin chewable tablet 81 mg  81 mg Oral Daily Burns, Ellie L, MD   81 mg at 05/16/22 0940    empagliflozin (JARDIANCE) tablet 10 mg  10 mg Oral Daily Vista Lawman, MD   10 mg at 05/16/22 [phone]  furosemide (LASIX) tablet 40 mg  40 mg Oral Daily PRN Terie Purser, MD        lamoTRIgine (LAMICTAL) tablet 25 mg  25 mg Oral TID Mikle Bosworth L, MD   25 mg at 05/16/22 1553    metoprolol succinate (TOPROL XL) extended release tablet 50 mg  50 mg Oral Daily Burns, Ellie L, MD   50 mg at 05/16/22 0347    pravastatin (PRAVACHOL) tablet 40 mg  40 mg Oral Nightly Burns, Ellie L, MD        pregabalin (LYRICA) capsule 200 mg  200 mg Oral TID Mikle Bosworth L, MD   200 mg at 05/16/22 1553    warfarin placeholder: dosing by pharmacy   Other RX Placeholder Terie Purser, MD        warfarin (COUMADIN) tablet 7.5 mg  7.5 mg Oral Daily Burns, Ellie L, MD   7.5 mg at 05/16/22 0423       ROS (positive findings are in BOLD; negative findings are in regular font)  Constitutional: fevers, chills, appetite changes, weight changes, fatigue  HEENT:  changes in vision, changes in hearing, sore throat, dysphagia  Cardiovascular: chest pain, palpitations, PND, orthopnea, edema  Pulmonary: SOB, cough, sputum production, wheezing, chest tightness  Gastrointestinal: abdominal pain, nausea/vomiting, diarrhea, constipation, melena, hematochezia  Genitourinary: dysuria, hesitation, dribbling, urgency, hematuria  Musculoskeletal: arthralgias, myalgias  Skin: rash, itching  Neurological: sensory changes, motor changes, headache  Psychiatric: mood changes      HOME MEDICATIONS:   Current Outpatient Medications   Medication Instructions    Acetaminophen (TYLENOL ARTHRITIS PAIN PO) Oral    albuterol sulfate HFA (PROVENTIL;VENTOLIN;PROAIR) 108 (90 Base) MCG/ACT inhaler 2 puffs, Inhalation, EVERY 6 HOURS PRN    aspirin 81 mg, Oral, DAILY    baclofen (LIORESAL) 5-10 mg, Oral, 3 TIMES DAILY PRN    cetirizine (ZYRTEC) 10 mg, Oral, DAILY    citalopram (CELEXA) 20 mg, Oral, DAILY    docusate (COLACE, DULCOLAX) 100 MG CAPS Col-Rite 100 mg capsule   take 1 capsule by mouth twice a day if needed for constipation    empagliflozin (JARDIANCE) 10 mg, Oral, DAILY    fluticasone (FLONASE) 50 MCG/ACT nasal spray 1 spray by nasal route daily    furosemide (LASIX) 40 mg, Oral, DAILY PRN    lamoTRIgine (LAMICTAL) 25 mg, Oral, 3 times daily    metoprolol succinate (TOPROL XL) 50 mg, Oral, DAILY    nicotine (NICODERM CQ) 14 MG/24HR 1 patch, TransDERmal, DAILY    omeprazole (PRILOSEC) 40 mg, Oral, DAILY    pravastatin (PRAVACHOL) 40 mg, Oral, DAILY    pregabalin (LYRICA) 200 mg, Oral, 3 TIMES DAILY    TRELEGY ELLIPTA 100-62.5-25 MCG/ACT AEPB inhaler inhale 1 puff by mouth and INTO THE LUNGS once daily    Ubrelvy 100 mg, Oral, PRN, Take 100 mg at onset of headache; can repeat in 2 hours if no change; not to be taken more than 2 days a week.    vitamin B-12 (CYANOCOBALAMIN) 1,000 mcg, Oral, DAILY    warfarin (COUMADIN) 2.5 mg, Oral, DAILY       MEDICAL HISTORY:  Past Medical History:   Diagnosis  Date    Anxiety     Bipolar 1 disorder (HCC)     CHF (congestive heart failure) (HCC)     COPD (chronic obstructive pulmonary disease) (HCC)     DDD (degenerative disc disease), cervical     GERD (gastroesophageal reflux disease)     H/O  aortic valve replacement     History of transient ischemic attack (TIA)     Mitral regurgitation and mitral stenosis     PTSD (post-traumatic stress disorder)     Scoliosis     Vitamin D deficiency        Past Surgical History:   Procedure Laterality Date    CARDIAC CATHETERIZATION      CARDIAC VALVE SURGERY      mitral valve replacement    CESAREAN SECTION      HAND/FINGER SURGERY UNLISTED Right     I&D right middle finger    HYSTERECTOMY (CERVIX STATUS UNKNOWN)      IR KYPHOPLASTY THORACIC FIRST LEVEL  03/03/2022    IR KYPHOPLASTY THORACIC FIRST LEVEL 03/03/2022 Samoilov, Dmitri E, MD [facility] RAD ANGIO IR    KNEE SURGERY      OVARY REMOVAL         Family History   Problem Relation Age of Onset    No Known Problems Mother        Social History     Socioeconomic History    Marital status: Divorced     Spouse name: None    Number of children: None    Years of education: None    Highest education level: None   Tobacco Use    Smoking status: Every Day     Current packs/day: 0.75     Types: Cigarettes     Passive exposure: Current    Smokeless tobacco: Never   Vaping Use    Vaping Use: Never used   Substance and Sexual Activity    Alcohol use: Never    Drug use: Yes     Types: Marijuana (Weed)     Social Determinants of Health     Food Insecurity: No Food Insecurity (05/07/2022)    Hunger Vital Sign     Worried About Running Out of Food in the Last Year: Never true     Ran Out of Food in the Last Year: Never true   Transportation Needs: Unmet Transportation Needs (05/07/2022)    PRAPARE - Therapist, art (Medical): Yes     Lack of Transportation (Non-Medical): Yes   Housing Stability: High Risk (05/07/2022)    Housing Stability Vital Sign     Unable to Pay for  Housing in the Last Year: No     Number of Places Lived in the Last Year: 1     Unstable Housing in the Last Year: Yes       Allergies   Allergen Reactions    Lithium Nausea And Vomiting     Vomiting      Alprazolam Other (See Comments)     Gets suicidal    Dermatitis Antigen      Rash and blisers  Rash and blisers    Diazepam Other (See Comments)     Gets suicidal    Levofloxacin Other (See Comments)     Muscle stiffness and bulging muscles     Tobacco Other (See Comments)     Increasing lung deterioration     Adhesive Tape Rash     blisters           OBJECTIVE:   VITALS  Patient Vitals for the past 24 hrs:   Temp Pulse Resp BP SpO2   05/16/22 1740 98 F (36.7 C) 58 16 (!) 150/78 94 %   05/16/22 1645 98.3 F (36.8 C) 75 16 (!) 141/87 95 %  05/16/22 0936 98.2 F (36.8 C) 72 18 133/72 96 %   05/16/22 0000 98 F (36.7 C) 79 16 (!) 146/94 96 %       PHYSICAL EXAM  General: in no apparent distress, alert, and cooperative.  HEENT: NCAT, PERRLA, EOM intact; oral mucosa well perfused, oropharynx clear  Neck: supple, normal ROM  CVS: regular rate and rhythm, S1, S2 normal, no murmur, click, rub or gallop  Lungs: chest clear, no wheezing, rales, normal symmetric air entry    Abdomen: Soft, non-distended, non-TTP  Ext: No calf tenderness,no significant edema.  Skin: warm, dry, intact, no significant rashes/petechia/ecchymosis appreciated  Neuro: No focal neurologic deficits or gross abnormalities  Psych: A&Ox3, appropriate mood and affect, fatigued     LABS, IMAGING, AND DIAGNOSTIC STUDIES  Recent Results (from the past 24 hour(s))   POCT Glucose    Collection Time: 05/15/22  7:06 PM   Result Value Ref Range    POC Glucose 89 70 - 110 mg/dL   Protime-INR    Collection Time: 05/16/22  1:00 AM   Result Value Ref Range    Protime 29.2 (H) 11.9 - 14.7 sec    INR 2.7 (H) 0.9 - 1.1     Urine Drug Screen    Collection Time: 05/16/22  5:45 AM   Result Value Ref Range    Benzodiazepines, Urine Negative NEG      Barbiturates,  Urine Negative NEG      THC, TH-Cannabinol, Urine Positive (A) NEG      Opiates, Urine Negative NEG      PCP, Urine Negative NEG      Cocaine, Urine Positive (A) NEG      Amphetamine, Urine Negative NEG      Methadone, Urine Negative NEG      Comments: (NOTE)        ================================================================  Assessment and recommendations will be discussed with my attending, who will provide final recommendations.       Claudell Kyle, MD PGY2  [facility]  May 16, 2022 6:28 PM

## 2022-05-16 NOTE — Plan of Care (Signed)
Problem: Safety - Adult  Goal: Free from fall injury  Outcome: Progressing     Problem: Pain  Goal: Verbalizes/displays adequate comfort level or baseline comfort level  Outcome: Progressing

## 2022-05-17 LAB — PROTIME-INR
INR: 3.4 — ABNORMAL HIGH (ref 0.9–1.1)
Protime: 34.8 s — ABNORMAL HIGH (ref 11.9–14.7)

## 2022-05-17 LAB — CULTURE, URINE: Culture: NO GROWTH

## 2022-05-17 MED ORDER — LAMOTRIGINE 25 MG PO TABS
25 MG | Freq: Every day | ORAL | Status: DC
Start: 2022-05-17 — End: 2022-05-23
  Administered 2022-05-18 – 2022-05-23 (×6): 25 mg via ORAL

## 2022-05-17 MED ORDER — LURASIDONE HCL 40 MG PO TABS
40 MG | Freq: Every day | ORAL | Status: DC
Start: 2022-05-17 — End: 2022-05-19
  Administered 2022-05-17 – 2022-05-19 (×3): 20 mg via ORAL

## 2022-05-17 MED ORDER — TRAZODONE HCL 50 MG PO TABS
50 MG | Freq: Every evening | ORAL | Status: DC | PRN
Start: 2022-05-17 — End: 2022-05-19
  Administered 2022-05-18 – 2022-05-19 (×2): 50 mg via ORAL

## 2022-05-17 MED ORDER — WARFARIN SODIUM 10 MG PO TABS
10 MG | Freq: Once | ORAL | Status: AC
Start: 2022-05-17 — End: 2022-05-17
  Administered 2022-05-17: 23:00:00 10 mg via ORAL

## 2022-05-17 MED ORDER — HYDROXYZINE PAMOATE 25 MG PO CAPS
25 MG | Freq: Four times a day (QID) | ORAL | Status: DC | PRN
Start: 2022-05-17 — End: 2022-05-23
  Administered 2022-05-19 – 2022-05-23 (×8): 25 mg via ORAL

## 2022-05-17 MED ORDER — PLACEHOLDER
Status: DC
Start: 2022-05-17 — End: 2022-05-23

## 2022-05-17 MED ORDER — ACETAMINOPHEN 325 MG PO TABS
325 MG | ORAL | Status: DC | PRN
Start: 2022-05-17 — End: 2022-05-23
  Administered 2022-05-17 – 2022-05-23 (×11): 650 mg via ORAL

## 2022-05-17 MED FILL — LAMOTRIGINE 25 MG PO TABS: 25 MG | ORAL | Qty: 1

## 2022-05-17 MED FILL — CITALOPRAM HYDROBROMIDE 10 MG PO TABS: 10 MG | ORAL | Qty: 1

## 2022-05-17 MED FILL — PREGABALIN 50 MG PO CAPS: 50 MG | ORAL | Qty: 1

## 2022-05-17 MED FILL — COUMADIN 7.5 MG PO TABS: 7.5 MG | ORAL | Qty: 1

## 2022-05-17 MED FILL — METOPROLOL SUCCINATE ER 25 MG PO TB24: 25 MG | ORAL | Qty: 2

## 2022-05-17 MED FILL — PRAVASTATIN SODIUM 20 MG PO TABS: 20 MG | ORAL | Qty: 2

## 2022-05-17 MED FILL — JANTOVEN 10 MG PO TABS: 10 MG | ORAL | Qty: 1

## 2022-05-17 MED FILL — ASPIRIN LOW DOSE 81 MG PO CHEW: 81 MG | ORAL | Qty: 1

## 2022-05-17 MED FILL — ACETAMINOPHEN 325 MG PO TABS: 325 MG | ORAL | Qty: 2

## 2022-05-17 MED FILL — JARDIANCE 10 MG PO TABS: 10 MG | ORAL | Qty: 1

## 2022-05-17 MED FILL — WARFARIN SODIUM 10 MG PO TABS: 10 MG | ORAL | Qty: 1

## 2022-05-17 MED FILL — FERROUS SULFATE 325 (65 FE) MG PO TABS: 325 (65 Fe) MG | ORAL | Qty: 1

## 2022-05-17 MED FILL — LURASIDONE HCL 40 MG PO TABS: 40 MG | ORAL | Qty: 1

## 2022-05-17 NOTE — Progress Notes (Signed)
SOCIAL WORK GROUP THERAPY PROGRESS NOTE    Group Time:  10:15am    Group Topic:  Coping Skills    Group Participation:     Pt expressed not interested in attending session despite staff support

## 2022-05-17 NOTE — Group Note (Signed)
Art Therapy Group Progress Note    PATIENT SCHEDULED FOR GROUP AT:  1:00 PM     GROUP STOP TIME:  1:45 PM     ATTENDANCE: Moderate (6/11 participants)    TOPIC / FOCUS: Spectrum of emotion and congruent coping     GOALS:  emotion identification, increasing mindfulness of emotion experience, identifying current coping skills, increasing knowledge of appropriate coping skills, creating coping skill plan, encouraging self-awareness, promoting creativity and self-expression    PARTICIPATION LEVEL:   minimal, participated in art     PARTICIPATION QUALITY:  minimal, resistant, appropriate     ATTENTION LEVEL:  low investment, low energy     LEVEL OF CONSCIOUSNESS: alert, oriented     SPEECH: normal     THOUGHT PROCESS/ CONTENT: logical, preoccupied with past bad decisions, negative thinking,confused      AFFECTIVE FUNCTIONING: congruent     MOOD: depressed, tearful    SYMBOLIC & THEMATIC CONTENT AS NOTED IN IMAGERY: Pt was late to group due to meeting with SW. Pt presented as depressed, tearful, and hopeless. Pt stated they were feeling confused after meeting with SW and that they needed to process the meeting. Pt was tearfully sharing that they were tired of "making bad decisions." Pt was encouraged to participate in group by using art as a coping skill to provide distraction to relieve some of their negative thinking. PT was encouraged to scribble on their provided paper. Pt was able to use crayons to make marks on the page. Pt was provided an opportunity to share in group discussion but declined.     STATUS AFTER INTERVENTION: unchanged     RESPONSE TO LEARNING: resistant, difficulty holding focus due to severe negative thinking, depressive symptoms.     ENDINGS: none reported    MODES OF INTERVENTION: exploration, art media, socialization, psychoeducation     DISCIPLINE RESPONSIBLE: Art Therapist     Mary [name]  Art Therapist, MA

## 2022-05-17 NOTE — Progress Notes (Signed)
[facility] Dosing Services    Consult for Warfarin Dosing by Pharmacy by Dr. Quay Burow  Consult provided for this 57 y.o.  female , for indication of Heart Valve Replacement  Patient admitted for Suicide ideation    Dose to achieve an INR goal of 2.5 to 3.5  Home dose: 10 mg po on Tuesday and Saturday and then 7.5 mg po daily on All other days for a total of 57.5 mg/Week    Warfarin  10 (mg) to be given today at 18:00 per her home regimen    DATE INR DOSE   01/01 2.7 7.5 mg   01/02 3.4 10 mg       Significant drug interactions: None  PT/INR Lab Results   Component Value Date/Time    INR 3.4 05/17/2022 07:02 AM    INR 2.2 04/07/2020 09:20 AM    INREXT 2.0 05/27/2021 12:00 AM       Platelets Lab Results   Component Value Date/Time    PLT 229 05/15/2022 06:00 PM      H/H Lab Results   Component Value Date/Time    HGB 12.9 05/15/2022 06:00 PM        Pharmacy to follow daily and will provide subsequent Warfarin dosing based on clinical status.     Signed Alver Sorrow, [place]

## 2022-05-17 NOTE — Group Note (Signed)
Group Therapy Note    Date: 05/17/2022    Group Start Time: 1400  Group End Time: 1740  Group Topic: Psychoeducation        Mary [name]        Group Therapy Note    Attendees: 4      To create a recovery plan and to identify coping strategies to assist patients with meeting their goals.       Patient declined to participate  Discipline Responsible: Social Worker/Counselor      Signature:  Mary [name]

## 2022-05-17 NOTE — Plan of Care (Signed)
Problem: Pain  Goal: Verbalizes/displays adequate comfort level or baseline comfort level  05/17/2022 0634 by Sharmon Leyden, RN  Outcome: Progressing  05/16/2022 1959 by Crew, Newell Coral, RN  Outcome: Progressing     Problem: Safety - Adult  Goal: Free from fall injury  05/17/2022 0634 by Sharmon Leyden, RN  Outcome: Progressing  05/16/2022 1959 by Crew, Newell Coral, RN  Outcome: Progressing     Problem: ABCDS Injury Assessment  Goal: Absence of physical injury  Outcome: Progressing    Pt alert and oriented x 4. Denies SI,HI and AVH. Expresses concerns about living situation post discharge. Compliant with medication. No behavioral issues noted, slept well throughout the night. Q15 minute checks ongoing. Staff will continue to maintain a safe and therapeutic environment.
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
## 2022-05-17 NOTE — Progress Notes (Signed)
Pts home medication sent to pharmacy. Lorazepam 0.5mg  x6 tablets and Pregabalin 200mg  x 70 tabs counted and put in sealed bag by day shift. Unopened bag sent to pharmacy, Patient Medication Identification form placed in pts chart.

## 2022-05-17 NOTE — Group Note (Signed)
Group Therapy Note    Date: 05/17/2022    Group Start Time: 0930  Group End Time: 1540  Group Topic: Music Therapy      Jenny Reichmann        Group Therapy Note    Attendees: 5/12    Group Focus: Music therapy group consisted of a mindfulness-based music listening exercise. Therapist provided psychoeducation on mindfulness and how to utilize music as a form of mindfulness. Group members listened, wrote, and discussed what they noticed in five songs from differing genres. Group members discussed instruments and other musical elements, emotions in the music, thoughts, associations with music, memories, and body sensations. The group may promote self-awareness and use of music and mindfulness as coping skills.       Notes:  Patient did not attend group.      Discipline Responsible: Social Worker/Counselor      Signature:  Jenny Reichmann, MT-BC, [place] [employer], civil (consulting)
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
## 2022-05-17 NOTE — Behavioral Health Treatment Team (Signed)
Patient appeared to have slept for 7 hours throughout the night with no issues , will continue to monitor.

## 2022-05-17 NOTE — Group Note (Signed)
Group Therapy Note    Date: 05/17/2022    Group Start Time: 1500  Group End Time: 2297  Group Topic: Music Therapy      Jenny Reichmann        Group Therapy Note    Attendees: 5/12    Group Focus: Music therapy group consisted of collaborative music making with a combination of singing and instrumental improvisation. Group members selected songs from a song list which were played together live with instruments. The group may promote improved mood, energy, sense of self-efficacy, present-centered focus, and use of music as a coping skill.       Notes:  Patient did not attend group, reporting she was sleepy from medication.      Discipline Responsible: Social Worker/Counselor      Signature:  Jenny Reichmann, MT-BC, [place] Music Engineer, civil (consulting)

## 2022-05-17 NOTE — Behavioral Health Treatment Team (Signed)
2022- Pt has been isolated to self in bed resting with eyes closed.  Remains compliant with meds and education provided on meds given.  Pt was provided with opportunity to reflect on day for 1:1 group.  Pt did not want to engage.  Pt very depressed and tearful.  Denied si/hi/avh during time of assessment.  Will continue to monitor and support as needed.

## 2022-05-17 NOTE — Plan of Care (Signed)
Problem: Pain  Goal: Verbalizes/displays adequate comfort level or baseline comfort level  05/17/2022 1243 by Jenness Corner, RN  Outcome: Progressing  05/17/2022 0634 by Sharmon Leyden, RN  Outcome: Progressing     Problem: Safety - Adult  Goal: Free from fall injury  05/17/2022 1242 by Jenness Corner, RN  Outcome: Progressing  05/17/2022 0634 by Sharmon Leyden, RN  Outcome: Progressing     Problem: ABCDS Injury Assessment  Goal: Absence of physical injury  05/17/2022 1243 by Jenness Corner, RN  Outcome: Progressing  05/17/2022 0634 by Sharmon Leyden, RN  Outcome: Progressing    Pt is very depressed and sad. Pt denied HI, AVH but said she has had thoughts of hurting herself. Pt stated"All I do is getting on peoples nerves," and "I dont deserve to be, I serve no purpose." Pt also reported fear of being homeless upon discharge. Pt is medication compliant, and cooperative and friendly. Pt is scared to come out of room for most of the day stating "I dont want to make anyone mad." Pt is now in dayroom watching TV. Will continue to monitor for safety and comfort.

## 2022-05-18 LAB — PROTIME-INR
INR: 5.2 (ref 0.9–1.1)
Protime: 48.5 s — ABNORMAL HIGH (ref 11.9–14.7)

## 2022-05-18 MED ORDER — BENGAY GREASELESS 10-15 % EX CREA
10-15 % | Freq: Three times a day (TID) | CUTANEOUS | Status: DC | PRN
Start: 2022-05-18 — End: 2022-05-23
  Administered 2022-05-18: 23:00:00 1 via CUTANEOUS

## 2022-05-18 MED ORDER — NICOTINE POLACRILEX 2 MG MT LOZG
2 MG | OROMUCOSAL | Status: DC | PRN
Start: 2022-05-18 — End: 2022-05-23
  Administered 2022-05-18: 23:00:00 2 mg via ORAL

## 2022-05-18 MED FILL — JARDIANCE 10 MG PO TABS: 10 MG | ORAL | Qty: 1

## 2022-05-18 MED FILL — TRAZODONE HCL 50 MG PO TABS: 50 MG | ORAL | Qty: 1

## 2022-05-18 MED FILL — LAMOTRIGINE 25 MG PO TABS: 25 MG | ORAL | Qty: 1

## 2022-05-18 MED FILL — METOPROLOL SUCCINATE ER 25 MG PO TB24: 25 MG | ORAL | Qty: 2

## 2022-05-18 MED FILL — NICOTINE MINI 2 MG MT LOZG: 2 MG | OROMUCOSAL | Qty: 1

## 2022-05-18 MED FILL — PREGABALIN 50 MG PO CAPS: 50 MG | ORAL | Qty: 1

## 2022-05-18 MED FILL — MUSCLE RUB 10-15 % EX CREA: 10-15 % | CUTANEOUS | Qty: 85

## 2022-05-18 MED FILL — ASPIRIN LOW DOSE 81 MG PO CHEW: 81 MG | ORAL | Qty: 1

## 2022-05-18 MED FILL — LURASIDONE HCL 40 MG PO TABS: 40 MG | ORAL | Qty: 1

## 2022-05-18 MED FILL — ACETAMINOPHEN 325 MG PO TABS: 325 MG | ORAL | Qty: 2

## 2022-05-18 MED FILL — FERROUS SULFATE 325 (65 FE) MG PO TABS: 325 (65 Fe) MG | ORAL | Qty: 1

## 2022-05-18 MED FILL — PRAVASTATIN SODIUM 20 MG PO TABS: 20 MG | ORAL | Qty: 2

## 2022-05-18 NOTE — Behavioral Health Treatment Team (Signed)
2031- pt has been isolated to room/resting in bed. Pt was assisted with needs.  Remains compliant with meds and prn meds provided per pt request.  Pt denied si/hi/avh during time of assessment.  Flat/depressed affect observed.  Pt did not want to engage for 1:1 (daily reflection).  Will continue to monitor and support as needed.     0338- pt stated, "I keep tossing and turning.  That sleep medicine doesn't work very well."  Prn meds provided per pt request.  Encouraged pt to talk with MD during rounds in the morning about her concerns for stronger meds for insomnia.

## 2022-05-18 NOTE — Plan of Care (Signed)
Problem: Pain  Goal: Verbalizes/displays adequate comfort level or baseline comfort level  Outcome: Progressing     Problem: Safety - Adult  Goal: Free from fall injury  Outcome: Progressing     Problem: ABCDS Injury Assessment  Goal: Absence of physical injury  Outcome: Progressing     Pt presents with dull affect, depressed mood, tearful on assessment, preoccupied thought process. Pt has been social on the unit, and attends groups. Pt denies SI/HI/AVH. Pt is medication compliant.

## 2022-05-18 NOTE — Other (Signed)
Pt is a 57 year old female Bipolar Disorder , Borderline Personality Disorder Cannabis Use Disorder  and Cocaine use Disorder . Patient was admitted for an overdose of pills .       Pt.'s case was discussed in staffing. Pt continues to be tearful and depressed. Pt 's medication are being adjusted to help stabilize pt's mood.   Pt asked yesterday to be referred to [place] residential treatment program. Pt has declined the referral the patient asked to be referred o SA housing were she is willing to explore IOP in the community. Pt is tearful , depressed , anxious and has little insight. SW will continue to provide pt with support , encouragement and CBT towards dc planning.       Raynelle Chary MA,LMHP-R

## 2022-05-18 NOTE — Group Note (Addendum)
Group Therapy Note    Date: 05/18/2022    Group Start Time: 1500  Group End Time: 1600  Group Topic: Recreational        Marcello Moores, [place] Therapy Note    Attendees: 8/16     Group Focus: Recreational therapy group engaged patients through a group game. RT placed patients into two teams to encourage team building. RT used topics that focused on emotions, coping skills, and other mental health trivia questions. This group can assist in increasing positive mood, coping strategies, emotional awareness, social and problem-solving skills, and reduce stress.             Notes:  Patient did not attend group.    Recreational Therapist   Keya Wynes Marcello Moores

## 2022-05-18 NOTE — Group Note (Signed)
Group Therapy Note    Date: 05/18/2022    Group Start Time: 1400  Group End Time: 1500  Group Topic: Music Therapy      Caplan, Leanna        Group Therapy Note    Attendees: 7/16    Group Focus: Music therapy group consisted of a songwriting exercise where group members selected words from a word bank to write into their own songs or song poems. The group was split into small groups where each group member wrote individual lyrics and then came together with their small group to collaborate to form a group song. Group members added instrumentation to their original songs. The group may promote use of songwriting, creative writing, and music as outlets for self-expression.         Notes:  Patient did not attend group.      Discipline Responsible: Social Worker/Counselor      Signature:  Leanna Caplan, MT-BC, LPC  Board-Certified Music Therapist  Licensed Professional Counselor

## 2022-05-18 NOTE — Progress Notes (Signed)
[facility] Pharmacy Dosing Services    Consult for Warfarin Dosing by Pharmacy by Dr. Quay Burow  Consult provided for this 57 y.o.  female , for indication of Heart Valve Replacement  Patient admitted for Suicide ideation    Dose to achieve an INR goal of 2.5 to 3.5  Home dose: 10 mg po on Tuesday and Saturday and then 7.5 mg po daily on All other days for a total of 57.5 mg/Week    Warfarin  10 (mg) to be given today at 18:00 per her home regimen    DATE INR DOSE   01/01 2.7 7.5 mg   01/02 3.4 10 mg   01/03 5.2 DOSE HELD       Significant drug interactions: None  PT/INR Lab Results   Component Value Date/Time    INR 5.2 05/18/2022 06:23 AM    INR 2.2 04/07/2020 09:20 AM    INREXT 2.0 05/27/2021 12:00 AM       Platelets Lab Results   Component Value Date/Time    PLT 229 05/15/2022 06:00 PM      H/H Lab Results   Component Value Date/Time    HGB 12.9 05/15/2022 06:00 PM        Pharmacy to follow daily and will provide subsequent Warfarin dosing based on clinical status.     Signed Ivin Booty, [facility]

## 2022-05-18 NOTE — Behavioral Health Treatment Team (Signed)
Patient noted to have slept for approximately 7.5 hours.
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
## 2022-05-18 NOTE — Progress Notes (Signed)
Comprehensive Nutrition Assessment    Type and Reason for Visit:  Positive Nutrition Screen, Initial    Nutrition Recommendations/Plan:   Continue Current Diet.   Double Portions & sugar sub per pt request.   Continue to monitor tolerance of PO, weight, labs, and plan of care during admission.         Malnutrition Assessment:  Malnutrition Status:  At risk for malnutrition (Comment) (fluctuating intake x >1 month, significant weight loss and depression) (05/18/22 1505)      Nutrition History and Allergies:   PMHx: COPD, A-fib, CHF, mitral regurgitation s/p replacement, mitral stenosis, anxiety, bipolar, PTSD. Pt reports fluctuating intake x >1 month d/t depression, UBW of 230 lb x 2 years and weight loss. Wt hx: 161 lb (05/16/22), 154 lb (04/06/22), weight gain of 7 lb (4.5%) x  ~1 month, 180 lb (12/05/21), weight loss of 19 lb (10.5%) x 6 months, significant weight loss, 190 lb (05/30/20), weight loss of 29 lb (15.3%) x 1 year. Based on reported UBW, pt with 69 lb (30%) weight loss x 2 years. Will continue to follow weight trends. NKFA. .      Nutrition Assessment:    Pt admitted for bipolar disorder with depression, severe. Positive nutrition screen noted, no edentulous. Pt seen in group area. Pt reports good appetite, consuming the majority of meals. No PO documented in flowsheets. Pt ok with texture/consistency of current diet. Pt requesting double portions and sugar sub, writer will update. Will continue to monitor intake.    Nutrition Related Findings:    Last BM: Unknown. Edema: No data recorded. Pertinent Meds: Iron 325, latuda (FDI). Pertinent Labs 12/31: All labs reviewed wnl. Wound Type: None       Current Nutrition Intake & Therapies:    Average Meal Intake: 76-100% (Per pt report)  Average Supplements Intake: None Ordered  ADULT DIET; Easy to Chew    Anthropometric Measures:  Height: 160 cm (5' 2.99")  Ideal Body Weight (IBW): 115 lbs (52 kg)    Admission Body Weight: 73 kg (161 lb)  Current Body  Weight: 73 kg (161 lb), 140 % IBW. Weight Source: Bed Scale  Current BMI (kg/m2): 28.5  Usual Body Weight: 104.3 kg (230 lb)  % Weight Change (Calculated): -30  Weight Adjustment For: No Adjustment  BMI Categories: Overweight (BMI 25.0-29.9)    Estimated Daily Nutrient Needs:  Energy Requirements Based On: Formula  Weight Used for Energy Requirements: Current  Energy (kcal/day): 7893-8101 (MSJ 1.2-1.4)  Weight Used for Protein Requirements: Current  Protein (g/day): 54-73 (0.8-1)  Method Used for Fluid Requirements: 1 ml/kcal  Fluid (ml/day): 7510-2585    Nutrition Diagnosis:   Unintended weight loss related to psychological cause or life stress as evidenced by weight loss greater than or equal to 10% in 6 months  Biting/chewing (masticatory) difficulty related to partial or complete edentulism as evidenced by  (need of modified diet)    Nutrition Interventions:   Food and/or Nutrient Delivery: Continue Current Diet  Nutrition Education/Counseling: No recommendation at this time  Coordination of Nutrition Care: Continue to monitor while inpatient  Plan of Care discussed with: Pt    Goals:     Goals: Meet at least 75% of estimated needs, by next RD assessment       Nutrition Monitoring and Evaluation:   Behavioral-Environmental Outcomes: None Identified  Food/Nutrient Intake Outcomes: Food and Nutrient Intake  Physical Signs/Symptoms Outcomes: Biochemical Data, Chewing or Swallowing, Constipation, Diarrhea, GI Status, Nausea or Vomiting, Fluid Status  or Edema, Hemodynamic Status, Meal Time Behavior, Nutrition Focused Physical Findings, Skin, Weight    Discharge Planning:    Continue current diet     [place], [place]  Contact: [phone]

## 2022-05-18 NOTE — Progress Notes (Signed)
Critical INR of 5.2 called from lab. MD notified, and gave orders to hold Coumadin. Pt notified of above.

## 2022-05-18 NOTE — Group Note (Signed)
Art Therapy Group Progress Note    PATIENT SCHEDULED FOR GROUP AT:  1:00 PM     GROUP STOP TIME:  1:45 PM     ATTENDANCE: High (10/16 participants)    TOPIC / FOCUS: Celebrate the Small Things     GOALS: define accomplishment and celebration, identify small victories, list joyful experiences, increase positive thinking, increase self-esteem, practice gratitude, increase resiliency, increase experience of joy, promote creativity, encourage grounding techniques, practice mindfulness        PARTICIPATION LEVEL:   interactive, participated in art, contributed to group discussion, socializing     PARTICIPATION QUALITY:  appropriate, sharing, social     ATTENTION LEVEL: low focus (socializing with tablemate), moderate energy, low investment      LEVEL OF CONSCIOUSNESS: alert, oriented X 3     SPEECH: normal     THOUGHT PROCESS/ CONTENT: logical, concrete     AFFECTIVE FUNCTIONING: congruent     MOOD: depressed, tearful, euthymic     SYMBOLIC & THEMATIC CONTENT AS NOTED IN IMAGERY:  This writer encouraged the Pt to participate in group after completing their leisure assessment. Pt was tearful and depressed. This writer reminded the Pt that they could participate in group by scribbling on paper if the prompt felt too emotionally charged.     The Pt grabbed a blank sheet of paper and made large circular scribbles across the page in different colors. The PT then wrote "who" in large bubble letters and filled each letter in with a different color. Pt was first to share during group discussion that their shoes brought them joy. This writer encouraged the Pt to describe their joy more viscerally, so the group could understand the relief their shoes provided. The PT responded "you got me to say something positive."     STATUS AFTER INTERVENTION: unchanged     RESPONSE TO LEARNING: able to verbalize current knowledge/ experience, able to explain artwork, able to retain information    ENDINGS: none reported    MODES OF  INTERVENTION: exploration, art media, socialization, psychoeducation     DISCIPLINE RESPONSIBLE: Art Therapist     Titus Dubin  Art Therapist, MA

## 2022-05-19 LAB — PROTIME-INR
INR: 3.3 — ABNORMAL HIGH (ref 0.9–1.1)
Protime: 34.1 s — ABNORMAL HIGH (ref 11.9–14.7)

## 2022-05-19 MED ORDER — FLUTICASONE PROPIONATE 50 MCG/ACT NA SUSP
50 MCG/ACT | Freq: Every day | NASAL | Status: DC
Start: 2022-05-19 — End: 2022-05-23
  Administered 2022-05-20 – 2022-05-23 (×5): 1 via NASAL

## 2022-05-19 MED ORDER — PANTOPRAZOLE SODIUM 40 MG PO TBEC
40 MG | Freq: Every day | ORAL | Status: DC
Start: 2022-05-19 — End: 2022-05-23
  Administered 2022-05-20 – 2022-05-23 (×4): 40 mg via ORAL

## 2022-05-19 MED ORDER — TRAZODONE HCL 50 MG PO TABS
50 MG | Freq: Every evening | ORAL | Status: DC | PRN
Start: 2022-05-19 — End: 2022-05-20
  Administered 2022-05-20: 01:00:00 100 mg via ORAL

## 2022-05-19 MED ORDER — LURASIDONE HCL 40 MG PO TABS
40 MG | Freq: Every day | ORAL | Status: DC
Start: 2022-05-19 — End: 2022-05-23
  Administered 2022-05-20 – 2022-05-22 (×3): 40 mg via ORAL

## 2022-05-19 MED FILL — ACETAMINOPHEN 325 MG PO TABS: 325 MG | ORAL | Qty: 2

## 2022-05-19 MED FILL — PREGABALIN 50 MG PO CAPS: 50 MG | ORAL | Qty: 1

## 2022-05-19 MED FILL — METOPROLOL SUCCINATE ER 25 MG PO TB24: 25 MG | ORAL | Qty: 2

## 2022-05-19 MED FILL — PRAVASTATIN SODIUM 20 MG PO TABS: 20 MG | ORAL | Qty: 2

## 2022-05-19 MED FILL — TRAZODONE HCL 50 MG PO TABS: 50 MG | ORAL | Qty: 1

## 2022-05-19 MED FILL — LURASIDONE HCL 40 MG PO TABS: 40 MG | ORAL | Qty: 1

## 2022-05-19 MED FILL — ASPIRIN LOW DOSE 81 MG PO CHEW: 81 MG | ORAL | Qty: 1

## 2022-05-19 MED FILL — FERROUS SULFATE 325 (65 FE) MG PO TABS: 325 (65 Fe) MG | ORAL | Qty: 1

## 2022-05-19 MED FILL — HYDROXYZINE PAMOATE 25 MG PO CAPS: 25 MG | ORAL | Qty: 1

## 2022-05-19 MED FILL — LAMOTRIGINE 25 MG PO TABS: 25 MG | ORAL | Qty: 1

## 2022-05-19 MED FILL — JARDIANCE 10 MG PO TABS: 10 MG | ORAL | Qty: 1

## 2022-05-19 NOTE — Progress Notes (Signed)
Pt coming up to desk c/o anxiety 5/10. Pt received PRN Vistaril. Pt also received PRN Tylenol for generalized pain. Will continue to monitor for effectiveness.

## 2022-05-19 NOTE — Progress Notes (Signed)
SOCIAL WORK GROUP THERAPY PROGRESS NOTE    Group Time:  10:30am    Group Topic:  Coping Skills    Group Participation:     Pt was unavailable for group due to meeting with SW to review tx plan goals.

## 2022-05-19 NOTE — Plan of Care (Signed)
Problem: Pain  Goal: Verbalizes/displays adequate comfort level or baseline comfort level  Outcome: Progressing     Problem: Safety - Adult  Goal: Free from fall injury  Outcome: Progressing     Problem: ABCDS Injury Assessment  Goal: Absence of physical injury  Outcome: Progressing     Pt presents with dull affect, labile mood, tearful, fixated on food. Pt displays staff-splitting and attention-seeking behaviors, but was responsive to verbal redirection. Pt has been social on the unit. Pt denies SI/HI/AVH. Pt is medication compliant.

## 2022-05-19 NOTE — Group Note (Signed)
Group Therapy Note    Date: 05/19/2022    Group Start Time: 1500  Group End Time: 1600  Group Topic: Recreational      Mary [name], [place] Therapy Note    Attendees: 8/16      Group Type: Act It, Draw It, Describe It      Group Focus: Recreational therapy group involved group members identifying feelings and coping skills through acting, drawing, or descriptions. Patients discussed how to positively react to negative emoitions and cope with stressful life events. This group may enhance coping strategies, emotional awareness, mood, and decrease stress and anxiety.               Notes: At the start of group, patient moderately engaged in group, choosing to only engaging in discussions. Patient expressed wanting another emotional support animal, due to her last one passing away. Patient shared she enjoys cooking when she is stressed. Therapist observed patients energy increasing during group and encourage patient to play game, patient agreed but requested to describe a coping skill from he seat, stating " I don't like talking in front of other".      Status After Intervention:  Unchanged    Participation Level: Printmaker and Interactive    Participation Quality: Appropriate, Attentive, and Sharing      Speech:  normal      Thought Process/Content: Logical      Affective Functioning: Congruent      Mood: euthymic      Level of consciousness:  Alert and Attentive      Response to Learning: Able to verbalize current knowledge/experience      Endings: None Reported    Modes of Intervention: Socialization, Problem-solving, and Movement      Recreational Therapist  Fausto Sampedro Marcello [place]

## 2022-05-19 NOTE — Group Note (Signed)
Group Therapy Note    Date: 05/19/2022    Group Start Time: 1430  Group End Time: 1500  Group Topic: Psychoeducation        Mary [name]        Group Therapy Note    Attendees: 6  Group Topic- Discussing healthy physical and mental health strategies to implement in a balanced lifestyle.          Status After Intervention:  Unchanged    Participation Level: Active Listener and Interactive    Participation Quality: Appropriate, Attentive, and Sharing      Speech:  normal      Thought Process/Content: Logical      Affective Functioning: Congruent      Mood: euthymic      Level of consciousness:  Alert and Oriented x4      Response to Learning: Able to verbalize current knowledge/experience, Able to verbalize/acknowledge new learning, and Able to retain information      Endings: None Reported    Modes of Intervention: Education and Support      Discipline Responsible: Social Worker/Counselor      Signature:  Mary [name]

## 2022-05-19 NOTE — Group Note (Signed)
Group Therapy Note    Date: 05/19/2022    Group Start Time: 0500  Group End Time: 1610  Group Topic: Nursing    [place] [facility] SPECIAL TRTMT 1    Ross Bender, [place], RN; Marta Lamas, RN      Discharge Planning    Group Therapy Note    Attendees: 4       Patient's Goal:  to develop a safety plan    Notes:  PT's participated in developing their own personal safety plan    Status After Intervention:  Improved    Participation Level: Interactive    Participation Quality: Appropriate      Speech:  normal      Thought Process/Content: Logical  Linear      Affective Functioning: Congruent      Mood: elevated      Level of consciousness:  Alert, Oriented x4, and Attentive      Response to Learning: Able to verbalize/acknowledge new learning      Endings: None Reported    Modes of Intervention: Education, Support, and Activity      Discipline Responsible: Registered Nurse      Signature:  Su Grand, RN

## 2022-05-19 NOTE — Progress Notes (Signed)
Pt is a 57 year old female Bipolar Disorder , Borderline Personality Disorder Cannabis Use Disorder  and Cocaine use Disorder . Patient was admitted for an overdose of pills .     Pt.'s case was discussed in staffing. Presented tearful over her breakfast tray this morning. Pt was assisted with her issue. Pt has declined SA rehab. Pt is hoping someone can assist her with exploring Housing with supportive services.     SW Contact: SW met with pt to discuss dc planning. Pt expressed she feels tearful most of the time.  SW discussed and  provided pt with mental health education, discussed triggers and coping strategies. Pt. Is tearful, depressed and has little insight. Sw will continue to provide pt with support towards dc planning.     SW followed up on referrals to Supportive housing.    [facility] Currently has no beds.   Patient was referred to [facility] to [place] MA, LMHP-R

## 2022-05-19 NOTE — Progress Notes (Signed)
Behavioral Health Progress Note    Admit Date: 05/15/2022  Hospital day 3    Vitals : No data found.  Labs:    Recent Results (from the past 24 hour(s))   Protime-INR    Collection Time: 05/19/22  7:50 AM   Result Value Ref Range    Protime 34.1 (H) 11.9 - 14.7 sec    INR 3.3 (H) 0.9 - 1.1       Meds:   Current Facility-Administered Medications   Medication Dose Route Frequency    [START ON 05/20/2022] lurasidone (LATUDA) tablet 40 mg  40 mg Oral Dinner    [START ON 05/20/2022] pantoprazole (PROTONIX) tablet 40 mg  40 mg Oral QAM AC    fluticasone (FLONASE) 50 MCG/ACT nasal spray 1 spray  1 spray Each Nostril Daily    traZODone (DESYREL) tablet 100 mg  100 mg Oral Nightly PRN    methyl salicylate-menthol (BEN GAY GREASELESS) 10-15 % cream CREA   Apply externally TID PRN    nicotine polacrilex (COMMIT) lozenge 2 mg  2 mg Oral Q2H PRN    hydrOXYzine pamoate (VISTARIL) capsule 25 mg  25 mg Oral Q6H PRN    <<Patient OWN Medications are being Stored in the Inpatient Pharmacy, Pick up at time of Discharge>>   Other Prior to discharge    lamoTRIgine (LAMICTAL) tablet 25 mg  25 mg Oral Daily    ferrous sulfate (IRON 325) tablet 325 mg  325 mg Oral Daily with breakfast    ipratropium 0.5 mg-albuterol 2.5 mg (DUONEB) nebulizer solution 1 Dose  1 Dose Inhalation Q4H PRN    aspirin chewable tablet 81 mg  81 mg Oral Daily    empagliflozin (JARDIANCE) tablet 10 mg  10 mg Oral Daily    furosemide (LASIX) tablet 40 mg  40 mg Oral Daily PRN    metoprolol succinate (TOPROL XL) extended release tablet 50 mg  50 mg Oral Daily    pravastatin (PRAVACHOL) tablet 40 mg  40 mg Oral Nightly    pregabalin (LYRICA) capsule 200 mg  200 mg Oral TID    warfarin placeholder: dosing by pharmacy   Other RX Placeholder    acetaminophen (TYLENOL) tablet 650 mg  650 mg Oral Q4H PRN      Hospital Problems: Principal Problem:    Bipolar I disorder with depression, severe (HCC)  Active Problems:    DDD (degenerative disc disease), lumbosacral     Hyperlipidemia    Diabetes mellitus ([place])    Moderate to severe pulmonary hypertension (HCC)    Diastolic CHF, chronic ([place])    History of mitral valve replacement with bioprosthetic valve    Chronic obstructive pulmonary disease (HCC)    Afib (HCC)  Resolved Problems:    Acute depression      Subjective:   Medication side effects: none      Mental Status Exam  Sensorium: alert  Orientation: only aware of time, place, and person  Relations: cooperative  Eye Contact: appropriate  Appearance: shows no evidence of impairment  Thought Process: slow rate of thoughts, fair abstract reasoning/computation, and logical    Thought Content: no evidence of impairment   Suicidal: denies   Homicidal: denies   Mood: is anxious and unhappy   Affect: iritable  Memory: shows no evidence of impairment     Concentration: distractable  Abstraction: concrete  Insight: The patient shows little insight    OR Fair  Judgement: is psychologically impaired OR  Fair    Assessment/Plan:  not changed        Nurses report the patient is still complaining of unhappiness.  She is complaining of her diet saying she cannot eat it so nurses had offered soft diet.  She then began crying saying that they were only giving her mush that she needed to eat real food.  She was then placed back on diet and said it was acceptable.  She says she did not sleep at all last night.  Does not really feel any improvement with the use of the Latuda 20 mg at dinner.  I am going to go ahead and increase the dose of the Latuda to 40 mg at dinner to see if this will help with her mood.  I have also written to increase the trazodone to 100 mg at night to assist with sleep.  She complains of continued congestion and sinus pain.  She has been on Flonase in the past and we will write for this.  She continues to complain of right trigger thumb and says that she is supposed to be taking some type of cream for this.  We will see if we have some local medical salicylate cream.   She was also complaining of gastroesophageal reflux and had been on Prilosec.  We do not have Prilosec here at the hospital so we will temporarily write for omeprazole.  Continue supportive care.  Continue close observation,

## 2022-05-19 NOTE — Other (Signed)
SOCIAL WORK GROUP THERAPY  NOTE           Date: May 19, 2022     Group Time:  12:00 PM      Group Ended 12:45      [facility]     Group Topic: Hope    Patient Actively participated in group session today.    Group members Participation: 6    .Group Session: Hope is a positive emotion, and a belief. It brings peace because there is trust. Going through the season of pain, which is not forever, and joy can be experience again in your life. Hope is what we as humans need to have to  live life to its fullest.      Sohail Capraro, Social Worker, MSW. Supervisee

## 2022-05-19 NOTE — Behavioral Health Treatment Team (Signed)
Patient appeared to have slept for 6 hours throughout the night with no issues , will continue to monitor.

## 2022-05-19 NOTE — Group Note (Signed)
Group Therapy Note    Date: 05/19/2022    Group Start Time: 0930  Group End Time: 1030  Group Topic: Music Therapy      Jenny Reichmann        Group Therapy Note    Attendees: 8/16    Group Focus: Music therapy group consisted of psychoeducation on the creation of mood regulation playlists. The group created a playlist moving from the group's undesired mood state of tired/exhausted to their desired mood state of energetic/happy/upbeat. Group members assigned a Likert scale of 1-5 for the emotional content of the music, lyrics, and overall song to then order the playlist. The group may promote use of music as a tool to help regulate mood.       Notes:  Pt reported feeling "irritated" at the start of group and stated that her sleep medications were not working. Pt engaged in group through written exercise and sharing Likert scale rankings related to mood state of music and lyrics. Based on her participation, pt seemed to demonstrate understanding and openness to explore use of music and playlist making as a tool to regulate mood.    Status After Intervention:  Unchanged    Participation Level: Interactive    Participation Quality: Appropriate, Attentive, and Sharing      Speech:  normal      Thought Process/Content: Logical      Affective Functioning: Congruent      Mood: irritable      Level of consciousness:  Alert and Attentive      Response to Learning: Able to verbalize current knowledge/experience, Able to verbalize/acknowledge new learning, and Able to retain information      Endings: None Reported    Modes of Intervention: Support, Socialization, Exploration, and Media      Discipline Responsible: Social Worker/Counselor      Signature:  Jenny Reichmann, MT-BC, [place] Music Engineer, civil (consulting)

## 2022-05-20 LAB — PROTIME-INR
INR: 2.1 — ABNORMAL HIGH (ref 0.9–1.1)
Protime: 23.7 s — ABNORMAL HIGH (ref 11.9–14.7)

## 2022-05-20 MED ORDER — WARFARIN SODIUM 7.5 MG PO TABS
7.5 MG | Freq: Once | ORAL | Status: AC
Start: 2022-05-20 — End: 2022-05-20
  Administered 2022-05-20: 7.5 mg via ORAL

## 2022-05-20 MED ORDER — TRAZODONE HCL 50 MG PO TABS
50 MG | Freq: Every evening | ORAL | Status: DC | PRN
Start: 2022-05-20 — End: 2022-05-23
  Administered 2022-05-22 – 2022-05-23 (×2): 75 mg via ORAL

## 2022-05-20 MED FILL — HYDROXYZINE PAMOATE 25 MG PO CAPS: 25 MG | ORAL | Qty: 1

## 2022-05-20 MED FILL — PREGABALIN 50 MG PO CAPS: 50 MG | ORAL | Qty: 1

## 2022-05-20 MED FILL — FERROUS SULFATE 325 (65 FE) MG PO TABS: 325 (65 Fe) MG | ORAL | Qty: 1

## 2022-05-20 MED FILL — PRAVASTATIN SODIUM 20 MG PO TABS: 20 MG | ORAL | Qty: 2

## 2022-05-20 MED FILL — COUMADIN 7.5 MG PO TABS: 7.5 MG | ORAL | Qty: 1

## 2022-05-20 MED FILL — LAMOTRIGINE 25 MG PO TABS: 25 MG | ORAL | Qty: 1

## 2022-05-20 MED FILL — PANTOPRAZOLE SODIUM 40 MG PO TBEC: 40 MG | ORAL | Qty: 1

## 2022-05-20 MED FILL — ASPIRIN LOW DOSE 81 MG PO CHEW: 81 MG | ORAL | Qty: 1

## 2022-05-20 MED FILL — TRAZODONE HCL 50 MG PO TABS: 50 MG | ORAL | Qty: 2

## 2022-05-20 MED FILL — LURASIDONE HCL 40 MG PO TABS: 40 MG | ORAL | Qty: 1

## 2022-05-20 MED FILL — JANTOVEN 7.5 MG PO TABS: 7.5 MG | ORAL | Qty: 1

## 2022-05-20 MED FILL — METOPROLOL SUCCINATE ER 25 MG PO TB24: 25 MG | ORAL | Qty: 2

## 2022-05-20 MED FILL — JARDIANCE 10 MG PO TABS: 10 MG | ORAL | Qty: 1

## 2022-05-20 MED FILL — FLUTICASONE PROPIONATE 50 MCG/ACT NA SUSP: 50 MCG/ACT | NASAL | Qty: 16

## 2022-05-20 MED FILL — ACETAMINOPHEN 325 MG PO TABS: 325 MG | ORAL | Qty: 2

## 2022-05-20 NOTE — Other (Signed)
SOCIAL WORK GROUP THERAPY  NOTE           Date: May 20, 2022     Group Time:  12:00 PM      Group Ended 12:45      [facility]     Group Topic: Hope/ Part 2     Patient Actively Participate in Group session today.    Group members Participation: 5    Group Session: Hope is a positive emotion, and a belief. It brings peace because there is trust. Going through the season of pain, which is not forever, and joy can be experience again in your life. Hope is what we as humans need to have to  live life to its fullest.      Garfield Coiner, Social Worker, MSW. Supervisee

## 2022-05-20 NOTE — Plan of Care (Signed)
Problem: Pain  Goal: Verbalizes/displays adequate comfort level or baseline comfort level  Outcome: Progressing     Problem: Safety - Adult  Goal: Free from fall injury  05/20/2022 2251 by Jenness Corner, RN  Outcome: Progressing  05/20/2022 1358 by Haydee Salter, RN  Outcome: Progressing     Problem: Nutrition Deficit:  Goal: Optimize nutritional status  05/20/2022 1358 by Haydee Salter, RN  Outcome: Progressing  Flowsheets (Taken 05/20/2022 1352 by Scot Dock, RD)  Nutrient intake appropriate for improving, restoring, or maintaining nutritional needs:   Assess nutritional status and recommend course of action   Monitor oral intake, labs, and treatment plans   Recommend appropriate diets, oral nutritional supplements, and vitamin/mineral supplements   Pt is friendly, cooperative and medication compliant. Pt was isolated to room most of evening, only coming out for snack and medications. Pt presented with a blunt affect, flat facial expressions and a depressed mood. Pt did state she is experiencing a moderate level of anxiety but denies all SI/HI and AVH. Pt asked to take her PRN Trazodone and Vistaril and it was administered @ 2005. Pt is currently in rom asleep, will continue to monitor for safety and comfort.

## 2022-05-20 NOTE — Progress Notes (Signed)
Pt is a 57 year old female Bipolar Disorder , Borderline Personality Disorder Cannabis Use Disorder  and Cocaine use Disorder. Patient was admitted for an overdose of pills.      Pt.'s case was discussed in staffing. Pt required prn medication and mood continues to be unstable at times. Pt.'s medications will be adjusted. SW referred pt to [facility].  The provider is willing to accept pt on 05/23/22.      SW Contact: SW met with pt to discharge planning.  Pt. was provided an update about housing. Pt expressed concerns and anxiety based upon her most recent experience. SW provided supportive counseling to incorporate positive coping strategies to help minimize and decrease anxiety. SW provided pt with education about services: IOP, mental health skill building and medication management. Pt.'s mood is starting to improve. SW will continue to provide pt with support towards dc planning.         Raynelle Chary MA, LMHP-R

## 2022-05-20 NOTE — Plan of Care (Signed)
Problem: Pain  Goal: Verbalizes/displays adequate comfort level or baseline comfort level  05/20/2022 0607 by Sharmon Leyden, RN  Outcome: Progressing     Problem: Safety - Adult  Goal: Free from fall injury  05/20/2022 1358 by Haydee Salter, RN  Outcome: Progressing  05/20/2022 0607 by Sharmon Leyden, RN  Outcome: Progressing   Patient states that she is feeling better , c/o dry mouth., sipping on water . Patient compliant with medications. Attending groups. Denies SI. Will continue to monitor.

## 2022-05-20 NOTE — Progress Notes (Signed)
[facility] Dosing Services    Consult for Warfarin Dosing by Pharmacy by Dr. Quay Burow  Consult provided for this 57 y.o.  female , for indication of Heart Valve Replacement  Patient admitted for Suicide ideation    Dose to achieve an INR goal of 2.5 to 3.5  Home dose: 10 mg po on Tuesday and Saturday and then 7.5 mg po daily on All other days for a total of 57.5 mg/Week    Warfarin  10 (mg) to be given today at 18:00 per her home regimen    DATE INR DOSE   01/01 2.7 7.5 mg   01/02 3.4 10 mg   01/03 5.2 DOSE HELD   01/04 3.3 -   01/05 - 7.5 mg       Significant drug interactions: None  PT/INR Lab Results   Component Value Date/Time    INR 3.3 05/19/2022 07:50 AM    INR 2.2 04/07/2020 09:20 AM    INREXT 2.0 05/27/2021 12:00 AM       Platelets Lab Results   Component Value Date/Time    PLT 229 05/15/2022 06:00 PM      H/H Lab Results   Component Value Date/Time    HGB 12.9 05/15/2022 06:00 PM        Pharmacy to follow daily and will provide subsequent Warfarin dosing based on clinical status.

## 2022-05-20 NOTE — Consults (Signed)
Nutrition Note       Nutrition Recommendations/Plan:   Continue current diet.   Continue to monitor intake/tolerance of meals and POC while admitted.       Consult noted- pt requested, with comments "pt c/o food being difficult to chew 05/18/22- diet switched to easy to chew- pt now requesting different food options." Per MD note 1/04- pt upset with diet, saying it was "mush" and she wanted "real food." Note diet advanced back to regular with double protein portions 1/4. Visited pt on unit- endorsed tolerating regular texture since diet advanced. No further preferences expressed. Will continue to monitor.     Electronically signed by Scot Dock, RD on 05/20/22 at 1:43 PM EST    Contact: [phone]

## 2022-05-20 NOTE — Group Note (Signed)
Group Therapy Note    Date: 05/20/2022    Group Start Time: 0930  Group End Time: 5397  Group Topic: Music Therapy      Jenny Reichmann        Group Therapy Note    Attendees: 10/16    Group Focus: Music therapy group explored the themes of overcoming obstacles and hope for the day ahead utilizing the interventions of lyric analysis and a collaborative group songwriting exercise. Group members worked together to [employer] to a well-known song while processing the lyrics they were contributing. The group's newly created song was then sung back with instruments. Some group members also wrote their own individual song lyrics which were shared as well. The group may promote self-expression, socialization, self-efficacy, improve mood, and promote use of music as a coping skill.       Notes:  Pt contributed several lyrics to the group's song including the lyrics, "I can no longer avoid to be gullable." Pt discussed situation that related to her song lyric. Pt discussed her food being better today and eating a nice breakfast, and described how it was better than the "mush" from yesterday's breakfast. Pt's affect brightened over the course of group.    Status After Intervention:  Unchanged    Participation Level: Interactive    Participation Quality: Appropriate, Attentive, and Sharing      Speech:  normal      Thought Process/Content: Logical      Affective Functioning: Congruent      Mood: depressed and euthymic      Level of consciousness:  Alert and Attentive      Response to Learning: Able to verbalize current knowledge/experience and Able to verbalize/acknowledge new learning      Endings: None Reported    Modes of Intervention: Support, Socialization, Exploration, and Media      Discipline Responsible: Social Worker/Counselor      Signature:  Jenny Reichmann, MT-BC, [place] Music Engineer, civil (consulting)

## 2022-05-20 NOTE — Group Note (Signed)
Group Therapy Note    Date: 05/20/2022    Group Start Time: 1300  Group End Time: 7371  Group Topic: Music Therapy      Jenny Reichmann        Group Therapy Note    Attendees: 8/12    Group Focus: Music therapy group focused on intention setting for the year ahead and upon discharge. Group members were guided through a music-assisted relaxation to identify one word or a few words as intentions, and then shared their intentions with the support of therapeutic drumming. The group closed with group singing.          Notes:  Pt reported feeling "tired" at the start of group. Pt identified her intention as "self-sufficiency." Pt discussed how she would like to get her own place and not live with others. Pt engaged in music making in group with moderate energy. Pt left group early.    Status After Intervention:  Unchanged    Participation Level: Interactive    Participation Quality: Appropriate, Attentive, and Sharing      Speech:  normal      Thought Process/Content: Logical      Affective Functioning: Congruent      Mood: euthymic      Level of consciousness:  Alert and Attentive      Response to Learning: Able to verbalize current knowledge/experience and Able to verbalize/acknowledge new learning      Endings: None Reported    Modes of Intervention: Support, Socialization, Exploration, and Media      Discipline Responsible: Social Worker/Counselor      Signature:  Jenny Reichmann, MT-BC, [place] Music Engineer, civil (consulting)

## 2022-05-20 NOTE — Plan of Care (Signed)
Problem: Pain  Goal: Verbalizes/displays adequate comfort level or baseline comfort level  Outcome: Progressing     Problem: Safety - Adult  Goal: Free from fall injury  Outcome: Progressing     Problem: ABCDS Injury Assessment  Goal: Absence of physical injury  Outcome: Progressing     Problem: Nutrition Deficit:  Goal: Optimize nutritional status  Outcome: Progressing     .Pt received in dayroom, alert and oriented x4, pt is cooperative and interacts well with peers. Rates depression 6/10, denies anxiety. Pt denies any SI/HI and denies any hallucinations. Pt is eating, toileting and sleeping well; pt is compliant with medications. Stressed the importance of medication compliance and positive coping skills. No aggression noted at this time.  Q15 minute checks ongoing. Staff will continue to maintain a safe and therapeutic environment.

## 2022-05-20 NOTE — Progress Notes (Signed)
SOCIAL WORK GROUP THERAPY PROGRESS NOTE    Group Time:  10:15am       Group Topic:  Coping Skills    C D Issues    Group Participation:      Pt minimally involved during group discussion but seemed at times distracted by internal stimuli. Still dysphoric & anxious.     Did handout on  x25 ways to be better in managing "anxiety" and "depression".    Pt admitted needs to do better setting limits & not allow others to take advantage of her..    Differences between Assertive, Aggressive & Non-Assertive Behaviors

## 2022-05-20 NOTE — Behavioral Health Treatment Team (Signed)
Patient slept approximately 6 hours.

## 2022-05-20 NOTE — Progress Notes (Signed)
Behavioral Health Progress Note    Admit Date: 05/15/2022  Hospital day 4    Vitals : Patient Vitals for the past 8 hrs:   BP Temp Temp src Pulse Resp SpO2   05/20/22 0830 118/67 98.1 F (36.7 C) Oral 66 18 100 %     Labs:    Recent Results (from the past 24 hour(s))   Protime-INR    Collection Time: 05/20/22  7:09 AM   Result Value Ref Range    Protime 23.7 (H) 11.9 - 14.7 sec    INR 2.1 (H) 0.9 - 1.1       Meds:   Current Facility-Administered Medications   Medication Dose Route Frequency    warfarin (COUMADIN) tablet 7.5 mg  7.5 mg Oral Once    lurasidone (LATUDA) tablet 40 mg  40 mg Oral Dinner    pantoprazole (PROTONIX) tablet 40 mg  40 mg Oral QAM AC    fluticasone (FLONASE) 50 MCG/ACT nasal spray 1 spray  1 spray Each Nostril Daily    traZODone (DESYREL) tablet 100 mg  100 mg Oral Nightly PRN    methyl salicylate-menthol (BEN GAY GREASELESS) 10-15 % cream CREA   Apply externally TID PRN    nicotine polacrilex (COMMIT) lozenge 2 mg  2 mg Oral Q2H PRN    hydrOXYzine pamoate (VISTARIL) capsule 25 mg  25 mg Oral Q6H PRN    <<Patient OWN Medications are being Stored in the Inpatient Pharmacy, Pick up at time of Discharge>>   Other Prior to discharge    lamoTRIgine (LAMICTAL) tablet 25 mg  25 mg Oral Daily    ferrous sulfate (IRON 325) tablet 325 mg  325 mg Oral Daily with breakfast    ipratropium 0.5 mg-albuterol 2.5 mg (DUONEB) nebulizer solution 1 Dose  1 Dose Inhalation Q4H PRN    aspirin chewable tablet 81 mg  81 mg Oral Daily    empagliflozin (JARDIANCE) tablet 10 mg  10 mg Oral Daily    furosemide (LASIX) tablet 40 mg  40 mg Oral Daily PRN    metoprolol succinate (TOPROL XL) extended release tablet 50 mg  50 mg Oral Daily    pravastatin (PRAVACHOL) tablet 40 mg  40 mg Oral Nightly    pregabalin (LYRICA) capsule 200 mg  200 mg Oral TID    warfarin placeholder: dosing by pharmacy   Other RX Placeholder    acetaminophen (TYLENOL) tablet 650 mg  650 mg Oral Q4H PRN      Hospital Problems: Principal Problem:     Bipolar I disorder with depression, severe (HCC)  Active Problems:    DDD (degenerative disc disease), lumbosacral    Hyperlipidemia    Diabetes mellitus (HCC)    Moderate to severe pulmonary hypertension (HCC)    Diastolic CHF, chronic (HCC)    History of mitral valve replacement with bioprosthetic valve    Chronic obstructive pulmonary disease (HCC)    Afib (HCC)  Resolved Problems:    Acute depression      Subjective:   Medication side effects: none      Mental Status Exam  Sensorium: alert  Orientation: only aware of time, place, and person  Relations: cooperative  Eye Contact: appropriate  Appearance: shows no evidence of impairment  Thought Process: normal rate of thoughts, fair abstract reasoning/computation, and logical    Thought Content: no evidence of impairment   Suicidal: denies   Homicidal: denies   Mood: unhappy   Affect: stable  Memory: shows no evidence of  impairment     Concentration: distractable  Abstraction: concrete  Insight: The patient shows little insight    OR Fair  Judgement: is psychologically impaired OR  Fair    Assessment/Plan:   not changed    Continue close observation,    There is report that the patient has been complaining of dry mouth.  She is too sleepy this morning and the trazodone appears to have worked well for sleeping but she is still a little too tired with it so we will cut 75 mg.  Social work service is working on placement including follow-up with [employer], housing cooks community [employer] and residential home on [address].  She does need Chapstick as needed.  We have increased the dose of Latuda should start today and should help with the mood.

## 2022-05-20 NOTE — Group Note (Signed)
Group Therapy Note    Date: 05/20/2022    Group Start Time: 1500  Group End Time: 1062  Group Topic: Recreational        Mary [name], [name] Therapy Note      Attendees: 5/10    Group Type: Connect More     Group Focus: Recreational therapist engaged patients in meaningful conversation that encourages players to reflect on their opinions, feelings, thoughts and goals. This group may help improve social skills, self-reflection, emotional skills, self-control and self-resilience.                 Notes:  Patient actively engaged in discussion. Patient expressed memories she treasured with her grandma cooking. Patient expressed one thing she wish she could do less is smoke cigarettes and drugs. Therapist noticed patient sharing more than she has in previous groups.     Status After Intervention:  Unchanged    Participation Level: Active Listener and Interactive    Participation Quality: Appropriate, Attentive, and Sharing      Speech:  normal      Thought Process/Content: Logical      Affective Functioning: Congruent      Mood: euthymic      Level of consciousness:  Alert and Attentive      Response to Learning: Able to verbalize current knowledge/experience      Endings: None Reported    Modes of Intervention: Socialization      Recreational Therapist  Ludene Stokke Marcello Moores

## 2022-05-21 LAB — PROTIME-INR
INR: 1.3 — ABNORMAL HIGH (ref 0.9–1.1)
Protime: 16.4 s — ABNORMAL HIGH (ref 11.9–14.7)

## 2022-05-21 LAB — TROPONIN: Troponin, High Sensitivity: 5 ng/L (ref 0–54)

## 2022-05-21 MED ORDER — IBUPROFEN 400 MG PO TABS
400 | Freq: Four times a day (QID) | ORAL | Status: DC | PRN
Start: 2022-05-21 — End: 2022-05-23

## 2022-05-21 MED ORDER — LIDOCAINE 4 % EX PTCH
4 % | Freq: Every day | CUTANEOUS | Status: AC | PRN
Start: 2022-05-21 — End: 2022-05-23
  Administered 2022-05-21: 20:00:00 1 via TRANSDERMAL

## 2022-05-21 MED ORDER — WARFARIN SODIUM 10 MG PO TABS
10 MG | Freq: Once | ORAL | Status: AC
Start: 2022-05-21 — End: 2022-05-21
  Administered 2022-05-21: 10 mg via ORAL

## 2022-05-21 MED FILL — FERROUS SULFATE 325 (65 FE) MG PO TABS: 325 (65 Fe) MG | ORAL | Qty: 1

## 2022-05-21 MED FILL — PREGABALIN 50 MG PO CAPS: 50 MG | ORAL | Qty: 1

## 2022-05-21 MED FILL — PANTOPRAZOLE SODIUM 40 MG PO TBEC: 40 MG | ORAL | Qty: 1

## 2022-05-21 MED FILL — FLUTICASONE PROPIONATE 50 MCG/ACT NA SUSP: 50 MCG/ACT | NASAL | Qty: 16

## 2022-05-21 MED FILL — JANTOVEN 10 MG PO TABS: 10 MG | ORAL | Qty: 1

## 2022-05-21 MED FILL — HYDROXYZINE PAMOATE 25 MG PO CAPS: 25 MG | ORAL | Qty: 1

## 2022-05-21 MED FILL — ACETAMINOPHEN 325 MG PO TABS: 325 MG | ORAL | Qty: 2

## 2022-05-21 MED FILL — METOPROLOL SUCCINATE ER 25 MG PO TB24: 25 MG | ORAL | Qty: 2

## 2022-05-21 MED FILL — PRAVASTATIN SODIUM 20 MG PO TABS: 20 MG | ORAL | Qty: 2

## 2022-05-21 MED FILL — JARDIANCE 10 MG PO TABS: 10 MG | ORAL | Qty: 1

## 2022-05-21 MED FILL — LAMOTRIGINE 25 MG PO TABS: 25 MG | ORAL | Qty: 1

## 2022-05-21 MED FILL — LURASIDONE HCL 40 MG PO TABS: 40 MG | ORAL | Qty: 1

## 2022-05-21 MED FILL — ASPIRIN LOW DOSE 81 MG PO CHEW: 81 MG | ORAL | Qty: 1

## 2022-05-21 MED FILL — LIDOCAINE PAIN RELIEF 4 % EX PTCH: 4 % | CUTANEOUS | Qty: 1

## 2022-05-21 NOTE — Plan of Care (Signed)
Problem: Pain  Goal: Verbalizes/displays adequate comfort level or baseline comfort level  Outcome: Progressing     Problem: Safety - Adult  Goal: Free from fall injury  Outcome: Progressing     Problem: ABCDS Injury Assessment  Goal: Absence of physical injury  Outcome: Progressing     Patient presents calm/cooperative with all phases of care.  Patient denies all SI/HI and AVH.  Patient has been visible out in milieu majority of shift this and mid-afternoon, she began complaining of recurrent chest pain after using the restroom. Patient states she had a bowel movement. At this time, the patient's vitals were stable.  Her heart rate was bradycardic which is not uncommon for this patient. Due to her cardiac history a RRT was called.  Patient was evaluated on unit, a STAT EKG performed, and Troponin level drawn.  Patient remains stable with no further incidents of chest pain.

## 2022-05-21 NOTE — Significant Event (Signed)
Patient informed [facility] that she was experiencing chest pain while sitting out in the dayroom. MHT then came and informed the nurse.  This Probation officer requested a full set of vitals and called the patient's MD.  This Probation officer provided the MD with the patient's vital signs: BP 129/66 and heart rate of 54. Patient describes her chest pain as a sharp persistent pain that is localized near the L center of her chest.  Patient states the pain does not radiate anywhere else. MD came up on unit and evaluated patient further.  Due to the patient's cardiac history and persistence of pain, a RRT was called.  The rapid response was called at 12:14pm and the patient was assessed further.  A STAT EKG was ordered along with a troponin level.  The patient will remain on unit at this time.

## 2022-05-21 NOTE — Progress Notes (Signed)
SUBJECTIVE:    Mary [name] chart was reviewed, she was discussed with nursing staff, and she was seen during rounds. Per nursing staff, the patient slept 7.5hrs overnight. She has been compliant with her medications. During rounds, the patient reports tolerating her medications fairly. She continues to endorse dry mouth. The patient reports feeling anxious about discharge. She reports worries that she will not be able to smoke at her group home. She also reports concerns as she does not have any other clothing.     Following initial interview, the patient c/o chest pain following bowel movement. She described it as 7 out of 10, intermittent, non-radiating, left-sided chest pain.     OBJECTIVE      Medications  Current Facility-Administered Medications: warfarin (COUMADIN) tablet 10 mg, 10 mg, Oral, Once  lidocaine 4 % external patch 1 patch, 1 patch, TransDERmal, Daily PRN  ibuprofen (ADVIL;MOTRIN) tablet 400 mg, 400 mg, Oral, Q6H PRN  traZODone (DESYREL) tablet 75 mg, 75 mg, Oral, Nightly PRN  lurasidone (LATUDA) tablet 40 mg, 40 mg, Oral, Dinner  pantoprazole (PROTONIX) tablet 40 mg, 40 mg, Oral, QAM AC  fluticasone (FLONASE) 50 MCG/ACT nasal spray 1 spray, 1 spray, Each Nostril, Daily  methyl salicylate-menthol (BEN GAY GREASELESS) 10-15 % cream CREA, , Apply externally, TID PRN  nicotine polacrilex (COMMIT) lozenge 2 mg, 2 mg, Oral, Q2H PRN  hydrOXYzine pamoate (VISTARIL) capsule 25 mg, 25 mg, Oral, Q6H PRN  <<Patient OWN Medications are being Stored in the Inpatient Pharmacy, Pick up at time of Discharge>>, , Other, Prior to discharge  lamoTRIgine (LAMICTAL) tablet 25 mg, 25 mg, Oral, Daily  ferrous sulfate (IRON 325) tablet 325 mg, 325 mg, Oral, Daily with breakfast  ipratropium 0.5 mg-albuterol 2.5 mg (DUONEB) nebulizer solution 1 Dose, 1 Dose, Inhalation, Q4H PRN  aspirin chewable tablet 81 mg, 81 mg, Oral, Daily  empagliflozin (JARDIANCE) tablet 10 mg, 10 mg, Oral, Daily  furosemide (LASIX) tablet 40 mg,  40 mg, Oral, Daily PRN  metoprolol succinate (TOPROL XL) extended release tablet 50 mg, 50 mg, Oral, Daily  pravastatin (PRAVACHOL) tablet 40 mg, 40 mg, Oral, Nightly  pregabalin (LYRICA) capsule 200 mg, 200 mg, Oral, TID  warfarin placeholder: dosing by pharmacy, , Other, RX Placeholder  acetaminophen (TYLENOL) tablet 650 mg, 650 mg, Oral, Q4H PRN     Physical  BP 119/75   Pulse 58   Temp 97.8 F (36.6 C) (Oral)   Resp 16   Ht 1.6 m (5' 2.99")   Wt 73 kg (161 lb)   SpO2 95%   BMI 28.53 kg/m     Mental Status Examination:    Level of consciousness:  Alert  Appearance: Appropriately groomed; appropriate eye contact  Behavior/Motor: No abnormal movements noted  Attitude toward examiner:  Cooperative  Speech:  Normal in rate, volume and tone  Mood:  Mood is, "Scared."   Affect:  Full.  Thought processes:  Logical; goal-directed  Thought content: Denies SI and HI; denies psychosis; does not appear to respond to internal stimuli  Insight:  Fair  Judgment:  Appears fair at best      ASSESSMENT  Bipolar I disorder with depression, severe (HCC)     PLAN  Acute chest pain-Rapid response called. EKG completed and is consistent with previous EKG. Troponin x1 ordered per recommendation. Topical and oral pain meds recommended.   Bipolar 1 disorder-Continue Latuda 40mg  daily with dinner and Lamictal 25mg  daily.   Continue meds for physical health.   Continue hospitalization.

## 2022-05-21 NOTE — Behavioral Health Treatment Team (Addendum)
Patient appeared to have slept 7 1/2 hrs. Patient states last night was the best and the longest she slept in along time and she retired to bed early last night.

## 2022-05-21 NOTE — Plan of Care (Signed)
Problem: Safety - Adult  Goal: Free from fall injury  05/21/2022 2140 by Lincoln Brigham, RN  Outcome: Progressing  05/21/2022 1813 by Elisha Ponder, RN  Outcome: Progressing     Problem: Nutrition Deficit:  Goal: Optimize nutritional status  Outcome: Progressing     Problem: Pain  Goal: Verbalizes/displays adequate comfort level or baseline comfort level  05/21/2022 2140 by Lincoln Brigham, RN  Outcome: Progressing  05/21/2022 1813 by Elisha Ponder, RN  Outcome: Progressing     Pt has been pleasant and cooperative.  Denies si/hi avh, has no complaints of chest pain.  Was compliant w/ hs scheduled medications and ate hs snack.  Observed interacting appropriately w/ peers.  Will continue to monitor/support

## 2022-05-21 NOTE — Progress Notes (Signed)
[place]  Rapid/Medical Response Team Note    Patient:    Mary [name] 57 y.o. female, DOB: 12-12-1965  Patient MRN: [id number]    Admission Date: 05/15/2022   Admission Diagnosis: Suicidal ideation [R45.851]  Homelessness [Z59.00]  Acute depression [F32.A]    RAPID RESPONSE  Rapid response called for: Chest Pain     OBJECTIVE  RRT/MRT start time:       56        RRT/MRT end time: 1235  Vitals:    05/21/22 0812   BP: 119/75   Pulse: 58   Resp: 16   Temp: 97.8 F (36.6 C)   SpO2: 95%      Physical Exam:  Gen: NAD, comfortable  HEENT: normocephalic, atraumatic  CV: Normal rate and regular rhythm, S1/S2 present without M/R/G, pain with palpation to chest wall   Pulm: CTAB, no wheezes, no crackles  Skin: warm, dry, intact, no rash  Neuro: CN II-XII grossly intact, no focal deficits appreciated   Psych: appropriate, alert    ASSESSMENT/PLAN AND DISPOSITION  Mary [name] is a 57 y.o. year old female admitted for Suicidal ideation [R45.851]  Homelessness [Z59.00]  Acute depression [F32.A]  Rapid Response Team/ Medical Response Team Called For: Chest pain    In setting of Chest Pain completed the below:    Medications Administered During Rapid: None    EKG: Normal sinus rhythm, inverted T waves unchanged from prior.     Labs: Troponin x1     Imaging: None    Other interventions: None      Medical Problem(s)  Chest pain reproducible on exam without any acute EKG findings.     Plan:  - Pain control with Tylenol, Ibuprofen, and lidocaine patch prn   - Troponin x1     Disposition: Stable remain in behavioral health unit  Patient condition: stable    Attending Dr. Elizabeth Palau notified of rapid response and is in agreement with plan.     Primary team resuming care.     [facility] Senior resident Dr. Eugenie Birks present during Rapid Response.    Nira Retort, MD -PGY 1  [facility]  May 21, 2022 12:38 PM

## 2022-05-22 LAB — EKG 12-LEAD
Atrial Rate: 55 {beats}/min
P Axis: 80 degrees
P-R Interval: 158 ms
Q-T Interval: 438 ms
QRS Duration: 90 ms
QTc Calculation (Bazett): 419 ms
R Axis: 34 degrees
T Axis: 53 degrees
Ventricular Rate: 55 {beats}/min

## 2022-05-22 MED ORDER — WARFARIN SODIUM 7.5 MG PO TABS
7.5 MG | Freq: Once | ORAL | Status: AC
Start: 2022-05-22 — End: 2022-05-22
  Administered 2022-05-22: 22:00:00 7.5 mg via ORAL

## 2022-05-22 MED FILL — HYDROXYZINE PAMOATE 25 MG PO CAPS: 25 MG | ORAL | Qty: 1

## 2022-05-22 MED FILL — LURASIDONE HCL 40 MG PO TABS: 40 MG | ORAL | Qty: 1

## 2022-05-22 MED FILL — ACETAMINOPHEN 325 MG PO TABS: 325 MG | ORAL | Qty: 2

## 2022-05-22 MED FILL — JARDIANCE 10 MG PO TABS: 10 MG | ORAL | Qty: 1

## 2022-05-22 MED FILL — PREGABALIN 50 MG PO CAPS: 50 MG | ORAL | Qty: 1

## 2022-05-22 MED FILL — METOPROLOL SUCCINATE ER 25 MG PO TB24: 25 MG | ORAL | Qty: 2

## 2022-05-22 MED FILL — JANTOVEN 7.5 MG PO TABS: 7.5 MG | ORAL | Qty: 1

## 2022-05-22 MED FILL — ASPIRIN LOW DOSE 81 MG PO CHEW: 81 MG | ORAL | Qty: 1

## 2022-05-22 MED FILL — PRAVASTATIN SODIUM 20 MG PO TABS: 20 MG | ORAL | Qty: 2

## 2022-05-22 MED FILL — TRAZODONE HCL 50 MG PO TABS: 50 MG | ORAL | Qty: 2

## 2022-05-22 MED FILL — FERROUS SULFATE 325 (65 FE) MG PO TABS: 325 (65 Fe) MG | ORAL | Qty: 1

## 2022-05-22 MED FILL — PANTOPRAZOLE SODIUM 40 MG PO TBEC: 40 MG | ORAL | Qty: 1

## 2022-05-22 MED FILL — LAMOTRIGINE 25 MG PO TABS: 25 MG | ORAL | Qty: 1

## 2022-05-22 NOTE — Progress Notes (Signed)
SUBJECTIVE:    Mary [name] chart was reviewed, she was discussed with nursing staff, and she was seen during rounds. No acute events overnight. The patient required PRN medications for anxiety because of a peer being disruptive. During rounds, the patient continues to endorse anxiety regarding disposition after discharge however, she reports a desire to be discharged on tomorrow. She slept well with good appetite. She denies episodes of chest pain overnight. She denies SI and HI.     OBJECTIVE      Medications  Current Facility-Administered Medications: warfarin (COUMADIN) tablet 7.5 mg, 7.5 mg, Oral, Once  lidocaine 4 % external patch 1 patch, 1 patch, TransDERmal, Daily PRN  ibuprofen (ADVIL;MOTRIN) tablet 400 mg, 400 mg, Oral, Q6H PRN  traZODone (DESYREL) tablet 75 mg, 75 mg, Oral, Nightly PRN  lurasidone (LATUDA) tablet 40 mg, 40 mg, Oral, Dinner  pantoprazole (PROTONIX) tablet 40 mg, 40 mg, Oral, QAM AC  fluticasone (FLONASE) 50 MCG/ACT nasal spray 1 spray, 1 spray, Each Nostril, Daily  methyl salicylate-menthol (BEN GAY GREASELESS) 10-15 % cream CREA, , Apply externally, TID PRN  nicotine polacrilex (COMMIT) lozenge 2 mg, 2 mg, Oral, Q2H PRN  hydrOXYzine pamoate (VISTARIL) capsule 25 mg, 25 mg, Oral, Q6H PRN  <<Patient OWN Medications are being Stored in the Inpatient Pharmacy, Pick up at time of Discharge>>, , Other, Prior to discharge  lamoTRIgine (LAMICTAL) tablet 25 mg, 25 mg, Oral, Daily  ferrous sulfate (IRON 325) tablet 325 mg, 325 mg, Oral, Daily with breakfast  ipratropium 0.5 mg-albuterol 2.5 mg (DUONEB) nebulizer solution 1 Dose, 1 Dose, Inhalation, Q4H PRN  aspirin chewable tablet 81 mg, 81 mg, Oral, Daily  empagliflozin (JARDIANCE) tablet 10 mg, 10 mg, Oral, Daily  furosemide (LASIX) tablet 40 mg, 40 mg, Oral, Daily PRN  metoprolol succinate (TOPROL XL) extended release tablet 50 mg, 50 mg, Oral, Daily  pravastatin (PRAVACHOL) tablet 40 mg, 40 mg, Oral, Nightly  pregabalin (LYRICA) capsule 200  mg, 200 mg, Oral, TID  warfarin placeholder: dosing by pharmacy, , Other, RX Placeholder  acetaminophen (TYLENOL) tablet 650 mg, 650 mg, Oral, Q4H PRN     Labs  Tropinin level WNL at 5      Physical  BP 123/74   Pulse 58   Temp 97.3 F (36.3 C) (Oral)   Resp 16   Ht 1.6 m (5' 2.99")   Wt 73 kg (161 lb)   SpO2 95%   BMI 28.53 kg/m     Mental Status Examination:    Level of consciousness:  Alert  Appearance: Appropriately groomed; appropriate eye contact  Behavior/Motor: No abnormal movements noted  Attitude toward examiner:  Cooperative  Speech:  Normal in rate, volume and tone  Mood:  Mood is, "all right."   Affect:  Full  Thought processes:  Logical; focused on anxiety about discharge  Thought content: Denies SI and HI; denies psychosis; does not appear to respond to internal stimuli  Insight:  Fair at best  Judgment:  Appears fair     ASSESSMENT  Bipolar I disorder with depression, severe (HCC)     PLAN  Bipolar 1 disorder-Continue Latuda 40mg  daily with dinner and Lamictal 25mg  daily.   Continue meds for physical health.   Continue hospitalization.

## 2022-05-22 NOTE — Behavioral Health Treatment Team (Signed)
Patient noted to have slept for approximately 7.5 hours.

## 2022-05-22 NOTE — Plan of Care (Signed)
Problem: Pain  Goal: Verbalizes/displays adequate comfort level or baseline comfort level  Outcome: Progressing     Problem: Safety - Adult  Goal: Free from fall injury  05/22/2022 2155 by Lincoln Brigham, RN  Outcome: Progressing  05/22/2022 1126 by Elisha Ponder, RN  Outcome: Progressing     Problem: ABCDS Injury Assessment  Goal: Absence of physical injury  05/22/2022 2155 by Lincoln Brigham, RN  Outcome: Progressing  05/22/2022 1126 by Elisha Ponder, RN  Outcome: Progressing     Problem: Nutrition Deficit:  Goal: Optimize nutritional status  05/22/2022 1126 by Elisha Ponder, RN  Outcome: Progressing       Pt has been pleasant and cooperative.  Denies si/hi avh,.  has been resting in her room for most of the shift. Was compliant w/ hs scheduled medications and ate hs snack.  Observed interacting appropriately w/ peers.  Will continue to monitor/support

## 2022-05-22 NOTE — Plan of Care (Signed)
Problem: Safety - Adult  Goal: Free from fall injury  05/22/2022 1126 by Elisha Ponder, RN  Outcome: Progressing     Problem: ABCDS Injury Assessment  Goal: Absence of physical injury  Outcome: Progressing     Problem: Nutrition Deficit:  Goal: Optimize nutritional status  05/22/2022 1126 by Elisha Ponder, RN  Outcome: Progressing  Received patient out in dayroom sitting calmly watching TV. Patient is pleasant upon approach.  She denies SI/HI and AVH; her affect is flat and mood is congruent.  Patient remains med/meal compliant.  No behavioral issues noted.  Patient states she is anxious about being discharged and transitioning to [place].  PRN Vistaril 25 mg provided for anxiety.

## 2022-05-22 NOTE — Progress Notes (Signed)
Patient requested anxiety medication with her morning scheduled medications.  PRN Hydroxyzine 25 mg administered. Will monitor for efficacy.

## 2022-05-23 LAB — PROTIME-INR
INR: 2.9 — ABNORMAL HIGH (ref 0.9–1.1)
Protime: 30.5 s — ABNORMAL HIGH (ref 11.9–14.7)

## 2022-05-23 MED ORDER — WARFARIN SODIUM 2.5 MG PO TABS
2.5 MG | ORAL_TABLET | Freq: Every day | ORAL | 0 refills | Status: AC
Start: 2022-05-23 — End: 2022-05-25

## 2022-05-23 MED ORDER — FLUTICASONE PROPIONATE 50 MCG/ACT NA SUSP
50 MCG/ACT | Freq: Every day | NASAL | 0 refills | Status: AC
Start: 2022-05-23 — End: ?

## 2022-05-23 MED ORDER — ALBUTEROL SULFATE HFA 108 (90 BASE) MCG/ACT IN AERS
108 (90 Base) MCG/ACT | Freq: Four times a day (QID) | RESPIRATORY_TRACT | 0 refills | Status: AC | PRN
Start: 2022-05-23 — End: ?

## 2022-05-23 MED ORDER — LIDOCAINE 4 % EX PTCH
4 % | MEDICATED_PATCH | Freq: Every day | CUTANEOUS | 0 refills | Status: AC | PRN
Start: 2022-05-23 — End: ?

## 2022-05-23 MED ORDER — LAMOTRIGINE 25 MG PO TABS
25 MG | ORAL_TABLET | Freq: Every day | ORAL | 0 refills | Status: AC
Start: 2022-05-23 — End: 2022-07-08

## 2022-05-23 MED ORDER — DSS 100 MG PO CAPS
100 MG | ORAL_CAPSULE | Freq: Every day | ORAL | 0 refills | Status: AC
Start: 2022-05-23 — End: ?

## 2022-05-23 MED ORDER — ALBUTEROL SULFATE HFA 108 (90 BASE) MCG/ACT IN AERS
10890 (90 Base) MCG/ACT | Freq: Four times a day (QID) | RESPIRATORY_TRACT | Status: DC | PRN
Start: 2022-05-23 — End: 2022-05-23

## 2022-05-23 MED ORDER — METOPROLOL SUCCINATE ER 50 MG PO TB24
50 MG | ORAL_TABLET | Freq: Every day | ORAL | 0 refills | Status: AC
Start: 2022-05-23 — End: 2022-10-07

## 2022-05-23 MED ORDER — EMPAGLIFLOZIN 10 MG PO TABS
10 MG | ORAL_TABLET | Freq: Every day | ORAL | 0 refills | Status: AC
Start: 2022-05-23 — End: ?

## 2022-05-23 MED ORDER — LURASIDONE HCL 40 MG PO TABS
40 MG | ORAL_TABLET | Freq: Every day | ORAL | 0 refills | Status: AC
Start: 2022-05-23 — End: ?

## 2022-05-23 MED ORDER — PRAVASTATIN SODIUM 40 MG PO TABS
40 MG | ORAL_TABLET | Freq: Every evening | ORAL | 0 refills | Status: AC
Start: 2022-05-23 — End: 2022-07-08

## 2022-05-23 MED ORDER — TRAZODONE HCL 150 MG PO TABS
150 MG | ORAL_TABLET | Freq: Every evening | ORAL | 0 refills | Status: AC
Start: 2022-05-23 — End: ?

## 2022-05-23 MED ORDER — FERROUS SULFATE 325 (65 FE) MG PO TABS
325 (65 Fe) MG | ORAL_TABLET | Freq: Every day | ORAL | 0 refills | Status: AC
Start: 2022-05-23 — End: 2022-07-08

## 2022-05-23 MED ORDER — PREGABALIN 200 MG PO CAPS
200 MG | ORAL_CAPSULE | Freq: Three times a day (TID) | ORAL | 0 refills | Status: DC
Start: 2022-05-23 — End: 2022-07-05

## 2022-05-23 MED FILL — PRAVASTATIN SODIUM 20 MG PO TABS: 20 MG | ORAL | Qty: 2

## 2022-05-23 MED FILL — METOPROLOL SUCCINATE ER 25 MG PO TB24: 25 MG | ORAL | Qty: 2

## 2022-05-23 MED FILL — PREGABALIN 50 MG PO CAPS: 50 MG | ORAL | Qty: 1

## 2022-05-23 MED FILL — ASPIRIN LOW DOSE 81 MG PO CHEW: 81 MG | ORAL | Qty: 1

## 2022-05-23 MED FILL — ALBUTEROL SULFATE HFA 108 (90 BASE) MCG/ACT IN AERS: 108 (90 Base) MCG/ACT | RESPIRATORY_TRACT | Qty: 1

## 2022-05-23 MED FILL — JARDIANCE 10 MG PO TABS: 10 MG | ORAL | Qty: 1

## 2022-05-23 MED FILL — ALBUTEROL SULFATE HFA 108 (90 BASE) MCG/ACT IN AERS: 108 (90 Base) MCG/ACT | RESPIRATORY_TRACT | Qty: 1.07

## 2022-05-23 MED FILL — HYDROXYZINE PAMOATE 25 MG PO CAPS: 25 MG | ORAL | Qty: 1

## 2022-05-23 MED FILL — LAMOTRIGINE 25 MG PO TABS: 25 MG | ORAL | Qty: 1

## 2022-05-23 MED FILL — FERROUS SULFATE 325 (65 FE) MG PO TABS: 325 (65 Fe) MG | ORAL | Qty: 1

## 2022-05-23 MED FILL — PREGABALIN 75 MG PO CAPS: 75 MG | ORAL | Qty: 2

## 2022-05-23 MED FILL — TRAZODONE HCL 50 MG PO TABS: 50 MG | ORAL | Qty: 2

## 2022-05-23 MED FILL — ACETAMINOPHEN 325 MG PO TABS: 325 MG | ORAL | Qty: 2

## 2022-05-23 MED FILL — PANTOPRAZOLE SODIUM 40 MG PO TBEC: 40 MG | ORAL | Qty: 1
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
## 2022-05-23 NOTE — Behavioral Health Treatment Team (Signed)
Pt  slept 7 hours 30 min.

## 2022-05-23 NOTE — Group Note (Signed)
Group Therapy Note    Date: 05/23/2022    Group Start Time: 1400  Group End Time: 3474  Group Topic: Psychoeducation        Yehuda Savannah        Group Therapy Note    Attendees: 6  Group Topic- To discuss recovery and coping strategies to use during challenging moments.      Patient was being discharged at the time of group.Discipline Responsible: Social Worker/Counselor      Signature:  Yehuda Savannah

## 2022-05-23 NOTE — Progress Notes (Signed)
SOCIAL WORK GROUP THERAPY PROGRESS NOTE    Group Time:  10:15am      Group Topic:  Coping Skills    C D Issues    Group Participation:      Pt moderately involved during group discussion & remained attentive. Some enthusiasm about referral to [employer] as "[employer]" to "get my life back on track". Pt denies anxiety & feels depression more under control.    Looked at the "Process of setting Goals", the value of a "daily" /  "weekly" schedule as tool to build self esteem, sharpen decision making skills and help define one's reality.    Pt was able to process the value of more structured time, ADL consistency and setting limits on others as priorities.     Personal Emergency Plan, including triggers & what to do & not to do, also covered. Pt admitted needs to ask for help sooner, when feels overwhelmed.

## 2022-05-23 NOTE — Group Note (Signed)
Group Therapy Note    Date: 05/23/2022    Group Start Time: 3710  Group End Time: 1500  Group Topic: Recreational        Jeanetta Alonzo        Group Therapy Note    Attendees: 2/2      Group Type: Self-Esteem Thumball     Group Focus: Patients used the self-esteem thumball to learn what healthy self-esteem is. Group members were able to answer a question, listen to peers and support each other. This group may help enhance self-awareness, reduce stress, and increase mood.         Notes:  Patient actively participated. Patient discussed goals upon discharge. Patient expressed wanting to start back cooking more, and feels like herself more when she cooks. Patient expressed being excited to be in a new environment. Patient expressed her love for nature and taking walks.     Status After Intervention:  Unchanged    Participation Level: Active Listener and Interactive    Participation Quality: Appropriate, Attentive, and Sharing      Speech:  normal      Thought Process/Content: Logical      Affective Functioning: Congruent      Mood: euthymic      Level of consciousness:  Alert and Attentive        Endings: None Reported      Modes of Intervention: Socialization      Recreational Therapist  Rylea Selway Marcello Moores

## 2022-05-23 NOTE — Plan of Care (Signed)
Problem: Safety - Adult  Goal: Free from fall injury  05/23/2022 0754 by Su Grand, RN  Outcome: Progressing  05/22/2022 2155 by Lincoln Brigham, RN  Outcome: Progressing     Problem: Nutrition Deficit:  Goal: Optimize nutritional status  Outcome: Progressing    PT demonstrates a brightened affect, neutral mood. Pt states she is excited about her potential discharge today. PT denies SI at this time. Out in day room participating in activities. Pt is compliant with her medications and cooperative with staff. 15 min safety checks maintained> Emotional support provided.

## 2022-05-23 NOTE — Other (Signed)
Pt.  is a 57 year old female Bipolar Disorder , Borderline Personality Disorder Cannabis Use Disorder  and Cocaine use Disorder. Patient was admitted for an overdose of pills.        Pt.'s case was discussed in staffing the patient will be dc today to a board an dc are [place], [place] .  Pt will be fallowed for mental health services with [facility]  [address] [place] [address]  [phone] fax [phone]. Pt will also follow up with her PCP. SW contacted pt' insurance to coordinate transportation. Confirmation # [id number].           Raynelle Chary MA, LMHP-R

## 2022-05-23 NOTE — Group Note (Signed)
Art Therapy Group Progress Note    PATIENT SCHEDULED FOR GROUP AT:  1:00 PM     GROUP STOP TIME:  1:45 PM     ATTENDANCE: High (9/12 participants)    TOPIC / FOCUS: [place]     GOALS:  examine protective factors, identify protective coping skills, increase resilience, encourage self-expression, promote creativity, increase feelings of strength    PARTICIPATION LEVEL:   interactive, participated in art making, contributed to group discussion     PARTICIPATION QUALITY:  appropriate, attentive, sharing     ATTENTION LEVEL: focused, moderate investment, low energy       LEVEL OF CONSCIOUSNESS: alert, oriented X 3    SPEECH: normal     THOUGHT PROCESS/ CONTENT: logical, linear     AFFECTIVE FUNCTIONING:  congruent     MOOD: euthymic      SYMBOLIC & THEMATIC CONTENT AS NOTED IN IMAGERY:  Pt wrote stability underneath their shield in bubble letters, each letter a different color. Pt identified medication, coffee, their new placement, therapy, and opportunity for a new start as their protective factors.     STATUS AFTER INTERVENTION: unchanged     RESPONSE TO LEARNING: able to explain artwork, able to verbalize current knowledge/ experience    ENDINGS: none reported    MODES OF INTERVENTION: exploration, art media, socialization, psychoeducation     DISCIPLINE RESPONSIBLE: Art Therapist     Titus Dubin  Art Therapist, MA

## 2022-05-23 NOTE — Progress Notes (Signed)
Warfarin dosing - Pharmacy consult note    Indication: mechanical MVR  Warfarin dose prior to admission: 7.5 mg daily except 10 mg q TuSa  Significant drug interactions: none    Recent Labs     05/21/22  0701 05/23/22  0621   INR 1.3* 2.9*     Date INR Dose (mg)   1/1 2.7 7.5   1/2 3.4 10   1/3 5.2 -   1/4 3.3 -   1/5 2.1 7.5   1/6 1.3 7.5   1/7 - 7.5   1/8 2.9 -     Assessment/Plan  Goal INR: 2.5 - 3.5  Hold warfarin today.    INR order daily. Pharmacy will continue to follow.     Rolley Sims, [facility]

## 2022-05-23 NOTE — Progress Notes (Signed)
Pt received discharge instructions, prescriptions, and emergency numbers. PT signed ROI. Pt completed satisfaction survey. All personal belongings and personal medications returned to pt. Staff escorted PT to main exit. Pt left via cab arranged by SW. Pt. Is encouraged to follow with   [facility]  [address] [place] [address]  [phone] fax [phone]    Pt will be dc to   [address]   [place] [address]

## 2022-05-23 NOTE — Group Note (Signed)
Group Therapy Note    Date: 05/23/2022    Group Start Time: 1500  Group End Time: 9381  Group Topic: Music Therapy      Jenny Reichmann        Group Therapy Note    Attendees: 6/12    Group Focus: Music therapy group explored what group members can do today to make the future better along with discussing preferences for focusing on past, present, and/or future. Group consisted of collaborative music making, singing, and verbal processing of music experiences. The group may promote self-expression, present-centered focus, and use of music as a coping skill.         Notes:  Patient did not attend group.      Discipline Responsible: Social Worker/Counselor      Signature:  Jenny Reichmann, MT-BC, [place] Music Engineer, civil (consulting)

## 2022-05-25 ENCOUNTER — Ambulatory Visit
Admit: 2022-05-25 | Discharge: 2022-05-25 | Payer: MEDICAID | Attending: Cardiovascular Disease | Primary: Family Medicine

## 2022-05-25 DIAGNOSIS — I5032 Chronic diastolic (congestive) heart failure: Secondary | ICD-10-CM

## 2022-05-25 MED ORDER — WARFARIN SODIUM 2.5 MG PO TABS
2.5 MG | ORAL_TABLET | Freq: Every day | ORAL | 2 refills | Status: AC
Start: 2022-05-25 — End: ?

## 2022-05-25 NOTE — Progress Notes (Signed)
HISTORY OF PRESENT ILLNESS  Mary [name] is a 57 y.o. female.    PMH History of Mitral valve replacement Bioprosthetic, CHF, Bipolar, Anxiety, Left atrial clip,   -----  CARDIAC STUDIES  ----  2d echo 05/06/2022    Left Ventricle: Low normal left ventricular systolic function with a visually estimated EF of 50 - 55%. Left ventricle is dilated. Normal wall thickness. Normal wall motion.    Aortic Valve: Mild annular calcification. Moderate regurgitation. Mild stenosis of the aortic valve. AV mean gradient is 9 mmHg. AV area by continuity VTI is 1.7 cm2.    Mitral Valve: Bioprosthetic valve. MV mean gradient is 7 mmHg. Moderate stenosis noted. Elevated prosthetic gradient.    Tricuspid Valve: Moderate regurgitation. The estimated RVSP is 52 mmHg.    Left Atrium: Left atrium is dilated.    Pulmonary Arteries: Moderate pulmonary hypertension present.    Image quality is fair. Limited study due to patient's ability to tolerate test.  ----  2d tte 08/06/2021    Left Ventricle: Normal left ventricular systolic function with a visually estimated EF of 55 - 60%. Left ventricle is mildly dilated. Increased wall thickness. See diagram for wall motion findings. Diastolic function present with increased LAP with normal LV EF.    Right Ventricle: Right ventricle is mildly dilated. RVIDd is 4.3 cm.    Aortic Valve: Moderate regurgitation with a centrally directed jet. Vena contracta measures 0.5 cm.    Mitral Valve: Bioprosthetic valve that is well-seated with normal leaflet motion. MV mean gradient is 7 mmHg. Moderate regurgitation.    Left Atrium: Left atrium is mildly dilated. Left atrial volume index is mildly increased (35-41 mL/m2). LA Vol Index A/L is 38 mL/m2.    Pulmonary Arteries: Pulmonary hypertension not present. The estimated PASP is 30 mmHg.  -----  LHC 04/2020  Mild single vessel coronary artery disease with preserved LV function.  Elevated LVEDP.  Recommend intense risk factor modification.  -----  Vascular duplex  upper extremity venous left   Acute non-occlusive deep vein thrombosis in 1 of 2 brachial veins within the left upper extremity.   Superficial acute non-occlusive thrombosis in the cephalic and basilic veins within the left upper extremity.  ----  Nuclear stress 08/2021  Low Risk Stress Test  No significant resting or reversible defects present  Normal ejection fraction 74% without significant TID 0.88  SSS = 0, SRS = 2, SDS = -2  ----  Cardiac telemetry 11/12/2021  Patient monitored for 14d 12h starting on 09/25/2021 09:59 am.     Primary rhythm was Sinus Rhythm. Average heart rate was 62 bpm, Minimum heart rate was 43 bpm on Day 11  / 08:06:09 am, Max heart rate was 139 bpm on Day 7 / 04:57:50 pm     Atrial Fibrillation or Flutter: Burden was 0 %, longest event 0 ms on --, fastest rate -- bpm on --.     SVE(s): Burden was 0.06 %, max count per 24 hours 56  SVT (AT, RT): 43 events, longest event 11 beats on Day 5 / 05:09:52 am, fastest event 169 bpm on Day 1 /  04:48:18 pm     Pause: 1 events, longest pause 2000 ms on Day 11 / 08:06:15 am  AV Block: 0 %, most severe block demonstrated --     PVC(s): Burden was < 0.01 %, max count per 24 hours 3, 2 disparate morphologies  Ventricular Tachycardia: 0 events, longest event 0 s at --, fastest rate --  bpm at  ----  [facility] 08/27/2015  1. Essentially normal coronary arteries with minor luminal   irregularities of mid RCA and otherwise normal coronaries.   2. Normal LV end diastolic pressure at 14 mmHg.  ----  LHC 04/27/2020  Mild single vessel coronary artery disease with preserved LV function.  Elevated LVEDP.  Recommend intense risk factor modification.   ----  Patient recorded 1 events during the monitoring period Occurred with sinus bradycardia and artifact 58bpm  ----    Have you had Fatigue?  No      2.   Have you had have you had Chest Pain? Reported some chest pain minutes, atypical, as before  3.   Have you had Dyspnea (SOB) ? No   4.   Have you had Orthopnea? No       5.   Have you had PND? No   6.   Have you had leg swelling? No   7.    Have you had any weight gain? No     8. Have you had any palpitations? No      9. Have you had any syncope? No   10. Do you have any wounds on legs? no      Reviewed with the patient and is as such.        Family History   Problem Relation Age of Onset    No Known Problems Mother        Past Medical History:   Diagnosis Date    Anxiety     Bipolar 1 disorder (HCC)     CHF (congestive heart failure) (HCC)     COPD (chronic obstructive pulmonary disease) (HCC)     DDD (degenerative disc disease), cervical     GERD (gastroesophageal reflux disease)     H/O aortic valve replacement     History of transient ischemic attack (TIA)     Mitral regurgitation and mitral stenosis     PTSD (post-traumatic stress disorder)     Scoliosis     Vitamin D deficiency        Past Surgical History:   Procedure Laterality Date    CARDIAC CATHETERIZATION      CARDIAC VALVE SURGERY      mitral valve replacement    CESAREAN SECTION      HAND/FINGER SURGERY UNLISTED Right     I&D right middle finger    HYSTERECTOMY (CERVIX STATUS UNKNOWN)      IR KYPHOPLASTY THORACIC FIRST LEVEL  03/03/2022    IR KYPHOPLASTY THORACIC FIRST LEVEL 03/03/2022 Samoilov, Dmitri E, MD [facility] RAD ANGIO IR    KNEE SURGERY      OVARY REMOVAL         Social History     Tobacco Use    Smoking status: Every Day     Current packs/day: 0.75     Types: Cigarettes     Passive exposure: Current    Smokeless tobacco: Never   Substance Use Topics    Alcohol use: Never       Allergies   Allergen Reactions    Lithium Nausea And Vomiting     Vomiting      Alprazolam Other (See Comments)     Gets suicidal    Dermatitis Antigen      Rash and blisers  Rash and blisers    Diazepam Other (See Comments)     Gets suicidal    Levofloxacin Other (See Comments)     Muscle  stiffness and bulging muscles     Tobacco Other (See Comments)     Increasing lung deterioration     Adhesive Tape Rash     blisters       Prior to  Admission medications    Medication Sig Start Date End Date Taking? Authorizing Provider   lamoTRIgine (LAMICTAL) 25 MG tablet Take 1 tablet by mouth in the morning, at noon, and at bedtime 05/08/22   Strong, Malachy Chamber, MD   empagliflozin (JARDIANCE) 10 MG tablet Take 1 tablet by mouth daily 05/09/22   Strong, Malachy Chamber, MD   nicotine (NICODERM CQ) 14 MG/24HR Place 1 patch onto the skin daily 05/08/22   Strong, Malachy Chamber, MD   omeprazole (PRILOSEC) 40 MG delayed release capsule Take 1 capsule by mouth daily 05/08/22   Natale Milch, MD   pravastatin (PRAVACHOL) 40 MG tablet Take 1 tablet by mouth daily 04/06/22   Gaston Islam, MD   warfarin (COUMADIN) 2.5 MG tablet Take 1 tablet by mouth daily 04/06/22   Gaston Islam, MD   TRELEGY ELLIPTA 100-62.5-25 MCG/ACT AEPB inhaler inhale 1 puff by mouth and INTO THE LUNGS once daily 03/17/22   [provider]   pregabalin (LYRICA) 200 MG capsule Take 1 capsule by mouth 3 times daily for 180 days. 03/24/22 09/20/22  Creola Corn, MD   baclofen (LIORESAL) 10 MG tablet Take 0.5-1 tablets by mouth 3 times daily as needed (pain/spasm) 03/24/22   Creola Corn, MD   Ubrogepant (UBRELVY) 100 MG TABS Take 100 mg by mouth as needed (Migraine) Take 100 mg at onset of headache; can repeat in 2 hours if no change; not to be taken more than 2 days a week. 12/10/21   Janeece Fitting D, MD   metoprolol succinate (TOPROL XL) 50 MG extended release tablet Take 1 tablet by mouth daily 12/03/21   Black, Wallene Huh, APRN - NP   Acetaminophen (TYLENOL ARTHRITIS PAIN PO) Take by mouth    [provider]   citalopram (CELEXA) 20 MG tablet Take 1 tablet by mouth daily 08/27/21   Reineke-Piper, Donnita [place], MD   vitamin B-12 (CYANOCOBALAMIN) 1000 MCG tablet Take 1 tablet by mouth daily    [provider]   albuterol sulfate HFA (PROVENTIL;VENTOLIN;PROAIR) 108 (90 Base) MCG/ACT inhaler Inhale 2 puffs into the lungs every 6 hours as needed 11/19/19   Automatic Reconciliation,  Ar   aspirin 81 MG chewable tablet Take 1 tablet by mouth daily    Automatic Reconciliation, Ar   cetirizine (ZYRTEC) 10 MG tablet Take 1 tablet by mouth daily 10/23/19   Automatic Reconciliation, Ar   docusate (COLACE, DULCOLAX) 100 MG CAPS Col-Rite 100 mg capsule   take 1 capsule by mouth twice a day if needed for constipation    Automatic Reconciliation, Ar   fluticasone (FLONASE) 50 MCG/ACT nasal spray 1 spray by nasal route daily 05/09/20   Automatic Reconciliation, Ar   furosemide (LASIX) 40 MG tablet Take 1 tablet by mouth daily as needed    Automatic Reconciliation, Ar       No results found for: "LIPIDPAN", "BMP", "CMP"     There were no vitals taken for this visit.    HPI    Review of Systems   Constitutional:  Negative for activity change, appetite change, diaphoresis, fatigue and unexpected weight change.   Eyes:  Negative for visual disturbance.   Respiratory:  Negative for apnea, cough, chest tightness, shortness of breath  and wheezing.    Cardiovascular:  Negative for chest pain, palpitations and leg swelling.   Gastrointestinal:  Negative for abdominal pain, blood in stool, constipation, diarrhea and nausea.   Endocrine: Negative for cold intolerance and heat intolerance.   Genitourinary:  Negative for decreased urine volume and difficulty urinating.   Musculoskeletal:  Negative for gait problem, myalgias and neck pain.   Skin:  Negative for color change, pallor and rash.   Neurological:  Negative for dizziness, syncope, facial asymmetry, speech difficulty, weakness, light-headedness and numbness.   Psychiatric/Behavioral:  Negative for confusion and sleep disturbance.          Physical Exam  Vitals reviewed.   Constitutional:       General: She is not in acute distress.     Appearance: Normal appearance. She is not ill-appearing or diaphoretic.   HENT:      Head: Normocephalic and atraumatic.   Eyes:      General: No scleral icterus.        Right eye: No discharge.         Left eye: No discharge.       Conjunctiva/sclera: Conjunctivae normal.   Neck:      Vascular: No carotid bruit.   Cardiovascular:      Rate and Rhythm: Normal rate and regular rhythm.      Heart sounds: Normal heart sounds. No murmur heard.     No friction rub. No gallop.   Pulmonary:      Effort: Pulmonary effort is normal. No respiratory distress.      Breath sounds: Normal breath sounds. No wheezing, rhonchi or rales.   Chest:      Chest wall: No tenderness.   Abdominal:      General: Bowel sounds are normal. There is no distension.      Palpations: Abdomen is soft.      Tenderness: There is no abdominal tenderness. There is no guarding.   Musculoskeletal:         General: No swelling or tenderness.      Cervical back: Neck supple.      Right lower leg: No edema.      Left lower leg: No edema.   Skin:     General: Skin is warm and dry.      Findings: No erythema or rash.   Neurological:      Mental Status: She is alert. Mental status is at baseline.      Motor: No weakness.      Gait: Gait normal.   Psychiatric:         Mood and Affect: Mood normal.         Behavior: Behavior normal.         Thought Content: Thought content normal.         ASSESSMENT and PLAN  Ms. Lamia has a reminder for a "due or due soon" health maintenance. I have asked that she contact her primary care provider for follow-up on this health maintenance.     Chronic heart failure preserved ejection fraction - Monitor fluid status, adjust as necessary, fluid restrication 2L/day, salt intake <2g per day. Currently on jardiance 10mg  po daily as needed weight gain 2 to 3 pounds     Chest pain -   Low risk stress test 08/2021 but with continued smoking, sent Nitro PRN. Patient advised that if increasing frequency duration and intensity of discomfort or unrelieved discomfort to call EMS to visit emergency department.  Check  Cardiac CTA evaluate for obstructive CAD with disease accelerating factors.  If increasing frequency duration intensity to visit emergency department.        Moderate aortic regurgitation - Survey periodically 1-2 years.  2d echo 04/2022.      Mitral valve bioprosthetic with mean gradient 7 mmHg 08/02/2021 - Survey periodically 1-2 years.  2d echo 04/2022.      HLD - Continue pravastatin 40mg  po daily, survey lipid panel periodically.  Lipid panel 04/19/2022, LDL 155mg /dL, HDL 97 mg/dL    Atrial fibrillation - On coumadin, requests refill, INR 2-3 Check for primary prevention of stroke.  Refill.  Metoprolol succinate 50mg  po daily.       HTN - Stage I pressure, metoprolol succinate 50mg  po daily,

## 2022-05-25 NOTE — Progress Notes (Signed)
Have you had Fatigue?  No     2.   Have you had have you had Chest Pain? No   3.   Have you had Dyspnea (SOB) ? No   4.   Have you had Orthopnea? No     5.   Have you had PND? No   6.   Have you had leg swelling? No   7.    Have you had any weight gain? No    8. Have you had any palpitations? No      9. Have you had any syncope? No   10. Do you have any wounds on legs? no

## 2022-05-25 NOTE — Patient Instructions (Signed)
Learning About the [place] Diet  What is the Mediterranean diet?     The Mediterranean diet is a style of eating rather than a diet plan. It features foods eaten in [place], [place], southern [place] and [place], and other countries along the [employer]. It emphasizes eating foods like fish, fruits, vegetables, beans, high-fiber breads and whole grains, nuts, and olive oil. This style of eating includes limited red meat, cheese, and sweets.  Why choose the Mediterranean diet?  A Mediterranean-style diet may improve heart health. It contains more fat than other heart-healthy diets. But the fats are mainly from nuts, unsaturated oils (such as fish oils and olive oil), and certain nut or seed oils (such as canola, soybean, or flaxseed oil). These fats may help protect the heart and blood vessels.  How can you get started on the Mediterranean diet?  Here are some things you can do to switch to a more Mediterranean way of eating.  What to eat  Eat a variety of fruits and vegetables each day, such as grapes, blueberries, tomatoes, broccoli, peppers, figs, olives, spinach, eggplant, beans, lentils, and chickpeas.  Eat a variety of whole-grain foods each day, such as oats, brown rice, and whole wheat bread, pasta, and couscous.  Eat fish at least 2 times a week. Try tuna, salmon, mackerel, lake trout, herring, or sardines.  Eat moderate amounts of low-fat dairy products, such as milk, cheese, or yogurt.  Eat moderate amounts of poultry and eggs.  Choose healthy (unsaturated) fats, such as nuts, olive oil, and certain nut or seed oils like canola, soybean, and flaxseed.  Limit unhealthy (saturated) fats, such as butter, palm oil, and coconut oil. And limit fats found in animal products, such as meat and dairy products made with whole milk. Try to eat red meat only a few times a month in very small amounts.  Limit sweets and desserts to only a few times a week. This includes sugar-sweetened drinks like soda.  The  Mediterranean diet may also include red wine with your meal--1 glass each day for women and up to 2 glasses a day for men.  Tips for eating at home  Use herbs, spices, garlic, lemon zest, and citrus juice instead of salt to add flavor to foods.  Add avocado slices to your sandwich instead of bacon.  Have fish for lunch or dinner instead of red meat. Brush the fish with olive oil, and broil or grill it.  Sprinkle your salad with seeds or nuts instead of cheese.  Cook with olive or canola oil instead of butter or oils that are high in saturated fat.  Switch from 2% milk or whole milk to 1% or fat-free milk.  Dip raw vegetables in a vinaigrette dressing or hummus instead of dips made from mayonnaise or sour cream.  Have a piece of fruit for dessert instead of a piece of cake. Try baked apples, or have some dried fruit.  Tips for eating out  Try broiled, grilled, baked, or poached fish instead of having it fried or breaded.  Ask your server to have your meals prepared with olive oil instead of butter.  Order dishes made with marinara sauce or sauces made from olive oil. Avoid sauces made from cream or mayonnaise.  Choose whole-grain breads, whole wheat pasta and pizza crust, brown rice, beans, and lentils.  Cut back on butter or margarine on bread. Instead, you can dip your bread in a small amount of olive oil.  Ask for a side  salad or grilled vegetables instead of french fries or chips.  Where can you learn more?  Go to https://www.healthwise.net/patientEd and enter O407 to learn more about "Learning About the Mediterranean Diet."  Current as of: Sep 21, 2020               Content Version: 13.5   2006-2022 [employer].   Care instructions adapted under license by [facility]. If you have questions about a medical condition or this instruction, always ask your healthcare professional. Healthwise, Incorporated disclaims any warranty or liability for your use of this information.

## 2022-05-27 LAB — PROTIME-INR: INR: 6.4

## 2022-05-27 NOTE — Patient Instructions (Signed)
Ms. Jannah Guardiola is here today for anticoagulation monitoring for the diagnosis of atrial fibrillation.  Her INR goal is 2.5-3.5 and her current Coumadin dose is.     Today's findings include an INR of 6.4     Considering Ms. Kenan past history, todays findings, and per the coumadin policy/protocol, Ms. Canion was instructed to take Coumadin as follows,  HOLD 05/27/22 - 1/134/24 otherwise; 7.5mg  A.O.D.  She was also instructed to come back in 1 weeks for an INR check.    A full discussion of the nature of anticoagulants has been carried out.  A full discussion of the need for frequent and regular monitoring, precise dosage adjustment and compliance was stressed.  Side effects of potential bleeding were discussed and Ms. Warman was instructed to call [phone] if there are any signs of abnormal bleeding.  Ms. Tersigni was instructed to avoid any OTC items containing aspirin or ibuprofen and prior to starting any new OTC products to consult with her physician or pharmacist to ensure no drug interactions are present.  Ms. Pinkstaff was instructed to avoid any major changes in her general diet and to avoid alcohol consumption.  .      Ms. Linehan verbalized her understanding of all instructions and will call the office with any questions, concerns, or signs of abnormal bleeding or blood clot.

## 2022-05-31 NOTE — Telephone Encounter (Signed)
Formatting of this note might be different from the original.  [place] FOR HER TO CALL TO [place] [facility]   Electronically signed by Jannet Askew P at 05/31/2022  8:18 AM EST

## 2022-06-01 ENCOUNTER — Telehealth

## 2022-06-01 NOTE — Telephone Encounter (Signed)
Patient called again checking the status of previous message

## 2022-06-01 NOTE — Telephone Encounter (Signed)
Patient called for Dr.Arora.    Patient said that their is something going on with her back. That she thinks she may have a Fx.    Patient said she can not sit, or stand. That her back has been hurting for the last five days.    Patient is asking that Dr.Arora order her an Xray or an Mri. That she can have it done at [facility]. That she needs some test done to see what is going on with her back.    Patient also said that if Dr. Rory Percy orders her an Mri, she will need One Pill of medication to help her through the Mri , due to she is Claustrophobic.     [facility] on [address] in [place] ( patient said this pharmacy is still open )  Tel. [phone]    Patient tel. [phone].

## 2022-06-01 NOTE — Telephone Encounter (Signed)
Is the pain in the thoracic or lumbar (bra line or low back)?

## 2022-06-02 LAB — PROTIME-INR: INR: 3.4

## 2022-06-02 NOTE — Telephone Encounter (Signed)
I called and notified the pt that the order for the thoracic x-ray was put in. The pt was identified using 2 pt identifiers. She is aware that she can go to either [place] [facility] or [facility] to get this done.

## 2022-06-02 NOTE — Telephone Encounter (Signed)
I put in a thoracic xr order that can be done at [facility] or [facility].

## 2022-06-02 NOTE — Telephone Encounter (Signed)
I called and spoke the pt. The pt was identified using 2 pt identifiers. She reports that the pain is just slightly below the bra line. The message will be sent back to the provider for review.

## 2022-06-02 NOTE — Patient Instructions (Signed)
[name] is here today for anticoagulation monitoring for the diagnosis of mechanical mitral valve.  Her INR goal is 2.5-3.5 and her current Coumadin dose is 7.5mg .     Today's findings include an INR of 3.4     Considering Mary [name] past history, todays findings, and per the coumadin policy/protocol, Mary [name] was instructed to take Coumadin as follows,  HOLD 06/03/22-06/04/22 OTHERWISE; 7.5MG  NIGHTLY.  She was also instructed to come back in 1 weeks for an INR check.    A full discussion of the nature of anticoagulants has been carried out.  A full discussion of the need for frequent and regular monitoring, precise dosage adjustment and compliance was stressed.  Side effects of potential bleeding were discussed and Mary [name] was instructed to call [phone] if there are any signs of abnormal bleeding.  Mary [name] was instructed to avoid any OTC items containing aspirin or ibuprofen and prior to starting any new OTC products to consult with her physician or pharmacist to ensure no drug interactions are present.  Mary [name] was instructed to avoid any major changes in her general diet and to avoid alcohol consumption.  .      Mary [name] verbalized her understanding of all instructions and will call the office with any questions, concerns, or signs of abnormal bleeding or blood clot.

## 2022-06-03 ENCOUNTER — Inpatient Hospital Stay: Admit: 2022-06-03 | Payer: MEDICAID | Primary: Family Medicine

## 2022-06-03 ENCOUNTER — Encounter

## 2022-06-03 DIAGNOSIS — M546 Pain in thoracic spine: Secondary | ICD-10-CM

## 2022-06-03 NOTE — Telephone Encounter (Signed)
Patient called back verified by two identifiers explained Unforturnately, we are unable to work her in today due to a full schedule. Have her go to [facility] for the toradol injection. She verbalized understanding and thanked for the call.

## 2022-06-03 NOTE — Other (Signed)
New T9 deformity. T MRI ordered.

## 2022-06-03 NOTE — Telephone Encounter (Signed)
Patient is currently at [facility] for testing ordered by Dr. Rory Percy and is asking if she can come in to see NP Blount to get a Toradol injection in her buttocks area.    She then stated that she's in pain , she's a recovering addict and is requesting the non-narcotic injection today to give her some relief.    She wants to stop by after she leaves [facility] today , maybe in an hour or so.    Please advise.    Patient can be reached at [phone].

## 2022-06-03 NOTE — Telephone Encounter (Signed)
Called patient received voicemail left message to call the office back. # provided.

## 2022-06-03 NOTE — Telephone Encounter (Signed)
Unforturnately, we are unable to work her in today due to a full schedule.  Have her go to [facility] for the toradol injection.

## 2022-06-06 ENCOUNTER — Telehealth

## 2022-06-06 MED ORDER — TIZANIDINE HCL 4 MG PO TABS
4 MG | ORAL_TABLET | ORAL | 0 refills | Status: DC
Start: 2022-06-06 — End: 2022-07-08

## 2022-06-06 MED ORDER — OXYCODONE-ACETAMINOPHEN 5-325 MG PO TABS
5-325 MG | ORAL_TABLET | Freq: Four times a day (QID) | ORAL | 0 refills | Status: DC | PRN
Start: 2022-06-06 — End: 2022-06-06

## 2022-06-06 MED ORDER — TIZANIDINE HCL 4 MG PO TABS
4 MG | ORAL_TABLET | ORAL | 0 refills | Status: DC
Start: 2022-06-06 — End: 2022-06-06

## 2022-06-06 MED ORDER — OXYCODONE-ACETAMINOPHEN 5-325 MG PO TABS
5-325 MG | ORAL_TABLET | Freq: Four times a day (QID) | ORAL | 0 refills | Status: AC | PRN
Start: 2022-06-06 — End: 2022-06-13

## 2022-06-06 NOTE — Telephone Encounter (Signed)
Patient is requesting medication for pain and wants to have a MRI, patient states that she wants to discuss her xray results. Patient said she only completed xray not an MRI.    Patient also wants something to strengthen bones       [place] [address]      Patient tel: [phone]

## 2022-06-06 NOTE — Telephone Encounter (Signed)
I called and spoke to the pt. The pt was identified using 2 pt identifiers. She states she is scheduled for the MRI on 06/16/22. Ms. Lippold is asking for medication to help with her claustrophobia for the MRI.  She is also wanting to know if she can get something for her pain. The pt reports that she is unable to do anything with out extreme pain. She is not able to stand up for any length of time. It is effecting her ability to cook, clean, and do simple daily tasks. The pt was notified that her message will be routed to Dr. Rory Percy for review. Ms. Geiger states that she is now at a facility. If anything medication wise is ordered for her it will be given to her by the staff. She also wants to know if there is any medications she should be taking over the counter or prescribed to help strengthen her bones.

## 2022-06-06 NOTE — Telephone Encounter (Signed)
New comp fx on xray. Hx multiple kypho. MRI pending. Will likely need IR again.    PMP reviewed. No opioids or benzos.     Gets suicidal w/valium . Will zanaflex for MRI and acute pain rx Percocet.

## 2022-06-06 NOTE — Telephone Encounter (Signed)
I called and spoke to the pt. The pt was identified using 2 pt identifiers. She was notified of Dr. Johny Chess response to her recent message. Both medications were sent to the wrong pharmacy. I have re-pended the medication with the correct one. No questions or concerns voiced from the pt at this time.

## 2022-06-09 ENCOUNTER — Encounter

## 2022-06-09 ENCOUNTER — Ambulatory Visit: Admit: 2022-06-09 | Discharge: 2022-06-09 | Payer: MEDICAID | Attending: Orthopaedic Surgery | Primary: Family Medicine

## 2022-06-09 DIAGNOSIS — M65311 Trigger thumb, right thumb: Secondary | ICD-10-CM

## 2022-06-09 NOTE — Progress Notes (Signed)
Mary [name] is a 57 y.o. female right handed.  Worker's Camera operator considerations: none    Chief Complaint   Patient presents with    Hand Pain     Right thumb trigger     Pain Score:   5    HPI: Patient presents today due to recurrence of right thumb pain and locking.    11/08/2021 HPI: Patient presents today due to recurrence of right thumb pain and locking.  She reports her previous trigger thumb injection relieved most of her symptoms.     Initial HPI: Patient presents today for follow-up of right middle finger I&D of the dorsum of the finger as well as the flexor tendon sheath.  She reports to be doing well and has completed her antibiotics.  She denies any recurrence of pain.  She reports some stiffness in the finger and she is doing some exercises at home.  She reports no one is called her about therapy yet.    Date of onset: January 2023  Injury: No  Prior Treatment:  Yes: Comment: Right trigger thumb injection.  Debridement of right middle finger.    ROS: Review of Systems - General ROS: negative except HPI    Past Medical History:   Diagnosis Date    Anxiety     Bipolar 1 disorder (HCC)     CHF (congestive heart failure) (HCC)     COPD (chronic obstructive pulmonary disease) (HCC)     DDD (degenerative disc disease), cervical     GERD (gastroesophageal reflux disease)     H/O aortic valve replacement     History of transient ischemic attack (TIA)     Mitral regurgitation and mitral stenosis     PTSD (post-traumatic stress disorder)     Scoliosis     Vitamin D deficiency        Past Surgical History:   Procedure Laterality Date    CARDIAC CATHETERIZATION      CARDIAC VALVE SURGERY      mitral valve replacement    CESAREAN SECTION      HAND/FINGER SURGERY UNLISTED Right     I&D right middle finger    HYSTERECTOMY (CERVIX STATUS UNKNOWN)      IR KYPHOPLASTY THORACIC FIRST LEVEL  03/03/2022    IR KYPHOPLASTY THORACIC FIRST LEVEL 03/03/2022 Samoilov, Dmitri E, MD [facility] RAD ANGIO IR    KNEE SURGERY       OVARY REMOVAL          Current Outpatient Medications   Medication Sig Dispense Refill    oxyCODONE-acetaminophen (PERCOCET) 5-325 MG per tablet Take 1 tablet by mouth every 6 hours as needed for Pain for up to 7 days. Intended supply: 7 days. Take lowest dose possible to manage pain Max Daily Amount: 4 tablets 28 tablet 0    tiZANidine (ZANAFLEX) 4 MG tablet One-two po 30-60 minutes prior to MRI. 2 tablet 0    baclofen (LIORESAL) 10 MG tablet Take 1 tablet by mouth as needed      warfarin (COUMADIN) 2.5 MG tablet Take 3 tablets by mouth daily 270 tablet 2    albuterol sulfate HFA (PROVENTIL;VENTOLIN;PROAIR) 108 (90 Base) MCG/ACT inhaler Inhale 2 puffs into the lungs every 6 hours as needed for Wheezing 18 g 0    lamoTRIgine (LAMICTAL) 25 MG tablet Take 1 tablet by mouth daily 30 tablet 0    pregabalin (LYRICA) 200 MG capsule Take 1 capsule by mouth 3 times daily for 30 days. Max  Daily Amount: 600 mg 90 capsule 0    traZODone (DESYREL) 150 MG tablet Take 0.5 tablets by mouth nightly 15 tablet 0    empagliflozin (JARDIANCE) 10 MG tablet Take 1 tablet by mouth daily 30 tablet 0    pravastatin (PRAVACHOL) 40 MG tablet Take 1 tablet by mouth nightly 30 tablet 0    lurasidone (LATUDA) 40 MG TABS tablet Take 1 tablet by mouth Daily with supper 30 tablet 0    metoprolol succinate (TOPROL XL) 50 MG extended release tablet Take 1 tablet by mouth daily 30 tablet 0    fluticasone (FLONASE) 50 MCG/ACT nasal spray 1 spray by Each Nostril route daily 16 g 0    lidocaine 4 % external patch Place 1 patch onto the skin daily as needed (Mild Pain) 15 patch 0    ferrous sulfate (IRON 325) 325 (65 Fe) MG tablet Take 1 tablet by mouth daily (with breakfast) 30 tablet 0    docusate (COLACE, DULCOLAX) 100 MG CAPS Take 100 mg by mouth daily 30 capsule 0    omeprazole (PRILOSEC) 40 MG delayed release capsule Take 1 capsule by mouth daily 30 capsule 1    TRELEGY ELLIPTA 100-62.5-25 MCG/ACT AEPB inhaler inhale 1 puff by mouth and INTO THE  LUNGS once daily      Ubrogepant (UBRELVY) 100 MG TABS Take 100 mg by mouth as needed (Migraine) Take 100 mg at onset of headache; can repeat in 2 hours if no change; not to be taken more than 2 days a week. 10 tablet 3    vitamin B-12 (CYANOCOBALAMIN) 1000 MCG tablet Take 1 tablet by mouth daily      aspirin 81 MG chewable tablet Take 1 tablet by mouth daily      furosemide (LASIX) 40 MG tablet Take 1 tablet by mouth daily as needed       No current facility-administered medications for this visit.       Allergies   Allergen Reactions    Lithium Nausea And Vomiting     Vomiting      Alprazolam Other (See Comments)     Gets suicidal    Dermatitis Antigen      Rash and blisers  Rash and blisers    Diazepam Other (See Comments)     Gets suicidal    Levofloxacin Other (See Comments)     Muscle stiffness and bulging muscles     Tobacco Other (See Comments)     Increasing lung deterioration     Adhesive Tape Rash     blisters         There were no vitals taken for this visit.  Physical Exam  Vitals and nursing note reviewed.   Constitutional:       General: She is not in acute distress.     Appearance: Normal appearance. She is not ill-appearing.   Cardiovascular:      Pulses: Normal pulses.   Pulmonary:      Effort: Pulmonary effort is normal. No respiratory distress.   Musculoskeletal:         General: Swelling and tenderness present. No deformity or signs of injury. Normal range of motion.      Cervical back: Normal range of motion and neck supple.      Right lower leg: No edema.      Left lower leg: No edema.   Skin:     General: Skin is warm and dry.      Capillary Refill: Capillary  refill takes less than 2 seconds.      Findings: No bruising or erythema.   Neurological:      General: No focal deficit present.      Mental Status: She is alert and oriented to person, place, and time.   Psychiatric:         Mood and Affect: Mood normal.         Behavior: Behavior normal.          Hand:    Examination L Digit(s) R  Digit(s)   1st CMC Tenderness -  -    1st CMC Grind -  -    Bouchard Nodes -  -    Heberden Nodes -  -    A1 Pulley Tenderness -  + Th   Triggering -  +    UCL Instability -  -    RCL Instability -  -    Lateral Stress Pain -  -    Palmar Cords -  -    Tabletop test -  -    Garrod's Pads -  -    Grip Strength       Pinch Strength         ROM: Full        Imaging:     None indicated      Impression     Diagnosis Orders   1. Trigger thumb, right thumb  SCHEDULE SURGERY            Plan:     Schedule right trigger thumb release.    Discussed operative versus nonoperative treatment, postoperative convalescence, and answered all questions.    Return in 26 days (on 07/05/2022) for H&P.     Plan was reviewed with patient, who verbalized agreement and understanding of the plan    Note: This note was completed using voice recognition software.  Any typographical/name errors or mistakes are unintentional.

## 2022-06-10 LAB — PROTIME-INR: INR: 6.5

## 2022-06-10 NOTE — Patient Instructions (Signed)
LEFT VOICEMAIL FOR PT. REGARDING WARFARIN INSTRUCTION BELOW.    Ms. Ailen Strauch is here today for anticoagulation monitoring for the diagnosis of atrial fibrillation.  Her INR goal is 2.0-2.5 and her current Coumadin dose is 7.5mg .     Today's findings include an INR of 6.5     Considering Ms. Gayton past history, todays findings, and per the coumadin policy/protocol, Ms. Taffe was instructed to take Coumadin as follows,  HOLD 06/10/22 - .06/13/22  She was also instructed to come back in 3 DAYS for an INR check.    A full discussion of the nature of anticoagulants has been carried out.  A full discussion of the need for frequent and regular monitoring, precise dosage adjustment and compliance was stressed.  Side effects of potential bleeding were discussed and Ms. Guallpa was instructed to call [phone] if there are any signs of abnormal bleeding.  Ms. Nodal was instructed to avoid any OTC items containing aspirin or ibuprofen and prior to starting any new OTC products to consult with her physician or pharmacist to ensure no drug interactions are present.  Ms. Bolinger was instructed to avoid any major changes in her general diet and to avoid alcohol consumption.  .      Ms. Piccininni verbalized her understanding of all instructions and will call the office with any questions, concerns, or signs of abnormal bleeding or blood clot.

## 2022-06-13 ENCOUNTER — Encounter

## 2022-06-13 ENCOUNTER — Telehealth

## 2022-06-13 LAB — PROTIME-INR: INR: 2.1

## 2022-06-13 MED ORDER — OXYCODONE-ACETAMINOPHEN 5-325 MG PO TABS
5-325 MG | ORAL_TABLET | Freq: Three times a day (TID) | ORAL | 0 refills | Status: DC | PRN
Start: 2022-06-13 — End: 2022-06-14

## 2022-06-13 NOTE — Telephone Encounter (Signed)
Patient called in stating that her Left side sciatic is messing up and would like to know if she get an order for an MRI on that side since she is going for her MRI on Thursday and would like to know if she can get a refill on her pain meds and a muscle relaxer called in to her pharmacy.        [phone]

## 2022-06-13 NOTE — Telephone Encounter (Signed)
I called and spoke to [name]. [name] was identified using 2 Mary [name] identifiers. She was notified that the order for the lumbar MRI has been ordered but may not be covered by insurance. I informed [name] that the thoracic MRI is the more pressing concern at the moment, per Dr. Rory Percy. I did ask [name] what muscle relaxer she has been taking. She states she has been taking the baclofen. The percocet prescription was sent to the [facility]. [name] is now using the [facility]. I let [name] know that the prescription will need to be reordered and sent to the other pharmacy. I did explain to [name] that this may happen tomorrow because the provider had to leave the office unexpectedly. She verbalized understanding and has no questions or concerns at this time.

## 2022-06-13 NOTE — Patient Instructions (Signed)
[name] is here today for anticoagulation monitoring for the diagnosis of atrial fibrillation.  Her INR goal is 2.5-3.5 and her current Coumadin dose is held for held for 3 days.     Today's findings include an INR of 2.1     Considering Mary [name] past history, todays findings, and per the coumadin policy/protocol, Mary [name] was instructed to take Coumadin as follows,  5 mg on Monday/Wednesday;2.5 mg all other days.  She was also instructed to come back in 5 days for an INR check.    A full discussion of the nature of anticoagulants has been carried out.  A full discussion of the need for frequent and regular monitoring, precise dosage adjustment and compliance was stressed.  Side effects of potential bleeding were discussed and Mary [name] was instructed to call [phone] if there are any signs of abnormal bleeding.  Mary [name] was instructed to avoid any OTC items containing aspirin or ibuprofen and prior to starting any new OTC products to consult with her physician or pharmacist to ensure no drug interactions are present.  Mary [name] was instructed to avoid any major changes in her general diet and to avoid alcohol consumption.  .      Mary [name] verbalized her understanding of all instructions and will call the office with any questions, concerns, or signs of abnormal bleeding or blood clot.

## 2022-06-13 NOTE — Telephone Encounter (Signed)
Patient last evaluated in office 03/2022. Has new thoracic comp fx by xray. For T MRI this week.  Will put in for L MRI, may or may not be approved. T spine MRI more urgent.    What muscle relaxer does she want? Zanaflex and baclofen on her list.  Percocet acute pain rx sent.

## 2022-06-14 ENCOUNTER — Ambulatory Visit: Payer: MEDICAID | Primary: Family Medicine

## 2022-06-14 MED ORDER — BACLOFEN 5 MG PO TABS
5 MG | ORAL_TABLET | ORAL | 0 refills | Status: AC
Start: 2022-06-14 — End: 2022-07-08

## 2022-06-14 MED ORDER — OXYCODONE-ACETAMINOPHEN 5-325 MG PO TABS
5-325 MG | ORAL_TABLET | Freq: Three times a day (TID) | ORAL | 0 refills | Status: AC | PRN
Start: 2022-06-14 — End: 2022-06-21

## 2022-06-14 NOTE — Telephone Encounter (Signed)
Oxy and baclofen sent to pharmacy.

## 2022-06-14 NOTE — Telephone Encounter (Signed)
Attempted to contact the pt regarding her message. She was not able to be reached. A message was left for the pt letting her know that prescriptions for baclofen and percocet were sent over to the [facility] as requested. The number to our office was provided in case she has any questions or concerns.

## 2022-06-14 NOTE — Telephone Encounter (Signed)
Patient called again crying . Said she took the last Oxycodone medication today and needs the medication refill today, that she has someone that drives her to get the medications, if we do not take care of this right away, she will not be able to pick up the medication today.    Patient is requesting a call back as soon as possible today.    Patient tel. [phone].    Note : please see previous messages.

## 2022-06-14 NOTE — Telephone Encounter (Signed)
Patient states she is not trying to bother [place] but wants to know when her medication be call in to the correct pharmacy because she will be out of her medication today.     Patient is requesting her muscle relaxer and oxyCODONE-acetaminophen (PERCOCET)      Preferred pharmacy: [facility], [place] [address] - P [phone] - F [phone]     Patient tel: [phone]

## 2022-06-14 NOTE — Telephone Encounter (Signed)
Please see previous messages.    Patient called again in regards to the medication being sent to the wrong pharmacy.    Patient is asking that all her medications not be sent to [facility] any longer. That she has moved and no longer uses [facility].    Patient is asking that the Oxycodone that was sent to [facility] , be switched  and sent to :  [facility] on [address] In [place]  Tel [phone]    Patient tel. [phone].

## 2022-06-14 NOTE — Telephone Encounter (Signed)
A new prescription has been pended for percocet with the correct pharmacy attached. The pt also reports that she has been taking the baclofen for the muscle relaxer. Please advise.

## 2022-06-15 ENCOUNTER — Ambulatory Visit: Payer: MEDICAID | Primary: Family Medicine

## 2022-06-16 ENCOUNTER — Ambulatory Visit: Payer: MEDICAID | Primary: Family Medicine

## 2022-06-16 DIAGNOSIS — M546 Pain in thoracic spine: Secondary | ICD-10-CM

## 2022-06-16 NOTE — Other (Signed)
Call pt. Ordered IR eval for additional kyphoplasty.

## 2022-06-19 NOTE — Progress Notes (Signed)
New, symptomatic T9 fx. Refer to IR for additional kyphoplasty.

## 2022-06-19 NOTE — Addendum Note (Signed)
Addended by: Creola Corn on: 06/19/2022 06:19 PM     Modules accepted: Orders

## 2022-06-20 NOTE — Progress Notes (Signed)
Formatting of this note is different from the original.  [place]                                 Trilla:    [id number]                                              Current Date/Time: June 20, 2022 2:02 PM MRN:  [id number] Emergency Dept   Name: DAJANIQUE, ROBLEY Sex: F Acct #: [id number] SSN: [id number]   DOB: April 29, 1966 (56 yrs) Adm Src: NON-HEALTH CARE LOCATION* Adm Type: EMERGENCY F/C: MEDICARE REP   Adm D/T 06/20/2022 1112    D/C Date:   Unit: [facility] ED Bed: 41H   DX: No admission diagnoses are documented for this encounter. Race: PATIENT REFU   Submitter:   Attn MD: Reichmeider, Benard Halsted, MD   Ordering:   M/S DIVORCED Religion: [employer] Adv Care:     Serv: Emergency Medicine Com Need: NO   PCP: Vincenza Hews, * Baby Bracelet#:        Procedure: No admission procedures for hospital encounter. Fin/Assitance: NO   Surg D/T    * No surgery found *       Complaint: RESCUE // SI Language: ENGLISH   Patient      Address: [place]: [place]: [place] Zip: [address] Tel: [phone]   Employer:   Address:     City:   State:   Zip:   Tel:     Additional Contacts  Contact 1:   Rel:    H Ph:   C Ph:     Contact 2:   Rel:   H Ph:   C Ph:     Guarantor  Name: GAILENE, YOUKHANA Rel: Self DOB: Aug 03, 1965 SSN: [id number]   Address: [address]: [place]: [place] Zip: [address] Tel: [phone]   Employer:   Address:     City:   State:   Zip:   Tel:     Engineer, agricultural / Plan: [employer] DSNP Subscriber: Lavenia Atlas SSN: [id number]   Fraser Din Rel: Self  Authorization #:    Subscriber ID: [id number]     Group#:     Group Name:   Ins Name: [employer]   Auth/Cert Auth number: N/A                   Begin: 09/13/2021 End:     Secondary Insurance    Desc / Plan:   Subscriber:       Subscriber SSN:     Pat Rel:       Subscriber ID:       Group#:     Group Name:   Ins Name:                   Begin:   End:     Heritage manager / Plan:   Subscriber:       Subscriber SSN:     Pat Rel:       Subscriber ID:       Group#:     Group Name:  Ins Name:                   Begin:   End       Latest Reference Range & Units 06/20/22 11:07 06/20/22 11:16 06/20/22 11:30 06/20/22 11:46 06/20/22 11:54   WBC 4.0 - 11.0 K/uL     4.8   RBC 3.80 - 5.20 M/uL     4.20   HGB 11.7 - 16.0 g/dL     12.6   HCT 35.1 - 48.0 %     37.9   MCV 80 - 99 fL     90   MCH 26 - 34 pg     30   MCHC 31 - 36 g/dL     33   RDW 10.0 - 15.5 %     12.9   Platelet 140 - 440 K/uL     286   MPV 9.0 - 13.0 fL     9.9   Segmented Neutrophils (Auto) 40 - 75 %     60   Lymphocytes (Auto) 20 - 45 %     34   Monocytes (Auto) 3 - 12 %     5   Eosinophils (Auto) 0 - 6 %     0   Basophils (Auto) 0 - 2 %     1   Absolute Neutrophils (Auto) 1.8 - 7.7 K/uL     2.9   Absolute Lymphocytes (Auto) 1.0 - 4.8 K/uL     1.6   Absolute Monocytes (Auto) 0.1 - 1.0 K/uL     0.3   Absolute Eosinophils (Auto) 0.0 - 0.5 K/uL     0.0   Absolute Basophils (Auto) 0.0 - 0.2 K/uL     0.0   Protime 9.0 - 13.0 sec     16.2 (H)   INR 0.89 - 1.29      1.48 (H)   Glucose  70 - 99 mg/dL     91   BUN 6 - 22 mg/dL     13   Creatinine 0.5 - 1.2 mg/dL     1.0   eGFR >60.0 mL/min/1.73 sq.m.     >60.0   Sodium 133 - 145 mmol/L     142   Potassium 3.5 - 5.5 mmol/L     3.8   Chloride 98 - 110 mmol/L     107   CO2 20 - 32 mmol/L     23   Calcium 8.4 - 10.5 mg/dL     9.3   ANION GAP 3.0 - 15.0 mmol/L     12.0   CREATININE URINE MG/DL mg/dL   404     Troponin (T) Quant High Sensitivity (5th Gen) 0 - 19 ng/L     7   6-Acetylmorphine Screen NDET    NDET     ETHANOL SERUM <=0.02 g/dL     <=0.01   SPECIFIC GRAVITY URINE 1.003 - 1.030    1.031 (H)     pH Urine Drug 4.5 - 8.0    5.5     Amphetamine Screen NDET    NDET     Barbiturates Screen Urine NDET    NDET     Benzodiazepine Screen NDET    NDET     Cannabinoids Screen NDET    DET !     Cocaine Screen NDET    DET !     Hydrocodone Screen Urine NDET    NDET  Opiate  Screen NDET    NDET     Oxycodone Screen NDET    DET !     CHEST PORTABLE     Rpt    EKG 12 LEAD UNIT PERFORMED   Rpt      EDIE VALUES  Rpt       (H): Data is abnormally high  !: Data is abnormal  Rpt: View report in Results Review for more information  Electronically signed by Loraine Maple at 06/20/2022  2:03 PM EST

## 2022-06-20 NOTE — Progress Notes (Signed)
Formatting of this note is different from the original.  Pharmacy Best Possible Medication History (BPMH) Note    Rx Adm Med Hx: Incomplete-needs follow up (trying to get a hold of dr office)  List updated prior to admission med rec?: No    Medication information was obtained from:  electronic claims database and physician's office/clinic  Contact information: INR [phone] PCP [phone]    Medication history interview was conducted: by phone    Med Hx Notes: Updated via surescript, PCP and INR clinic  Targeted PTA Meds: Anticoagulant  Medication: Warfarin      Review Prior to Admission (PTA) medication list for more details.    No Known Allergies    Prior to Admission Medications   Prescriptions Last Dose Patient Reported? Taking?   Aspirin EC 81 mg PO TBEC  Yes No   Sig: Take 1 Tab by Mouth Once a Day.   Pravastatin 40 mg PO TABS  Yes Yes   Sig: Take 1 Tab by Mouth Once a Day.   Note (06/20/2022): Last filled per surescript 04/06/22 for #90   Pregabalin 200 mg PO CAPS  Yes Yes   Sig: Take 1 Cap by Mouth 3 Times Daily.   Note (06/20/2022): Last filled per surescript 06/09/22 for #90   albuterol (PROVENTIL, VENTOLIN, PROAIR) 90 mcg/actuation INH HFAA inhaler PRN Yes Yes   Sig: Take 1 Puff inhaled by mouth Every 6 Hours As Needed for Wheezing or Shortness of Breath.   Note (06/20/2022): Last filled per surescript 05/24/22   alendronate (FOSAMAX) 70 mg PO TABS  Yes Yes   Sig: Take 1 Tab by Mouth Every 7 Days.   Note (06/20/2022): Last filled per surescript 12/27/21 for #4   baclofen 5 mg PO TABS  Yes No   Sig: Take 1 Tab by Mouth Every 6 Hours As Needed (spasms).   Note (06/20/2022): Last filled per surescript 06/14/22 for #90   cetirizine (ZYRTEC) 10 mg PO TABS  Yes No   Sig: Take 1 Tab by Mouth Once a Day.   citalopram (CELEXA) 20 mg PO TABS  Yes Yes   Sig: Take 1.5 Tabs by Mouth Once a Day.   Note (06/20/2022): Last filled per surescript 04/08/22 for #30   dicyclomine (BENTYL) 20 mg PO TABS  Yes No   Sig: Take 1 Tab by  Mouth 3 Times Daily As Needed (IBS).   Note (06/20/2022): Last filled per surescript 04/04/22 for #90    empagliflozin (JARDIANCE) 10 mg PO TABS  Yes Yes   Sig: Take 1 Tab by Mouth Once a Day.   Note (06/20/2022): Last filled per surescript 05/10/22 for #30   fluticasone propionate (FLONASE) 50 mcg/actuation NA SpSn  Yes Yes   Sig: 2 Sprays by each nostril route Once a Day.   Note (06/20/2022): Last filled per surescript 04/12/22   fluticasone-umeclidin-vilanter (TRELEGY ELLIPTA) 100-62.5-25 mcg INH DsDv  Yes No   Sig: Take 1 Puff inhaled by mouth Once a Day.   Note (06/20/2022): Last filled per surescript 04/08/22    furosemide (LASIX) 40 mg PO TABS  Yes Yes   Sig: Take 1 Tab by Mouth Once a Day.   Note (06/20/2022): Last filled per surescript 04/16/22 for #30   hydrOXYzine (ATARAX) 25 mg PO TABS PRN Yes Yes   Sig: Take 1 Tab by Mouth Daily as needed (mood).   Note (06/20/2022): Last filled per surescript 06/03/22 for #30   ipratropium-albuteroL (COMBIVENT RESPIMAT) 20-100 mcg/actuation INH Mist  Yes Yes   Sig: Take 1 Inhalation inhaled by mouth every 6 (six) hours.   Note (06/20/2022): Last filled per surescript 09/03/21   ipratropium-albuteroL (DUONEB) 0.5 mg-3 mg(2.5 mg base)/3 mL INH NEBU  Yes Yes   Sig: Take 3 mL inhaled by mouth every 6 (six) hours.   lamoTRIgine (LAMICTAL) 25 mg PO TABS  Yes Yes   Sig: Take 1 Tab by Mouth Once a Day.   Note (06/20/2022): Last filled per surescript 05/24/22 for #30    lurasidone (LATUDA) 40 mg PO TABS  Yes No   Sig: Take 1 Tab by Mouth Once a Day.   Note (06/20/2022): Last filled per surescript 05/24/22 for #30   metoprolol XL (TOPROL XL) 50 mg PO TB24  Yes Yes   Sig: Take 1 Tab by Mouth Once a Day.   Note (06/20/2022): Last filled per surescript 05/14/22 for #90   nitroglycerin (NITROSTAT) 0.4 mg SL SUBL PRN Yes Yes   Sig: Dissolve 1 Tab under tongue Every 5 Minutes as Needed.   Note (06/20/2022): Last filled per surescript 04/06/22    omeprazole (PRILOSEC OTC) 20 mg PO TBEC  Yes Yes   Sig: Take  1 Tab by Mouth Twice Daily.   Note (06/20/2022): Last filled per surescript 05/10/22 for #30   oxyCODONE-acetaminophen (PERCOCET) 5-325 mg PO TABS  Yes No   Sig: Take 1 Tab by Mouth 3 Times Daily.   Note (06/20/2022): Last filled per surescript and PMP 06/14/22 for qty 21/7 days   polyethylene glycol (MIRALAX) 17 gram PO PwPk  Yes Yes   Sig: Take 1 Packet by Mouth Once a Day.   therapeutic multivitamin PO TABS  Yes Yes   Sig: Take 1 Tab by Mouth Once a Day. womans   tiZANidine (ZANAFLEX) 2 mg PO TABS PRN Yes Yes   Sig: Take 1 Tab by Mouth Daily as needed (spasms).   Note (06/20/2022): Last filled per surescript 06/07/22 for #2   warfarin (COUMADIN) 2.5 mg PO TABS  Yes Yes   Sig: Take 1 Tab by Mouth Once a Day.   Note (06/20/2022): Last filled per surescript 06/04/22 for #270  Dosing per MD 2.5 mg every day. Last INR 06/13/22      Facility-Administered Medications: None     Medications Discontinued During This Encounter   Medication Reason    ipratropium-albuteroL (COMBIVENT RESPIMAT) 20-100 mcg/actuation INH Mist Removed from list: Not a Home Med    Pregabalin 200 mg PO CAPS Removed from list: Not a Home Med    omeprazole (PRILOSEC) 20 mg PO CPDR Removed from list: Not a Home Med    VITAMIN E PO Removed from list: Not a Home Med    docusate sodium (COLACE) 100 mg PO CAPS Removed from list: Not a Home Med       Electronically signed by Josph Macho at 06/20/2022  7:19 PM EST

## 2022-06-20 NOTE — ED Provider Notes (Signed)
Formatting of this note is different from the original.  [place]    Time of Arrival: 06/20/22 1106     [phone]) Suicidal ideation    Assessment: Suicidal ideation    Disposition: Anticipate psychiatric admission pending evaluation by PERS    Differential diagnosis: ACS, severe dehydration, alcohol use    ED Course/Medical Decision Making:  Mary [name] is a 57 y.o. female who presents complaining of PSYCH EVALUATION (SCREENING)  The patient is well and nontoxic appearing on exam, vitals are stable and within normal limits.  Initially was denying any symptoms, when asked mentions mild palpitations.  Screening EKG shows some T wave inversions but high-sensitivity troponin is not elevated.  CBC BMP unremarkable, urine drug screen positive for THC cocaine and oxycodone, ethanol level negative, INR level subtherapeutic at 0.46, chest x-ray with findings of mild bronchitis.  Patient denies chest pain or shortness of breath.  I discussed patient with Roma Kayser and they are going to evaluate her and help to secure a safe disposition.    Discussion of Mangement with other Physicians, QHP or Appropriate Source: Pers    New Prescriptions    No medications on file     Social Determinants of Health:   Social factors reviewed, did not limit treatment     Supplemental Historians include:   Patient     History of Present Illness:  This is a 57 y.o. female who presents complaining of PSYCH EVALUATION (SCREENING)  The patient reports that she left her group home 3 days ago, has been off of her medication sensitive and having worsening suicidal ideation.  She reports that she had a razor and was going to cut her wrists open but decided against it at the last minute decided to come here for help instead.  Did not actually cut herself, denies any other injuries, is otherwise been in her usual state of health.  When asked by EMS she had mentioned that she had a little bit of palpitations  just prior to arrival.  She denies chest pain to me.    Physical Exam  Constitutional:       General: She is not in acute distress.  HENT:      Head: Normocephalic and atraumatic.   Cardiovascular:      Rate and Rhythm: Normal rate.   Pulmonary:      Effort: Pulmonary effort is normal. No respiratory distress.      Breath sounds: No stridor.   Abdominal:      General: There is no distension.      Palpations: Abdomen is soft.      Tenderness: There is no abdominal tenderness.   Musculoskeletal:         General: No deformity.   Skin:     General: Skin is warm and dry.      Findings: No rash.   Neurological:      General: No focal deficit present.      Mental Status: She is alert and oriented to person, place, and time.   Psychiatric:         Thought Content: Thought content includes suicidal ideation. Thought content does not include homicidal ideation. Thought content includes suicidal plan.         Documentation Review: Orthopedic surgery visit 12/29/2021 regarding T6 compression fracture    Vital Signs:  Patient Vitals for the past 24 hrs:   BP Temp Resp SpO2 Height Weight   06/20/22 1119 119/58 98.2  F (36.8 C) 18 97 % 5\' 4"  (1.626 m) 70.8 kg (156 lb)     Diagnostics:  Labs:   Results for orders placed or performed during the hospital encounter of 06/20/22   PT-INR   Result Value Ref Range    Protime 16.2 (H) 9.0 - 13.0 sec    INR 1.48 (H) 0.89 - 1.29   TROPONIN   Result Value Ref Range    Troponin (T) Quant High Sensitivity (5th Gen) 7 0 - 19 ng/L   BASIC METABOLIC PANEL   Result Value Ref Range    Potassium 3.8 3.5 - 5.5 mmol/L    Sodium 142 133 - 145 mmol/L    Chloride 107 98 - 110 mmol/L    Glucose 91 70 - 99 mg/dL    Calcium 9.3 8.4 - 10.5 mg/dL    BUN 13 6 - 22 mg/dL    Creatinine 1.0 0.5 - 1.2 mg/dL    CO2 23 20 - 32 mmol/L    eGFR >60.0 >60.0 mL/min/1.73 sq.m.    Anion Gap 12.0 3.0 - 15.0 mmol/L   ALCOHOL ETHYL   Result Value Ref Range    ETHANOL SERUM <=0.01 <=0.02 g/dL   CBC WITH DIFFERENTIAL AUTO    Result Value Ref Range    WBC 4.8 4.0 - 11.0 K/uL    RBC 4.20 3.80 - 5.20 M/uL    HGB 12.6 11.7 - 16.0 g/dL    HCT 37.9 35.1 - 48.0 %    MCV 90 80 - 99 fL    MCH 30 26 - 34 pg    MCHC 33 31 - 36 g/dL    RDW 12.9 10.0 - 15.5 %    Platelet 286 140 - 440 K/uL    MPV 9.9 9.0 - 13.0 fL    Segmented Neutrophils (Auto) 60 40 - 75 %    Lymphocytes (Auto) 34 20 - 45 %    Monocytes (Auto) 5 3 - 12 %    Eosinophils (Auto) 0 0 - 6 %    Basophils (Auto) 1 0 - 2 %    Absolute Neutrophils (Auto) 2.9 1.8 - 7.7 K/uL    Absolute Lymphocytes (Auto) 1.6 1.0 - 4.8 K/uL    Absolute Monocytes (Auto) 0.3 0.1 - 1.0 K/uL    Absolute Eosinophils (Auto) 0.0 0.0 - 0.5 K/uL    Absolute Basophils (Auto) 0.0 0.0 - 0.2 K/uL   Urine Drug Screen 9   Result Value Ref Range    Amphetamine Screen NDET NDET    Opiate Screen NDET NDET    Cocaine Screen DET (A) NDET    Cannabinoids Screen DET (A) NDET    Barbiturates Screen Urine NDET NDET    Benzodiazepine Screen NDET NDET    Hydrocodone Screen Urine NDET NDET    Oxycodone Screen DET (A) NDET    6-Acetylmorphine Screen NDET NDET    pH Urine Drug 5.5 4.5 - 8.0    SPECIFIC GRAVITY URINE 1.031 (H) 1.003 - 1.030    Creatinine Urine mg/dL 404 mg/dL     ECG:   Results for orders placed or performed during the hospital encounter of 06/20/22   EKG 12 LEAD UNIT PERFORMED   Result Value Ref Range Status    Heart Rate 64 bpm Final    RR Interval 932 ms Final    Atrial Rate 70 ms Final    P-R Interval 151 ms Final    P Duration 119 ms Final  P Horizontal Axis -39 deg Final    P Front Axis 93 deg Final    Q Onset 500 ms Final    QRSD Interval 98 ms Final    QT Interval 447 ms Final    QTcB 463 ms Final    QTcF 457 ms Final    QRS Horizontal Axis -19 deg Final    QRS Axis 20 deg Final    I-40 Front Axis 32 deg Final    t-40 Horizontal Axis -20 deg Final    T-40 Front Axis 30 deg Final    T Horizontal Axis -20 deg Final    T Wave Axis 47 deg Final    S-T Horizontal Axis 185 deg Final    S-T Front Axis 247 deg Final     Impression - ABNORMAL ECG -  Final    Impression   Final     RVAR-Unknown rhythm, irregular rate-V-rate 50-81, variation>10%    Impression   Final     LVHVP-Probable left ventricular hypertrophy-multiple LVH criteria    Impression   Final     T3AN-Abnormal T, consider ischemia, anterior leads-T <-0.107mV, V2-V4    Impression NOECG-No ECG for comparison-  Final     Imaging interpreted by me:  Chest x-ray with no pneumothorax by my read    Subsequent radiology interpretation when available:  CHEST PORTABLE - Chest Pain   Final Result     Cardiomegaly with mild nonspecific diffuse interstitial prominence and left midlung atelectasis. Diagnostic considerations would include nonspecific bronchitis/bronchiolitis, or potentially mild edema.     Signed By: Babs Sciara, MD on 06/20/2022 12:26 PM       EKG 12 LEAD UNIT PERFORMED   Final Result       My rhythm interpretation from monitor:  Sinus rhythm    Disclaimer:  The above was dictated using a computer transcription program.  Although proofread, it may contain computer transcription errors or phonetic errors.  Other human proofreading errors may exist.  Corrections may be performed at a later time.  Please contact for any clarification if needed.    Electronically signed by Reichmeider, Benard Halsted, MD at 06/20/2022 12:46 PM EST

## 2022-06-20 NOTE — ED Triage Notes (Signed)
Formatting of this note might be different from the original.  Pt arrives via EMS unit 1620 from her "trailer" but when EMS arrived she was outside. She had a group home in [place], [place] but left after feeling as though they were lying to her in the home.     Pt has extensive pysch hx and was recently d/c from [facility] for psych issues 3 days ago. After she was d/c she has not been taking any of her psychiatric medications.     She was having SI today which was what prompted her to call 911.     Currently endorsing SI w/ a plan.   Electronically signed by Gomez Cleverly, RN at 06/20/2022 11:19 AM EST

## 2022-06-20 NOTE — Progress Notes (Signed)
Formatting of this note might be different from the original.  PERS Note:  Responded. Consult requested by Dr. Celene Squibb for Pt presenting with SI with intent to cut wrists. Pt called 911 for help. Pt is tearful and has been off psych meds since Thursday 06/16/22 and has been staying with a friend since leaving group home she has resided in. No HI  No AVH.    PERS to see as soon as possible.    Electronically signed by Dorene Grebe, LCSW at 06/20/2022 11:59 AM EST

## 2022-06-20 NOTE — ED Notes (Signed)
Formatting of this note might be different from the original.  Urine collected and sent to lab 1130  Electronically signed by Gomez Cleverly, RN at 06/20/2022 11:34 AM EST

## 2022-06-20 NOTE — Progress Notes (Signed)
Formatting of this note might be different from the original.  PERS Voluntary Bed Search:    [place]: remains under review per [facility] - Dr. Tsosie Billing to monitor behaviors and requests follow up with night shift for final determination (1810)    Note:This note will be updated once facilities become available. A separate note will be placed in pt's chart upon final bed placement.    Lerry Paterson, BSN, RN  PERS Bedflow Coordinator   [id number]  Electronically signed by Michaelle Copas, RN at 37/90/2409  6:21 PM EST

## 2022-06-20 NOTE — ED Notes (Signed)
Formatting of this note might be different from the original.  Pt set up w/ telepsych in family room.   Electronically signed by Gomez Cleverly, RN at 06/20/2022 12:48 PM EST

## 2022-06-20 NOTE — Progress Notes (Signed)
Formatting of this note might be different from the original.  PERS NOTE: Voluntary Bed Search    UPDATE: Pt has been presented internally and is awaiting acceptance/denial, prior to initiation of external bed search.   Electronically signed by Marilu Favre at 06/20/2022 11:38 PM EST

## 2022-06-20 NOTE — ED Notes (Signed)
Formatting of this note might be different from the original.  Pt changed into safety scrubs and belongings collected/recorded by Leonor Liv, ED tech.     Electronically signed by Gomez Cleverly, RN at 06/20/2022 11:40 AM EST

## 2022-06-20 NOTE — ED Notes (Signed)
Formatting of this note might be different from the original.  Pharmacist messaged for delay in care due to not verifying metoprolol.   Electronically signed by Gomez Cleverly, RN at 06/20/2022  4:24 PM EST
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
## 2022-06-20 NOTE — ED Notes (Signed)
Formatting of this note might be different from the original.  Pt requesting pain medication for sciatica pain. Dr. Delorse Lek made aware and added medication per [facility].   Electronically signed by Gomez Cleverly, RN at 06/20/2022  2:47 PM EST

## 2022-06-20 NOTE — Progress Notes (Signed)
Formatting of this note is different from the original.  Pharmacy Best Possible Medication History (BPMH) Note    Rx Adm Med Hx: Complete  List updated prior to admission med rec?: No    Medication information was obtained from:  past medical records and physician's office/clinic  Contact information: Cardiology [phone] PCP [phone]     Medication history interview was conducted: by phone    Med Hx Notes: Updated via surescript, PCP and Cardiology clinic  Targeted PTA Meds: Anticoagulant  Medication: Warfarin      Review Prior to Admission (PTA) medication list for more details.    Allergies   Allergen Reactions    Adhesive Tape-Silicones rash/itching     Prior to Admission Medications   Prescriptions Last Dose Patient Reported? Taking?   Aspirin EC 81 mg PO TBEC  Yes Yes   Sig: Take 1 Tab by Mouth Once a Day.   Pravastatin 40 mg PO TABS  Yes Yes   Sig: Take 1 Tab by Mouth Once a Day.   Note (06/20/2022): Last filled per surescript 04/06/22 for #90   Pregabalin 200 mg PO CAPS  Yes Yes   Sig: Take 1 Cap by Mouth 3 Times Daily.   Note (06/20/2022): Last filled per surescript 06/09/22 for #90   albuterol (PROVENTIL, VENTOLIN, PROAIR) 90 mcg/actuation INH HFAA inhaler PRN Yes Yes   Sig: Take 1 Puff inhaled by mouth Every 6 Hours As Needed for Wheezing or Shortness of Breath.   Note (06/20/2022): Last filled per surescript 05/24/22   alendronate (FOSAMAX) 70 mg PO TABS  Yes Yes   Sig: Take 1 Tab by Mouth Every 7 Days.   Note (06/20/2022): Last filled per surescript 12/27/21 for #4   baclofen (LIORESAL) 10 mg PO TABS  Yes Yes   Sig: Take 1 Tab by Mouth Every 6 Hours As Needed (spasms).   Note (06/20/2022): Last filled per surescript 06/14/22 for #90   cetirizine (ZYRTEC) 10 mg PO TABS  Yes Yes   Sig: Take 1 Tab by Mouth Once a Day.   citalopram (CELEXA) 20 mg PO TABS  Yes Yes   Sig: Take 1.5 Tabs by Mouth Once a Day.   Note (06/20/2022): Last filled per surescript 04/08/22 for #30   cyanocobalamin (VITAMIN B-12) 100 mcg PO  TABS  Yes Yes   Sig: Take 1 Tab by Mouth Once a Day.   docusate sodium (COLACE) 100 mg PO CAPS  Yes Yes   Sig: Take 1 Cap by Mouth Once a Day.   empagliflozin (JARDIANCE) 10 mg PO TABS  Yes Yes   Sig: Take 1 Tab by Mouth Once a Day.   Note (06/20/2022): Last filled per surescript 05/10/22 for #30   ferrous sulfate (FEOSOL) 325 mg (65 mg Iron) PO TABS  Yes Yes   Sig: Take 1 Tab by Mouth Daily With Breakfast.   fluticasone propionate (FLONASE) 50 mcg/actuation NA SpSn  Yes Yes   Sig: 2 Sprays by each nostril route Once a Day.   Note (06/20/2022): Last filled per surescript 04/12/22   fluticasone-umeclidin-vilanter (TRELEGY ELLIPTA) 100-62.5-25 mcg INH DsDv  Yes Yes   Sig: Take 1 Puff inhaled by mouth Once a Day.   furosemide (LASIX) 40 mg PO TABS  Yes Yes   Sig: Take 1 Tab by Mouth Once a Day.   Note (06/20/2022): Last filled per surescript 04/16/22 for #30   hydrOXYzine (ATARAX) 25 mg PO TABS PRN Yes Yes   Sig: Take 1 Tab by  Mouth Daily as needed (mood).   Note (06/20/2022): Last filled per surescript 06/03/22 for #30   ipratropium-albuteroL (COMBIVENT RESPIMAT) 20-100 mcg/actuation INH Mist  Yes Yes   Sig: Take 1 Inhalation inhaled by mouth every 6 (six) hours.   Note (06/20/2022): Last filled per surescript 09/03/21   ipratropium-albuteroL (DUONEB) 0.5 mg-3 mg(2.5 mg base)/3 mL INH NEBU  Yes Yes   Sig: Take 3 mL inhaled by mouth every 6 (six) hours.   lamoTRIgine (LAMICTAL) 25 mg PO TABS  Yes Yes   Sig: Take 1 Tab by Mouth Once a Day.   Note (06/20/2022): Last filled per surescript 05/24/22 for #30    lidocaine (SALONPAS) 4 % Top PtMd PRN Yes Yes   Sig: Apply 1 Patch as directed Daily as needed (pain).   lurasidone (LATUDA) 40 mg PO TABS  Yes Yes   Sig: Take 1 Tab by Mouth Once a Day.   Note (06/20/2022): Last filled per surescript 05/24/22 for #30   metoprolol XL (TOPROL XL) 50 mg PO TB24  Yes Yes   Sig: Take 1 Tab by Mouth Once a Day.   Note (06/20/2022): Last filled per surescript 05/14/22 for #90   nitroglycerin (NITROSTAT)  0.4 mg SL SUBL PRN Yes Yes   Sig: Dissolve 1 Tab under tongue Every 5 Minutes as Needed.   Note (06/20/2022): Last filled per surescript 04/06/22    omeprazole (PRILOSEC OTC) 20 mg PO TBEC  Yes Yes   Sig: Take 1 Tab by Mouth Twice Daily.   Note (06/20/2022): Last filled per surescript 05/10/22 for #30   oxyCODONE-acetaminophen (PERCOCET) 5-325 mg PO TABS  Yes No   Sig: Take 1 Tab by Mouth 3 Times Daily.   Note (06/20/2022): Last filled per surescript and PMP 06/14/22 for qty 21/7 days   polyethylene glycol (MIRALAX) 17 gram PO PwPk  Yes Yes   Sig: Take 1 Packet by Mouth Once a Day.   therapeutic multivitamin PO TABS  Yes Yes   Sig: Take 1 Tab by Mouth Once a Day. womans   tiZANidine (ZANAFLEX) 2 mg PO TABS PRN Yes Yes   Sig: Take 1 Tab by Mouth Daily as needed (spasms).   Note (06/20/2022): Last filled per surescript 06/07/22 for #2   traZODone 150 mg PO TABS  Yes Yes   Sig: Take 0.5 Tabs by Mouth Every Night at Bedtime.   ubrogepant (UBRELVY) 100 mg PO TABS PRN Yes Yes   Sig: Take 1 Tab by Mouth Daily as needed (migraines, may repeat in 2 hours if no change). No more than 2 days in a week   warfarin (COUMADIN) 2.5 mg PO TABS  Yes Yes   Sig: Take 1 Tab by Mouth Once a Day.   Note (06/20/2022): Last filled per surescript 06/04/22 for #270  Dosing per MD 2.5 mg every day. Last INR 06/13/22      Facility-Administered Medications: None     Medications Discontinued During This Encounter   Medication Reason    ipratropium-albuteroL (COMBIVENT RESPIMAT) 20-100 mcg/actuation INH Mist Removed from list: Not a Home Med    Pregabalin 200 mg PO CAPS Removed from list: Not a Home Med    omeprazole (PRILOSEC) 20 mg PO CPDR Removed from list: Not a Home Med    VITAMIN E PO Removed from list: Not a Home Med    docusate sodium (COLACE) 100 mg PO CAPS Removed from list: Not a Home Med    fluticasone-umeclidin-vilanter (TRELEGY ELLIPTA) 100-62.5-25 mcg INH DsDv  Removed from list: Not a Home Med    citalopram (CeleXA) tablet 20 mg     pregabalin  (Lyrica) capsule 200 mg     nicotine (Nicoderm CQ) 7 mg/24 hr 1 Patch     traZODone (DesyreL) tablet 50 mg     dicyclomine (BENTYL) 20 mg PO TABS Removed from list: Not a Home Med       Electronically signed by Josph Macho at 06/21/2022 12:35 PM EST

## 2022-06-20 NOTE — Progress Notes (Signed)
Formatting of this note might be different from the original.  Following PERS assessment, this patient is vol for inpatient. She reports SI with a plan to cut her wrists, recent attempt by intentional overdose on muscle relaxers, Sleep and appetite disturbances, confirmed drug and alcohol abuse. Patient stated that she has not been on psych meds in the past 3 days and feels increasingly depressed. Dr. Delorse Lek notified of disposition    Electronically signed by Loraine Maple at 06/20/2022  1:07 PM EST

## 2022-06-20 NOTE — ED Provider Notes (Signed)
Formatting of this note might be different from the original.  Patient received as turnover from Dr. Delorse Lek. Please see initial provider note for full history and physical. Briefly, patient is a 57 y.o. year old female with a past medical history of suicidal ideation who presented with suicidal ideation.     During this shift:  Patient voluntary for inpatient treatment pending bed search      Electronically signed by Samara Deist, MD at 06/20/2022 11:37 PM EST

## 2022-06-20 NOTE — ED Notes (Signed)
Formatting of this note might be different from the original.  Patient is undressed and in safety scrubs  Belongings have been removed and secured    Belongs list completed: Yes    Patients belongings are located in [place] #. [address]    Patient has valuables stored with SECURITY: No    Patient has medications stored in INPATIENT PHARMACY: No   Electronically signed by Gomez Cleverly, RN at 06/20/2022 11:41 AM EST

## 2022-06-20 NOTE — Progress Notes (Signed)
Formatting of this note might be different from the original.  PERS NOTE Voluntary Bed Search:      Request for [facility] bed search, 1405.  Epad completed.       BHU: Pt presented to [facility] for consideration and review, [place]  3532: Follow up with [facility].  Pt not yet reviewed.    Passing to oncoming shift for continued bed search and follow up.      Note: This note will be updated once facilities become available.  A separate note will be placed in patients chart upon final bed placement.      Lendon Colonel Bed Flow Coordinator    Electronically signed by Collier Flowers at 06/20/2022  3:18 PM EST

## 2022-06-20 NOTE — ED Notes (Signed)
Formatting of this note might be different from the original.  Report given to [place], Therapist, sports. I relinquish care at this time.   Electronically signed by Gomez Cleverly, RN at 06/20/2022  7:13 PM EST

## 2022-06-20 NOTE — ED Notes (Signed)
Formatting of this note might be different from the original.  Pt refusing vitamin at this time as she only wants her Lyrica.     Spoke to Dr. Delorse Lek about pt's request to take it now, but due to the set schedule of 2x a day will wait for night time dose. MD looked at pt's record for medication, and it is only due twice a day.     Pt states she takes it three times a day at 0400, 1200, and 1600. MD made aware of schedule.     Pt upset she is not getting medication now, stating we are making her sick by not providing medication.     Explained the MD decision process to pt.   Electronically signed by Gomez Cleverly, RN at 06/20/2022  3:48 PM EST

## 2022-06-20 NOTE — Consults (Signed)
Associated Order(s): CONSULT TO PERS  Formatting of this note might be different from the original.  PERS Note:      Pt has been accepted to [facility] by Dr. Arnetha Courser. ED provider and primary nurse made aware.      Please call report to [phone].      Please have pt sign Vol Consent Form and scan into record and the original brought with pt to [facility].    Jazmin, RN  Quarry manager  [phone].HOPE    Electronically signed by Ozella Rocks, RN at 06/20/2022 11:53 PM EST

## 2022-06-21 ENCOUNTER — Telehealth

## 2022-06-21 NOTE — Group Note (Signed)
Formatting of this note is different from the original.  Group Topic: BH - Refused Therapy  Group Date: 06/21/2022  Start Time: 1030  End Time: 1130  Facilitators: Conni Elliot  Department: [facility]    Number of Participants Refused: 8  Group Focus: art therapy, coping skills, leisure skills, relaxation, and self-awareness  Treatment Modality: Art Therapy and Leisure Development  Interventions utilized were exploration and leisure development  Purpose: enhance coping skills  Group Activity: Group engaged in Risk analyst where participants were given paintbrushes, canvas paper, and watercolor to express themselves in the form of art. Participants were encouraged to paint a nature scene, loved ones, abstract art, or to paint what the music is making them feel. Music was played in the background to foster a therapeutic environment.    Name: Mary [name]  Date of Birth: [birth year]  MR: [id number]    Patient's Problems:   Active Hospital Problems    Diagnosis    Suicidal ideation     Progress toward Goals:  Patient did not attend - declined. Conni Elliot approached patient in the patient's room and encouraged group attendance, but patient declined two times. Alternative task and/or 1:1 activity with staff offered, but patient continued to decline. Patient's nurse and treatment team notified that patient is declining to attend groups.    Plan: continue with current plan    Yvetta Coder. Honaker, CTRS      Electronically signed by Conni Elliot at 06/21/2022 11:22 AM EST

## 2022-06-21 NOTE — Care Plan (Signed)
Formatting of this note might be different from the original.    Problem: General - Behavioral Health Therapist  Goal: STG: By 7th day, Rosanne will meet with staff at least 1 time for at least 10 minutes to identify at a minimum 1 follow up provider and sign a release of information to discharge planning.   Outcome: Progressing  Goal: STG: By 7th day, Merina will meet with staff 1 time for at least 15 minutes to complete a safety plan prior to discharge to promote complete and safe discharge.   Outcome: Progressing  Goal: LTG: By 14th day, Hargun will complete at least 75% of a safety plan (verbal/written) including 2 coping skills in case of increased depression, suicidal ideation, and mood instability.  Outcome: Not Progressing    Problem: Depression - Behavioral Health Therapist  Goal: STG: By 7th day, Shabree will engage with staff in a group setting at least 3 therapy groups/week for 30 mins. to increase positive coping skills to promote depressive symptom reduction.  Outcome: Progressing    Problem: Suicide Risk/Self Harm - Behavioral Health Therapist  Goal: STG: By 7th day, Deborha will engage with staff in a group setting at least 3 therapy groups/week for 30 mins. to be able to express their negative emotions in a positive manner.  Outcome: Progressing    Problem: Substance Abuse - Behavioral Health Therapist  Goal: STG: By 7th day, Tilda will participate in at least 3 therapy groups/week for 30 mins. to increase positive coping skills to promote a reduction in maladaptive patterns.  Outcome: Progressing    Writer met with patient to complete assessment. BHT will continue to follow for treatment plan revision, supportive counseling, and discharge planning.     Luz Brazen, MA, LPC-R  Electronically signed by Luz Brazen at 06/21/2022  1:30 PM EST

## 2022-06-21 NOTE — Progress Notes (Signed)
Formatting of this note might be different from the original.  Nursing Admission Screen     Mary [name] is a 57 y.o. admitted for Suicidal ideation [R45.851].   Admission type: voluntary.    Information for this history was provided by patient and past medical records    The following language barriers exist:none . Mary [name] primary language is English. The family speaks [place]. Services needed include: none . An interpreter is not required  to facilitate communication with Mary [name], her family and other support systems.     Mary [name] was oriented to: telephone , bathroom , and visiting hours .    Mary [name] was too tired to fully partake in the health history questionnaire.  She became tearful, stating she just wants to get better; to not have to live with so many struggles.  She denies wanting to harm herself here on this unit.  Her cssrs is low.  MD and RN discussed patient several times prior to acceptance.  Suicide precautions are initiated including every fifteen minute rounding.  The tour and full explanation of visiting hours will have to be covered during the daytime.      Electronically signed by Kathalene Frames, RN at 06/21/2022  1:57 AM EST

## 2022-06-21 NOTE — Progress Notes (Signed)
Formatting of this note is different from the original.     06/21/22 1016   General Information, [place]   Admission Status Voluntary   Arrived From emergency department   Referral Source emergency department   Preferred Language English   Interpreter Used During This Interaction no   Reason for Admission Suicidal attempt   Patient stated reason for admission Patient stated the reason for her admission was due to her suicidal attempt.   How long have you been feeling this way? 5+ years   Rate the intensity of the problem 3   How is the problem interfering with day-today functioning Patient denies having a day to day functioning interference.   Are you experiencing any of the following symptoms (Last 30 days) Sadness;Suicidal Thoughts;Worthlessness/Esteem;Worrying;Headache/Body Aches   What is your current treatment goal? Patient stated that she would like a med adjustment.       Electronically signed by Carolan Clines at 06/21/2022 11:26 AM EST

## 2022-06-21 NOTE — Telephone Encounter (Signed)
-----   Message from Creola Corn, MD sent at 06/19/2022  6:17 PM EST -----  Call pt. Ordered IR eval for additional kyphoplasty.

## 2022-06-21 NOTE — Initial Assessments (Signed)
Formatting of this note is different from the original.  Recreational Therapy Assessment     Leisure Activities: Pt was unable to give an answer.    Level of Self Esteem identified by Patient: Pt was unable to give an answer.    Strengths / Talents / Special Abilities: Pt was unable to give an answer.    Asset Inventory as identified by CTRS: Pt is voluntary admission.    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    Positive ways to control negative thoughts and behaviors while in the hospital:  Pt was encouraged to attend groups and seek out staff as needed.    Patient Identified Treatment Goals: "I don't know."    Summary: Pt was in her room on approach. Was cooperative and made good eye contact during interview. Affect appeared calm and mood seemed pleasant. Speech was clear and thought process seemed logical. Grooming and hygiene seemed good. Pt has not attended RT group(s). Interactions with peers were not observed. Noted to be on q15 precautions at this time.      Patient was oriented to RT services provided.  Treatment goals were discussed and established. Please also refer to the Recreational Therapy Checklist under the Doc Flowsheets for additional assessment information.    Yvetta Coder. Honaker, CTRS    Electronically signed by Conni Elliot at 06/21/2022  1:52 PM EST
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
## 2022-06-21 NOTE — Care Plan (Signed)
Formatting of this note might be different from the original.    Problem: General - Therapeutic Services  Goal: LTG: By discharge, Patrycja will meet with therapeutic services staff one time to for at least 5 minutes and verbalize 2-3 healthy, leisure-based coping skills to reduce/manage negative thoughts and feelings, prior to discharge.  Outcome: Not Progressing  Goal: LTG: By discharge, Kiarrah will meet with therapeutic services staff 1 time to for at least 10 minutes to work on identifying at least 1 resource for support and developing an appropriate discharge plan.  Outcome: Not Progressing  Goal: LTG: By discharge, Traniya will meet with therapy services staff for at least one time for 5 minutes to identify two non-pharmalogical interventions complimentary to their treatment plan to promote affective stability on an outpatient basis.  Outcome: Not Progressing  Goal: LTG: By discharge, Shereda will meet with therapy services staff for at least one time for 5 minutes to be able to identify 2 healthy and unbiased social supports they can reach out to when experiencing negative symptoms on an outpatient basis.  Outcome: Not Progressing    Problem: Suicide Risk/Self Harm - Therapeutic Activities  Goal: STG: By 7th day, Adrina will engage in 5 groups/week for at least 15 mins. identify 1 coping skill to utilize to promote safety after discharge.  Outcome: Not Progressing  Goal: STG: By 7th day, Dynver will engage in 5 groups/week for at least 15 mins. to identity at least 1 existing trigger to promote self-awareness.  Outcome: Not Progressing  Goal: STG: By 7th day, Hiroko will engage in 5 groups/week for at least 15 mins. to promote symptom reduction of suicidal ideations.  Outcome: Not Progressing    Yvetta Coder. Honaker, CTRS      Electronically signed by Conni Elliot at 06/21/2022  1:55 PM EST

## 2022-06-21 NOTE — Telephone Encounter (Signed)
Attempted to contact the pt regarding her MRI results. She was not able to be reached. A message was left for the pt letting her know that there is a new fracture in her mid back. I also let her know that a new referral for interventional radiology has been ordered. She will get a call from them to schedule the appt. The number to our office was provided if she has any questions or concerns.

## 2022-06-21 NOTE — Group Note (Signed)
Formatting of this note is different from the original.  Group Topic: BH - Healthy Communication  Group Date: 06/21/2022  Start Time: 1300  End Time: 1400  Facilitators: Dorice Lamas  Department: [place]    Number of Participants: 7  Group Focus: communication  Treatment Modality: Cognitive Behavioral Therapy and Skills Training  Interventions utilized were assignment and group exercise  Purpose: improve communication skills  Group Activity: Group engaged in discussion about passive, aggressive and assertive communication.    Name: Mary [name]  Date of Birth: 28-Mar-1966  MR: [id number]    Patient's Problems:   Active Hospital Problems    Diagnosis    Suicidal ideation     Level of Participation:  moderate  Quality of Participation: attentive, cooperative, and engaged  Attendance: Stayed for duration  Interactions with others: appropriate  Mood/Affect: appropriate  Triggers (if applicable): None noted  Cognition: coherent/clear, insightful, and logical  Progress: Moderate  Response: pt was attentive and appropriate during group discussion  Plan: continue with current plan      Electronically signed by Carolan Clines at 06/21/2022  2:33 PM EST

## 2022-06-21 NOTE — Care Plan (Signed)
Formatting of this note might be different from the original.    Problem: Adult Inpatient Plan of Care  Goal: Plan of Care Review  Outcome: Progressing  Goal: Patient-Specific Goal (Individualized)  Description: By the 7th day:    Donika will report at least 6 hours of uninterrupted sleep each morning    Ramonia will eat at least 75% of meals and have adequate fluid intake at least twice a day    Sherian will remain safe from self-harm and remain free of self-inflicted injury at all times.    Aadhira will discuss signs and symptoms of SI at least once per shift 1:1 with staff.    Donnamae will verbalize what medication(s) have been prescribed and why they are important to adhere to taking medication at least once daily.    Shawnelle will verbalize at least 2 new coping skills he/she has learned to manage stressors at least once daily    Morgan will adhere to fall precautions to ensure safety and to be free from falls every shift.    Annalia will have adequate pain management.    Outcome: Progressing  Goal: Absence of Hospital-Acquired Illness or Injury  Outcome: Progressing  Goal: Optimal Comfort and Wellbeing  Outcome: Progressing  Goal: Readiness for Transition of Care  Outcome: Progressing    Problem: General - Nursing  Goal: STG: By 7th day, Marguerita will report 6 hours of uninterrupted sleep per night on 2 consecutive nights in order to promote rest and a healthy sleep/wake cycle.   Outcome: Progressing  Goal: STG: By 7th day, Aloni will eat 75% of 3 meals per day for 2 days in order to promote healthy nutritional status.   Outcome: Progressing  Goal: STG: By 7th day, Gizella will verbalize understanding of education about medications two times to promote safety.   Outcome: Progressing  Goal: STG: By 7th day, Deannah will remain free from falls and new injuries/illnesses daily to promote safety.   Outcome: Progressing  Goal: STG: By 7th day, Lolamae will complete/participate with ADLs to promote healthy hygiene at least once per day for  2 consecutive days.  Outcome: Progressing  Goal: LTG: By discharge, Cary will acknowledge understanding of discharge instructions one time prior to discharge to promote safety after discharge.   Outcome: Progressing  Goal: LTG: By discharge, Braxton will acknowledge understanding of discharge medication regimen one time prior to discharge to promote medication safety and adherence.  Outcome: Progressing  Goal: LTG: By discharge, Jancy will report to staff at least one time 2 benefits of sleeping at least 6 hours to promote the healthy sleep/wake cycle after discharge.  Outcome: Progressing  Goal: LTG: By discharge, Vona will report to staff at least one time 2 benefits of healthy nutritional intake in order to promote a healthy lifestyle post discharge.   Outcome: Progressing    Problem: Pain - Nursing  Goal: STG: By 7th day, Jemima will report an acceptable pain level of 5 or less on 3 consecutive assessments to demonstrate effective pain control.  Outcome: Progressing  Goal: STG: By 7th day, Avelina will identify & use at least one non-medicinal pain reducing option to meet acceptable pain level of 5 at least 1 time every 8 hrs.  Outcome: Progressing    Problem: Depression - Nursing  Goal: STG: By 7th day, Suleima will engage in therapeutic milieu for 1) or more hours per day for 2 consecutive days.  Outcome: Progressing  Goal: STG: By 7th day, Keyanni will decrease isolation  by increasing time out of their room for  2 hours between the times of 0800 - 2200.    Outcome: Progressing  Goal: STG: By 7th day, Denell will identify 2 triggers of depression with staff once every 12 hours for 2 consecutive days.   Outcome: Progressing    Problem: Suicide Risk/Self Harm - Nursing  Goal: STG: By discharge, Gricelda will communicate any thoughts of suicide and/or self-harm at least once every shift during one to one with staff member.   Outcome: Progressing  Goal: STG: By discharge, Tywana will have no attempts or threats of self-harm  for 24 hours.  Outcome: Progressing  Goal: STG: By discharge, Ziaire will report reduction in suicidal ideations on 2 consecutive shift assessments.  Outcome: Progressing  Goal: STG: By discharge, Jamielynn will deny suicidal ideations, plan, and intent on 2 consecutive shift assessments 24 hours.  Outcome: Progressing    Problem: Anxiety - Nursing  Goal: STG: By 7th day, Arnika will utilize no PRN medications for anxiety in one 24-hour period.   Outcome: Progressing  Goal: STG: By 7th day, Dlynn will identify level of anxiety with staff at least once every 12 hours for 2 consecutive days.  Outcome: Progressing  Goal: LTG: By discharge, Kyleigh will require no PRN anxiety medication in the final 24 hours of the admission to promote safety and comfort after discharge.  Outcome: Progressing  Goal: LTG: By discharge, Vicki will implement one identified coping skill when feeling anxious as first option to relieve anxiety at least once.  Outcome: Progressing      Med/meal compliant.  No group.  She said she wasn't feeling well this morning.  She asked about her Lyrica and that it needs to be TID and she said she normally takes Oxy and she was going to need it for her back pain.  She does not interact.  Denies all.  Will continue to monitor for safety.  Electronically signed by Thurnell Lose, RN at 06/21/2022  9:17 AM EST

## 2022-06-21 NOTE — Progress Notes (Signed)
Formatting of this note is different from the original.     06/21/22 1126   Discharge Needs Assessment, [place]   Readmission Within the Last 30 Days lack of support   Concerns to be Addressed discharge planning;homelessness;home safety   Patient/Family Anticipates Transition to shelter;long-term care facility   Patient/Family Anticipated Services at Transition none   Transportation Concerns no car   Transportation Anticipated agency   Outpatient/Agency/Support Group Needs assisted living facility;group home   Anticipated Discharge Disposition group home   Current Discharge Risk lack of support system/caregiver       Electronically signed by Carolan Clines at 06/21/2022 12:19 PM EST

## 2022-06-21 NOTE — Group Note (Signed)
Formatting of this note is different from the original.  Group Topic: BH - Refused Therapy  Group Date: 06/21/2022  Start Time: [place]  End Time: 1530  Facilitators: Conni Elliot  Department: [place]    Number of Participants Refused: 7  Group Focus: coping skills, family, healthy friendships, leisure skills, and self-awareness  Treatment Modality: Leisure Development  Interventions utilized were assignment, exploration, leisure development, and other leisure education  Purpose: enhance coping skills, increase insight, reinforce self-care, and Increase leisure awareness.  Group Activity: Group engaged in discussion on the importance of leisure and how it benefits [place] in our daily living. Participants were asked questions about leisure in their own lives before working through a leisure exploration worksheet as a group. Participants filled out the worksheet and then discussed the application of leisure in their life and what they learned about themselves through the worksheet and discussion.    Name: Zoria Rawlinson  Date of Birth: Jul 20, 1965  MR: [id number]    Patient's Problems:   Active Hospital Problems    Diagnosis    Suicidal ideation     Progress toward Goals:  Patient did not attend - declined. Conni Elliot approached patient in the patient's room and encouraged group attendance, but patient declined two times. Alternative task and/or 1:1 activity with staff offered, but patient continued to decline. Patient's nurse and treatment team notified that patient is declining to attend groups.    Plan: continue with current plan    Yvetta Coder. Honaker, CTRS      Electronically signed by Conni Elliot at 06/21/2022  2:55 PM EST

## 2022-06-21 NOTE — H&P (Signed)
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 Duration 119    P Horizontal Axis -39    P Front Axis 93    Q Onset 500    QRSD Interval 98    QT Interval 447    QTcB 463    QTcF 457    QRS Horizontal Axis -19    QRS Axis 20    I-40 Front Axis 32    t-40 Horizontal Axis -20    T-40 Front Axis 30    T Horizontal Axis -20    T Wave Axis 47    S-T Horizontal Axis 185    S-T Front Axis 247    Impression - ABNORMAL ECG -    Impression      RVAR-Unknown rhythm, irregular rate-V-rate 50-81, variation>10%    Impression  LVHVP-Probable left ventricular hypertrophy-multiple LVH criteria    Impression      T3AN-Abnormal T, consider ischemia, anterior leads-T <-0.5920mV, V2-V4    Impression NOECG-No ECG for comparison-   Results for orders placed or performed during the hospital encounter of 02/07/22   EKG 12-LEAD   Result Value    Heart Rate 60    RR Interval 1,000    Atrial Rate 59    P-R Interval 149    P Duration 118    P Horizontal Axis -13    P Front Axis 84    Q Onset 500    QRSD Interval 96    QT Interval 455    QTcB 455    QTcF 455    QRS Horizontal Axis -15    QRS Axis 54    I-40 Front Axis 64    t-40 Horizontal Axis -19    T-40 Front Axis 45    T Horizontal Axis -16    T Wave Axis 60    S-T Horizontal Axis 176    S-T Front Axis -84    Impression - ABNORMAL ECG -    Impression SR-Sinus rhythm-normal P axis, V-rate 50-99    Impression      PLAE-Probable left atrial enlargement-P >4050mS, <-0.8110mV V1     Impression      LVH1-Left ventricular hypertrophy-multiple LVH criteria    Impression      T3AN-Abnormal T, consider ischemia, anterior leads-T <-0.3520mV, V2-V4    Impression -similar to prior-     3-1.   Recent Results (from the past 336 hour(s))   1. CHEST PORTABLE - Chest Pain    Narrative    EXAM: CHEST RADIOGRAPH    CLINICAL INDICATION/HISTORY: Provided with order -   CHEST PAIN    > Additional: None    COMPARISON: None    TECHNIQUE: Portable frontal view of the chest    _______________    FINDINGS:    SUPPORT DEVICES: None.    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 Midline enlarged cardiac silhouette. Unremarkable mediastinal hilar contours    LUNGS AND PLEURAL SPACES: Bilateral diffuse coarse interstitial markings with minimal peripheral left midlung asymmetric linear opacities. No consolidation, mass or effusion.    BONY THORAX AND SOFT TISSUES: Unremarkable.    _______________     Impression    Cardiomegaly with mild nonspecific diffuse interstitial prominence and left midlung atelectasis. Diagnostic considerations would include nonspecific bronchitis/bronchiolitis, or potentially mild edema.    Signed By: Epimenio FootAndrew P Loiacono, MD on 06/20/2022 12:26 PM    2. EKG 12 LEAD UNIT PERFORMED   Result Value Ref Range    Heart Rate 64 bpm    RR Interval 932 ms    Atrial Rate 70 ms    P-R Interval 151 ms    P Duration 119 ms    P Horizontal Axis -39 deg    P Front Axis 93 deg    Q Onset 500 ms    QRSD Interval 98 ms    QT Interval 447 ms    QTcB 463 ms    QTcF 457 ms    QRS Horizontal Axis -19 deg    QRS Axis 20 deg    I-40 Front Axis 32 deg    t-40 Horizontal Axis -20 deg    T-40 Front Axis 30 deg    T Horizontal Axis -20 deg    T Wave Axis 47 deg  S-T Horizontal Axis 185 deg    S-T Front Axis 247 deg    Impression - ABNORMAL ECG -     Impression       RVAR-Unknown rhythm, irregular rate-V-rate 50-81, variation>10%    Impression       LVHVP-Probable left  ventricular hypertrophy-multiple LVH criteria    Impression       T3AN-Abnormal T, consider ischemia, anterior leads-T <-0.15mV, V2-V4    Impression NOECG-No ECG for comparison-      Gerlene Burdock, MD   Psychiatrist    Note: this chart I was prepared using voice-recognition software and may contain unintended word substitution errors.      Electronically signed by Gerlene Burdock, MD at 06/21/2022 12:04 PM EST

## 2022-06-21 NOTE — Care Plan (Signed)
Formatting of this note might be different from the original.  By the 7th day:     Mary [name] will report at least 6 hours of uninterrupted sleep each morning   Mary [name] will eat at least 75% of meals and have adequate fluid intake at least twice a day   Mary [name] will remain safe from self-harm and remain free of self-inflicted injury at all times.   Mary [name] will discuss signs and symptoms of SI at least once per shift 1:1 with staff.   Mary [name] will verbalize what medication(s) have been prescribed and why they are important to adhere to taking medication at least once daily.   Mary [name] will verbalize at least 2 new coping skills he/she has learned to manage stressors at least once daily   Mary [name] will adhere to fall precautions to ensure safety and to be free from falls every shift.   Mary [name] will have adequate pain management.   Electronically signed by Kathalene Frames, RN at 06/21/2022  2:05 AM EST

## 2022-06-21 NOTE — Care Plan (Signed)
Formatting of this note might be different from the original.  Pt med compliant; received PRN Trazodone upon request; effective. Denies all except for +6/10 chronic pain re back and left leg, +depression, +anxiety. Reports attended 3 groups, showered today, no phone calls or visitation. Ambulating appropriately with walker. Flat affect. Verbalizes "Overall feeling so-so" visible in lounge eating snack.     Problem: Adult Inpatient Plan of Care  Goal: Plan of Care Review  Outcome: Progressing  Goal: Patient-Specific Goal (Individualized)  Description: By the 7th day:    Mary [name] will report at least 6 hours of uninterrupted sleep each morning    Mary [name] will eat at least 75% of meals and have adequate fluid intake at least twice a day    Mary [name] will remain safe from self-harm and remain free of self-inflicted injury at all times.    Mary [name] will discuss signs and symptoms of SI at least once per shift 1:1 with staff.    Mary [name] will verbalize what medication(s) have been prescribed and why they are important to adhere to taking medication at least once daily.    Mary [name] will verbalize at least 2 new coping skills he/she has learned to manage stressors at least once daily    Mary [name] will adhere to fall precautions to ensure safety and to be free from falls every shift.    Mary [name] will have adequate pain management.    Outcome: Progressing  Goal: Absence of Hospital-Acquired Illness or Injury  Outcome: Progressing  Goal: Optimal Comfort and Wellbeing  Outcome: Progressing  Goal: Readiness for Transition of Care  Outcome: Progressing    Problem: General - Nursing  Goal: STG: By 7th day, Mary [name] will report 6 hours of uninterrupted sleep per night on 2 consecutive nights in order to promote rest and a healthy sleep/wake cycle.   Outcome: Progressing  Goal: STG: By 7th day, Mary [name] will eat 75% of 3 meals per day for 2 days in order to promote healthy nutritional status.   Outcome: Progressing  Goal: STG: By 7th day, Mary [name] will verbalize  understanding of education about medications two times to promote safety.   Outcome: Progressing  Goal: STG: By 7th day, Mary [name] will remain free from falls and new injuries/illnesses daily to promote safety.   Outcome: Progressing  Goal: STG: By 7th day, Mary [name] will complete/participate with ADLs to promote healthy hygiene at least once per day for 2 consecutive days.  Outcome: Progressing  Goal: LTG: By discharge, Mary [name] will acknowledge understanding of discharge instructions one time prior to discharge to promote safety after discharge.   Outcome: Progressing  Goal: LTG: By discharge, Mary [name] will acknowledge understanding of discharge medication regimen one time prior to discharge to promote medication safety and adherence.  Outcome: Progressing  Goal: LTG: By discharge, Mary [name] will report to staff at least one time 2 benefits of sleeping at least 6 hours to promote the healthy sleep/wake cycle after discharge.  Outcome: Progressing  Goal: LTG: By discharge, Mary [name] will report to staff at least one time 2 benefits of healthy nutritional intake in order to promote a healthy lifestyle post discharge.   Outcome: Progressing    Problem: Pain - Nursing  Goal: STG: By 7th day, Mary [name] will report an acceptable pain level of 5 or less on 3 consecutive assessments to demonstrate effective pain control.  Outcome: Progressing  Goal: STG: By 7th day, Mary [name] will identify & use at least one non-medicinal pain reducing option to meet acceptable pain level of 5 at least  1 time every 8 hrs.  Outcome: Progressing    Problem: Depression - Nursing  Goal: STG: By 7th day, Mary [name] will engage in therapeutic milieu for 1) or more hours per day for 2 consecutive days.  Outcome: Progressing  Goal: STG: By 7th day, Mary [name] will decrease isolation by increasing time out of their room for  2 hours between the times of 0800 - 2200.    Outcome: Progressing  Goal: STG: By 7th day, Mary [name] will identify 2 triggers of depression with staff once every 12  hours for 2 consecutive days.   Outcome: Progressing    Problem: Suicide Risk/Self Harm - Nursing  Goal: STG: By discharge, Mary [name] will communicate any thoughts of suicide and/or self-harm at least once every shift during one to one with staff member.   Outcome: Progressing  Goal: STG: By discharge, Mary [name] will have no attempts or threats of self-harm for 24 hours.  Outcome: Progressing  Goal: STG: By discharge, Mary [name] will report reduction in suicidal ideations on 2 consecutive shift assessments.  Outcome: Progressing  Goal: STG: By discharge, Mary [name] will deny suicidal ideations, plan, and intent on 2 consecutive shift assessments 24 hours.  Outcome: Progressing    Problem: Anxiety - Nursing  Goal: STG: By 7th day, Mary [name] will utilize no PRN medications for anxiety in one 24-hour period.   Outcome: Progressing  Goal: STG: By 7th day, Mary [name] will identify level of anxiety with staff at least once every 12 hours for 2 consecutive days.  Outcome: Progressing  Goal: LTG: By discharge, Mary [name] will require no PRN anxiety medication in the final 24 hours of the admission to promote safety and comfort after discharge.  Outcome: Progressing  Goal: LTG: By discharge, Mary [name] will implement one identified coping skill when feeling anxious as first option to relieve anxiety at least once.  Outcome: Progressing    Electronically signed by Ezekiel Ina, RN at 06/21/2022 11:02 PM EST

## 2022-06-22 NOTE — Group Note (Signed)
Formatting of this note is different from the original.  Group Topic: BH - Healthy Leisure Skills  Group Date: 06/22/2022  Start Time: 1030  End Time: 1130  Facilitators: Conni Elliot  Department: [place]    Number of Participants: 7  Group Focus: communication, community group, coping skills, feeling awareness/expression, leisure skills, music therapy, and social skills  Treatment Modality: Leisure Development  Interventions utilized were leisure development and storytelling  Purpose: enhance coping skills, express feelings, and reinforce self-care  Group Activity: Group engaged in Higher education careers adviser Assisted Therapy with Skippy the therapy dog while also engaging in open recreation. Participants were encouraged to hold Skippy, pet him, and engage with him. Participants were also given coloring sheets, word searches, sudoku, and multiple board games to engage in with their peers while listening to uplifting music that they can request.     Name: Cherylyn Sundby  Date of Birth: 10/21/1965  MR: [id number]    Patient's Problems:   Active Hospital Problems    Diagnosis    Suicidal ideation     Level of Participation:  active  Quality of Participation: cooperative and engaged  Attendance: Stayed for duration  Interactions with others: appropriate  Mood/Affect: appropriate and positive  Triggers (if applicable): None noted  Cognition: coherent/clear and logical  Progress: Moderate  Response: Pt engaged appropriately within group.  Plan: continue with current plan    Yvetta Coder. Honaker, CTRS      Electronically signed by Conni Elliot at 06/22/2022 11:18 AM EST

## 2022-06-22 NOTE — Care Plan (Signed)
Formatting of this note might be different from the original.    Problem: Adult Inpatient Plan of Care  Goal: Plan of Care Review  Outcome: Progressing  Goal: Patient-Specific Goal (Individualized)  Description: By the 7th day:    Ameyah will report at least 6 hours of uninterrupted sleep each morning    Carlette will eat at least 75% of meals and have adequate fluid intake at least twice a day    Ruthanne will remain safe from self-harm and remain free of self-inflicted injury at all times.    Sofi will discuss signs and symptoms of SI at least once per shift 1:1 with staff.    Olive will verbalize what medication(s) have been prescribed and why they are important to adhere to taking medication at least once daily.    Amarii will verbalize at least 2 new coping skills he/she has learned to manage stressors at least once daily    Dolly will adhere to fall precautions to ensure safety and to be free from falls every shift.    Coree will have adequate pain management.    Outcome: Progressing  Goal: Absence of Hospital-Acquired Illness or Injury  Outcome: Progressing  Goal: Optimal Comfort and Wellbeing  Outcome: Progressing  Goal: Readiness for Transition of Care  Outcome: Progressing    Problem: General - Nursing  Goal: STG: By 7th day, Alee will report 6 hours of uninterrupted sleep per night on 2 consecutive nights in order to promote rest and a healthy sleep/wake cycle.   Outcome: Progressing  Goal: STG: By 7th day, Gabrial will eat 75% of 3 meals per day for 2 days in order to promote healthy nutritional status.   Outcome: Progressing  Goal: STG: By 7th day, Audreanna will verbalize understanding of education about medications two times to promote safety.   Outcome: Progressing  Goal: STG: By 7th day, Branna will remain free from falls and new injuries/illnesses daily to promote safety.   Outcome: Progressing  Goal: STG: By 7th day, Mayara will complete/participate with ADLs to promote healthy hygiene at least once per day for  2 consecutive days.  Outcome: Progressing  Goal: LTG: By discharge, Dynastie will acknowledge understanding of discharge instructions one time prior to discharge to promote safety after discharge.   Outcome: Progressing  Goal: LTG: By discharge, Shenee will acknowledge understanding of discharge medication regimen one time prior to discharge to promote medication safety and adherence.  Outcome: Progressing  Goal: LTG: By discharge, Yong will report to staff at least one time 2 benefits of sleeping at least 6 hours to promote the healthy sleep/wake cycle after discharge.  Outcome: Progressing  Goal: LTG: By discharge, Neeta will report to staff at least one time 2 benefits of healthy nutritional intake in order to promote a healthy lifestyle post discharge.   Outcome: Progressing    Problem: Pain - Nursing  Goal: STG: By 7th day, Shyane will report an acceptable pain level of 5 or less on 3 consecutive assessments to demonstrate effective pain control.  Outcome: Progressing  Goal: STG: By 7th day, Challis will identify & use at least one non-medicinal pain reducing option to meet acceptable pain level of 5 at least 1 time every 8 hrs.  Outcome: Progressing    Problem: Depression - Nursing  Goal: STG: By 7th day, Menaal will engage in therapeutic milieu for 1) or more hours per day for 2 consecutive days.  Outcome: Progressing  Goal: STG: By 7th day, Lidiya will decrease isolation  by increasing time out of their room for  2 hours between the times of 0800 - 2200.    Outcome: Progressing  Goal: STG: By 7th day, Janella will identify 2 triggers of depression with staff once every 12 hours for 2 consecutive days.   Outcome: Progressing    Problem: Suicide Risk/Self Harm - Nursing  Goal: STG: By discharge, Katanya will communicate any thoughts of suicide and/or self-harm at least once every shift during one to one with staff member.   Outcome: Progressing  Goal: STG: By discharge, Elonna will have no attempts or threats of self-harm  for 24 hours.  Outcome: Progressing  Goal: STG: By discharge, Tamina will report reduction in suicidal ideations on 2 consecutive shift assessments.  Outcome: Progressing  Goal: STG: By discharge, Sameria will deny suicidal ideations, plan, and intent on 2 consecutive shift assessments 24 hours.  Outcome: Progressing    Problem: Anxiety - Nursing  Goal: STG: By 7th day, Desmond will utilize no PRN medications for anxiety in one 24-hour period.   Outcome: Progressing  Goal: STG: By 7th day, Sheilyn will identify level of anxiety with staff at least once every 12 hours for 2 consecutive days.  Outcome: Progressing  Goal: LTG: By discharge, Amarea will require no PRN anxiety medication in the final 24 hours of the admission to promote safety and comfort after discharge.  Outcome: Progressing  Goal: LTG: By discharge, Tykeisha will implement one identified coping skill when feeling anxious as first option to relieve anxiety at least once.  Outcome: Progressing      Pt denies SI/HI or AVH. MMG compliant. Walks with steady gait. Concerns with her taking the proper dose of Warfarin that she usually takes at home. This nurse verified that dosage prescribed at the hospital coincides with outpatient dosage. Will continue to monitor.   Electronically signed by Zerita Boers, RN at 06/22/2022  9:45 AM EST

## 2022-06-22 NOTE — Progress Notes (Signed)
Formatting of this note might be different from the original.  2 different RNs attempted lab draw; both unsuccessful. IV team paged.   Electronically signed by Ezekiel Ina, RN at 06/22/2022  5:58 AM EST

## 2022-06-22 NOTE — Group Note (Signed)
Formatting of this note is different from the original.  Group Topic: BH - Daily Wrap Up  Group Date: 06/22/2022  Start Time: 1730  End Time: 1800  Facilitators: Sheliah Hatch  Department: [place]    Number of Participants: 10  Group Focus: check in and goals/reality orientation  Treatment Modality: Solution-Focused Therapy  Interventions utilized were support  Purpose: express feelings  Group Activity: Group engaged in sharing their feelings and their progress that they have made towards their goals that were set during morning goal group.    Name: Mary [name]  Date of Birth: 1966-01-21  MR: [id number]    Patient's Problems:   Active Hospital Problems    Diagnosis    Suicidal ideation     Level of Participation:  minimal  Quality of Participation: cooperative  Attendance: Stayed for duration  Interactions with others: appropriate  Mood/Affect: appropriate  Triggers (if applicable): None noted  Cognition: goal directed  Progress: Minimal  Response: Patient participated appropriately during group.  Plan: continue with current plan      Electronically signed by Philippa Sicks S at 06/22/2022  6:16 PM EST

## 2022-06-22 NOTE — Group Note (Signed)
Formatting of this note is different from the original.  Group Topic: BH - Healthy Communication  Group Date: 06/22/2022  Start Time: 1300  End Time: 1345  Facilitators: Alford Highland  Department: [place]    Number of Participants: 7  Group Focus: communication  Treatment Modality: Cognitive Behavioral Therapy and Skills Training  Interventions utilized were assignment and group exercise  Purpose: improve communication skills  Group Activity: Group engaged in examples of communication skills    Name: Mary [name]  Date of Birth: 1965/12/25  MR: [id number]    Patient's Problems:   [place] [facility] Problems    Diagnosis    Suicidal ideation     Level of Participation:  moderate  Quality of Participation: attentive, cooperative, and engaged  Attendance: Stayed for duration  Interactions with others: appropriate  Mood/Affect: appropriate  Triggers (if applicable): None noted  Cognition: insightful and logical  Progress: Moderate  Response: pt was attentive and cooperative during group discussion.  Plan: continue with current plan      Electronically signed by Carolan Clines at 06/22/2022  2:31 PM EST

## 2022-06-22 NOTE — Progress Notes (Signed)
Formatting of this note might be different from the original.  IV Team: labs obtained from right ac with u/s, labeled and handed off to bedside RN  Electronically signed by Willia Craze, RN at 06/22/2022  8:25 AM EST

## 2022-06-22 NOTE — Group Note (Signed)
Formatting of this note is different from the original.  Group Topic: BH - Healthy Communication  Group Date: 06/22/2022  Start Time: [place]  End Time: 1530  Facilitators: Conni Elliot  Department: [place]    Number of Participants: 7  Group Focus: art therapy, communication, coping skills, leisure skills, and social skills  Treatment Modality: Leisure Development  Interventions utilized were leisure development  Purpose: enhance coping skills and improve communication skills  Group Activity: Group engaged in a game of pictionary where each participants chose a card and then picked one of the ten options on that card to illustrate on the white board. The artist was instructed to not speak and only illustrate through her drawing while peers guessed the word. Participants discussed how individuals visualize things differently and it is important to understand this when communicating ourselves and listening to others.    Name: Mary [name]  Date of Birth: 21-Jan-1966  MR: [id number]    Patient's Problems:   Active Hospital Problems    Diagnosis    Suicidal ideation     Level of Participation:  active  Quality of Participation: cooperative and engaged  Attendance: Stayed for duration  Interactions with others: appropriate  Mood/Affect: appropriate and positive  Triggers (if applicable): None noted  Cognition: coherent/clear and logical  Progress: Moderate  Response: Pt engaged appropriately within group.  Plan: continue with current plan    Yvetta Coder. Honaker, CTRS      Electronically signed by Conni Elliot at 06/22/2022  3:13 PM EST

## 2022-06-22 NOTE — Progress Notes (Signed)
Formatting of this note is different from the original.  General Progress Note - Service Date: 06/22/2022   [facility] - Admission Date: 06/20/2022     Assessment & Plan     Suicide ideation  Osteoporosis  GERD  Chronic pain  Essential HTN  Chronic diastolic heart failure  History of mitral valve replacement with bioprosthetic valve, anticoagulated with coumadin  HLD  DM2  COPD  Lumbar compression fracture  Tobacco abuse    PLAN: Chart reviewed. No current complaints. Continue asa 81 mg daily, farxiga 10 mg daily, lasix 40 mg daily, zocor 20 mg daily. Daily Coumadin dosing per pharmacy; goal INR 2-3. Outpatient [facility] visit notes reviewed - no clear evidence of presence of other risk factors - suspect INR goal to be 2.0 to 3.0 - discussed extensively with IP pharmacist.     No acute medical issues. Will sign off. Thank you for the consult.     Problem List:   Active Hospital Problems    Diagnosis    Suicidal ideation [R45.851]     Subjective     Seen and examined in [place]. Ambulating with a walker. No chest pain, no shortness of breath, no nausea or vomiting. Asking for Coumadin. Denies SI/HI. All other ROS neg.     Review of Systems   Constitutional:  Positive for activity change.     Objective     Vital Signs:  BP: 134/69  Heart Rate: 75 (06/21/22 2100)  Pulse: 65 (06/22/22 0827)  Temp: 97 F (36.1 C)  Resp: 16  Height: 5\' 3"  (160 cm)  Weight: 70.8 kg (156 lb)  BMI (Calculated): 27.64  SpO2: 97 %     Temp (24hrs), Avg:97 F (36.1 C), Min:97 F (36.1 C), Max:97 F (36.1 C)      No intake or output data in the 24 hours ending 06/22/22 1102    Physical Exam  Vitals reviewed.   HENT:      Head: Normocephalic and atraumatic.      Right Ear: Tympanic membrane normal.      Left Ear: Tympanic membrane normal.      Mouth/Throat:      Mouth: Mucous membranes are dry.   Eyes:      Extraocular Movements: Extraocular movements intact.      Pupils: Pupils are equal, round, and reactive to light.    Cardiovascular:      Rate and Rhythm: Normal rate and regular rhythm.      Pulses: Normal pulses.      Heart sounds: Normal heart sounds.   Pulmonary:      Breath sounds: Normal breath sounds.   Abdominal:      General: Bowel sounds are normal.      Palpations: Abdomen is soft. There is no mass.   Musculoskeletal:      Cervical back: Neck supple.   Skin:     General: Skin is dry.      Coloration: Skin is pale.   Neurological:      Mental Status: She is alert.         I have reviewed the following pertinent result(s).  All labs in past 24hrs:   Results for orders placed or performed during the hospital encounter of 06/20/22 (from the past 24 hour(s))   Pregnancy Urine now   Result Value Ref Range    PREGNANCY URINE Negative Negative   PT-INR   Result Value Ref Range    Protime 12.6 9.0 -  13.0 sec    INR 1.13 0.89 - 1.29     Medical Decision Making     MDM:  I independently reviewed all laboratory data, imaging data and notes pertaining to current hospitalization. I discussed with the physicians and the nurses involved in this patient's care about patient's current medical problems and proposed management plan.  Also discussed care plan with patient and family.  Also answered multiple questions from the ancillary staff regarding patient's care plan through out the day.  Cumulative time spent on care is 51 minutes. This includes time involved in patient chart review including previous hospitalizations and results,face-to-face encounter with patient, communications with patient, family and care team.     Checklist     Code Status: FULL       Pharmacologic VTE Prophylaxis (From admission, onward)      Ordered     Ordering Provider      Tue Jun 21, 2022 11:16 AM    06/21/22 1116  warfarin (Coumadin) tablet 2.5 mg  EVERY EVENING AT 1800         Alonna Buckler, MD      Tue Jun 21, 2022  8:37 AM    06/21/22 0837  warfarin (COUMADIN) - Pharmacist to evaluate daily  EVERY EVENING AT 1800        See Hyperspace for full Linked  Orders Report.    Alonna Buckler, MD           Patient Lines/Drains/Airways Status       Active Peripheral Venous Line / Central Venous Line / Arterial Line / Left Atrial Line / Epidural Line / Airway / Subcutaneous Line / Drain / PIV Line / Intraosseous Line       Name Placement date Placement time Site Days Last dressing change    PIV: 02/07/22 0945 20 gauge Forearm Anterior;Proximal;Right 02/07/22  0945  Forearm  135                IP ANTIINFECTIVES (From admission, onward)      None             Arnetha Courser, MD  06/22/2022, 11:02 AM    Electronically signed by Arnetha Courser, MD at 06/22/2022 12:10 PM EST

## 2022-06-22 NOTE — Progress Notes (Signed)
Formatting of this note is different from the original.  Images from the original note were not included.      Inpatient Psychiatry Progress Note    Patient's Name: Mary [name]  Patient's DOB/Sex:  Oct 03, 1965 / female  Medical Record #: [id number]  Date of Admission: 06/20/2022  Date of Evaluation: 06/22/2022  Estimated date of Discharge: Friday to [facility]  Code Status: Full  Legal Status: Voluntary      Psychiatric Assessment:  Suicidal ideation  Mood disorder              -Major depressive disorder, recurrent, severe, without psychotic features versus bipolar disorder, current episode depressed  PTSD  Alcohol use disorder, mild-relapsed 5 days ago  Cannabis use  Cocaine use tobacco use  Tobacco use  Insomnia   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  May repeat another 50 mg  Will continue to monitor trauma and underlying mood symptoms and assess the need to add an antidepressant.  Cymbalta may be an option.  Has additional as needed comfort medications  Have discussed MAT, substance abuse program, self-help group, IOP/PHP, residential program  Nicotine patch 14 mg daily as needed.  To remove at bedtime  Start prazosin 1 mg nightly    Medical Management:  Appreciate hospitalist service and medical management.  Please refer to consultation note for further details  Labs reviewed.  CBC, renal within normal limit.  Last LFT normal 04/19/2022.  Lipid panel showed high LDL, non-HDL and total cholesterol.  Current admission serum ethanol was negative, COVID-negative.  UDS was positive for cannabis cocaine and oxycodone.  Pregnancy test was not done.  Has been ordered    Per PMP: 06/14/2022 oxycodone 5 mg 3 times daily for 7 days, Lyrica 200 mg 3  times daily filled 06/09/2022, 05/10/2022 Ativan 0.5 mg 3 times daily    Patient is requesting for walker.  Will try to order that on admission.  Says she uses cane at home.  Added oxycodone 5 mg twice daily as needed.  This will not be prescribed on discharge    -- Discussed potential risks, benefits, and/or side effects of the above treatment. The patient was given the opportunity to ask questions.  After a thorough discussion, the patient verbalized an understanding of, and agreement with the above treatment plan.     Intervention:   Daily medication management by psychiatrist.   Daily individual psychotherapy by psychiatrist.   24/7 monitoring and care of patient by nursing staff.   Group psychoeducation 4 times a week   Social work and discharge planner with establish a thorough discharge plan.   Therapeutic activities per the unit schedule or group schedule 4 times a week   Medical management: H&P to be completed and treatment provided by Hospitalist.  Rationale for any seclusion or restraint: None     Social worker to assist in discharge planning  Complete suicide safety plan  Patient to call her group home at [facility]. in [place] and find out if she can return there.  She prefers not to  Screened by [facility] on 06/21/2022.  Has been accepted  Has a provider Ms. Delcie Roch, NP at [facility]  Needs to be linked with  a therapist.  Patient says her insurance company found her therapist who she has not linked with yet  Could benefit from PHP/IOP, day program  Explore her interest in substance abuse program further  Needs to follow-up with her PCP, specialist upon discharge  Medications will be filled at the hospital pharmacy prior to discharge    Treatment Goals:   Short term goals: Stabilization of and improvement in the above conditions. Specifically, by the end of the hospitalization,  she will need to stabilize psychiatrically so that there will be improvement mood, sleep, lethal ideation, able tolerate  medication changes.  Patient to engage in groups and participate in discharge planning.  We will explore additional resources, PHP/IOP, substance abuse program, and connected with additional services prior to discharge.  Patient to complete suicide safety plan.    Long term goals: Establish or follow-up with established outpatient psychiatrist and maintain improvement in the above conditions. Specifically, long-term medication management, psychotherapy, trauma focused therapy, utilize community resources and support so she is able to maintain safety, stability, and function at a full capacity.    Level of Risk:   The level of risk is high, secondary to suicidal ideation with a plan    Chief Complaint and Reasons for Admission:   Suicidal ideation, emotional distress  " I grabbed a razor."    Chief Complaint: " I am feeling better today."    Interval History:   Patient's care was discussed during treatment team meeting. Staff reported that patient feels she has not felt good.  Reports back pain.  Pleasant.  INR 1.35.  Second shift slept during the day.  Some chronic pain.  Depression and anxiety.  Difficulty getting to sleep.  Showered, attended 3 groups.  Blunted affect.  Slept about 4 hours.  Did take trazodone.    She was screened by [facility] and has a bed for Friday.  Patient appreciated and is excited about that.  Says now her mood is better.  There are still some depression and anxiety.  She was having some flashbacks.  Says last night she had a lot of nightmares that kept her up.  Denies psychotic symptoms.  Suicidal ideation more manageable today and feels safe on the unit.  She still worried what is next for her and is easily overwhelmed.  Seems to be tolerating medications.  She likes the Trileptal and wants to continue that.  Feels that it is keeping her emotions, and stable.  We discussed about medications for nightmares and she is agreeable to prazosin.  Will have to monitor blood pressure as the  diastolic seems to be running on the lower side    Individualized Psychotherapy and Psychoeducation:   I spent more than 16 minutes of insight oriented and supportive psychotherapy. We specifically discussed boundaries.  Discussed patient's behavior when she was in the emergency room.  Patient understands that she needs to work on respecting others.  Given example today where she was able to say something but then kept herself quiet and feels proud about that.  We discussed ways she can regulate her emotions better.    Vital Signs:   Patient Vitals for the past 24 hrs:   Temp Heart Rate Pulse Resp BP BP Mean SpO2   06/22/22 0827 -- -- 65 -- 134/69 95 MM HG 97 %   06/21/22 2100 97 F (36.1 C) 75 75 16 117/69 85 MM HG 93 %     Psychiatric Examination:  Mental Status Exam:  Orientation: Oriented to: Person, Place, Time, and Situation  Appearance: Unkempt, missing most of her teeth  Activity: Normal  Attitude: cooperative  Speech: Fluent and Spontaneous with adequate pace, and volume.   Mood: Depressed and anxious "Little better"  Affect: Mood congruent and Reactive.  Brighter  Thought Processes: Well formed  Thought Content: No homicidal thoughts and No delusions are evident.  Reports passive suicidal thoughts but no plan or intent.  She feels safe on the unit  Perception/Associations: Denies having any auditory or visual hallucinations  Attention and Concentration: Intact  Insight and Judgement: Fair and adequate for safety at current level of care.  Language: Able to find words appropriately  Impulse Control: At baseline  Estimated Intelligence: Average  Fund of Knowledge: Good  Estimated level of functioning (GAF): 40  Memory: Intact  Sleep: Fragmented sleep  Gait and Station: Slow gait and using a walker on the unit  Muscle Strength and Tone: No involuntary movements    Past Medical and Surgical History:   Past Medical History:   Diagnosis Date    Back pain     Cardiac arrhythmia     COPD (chronic obstructive  pulmonary disease) (HCC)      Past Surgical History:   Procedure Laterality Date    HYSTERECTOMY       Medications:  aspirin EC, 81 mg, Daily  dapagliflozin propanediol, 10 mg, Daily  docusate sodium, 100 mg, Daily  furosemide, 40 mg, Daily  lurasidone, 40 mg, QDay with Dinner  metoprolol XL, 25 mg, Daily  omeprazole, 20 mg, QAM AC  OXcarbazepine, 150 mg, BID  Pregabalin, 200 mg, TID  simvastatin, 20 mg, QHS  therapeutic multivitamin, 1 Tab, Daily  vitamin E, 400 Units, Daily  warfarin - reminder, obtain daily order, 1 Each, Q PM 1800  warfarin, 2.5 mg, Q PM 1800    acetaminophen, 650 mg, Q4H PRN  albuterol, 2 Puff, Q6H PRN  Alum-Mag Hydroxide-Simeth DS, 15 mL, Q4H PRN  benztropine, 2 mg, Q2H PRN   Or  benztropine, 2 mg, Q2H PRN  hydrOXYzine, 25 mg, Q4H PRN  lidocaine, 1 Patch, Q12H PRN  LORazepam, 2 mg, Q4H PRN  LORazepam, 1 mg, Q4H PRN  magnesium hydroxide, 30 mL, Daily PRN  nicotine, 1 Patch, Daily PRN  OLANZapine, 5 mg, Q6H PRN   Or  OLANZapine, 5 mg, Q6H PRN  oxyCODONE-acetaminophen, 1 Tab, BID PRN  traZODone, 50 mg, HS PRN-May repeat x1    Allergies:  Allergies   Allergen Reactions    Adhesive Tape-Silicones rash/itching     Certification:   I certify that "the patient continues to need, on a daily basis, active treatment furnished directly by or requiring the supervision of inpatient psychiatric facility personnel".    I certify that inpatient services furnished since the previous certification or recertification were, and continued to be medically necessary for treatment which could reasonably be expected to improve the patient's condition.    Data Review:  1-1   Results for orders placed or performed during the hospital encounter of 06/20/22 (from the past 24 hour(s))   PT-INR   Result Value Ref Range    Protime 14.8 (H) 9.0 - 13.0 sec    INR 1.35 (H) 0.89 - 1.29   Pregnancy Urine now   Result Value Ref Range    PREGNANCY URINE Negative Negative   PT-INR   Result Value Ref Range    Protime 12.6 9.0 - 13.0  sec    INR 1.13 0.89 -  1.29     2-1.   Results for orders placed or performed during the hospital encounter of 06/20/22   EKG 12 LEAD UNIT PERFORMED   Result Value    Heart Rate 64    RR Interval 932    Atrial Rate 70    P-R Interval 151    P Duration 119    P Horizontal Axis -39    P Front Axis 93    Q Onset 500    QRSD Interval 98    QT Interval 447    QTcB 463    QTcF 457    QRS Horizontal Axis -19    QRS Axis 20    I-40 Front Axis 32    t-40 Horizontal Axis -20    T-40 Front Axis 30    T Horizontal Axis -20    T Wave Axis 47    S-T Horizontal Axis 185    S-T Front Axis 247    Impression - ABNORMAL ECG -    Impression      RVAR-Unknown rhythm, irregular rate-V-rate 50-81, variation>10%    Impression      LVHVP-Probable left ventricular hypertrophy-multiple LVH criteria    Impression      T3AN-Abnormal T, consider ischemia, anterior leads-T <-0.16mV, V2-V4    Impression NOECG-No ECG for comparison-   Results for orders placed or performed during the hospital encounter of 02/07/22   EKG 12-LEAD   Result Value    Heart Rate 60    RR Interval 1,000    Atrial Rate 59    P-R Interval 149    P Duration 118    P Horizontal Axis -13    P Front Axis 84    Q Onset 500    QRSD Interval 96    QT Interval 455    QTcB 455    QTcF 455    QRS Horizontal Axis -15    QRS Axis 54    I-40 Front Axis 64    t-40 Horizontal Axis -19    T-40 Front Axis 45    T Horizontal Axis -16    T Wave Axis 60    S-T Horizontal Axis 176    S-T Front Axis -84    Impression - ABNORMAL ECG -    Impression SR-Sinus rhythm-normal P axis, V-rate 50-99    Impression      PLAE-Probable left atrial enlargement-P >57mS, <-0.62mV V1    Impression      LVH1-Left ventricular hypertrophy-multiple LVH criteria    Impression      T3AN-Abnormal T, consider ischemia, anterior leads-T <-0.21mV, V2-V4    Impression -similar to prior-     3-1.   Recent Results (from the past 336 hour(s))   1. CHEST PORTABLE - Chest Pain    Narrative    EXAM: CHEST RADIOGRAPH    CLINICAL  INDICATION/HISTORY: Provided with order -   CHEST PAIN    > Additional: None    COMPARISON: None    TECHNIQUE: Portable frontal view of the chest    _______________    FINDINGS:    SUPPORT DEVICES: None.    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 Midline enlarged cardiac silhouette. Unremarkable mediastinal hilar contours    LUNGS AND PLEURAL SPACES: Bilateral diffuse coarse interstitial markings with minimal peripheral left midlung asymmetric linear opacities. No consolidation, mass or effusion.    BONY THORAX AND SOFT TISSUES: Unremarkable.    _______________  Impression    Cardiomegaly with mild nonspecific diffuse interstitial prominence and left midlung atelectasis. Diagnostic considerations would include nonspecific bronchitis/bronchiolitis, or potentially mild edema.    Signed By: Epimenio Foot, MD on 06/20/2022 12:26 PM    2. EKG 12 LEAD UNIT PERFORMED   Result Value Ref Range    Heart Rate 64 bpm    RR Interval 932 ms    Atrial Rate 70 ms    P-R Interval 151 ms    P Duration 119 ms    P Horizontal Axis -39 deg    P Front Axis 93 deg    Q Onset 500 ms    QRSD Interval 98 ms    QT Interval 447 ms    QTcB 463 ms    QTcF 457 ms    QRS Horizontal Axis -19 deg    QRS Axis 20 deg    I-40 Front Axis 32 deg    t-40 Horizontal Axis -20 deg    T-40 Front Axis 30 deg    T Horizontal Axis -20 deg    T Wave Axis 47 deg    S-T Horizontal Axis 185 deg    S-T Front Axis 247 deg    Impression - ABNORMAL ECG -     Impression       RVAR-Unknown rhythm, irregular rate-V-rate 50-81, variation>10%    Impression       LVHVP-Probable left ventricular hypertrophy-multiple LVH criteria    Impression       T3AN-Abnormal T, consider ischemia, anterior leads-T <-0.50mV, V2-V4    Impression NOECG-No ECG for comparison-      Gerlene Burdock, MD    Psychiatrist     Note: this chart I was prepared using voice-recognition software and may contain unintended word substitution  errors.      Electronically signed by Gerlene Burdock, MD at 06/22/2022 12:44 PM EST

## 2022-06-23 NOTE — Group Note (Signed)
Formatting of this note is different from the original.  Group Topic: BH - Cognitive Skills  Group Date: 06/23/2022  Start Time: [place]  End Time: 1530  Facilitators: Conni Elliot  Department: [place]    Number of Participants: 7  Group Focus: communication, concentration, coping skills, leisure skills, and social skills  Treatment Modality: Leisure Development  Interventions utilized were leisure development and mental fitness  Purpose: enhance coping skills and increase insight  Group Activity: Group engaged in Tapple, a game that involves naming items in a specific category within a certain amount of time. Each participants plays individually and takes turns naming something with a letter of the alphabet and once that letter has been used no other players can use it. Participants get eliminated when they cannot name an item with the available letters or they name an item that another player has already used. Participants are encouraged to think quickly on their feet and to practice staying calm under pressure.    Name: Mary [name]  Date of Birth: 1965-08-09  MR: [id number]    Patient's Problems:   Active Hospital Problems    Diagnosis    Suicidal ideation     Level of Participation:  active  Quality of Participation: cooperative and engaged  Attendance: Stayed for duration  Interactions with others: appropriate  Mood/Affect: appropriate and positive  Triggers (if applicable): None noted  Cognition: coherent/clear and logical  Progress: Moderate  Response: Pt engaged appropriately within group.  Plan: continue with current plan    Yvetta Coder. Honaker, CTRS      Electronically signed by Conni Elliot at 06/23/2022  3:08 PM EST

## 2022-06-23 NOTE — Group Note (Signed)
Formatting of this note is different from the original.  Group Topic: BH - Coping Skills  Group Date: 06/23/2022  Start Time: 1300  End Time: 1340  Facilitators: Ok Edwards; Liliane Shi, Museum/gallery conservator  Department: [place]    Number of Participants: 9  Group Focus: coping skills  Treatment Modality: Cognitive Behavioral Therapy, Patient-Centered Therapy, and Psychoeducation  Interventions utilized were group exercise, patient education, and support  Purpose: enhance coping skills and increase insight  Group Activity: Group engaged in a discussion and activity involving the topic of coping skills.    Name: Mary [name]  Date of Birth: [birth year]  MR: [id number]    Patient's Problems:   Active Hospital Problems    Diagnosis    Suicidal ideation     Level of Participation:  moderate  Quality of Participation: attentive, cooperative, engaged, and offered feedback  Attendance: Stayed for duration  Interactions with others: appropriate  Mood/Affect: appropriate and brightens with interaction  Triggers (if applicable): None noted  Cognition: coherent/clear and insightful  Progress: Moderate  Response: Patient was attentive, engaged and offered feedback during the group discussion.  Plan: continue with current plan      Electronically signed by Carolan Clines at 06/23/2022  3:09 PM EST

## 2022-06-23 NOTE — Care Plan (Signed)
Formatting of this note might be different from the original.    Problem: Adult Inpatient Plan of Care  Goal: Plan of Care Review  Outcome: Progressing  Goal: Patient-Specific Goal (Individualized)  Description: By the 7th day:    Mary [name] will report at least 6 hours of uninterrupted sleep each morning    Mary [name] will eat at least 75% of meals and have adequate fluid intake at least twice a day    Mary [name] will remain safe from self-harm and remain free of self-inflicted injury at all times.    Mary [name] will discuss signs and symptoms of SI at least once per shift 1:1 with staff.    Mary [name] will verbalize what medication(s) have been prescribed and why they are important to adhere to taking medication at least once daily.    Mary [name] will verbalize at least 2 new coping skills he/she has learned to manage stressors at least once daily    Mary [name] will adhere to fall precautions to ensure safety and to be free from falls every shift.    Mary [name] will have adequate pain management.    Outcome: Progressing  Goal: Absence of Hospital-Acquired Illness or Injury  Outcome: Progressing  Goal: Optimal Comfort and Wellbeing  Outcome: Progressing  Goal: Readiness for Transition of Care  Outcome: Progressing    Problem: General - Nursing  Goal: STG: By 7th day, Mary [name] will report 6 hours of uninterrupted sleep per night on 2 consecutive nights in order to promote rest and a healthy sleep/wake cycle.   Outcome: Progressing  Goal: STG: By 7th day, Mary [name] will eat 75% of 3 meals per day for 2 days in order to promote healthy nutritional status.   Outcome: Progressing  Goal: STG: By 7th day, Mary [name] will verbalize understanding of education about medications two times to promote safety.   Outcome: Progressing  Goal: STG: By 7th day, Mary [name] will remain free from falls and new injuries/illnesses daily to promote safety.   Outcome: Progressing  Goal: STG: By 7th day, Mary [name] will complete/participate with ADLs to promote healthy hygiene at least once per day for  2 consecutive days.  Outcome: Progressing  Goal: LTG: By discharge, Mary [name] will acknowledge understanding of discharge instructions one time prior to discharge to promote safety after discharge.   Outcome: Progressing  Goal: LTG: By discharge, Mary [name] will acknowledge understanding of discharge medication regimen one time prior to discharge to promote medication safety and adherence.  Outcome: Progressing  Goal: LTG: By discharge, Mary [name] will report to staff at least one time 2 benefits of sleeping at least 6 hours to promote the healthy sleep/wake cycle after discharge.  Outcome: Progressing  Goal: LTG: By discharge, Mary [name] will report to staff at least one time 2 benefits of healthy nutritional intake in order to promote a healthy lifestyle post discharge.   Outcome: Progressing    Problem: Pain - Nursing  Goal: STG: By 7th day, Mary [name] will report an acceptable pain level of 5 or less on 3 consecutive assessments to demonstrate effective pain control.  Outcome: Progressing  Goal: STG: By 7th day, Mary [name] will identify & use at least one non-medicinal pain reducing option to meet acceptable pain level of 5 at least 1 time every 8 hrs.  Outcome: Progressing    Problem: Depression - Nursing  Goal: STG: By 7th day, Mary [name] will engage in therapeutic milieu for 1) or more hours per day for 2 consecutive days.  Outcome: Progressing  Goal: STG: By 7th day, Mary [name] will decrease isolation  by increasing time out of their room for  2 hours between the times of 0800 - 2200.    Outcome: Progressing  Goal: STG: By 7th day, Mary [name] will identify 2 triggers of depression with staff once every 12 hours for 2 consecutive days.   Outcome: Progressing    Problem: Suicide Risk/Self Harm - Nursing  Goal: STG: By discharge, Mary [name] will communicate any thoughts of suicide and/or self-harm at least once every shift during one to one with staff member.   Outcome: Progressing  Goal: STG: By discharge, Mary [name] will have no attempts or threats of self-harm  for 24 hours.  Outcome: Progressing  Goal: STG: By discharge, Mary [name] will report reduction in suicidal ideations on 2 consecutive shift assessments.  Outcome: Progressing  Goal: STG: By discharge, Mary [name] will deny suicidal ideations, plan, and intent on 2 consecutive shift assessments 24 hours.  Outcome: Progressing    Problem: Anxiety - Nursing  Goal: STG: By 7th day, Mary [name] will utilize no PRN medications for anxiety in one 24-hour period.   Outcome: Progressing  Goal: STG: By 7th day, Mary [name] will identify level of anxiety with staff at least once every 12 hours for 2 consecutive days.  Outcome: Progressing  Goal: LTG: By discharge, Mary [name] will require no PRN anxiety medication in the final 24 hours of the admission to promote safety and comfort after discharge.  Outcome: Progressing  Goal: LTG: By discharge, Mary [name] will implement one identified coping skill when feeling anxious as first option to relieve anxiety at least once.  Outcome: Progressing    Pt denies SI/HI or AVH. Pt endorses depression and anxiety. Pt has begun walking without walker and is maintaining steady gait. MMG compliant. Will continue to monitor.   Electronically signed by Zerita Boers, RN at 06/23/2022 11:23 AM EST

## 2022-06-23 NOTE — Group Note (Signed)
Formatting of this note is different from the original.  Group Topic: BH - Healthy Leisure Skills  Group Date: 06/23/2022  Start Time: 1030  End Time: 1130  Facilitators: Conni Elliot  Department: [facility]    Number of Participants: 11  Group Focus: concentration, coping skills, leisure skills, music therapy, and social skills  Treatment Modality: Leisure Development  Interventions utilized were assignment, leisure development, and mental fitness  Purpose: enhance coping skills and increase insight and listening skills and deductive reasoning  Group Activity: Group engaged in music bingo where participants were given bingo sheets that were 5x5 and instructed to listen to a song being played and cross it off their bingo card if it is on their sheet. Each song played had the title of the song within the lyrics of the song. Participants were encouraged to listen for the title in the song if they were unfamiliar with the tune. The song bank had 31 songs so participants were also instructed that a song playing may or may not be on their specific sheet. Participants were told that the first individual to cross off every song on their sheet will win a prize.    Name: Ryenn Howeth  Date of Birth: [birth year]  MR: [id number]    Patient's Problems:   [facility] Problems    Diagnosis    Suicidal ideation     Level of Participation:  active  Quality of Participation: cooperative and engaged  Attendance: Stayed for duration  Interactions with others: appropriate  Mood/Affect: appropriate and positive  Triggers (if applicable): None noted  Cognition: coherent/clear and logical  Progress: Moderate  Response: Pt engaged appropriately within group.  Plan: continue with current plan    Yvetta Coder. Honaker, CTRS      Electronically signed by Conni Elliot at 06/23/2022 11:17 AM EST

## 2022-06-23 NOTE — Care Plan (Signed)
Formatting of this note might be different from the original.    Problem: Adult Inpatient Plan of Care  Goal: Plan of Care Review  Outcome: Progressing  Goal: Patient-Specific Goal (Individualized)  Description: By the 7th day:    Mary [name] will report at least 6 hours of uninterrupted sleep each morning    Mary [name] will eat at least 75% of meals and have adequate fluid intake at least twice a day    Mary [name] will remain safe from self-harm and remain free of self-inflicted injury at all times.    Mary [name] will discuss signs and symptoms of SI at least once per shift 1:1 with staff.    Mary [name] will verbalize what medication(s) have been prescribed and why they are important to adhere to taking medication at least once daily.    Mary [name] will verbalize at least 2 new coping skills he/she has learned to manage stressors at least once daily    Mary [name] will adhere to fall precautions to ensure safety and to be free from falls every shift.    Mary [name] will have adequate pain management.    Outcome: Progressing  Goal: Absence of Hospital-Acquired Illness or Injury  Outcome: Progressing  Goal: Optimal Comfort and Wellbeing  Outcome: Progressing  Goal: Readiness for Transition of Care  Outcome: Progressing    Problem: General - Nursing  Goal: STG: By 7th day, Mary [name] will report 6 hours of uninterrupted sleep per night on 2 consecutive nights in order to promote rest and a healthy sleep/wake cycle.   Outcome: Progressing  Goal: STG: By 7th day, Mary [name] will eat 75% of 3 meals per day for 2 days in order to promote healthy nutritional status.   Outcome: Progressing  Goal: STG: By 7th day, Mary [name] will verbalize understanding of education about medications two times to promote safety.   Outcome: Progressing  Goal: STG: By 7th day, Mary [name] will remain free from falls and new injuries/illnesses daily to promote safety.   Outcome: Progressing  Goal: STG: By 7th day, Mary [name] will complete/participate with ADLs to promote healthy hygiene at least once per day for  2 consecutive days.  Outcome: Progressing  Goal: LTG: By discharge, Mary [name] will acknowledge understanding of discharge instructions one time prior to discharge to promote safety after discharge.   Outcome: Progressing  Goal: LTG: By discharge, Mary [name] will acknowledge understanding of discharge medication regimen one time prior to discharge to promote medication safety and adherence.  Outcome: Progressing  Goal: LTG: By discharge, Mary [name] will report to staff at least one time 2 benefits of sleeping at least 6 hours to promote the healthy sleep/wake cycle after discharge.  Outcome: Progressing  Goal: LTG: By discharge, Mary [name] will report to staff at least one time 2 benefits of healthy nutritional intake in order to promote a healthy lifestyle post discharge.   Outcome: Progressing    Problem: Pain - Nursing  Goal: STG: By 7th day, Mary [name] will report an acceptable pain level of 5 or less on 3 consecutive assessments to demonstrate effective pain control.  Outcome: Progressing  Goal: STG: By 7th day, Mary [name] will identify & use at least one non-medicinal pain reducing option to meet acceptable pain level of 5 at least 1 time every 8 hrs.  Outcome: Progressing    Problem: Depression - Nursing  Goal: STG: By 7th day, Mary [name] will engage in therapeutic milieu for 1) or more hours per day for 2 consecutive days.  Outcome: Progressing  Goal: STG: By 7th day, Mary [name] will decrease isolation  by increasing time out of their room for  2 hours between the times of 0800 - 2200.    Outcome: Progressing  Goal: STG: By 7th day, Mary [name] will identify 2 triggers of depression with staff once every 12 hours for 2 consecutive days.   Outcome: Progressing    Problem: Suicide Risk/Self Harm - Nursing  Goal: STG: By discharge, Mary [name] will communicate any thoughts of suicide and/or self-harm at least once every shift during one to one with staff member.   Outcome: Progressing  Goal: STG: By discharge, Mary [name] will have no attempts or threats of self-harm  for 24 hours.  Outcome: Progressing  Goal: STG: By discharge, Mary [name] will report reduction in suicidal ideations on 2 consecutive shift assessments.  Outcome: Progressing  Goal: STG: By discharge, Mary [name] will deny suicidal ideations, plan, and intent on 2 consecutive shift assessments 24 hours.  Outcome: Progressing    Problem: Anxiety - Nursing  Goal: STG: By 7th day, Mary [name] will utilize no PRN medications for anxiety in one 24-hour period.   Outcome: Progressing  Goal: STG: By 7th day, Mary [name] will identify level of anxiety with staff at least once every 12 hours for 2 consecutive days.  Outcome: Progressing  Goal: LTG: By discharge, Mary [name] will require no PRN anxiety medication in the final 24 hours of the admission to promote safety and comfort after discharge.  Outcome: Progressing  Goal: LTG: By discharge, Mary [name] will implement one identified coping skill when feeling anxious as first option to relieve anxiety at least once.  Outcome: Progressing    Pt pleasant and cooperative. Compliant with PO meds. Given PRN PO trazodone 50mg  for insomnia, PRN PO tylenol 650mg  for back and leg pain, and PRN PO lidocaine patch for lower back pain at 21:03.  Electronically signed by Kingsley Plan., LPN at 60/02/9322 55:73 PM EST

## 2022-06-23 NOTE — Care Plan (Signed)
Formatting of this note might be different from the original.    Problem: Adult Inpatient Plan of Care  Goal: Plan of Care Review  Outcome: Progressing  Goal: Patient-Specific Goal (Individualized)  Description: By the 7th day:    Adonai will report at least 6 hours of uninterrupted sleep each morning    Wylene will eat at least 75% of meals and have adequate fluid intake at least twice a day    Fianna will remain safe from self-harm and remain free of self-inflicted injury at all times.    Piya will discuss signs and symptoms of SI at least once per shift 1:1 with staff.    Zenola will verbalize what medication(s) have been prescribed and why they are important to adhere to taking medication at least once daily.    Rhilyn will verbalize at least 2 new coping skills he/she has learned to manage stressors at least once daily    Delia will adhere to fall precautions to ensure safety and to be free from falls every shift.    Morgaine will have adequate pain management.    Outcome: Progressing  Goal: Absence of Hospital-Acquired Illness or Injury  Outcome: Progressing  Goal: Optimal Comfort and Wellbeing  Outcome: Progressing  Goal: Readiness for Transition of Care  Outcome: Progressing    Problem: General - Nursing  Goal: STG: By 7th day, Carlinda will report 6 hours of uninterrupted sleep per night on 2 consecutive nights in order to promote rest and a healthy sleep/wake cycle.   Outcome: Progressing  Goal: STG: By 7th day, Wave will eat 75% of 3 meals per day for 2 days in order to promote healthy nutritional status.   Outcome: Progressing  Goal: STG: By 7th day, Ekam will verbalize understanding of education about medications two times to promote safety.   Outcome: Progressing  Goal: STG: By 7th day, Chloe will remain free from falls and new injuries/illnesses daily to promote safety.   Outcome: Progressing  Goal: STG: By 7th day, Kambry will complete/participate with ADLs to promote healthy hygiene at least once per day for  2 consecutive days.  Outcome: Progressing  Goal: LTG: By discharge, Priyanka will acknowledge understanding of discharge instructions one time prior to discharge to promote safety after discharge.   Outcome: Progressing  Goal: LTG: By discharge, Loanne will acknowledge understanding of discharge medication regimen one time prior to discharge to promote medication safety and adherence.  Outcome: Progressing  Goal: LTG: By discharge, Lilibeth will report to staff at least one time 2 benefits of sleeping at least 6 hours to promote the healthy sleep/wake cycle after discharge.  Outcome: Progressing  Goal: LTG: By discharge, Sholonda will report to staff at least one time 2 benefits of healthy nutritional intake in order to promote a healthy lifestyle post discharge.   Outcome: Progressing    Problem: Pain - Nursing  Goal: STG: By 7th day, Breckyn will report an acceptable pain level of 5 or less on 3 consecutive assessments to demonstrate effective pain control.  Outcome: Progressing  Goal: STG: By 7th day, Renlee will identify & use at least one non-medicinal pain reducing option to meet acceptable pain level of 5 at least 1 time every 8 hrs.  Outcome: Progressing    Problem: Depression - Nursing  Goal: STG: By 7th day, Kynadie will engage in therapeutic milieu for 1) or more hours per day for 2 consecutive days.  Outcome: Progressing  Goal: STG: By 7th day, Emonnie will decrease isolation  by increasing time out of their room for  2 hours between the times of 0800 - 2200.    Outcome: Progressing  Goal: STG: By 7th day, Shaylen will identify 2 triggers of depression with staff once every 12 hours for 2 consecutive days.   Outcome: Progressing    Problem: Suicide Risk/Self Harm - Nursing  Goal: STG: By discharge, Jessicaann will communicate any thoughts of suicide and/or self-harm at least once every shift during one to one with staff member.   Outcome: Progressing  Goal: STG: By discharge, Kashlynn will have no attempts or threats of self-harm  for 24 hours.  Outcome: Progressing  Goal: STG: By discharge, Breonia will report reduction in suicidal ideations on 2 consecutive shift assessments.  Outcome: Progressing  Goal: STG: By discharge, Lachele will deny suicidal ideations, plan, and intent on 2 consecutive shift assessments 24 hours.  Outcome: Progressing    Problem: Anxiety - Nursing  Goal: STG: By 7th day, Jia will utilize no PRN medications for anxiety in one 24-hour period.   Outcome: Progressing  Goal: STG: By 7th day, Journii will identify level of anxiety with staff at least once every 12 hours for 2 consecutive days.  Outcome: Progressing  Goal: LTG: By discharge, Vanshika will require no PRN anxiety medication in the final 24 hours of the admission to promote safety and comfort after discharge.  Outcome: Progressing  Goal: LTG: By discharge, Annais will implement one identified coping skill when feeling anxious as first option to relieve anxiety at least once.  Outcome: Progressing      Pt given PRN PO tylenol 650mg  for hip pain and PRN PO trazodone 50mg  for insomnia at 20:23 and 21:48 per [facility].  Electronically signed by Kingsley Plan., LPN at 53/61/4431 54:00 AM EST

## 2022-06-23 NOTE — Group Note (Signed)
Formatting of this note is different from the original.  Group Topic: BH - Daily Goal Setting  Group Date: 06/23/2022  Start Time: 0900  End Time: 1884  Facilitators: Ayesha Rumpf  Department: [place]    Number of Participants: 10  Group Focus: goals/reality orientation  Treatment Modality: Solution-Focused Therapy  Interventions utilized were group exercise  Purpose: express feelings  Group Activity: Group engaged in Shared Feelings    Name: Mary [name]  Date of Birth: 1966/02/28  MR: [id number]    Patient's Problems:   [facility] Problems    Diagnosis    Suicidal ideation     Level of Participation:  minimal  Quality of Participation: cooperative  Attendance: Stayed for duration  Interactions with others: minimal  Mood/Affect: appropriate  Triggers (if applicable): None noted  Cognition: goal directed  Progress: Minimal  Response: Fair  Plan: continue with current plan      Electronically signed by Ayesha Rumpf at 06/23/2022 10:35 AM EST

## 2022-06-23 NOTE — Progress Notes (Signed)
Formatting of this note is different from the original.  Images from the original note were not included.      Inpatient Psychiatry Progress Note    Patient's Name: Mary [name]  Patient's DOB/Sex:  August 11, 1965 / female  Medical Record #: [id number]  Date of Admission: 06/20/2022  Date of Evaluation: 06/23/2022  Estimated date of Discharge: Friday to [facility]  Code Status: Full  Legal Status: Voluntary      Psychiatric Assessment:  Suicidal ideation  Mood disorder              -Major depressive disorder, recurrent, severe, without psychotic features versus bipolar disorder, current episode depressed  PTSD  Alcohol use disorder, mild-relapsed 5 days ago  Cannabis use  Cocaine use tobacco use  Tobacco use  Insomnia   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  May repeat another 50 mg  Will continue to monitor trauma and underlying mood symptoms and assess the need to add an antidepressant.  Cymbalta may be an option.  Has additional as needed comfort medications  Have discussed MAT, substance abuse program, self-help group, IOP/PHP, residential program  Nicotine patch 14 mg daily as needed.  To remove at bedtime  Started on 2/8 prazosin 1 mg nightly    Medical Management:  Appreciate hospitalist service and medical management.  Please refer to consultation note for further details  Labs reviewed.  CBC, renal within normal limit.  Last LFT normal 04/19/2022.  Lipid panel showed high LDL, non-HDL and total cholesterol.  Current admission serum ethanol was negative, COVID-negative.  UDS was positive for cannabis cocaine and oxycodone.  Pregnancy test was not done.  Has been ordered    Per PMP: 06/14/2022 oxycodone 5 mg 3 times daily for 7 days, Lyrica 200  mg 3 times daily filled 06/09/2022, 05/10/2022 Ativan 0.5 mg 3 times daily    Patient is requesting for walker.  Will try to order that on admission.  Says she uses cane at home.  Added oxycodone 5 mg twice daily as needed.  This will not be prescribed on discharge  Per pharmacy her INR is subtherapeutic and recommending Lovenox.  We have requested consultation from hospitalist service again.    -- Discussed potential risks, benefits, and/or side effects of the above treatment. The patient was given the opportunity to ask questions.  After a thorough discussion, the patient verbalized an understanding of, and agreement with the above treatment plan.     Intervention:   Daily medication management by psychiatrist.   Daily individual psychotherapy by psychiatrist.   24/7 monitoring and care of patient by nursing staff.   Group psychoeducation 4 times a week   Social work and discharge planner with establish a thorough discharge plan.   Therapeutic activities per the unit schedule or group schedule 4 times a week   Medical management: H&P to be completed and treatment provided by Hospitalist.  Rationale for any seclusion or restraint: None     Social worker to assist in discharge planning  Complete suicide safety plan  Patient to call her group home at [address] in [place] and find out if she can return there.  She prefers not to  Screened by [facility]  on 06/21/2022.  Has been accepted  Has a provider Ms. Delcie Roch, NP at [facility] to be linked with a therapist.  Patient says her insurance company found her therapist who she has not linked with yet  Could benefit from PHP/IOP, day program  Explore her interest in substance abuse program further  Needs to follow-up with her PCP, specialist upon discharge  Medications will be filled at the hospital pharmacy prior to discharge    Treatment Goals:   Short term goals: Stabilization of and improvement in the above conditions. Specifically, by the end of the  hospitalization,  she will need to stabilize psychiatrically so that there will be improvement mood, sleep, lethal ideation, able tolerate medication changes.  Patient to engage in groups and participate in discharge planning.  We will explore additional resources, PHP/IOP, substance abuse program, and connected with additional services prior to discharge.  Patient to complete suicide safety plan.    Long term goals: Establish or follow-up with established outpatient psychiatrist and maintain improvement in the above conditions. Specifically, long-term medication management, psychotherapy, trauma focused therapy, utilize community resources and support so she is able to maintain safety, stability, and function at a full capacity.    Level of Risk:   The level of risk is high, secondary to suicidal ideation with a plan    Chief Complaint and Reasons for Admission:   Suicidal ideation, emotional distress  " I grabbed a razor."    Chief Complaint: " It is little bit anxiety about tomorrow."    Interval History:   Patient's care was discussed during treatment team meeting. Staff reported that patient pleasant calm and cooperative.  Reports back pain.  Wants to make sure she gets getting all the medication that she receives at home.  INR is 1.35.  Second shift denies lethal ideation psychosis.  Endorsing depression.  Hip and back pain.  She did get her Percocet, trazodone, lidocaine patch and another dose of Percocet.    Patient says she is doing okay on the twice daily dosing of oxycodone.  She is aware this will not be prescribed by me on discharge.  She has been provided with phone number for her orthopedic to get an appointment made to get refills on oxycodone.  She has all her other prescriptions including Lyrica at home.  Emotionally doing better.  Trileptal has been helpful.  We will start the prazosin tonight.  Reports having nightmares last night and sleep was off-and-on.  Some anxiety about what is next for her  but is more hopeful.  Depression is more manageable.  Denies any lethal ideation.  No psychosis.  Has been attending groups.  She is future oriented.    Individualized Psychotherapy and Psychoeducation:   I spent more than 16 minutes of insight oriented and supportive psychotherapy. We specifically discussed how she plans on maintaining stability and sobriety.  Patient is still not sure about substance abuse program.  We have discussed available treatment option.  She feels that going to a new place, changing environment, and linking with services would help work on "people, places, things.  She is making future goals.  We have discussed about safety plan.  She talks about wanting to keep her routine, staying physically active, keeping her appointments, and going to some program to stay busy    Vital Signs:   Patient Vitals for the past 24 hrs:   Temp Heart Rate Pulse Resp BP BP Mean SpO2   06/23/22 0845 -- --  70 -- 120/69 89 MM HG 95 %   06/22/22 2100 97.7 F (36.5 C) 68 68 18 125/68 87 MM HG 97 %     Psychiatric Examination:  Mental Status Exam:  Orientation: Oriented to: Person, Place, Time, and Situation  Appearance: Better groomed, missing most of her teeth, overweight  Activity: Normal  Attitude: cooperative  Speech: Fluent and Spontaneous with adequate pace, and volume.   Mood: "Feeling better"  Affect: Mood congruent and Reactive.  Brighter  Thought Processes: Well formed  Thought Content: No homicidal thoughts and No delusions are evident.  No suicidal thoughts.    Perception/Associations: Denies having any auditory or visual hallucinations  Attention and Concentration: Intact  Insight and Judgement: Fair and adequate for safety at current level of care.  Language: Able to find words appropriately  Impulse Control: At baseline  Estimated Intelligence: Average  Fund of Knowledge: Good  Estimated level of functioning (GAF): 40  Memory: Intact  Sleep: Fragmented sleep.  Gait and Station: Slow gait and using a  walker on the unit however most of the day today she was not even using the walker  Muscle Strength and Tone: No involuntary movements    Past Medical and Surgical History:   Past Medical History:   Diagnosis Date    Back pain     Cardiac arrhythmia     COPD (chronic obstructive pulmonary disease) (HCC)      Past Surgical History:   Procedure Laterality Date    HYSTERECTOMY       Medications:  aspirin EC, 81 mg, Daily  dapagliflozin propanediol, 10 mg, Daily  docusate sodium, 100 mg, Daily  furosemide, 40 mg, Daily  lurasidone, 40 mg, QDay with Dinner  metoprolol XL, 25 mg, Daily  omeprazole, 20 mg, QAM AC  OXcarbazepine, 150 mg, BID  prazosin, 1 mg, QHS  Pregabalin, 200 mg, TID  simvastatin, 20 mg, QHS  therapeutic multivitamin, 1 Tab, Daily  vitamin E, 400 Units, Daily  warfarin - reminder, obtain daily order, 1 Each, Q PM 1800    acetaminophen, 650 mg, Q4H PRN  albuterol, 2 Puff, Q6H PRN  Alum-Mag Hydroxide-Simeth DS, 15 mL, Q4H PRN  benztropine, 2 mg, Q2H PRN   Or  benztropine, 2 mg, Q2H PRN  hydrOXYzine, 25 mg, Q4H PRN  lidocaine, 1 Patch, Q12H PRN  LORazepam, 2 mg, Q4H PRN  LORazepam, 1 mg, Q4H PRN  magnesium hydroxide, 30 mL, Daily PRN  nicotine, 1 Patch, Daily PRN  OLANZapine, 5 mg, Q6H PRN   Or  OLANZapine, 5 mg, Q6H PRN  oxyCODONE-acetaminophen, 1 Tab, BID PRN  traZODone, 50 mg, HS PRN-May repeat x1    Allergies:  Allergies   Allergen Reactions    Adhesive Tape-Silicones rash/itching     Certification:   I certify that "the patient continues to need, on a daily basis, active treatment furnished directly by or requiring the supervision of inpatient psychiatric facility personnel".    I certify that inpatient services furnished since the previous certification or recertification were, and continued to be medically necessary for treatment which could reasonably be expected to improve the patient's condition.    Data Review:  1-1   Results for orders placed or performed during the hospital encounter of  06/20/22 (from the past 24 hour(s))   PT-INR   Result Value Ref Range    Protime 12.7 9.0 - 13.0 sec    INR 1.14 0.89 - 1.29     2-1.   Results for orders  placed or performed during the hospital encounter of 06/20/22   EKG 12 LEAD UNIT PERFORMED   Result Value    Heart Rate 64    RR Interval 932    Atrial Rate 70    P-R Interval 151    P Duration 119    P Horizontal Axis -39    P Front Axis 93    Q Onset 500    QRSD Interval 98    QT Interval 447    QTcB 463    QTcF 457    QRS Horizontal Axis -19    QRS Axis 20    I-40 Front Axis 32    t-40 Horizontal Axis -20    T-40 Front Axis 30    T Horizontal Axis -20    T Wave Axis 47    S-T Horizontal Axis 185    S-T Front Axis 247    Impression - ABNORMAL ECG -    Impression      RVAR-Unknown rhythm, irregular rate-V-rate 50-81, variation>10%    Impression      LVHVP-Probable left ventricular hypertrophy-multiple LVH criteria    Impression      T3AN-Abnormal T, consider ischemia, anterior leads-T <-0.16mV, V2-V4    Impression NOECG-No ECG for comparison-   Results for orders placed or performed during the hospital encounter of 02/07/22   EKG 12-LEAD   Result Value    Heart Rate 60    RR Interval 1,000    Atrial Rate 59    P-R Interval 149    P Duration 118    P Horizontal Axis -13    P Front Axis 84    Q Onset 500    QRSD Interval 96    QT Interval 455    QTcB 455    QTcF 455    QRS Horizontal Axis -15    QRS Axis 54    I-40 Front Axis 64    t-40 Horizontal Axis -19    T-40 Front Axis 45    T Horizontal Axis -16    T Wave Axis 60    S-T Horizontal Axis 176    S-T Front Axis -84    Impression - ABNORMAL ECG -    Impression SR-Sinus rhythm-normal P axis, V-rate 50-99    Impression      PLAE-Probable left atrial enlargement-P >72mS, <-0.85mV V1    Impression      LVH1-Left ventricular hypertrophy-multiple LVH criteria    Impression      T3AN-Abnormal T, consider ischemia, anterior leads-T <-0.80mV, V2-V4    Impression -similar to prior-     3-1.   Recent Results (from the past  336 hour(s))   1. CHEST PORTABLE - Chest Pain    Narrative    EXAM: CHEST RADIOGRAPH    CLINICAL INDICATION/HISTORY: Provided with order -   CHEST PAIN    > Additional: None    COMPARISON: None    TECHNIQUE: Portable frontal view of the chest    _______________    FINDINGS:    SUPPORT DEVICES: None.    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 Midline enlarged cardiac silhouette. Unremarkable mediastinal hilar contours    LUNGS AND PLEURAL SPACES: Bilateral diffuse coarse interstitial markings with minimal peripheral left midlung asymmetric linear opacities. No consolidation, mass or effusion.    BONY THORAX AND SOFT TISSUES: Unremarkable.    _______________     Impression    Cardiomegaly with mild  nonspecific diffuse interstitial prominence and left midlung atelectasis. Diagnostic considerations would include nonspecific bronchitis/bronchiolitis, or potentially mild edema.    Signed By: Epimenio Foot, MD on 06/20/2022 12:26 PM    2. EKG 12 LEAD UNIT PERFORMED   Result Value Ref Range    Heart Rate 64 bpm    RR Interval 932 ms    Atrial Rate 70 ms    P-R Interval 151 ms    P Duration 119 ms    P Horizontal Axis -39 deg    P Front Axis 93 deg    Q Onset 500 ms    QRSD Interval 98 ms    QT Interval 447 ms    QTcB 463 ms    QTcF 457 ms    QRS Horizontal Axis -19 deg    QRS Axis 20 deg    I-40 Front Axis 32 deg    t-40 Horizontal Axis -20 deg    T-40 Front Axis 30 deg    T Horizontal Axis -20 deg    T Wave Axis 47 deg    S-T Horizontal Axis 185 deg    S-T Front Axis 247 deg    Impression - ABNORMAL ECG -     Impression       RVAR-Unknown rhythm, irregular rate-V-rate 50-81, variation>10%    Impression       LVHVP-Probable left ventricular hypertrophy-multiple LVH criteria    Impression       T3AN-Abnormal T, consider ischemia, anterior leads-T <-0.68mV, V2-V4    Impression NOECG-No ECG for comparison-      Gerlene Burdock, MD    Psychiatrist     Note: this chart I  was prepared using voice-recognition software and may contain unintended word substitution errors.      Electronically signed by Gerlene Burdock, MD at 06/23/2022 12:57 PM EST

## 2022-06-23 NOTE — Group Note (Signed)
Formatting of this note is different from the original.  Group Topic: BH - Daily Wrap Up  Group Date: 06/23/2022  Start Time: 1730  End Time: 1759  Facilitators: Lahoma Rocker  Department: [facility]    Number of Participants: 7  Group Focus: goals/reality orientation  Treatment Modality: Solution-Focused Therapy  Interventions utilized were group exercise  Purpose: express feelings  Group Activity: Group engaged in shared feelings    Name: Mary [name]  Date of Birth: December 24, 1965  MR: [id number]    Patient's Problems:   Active Hospital Problems    Diagnosis    Suicidal ideation     Level of Participation:  minimal  Quality of Participation: cooperative and engaged  Attendance: Stayed for duration  Interactions with others: gave feedback  Mood/Affect: appropriate  Triggers (if applicable): None noted  Cognition: goal directed  Progress: Minimal  Response: good  Plan: continue with current plan      Electronically signed by Lahoma Rocker at 06/23/2022  5:51 PM EST

## 2022-06-24 NOTE — Group Note (Signed)
Formatting of this note might be different from the original.  Group Topic: BH - Daily Goal Setting  Group Date: 06/24/2022  Start Time: 0900  End Time: 4270  Facilitators: Ayesha Rumpf  Department: [facility]    Number of Participants: 8  Group Focus: goals/reality orientation  Treatment Modality: Solution-Focused Therapy  Interventions utilized were group exercise  Purpose: express feelings  Group Activity: Group engaged in Shared Feelings    Name: Mary [name]  Date of Birth: January 22, 1966  MR: [id number]    Patient's Problems:   There are no active hospital problems to display for this patient.    Level of Participation:  minimal  Quality of Participation: cooperative  Attendance: Stayed for duration  Interactions with others: minimal  Mood/Affect: appropriate  Triggers (if applicable): None noted  Cognition: goal directed  Progress: Minimal  Response: Good  Plan: continue with current plan      Electronically signed by Ayesha Rumpf at 06/24/2022 10:11 AM EST

## 2022-06-24 NOTE — Progress Notes (Signed)
Formatting of this note might be different from the original.  All belongings returned pt discharged  Electronically signed by Thurnell Lose, RN at 06/24/2022 12:00 PM EST
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
## 2022-06-24 NOTE — Group Note (Signed)
Formatting of this note might be different from the original.  Group Topic: BH - Healthy Leisure Skills  Group Date: 06/24/2022  Start Time: 1030  End Time: [place]  Facilitators: Maryruth Eve  Department: [facility]    Number of Participants: 9  Group Focus: communication, coping skills, feeling awareness/expression, self-awareness, self-esteem, and social skills  Treatment Modality: Leisure Development  Interventions utilized were leisure development  Purpose: enhance coping skills, express feelings, and improve communication skills  Group Activity: Group engaged in [place] to engage in [place] leisure skills     Name: Francis Yardley  Date of Birth: 10-Feb-1966  MR: [id number]    Patient's Problems:   There are no active hospital problems to display for this patient.    Level of Participation:  active  Quality of Participation: attentive, cooperative, and engaged  Attendance: Stayed for duration  Interactions with others: appropriate  Mood/Affect: appropriate  Triggers (if applicable): None noted  Cognition: logical  Progress: Moderate  Response: Patient engaged in the group appropriately   Plan: continue with current plan    Danielle L. Inocente Salles      Electronically signed by Feliz Beam L at 06/24/2022 11:14 AM EST

## 2022-06-27 ENCOUNTER — Telehealth

## 2022-06-27 LAB — PROTIME-INR: INR: 2.2

## 2022-06-27 NOTE — Patient Instructions (Signed)
LEFT VOICEMAIL [phone] FOR PATIENT REGARDING WARFARIN INSTRUCTIONS BELOW.     Ms. Summers Buendia is here today for anticoagulation monitoring for the diagnosis of atrial fibrillation.  Her INR goal is 2.5-3.5 and her current Coumadin dose is 2.5MG .     Today's findings include an INR of 2.2     Considering Ms. Lutes past history, todays findings, and per the coumadin policy/protocol, Ms. Potvin was instructed to take Coumadin as follows,  5MG  MON. THURS 2.5MG  A.O.D.  She was also instructed to come back in 1 weeks for an INR check.    A full discussion of the nature of anticoagulants has been carried out.  A full discussion of the need for frequent and regular monitoring, precise dosage adjustment and compliance was stressed.  Side effects of potential bleeding were discussed and Ms. Bourassa was instructed to call [phone] if there are any signs of abnormal bleeding.  Ms. Locascio was instructed to avoid any OTC items containing aspirin or ibuprofen and prior to starting any new OTC products to consult with her physician or pharmacist to ensure no drug interactions are present.  Ms. Utke was instructed to avoid any major changes in her general diet and to avoid alcohol consumption.  .      Ms. Makris verbalized her understanding of all instructions and will call the office with any questions, concerns, or signs of abnormal bleeding or blood clot.

## 2022-06-27 NOTE — Telephone Encounter (Signed)
Patient states she wanted let Dr. Rory Percy know that she has an appt to have cement put in her back on 07/01/2022 at 2:00pm, and is asking for a refill of Oxycodone 2 tabs a day to be sent to the [facility] on [address] in [place].    Patient can be reached at [phone].

## 2022-06-27 NOTE — Telephone Encounter (Signed)
Has baclofen and zanaflex rx. No opioids due to recent ED for SI w/ UDS + cocaine, THC, and opioids.  Rest, alternate ice and heat.  Has upcoming appointment this week for kyphoplasty.

## 2022-06-27 NOTE — Telephone Encounter (Signed)
Attempted to contact the pt regarding her request. She was not able to be reached. A message was left for the pt letting her know that Dr. Rory Percy will not be able to refill the percocet at this time. She was advised to use her muscle relaxers, alternate heat and ice, and rest. The number to the office was provided in case there are any questions or concerns.

## 2022-06-28 MED ORDER — METHOCARBAMOL 750 MG PO TABS
750 MG | ORAL_TABLET | Freq: Three times a day (TID) | ORAL | 0 refills | Status: DC | PRN
Start: 2022-06-28 — End: 2022-06-30

## 2022-06-28 NOTE — Telephone Encounter (Signed)
Dc baclofen. Rx for robaxin for pain. Cont Lyrica 244m TID. Has IR appt Friday.

## 2022-06-28 NOTE — Telephone Encounter (Signed)
I called and spoke to the pt. The pt was identified using 2 pt identifiers. She was asking if there can be reconsideration for the pain medication. I explained to the pt that unfortunately she will not be able to reorder that medication. The pt is asking if there is anything else that she can take to help with the pain. She states that she is taking the baclofen and it is not doing much. The pt is also reporting a worsening pain that is going down her left leg. She was wanting to know if she needs to have an x-ray or scan ordered. I explained to the pt that she will need to make a follow up appt to be evaluated for this. The pt is currently scheduled for her eval with Dr. Cleon Dew on 07/01/22. She does not know when she is getting her injection.

## 2022-06-28 NOTE — Telephone Encounter (Signed)
Patient called in stating she needs to speak with Dr. Rory Percy or a nurse as soon as possible.     She then says I spoke with a representative yesterday and they gave me the message but I need to speak with Dr. Rory Percy or a nurse today.     Patient can be reached at [phone].

## 2022-06-29 NOTE — Telephone Encounter (Signed)
Patient says [facility] "screwed up" our prescription and doesn't have the methocarbamol (ROBAXIN-750) 750 MG tablet we sent them yesterday.  She's asking if we can send a new prescription for the same medication to [facility] on [address] instead.  Patient requests we let her know once done, [phone].

## 2022-06-29 NOTE — Telephone Encounter (Signed)
I called and spoke to [name]. The pt was identified using 2 pt identifiers. She was instructed to stop taking the baclofen. I made her aware of the new muscle relaxer that was sent over to the pharmacy. It was sent to the [facility] on [address] in [place]. The pt states that she is at a temporary group home in [place] but will have the prescription transferred to a closer location. No questions or concerns voiced at this time.

## 2022-06-30 MED ORDER — METHOCARBAMOL 750 MG PO TABS
750 MG | ORAL_TABLET | Freq: Three times a day (TID) | ORAL | 0 refills | Status: AC | PRN
Start: 2022-06-30 — End: 2022-07-15

## 2022-06-30 NOTE — Addendum Note (Signed)
Addended by: Lyla Glassing on: 06/30/2022 12:06 PM     Modules accepted: Orders

## 2022-06-30 NOTE — Telephone Encounter (Addendum)
I called the [facility] They have already filled the prescription but the pt has not picked it up yet. She is currently staying in [place] and was trying to get it transferred somewhere closer to her. They are not able to transfer the RX now that it has been filled. Can you please cancel the [facility] and send a new prescription to the requested pharmacy? The pt is currently in a facility to help her find housing and is limited with transportation.

## 2022-06-30 NOTE — Progress Notes (Signed)
Interventional Radiology    [name] was on our schedule for kyphoplasty consultation 07/01/22    The patient has requested to reschedule due to her mental health and is planning to check herself in for treatment this week of unclear duration.    IR will remain available for kyphoplasty as needed    Thank you,  Kathee Polite, [place]

## 2022-06-30 NOTE — Telephone Encounter (Signed)
done 

## 2022-06-30 NOTE — Addendum Note (Signed)
Addended byHurshel Keys on: 06/30/2022 11:08 AM     Modules accepted: Orders

## 2022-07-01 ENCOUNTER — Ambulatory Visit: Payer: MEDICARE | Attending: Cardiovascular Disease | Primary: Family Medicine

## 2022-07-05 ENCOUNTER — Ambulatory Visit: Payer: MEDICARE | Primary: Family Medicine

## 2022-07-05 ENCOUNTER — Encounter

## 2022-07-05 MED ORDER — PREGABALIN 200 MG PO CAPS
200 MG | ORAL_CAPSULE | Freq: Three times a day (TID) | ORAL | 0 refills | Status: AC
Start: 2022-07-05 — End: 2022-08-04

## 2022-07-05 NOTE — Telephone Encounter (Signed)
Patient called and stated that she was just released from [facility] yesterday and hasn't had her Pregabalin in 2 days and she really needs it.    She stated that she's currently in the Crisis Shelter in [place] and need her rx of Pregabalin sent to [facility] in [place] on [address].    Patient can be reached at [phone].

## 2022-07-05 NOTE — Telephone Encounter (Signed)
I called the pt to talk to her about her message. The pt was identified using 2 pt identifiers. Mary [name] states that she just got off the phone with Mary [name], the pt advocate for the [facility]. They were going to let her come and get her medication bottle for the lyrica. She states that yesterday she was told it was locked up at the nurses' station. When she called them today to let them know that that she was coming to get her bottle, they could not find the medication at all. The pt got upset and states that she reached out to [facility]. He informed her that he will need to check the security cameras to see if anyone took the bottle. Mary [name] states that she was in the hospital for 2 weeks and should have had half a bottle there. The pt's mental health counselor, Mary [name], was also on the phone with the pt speaking on her behalf. He states that he was a witness to the whole conversation. He is asking for reconsideration on filling this medication a couple of days early because this medication helps the pt with her pain. I confirmed that they are still using the [facility] on [address] The message will be routed back to the provider for further review.

## 2022-07-05 NOTE — Telephone Encounter (Signed)
She filled #90 on 06/09/22 prescribed by Dr. Pamella Pert.  She is not due until 07/09/22. She probably has extra if she was IP and getting the med while there.

## 2022-07-05 NOTE — Telephone Encounter (Signed)
One month of lyrica pended for NP to review. I tried to reach the pt regarding this and was not able to get in touch with her. A message was left for the pt letting her know that a new prescription was being sent to the pharmacy for her for 30 days. I advised her that she will need to reach out to Dr. Iona Beard office to try and reschedule her consult. This will be to get her kyphoplasty evaluation. The number to our office was provided in case the pt has any questions or concerns.

## 2022-07-05 NOTE — Telephone Encounter (Signed)
Patient called again to follow up on below med, states she is currently in a Crisis House in [place] and is not allowed to leave to go get her prescription from [facility]. So she is asking for a prescription to be sent to the [facility] on [address].  [facility] will not send it to her, says she must pick it up in person. She hasn't taken the med since yesterday and states she cannot go without it for five more days while at the [facility].    Please advise patient, Tel. [phone].

## 2022-07-05 NOTE — Telephone Encounter (Signed)
If she cannot get it from the [facility] then ok for early refill.

## 2022-07-06 ENCOUNTER — Encounter: Payer: MEDICARE | Attending: Family | Primary: Family Medicine

## 2022-07-06 LAB — PROTIME-INR: INR: 1.3

## 2022-07-06 NOTE — Patient Instructions (Signed)
[name] is here today for anticoagulation monitoring for the diagnosis of Bioprosthetic Valve.  Her INR goal is 2.5-3.5 and her current Coumadin dose is 653m Mon Thur otherwise; 2.571mA.O.D.     Today's findings include an INR of 1.3     Considering [name] history, todays findings, and per the coumadin policy/protocol, [name] instructed to take Coumadin as follows,  7.53m66m/21/24-2/22/24.  She was also instructed to come back in 3 days for an INR check.    A full discussion of the nature of anticoagulants has been carried out.  A full discussion of the need for frequent and regular monitoring, precise dosage adjustment and compliance was stressed.  Side effects of potential bleeding were discussed and [name] instructed to call [phone] there are any signs of abnormal bleeding.  [name] instructed to avoid any OTC items containing aspirin or ibuprofen and prior to starting any new OTC products to consult with her physician or pharmacist to ensure no drug interactions are present.  [name] instructed to avoid any major changes in her general diet and to avoid alcohol consumption.  .      [name] her understanding of all instructions and will call the office with any questions, concerns, or signs of abnormal bleeding or blood clot.

## 2022-07-08 NOTE — Progress Notes (Signed)
Instructions for your surgery at [facility] Date:  07/08/2022      Patient's Name:  Mary [name]           Surgery Date:  07/19/2022              Please enter the main entrance of the hospital and check-in at the front desk located in the lobby. Once checked in at the front desk, you will take the elevators to the second floor, and report to the waiting room on the left. The room will say Procedure Registration.    Do NOT eat or drink anything, including candy, gum, or ice chips after midnight prior to your surgery, unless you have specific instructions from your surgeon or anesthesia provider to do so.  Brush your teeth before coming to the hospital. You may swish with water, but do not swallow.  No smoking/Vaping/E-Cigarettes 24 hours prior to the day of surgery.  No alcohol 24 hours prior to the day of surgery.  No recreational drugs for one week prior to the day of surgery.  Bring Photo ID, Insurance information, and Co-pay if required on day of surgery.  Bring in pertinent legal documents, such as, Medical Power of [place], DNR, Advance Directive, etc.  Leave all valuables, including money/purse, at home.  Remove all jewelry, including ALL body piercings, nail polish, acrylic nails, and makeup (including mascara); no lotions, powders, deodorant, or perfume/cologne/after shave on the skin.  Follow instruction for Hibiclens washes and CHG wipes from surgeon's office.   Glasses and dentures may be worn to the hospital. They must be removed prior to surgery. Please bring case/container for glasses or dentures.   Contact lenses should not be worn on day of surgery.   Call your doctor's office if symptoms of a cold or illness develop within 24-48 hours prior to your surgery.  Call your doctor's office if you have any questions concerning insurance or co-pays.  15. AN ADULT (relative or friend 77 years or older) [place].  16. Please make  arrangements for a responsible adult (18 years or older) to be with you for 24 hours after your surgery.   62. ONE VISITOR will be allowed in the waiting area during your surgery.  Exceptions may be made for surgical admissions, per nursing unit guidelines      Special Instructions:      Bring a list of CURRENT medications.  Follow instructions from the office regarding Blood Thinners.  Follow instructions from the office regarding medications to take the morning of surgery.         On day of surgery if you are running late, unable to make procedure time, or sick, please call the Pre-op department at [phone]    These surgical instructions were reviewed with Carlynn Spry during the PAT phone call.

## 2022-07-11 LAB — PROTIME-INR: INR: 2.3

## 2022-07-11 NOTE — Patient Instructions (Signed)
Left instructions  on patient voicemail   Mary [name] is here today for anticoagulation monitoring for the diagnosis of Bioprosthetic Valve .  Her INR goal is 2.5-3.5 and her current Coumadin dose is7.56m 07/06/22-07/07/22.  .     Today's findings include an INR of 2.3     Considering Ms. FDeolpast history, todays findings, and per the coumadin policy/protocol, Ms. FWadewas instructed to take Coumadin as follows,  5 mg alt 2.5 mg .  She was also instructed to come back in 1 weeks for an INR check.    A full discussion of the nature of anticoagulants has been carried out.  A full discussion of the need for frequent and regular monitoring, precise dosage adjustment and compliance was stressed.  Side effects of potential bleeding were discussed and Ms. FLabowas instructed to call [phone] there are any signs of abnormal bleeding.  Ms. FBearswas instructed to avoid any OTC items containing aspirin or ibuprofen and prior to starting any new OTC products to consult with her physician or pharmacist to ensure no drug interactions are present.  Ms. FFlegelwas instructed to avoid any major changes in her general diet and to avoid alcohol consumption.  .      Ms. FMullerverbalized her understanding of all instructions and will call the office with any questions, concerns, or signs of abnormal bleeding or blood clot.

## 2022-07-12 ENCOUNTER — Ambulatory Visit: Admit: 2022-07-12 | Discharge: 2022-07-12 | Payer: MEDICARE | Primary: Family Medicine

## 2022-07-12 ENCOUNTER — Inpatient Hospital Stay: Admit: 2022-07-12 | Payer: MEDICARE | Primary: Family Medicine

## 2022-07-12 ENCOUNTER — Encounter

## 2022-07-12 DIAGNOSIS — M65311 Trigger thumb, right thumb: Secondary | ICD-10-CM

## 2022-07-12 LAB — SENTARA SPECIMEN COLLECTION

## 2022-07-12 NOTE — H&P (Signed)
Mary [name] is a 57 y.o. female right handed.  Worker's Compensation and legal considerations: none    Chief Complaint   Patient presents with    H&P     Right trigger thumb release        07/12/2022 H&P HPI: Patient presents today for preoperative history and physical exam.  She is scheduled for a right trigger thumb release on 07/19/2022 with Dr. Redmond Pulling.  She denies any changes to her medical history since her last evaluation with [place].  Of note, she does have a history of a positive urine drug screen on 06/20/2022 that was positive for marijuana, cocaine, oxycodone.  She notes she has been abstinent over the past few weeks.    06/09/2022 HPI: Patient presents today due to recurrence of right thumb pain and locking.    11/08/2021 HPI: Patient presents today due to recurrence of right thumb pain and locking.  She reports her previous trigger thumb injection relieved most of her symptoms.     Initial HPI: Patient presents today for follow-up of right middle finger I&D of the dorsum of the finger as well as the flexor tendon sheath.  She reports to be doing well and has completed her antibiotics.  She denies any recurrence of pain.  She reports some stiffness in the finger and she is doing some exercises at home.  She reports no one is called her about therapy yet.    Date of onset: January 2023  Injury: No  Prior Treatment:  Yes: Comment: Right trigger thumb injection.  Debridement of right middle finger.    ROS: Review of Systems - General ROS: negative except HPI    Past Medical History:   Diagnosis Date    Anxiety     Bipolar 1 disorder (HCC)     CHF (congestive heart failure) (HCC)     COPD (chronic obstructive pulmonary disease) (HCC)     DDD (degenerative disc disease), cervical     GERD (gastroesophageal reflux disease)     H/O aortic valve replacement     History of transient ischemic attack (TIA)     Mitral regurgitation and mitral stenosis     Polysubstance abuse ([place]) 06/2022    cocoaine, opioid, and THC+ in ED  UDS    PTSD (post-traumatic stress disorder)     Scoliosis     Vitamin D deficiency        Past Surgical History:   Procedure Laterality Date    CARDIAC CATHETERIZATION      CARDIAC VALVE SURGERY      mitral valve replacement    CESAREAN SECTION      COLONOSCOPY      HAND/FINGER SURGERY UNLISTED Right     I&D right middle finger    HYSTERECTOMY (CERVIX STATUS UNKNOWN)      IR KYPHOPLASTY THORACIC FIRST LEVEL  03/03/2022    IR KYPHOPLASTY THORACIC FIRST LEVEL 03/03/2022 Samoilov, Dmitri E, MD [facility] RAD ANGIO IR    KNEE SURGERY      OVARY REMOVAL          Current Outpatient Medications   Medication Sig Dispense Refill    hydrOXYzine pamoate (VISTARIL) 25 MG capsule       OXcarbazepine (TRILEPTAL) 150 MG tablet Take 1 tablet by mouth 2 times daily      prazosin (MINIPRESS) 2 MG capsule       simvastatin (ZOCOR) 20 MG tablet       pregabalin (LYRICA) 200 MG capsule Take 1 capsule  by mouth 3 times daily for 30 days. Max Daily Amount: 600 mg 90 capsule 0    baclofen (LIORESAL) 10 MG tablet Take 1 tablet by mouth as needed      warfarin (COUMADIN) 2.5 MG tablet Take 3 tablets by mouth daily 270 tablet 2    albuterol sulfate HFA (PROVENTIL;VENTOLIN;PROAIR) 108 (90 Base) MCG/ACT inhaler Inhale 2 puffs into the lungs every 6 hours as needed for Wheezing 18 g 0    traZODone (DESYREL) 150 MG tablet Take 0.5 tablets by mouth nightly 15 tablet 0    empagliflozin (JARDIANCE) 10 MG tablet Take 1 tablet by mouth daily 30 tablet 0    lurasidone (LATUDA) 40 MG TABS tablet Take 1 tablet by mouth Daily with supper (Patient taking differently: Take 1.5 tablets by mouth Daily with supper) 30 tablet 0    metoprolol succinate (TOPROL XL) 50 MG extended release tablet Take 1 tablet by mouth daily 30 tablet 0    fluticasone (FLONASE) 50 MCG/ACT nasal spray 1 spray by Each Nostril route daily 16 g 0    lidocaine 4 % external patch Place 1 patch onto the skin daily as needed (Mild Pain) 15 patch 0    docusate (COLACE, DULCOLAX) 100 MG CAPS Take  100 mg by mouth daily 30 capsule 0    omeprazole (PRILOSEC) 40 MG delayed release capsule Take 1 capsule by mouth daily 30 capsule 1    TRELEGY ELLIPTA 100-62.5-25 MCG/ACT AEPB inhaler inhale 1 puff by mouth and INTO THE LUNGS once daily      Ubrogepant (UBRELVY) 100 MG TABS Take 100 mg by mouth as needed (Migraine) Take 100 mg at onset of headache; can repeat in 2 hours if no change; not to be taken more than 2 days a week. 10 tablet 3    vitamin B-12 (CYANOCOBALAMIN) 1000 MCG tablet Take 1 tablet by mouth daily      aspirin 81 MG chewable tablet Take 1 tablet by mouth daily      furosemide (LASIX) 40 MG tablet Take 1 tablet by mouth daily as needed       No current facility-administered medications for this visit.       Allergies   Allergen Reactions    Lithium Nausea And Vomiting     Vomiting      Alprazolam Other (See Comments)     Gets suicidal    Dermatitis Antigen      Rash and blisers  Rash and blisers    Diazepam Other (See Comments)     Gets suicidal    Levofloxacin Other (See Comments)     Muscle stiffness and bulging muscles     Tobacco Other (See Comments)     Increasing lung deterioration     Adhesive Tape Rash     blisters         BP 112/73 (Site: Left Upper Arm, Position: Sitting, Cuff Size: Large Adult)   Pulse 62   Temp 97 F (36.1 C) (Temporal)   Ht 1.575 m (5' 2"$ )   Wt 73 kg (161 lb)   BMI 29.45 kg/m   Physical Exam  Vitals and nursing note reviewed.   Constitutional:       General: She is not in acute distress.     Appearance: Normal appearance. She is not ill-appearing.   Cardiovascular:      Pulses: Normal pulses.   Pulmonary:      Effort: Pulmonary effort is normal. No respiratory distress.   Musculoskeletal:  General: Swelling and tenderness present. No deformity or signs of injury. Normal range of motion.      Cervical back: Normal range of motion and neck supple.      Right lower leg: No edema.      Left lower leg: No edema.   Skin:     General: Skin is warm and dry.       Capillary Refill: Capillary refill takes less than 2 seconds.      Findings: No bruising or erythema.   Neurological:      General: No focal deficit present.      Mental Status: She is alert and oriented to person, place, and time.   Psychiatric:         Mood and Affect: Mood normal.         Behavior: Behavior normal.          Hand:    Examination L Digit(s) R Digit(s)   1st CMC Tenderness -  -    1st CMC Grind -  -    Bouchard Nodes -  -    [place]den Nodes -  -    A1 Pulley Tenderness -  + Th   Triggering -  +    UCL Instability -  -    RCL Instability -  -    Lateral Stress Pain -  -    Palmar Cords -  -    Tabletop test -  -    Garrod's Pads -  -    Grip Strength       Pinch Strength         ROM: Full        Imaging:     None indicated      Impression     Diagnosis Orders   1. Trigger thumb, right thumb        2. Preop examination        3. Positive urine drug screen        4. Anticoagulated                Plan:     Labs and urine drug screen today.  I advised the patient that if there is cocaine in her urine drug screen today, her surgery will be canceled.    Awaiting recommendations from cardiology regarding Coumadin and aspirin.  Will call the patient.    Proceed as scheduled right trigger thumb release.    Discussed operative versus nonoperative treatment, postoperative convalescence, and answered all questions.    Return in about 3 weeks (around 08/02/2022) for Post op.     Plan was reviewed with patient, who verbalized agreement and understanding of the plan    Note: This note was completed using voice recognition software.  Any typographical/name errors or mistakes are unintentional.

## 2022-07-13 LAB — UDS COMPLETE W/ CONFIRMATION
Specific Gravity, UA: 1.012 (ref 1.003–1.030)
pH, Urine: 5.5 (ref 4.5–8.0)

## 2022-07-13 LAB — COMPREHENSIVE METABOLIC PANEL
ALT: 19 U/L (ref 5–40)
AST: 21 U/L (ref 10–37)
Albumin/Globulin Ratio: 2.1 ratio (ref 1.1–2.6)
Albumin: 4.9 g/dL (ref 3.5–5.0)
Alkaline Phosphatase: 80 U/L (ref 25–115)
Anion Gap: 13 mmol/L (ref 3.0–15.0)
BUN: 13 mg/dL (ref 6–22)
CO2: 28 mmol/L (ref 20–32)
Calcium: 10.1 mg/dL (ref 8.4–10.5)
Chloride: 98 mmol/L (ref 98–110)
Creatinine: 1.2 mg/dL (ref 0.5–1.2)
Globulin: 2.3 g/dL (ref 2.0–4.0)
Glomerular Filtration Rate: 51.4 mL/min/{1.73_m2} — ABNORMAL LOW (ref >60.0)
Glucose: 85 mg/dL (ref 70–99)
Potassium: 4.2 mmol/L (ref 3.5–5.5)
Sodium: 139 mmol/L (ref 133–145)
Total Bilirubin: 0.6 mg/dL (ref 0.2–1.2)
Total Protein: 7.2 g/dL (ref 6.4–8.3)

## 2022-07-13 LAB — CBC WITH AUTO DIFFERENTIAL
Basophils Absolute: 0.1 10*3/uL (ref 0.0–0.2)
Basophils: 1 % (ref 0–2)
Eosinophils Absolute: 0.1 10*3/uL (ref 0.0–0.5)
Eosinophils: 1 % (ref 0–6)
Hematocrit: 45.8 % (ref 35.1–48.0)
Hemoglobin: 13.6 g/dL (ref 11.7–16.0)
Lymphocytes Absolute: 2.8 10*3/uL (ref 1.0–4.8)
Lymphocytes: 45 % (ref 20–45)
MCH: 29 pg (ref 26–34)
MCHC: 30 g/dL — ABNORMAL LOW (ref 31–36)
MCV: 97 fL (ref 80–99)
MPV: 10.6 fL (ref 9.0–13.0)
Monocytes Absolute: 0.3 10*3/uL (ref 0.1–1.0)
Monocytes: 6 % (ref 3–12)
Neutrophils Absolute: 2.9 10*3/uL (ref 1.8–7.7)
Neutrophils: 47 % (ref 40–75)
Platelets: 311 10*3/uL (ref 140–440)
RBC: 4.71 M/uL (ref 3.80–5.20)
RDW: 14 % (ref 10.0–15.5)
WBC: 6.1 10*3/uL (ref 4.0–11.0)

## 2022-07-14 NOTE — Telephone Encounter (Signed)
Received cardiac clearance for patient's upcoming surgery on 3/5, however instructions for holding Warfarin before surgery were not included.      Called Dr. Kennedy Bucker office for clarification and I was given a verbal order that patient may hold her Warfarin 5 days.  Instructions were relayed to the patient along with instructions to continue taking her aspirin, but we will need those instructions in writing for the hospital.     Can a letter or documentation note be placed into the chart stating those instructions on holding the Warfarin?

## 2022-07-18 NOTE — Discharge Instructions (Addendum)
Do not remove dressing for 3 days    Keep dressing clean and dry. Cover for showering.    Ice the wound/dressing multiple times per day to help with swelling    Elevate operative wound/dressing    Take post operative medications as indicated on the prescription label    Begin moving fingers immediately after surgery and continue moving fingers into a fist every hour that you're awake     You may experience significant bruising after surgery and it may extend up to your elbow. This is very common.     The following applies to you if you are instructed to remove your bandage after 3 days:   Do not apply Peroxide, Neosporin, Vitamin E oil, or any ointment to the area.   please wash the incisions gently with ANTI-BACTERIAL SOAP and WATER only.   Do not submerge in bath tub or dirty dish water  Cover incision with a band-aid as needed   Ok to shower as normal     Reasons to call our office:  You remove the bandage (only if instructed) and notice the incision(s) is/are red, hot, warm, draining yellow/green/brown and appears infected.  You are having an allergic reaction to post operative medications   Your pain is significantly uncontrolled on the prescribed post operative regimen

## 2022-07-19 ENCOUNTER — Inpatient Hospital Stay: Payer: Medicare (Managed Care) | Attending: Orthopaedic Surgery

## 2022-07-19 LAB — URINE DRUG SCREEN
Amphetamine, Urine: NEGATIVE
Barbiturates, Urine: NEGATIVE
Benzodiazepines, Urine: NEGATIVE
Cocaine, Urine: POSITIVE — AB
Methadone, Urine: NEGATIVE
Opiates, Urine: NEGATIVE
PCP, Urine: NEGATIVE
THC, TH-Cannabinol, Urine: POSITIVE — AB

## 2022-07-19 MED ORDER — GLUCOSE 4 G PO CHEW
4 | ORAL | Status: DC | PRN
Start: 2022-07-19 — End: 2022-07-19

## 2022-07-19 MED ORDER — HYDROCODONE-ACETAMINOPHEN 5-325 MG PO TABS
5-325 MG | ORAL_TABLET | Freq: Four times a day (QID) | ORAL | 0 refills | Status: DC | PRN
Start: 2022-07-19 — End: 2022-07-19

## 2022-07-19 MED ORDER — INSULIN LISPRO 100 UNIT/ML IJ SOLN
100 | Freq: Once | INTRAMUSCULAR | Status: DC
Start: 2022-07-19 — End: 2022-07-19

## 2022-07-19 MED ORDER — DEXTROSE 10 % IV BOLUS
INTRAVENOUS | Status: DC | PRN
Start: 2022-07-19 — End: 2022-07-19

## 2022-07-19 MED ORDER — DICLOFENAC SODIUM 50 MG PO TBEC
50 | ORAL_TABLET | Freq: Three times a day (TID) | ORAL | 0 refills | Status: DC
Start: 2022-07-19 — End: 2022-07-19

## 2022-07-19 MED ORDER — PROPOFOL 200 MG/20ML IV EMUL
200 | INTRAVENOUS | Status: AC
Start: 2022-07-19 — End: ?

## 2022-07-19 MED ORDER — LIDOCAINE HCL (PF) 2 % IJ SOLN
2 | INTRAMUSCULAR | Status: AC
Start: 2022-07-19 — End: ?

## 2022-07-19 MED ORDER — FENTANYL CITRATE (PF) 100 MCG/2ML IJ SOLN
100 | INTRAMUSCULAR | Status: AC
Start: 2022-07-19 — End: ?

## 2022-07-19 MED ORDER — NORMAL SALINE FLUSH 0.9 % IV SOLN
0.9 | Freq: Two times a day (BID) | INTRAVENOUS | Status: DC
Start: 2022-07-19 — End: 2022-07-19

## 2022-07-19 MED ORDER — SODIUM CHLORIDE 0.9 % IV SOLN
0.9 | INTRAVENOUS | Status: DC | PRN
Start: 2022-07-19 — End: 2022-07-19

## 2022-07-19 MED ORDER — FAMOTIDINE 20 MG PO TABS
20 | Freq: Once | ORAL | Status: AC
Start: 2022-07-19 — End: 2022-07-19
  Administered 2022-07-19: 12:00:00 20 mg via ORAL

## 2022-07-19 MED ORDER — BUPIVACAINE HCL (PF) 0.25 % IJ SOLN
0.25 | INTRAMUSCULAR | Status: AC
Start: 2022-07-19 — End: ?

## 2022-07-19 MED ORDER — LIDOCAINE HCL 1% INJ (MIXTURES ONLY)
1 | INTRAMUSCULAR | Status: AC
Start: 2022-07-19 — End: ?

## 2022-07-19 MED ORDER — NORMAL SALINE FLUSH 0.9 % IV SOLN
0.9 | INTRAVENOUS | Status: DC | PRN
Start: 2022-07-19 — End: 2022-07-19

## 2022-07-19 MED ORDER — GLUCAGON (RDNA) 1 MG IJ KIT
1 | INTRAMUSCULAR | Status: DC | PRN
Start: 2022-07-19 — End: 2022-07-19

## 2022-07-19 MED ORDER — LACTATED RINGERS IV SOLN
INTRAVENOUS | Status: DC
Start: 2022-07-19 — End: 2022-07-19
  Administered 2022-07-19: 12:00:00 via INTRAVENOUS

## 2022-07-19 MED ORDER — STERILE WATER FOR INJECTION (MIXTURES ONLY)
1 | INTRAMUSCULAR | Status: DC
Start: 2022-07-19 — End: 2022-07-19

## 2022-07-19 MED ORDER — DEXTROSE 10 % IV SOLN
10 | INTRAVENOUS | Status: DC | PRN
Start: 2022-07-19 — End: 2022-07-19

## 2022-07-19 MED FILL — BD POSIFLUSH 0.9 % IV SOLN: 0.9 % | INTRAVENOUS | Qty: 40

## 2022-07-19 MED FILL — XYLOCAINE-MPF 1 % IJ SOLN: 1 % | INTRAMUSCULAR | Qty: 30

## 2022-07-19 MED FILL — LACTATED RINGERS IV SOLN: INTRAVENOUS | Qty: 1000

## 2022-07-19 MED FILL — SENSORCAINE-MPF 0.25 % IJ SOLN: 0.25 % | INTRAMUSCULAR | Qty: 30

## 2022-07-19 MED FILL — DIPRIVAN 200 MG/20ML IV EMUL: 200 MG/20ML | INTRAVENOUS | Qty: 20

## 2022-07-19 MED FILL — CEFAZOLIN SODIUM 1 G IJ SOLR: 1 g | INTRAMUSCULAR | Qty: 2000

## 2022-07-19 MED FILL — FENTANYL CITRATE (PF) 100 MCG/2ML IJ SOLN: 100 MCG/2ML | INTRAMUSCULAR | Qty: 2

## 2022-07-19 MED FILL — FAMOTIDINE 20 MG PO TABS: 20 MG | ORAL | Qty: 1

## 2022-07-19 MED FILL — XYLOCAINE-MPF 2 % IJ SOLN: 2 % | INTRAMUSCULAR | Qty: 5

## 2022-07-19 NOTE — Progress Notes (Signed)
Patient tested positive for cocaine today and case was canceled in line with anesthesia protocol

## 2022-07-19 NOTE — Other (Signed)
Patient /Family /Designee has been informed that [facility] is not responsible for patient belongings per policy and the signed [facility] Patient Agreement document.  Personal items should be sent home or checked in with security.  Patient /Family /Designee selected the following action:                            []  Send personal items home with a family member or friend                                                 []  Check in personal items with security, excluding clothing                            [x]  Maintain personal items at the bedside, against recommendation                                 by [facility]                                   ** If patient /family /designee chooses to maintain personal items at the bedside,                                      Complete the patient belongings inventory in the EMR.

## 2022-07-19 NOTE — Anesthesia Pre-Procedure Evaluation (Signed)
Department of Anesthesiology  Preprocedure Note       Name:  Mary [name]   Age:  57 y.o.  DOB:  1966-03-16                                          MRN:  [id number]         Date:  07/19/2022      Surgeon: Juliann Mule):  Lissa Hoard A, DO    Procedure: Procedure(s):  RIGHT THUMB TRIGGER RELEASE    Medications prior to admission:   Prior to Admission medications    Medication Sig Start Date End Date Taking? Authorizing Provider   HYDROcodone-acetaminophen (NORCO) 5-325 MG per tablet Take 1 tablet by mouth every 6 hours as needed for Pain for up to 3 days. Intended supply: 5 days. Take lowest dose possible to manage pain Max Daily Amount: 4 tablets 07/19/22 07/22/22 Yes Wilson, Shawn A, DO   diclofenac (VOLTAREN) 50 MG EC tablet Take 1 tablet by mouth 3 times daily (with meals) for 10 days 07/19/22 07/29/22 Yes Wilson, Shawn A, DO   OXcarbazepine (TRILEPTAL) 150 MG tablet Take 1 tablet by mouth 2 times daily 06/23/22  Yes [provider]   hydrOXYzine pamoate (VISTARIL) 25 MG capsule  07/04/22   [provider]   prazosin (MINIPRESS) 2 MG capsule  07/04/22   [provider]   simvastatin (ZOCOR) 20 MG tablet  06/23/22   [provider]   pregabalin (LYRICA) 200 MG capsule Take 1 capsule by mouth 3 times daily for 30 days. Max Daily Amount: 600 mg 07/05/22 08/04/22  Blount, Melene Plan, APRN - NP   baclofen (LIORESAL) 10 MG tablet Take 1 tablet by mouth as needed    [provider]   warfarin (COUMADIN) 2.5 MG tablet Take 3 tablets by mouth daily 05/25/22   Gaston Islam, MD   albuterol sulfate HFA (PROVENTIL;VENTOLIN;PROAIR) 108 (90 Base) MCG/ACT inhaler Inhale 2 puffs into the lungs every 6 hours as needed for Wheezing  Patient not taking: Reported on 07/19/2022 05/23/22   Mariana Arn, MD   traZODone (DESYREL) 150 MG tablet Take 0.5 tablets by mouth nightly 05/23/22   Mariana Arn, MD   empagliflozin (JARDIANCE) 10 MG tablet Take 1 tablet by mouth daily 05/24/22   Mariana Arn, MD    lurasidone (LATUDA) 40 MG TABS tablet Take 1 tablet by mouth Daily with supper  Patient taking differently: Take 1.5 tablets by mouth Daily with supper 05/23/22   Mariana Arn, MD   metoprolol succinate (TOPROL XL) 50 MG extended release tablet Take 1 tablet by mouth daily 05/24/22   Mariana Arn, MD   fluticasone Asencion Islam) 50 MCG/ACT nasal spray 1 spray by Each Nostril route daily  Patient not taking: Reported on 07/19/2022 05/24/22   Mariana Arn, MD   lidocaine 4 % external patch Place 1 patch onto the skin daily as needed (Mild Pain)  Patient not taking: Reported on 07/19/2022 05/23/22   Mariana Arn, MD   docusate (COLACE, DULCOLAX) 100 MG CAPS Take 100 mg by mouth daily  Patient not taking: Reported on 07/19/2022 05/23/22   Mariana Arn, MD   omeprazole (PRILOSEC) 40 MG delayed release capsule Take 1 capsule by mouth daily 05/08/22   Natale Milch, MD   TRELEGY ELLIPTA 100-62.5-25 MCG/ACT AEPB inhaler inhale 1 puff  by mouth and INTO THE LUNGS once daily 03/17/22   [provider]   Ubrogepant (UBRELVY) 100 MG TABS Take 100 mg by mouth as needed (Migraine) Take 100 mg at onset of headache; can repeat in 2 hours if no change; not to be taken more than 2 days a week.  Patient not taking: Reported on 07/19/2022 12/10/21   Jeralyn Bennett, MD   vitamin B-12 (CYANOCOBALAMIN) 1000 MCG tablet Take 1 tablet by mouth daily    [provider]   aspirin 81 MG chewable tablet Take 1 tablet by mouth daily    Automatic Reconciliation, Ar   furosemide (LASIX) 40 MG tablet Take 1 tablet by mouth daily as needed    Automatic Reconciliation, Ar       Current medications:    Current Facility-Administered Medications   Medication Dose Route Frequency Provider Last Rate Last Admin   . sodium chloride flush 0.9 % injection 5-40 mL  5-40 mL IntraVENous 2 times per day [place] Pulling, Shawn A, DO       . sodium chloride flush 0.9 % injection 5-40 mL  5-40 mL IntraVENous PRN Wilson, Shawn A, DO       . 0.9 % sodium  chloride infusion   IntraVENous PRN Wilson, Shawn A, DO       . ceFAZolin (ANCEF) 2,000 mg in sterile water 20 mL IV syringe  2,000 mg IntraVENous On Call to [place], Shawn A, DO       . insulin lispro (HUMALOG) injection vial 0-12 Units  0-12 Units SubCUTAneous Once Junita Push, APRN - CRNA       . glucose chewable tablet 16 g  4 tablet Oral PRN Junita Push, APRN - CRNA       . dextrose bolus 10% 125 mL  125 mL IntraVENous PRN Junita Push, APRN - CRNA        Or   . dextrose bolus 10% 250 mL  250 mL IntraVENous PRN Junita Push, APRN - CRNA       . glucagon injection 1 mg  1 mg SubCUTAneous PRN Junita Push, APRN - CRNA       . dextrose 10 % infusion   IntraVENous Continuous PRN Junita Push, APRN - CRNA       . lactated ringers IV soln infusion   IntraVENous Continuous Junita Push, APRN - CRNA 75 mL/hr at 07/19/22 0706 New Bag at 07/19/22 0706   . sodium chloride flush 0.9 % injection 5-40 mL  5-40 mL IntraVENous 2 times per day Junita Push, APRN - CRNA       . sodium chloride flush 0.9 % injection 5-40 mL  5-40 mL IntraVENous PRN Junita Push, APRN - CRNA       . 0.9 % sodium chloride infusion   IntraVENous PRN Junita Push, APRN - CRNA           Allergies:    Allergies   Allergen Reactions   . Lithium Nausea And Vomiting     Vomiting     . Alprazolam Other (See Comments)     Gets suicidal   . Dermatitis Antigen      Rash and blisers  Rash and blisers   . Diazepam Other (See Comments)     Gets suicidal   . Levofloxacin Other (See Comments)     Muscle stiffness and bulging muscles    . Tobacco Other (See Comments)  Increasing lung deterioration    . Adhesive Tape Rash     blisters       Problem List:    Patient Active Problem List   Diagnosis Code   . 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 Onychomycosis B35.1   . DDD (degenerative disc disease), lumbosacral M51.37   . Dyslipidemia E78.5   . Nicotine dependence F17.200   . Severe obesity with body  mass index (BMI) of 35.0 to 39.9 with serious comorbidity (HCC) E66.01   . Asthma J45.909   . Antral gastritis K29.50   . Osteopenia M85.80   . Hyperlipidemia E78.5   . Diabetes mellitus ([place]) E11.9   . Elevated INR R79.1   . Moderate to severe pulmonary hypertension (HCC) I27.20   . Onycholysis L60.1   . Nodule of right lung R91.1   . Respiratory failure ([place]) J96.90   . Diastolic CHF, chronic (HCC) I50.32   . Allergic rhinitis J30.9   . 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 History of mitral valve replacement with bioprosthetic valve Z95.3   . Severe obesity (BMI >= 40) (HCC) E66.01   . 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 Anxiety disorder F41.9   . Suppurative tenosynovitis of flexor tendon of right hand M65.141   . Chronic obstructive pulmonary disease (HCC) J44.9   . Bipolar I disorder, most recent episode depressed, severe without psychotic features ([place]) F31.4   . 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 TIA (transient ischemic attack) G45.9   . Lumbar compression fracture (HCC) S32.000A   . Afib ([place]) I48.91   . Mitral regurgitation I34.0   . Chronic pain syndrome G89.4   . Chronic bilateral thoracic back pain M54.6, G89.29   . Thoracic compression fracture, sequela S22.000S   . Compression fracture of T6 vertebra (HCC) S22.050A   . Hx of kyphoplasty, T8 Z98.890   . Localized osteoporosis with current pathological fracture M80.80XA   . Trigger thumb of right hand M65.311       Past Medical History:        Diagnosis Date   . Anxiety    . Bipolar 1 disorder ([place])    . CHF (congestive heart failure) ([place])    . COPD (chronic obstructive pulmonary disease) ([place])    . DDD (degenerative disc disease), cervical    . GERD (gastroesophageal reflux disease)    . H/O aortic valve replacement    . History of transient ischemic attack (TIA)    . Mitral regurgitation and mitral stenosis    . Polysubstance abuse ([place]) 06/2022    cocoaine, opioid, and THC+ in ED UDS   . PTSD (post-traumatic stress  disorder)    . Scoliosis    . Vitamin D deficiency        Past Surgical History:        Procedure Laterality Date   . CARDIAC CATHETERIZATION     . CARDIAC VALVE SURGERY      mitral valve replacement   . CESAREAN SECTION     . COLONOSCOPY     . HAND/FINGER SURGERY UNLISTED Right     I&D right middle finger   . HYSTERECTOMY (CERVIX STATUS UNKNOWN)     . IR KYPHOPLASTY THORACIC FIRST LEVEL  03/03/2022    IR KYPHOPLASTY THORACIC FIRST LEVEL 03/03/2022 Samoilov, Dmitri E, MD [facility] RAD ANGIO IR   . KNEE SURGERY     . OVARY REMOVAL  Social History:    Social History     Tobacco Use   . Smoking status: Every Day     Current packs/day: 0.75     Types: Cigarettes     Passive exposure: Current   . Smokeless tobacco: Never   Substance Use Topics   . Alcohol use: Never                                Ready to quit: Not Answered  Counseling given: Not Answered      Vital Signs (Current):   Vitals:    07/08/22 1328 07/19/22 0700   BP:  130/84   Pulse:  80   Resp:  16   Temp:  98.3 F (36.8 C)   TempSrc:  Oral   SpO2:  94%   Weight: 68.9 kg (152 lb) 72.1 kg (159 lb)   Height: 1.575 m ('5\' 2"'$ ) 1.575 m (5' 2.01")                                              BP Readings from Last 3 Encounters:   07/19/22 130/84   07/12/22 112/73   05/25/22 125/88       NPO Status: Time of last liquid consumption: 0500 (sips of water w/meds)                        Time of last solid consumption: 2100                        Date of last liquid consumption: 07/19/22                        Date of last solid food consumption: 07/18/22    BMI:   Wt Readings from Last 3 Encounters:   07/19/22 72.1 kg (159 lb)   07/12/22 73 kg (161 lb)   05/25/22 69.1 kg (152 lb 6.4 oz)     Body mass index is 29.07 kg/m.    CBC:   Lab Results   Component Value Date/Time    WBC 6.1 07/11/2022 12:01 PM    WBC 6.6 05/15/2022 06:00 PM    RBC 4.71 07/11/2022 12:01 PM    HGB 13.6 07/11/2022 12:01 PM    HCT 45.8 07/11/2022 12:01 PM    MCV 97 07/11/2022 12:01 PM    RDW 14.0  07/11/2022 12:01 PM    PLT 311 07/11/2022 12:01 PM       CMP:   Lab Results   Component Value Date/Time    NA 139 07/11/2022 12:01 PM    K 4.2 07/11/2022 12:01 PM    CL 98 07/11/2022 12:01 PM    CO2 28 07/11/2022 12:01 PM    BUN 13 07/11/2022 12:01 PM    CREATININE 1.2 07/11/2022 12:01 PM    GFRAA >60 12/09/2020 09:54 AM    AGRATIO 2.1 07/11/2022 12:01 PM    LABGLOM >60 05/15/2022 06:00 PM    GLUCOSE 85 07/11/2022 12:01 PM    PROT 7.2 07/11/2022 12:01 PM    CALCIUM 10.1 07/11/2022 12:01 PM    BILITOT 0.6 07/11/2022 12:01 PM    ALKPHOS 80 07/11/2022 12:01 PM    AST 21 07/11/2022 12:01 PM  ALT 19 07/11/2022 12:01 PM       POC Tests: No results for input(s): "POCGLU", "POCNA", "POCK", "POCCL", "POCBUN", "POCHEMO", "POCHCT" in the last 72 hours.    Coags:   Lab Results   Component Value Date/Time    PROTIME 30.5 05/23/2022 06:21 AM    INR 2.30 07/11/2022 12:00 AM    INR 2.2 04/07/2020 09:20 AM    APTT 30.5 03/03/2022 09:26 AM    APTT 46.2 05/04/2020 10:48 AM       HCG (If Applicable): No results found for: "PREGTESTUR", "PREGSERUM", "HCG", "HCGQUANT"     ABGs:   Lab Results   Component Value Date/Time    PHART 7.48 05/08/2020 04:35 AM    PO2ART 64 05/08/2020 04:35 AM    PCO2ART 26.9 05/08/2020 04:35 AM    HCO3ART 19.9 05/08/2020 04:35 AM    BEART 0.7 05/03/2020 05:03 PM        Type & Screen (If Applicable):  No results found for: "LABABO", "LABRH"    Drug/Infectious Status (If Applicable):  No results found for: "HIV", "HEPCAB"    COVID-19 Screening (If Applicable):   Lab Results   Component Value Date/Time    COVID19 Not detected 05/15/2022 06:00 PM           Anesthesia Evaluation    Airway: Mallampati: II  TM distance: >3 FB   Neck ROM: full  Mouth opening: > = 3 FB   Dental:    (+) edentulous      Pulmonary:normal exam    (+)  COPD: mild,          asthma: current smoker                           Cardiovascular:    (+) valvular problems/murmurs: MR and AS, dysrhythmias: atrial fibrillation, CHF:      ECG  reviewed  Rhythm: regular  Rate: normal  Echocardiogram reviewed  Stress test reviewed             ROS comment: Echo shows prosthetic MV, some mod stenosis. AV has mod regurg and mod stenosis. Mod Pulm HTN. Dilated LV, EF 50-55%. Had LA clip procedure. On Coumadin, stopped 2/28.       Neuro/Psych:   (+) neuromuscular disease:, TIA, psychiatric history:            GI/Hepatic/Renal:   (+) GERD: well controlled          Endo/Other:                     Abdominal: normal exam            Vascular:          Other Findings:         Anesthesia Plan      MAC     ASA 3       Induction: intravenous.    MIPS: Postoperative opioids intended.  Anesthetic plan and risks discussed with patient.                    Jonette Pesa, MD   07/19/2022

## 2022-07-19 NOTE — Other (Signed)
Per Dr. Drue Flirt, no additional INR needed

## 2022-07-19 NOTE — Progress Notes (Signed)
Formatting of this note might be different from the original.  Images from the original note were not included.  [facility] Management - Quality Opportunity - Medication Adherence or Statin Utilization    07/19/2022    Mary [name]  11-29-1965  PCP: Vincenza Hews, MD  Date of last PCP Visit:   Not Available  Upcoming PCP Visit:   Not Available    Insurance Information: [facility] [place]    Patient has been identified by: [facility] MA for the following quality opportunity DM Rx and STATIN.    Per electronic claims data, JARDIANCE was last filled on 07/04/2022 for a 14-day supply, refill due 07/18/2022.     Pharmacy:     Per electronic claims data, SIMVASTATIN was last filled on 06/23/2022 for a 30-day supply, refill due 07/23/2022.     Pharmacy:     INTERVENTIONS (select all that apply)    Medication Adherence    '[x]'$  Contact attempt made to patient during this encounter: Yes - Attempt #1 - Patient      CC noticed pt has used multiple pharmacies recently. CC tried calling pt to verify which pharmacy Jardiance and Simvastatin were last filled. CC left HIPPA compliant voicemail for the patient requesting a call back.     CC did not receive a return call before the end of day.     No additional chart review available at this time.      Assessed barriers to medication adherence:  '[]'$  Unable to refill (needs prescription(s) sent to outpatient pharmacy etc.)  '[]'$  Does not remember to take medications  '[]'$  Confusion or fears about the medication  '[]'$  Expenses/High Cost of Medications  '[]'$  Medication side effects   '[]'$  Difficulty with medication administration  '[]'$  Difficulty reading bottle labels  '[]'$  N/A - Patient reports compliance. Medication has been recently filled and patient is taking as prescribed. Last Fill Date:  '[]'$  N/A - Patient is no longer on this medication.    Addressed barriers to medication adherence:       '[]'$  Discussed the importance and benefits of their medication regimen       '[]'$ Provided education and  resources:        '[]'$  Contacted the patients pharmacy/medical prescriber for refills       '[]'$  Patient transitioned to mail order pharmacy?       '[]'$  Referred to Clinical Pharmacy Specialist for medication management    Patient verbalized understanding and appreciative of recommendations.      FOLLOW UP RECOMMENDATION/PLAN:     Attempt # 2 to reach the patient has been scheduled by Care Coordinator on 07/20/2022.    Mary [name], [place] Coordinator, Pharmacy  [facility]  [phone]  [email or web address]  Mary [name], [place]      Electronically signed by Mary [name] at 07/19/2022  4:59 PM EST

## 2022-07-19 NOTE — H&P (Signed)
Update History & Physical    The patient's History and Physical of July 12, 2022 was reviewed with the patient and I examined the patient. There was no change. The surgical site was confirmed by the patient and me.     Plan: The risks, benefits, expected outcome, and alternative to the recommended procedure have been discussed with the patient. Patient understands and wants to proceed with the procedure.     Electronically signed by Birdena Jubilee, DO on 07/19/2022 at 7:29 AM

## 2022-07-20 NOTE — Progress Notes (Signed)
Patient's surgery 3/5 cancelled due to positive UDS.  Patient called to offer a reschedule date.  Patient stated "I'm in the middle of something right now.  I will have to call you back."  Patient's PO appt cancelled.   Patient may be rescheduled when she calls back with the understanding that she must have a clean drug screen.

## 2022-07-20 NOTE — Progress Notes (Signed)
Formatting of this note might be different from the original.  Images from the original note were not included.  [place] Management - Quality Opportunity - Medication Adherence or Statin Utilization    07/20/2022    Mary [name]  August 02, 1965  PCP: Mary Hews, MD  Date of last PCP Visit:   Not Available  Upcoming PCP Visit:   Not Available    Insurance Information: [facility] [place]    Patient has been identified by: [facility] MA for the following quality opportunity DM Rx and STATIN.    Per electronic claims data, JARDIANCE was last filled on 07/04/2022 for a 14-day supply, refill due 07/18/2022.     Pharmacy:     Per electronic claims data, SIMVASTATIN was last filled on 06/23/2022 for a 30-day supply, refill due 07/23/2022. (Proactive medication review)    Pharmacy: [facility] [phone]     INTERVENTIONS (select all that apply)    Medication Adherence    [x]  Contact attempt made to patient during this encounter: Yes - Attempt #2 - Patient      CC called and spoke w/ pt. Call answered by pt. Verified patient's name and date of birth. CC asked pt if she had time to speak w/ CC at the moment. Pt explained it was not a good time to speak because she is currently moving to a homeless shelter. As CC was asking pt if she's in need of housing assistance, pt disconnected call. CC will try to reach patient again 07/21/2022.    No additional chart review available at this time.      Assessed barriers to medication adherence:  []  Unable to refill (needs prescription(s) sent to outpatient pharmacy etc.)  []  Does not remember to take medications  []  Confusion or fears about the medication  []  Expenses/High Cost of Medications  []  Medication side effects   []  Difficulty with medication administration  []  Difficulty reading bottle labels  []  N/A - Patient reports compliance. Medication has been recently filled and patient is taking as prescribed. Last Fill Date:  []  N/A - Patient is no longer  on this medication.    Addressed barriers to medication adherence:       []  Discussed the importance and benefits of their medication regimen       [] Provided education and resources:        []  Contacted the patients pharmacy/medical prescriber for refills       []  Patient transitioned to mail order pharmacy?       []  Referred to Clinical Pharmacy Specialist for medication management    Patient verbalized understanding and appreciative of recommendations.      FOLLOW UP RECOMMENDATION/PLAN:     Attempt # 3 to reach the patient has been scheduled by Care Coordinator on 07/21/2022.    Mary [name], [place] Coordinator, Pharmacy  [facility]  [phone]  [email or web address]  [place], [place]    Electronically signed by Mary [name] at 07/20/2022  4:31 PM EST

## 2022-07-21 NOTE — Progress Notes (Signed)
Formatting of this note might be different from the original.  Images from the original note were not included.  [facility] Management - Quality Opportunity - Medication Adherence or Statin Utilization    07/20/2022    Carlynn Spry  14-May-1966  PCP: Vincenza Hews, MD  Date of last PCP Visit:   Not Available  Upcoming PCP Visit:   Not Available    Insurance Information: [facility] [place]    Patient has been identified by: [facility] MA for the following quality opportunity DM Rx and STATIN.    Per electronic claims data, JARDIANCE was last filled on 07/04/2022 for a 14-day supply, refill due 07/18/2022.     Pharmacy:     Per electronic claims data, SIMVASTATIN was last filled on 06/23/2022 for a 30-day supply, refill due 07/23/2022. (Proactive medication review)    Pharmacy: [facility] [phone]     INTERVENTIONS (select all that apply)    Medication Adherence    '[x]'$  Contact attempt made to patient during this encounter: Yes - Attempt #3 - Patient      CC called and spoke w/ pt. Call answered by pt. Verified patient's name and date of birth. CC asked pt if she had time to speak. Pt stated she is currently in a homeless shelter in [facility]. Pt explained her Life Coach drove her from [place] to [employer]. Pt unsure of her exact location. CC asked pt if she has been in contact w/ her care manager who can assist with housing concerns. Pt stated her Life Coach is "all she has" at this time. Pt's family and friends reside in [place], [place]. [place] asked pt would it be ok to reach out to one of our population health social workers to assist w/ pt's needs. Pt agreed.     CC sent referral to Merleen Nicely, MSW requesting housing and behavioral health assistance. Upon chart review, CC noted recent ED admission w/ a diagnosis concerning pt's behavioral health. Referral marked as high priority/urgent. Due to current circumstances, pt does not have transportation to pharmacy nor an  address to ship/deliver med. CC waiting for response from MSW.     No additional chart review available at this time.      Assessed barriers to medication adherence:  '[x]'$  Unable to refill   '[]'$  Does not remember to take medications  '[]'$  Confusion or fears about the medication  '[]'$  Expenses/High Cost of Medications  '[]'$  Medication side effects   '[]'$  Difficulty with medication administration  '[]'$  Difficulty reading bottle labels  '[]'$  N/A - Patient reports compliance. Medication has been recently filled and patient is taking as prescribed. Last Fill Date:  '[]'$  N/A - Patient is no longer on this medication.    Addressed barriers to medication adherence:       '[]'$  Discussed the importance and benefits of their medication regimen       '[]'$ Provided education and resources:        '[]'$  Contacted the patients pharmacy/medical prescriber for refills       '[]'$  Patient transitioned to mail order pharmacy?       '[x]'$  Referred to MSW for housing and behavioral health assistance.    Patient verbalized understanding and appreciative of recommendations.      FOLLOW UP RECOMMENDATION/PLAN:     CC will f/u w/ MSW ASAP. CC scheduled f/u w/ pt 07/22/2022.     Marta Lamas, [place] Coordinator, Pharmacy  [facility]  [phone]  [email or web address]  [place], [place]    Electronically signed by Marta Lamas at 07/21/2022  2:57 PM EST

## 2022-07-29 LAB — PROTIME-INR: INR: 2.2

## 2022-07-29 NOTE — Patient Instructions (Addendum)
Left voice message for patient with instructions.    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  Her INR goal is 2.5-3.5 and her current Coumadin dose is 5 mg alternating 2.5 mg daily.     Today's findings include an INR of 2.2     Considering Ms. [name] past history, todays findings, and per the coumadin policy/protocol, Ms. [name] was instructed to take Coumadin as follows,  2.5 mg tonight then 5 mg daily.  She was also instructed to come back in 1 weeks for an INR check.    A full discussion of the nature of anticoagulants has been carried out.  A full discussion of the need for frequent and regular monitoring, precise dosage adjustment and compliance was stressed.  Side effects of potential bleeding were discussed and Ms. [name] was instructed to call [phone] if there are any signs of abnormal bleeding.  Ms. [name] was instructed to avoid any OTC items containing aspirin or ibuprofen and prior to starting any new OTC products to consult with her physician or pharmacist to ensure no drug interactions are present.  Ms. [name] was instructed to avoid any major changes in her general diet and to avoid alcohol consumption.  .      Ms. [name] verbalized her understanding of all instructions and will call the office with any questions, concerns, or signs of abnormal bleeding or blood clot.

## 2022-08-02 ENCOUNTER — Ambulatory Visit: Payer: MEDICARE | Primary: Family Medicine

## 2022-08-05 ENCOUNTER — Ambulatory Visit

## 2022-08-05 NOTE — Progress Notes (Signed)
Interventional Radiology      This note was created 08/05/22 for clinic consultation that was not completed due to patient preference. IR will remain available as needed. The patient was given our clinic number to call back if she wishes to pursue kyphoplasty.     Denton Lank, IR PA

## 2022-08-09 NOTE — Telephone Encounter (Signed)
Attempted to contact the pt regarding her message. She was not able to be reached. A message was left for the pt letting her know that she will need to have an appt in order to get a refill on her medication. She has not been seen since November 2023. The visit can be virtual with the NP. The number to the office was provided.

## 2022-08-09 NOTE — Telephone Encounter (Signed)
Patient called to request a refill of   pregabalin (LYRICA) 200 MG capsule  be sent to the [facility] on [address] in [place]. Pharmacy updated in chart.    Please advise patient when sent, [phone]

## 2022-08-11 ENCOUNTER — Telehealth: Admit: 2022-08-11 | Discharge: 2022-08-11 | Payer: MEDICARE | Attending: Nurse Practitioner | Primary: Family Medicine

## 2022-08-11 DIAGNOSIS — M546 Pain in thoracic spine: Secondary | ICD-10-CM

## 2022-08-11 MED ORDER — PREGABALIN 200 MG PO CAPS
200 | ORAL_CAPSULE | Freq: Three times a day (TID) | ORAL | 5 refills | Status: AC
Start: 2022-08-11 — End: 2023-02-07

## 2022-08-11 NOTE — Progress Notes (Signed)
Make multiple attempts to contact patient regarding her INR, which was read at >5.0. Multiple attempts to contact. Patient failed. Phone service would not allow me to leave a message. Try again later tonight.

## 2022-08-11 NOTE — Progress Notes (Signed)
Mary [name] is a 57 y.o. female evaluated via telephone on 08/11/2022 for Back Pain (Mid back )  .      History of Present Illness: The patient is a 57 year old female who has chronic pain syndrome and currently has a T9 compression fracture.  We have been trying to get her in with IR for kyphoplasty of which she has had before at different levels.  Unfortunately she was positive for cocaine and the kyphoplasty had to be rescheduled.  She was admitted behavioral [facility] for a while.  She is now out but is homeless and is currently staying with a friend.  She states she still has a pain in the thoracic spine and that the Lyrica 200 mg 3 times a day does help.  She states she is scheduled for a telephone visit with IR on April 11.  This is in hopes of getting the kyphoplasty set up.    Physical Exam: The patient is a 57 year old female who is alert and oriented.  Spoke fluency.  She did not appear to be in distress.      Assessment/Plan: This is a patient has a T9 compression fracture.  She has had multiple ones in the past including T6-7 and 8.  I went ahead and refilled her Lyrica.  She will keep her follow-up appointment with IR next month and hopefully get scheduled for the kyphoplasty.    Mary [name] was seen today for back pain.    Diagnoses and all orders for this visit:    Chronic bilateral thoracic back pain  -     pregabalin (LYRICA) 200 MG capsule; Take 1 capsule by mouth 3 times daily for 180 days. Max Daily Amount: 600 mg          Documentation:  I communicated with the patient and/or health care decision maker about thoracic pain.   Details of this discussion including any medical advice provided: Yes    Total Time: minutes: 11-20 minutes    Mary [name] was evaluated through a synchronous (real-time) audio encounter. Patient identification was verified at the start of the visit. She (or guardian if applicable) is aware that this is a billable service, which includes applicable co-pays. This visit was  conducted with the patient's (and/or legal guardian's) verbal consent. She has not had a related appointment within my department in the past 7 days or scheduled within the next 24 hours.   The patient was located at Home: [place] [id number].  The provider was located at [facility] (Appt Dept): [address]  [address]  [place] [address].    I am licensed in the state of [place]    Note: not billable if this call serves to triage the patient into an appointment for the relevant concern    Mary Glassing, APRN - NP

## 2022-08-11 NOTE — Progress Notes (Signed)
Quartney Winklepleck presents today for   Chief Complaint   Patient presents with    Back Pain     Mid back        Is someone accompanying this pt? no    Is the patient using any DME equipment during OV? no    Coordination of Care:  1. Have you been to the ER, urgent care clinic since your last visit? no  Hospitalized since your last visit? no    2. Have you seen or consulted any other health care providers outside of the [facility] since your last visit? no Include any pap smears or colon screening. no

## 2022-08-11 NOTE — Progress Notes (Signed)
Formatting of this note might be different from the original.  Images from the original note were not included.  INITIAL ASSESSMENT ATTEMPT #1    MSW called patient to complete initial assessment, but no answer.  MSW left a HIPAA compliant voicemail with contact information and will await return call.      No return call before end of the day.  MSW will schedule initial assessment call second attempt.         Deborra Medina, MSW, MBA  [facility] Post Acute Social Worker  Phone:  [phone]      Electronically signed by Deborra Medina, MSW at 08/11/2022  2:55 PM EDT

## 2022-08-12 ENCOUNTER — Ambulatory Visit: Payer: MEDICAID | Attending: Cardiovascular Disease | Primary: Family Medicine

## 2022-08-12 LAB — PROTIME-INR: INR: 5.4

## 2022-08-12 NOTE — Patient Instructions (Signed)
Left patient instruction on voicemail    [name] is here today for anticoagulation monitoring for the diagnosis of atrial fibrillation.  Her INR goal is 2.5-3.5 and her current Coumadin dose is 5 mg daily.     Today's findings include an INR of 5.4     Considering [name] past history, todays findings, and per the coumadin policy/protocol, [name] was instructed to take Coumadin as follows,  hold tonight;then resume 5 mg daily.  She was also instructed to come back in 1 weeks for an INR check.    A full discussion of the nature of anticoagulants has been carried out.  A full discussion of the need for frequent and regular monitoring, precise dosage adjustment and compliance was stressed.  Side effects of potential bleeding were discussed and [name] was instructed to call [phone] if there are any signs of abnormal bleeding.  [name] was instructed to avoid any OTC items containing aspirin or ibuprofen and prior to starting any new OTC products to consult with her physician or pharmacist to ensure no drug interactions are present.  [name] was instructed to avoid any major changes in her general diet and to avoid alcohol consumption.  .      [name] verbalized her understanding of all instructions and will call the office with any questions, concerns, or signs of abnormal bleeding or blood clot.

## 2022-08-15 NOTE — Telephone Encounter (Signed)
Called patient identified by two verifiers. Explained   The provider is out the country. Please seek urgent care or PCP until provider returns. Patient stated she will go to urgent care. Directed to tell patient by clinical manager.

## 2022-08-15 NOTE — Telephone Encounter (Signed)
Patient called today stating she is in severe pain in her back where her fx is. States she is normally fine with tylenol and the Lyrica we prescribe but the past two days nothing seems to be working. She is asking for a call back to discuss.    Tel. [phone]    Patient states she has a virtual visit at 9:45 but any other time should be fine for a phone call.

## 2022-08-16 NOTE — Telephone Encounter (Signed)
Patient called to advised she will no longer be a patient at this location due to "no one is willing to help her". "She stated she didn't have transportation to go too urgent care or to see her primary care doctor.

## 2022-08-17 NOTE — Telephone Encounter (Addendum)
Under the circumstances, if patient feels the need to go to urgent care then that would be appropriate.

## 2022-09-22 ENCOUNTER — Encounter

## 2022-09-23 NOTE — Telephone Encounter (Addendum)
Patient called concerning the medication she had requested, I advised patient what Dr Wilford Corner stated and she hung up.

## 2022-09-23 NOTE — Telephone Encounter (Signed)
Patient called to advise that she is still in [place], [place], and due to come back via train on 5/18.  She states she was hospitalized while there and discharged herself early.  She has not been able to get a pending bone study done and says her pain is unbearable.  She has her Lyrica refill and is now asking for a different medication for the pain until she can return to the area and be seen again.  She's requesting a call back to discuss all the recent events and to see what we may be able to do to accommodate her while still in [place].  Please contact patient at [phone].

## 2022-09-23 NOTE — Telephone Encounter (Signed)
I tried to contact the pt. She was not able to be reached. A message was left for the pt letting her know that the provider will not be able to do any kind of medication change without an appt. We will not be able to do a telephone visit because [place] is in another state. I explained to the pt that she will have to wait until she is back in the state and then we can try to get her an appt at the office with either the nurse practitioner or Dr. Wilford Corner. The pt was reminded that she can always go to her nearest urgent care or ER if she feels that this is necessary for her care. The number to the office was provided in case she has any questions or concerns.

## 2022-10-07 MED ORDER — METOPROLOL SUCCINATE ER 50 MG PO TB24
50 MG | ORAL_TABLET | Freq: Every day | ORAL | 0 refills | Status: DC
Start: 2022-10-07 — End: 2022-11-04

## 2022-11-04 MED ORDER — METOPROLOL SUCCINATE ER 50 MG PO TB24
50 MG | ORAL_TABLET | Freq: Every day | ORAL | 1 refills | Status: AC
Start: 2022-11-04 — End: ?

## 2022-12-28 NOTE — Telephone Encounter (Signed)
PA letter scanned into chart.

## 2023-02-11 ENCOUNTER — Encounter

## 2023-02-22 ENCOUNTER — Ambulatory Visit: Payer: MEDICAID | Attending: Cardiovascular Disease | Primary: Family Medicine

## 2023-02-22 NOTE — Progress Notes (Deleted)
HISTORY OF PRESENT ILLNESS  Mary [name] is a 57 y.o. female.    PMH History of Mitral valve replacement Bioprosthetic, CHF, Bipolar, Anxiety, Left atrial clip,   -----  CARDIAC STUDIES  ----  TEE 09/05/2022  Left Ventricle: Systolic function is low normal. EF: 50-55%.     Aortic Valve: The aortic valve is tricuspid. There is mild sclerosis.     Aortic Valve: Mild to moderate aortic valve regurgitation with   centrally directed jet.     Mitral Valve: There is a bioprosthetic valve.     Mitral Valve: The prosthetic valve appears to be well-seated without   rocking. Valve leaflets are mildly thickened, motion is normal.     Mitral Valve: Prosthetic valve gradients are moderately elevated, with   a mean gradient of 8.000 mmHg. P1/2T = 109 ms at heart rate 70.  MVA =   1.900 cm2. No vegetation noted on the prosthetic MV.     Tricuspid Valve: There is mild to moderate regurgitation.     Tricuspid Valve: The right ventricular systolic pressure is moderately   elevated (50-59 mmHg).     Pericardium: There is no pericardial effusion.     The previously noted small mobile density on the prosthetic mitral valve   is no longer seen.   The mitral stenosis now is moderate in severity.   ----  LHC 08/25/2022  Mild pulmonary hypertension with normal filling pressures, taking into   account bioprosthetic mitral valve with mild elevated gradients (PASP 41   mmHg, mean PA 25 mmHg, mean PCWP 18 mmHg).   RVSP 39 mmHg, RVEDP 0.   Normal Fick cardiac output of 4.2 L/min and Fick cardiac index of 2.4   L/min/m.     Recommendations:   Continue medical Rx and follow-up on TEE results.   Okay to resume IV heparin infusion.   ----  TEE 08/26/2022  Low normal LVEF, 50 to 55% with no clear regional wall motion   abnormalities.   RV appears normal in size and function.   Bioprosthetic valve noted in mitral position, well-seated with no rocking   motion and with mildly thickened leaflets with good excursion noted.    Small mobile echodensity noted  at the septal leaflet, consistent with   vegetation.  Mean gradient 10 mmHg or heart rate of 67 bpm.  No   significant MR.   Trileaflet aortic valve with no significant abnormal findings.  Mild AR.    No vegetation noted on aortic valve.   Mild to moderate TR.  No vegetation noted on tricuspid valve.   Mild biatrial dilation.     Findings discussed with primary team.   ----  2d echo 08/18/2022  Mean gradient 12 mmHg at a heart rate of 85 bpm.  No significant   peri or paravalvular MR.   Mild to moderate TR with mild pulmonary hypertension (RVSP around 40   mmHg).   Mildly sclerotic aortic valve leaflets with no evidence of aortic   stenosis.  Suspect elevated gradients across aortic valve to be   subvalvular in nature.   Mildly dilated LA.   No pericardial effusion.   Compared to prior echocardiogram March 2019, gradients across   bioprosthetic mitral valve have increased significantly.  Recommend TEE to   evaluate if clinically indicated.   ----  2d echo 05/06/2022    Left Ventricle: Low normal left ventricular systolic function with a visually estimated EF of 50 - 55%. Left ventricle is dilated. Normal  wall thickness. Normal wall motion.    Aortic Valve: Mild annular calcification. Moderate regurgitation. Mild stenosis of the aortic valve. AV mean gradient is 9 mmHg. AV area by continuity VTI is 1.7 cm2.    Mitral Valve: Bioprosthetic valve. MV mean gradient is 7 mmHg. Moderate stenosis noted. Elevated prosthetic gradient.    Tricuspid Valve: Moderate regurgitation. The estimated RVSP is 52 mmHg.    Left Atrium: Left atrium is dilated.    Pulmonary Arteries: Moderate pulmonary hypertension present.    Image quality is fair. Limited study due to patient's ability to tolerate test.  ----  2d tte 08/06/2021    Left Ventricle: Normal left ventricular systolic function with a visually estimated EF of 55 - 60%. Left ventricle is mildly dilated. Increased wall thickness. See diagram for wall motion findings. Diastolic  function present with increased LAP with normal LV EF.    Right Ventricle: Right ventricle is mildly dilated. RVIDd is 4.3 cm.    Aortic Valve: Moderate regurgitation with a centrally directed jet. Vena contracta measures 0.5 cm.    Mitral Valve: Bioprosthetic valve that is well-seated with normal leaflet motion. MV mean gradient is 7 mmHg. Moderate regurgitation.    Left Atrium: Left atrium is mildly dilated. Left atrial volume index is mildly increased (35-41 mL/m2). LA Vol Index A/L is 38 mL/m2.    Pulmonary Arteries: Pulmonary hypertension not present. The estimated PASP is 30 mmHg.  -----  LHC 04/2020  Mild single vessel coronary artery disease with preserved LV function.  Elevated LVEDP.  Recommend intense risk factor modification.  -----  Vascular duplex upper extremity venous left   Acute non-occlusive deep vein thrombosis in 1 of 2 brachial veins within the left upper extremity.   Superficial acute non-occlusive thrombosis in the cephalic and basilic veins within the left upper extremity.  ----  Nuclear stress 08/2021  Low Risk Stress Test  No significant resting or reversible defects present  Normal ejection fraction 74% without significant TID 0.88  SSS = 0, SRS = 2, SDS = -2  ----  Cardiac telemetry 11/12/2021  Patient monitored for 14d 12h starting on 09/25/2021 09:59 am.     Primary rhythm was Sinus Rhythm. Average heart rate was 62 bpm, Minimum heart rate was 43 bpm on Day 11  / 08:06:09 am, Max heart rate was 139 bpm on Day 7 / 04:57:50 pm     Atrial Fibrillation or Flutter: Burden was 0 %, longest event 0 ms on --, fastest rate -- bpm on --.     SVE(s): Burden was 0.06 %, max count per 24 hours 56  SVT (AT, RT): 43 events, longest event 11 beats on Day 5 / 05:09:52 am, fastest event 169 bpm on Day 1 /  04:48:18 pm     Pause: 1 events, longest pause 2000 ms on Day 11 / 08:06:15 am  AV Block: 0 %, most severe block demonstrated --     PVC(s): Burden was < 0.01 %, max count per 24 hours 3, 2 disparate  morphologies  Ventricular Tachycardia: 0 events, longest event 0 s at --, fastest rate -- bpm at  ----  [facility] 08/27/2015  1. Essentially normal coronary arteries with minor luminal   irregularities of mid RCA and otherwise normal coronaries.   2. Normal LV end diastolic pressure at 14 mmHg.  ----  LHC 04/27/2020  Mild single vessel coronary artery disease with preserved LV function.  Elevated LVEDP.  Recommend intense risk factor modification.   ----  Patient recorded 1 events during the monitoring period Occurred with sinus bradycardia and artifact 58bpm  ----    Have you had Fatigue?  No      2.   Have you had have you had Chest Pain? Reported some chest pain minutes, atypical, as before  3.   Have you had Dyspnea (SOB) ? No   4.   Have you had Orthopnea? No      5.   Have you had PND? No   6.   Have you had leg swelling? No   7.    Have you had any weight gain? No     8. Have you had any palpitations? No      9. Have you had any syncope? No   10. Do you have any wounds on legs? no      Reviewed with the patient and is as such.        Family History   Problem Relation Age of Onset    No Known Problems Mother        Past Medical History:   Diagnosis Date    Anxiety     Bipolar 1 disorder (HCC)     CHF (congestive heart failure) (HCC)     COPD (chronic obstructive pulmonary disease) (HCC)     DDD (degenerative disc disease), cervical     GERD (gastroesophageal reflux disease)     H/O aortic valve replacement     History of transient ischemic attack (TIA)     Mitral regurgitation and mitral stenosis     Polysubstance abuse (HCC) 06/2022    cocoaine, opioid, and THC+ in ED UDS    PTSD (post-traumatic stress disorder)     Scoliosis     Vitamin D deficiency        Past Surgical History:   Procedure Laterality Date    CARDIAC CATHETERIZATION      CARDIAC VALVE SURGERY      mitral valve replacement    CESAREAN SECTION      COLONOSCOPY      HAND/FINGER SURGERY UNLISTED Right     I&D right middle finger    HYSTERECTOMY (CERVIX  STATUS UNKNOWN)      IR KYPHOPLASTY THORACIC FIRST LEVEL  03/03/2022    IR KYPHOPLASTY THORACIC FIRST LEVEL 03/03/2022 Samoilov, Dmitri E, MD [facility] RAD ANGIO IR    KNEE SURGERY      OVARY REMOVAL         Social History     Tobacco Use    Smoking status: Every Day     Current packs/day: 0.75     Types: Cigarettes     Passive exposure: Current    Smokeless tobacco: Never   Substance Use Topics    Alcohol use: Never       Allergies   Allergen Reactions    Lithium Nausea And Vomiting     Vomiting      Alprazolam Other (See Comments)     Gets suicidal    Dermatitis Antigen      Rash and blisers  Rash and blisers    Diazepam Other (See Comments)     Gets suicidal    Levofloxacin Other (See Comments)     Muscle stiffness and bulging muscles     Tobacco Other (See Comments)     Increasing lung deterioration     Adhesive Tape Rash     blisters       Prior to Admission medications  Medication Sig Start Date End Date Taking? Authorizing Provider   metoprolol succinate (TOPROL XL) 50 MG extended release tablet TAKE 1 TABLET BY MOUTH DAILY 11/04/22   Black, Myria G, APRN - NP   atorvastatin (LIPITOR) 10 MG tablet Take 1 tablet by mouth daily 07/27/22   [provider]   cetirizine (ZYRTEC) 10 MG tablet Take 1 tablet by mouth daily 07/27/22   [provider]   nitroGLYCERIN (NITROSTAT) 0.4 MG SL tablet Place 1 tablet under the tongue every 5 minutes as needed for Chest pain    [provider]   pregabalin (LYRICA) 200 MG capsule Take 1 capsule by mouth 3 times daily for 180 days. Max Daily Amount: 600 mg 08/11/22 02/07/23  Blount, Gerilyn Nestle, APRN - NP   hydrOXYzine pamoate (VISTARIL) 25 MG capsule  07/04/22   [provider]   OXcarbazepine (TRILEPTAL) 150 MG tablet Take 1 tablet by mouth 2 times daily 06/23/22   [provider]   prazosin (MINIPRESS) 2 MG capsule  07/04/22   [provider]   simvastatin (ZOCOR) 20 MG tablet  06/23/22   [provider]   baclofen (LIORESAL) 10 MG  tablet Take 1 tablet by mouth as needed    [provider]   warfarin (COUMADIN) 2.5 MG tablet Take 3 tablets by mouth daily 05/25/22   Sandre Kitty, MD   albuterol sulfate HFA (PROVENTIL;VENTOLIN;PROAIR) 108 (90 Base) MCG/ACT inhaler Inhale 2 puffs into the lungs every 6 hours as needed for Wheezing  Patient not taking: Reported on 07/19/2022 05/23/22   Gerre Scull, MD   traZODone (DESYREL) 150 MG tablet Take 0.5 tablets by mouth nightly 05/23/22   Gerre Scull, MD   empagliflozin (JARDIANCE) 10 MG tablet Take 1 tablet by mouth daily 05/24/22   Gerre Scull, MD   lurasidone (LATUDA) 40 MG TABS tablet Take 1 tablet by mouth Daily with supper  Patient taking differently: Take 1.5 tablets by mouth Daily with supper 05/23/22   Gerre Scull, MD   fluticasone Aleda Grana) 50 MCG/ACT nasal spray 1 spray by Each Nostril route daily 05/24/22   Gerre Scull, MD   lidocaine 4 % external patch Place 1 patch onto the skin daily as needed (Mild Pain) 05/23/22   Gerre Scull, MD   docusate (COLACE, DULCOLAX) 100 MG CAPS Take 100 mg by mouth daily  Patient not taking: Reported on 07/19/2022 05/23/22   Gerre Scull, MD   omeprazole (PRILOSEC) 40 MG delayed release capsule Take 1 capsule by mouth daily 05/08/22   Strong, Chrissie Noa, MD   TRELEGY ELLIPTA 100-62.5-25 MCG/ACT AEPB inhaler inhale 1 puff by mouth and INTO THE LUNGS once daily 03/17/22   [provider]   Ubrogepant (UBRELVY) 100 MG TABS Take 100 mg by mouth as needed (Migraine) Take 100 mg at onset of headache; can repeat in 2 hours if no change; not to be taken more than 2 days a week. 12/10/21   Vernie Murders, MD   vitamin B-12 (CYANOCOBALAMIN) 1000 MCG tablet Take 1 tablet by mouth daily    [provider]   aspirin 81 MG chewable tablet Take 1 tablet by mouth daily    Automatic Reconciliation, Ar   furosemide (LASIX) 40 MG tablet Take 1 tablet by mouth daily as needed    Automatic Reconciliation, Ar       No results found for:  "LIPIDPAN", "BMP", "CMP"     There were  no vitals taken for this visit.    HPI    Review of Systems   Constitutional:  Negative for activity change, appetite change, diaphoresis, fatigue and unexpected weight change.   Eyes:  Negative for visual disturbance.   Respiratory:  Negative for apnea, cough, chest tightness, shortness of breath and wheezing.    Cardiovascular:  Negative for chest pain, palpitations and leg swelling.   Gastrointestinal:  Negative for abdominal pain, blood in stool, constipation, diarrhea and nausea.   Endocrine: Negative for cold intolerance and heat intolerance.   Genitourinary:  Negative for decreased urine volume and difficulty urinating.   Musculoskeletal:  Negative for gait problem, myalgias and neck pain.   Skin:  Negative for color change, pallor and rash.   Neurological:  Negative for dizziness, syncope, facial asymmetry, speech difficulty, weakness, light-headedness and numbness.   Psychiatric/Behavioral:  Negative for confusion and sleep disturbance.          Physical Exam  Vitals reviewed.   Constitutional:       General: She is not in acute distress.     Appearance: Normal appearance. She is not ill-appearing or diaphoretic.   HENT:      Head: Normocephalic and atraumatic.   Eyes:      General: No scleral icterus.        Right eye: No discharge.         Left eye: No discharge.      Conjunctiva/sclera: Conjunctivae normal.   Neck:      Vascular: No carotid bruit.   Cardiovascular:      Rate and Rhythm: Normal rate and regular rhythm.      Heart sounds: Normal heart sounds. No murmur heard.     No friction rub. No gallop.   Pulmonary:      Effort: Pulmonary effort is normal. No respiratory distress.      Breath sounds: Normal breath sounds. No wheezing, rhonchi or rales.   Chest:      Chest wall: No tenderness.   Abdominal:      General: Bowel sounds are normal. There is no distension.      Palpations: Abdomen is soft.      Tenderness: There is no abdominal tenderness. There is no  guarding.   Musculoskeletal:         General: No swelling or tenderness.      Cervical back: Neck supple.      Right lower leg: No edema.      Left lower leg: No edema.   Skin:     General: Skin is warm and dry.      Findings: No erythema or rash.   Neurological:      Mental Status: She is alert. Mental status is at baseline.      Motor: No weakness.      Gait: Gait normal.   Psychiatric:         Mood and Affect: Mood normal.         Behavior: Behavior normal.         Thought Content: Thought content normal.         ASSESSMENT and PLAN  Ms. Kruizenga has a reminder for a "due or due soon" health maintenance. I have asked that she contact her primary care provider for follow-up on this health maintenance.     Chronic heart failure preserved ejection fraction - Monitor fluid status, adjust as necessary, fluid restrication 2L/day, salt intake <2g per day. Currently on jardiance 10mg  po  daily as needed weight gain 2 to 3 pounds     Chest pain -   Low risk stress test 08/2021 but with continued smoking, sent Nitro PRN. Patient advised that if increasing frequency duration and intensity of discomfort or unrelieved discomfort to call EMS to visit emergency department.  Check Cardiac CTA evaluate for obstructive CAD with disease accelerating factors.  If increasing frequency duration intensity to visit emergency department.       Moderate aortic regurgitation - Survey periodically 1-2 years.  2d echo 04/2022.      Mitral valve bioprosthetic with mean gradient 7 mmHg 08/02/2021 - Survey periodically 1-2 years.  2d echo 04/2022.      HLD - Continue pravastatin 40mg  po daily, survey lipid panel periodically.  Lipid panel 04/19/2022, LDL 155mg /dL, HDL 97 mg/dL    Atrial fibrillation - On coumadin, requests refill, INR 2-3 Check for primary prevention of stroke.  Refill.  Metoprolol succinate 50mg  po daily.       HTN - Stage I pressure, metoprolol succinate 50mg  po daily,

## 2023-11-10 ENCOUNTER — Encounter (HOSPITAL_COMMUNITY): Payer: Self-pay | Admitting: Interventional Radiology
